# Patient Record
Sex: Female | Born: 1940 | Race: White | Hispanic: No | Marital: Married
Health system: Northeastern US, Academic
[De-identification: ages and names within clinical notes are randomized; demographics above are authoritative.]

## PROBLEM LIST (undated history)

## (undated) DIAGNOSIS — M549 Dorsalgia, unspecified: Secondary | ICD-10-CM

## (undated) DIAGNOSIS — M48061 Spinal stenosis, lumbar region without neurogenic claudication: Secondary | ICD-10-CM

## (undated) DIAGNOSIS — E210 Primary hyperparathyroidism: Secondary | ICD-10-CM

## (undated) DIAGNOSIS — K623 Rectal prolapse: Secondary | ICD-10-CM

## (undated) DIAGNOSIS — N200 Calculus of kidney: Secondary | ICD-10-CM

## (undated) DIAGNOSIS — N390 Urinary tract infection, site not specified: Secondary | ICD-10-CM

## (undated) DIAGNOSIS — K219 Gastro-esophageal reflux disease without esophagitis: Secondary | ICD-10-CM

## (undated) DIAGNOSIS — F329 Major depressive disorder, single episode, unspecified: Secondary | ICD-10-CM

## (undated) DIAGNOSIS — R079 Chest pain, unspecified: Secondary | ICD-10-CM

## (undated) DIAGNOSIS — R262 Difficulty in walking, not elsewhere classified: Secondary | ICD-10-CM

## (undated) DIAGNOSIS — F32A Depression, unspecified: Secondary | ICD-10-CM

## (undated) DIAGNOSIS — T4145XA Adverse effect of unspecified anesthetic, initial encounter: Secondary | ICD-10-CM

## (undated) DIAGNOSIS — T402X5A Adverse effect of other opioids, initial encounter: Secondary | ICD-10-CM

## (undated) DIAGNOSIS — R634 Abnormal weight loss: Secondary | ICD-10-CM

## (undated) DIAGNOSIS — K5903 Drug induced constipation: Secondary | ICD-10-CM

## (undated) DIAGNOSIS — G4700 Insomnia, unspecified: Secondary | ICD-10-CM

## (undated) DIAGNOSIS — R52 Pain, unspecified: Secondary | ICD-10-CM

## (undated) DIAGNOSIS — H269 Unspecified cataract: Secondary | ICD-10-CM

## (undated) DIAGNOSIS — M6281 Muscle weakness (generalized): Secondary | ICD-10-CM

## (undated) DIAGNOSIS — R064 Hyperventilation: Secondary | ICD-10-CM

## (undated) DIAGNOSIS — R0600 Dyspnea, unspecified: Secondary | ICD-10-CM

## (undated) DIAGNOSIS — F419 Anxiety disorder, unspecified: Secondary | ICD-10-CM

## (undated) DIAGNOSIS — M170 Bilateral primary osteoarthritis of knee: Secondary | ICD-10-CM

## (undated) DIAGNOSIS — R1310 Dysphagia, unspecified: Secondary | ICD-10-CM

## (undated) DIAGNOSIS — I499 Cardiac arrhythmia, unspecified: Secondary | ICD-10-CM

## (undated) DIAGNOSIS — Z87442 Personal history of urinary calculi: Secondary | ICD-10-CM

## (undated) DIAGNOSIS — I719 Aortic aneurysm of unspecified site, without rupture: Secondary | ICD-10-CM

## (undated) DIAGNOSIS — T8859XA Other complications of anesthesia, initial encounter: Secondary | ICD-10-CM

## (undated) HISTORY — DX: Unspecified cataract: H26.9

## (undated) HISTORY — DX: Insomnia, unspecified: G47.00

## (undated) HISTORY — PX: PARATHYROIDECTOMY: SHX19

## (undated) HISTORY — PX: TONSILLECTOMY: SUR1361

## (undated) HISTORY — DX: Calculus of kidney: N20.0

## (undated) HISTORY — PX: ABDOMINAL HYSTERECTOMY: SHX81

## (undated) HISTORY — DX: Muscle weakness (generalized): M62.81

## (undated) HISTORY — PX: JOINT REPLACEMENT: SHX530

## (undated) HISTORY — DX: Dysphagia, unspecified: R13.10

## (undated) HISTORY — DX: Depression, unspecified: F32.A

## (undated) HISTORY — PX: APPENDECTOMY: SHX54

## (undated) HISTORY — PX: TOTAL KNEE ARTHROPLASTY: SHX125

## (undated) HISTORY — DX: Dyspnea, unspecified: R06.00

## (undated) HISTORY — DX: Urinary tract infection, site not specified: N39.0

## (undated) HISTORY — DX: Anxiety disorder, unspecified: F41.9

## (undated) HISTORY — DX: Pain, unspecified: R52

## (undated) HISTORY — PX: SPINE SURGERY: SHX786

## (undated) HISTORY — DX: Dorsalgia, unspecified: M54.9

## (undated) HISTORY — PX: BREAST LUMPECTOMY: SHX2

## (undated) HISTORY — DX: Chest pain, unspecified: R07.9

## (undated) HISTORY — DX: Primary hyperparathyroidism: E21.0

## (undated) HISTORY — DX: Major depressive disorder, single episode, unspecified: F32.9

## (undated) HISTORY — DX: Spinal stenosis, lumbar region without neurogenic claudication: M48.061

## (undated) HISTORY — DX: Difficulty in walking, not elsewhere classified: R26.2

## (undated) HISTORY — DX: Abnormal weight loss: R63.4

## (undated) HISTORY — DX: Hyperventilation: R06.4

## (undated) HISTORY — DX: Bilateral primary osteoarthritis of knee: M17.0

## (undated) HISTORY — DX: Aortic aneurysm of unspecified site, without rupture: I71.9

## (undated) HISTORY — DX: Rectal prolapse: K62.3

## (undated) HISTORY — PX: ROTATOR CUFF REPAIR: SHX139

---

## 2002-11-06 LAB — HM DEXA SCAN: HM Dexa Scan: NORMAL

## 2008-03-16 ENCOUNTER — Encounter: Admission: RE | Admit: 2008-03-16 | Discharge: 2008-03-16 | Payer: Self-pay | Admitting: Orthopaedic Surgery

## 2008-03-23 ENCOUNTER — Encounter: Admission: RE | Admit: 2008-03-23 | Discharge: 2008-03-23 | Payer: Self-pay | Admitting: Orthopaedic Surgery

## 2008-04-28 ENCOUNTER — Encounter: Admission: RE | Admit: 2008-04-28 | Discharge: 2008-04-28 | Payer: Self-pay | Admitting: Orthopaedic Surgery

## 2008-05-14 ENCOUNTER — Encounter: Admission: RE | Admit: 2008-05-14 | Discharge: 2008-05-14 | Payer: Self-pay | Admitting: Orthopaedic Surgery

## 2008-11-25 ENCOUNTER — Emergency Department (HOSPITAL_COMMUNITY): Admission: EM | Admit: 2008-11-25 | Discharge: 2008-11-25 | Payer: Self-pay | Admitting: Emergency Medicine

## 2009-02-16 ENCOUNTER — Encounter (HOSPITAL_COMMUNITY): Admission: RE | Admit: 2009-02-16 | Discharge: 2009-04-01 | Payer: Self-pay | Admitting: Sports Medicine

## 2009-04-12 ENCOUNTER — Encounter (HOSPITAL_COMMUNITY): Admission: RE | Admit: 2009-04-12 | Discharge: 2009-05-12 | Payer: Self-pay | Admitting: Sports Medicine

## 2009-09-10 ENCOUNTER — Emergency Department (HOSPITAL_COMMUNITY): Admission: EM | Admit: 2009-09-10 | Discharge: 2009-09-10 | Payer: Self-pay | Admitting: Emergency Medicine

## 2009-10-09 ENCOUNTER — Inpatient Hospital Stay: Payer: Self-pay | Admitting: Specialist

## 2010-01-04 ENCOUNTER — Ambulatory Visit: Payer: Self-pay | Admitting: Specialist

## 2011-01-20 ENCOUNTER — Emergency Department (HOSPITAL_COMMUNITY)
Admission: EM | Admit: 2011-01-20 | Discharge: 2011-01-21 | Disposition: A | Discharge status: Home / Self Care | Payer: Medicare Other | Attending: Emergency Medicine | Admitting: Emergency Medicine

## 2011-01-20 DIAGNOSIS — J40 Bronchitis, not specified as acute or chronic: Secondary | ICD-10-CM | POA: Insufficient documentation

## 2011-01-20 DIAGNOSIS — R05 Cough: Secondary | ICD-10-CM | POA: Insufficient documentation

## 2011-01-20 DIAGNOSIS — Z79899 Other long term (current) drug therapy: Secondary | ICD-10-CM | POA: Insufficient documentation

## 2011-01-20 DIAGNOSIS — R059 Cough, unspecified: Secondary | ICD-10-CM | POA: Insufficient documentation

## 2011-01-20 DIAGNOSIS — F329 Major depressive disorder, single episode, unspecified: Secondary | ICD-10-CM | POA: Insufficient documentation

## 2011-01-20 DIAGNOSIS — J329 Chronic sinusitis, unspecified: Secondary | ICD-10-CM | POA: Insufficient documentation

## 2011-01-20 DIAGNOSIS — F3289 Other specified depressive episodes: Secondary | ICD-10-CM | POA: Insufficient documentation

## 2011-01-20 DIAGNOSIS — J3489 Other specified disorders of nose and nasal sinuses: Secondary | ICD-10-CM | POA: Insufficient documentation

## 2011-02-08 LAB — OVA AND PARASITE EXAMINATION: Ova and parasites: NONE SEEN

## 2011-02-08 LAB — CBC
HCT: 39.1 % (ref 36.0–46.0)
Hemoglobin: 13.4 g/dL (ref 12.0–15.0)
MCHC: 34.2 g/dL (ref 30.0–36.0)
MCV: 97.1 fL (ref 78.0–100.0)
RBC: 4.02 MIL/uL (ref 3.87–5.11)
WBC: 6.6 10*3/uL (ref 4.0–10.5)

## 2011-02-08 LAB — DIFFERENTIAL
Basophils Absolute: 0 10*3/uL (ref 0.0–0.1)
Eosinophils Absolute: 0.2 10*3/uL (ref 0.0–0.7)
Eosinophils Relative: 3 % (ref 0–5)
Lymphocytes Relative: 34 % (ref 12–46)
Lymphs Abs: 2.3 10*3/uL (ref 0.7–4.0)
Neutrophils Relative %: 54 % (ref 43–77)

## 2011-02-08 LAB — URINALYSIS, ROUTINE W REFLEX MICROSCOPIC
Bilirubin Urine: NEGATIVE
Glucose, UA: NEGATIVE mg/dL
Hgb urine dipstick: NEGATIVE
Ketones, ur: NEGATIVE mg/dL
Protein, ur: NEGATIVE mg/dL
Urobilinogen, UA: 0.2 mg/dL (ref 0.0–1.0)

## 2011-02-08 LAB — COMPREHENSIVE METABOLIC PANEL
AST: 17 U/L (ref 0–37)
CO2: 27 mEq/L (ref 19–32)
Calcium: 8.8 mg/dL (ref 8.4–10.5)
Chloride: 108 mEq/L (ref 96–112)
Creatinine, Ser: 0.73 mg/dL (ref 0.4–1.2)
GFR calc non Af Amer: >60 mL/min (ref >60)
Glucose, Bld: 85 mg/dL (ref 70–99)
Total Bilirubin: 0.4 mg/dL (ref 0.3–1.2)

## 2011-02-08 LAB — CLOSTRIDIUM DIFFICILE EIA: C difficile Toxins A+B, EIA: NEGATIVE

## 2011-02-08 LAB — STOOL CULTURE

## 2011-02-08 LAB — URINE CULTURE

## 2011-11-07 HISTORY — PX: COLONOSCOPY: SHX174

## 2011-12-25 ENCOUNTER — Ambulatory Visit: Payer: Self-pay | Admitting: Unknown Physician Specialty

## 2011-12-25 LAB — HM COLONOSCOPY: HM Colonoscopy: NORMAL

## 2011-12-26 LAB — PATHOLOGY REPORT

## 2012-08-25 ENCOUNTER — Emergency Department (HOSPITAL_COMMUNITY): Payer: Medicare Other

## 2012-08-25 ENCOUNTER — Emergency Department (HOSPITAL_COMMUNITY)
Admission: EM | Admit: 2012-08-25 | Discharge: 2012-08-25 | Disposition: A | Discharge status: Home / Self Care | Payer: Medicare Other | Attending: Emergency Medicine | Admitting: Emergency Medicine

## 2012-08-25 ENCOUNTER — Encounter (HOSPITAL_COMMUNITY): Payer: Self-pay | Admitting: Emergency Medicine

## 2012-08-25 DIAGNOSIS — S61209A Unspecified open wound of unspecified finger without damage to nail, initial encounter: Secondary | ICD-10-CM | POA: Insufficient documentation

## 2012-08-25 DIAGNOSIS — S61259A Open bite of unspecified finger without damage to nail, initial encounter: Secondary | ICD-10-CM

## 2012-08-25 DIAGNOSIS — IMO0001 Reserved for inherently not codable concepts without codable children: Secondary | ICD-10-CM | POA: Insufficient documentation

## 2012-08-25 DIAGNOSIS — Y92009 Unspecified place in unspecified non-institutional (private) residence as the place of occurrence of the external cause: Secondary | ICD-10-CM | POA: Insufficient documentation

## 2012-08-25 DIAGNOSIS — Z23 Encounter for immunization: Secondary | ICD-10-CM | POA: Insufficient documentation

## 2012-08-25 MED ORDER — AMOXICILLIN-POT CLAVULANATE 875-125 MG PO TABS: 1.0000 | ORAL_TABLET | Freq: Two times a day (BID) | ORAL | Status: DC

## 2012-08-25 MED ORDER — AMOXICILLIN-POT CLAVULANATE 875-125 MG PO TABS
1.0000 | ORAL_TABLET | Freq: Once | ORAL | Status: AC
  Administered 2012-08-25: 1 via ORAL
  Filled 2012-08-25: qty 1

## 2012-08-25 MED ORDER — TETANUS-DIPHTH-ACELL PERTUSSIS 5-2.5-18.5 LF-MCG/0.5 IM SUSP
0.5000 mL | Freq: Once | INTRAMUSCULAR | Status: AC
  Administered 2012-08-25: 0.5 mL via INTRAMUSCULAR
  Filled 2012-08-25: qty 0.5

## 2012-08-25 NOTE — ED Notes (Signed)
Patient was attempting to catch some cats to take them to the pound.  Patient was bitten on right thumb.

## 2012-08-25 NOTE — ED Provider Notes (Signed)
History     CSN: [id number]  Arrival date & time 08/25/12  2012   First MD Initiated Contact with Patient 08/25/12 2020      Chief Complaint  Patient presents with  . Animal Bite    (Consider location/radiation/quality/duration/timing/severity/associated sxs/prior treatment) HPI Comments: Bit while capturing a feral cat that lives in her barn.  date of last dT unknown.  R hand dominant   Patient is a 71 y.o. female presenting with animal bite. The history is provided by the patient. No language interpreter was used.  Animal Bite  The incident occurred just prior to arrival. The incident occurred at home. She came to the ER via personal transport. There is an injury to the right thumb. The pain is moderate. It is unknown if a foreign body is present. Pertinent negatives include no numbness, no focal weakness, no tingling and no weakness. There have been no prior injuries to these areas. Her tetanus status is out of date. There were no sick contacts. She has received no recent medical care.    History reviewed. No pertinent past medical history.  History reviewed. No pertinent past surgical history.  No family history on file.  History  Substance Use Topics  . Smoking status: Current Every Day Smoker  . Smokeless tobacco: Not on file  . Alcohol Use: No    OB History    Grav Para Term Preterm Abortions TAB SAB Ect Mult Living                  Review of Systems  Constitutional: Negative for fever and chills.  Skin:       Puncture wound to thumb   Neurological: Negative for tingling, focal weakness, weakness and numbness.  All other systems reviewed and are negative.    Allergies  Codeine; Fentanyl; and Sodium pentobarbital  Home Medications  No current outpatient prescriptions on file.  BP 137/71  Pulse 99  Temp 98.1 F (36.7 C) (Oral)  Resp 18  Ht 5\' 1"  (1.549 m)  Wt 165 lb (74.844 kg)  BMI 31.18 kg/m2  SpO2 100%  Physical Exam  Nursing note and  vitals reviewed. Constitutional: She is oriented to person, place, and time. She appears well-developed and well-nourished. No distress.  HENT:  Head: Normocephalic and atraumatic.  Eyes: EOM are normal.  Neck: Normal range of motion.  Cardiovascular: Normal rate and regular rhythm.   Pulmonary/Chest: Effort normal.  Abdominal: Soft. She exhibits no distension. There is no tenderness.  Musculoskeletal: She exhibits tenderness.       Hands: Neurological: She is alert and oriented to person, place, and time.  Skin: Skin is warm and dry.  Psychiatric: She has a normal mood and affect. Judgment normal.    ED Course  Procedures (including critical care time)  Labs Reviewed - No data to display No results found.   1. Animal bite of finger       MDM  rx-augmentin 875 mg , 14 Wash/abxoint BID F/u with your MD        Evalina Field, PA 08/25/12 2141

## 2012-08-26 NOTE — ED Provider Notes (Signed)
Medical screening examination/treatment/procedure(s) were performed by non-physician practitioner and as supervising physician I was immediately available for consultation/collaboration.   Carleene Cooper III, MD 08/26/12 [phone]

## 2013-06-23 ENCOUNTER — Encounter: Payer: Self-pay | Admitting: Internal Medicine

## 2013-06-23 ENCOUNTER — Ambulatory Visit (INDEPENDENT_AMBULATORY_CARE_PROVIDER_SITE_OTHER): Payer: Medicare Other | Admitting: Internal Medicine

## 2013-06-23 VITALS — BP 152/88 | HR 76 | Temp 98.4°F | Resp 14 | Ht 61.5 in | Wt 164.0 lb

## 2013-06-23 DIAGNOSIS — G4700 Insomnia, unspecified: Secondary | ICD-10-CM | POA: Insufficient documentation

## 2013-06-23 DIAGNOSIS — R109 Unspecified abdominal pain: Secondary | ICD-10-CM

## 2013-06-23 DIAGNOSIS — F3289 Other specified depressive episodes: Secondary | ICD-10-CM

## 2013-06-23 DIAGNOSIS — E538 Deficiency of other specified B group vitamins: Secondary | ICD-10-CM

## 2013-06-23 DIAGNOSIS — F32A Depression, unspecified: Secondary | ICD-10-CM | POA: Insufficient documentation

## 2013-06-23 DIAGNOSIS — F329 Major depressive disorder, single episode, unspecified: Secondary | ICD-10-CM

## 2013-06-23 DIAGNOSIS — E669 Obesity, unspecified: Secondary | ICD-10-CM | POA: Insufficient documentation

## 2013-06-23 DIAGNOSIS — M48061 Spinal stenosis, lumbar region without neurogenic claudication: Secondary | ICD-10-CM
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
## 2013-06-23 NOTE — Patient Instructions (Addendum)
Please bring your living will and HCPOA paperwork next visit.

## 2013-06-24 ENCOUNTER — Encounter: Payer: Self-pay | Admitting: *Deleted

## 2013-06-24 LAB — COMPREHENSIVE METABOLIC PANEL
ALT: 14 IU/L (ref 0–32)
AST: 21 IU/L (ref 0–40)
Albumin/Globulin Ratio: 1.8 (ref 1.1–2.5)
Albumin: 4.2 g/dL (ref 3.5–4.8)
Alkaline Phosphatase: 64 IU/L (ref 39–117)
BUN/Creatinine Ratio: 16 (ref 11–26)
BUN: 13 mg/dL (ref 8–27)
CO2: 28 mmol/L (ref 18–29)
Calcium: 9.6 mg/dL (ref 8.6–10.2)
Chloride: 102 mmol/L (ref 97–108)
Creatinine, Ser: 0.79 mg/dL (ref 0.57–1.00)
GFR calc Af Amer: 85 mL/min/{1.73_m2} (ref >59)
GFR calc non Af Amer: 74 mL/min/{1.73_m2} (ref >59)
Globulin, Total: 2.4 g/dL (ref 1.5–4.5)
Glucose: 89 mg/dL (ref 65–99)
Potassium: 4.9 mmol/L (ref 3.5–5.2)
Sodium: 143 mmol/L (ref 134–144)
Total Bilirubin: 0.2 mg/dL (ref 0.0–1.2)
Total Protein: 6.6 g/dL (ref 6.0–8.5)

## 2013-06-24 LAB — CBC WITH DIFFERENTIAL/PLATELET
Basophils Absolute: 0 10*3/uL (ref 0.0–0.2)
Basos: 1 % (ref 0–3)
Eos: 3 % (ref 0–5)
Eosinophils Absolute: 0.2 10*3/uL (ref 0.0–0.4)
HCT: 38.7 % (ref 34.0–46.6)
Hemoglobin: 13 g/dL (ref 11.1–15.9)
Immature Grans (Abs): 0 10*3/uL (ref 0.0–0.1)
Immature Granulocytes: 0 % (ref 0–2)
Lymphocytes Absolute: 2.1 10*3/uL (ref 0.7–3.1)
Lymphs: 36 % (ref 14–46)
MCH: 31.7 pg (ref 26.6–33.0)
MCHC: 33.6 g/dL (ref 31.5–35.7)
MCV: 94 fL (ref 79–97)
Monocytes Absolute: 0.4 10*3/uL (ref 0.1–0.9)
Monocytes: 7 % (ref 4–12)
Neutrophils Absolute: 3 10*3/uL (ref 1.4–7.0)
Neutrophils Relative %: 53 % (ref 40–74)
RBC: 4.1 x10E6/uL (ref 3.77–5.28)
RDW: 14.1 % (ref 12.3–15.4)
WBC: 5.7 10*3/uL (ref 3.4–10.8)

## 2013-06-24 LAB — B12 AND FOLATE PANEL
Folate: >19.9 ng/mL (ref >3.0)
Vitamin B-12: 624 pg/mL (ref 211–946)

## 2013-07-04 ENCOUNTER — Encounter: Payer: Self-pay | Admitting: Internal Medicine

## 2013-07-04 ENCOUNTER — Ambulatory Visit (INDEPENDENT_AMBULATORY_CARE_PROVIDER_SITE_OTHER): Payer: Medicare Other | Admitting: Internal Medicine

## 2013-07-04 VITALS — BP 144/80 | HR 80 | Temp 99.1°F | Ht 61.0 in | Wt 162.0 lb

## 2013-07-04 DIAGNOSIS — F329 Major depressive disorder, single episode, unspecified: Secondary | ICD-10-CM

## 2013-07-04 DIAGNOSIS — R0789 Other chest pain: Secondary | ICD-10-CM

## 2013-07-04 DIAGNOSIS — F3289 Other specified depressive episodes: Secondary | ICD-10-CM

## 2013-07-04 DIAGNOSIS — I10 Essential (primary) hypertension: Secondary | ICD-10-CM

## 2013-07-04 DIAGNOSIS — M48061 Spinal stenosis, lumbar region without neurogenic claudication: Secondary | ICD-10-CM

## 2013-07-04 DIAGNOSIS — F32A Depression, unspecified: Secondary | ICD-10-CM

## 2013-07-04 DIAGNOSIS — E669 Obesity, unspecified: Secondary | ICD-10-CM

## 2013-07-04 MED ORDER — HYDROCODONE-ACETAMINOPHEN 5-325 MG PO TABS: 2.0000 | ORAL_TABLET | Freq: Every day | ORAL | Status: DC | PRN

## 2013-07-04 NOTE — Progress Notes (Signed)
Patient ID: Brittany Mays, female   DOB: 10-Jul-1941, 72 y.o.   MRN: [id number] Location:  [facility]  Code status:  DNR  Allergies  Allergen Reactions  . Codeine   . Fentanyl     duragesic patch  . Nsaids   . Sodium Pentobarbital [Pentobarbital]     Chief Complaint  Patient presents with  . Hypertension    elevated b/p x several weeks  . Chest Heaviness    off/lon x 1 month or longer     HPI: Patient is a 72 y.o. female seen in the office today for chest heaviness off and on for over a month and high blood pressure for several weeks.  She has not had a prior history of high blood pressure.  Her father had CHF and COPD.    BP has been as high as the 190s systolic at times.  Has always had chest pressure off and on for a long time.  Has become more obvious.  Was attributing to stress and smoking, but decided to get it checked out.  Is under a lot of stress.  Is back to living with her mom.  Her husband just got told his prostate cancer was back.  Chest discomfort is pressure--not associated with eating, goes away spontaneously, nothing in particular makes it worse.  Not necessarily correlated with anxiety either.  A bit short of breath like she can't get a deep breath.    Has also been having left flank pain.  Was previously to see urology, Dr. Henri Medal CT next week--Wed.  Saw Michiel Cowboy, PA there.    Is fasting this am so she could get her cholesterol checked.   Brought somnapure.    EKG was done.   Does want to quit smoking, but the thought of doing it now while her husband is getting XRT for his prostate is too hard.    Would like to refill her hydrocodone for her foot and her low back.  Does not use consistently, just as needed.  She agrees to sign a narcotic contract with [place] today.  This will be scanned.  Review of Systems:  Review of Systems  Constitutional: Negative for diaphoresis.  Eyes: Negative for blurred vision.   Respiratory: Positive for shortness of breath. Negative for cough, sputum production and wheezing.        Mild dyspnea on exertion  Cardiovascular: Positive for chest pain. Negative for palpitations, orthopnea, claudication, leg swelling and PND.  Gastrointestinal: Negative for heartburn, abdominal pain and constipation.  Genitourinary: Positive for flank pain.  Musculoskeletal: Positive for back pain. Negative for falls.  Neurological: Negative for dizziness, speech change, focal weakness and headaches.  Psychiatric/Behavioral: Positive for depression. Negative for memory loss. The patient is nervous/anxious.      Past Medical History  Diagnosis Date  . Depression   . Anxiety   . Insomnia   . Pain   . Cataract   . Osteoarthritis of both knees     s/p knee replacements  . Lumbar spinal stenosis     s/p laminectomy  . Hyperparathyroidism, primary     s/p parathyroidectomy  . Rectal prolapse     s/p repair    Past Surgical History  Procedure Laterality Date  . Abdominal hysterectomy    . Knee surgery Bilateral     Knee Replacements  . Spine surgery    . Parathyroidectomy    . Colonoscopy  2013    Social  History:   reports that she has been smoking Cigarettes.  She has a 25 pack-year smoking history. She does not have any smokeless tobacco history on file. She reports that she does not drink alcohol or use illicit drugs.  Family History  Problem Relation Age of Onset  . COPD Father   . Heart failure Father   . Diabetes Son    Medications: Patient's Medications  New Prescriptions   No medications on file  Previous Medications   CALCIUM-VITAMIN D (OSCAL WITH D) 500-200 MG-UNIT PER TABLET    Take 1 tablet by mouth.   FISH OIL-OMEGA-3 FATTY ACIDS 1000 MG CAPSULE    Take 2 g by mouth daily.   HYDROCODONE-ACETAMINOPHEN (NORCO/VICODIN) 5-325 MG PER TABLET    Take two tablets once daily as needed for pain   MULTIPLE VITAMINS-MINERALS (WOMENS MULTI) CAPS    Take one  tablet once daily   RED YEAST RICE EXTRACT (RED YEAST RICE PO)    Take by mouth daily.   VENLAFAXINE HCL 225 MG TB24    Take one tablet once daily   VITAMIN B-12 (CYANOCOBALAMIN) 100 MCG TABLET    Take 50 mcg by mouth daily.  Modified Medications   No medications on file  Discontinued Medications   No medications on file   Physical Exam: Filed Vitals:   07/04/13 0817  BP: 144/80  Pulse: 80  Temp: 99.1 F (37.3 C)  TempSrc: Oral  Height: 5\' 1"  (1.549 m)  Weight: 162 lb (73.483 kg)  SpO2: 93%  Physical Exam  Constitutional: She is oriented to person, place, and time. She appears well-developed and well-nourished.  Overweight white female  HENT:  Head: Normocephalic and atraumatic.  Eyes: EOM are normal. Pupils are equal, round, and reactive to light.  Neck: Neck supple.  Cardiovascular: Normal rate, regular rhythm, normal heart sounds and intact distal pulses.   No murmur heard. No reproducible chest pressure  Pulmonary/Chest: Effort normal and breath sounds normal. No respiratory distress.  Pox 93% only  Abdominal: Soft. Bowel sounds are normal. She exhibits no distension. There is no tenderness.  Musculoskeletal: Normal range of motion.  Neurological: She is alert and oriented to person, place, and time.  Skin: Skin is warm and dry.    Labs reviewed: Basic Metabolic Panel:  Recent Labs  16/10/96 1137  NA 143  K 4.9  CL 102  CO2 28  GLUCOSE 89  BUN 13  CREATININE 0.79  CALCIUM 9.6   Liver Function Tests:  Recent Labs  06/23/13 1137  AST 21  ALT 14  ALKPHOS 64  BILITOT 0.2  PROT 6.6  CBC:  Recent Labs  06/23/13 1137  WBC 5.7  NEUTROABS 3.0  HGB 13.0  HCT 38.7  MCV 94   Assessment/Plan 1. Chest heaviness - EKG 12-Lead--normal sinus rhythm, no acute ischemia or infarct identified - CT Angio Chest PE W/Cm &/Or Wo Cm; Future--will try to add on to imaging being done Wed at [facility] -check FLP (already ordered at last visit for  future draw) -I want to r/o PE or nodule/mass as cause and assess for emphysematous changes of her lungs on the CT -this may actually be stress-related, is anxious -if CT imaging negative, would schedule stress test if her pressure persists--seems atypical, however -smoking cessation encouraged, but she is not ready 2. Essential hypertension, benign -new onset--? Due to stress, pulmonary etiology -no evidence of acute infarct -bp here was satisfactory -will plan to change her antidepressant when she  is not dealing with her husband's acute illness 3. Depression -seems stressed at this time, cont effexor for now 4. Lumbar spinal stenosis -signed narcotic contract today -hydrocodone refilled--does not use on a daily basis (2 tabs typically if she does need it for her back pain and her foot pain) Labs/tests ordered:  CT chest PE protocol next Wed Next appt:   As scheduled

## 2013-07-05 LAB — LIPID PANEL
Chol/HDL Ratio: 3.9 ratio units (ref 0.0–4.4)
Cholesterol, Total: 221 mg/dL — ABNORMAL HIGH (ref 100–199)
HDL: 56 mg/dL (ref >39)
LDL Calculated: 115 mg/dL — ABNORMAL HIGH (ref 0–99)
Triglycerides: 251 mg/dL — ABNORMAL HIGH (ref 0–149)
VLDL Cholesterol Cal: 50 mg/dL — ABNORMAL HIGH (ref 5–40)

## 2013-07-08 ENCOUNTER — Telehealth: Payer: Self-pay

## 2013-07-08 NOTE — Telephone Encounter (Signed)
Updated dosage of Red Yeast Rice (refer to labs drawn on 07/04/2013)

## 2013-07-10 ENCOUNTER — Telehealth: Payer: Self-pay | Admitting: *Deleted

## 2013-07-10 NOTE — Telephone Encounter (Signed)
07/09/2013---CT Chest with Abdomen/Pelvis:  Some scattered areas of presumed atelectasis in the lungs. Some linear fibrosis is not excluded. No pulmonary embolism evident. No thoracic or abdominal aortic aneurysm. No aortic dissection evident. Is no adenopathy. Hepatic steatosis is present. There is no other significant abnormality of the abdomen and pelvis. Incidental note is made of density in the small bowel anterior to the left kidney on images 39 through 43. This could represent an ingested object including capsule or tablet. Correlate clinically.  Per Dr. Reed---No acute findings to explain abdomen pain/flank pain or chest pressure.  07/10/2013  5:00pm--Patient Notified.

## 2013-08-07 ENCOUNTER — Ambulatory Visit (INDEPENDENT_AMBULATORY_CARE_PROVIDER_SITE_OTHER): Payer: Medicare Other | Admitting: Internal Medicine

## 2013-08-07 ENCOUNTER — Encounter: Payer: Self-pay | Admitting: Internal Medicine

## 2013-08-07 VITALS — BP 118/68 | HR 92 | Temp 97.8°F | Resp 12 | Ht 61.5 in | Wt 165.0 lb

## 2013-08-07 DIAGNOSIS — E781 Pure hyperglyceridemia: Secondary | ICD-10-CM

## 2013-08-07 DIAGNOSIS — M48061 Spinal stenosis, lumbar region without neurogenic claudication: Secondary | ICD-10-CM

## 2013-08-07 DIAGNOSIS — R109 Unspecified abdominal pain: Secondary | ICD-10-CM

## 2013-08-07 DIAGNOSIS — F3289 Other specified depressive episodes: Secondary | ICD-10-CM

## 2013-08-07 DIAGNOSIS — F329 Major depressive disorder, single episode, unspecified: Secondary | ICD-10-CM

## 2013-08-07 DIAGNOSIS — I10 Essential (primary) hypertension: Secondary | ICD-10-CM

## 2013-08-07 MED ORDER — HYDROCODONE-ACETAMINOPHEN 5-325 MG PO TABS: 2.0000 | ORAL_TABLET | Freq: Every day | ORAL | Status: DC | PRN

## 2013-08-07 NOTE — Progress Notes (Signed)
Patient ID: Brittany Mays, female   DOB: 25-Nov-1940, 72 y.o.   MRN: [id number] Location:  [facility]  Code Status: DNR  Allergies  Allergen Reactions  . Codeine   . Fentanyl     duragesic patch  . Nsaids   . Sodium Pentobarbital [Pentobarbital]     Chief Complaint  Patient presents with  . Annual Exam    Complete physical exam, EKG completed 06/2013, No pap    HPI: Patient is a 72 y.o. white female seen in the office today for annual exam.  Pt. States that chest heaviness that she experienced last visit has resolved and has not experienced that pain in several weeks.  States that mood is about the same, husband is dealing with prostate cancer and continues getting his radiation treatment.   States that her back has been bothering her a lot. Bothers her more in the morning when she is trying to get up. States that she has had two back surgeries and does not want anymore. Uses the hydrocodone for pain relief.   Having left flank pain that occurs when she has to urinate, in the last two weeks she has had a pain in her groin that she thinks is related because it also occurs when she has to urinate. Has had imaging done that did not reveal anything significant. She has a cystoscopy planned for Oct. 23rd.  Pt. States she no longer gets mammograms because if they found something should would not want to do anything at her age.     Review of Systems:  Review of Systems  Constitutional: Negative for fever, chills, weight loss and malaise/fatigue.  HENT: Positive for hearing loss. Negative for congestion and sore throat.        Waiting for medicaid to pay for hearing aids.   Eyes: Negative for blurred vision, double vision, pain and redness.       States that sometimes when she turns to the left she will have double vision but it resolves quickly.  Respiratory: Positive for shortness of breath. Negative for cough.        With activity   Cardiovascular: Negative for chest pain and leg swelling.  Gastrointestinal: Negative for heartburn, nausea, vomiting, diarrhea, constipation and blood in stool.  Genitourinary: Positive for flank pain. Negative for dysuria, urgency, frequency and hematuria.       See HPI  Musculoskeletal: Positive for back pain and joint pain. Negative for falls.  Neurological: Positive for dizziness. Negative for headaches.  Psychiatric/Behavioral: Positive for depression. Negative for memory loss. The patient is nervous/anxious.        Stable     Past Medical History  Diagnosis Date  . Depression   . Anxiety   . Insomnia   . Pain   . Cataract   . Osteoarthritis of both knees     s/p knee replacements  . Lumbar spinal stenosis     s/p laminectomy  . Hyperparathyroidism, primary     s/p parathyroidectomy  . Rectal prolapse     s/p repair    Past Surgical History  Procedure Laterality Date  . Abdominal hysterectomy    . Knee surgery Bilateral     Knee Replacements  . Spine surgery    . Parathyroidectomy    . Colonoscopy  2013    Social History:   reports that she has been smoking Cigarettes.  She has a 25 pack-year smoking history. She does not have any  smokeless tobacco history on file. She reports that she does not drink alcohol or use illicit drugs.  Family History  Problem Relation Age of Onset  . COPD Father   . Heart failure Father   . Diabetes Son     Medications: Patient's Medications  New Prescriptions   No medications on file  Previous Medications   AMBULATORY NON FORMULARY MEDICATION    Medication Name: Somnapure (sleep aid) 1 by mouth at bedtime   CALCIUM-VITAMIN D (OSCAL WITH D) 500-200 MG-UNIT PER TABLET    Take 1 tablet by mouth.   FISH OIL-OMEGA-3 FATTY ACIDS 1000 MG CAPSULE    Take 2 g by mouth daily.   HYDROCODONE-ACETAMINOPHEN (NORCO/VICODIN) 5-325 MG PER TABLET    Take 2 tablets by mouth daily as needed for pain. Take two tablets once daily as needed for  pain   MULTIPLE VITAMINS-MINERALS (WOMENS MULTI) CAPS    Take one tablet once daily   RED YEAST RICE 600 MG TABS    Take 600 mg by mouth daily.   VENLAFAXINE HCL 225 MG TB24    Take one tablet once daily   VITAMIN B-12 (CYANOCOBALAMIN) 100 MCG TABLET    Take 50 mcg by mouth daily.  Modified Medications   No medications on file  Discontinued Medications   No medications on file   Physical Exam: Filed Vitals:   08/07/13 1342  BP: 118/68  Pulse: 92  Temp: 97.8 F (36.6 C)  TempSrc: Oral  Resp: 12  Height: 5' 1.5" (1.562 m)  Weight: 165 lb (74.844 kg)  SpO2: 96%  Physical Exam  Constitutional: She is oriented to person, place, and time. She appears well-developed and well-nourished.  HENT:  Head: Normocephalic.  Right Ear: External ear normal.  Left Ear: External ear normal.  Nose: Nose normal.  Mouth/Throat: Oropharynx is clear and moist.  Cerumen in bilateral ears  Eyes: Conjunctivae and EOM are normal. Pupils are equal, round, and reactive to light.  Neck: Normal range of motion. No JVD present. No tracheal deviation present. No thyromegaly present.  Cardiovascular: Normal rate, regular rhythm, normal heart sounds and intact distal pulses.   Pulmonary/Chest: Effort normal and breath sounds normal. Right breast exhibits no inverted nipple, no mass, no nipple discharge, no skin change and no tenderness. Left breast exhibits no inverted nipple, no mass, no nipple discharge, no skin change and no tenderness.  Abdominal: Soft. Bowel sounds are normal. She exhibits no distension and no mass. There is no tenderness.  Musculoskeletal: Normal range of motion.  Lymphadenopathy:    She has no cervical adenopathy.  Neurological: She is alert and oriented to person, place, and time. She has normal reflexes.  Skin: Skin is warm and dry.  Psychiatric: She has a normal mood and affect.     Labs reviewed: Basic Metabolic Panel:  Recent Labs  16/10/96 1137  NA 143  K 4.9  CL 102   CO2 28  GLUCOSE 89  BUN 13  CREATININE 0.79  CALCIUM 9.6   Liver Function Tests:  Recent Labs  06/23/13 1137  AST 21  ALT 14  ALKPHOS 64  BILITOT 0.2  PROT 6.6   CBC:  Recent Labs  06/23/13 1137  WBC 5.7  NEUTROABS 3.0  HGB 13.0  HCT 38.7  MCV 94   Lipid Panel:  Recent Labs  07/04/13 0921  HDL 56  LDLCALC 115*  TRIG 251*  CHOLHDL 3.9   Past Procedures: Refuses further mammograms, pap smears, colonoscopies--says she would  not want anything done about it anyway  Assessment/Plan 1. Depression -related to her husband's prostate cancer -doing ok today, has some anxiety -cont effexor b/c bp is ok  2. Lumbar spinal stenosis - HYDROcodone-acetaminophen (NORCO/VICODIN) 5-325 MG per tablet; Take 2 tablets by mouth daily as needed for pain. Take two tablets once daily as needed for pain  Dispense: 60 tablet; Refill: 0 - pain comes and goes, but pt does not want any more surgery   3. Flank pain -etiology not yet clear, but she has a h/o kidney stones -none were found on her latest ct abd/pelvis through urology, but she is now to get a cystoscopy  4. Essential hypertension, benign - bp controlled today, will cont to monitor  5. Hypertriglyceridemia -check labs again before next appointment: - CBC with Differential; Future - Comprehensive metabolic panel; Future - Lipid panel; Future--is watching her diet and walking more  Labs/tests ordered:  Cbc, cmp, flp Next appt:  3 mos

## 2013-09-15 ENCOUNTER — Other Ambulatory Visit: Payer: Self-pay | Admitting: Internal Medicine

## 2013-09-15 DIAGNOSIS — M48061 Spinal stenosis, lumbar region without neurogenic claudication: Secondary | ICD-10-CM

## 2013-09-15 MED ORDER — HYDROCODONE-ACETAMINOPHEN 5-325 MG PO TABS: 2.0000 | ORAL_TABLET | Freq: Every day | ORAL | Status: DC | PRN

## 2013-09-26 ENCOUNTER — Telehealth: Payer: Self-pay

## 2013-09-26 MED ORDER — VENLAFAXINE HCL ER 225 MG PO TB24: ORAL_TABLET | ORAL | Status: DC

## 2013-09-26 NOTE — Telephone Encounter (Signed)
Patient was in office with husband and requested rx for Vesicare (NOT ON MEDICATION LIST), patient did not have medication bottles with her. Dr.Reed please advise if ok to fill although not on med list, patient ok for this message to wait for Dr.Reed on Monday. Patient also requesting refill on Venlafaxine ER (RX PRINTED AND PLACED ON LEDGE)  **Patient would like rx's printed off and mailed to her for a 90 day supply**

## 2013-09-28 NOTE — Telephone Encounter (Signed)
Ok with both prescriptions.  I would like to try her on a different antidepressant but we'll wait until her next appointment.  I will sign the scripts on ledge in the am.

## 2013-09-29 ENCOUNTER — Other Ambulatory Visit: Payer: Self-pay | Admitting: *Deleted

## 2013-09-29 MED ORDER — SOLIFENACIN SUCCINATE 5 MG PO TABS: 5.0000 mg | ORAL_TABLET | Freq: Every day | ORAL | Status: DC

## 2013-09-29 NOTE — Telephone Encounter (Signed)
rx printed and sign. Then mailed to pt per their instructions.

## 2013-10-27 ENCOUNTER — Other Ambulatory Visit: Payer: Self-pay | Admitting: *Deleted

## 2013-10-27 DIAGNOSIS — M48061 Spinal stenosis, lumbar region without neurogenic claudication: Secondary | ICD-10-CM

## 2013-10-27 MED ORDER — HYDROCODONE-ACETAMINOPHEN 5-325 MG PO TABS: ORAL_TABLET | ORAL | Status: DC

## 2013-11-07 ENCOUNTER — Other Ambulatory Visit: Payer: Medicare Other

## 2013-11-11 ENCOUNTER — Other Ambulatory Visit: Payer: Medicare Other

## 2013-11-13 ENCOUNTER — Ambulatory Visit: Payer: Medicare Other | Admitting: Internal Medicine

## 2013-11-13 ENCOUNTER — Other Ambulatory Visit: Payer: Self-pay | Admitting: Internal Medicine

## 2013-11-13 DIAGNOSIS — F32A Depression, unspecified: Secondary | ICD-10-CM

## 2013-11-13 DIAGNOSIS — F329 Major depressive disorder, single episode, unspecified: Secondary | ICD-10-CM

## 2013-11-13 MED ORDER — ESCITALOPRAM OXALATE 20 MG PO TABS: 20.0000 mg | ORAL_TABLET | Freq: Every day | ORAL | Status: DC

## 2013-11-13 MED ORDER — ESCITALOPRAM OXALATE 10 MG PO TABS: ORAL_TABLET | ORAL | Status: DC

## 2013-11-20 ENCOUNTER — Telehealth: Payer: Self-pay | Admitting: *Deleted

## 2013-11-20 NOTE — Telephone Encounter (Signed)
Patient was in with her husband and discussed her at that time. Said that you changed her from Effexor and she tapered it down over 2 days time and then started on Lexapro. For the last 3 night she has had bad dreams and pain all over. Not able to sleep well and crying all the time. What should she do? Wants to know if she should just continue through it and bear it or should it be changed to something else? Please Advise.  Pharmacy: [facility]

## 2013-11-20 NOTE — Telephone Encounter (Signed)
I tapered her quickly b/c she did not have but a couple of pills left.  This is going to take some adjusting.  If she has suicidal or homicidal ideation, I recommend she go to the ED.  Is her husband doing ok with his transition?

## 2013-11-20 NOTE — Telephone Encounter (Signed)
Discussed with patient, patient verbalized understanding of Dr.Reed's response. Patient states her husband mentioned that he is just very depressed, patient then states " I guess we just gotta help each other and bully our way through this."   Patient aware I will inform Dr.Reed of her response and f/u if any additional advice or recommendations to be given

## 2013-11-20 NOTE — Telephone Encounter (Signed)
error 

## 2013-12-08 ENCOUNTER — Other Ambulatory Visit: Payer: Medicare Other

## 2013-12-09 ENCOUNTER — Other Ambulatory Visit: Payer: Medicare Other

## 2013-12-09 ENCOUNTER — Other Ambulatory Visit: Payer: Self-pay | Admitting: *Deleted

## 2013-12-09 DIAGNOSIS — M48061 Spinal stenosis, lumbar region without neurogenic claudication: Secondary | ICD-10-CM

## 2013-12-09 DIAGNOSIS — E781 Pure hyperglyceridemia: Secondary | ICD-10-CM

## 2013-12-09 MED ORDER — HYDROCODONE-ACETAMINOPHEN 5-325 MG PO TABS: ORAL_TABLET | ORAL | Status: DC

## 2013-12-10 LAB — CBC WITH DIFFERENTIAL/PLATELET
Basophils Absolute: 0.1 10*3/uL (ref 0.0–0.2)
Basos: 2 %
Eos: 4 %
Eosinophils Absolute: 0.2 10*3/uL (ref 0.0–0.4)
HCT: 38.1 % (ref 34.0–46.6)
Hemoglobin: 12.9 g/dL (ref 11.1–15.9)
Immature Grans (Abs): 0 10*3/uL (ref 0.0–0.1)
Immature Granulocytes: 0 %
Lymphocytes Absolute: 1.7 10*3/uL (ref 0.7–3.1)
Lymphs: 37 %
MCH: 32 pg (ref 26.6–33.0)
MCHC: 33.9 g/dL (ref 31.5–35.7)
MCV: 95 fL (ref 79–97)
Monocytes Absolute: 0.4 10*3/uL (ref 0.1–0.9)
Monocytes: 9 %
Neutrophils Absolute: 2.2 10*3/uL (ref 1.4–7.0)
Neutrophils Relative %: 48 %
RBC: 4.03 x10E6/uL (ref 3.77–5.28)
RDW: 13.8 % (ref 12.3–15.4)
WBC: 4.7 10*3/uL (ref 3.4–10.8)

## 2013-12-10 LAB — COMPREHENSIVE METABOLIC PANEL
ALT: 12 IU/L (ref 0–32)
AST: 23 IU/L (ref 0–40)
Albumin/Globulin Ratio: 2.2 (ref 1.1–2.5)
Albumin: 4.3 g/dL (ref 3.5–4.8)
Alkaline Phosphatase: 57 IU/L (ref 39–117)
BUN/Creatinine Ratio: 17 (ref 11–26)
BUN: 12 mg/dL (ref 8–27)
CO2: 27 mmol/L (ref 18–29)
Calcium: 9.3 mg/dL (ref 8.7–10.3)
Chloride: 104 mmol/L (ref 97–108)
Creatinine, Ser: 0.69 mg/dL (ref 0.57–1.00)
GFR calc Af Amer: 99 mL/min/{1.73_m2} (ref >59)
GFR calc non Af Amer: 86 mL/min/{1.73_m2} (ref >59)
Globulin, Total: 2 g/dL (ref 1.5–4.5)
Glucose: 98 mg/dL (ref 65–99)
Potassium: 4.7 mmol/L (ref 3.5–5.2)
Sodium: 145 mmol/L — ABNORMAL HIGH (ref 134–144)
Total Bilirubin: 0.3 mg/dL (ref 0.0–1.2)
Total Protein: 6.3 g/dL (ref 6.0–8.5)

## 2013-12-10 LAB — LIPID PANEL
Chol/HDL Ratio: 3.5 ratio units (ref 0.0–4.4)
Cholesterol, Total: 214 mg/dL — ABNORMAL HIGH (ref 100–199)
HDL: 62 mg/dL (ref >39)
LDL Calculated: 115 mg/dL — ABNORMAL HIGH (ref 0–99)
Triglycerides: 185 mg/dL — ABNORMAL HIGH (ref 0–149)
VLDL Cholesterol Cal: 37 mg/dL (ref 5–40)

## 2013-12-15 ENCOUNTER — Encounter: Payer: Self-pay | Admitting: Internal Medicine

## 2013-12-15 ENCOUNTER — Ambulatory Visit (INDEPENDENT_AMBULATORY_CARE_PROVIDER_SITE_OTHER): Payer: Medicare Other | Admitting: Internal Medicine

## 2013-12-15 VITALS — BP 118/72 | HR 77 | Resp 10 | Wt 168.0 lb

## 2013-12-15 DIAGNOSIS — M7072 Other bursitis of hip, left hip: Secondary | ICD-10-CM

## 2013-12-15 DIAGNOSIS — M48061 Spinal stenosis, lumbar region without neurogenic claudication: Secondary | ICD-10-CM

## 2013-12-15 DIAGNOSIS — F32A Depression, unspecified: Secondary | ICD-10-CM

## 2013-12-15 DIAGNOSIS — M76899 Other specified enthesopathies of unspecified lower limb, excluding foot: Secondary | ICD-10-CM

## 2013-12-15 DIAGNOSIS — E781 Pure hyperglyceridemia: Secondary | ICD-10-CM

## 2013-12-15 DIAGNOSIS — E669 Obesity, unspecified: Secondary | ICD-10-CM

## 2013-12-15 DIAGNOSIS — I10 Essential (primary) hypertension: Secondary | ICD-10-CM

## 2013-12-15 DIAGNOSIS — F3289 Other specified depressive episodes: Secondary | ICD-10-CM

## 2013-12-15 DIAGNOSIS — F329 Major depressive disorder, single episode, unspecified: Secondary | ICD-10-CM

## 2013-12-15 DIAGNOSIS — Z23 Encounter for immunization: Secondary | ICD-10-CM

## 2013-12-15 MED ORDER — ZOSTER VACCINE LIVE 19400 UNT/0.65ML ~~LOC~~ SOLR: 0.6500 mL | Freq: Once | SUBCUTANEOUS | Status: DC

## 2013-12-15 NOTE — Progress Notes (Signed)
Patient ID: Brittany Mays, female   DOB: 07/16/1941, 73 y.o.   MRN: [id number]   Location:  [facility]  Allergies  Allergen Reactions  . Codeine   . Fentanyl     duragesic patch  . Nsaids   . Sodium Pentobarbital [Pentobarbital]     Chief Complaint  Patient presents with  . Medical Managment of Chronic Issues    3 month follow-up, discuss labs (copy printed)   . Back Pain    Lower back pain is getting worse, pain also radiates into left hip     HPI: Patient is a 73 y.o. white female seen in the office today for medical mgt of chronic diseases and c/o low back pain that radiates to her left hip.  Back is giving her an awful time and hydrocodone isn't even doing much of anything for it.  Has had two back surgeries previously.  Left hip is hurting also.  Thinks she might be walking funny from the left foot being sore from the accident.  Pain is so bad when she wakes up in the morning that she questions if she will be able to walk.    Is doing red yeast rice for her cholesterol and there has been an improvement.  Hasn't been able to exercise much due to foot and back pain.  Is going to try to do water aerobics.  Weight up 3# right now.    Mood:  lexapro replaced effexor due to insurance change.  Is very sad due to close friend having terminal cancer.  Is also worried about husband forgetting things--she says he is always forgetting things and she is concerned he is developing dementia.    Saw urology--tried vesicare, tried myrbetriq--neither really did much.  Might try electrical stimulation in April.  Gets woken up at night to urinate.    Still smoking.  Might be getting closer.  If things would slow down in life, she might be able to quit.    Review of Systems:  Review of Systems  Constitutional: Positive for malaise/fatigue. Negative for fever and chills.  Eyes: Negative for blurred vision.  Respiratory: Positive for cough. Negative for  shortness of breath.   Cardiovascular: Negative for chest pain and leg swelling.  Gastrointestinal: Positive for constipation. Negative for abdominal pain, blood in stool and melena.  Genitourinary: Positive for urgency and frequency. Negative for dysuria.       Nocturia  Musculoskeletal: Negative for falls.  Neurological: Negative for focal weakness, loss of consciousness, weakness and headaches.  Endo/Heme/Allergies: Does not bruise/bleed easily.  Psychiatric/Behavioral: Positive for depression. Negative for memory loss. The patient does not have insomnia.        High stress     Past Medical History  Diagnosis Date  . Depression   . Anxiety   . Insomnia   . Pain   . Cataract   . Osteoarthritis of both knees     s/p knee replacements  . Lumbar spinal stenosis     s/p laminectomy  . Hyperparathyroidism, primary     s/p parathyroidectomy  . Rectal prolapse     s/p repair    Past Surgical History  Procedure Laterality Date  . Abdominal hysterectomy    . Knee surgery Bilateral     Knee Replacements  . Spine surgery    . Parathyroidectomy    . Colonoscopy  2013    Social History:   reports that she has been smoking  Cigarettes.  She has a 50 pack-year smoking history. She does not have any smokeless tobacco history on file. She reports that she does not drink alcohol or use illicit drugs.  Family History  Problem Relation Age of Onset  . COPD Father   . Heart failure Father   . Diabetes Son     Medications: Patient's Medications  New Prescriptions   No medications on file  Previous Medications   AMBULATORY NON FORMULARY MEDICATION    Medication Name: Somnapure (sleep aid) 2 by mouth at bedtime   CALCIUM-VITAMIN D (OSCAL WITH D) 500-200 MG-UNIT PER TABLET    Take 1 tablet by mouth.   FISH OIL-OMEGA-3 FATTY ACIDS 1000 MG CAPSULE    Take 2 g by mouth daily.   HYDROCODONE-ACETAMINOPHEN (NORCO/VICODIN) 5-325 MG PER TABLET    Take two tablets once daily as needed for  pain   MULTIPLE VITAMINS-MINERALS (WOMENS MULTI) CAPS    Take one tablet once daily   RED YEAST RICE 600 MG TABS    Take 600 mg by mouth daily.   VITAMIN B-12 (CYANOCOBALAMIN) 100 MCG TABLET    Take 50 mcg by mouth daily.  Modified Medications   No medications on file  Discontinued Medications   ESCITALOPRAM (LEXAPRO) 10 MG TABLET    One tablet daily for 2 weeks, then 2 daily thereafter   ESCITALOPRAM (LEXAPRO) 20 MG TABLET    Take 1 tablet (20 mg total) by mouth daily.   SOLIFENACIN (VESICARE) 5 MG TABLET    Take 1 tablet (5 mg total) by mouth daily. Take 1 tablet by mouth once daily.   SOLIFENACIN (VESICARE) 5 MG TABLET    Take 1 tablet (5 mg total) by mouth daily.     Physical Exam: Filed Vitals:   12/15/13 1101  BP: 118/72  Pulse: 77  Resp: 10  Weight: 168 lb (76.204 kg)  SpO2: 96%  Physical Exam  Constitutional: She is oriented to person, place, and time. She appears well-developed and well-nourished. No distress.  Cardiovascular: Normal rate, regular rhythm, normal heart sounds and intact distal pulses.   Pulmonary/Chest: Breath sounds normal. No respiratory distress.  Abdominal: Soft. Bowel sounds are normal. She exhibits no distension and no mass. There is no tenderness.  Musculoskeletal: Normal range of motion. She exhibits no edema.  Tenderness over greater trochanter of left hip  Neurological: She is alert and oriented to person, place, and time. She has normal reflexes. No cranial nerve deficit.  Skin: Skin is warm and dry.  Psychiatric: She has a normal mood and affect.    Labs reviewed: Basic Metabolic Panel:  Recent Labs  06/23/13 1137 12/09/13 0843  NA 143 145*  K 4.9 4.7  CL 102 104  CO2 28 27  GLUCOSE 89 98  BUN 13 12  CREATININE 0.79 0.69  CALCIUM 9.6 9.3   Liver Function Tests:  Recent Labs  06/23/13 1137 12/09/13 0843  AST 21 23  ALT 14 12  ALKPHOS 64 57  BILITOT 0.2 0.3  PROT 6.6 6.3  CBC:  Recent Labs  06/23/13 1137  12/09/13 0843  WBC 5.7 4.7  NEUTROABS 3.0 2.2  HGB 13.0 12.9  HCT 38.7 38.1  MCV 94 95   Lipid Panel:  Recent Labs  07/04/13 0921 12/09/13 0843  HDL 56 62  LDLCALC 115* 115*  TRIG 251* 185*  CHOLHDL 3.9 3.5   Assessment/Plan  1. Hip bursitis, left - DG Hip Complete Left; Future -cont use of hydrocodone for the  time being--depending on xray results, may benefit from seeing orthopedics for injection 2. Lumbar spinal stenosis -recently worse -encouraged water aerobics to help -cont hydrocodone--if not improved with address hip problem, may need to increase pain medication or switch 3. Depression -has a lot of stress as in hpi, but seems to be coping fairly well on the lexapro -still smoking--given brochure for cessation class when she is ready 4. Essential hypertension, benign -bp at goal 5. Hypertriglyceridemia -improved with red yeast rice and fish oil--cont these, cannot tolerate statin therapy - Lipid panel; Future - Basic metabolic panel; Future 6. Obesity, unspecified -encouraged water aerobics to increase activity for weight loss, has made some dietary changes 7. Need for zoster vaccine - script sent for zoster vaccine live, PF, (ZOSTAVAX) 10315 UNT/0.65ML injection; Inject 19,400 Units into the skin once.  Dispense: 1 each; Refill: 0  Labs/tests ordered:   Orders Placed This Encounter  Procedures  . DG Hip Complete Left    Standing Status: Future     Number of Occurrences:      Standing Expiration Date: 02/13/2015    Order Specific Question:  Reason for Exam (SYMPTOM  OR DIAGNOSIS REQUIRED)    Answer:  left hip pain over bursa, hurts to rest on that side    Order Specific Question:  Preferred imaging location?    Answer:  [facility]  . Lipid panel    Standing Status: Future     Number of Occurrences:      Standing Expiration Date: 06/14/2014    Order Specific Question:  Has the patient fasted?    Answer:  Yes  . Basic metabolic panel    Standing  Status: Future     Number of Occurrences:      Standing Expiration Date: 06/14/2014    Order Specific Question:  Has the patient fasted?    Answer:  Yes    Next appt:  3 mos, but will see sooner if needed

## 2013-12-29 ENCOUNTER — Ambulatory Visit
Admission: RE | Admit: 2013-12-29 | Discharge: 2013-12-29 | Disposition: A | Discharge status: Home / Self Care | Payer: Medicare Other | Source: Ambulatory Visit | Attending: Internal Medicine | Admitting: Internal Medicine

## 2013-12-29 ENCOUNTER — Other Ambulatory Visit: Payer: Self-pay | Admitting: Internal Medicine

## 2013-12-29 DIAGNOSIS — F32A Depression, unspecified: Secondary | ICD-10-CM

## 2013-12-29 DIAGNOSIS — M7072 Other bursitis of hip, left hip: Secondary | ICD-10-CM

## 2013-12-29 DIAGNOSIS — F329 Major depressive disorder, single episode, unspecified: Secondary | ICD-10-CM

## 2013-12-29 MED ORDER — VENLAFAXINE HCL ER 150 MG PO CP24: 150.0000 mg | ORAL_CAPSULE | Freq: Every day | ORAL | Status: DC

## 2014-01-01 ENCOUNTER — Other Ambulatory Visit: Payer: Self-pay | Admitting: *Deleted

## 2014-01-01 DIAGNOSIS — F329 Major depressive disorder, single episode, unspecified: Secondary | ICD-10-CM

## 2014-01-01 DIAGNOSIS — F32A Depression, unspecified: Secondary | ICD-10-CM

## 2014-01-01 MED ORDER — VENLAFAXINE HCL ER 150 MG PO CP24: 150.0000 mg | ORAL_CAPSULE | Freq: Every day | ORAL | Status: DC

## 2014-01-01 NOTE — Telephone Encounter (Signed)
Faxed Rx Request to [facility] for mail order. Received fax from [facility].

## 2014-01-07 ENCOUNTER — Other Ambulatory Visit: Payer: Self-pay | Admitting: *Deleted

## 2014-01-07 DIAGNOSIS — M25559 Pain in unspecified hip: Secondary | ICD-10-CM

## 2014-01-12 ENCOUNTER — Other Ambulatory Visit: Payer: Medicare Other

## 2014-01-12 ENCOUNTER — Other Ambulatory Visit: Payer: Self-pay | Admitting: *Deleted

## 2014-01-12 DIAGNOSIS — M48061 Spinal stenosis, lumbar region without neurogenic claudication: Secondary | ICD-10-CM

## 2014-01-12 MED ORDER — HYDROCODONE-ACETAMINOPHEN 5-325 MG PO TABS: ORAL_TABLET | ORAL | Status: DC

## 2014-02-10 ENCOUNTER — Ambulatory Visit (HOSPITAL_COMMUNITY)
Admission: RE | Admit: 2014-02-10 | Discharge: 2014-02-10 | Disposition: A | Discharge status: Home / Self Care | Payer: Medicare Other | Source: Ambulatory Visit | Attending: Orthopaedic Surgery | Admitting: Orthopaedic Surgery

## 2014-02-10 DIAGNOSIS — M6281 Muscle weakness (generalized): Secondary | ICD-10-CM | POA: Insufficient documentation

## 2014-02-10 DIAGNOSIS — IMO0001 Reserved for inherently not codable concepts without codable children: Secondary | ICD-10-CM | POA: Insufficient documentation

## 2014-02-10 DIAGNOSIS — R269 Unspecified abnormalities of gait and mobility: Secondary | ICD-10-CM | POA: Insufficient documentation

## 2014-02-10 DIAGNOSIS — R262 Difficulty in walking, not elsewhere classified: Secondary | ICD-10-CM | POA: Insufficient documentation

## 2014-02-10 DIAGNOSIS — M25559 Pain in unspecified hip: Secondary | ICD-10-CM | POA: Insufficient documentation

## 2014-02-10 HISTORY — DX: Muscle weakness (generalized): M62.81

## 2014-02-10 HISTORY — DX: Difficulty in walking, not elsewhere classified: R26.2

## 2014-02-10 NOTE — Evaluation (Signed)
Physical Therapy Evaluation  Patient Details  Name: Brittany Mays MRN: [id number] Date of Birth: 08/06/1941  Today's Date: 02/10/2014 Time: 0930-1015 PT Time Calculation (min): 45 min 1 Evaluation, manual therapy [phone], There Ex [id number]               Visit#: 1 of 16  Re-eval: 03/12/14 Assessment Diagnosis: Difficulty walking secodnary to Lt hip weakness, pain, and decrease foot and hip mobility.  Prior Therapy: no  Authorization: Medicare    Authorization Time Period:    Authorization Visit#: 1 of 16   Past Medical History:  Past Medical History  Diagnosis Date  . Depression   . Anxiety   . Insomnia   . Pain   . Cataract   . Osteoarthritis of both knees     s/p knee replacements  . Lumbar spinal stenosis     s/p laminectomy  . Hyperparathyroidism, primary     s/p parathyroidectomy  . Rectal prolapse     s/p repair   Past Surgical History:  Past Surgical History  Procedure Laterality Date  . Abdominal hysterectomy    . Knee surgery Bilateral     Knee Replacements  . Spine surgery    . Parathyroidectomy    . Colonoscopy  2013    Subjective Symptoms/Limitations Symptoms: L hip pain, and developing Rt hip pain. no N/T , muscle cramps in calfs,  Pertinent History: 02/03/14 Injection into right hip for pain. Lt hipp pain for 2 months. X-rays revealed bone spurs and arthritis. History of bilateral knee replacements, Rt 20 years ago, Lt 10 years ago. 2 back surgeries in 1990's: fusion at L4-L5. "all of this was my own fault as a result of downhill skiing anmd horseback riding. 4 years ago patient injured Lt ankle  resulting in ankle fracture that has since healed.  Smoking 1/2 a pack a day.  Limitations: Walking How long can you walk comfortably?: < 5 minutes.  Repetition: Increases Symptoms Pain Assessment Currently in Pain?: Yes Pain Score: 2  Pain Location: Hip Pain Orientation: Left Pain Onset: More than a month ago Effect of Pain on Daily Activities:  twistign and walking. Gardening  Precautions/Restrictions     Balance Screening    Prior Functioning  Prior Function Level of Independence: Independent with basic ADLs  Cognition/Observation Cognition Overall Cognitive Status: Within Functional Limits for tasks assessed  Sensation/Coordination/Flexibility/Functional Tests Sensation Light Touch: Appears Intact Stereognosis: Appears Intact Hot/Cold: Appears Intact Proprioception: Appears Intact Coordination Gross Motor Movements are Fluid and Coordinated: Yes Flexibility Thomas: Positive Obers: Positive Functional Tests Functional Tests: Squat: patient is unable to weight shift to Lt LE during squat indicating Lt heip weakness Functional Tests: Walking: patient ambulates with limited hip extension, excessive knee valgus(L>R) andexcessive medial foot weightbearing with no to little lateral foot weightbearing in foot flat phase.   Assessment RLE AROM (degrees) Right Hip Extension: 0 Right Hip Flexion: 100 Right Hip ABduction: 30 Right Hip ADduction: 15 Right Ankle Dorsiflexion: 12 RLE Strength Right Hip Flexion: 4/5 Right Hip Extension: 2/5 Right Hip External Rotation : 50 Right Hip Internal Rotation : 35 Right Hip ABduction: 2+/5 Right Hip ADduction: 4/5 LLE AROM (degrees) Left Hip Extension: 0 Left Hip Flexion: 100 Left Hip External Rotation : 39 Left Hip Internal Rotation : 25 Left Hip ABduction: 30 Left Hip ADduction: 15 Left Ankle Dorsiflexion: 10 LLE Strength Left Hip Flexion: 4/5 Left Hip Extension: 2/5 Left Hip ABduction: 3-/5 Left Hip ADduction: 3+/5 Left Ankle Dorsiflexion: 4/5  Exercise/Treatments Stretches [employer]  Stretch:  (10x 3 seconds) Hip Flexor Stretch: 1 rep;60 seconds Gastroc Stretch:  (10x 3sec) Sidelying Hip ABduction: AROM;Strengthening;1 set;10 reps (straight leg raise)  Manual Therapy Manual Therapy: Myofascial release Myofascial Release: Glut med/min, piriformis, and TFL  84min  Physical Therapy Assessment and Plan PT Assessment and Plan Clinical Impression Statement: Patient displays Lt hip pain secondary to abnormal gait due to limited hip and ankle mobility and limited hip strength. Specifically Patient displays significant myofascial mobility limitation in glut med/min and TFL that improved following manual release with patient noting improved pain, though these muscles continued to show significant weakness.  Patient will benefit from skilled PT to address the above listed limitations and  progress functional strength so she can return to walking and working in her garden without pain. Pt will benefit from skilled therapeutic intervention in order to improve on the following deficits: Abnormal gait;Decreased activity tolerance;Decreased balance;Decreased range of motion;Decreased mobility;Decreased strength;Impaired flexibility;Increased muscle spasms PT Frequency: Min 2X/week PT Duration: 8 weeks PT Treatment/Interventions: Gait training;Functional mobility training;Therapeutic activities;Therapeutic exercise;Balance training;Manual techniques;Patient/family education PT Plan: Assess posture and Low back. Review HEP (calf, quad, hip flexor, Great toe, and piriformis stretches) initialize glut strengthening via squat reach matrix. to a raised surface, and side laying straight leg raise    Goals Home Exercise Program Pt/caregiver will Perform Home Exercise Program: For increased ROM;For increased strengthening PT Goal: Perform Home Exercise Program - Progress: Goal set today PT Short Term Goals Time to Complete Short Term Goals: 4 weeks PT Short Term Goal 1: Patient will display improved dorsiflexion ROM to 20 degrees bilaterally so patient more easying squat/kneel to the ground to work in her garden PT Short Term Goal 2: Patient will display increased hip extension AROM to 15 degrees and increased great toe dorsiflexion to 50 degrees to improve stride length  durign gait.  PT Short Term Goal 3: Patient will display increased hip IR to 40 degrees to improve patient ability to absorb the impact at foot strike during gait.  PT Long Term Goals Time to Complete Long Term Goals: 8 weeks PT Long Term Goal 1: Patient will demosntrate 4+/5 glut medius strength indicating improved abilit to stand on one leg for increased standing balance.  PT Long Term Goal 2: Patient will be able to stand from half kneeling without pain so she can return to working in her garden.  Long Term Goal 3: Patient will have 0/10 pain in Lt hip with no report of pain following working in her garden or walk >15minutes.  Problem List Patient Active Problem List   Diagnosis Date Noted  . Difficulty in walking(719.7) 02/10/2014  . Decreased muscle strength 02/10/2014  . Obesity (BMI 30-39.9) 06/23/2013  . Depression   . Lumbar spinal stenosis   . Insomnia     PT - End of Session Activity Tolerance: Patient tolerated treatment well General Behavior During Therapy: WFL for tasks assessed/performed PT Plan of Care PT Home Exercise Plan: calf, quad, hip flexor, Great toe, and piriformis stretches 10x 3 seconds twice daily Consulted and Agree with Plan of Care: Patient  GP Functional Assessment Tool Used: FOTO Functional Limitation: Mobility: Walking and moving around Mobility: Walking and Moving Around Current Status (Z6109): At least 60 percent but less than 80 percent impaired, limited or restricted Mobility: Walking and Moving Around Goal Status [phone]): At least 40 percent but less than 60 percent impaired, limited or restricted  Leia Alf PT DPT 02/10/2014, 11:51 AM  Physician Documentation Your signature is  required to indicate approval of the treatment plan as stated above.  Please sign and either send electronically or make a copy of this report for your files and return this physician signed original.   Please mark one 1.__approve of plan  2. ___approve of plan  with the following conditions.   ______________________________                                                          _____________________ Physician Signature                                                                                                             Date

## 2014-02-12 ENCOUNTER — Telehealth (HOSPITAL_COMMUNITY): Payer: Self-pay

## 2014-02-12 ENCOUNTER — Inpatient Hospital Stay (HOSPITAL_COMMUNITY): Admission: RE | Admit: 2014-02-12 | Payer: Medicare Other | Source: Ambulatory Visit | Admitting: *Deleted

## 2014-02-16 ENCOUNTER — Other Ambulatory Visit: Payer: Self-pay | Admitting: *Deleted

## 2014-02-16 DIAGNOSIS — M48061 Spinal stenosis, lumbar region without neurogenic claudication: Secondary | ICD-10-CM

## 2014-02-16 MED ORDER — HYDROCODONE-ACETAMINOPHEN 5-325 MG PO TABS: ORAL_TABLET | ORAL | Status: DC

## 2014-02-17 ENCOUNTER — Ambulatory Visit (HOSPITAL_COMMUNITY): Payer: Medicare Other | Admitting: Physical Therapy

## 2014-02-19 ENCOUNTER — Other Ambulatory Visit: Payer: Self-pay | Admitting: Orthopaedic Surgery

## 2014-02-19 ENCOUNTER — Ambulatory Visit (HOSPITAL_COMMUNITY): Payer: Medicare Other | Admitting: Physical Therapy

## 2014-02-19 DIAGNOSIS — M25551 Pain in right hip: Secondary | ICD-10-CM

## 2014-02-20 ENCOUNTER — Ambulatory Visit
Admission: RE | Admit: 2014-02-20 | Discharge: 2014-02-20 | Disposition: A | Discharge status: Home / Self Care | Payer: Medicare Other | Source: Ambulatory Visit | Attending: Orthopaedic Surgery | Admitting: Orthopaedic Surgery

## 2014-02-20 VITALS — BP 155/77 | HR 73

## 2014-02-20 DIAGNOSIS — M25551 Pain in right hip: Secondary | ICD-10-CM

## 2014-02-20 MED ORDER — IOHEXOL 180 MG/ML  SOLN
1.0000 mL | Freq: Once | INTRAMUSCULAR | Status: AC | PRN
  Administered 2014-02-20: 1 mL via INTRA_ARTICULAR

## 2014-02-20 MED ORDER — METHYLPREDNISOLONE ACETATE 40 MG/ML INJ SUSP (RADIOLOG
120.0000 mg | Freq: Once | INTRAMUSCULAR | Status: AC
  Administered 2014-02-20: 120 mg via INTRA_ARTICULAR

## 2014-02-20 MED ORDER — DIAZEPAM 5 MG PO TABS
5.0000 mg | ORAL_TABLET | Freq: Once | ORAL | Status: AC
  Administered 2014-02-20: 5 mg via ORAL

## 2014-02-24 ENCOUNTER — Ambulatory Visit (HOSPITAL_COMMUNITY): Payer: Medicare Other | Admitting: Physical Therapy

## 2014-02-26 ENCOUNTER — Ambulatory Visit (HOSPITAL_COMMUNITY): Payer: Medicare Other

## 2014-02-27 ENCOUNTER — Other Ambulatory Visit: Payer: Self-pay | Admitting: Orthopaedic Surgery

## 2014-02-27 ENCOUNTER — Ambulatory Visit
Admission: RE | Admit: 2014-02-27 | Discharge: 2014-02-27 | Disposition: A | Discharge status: Home / Self Care | Payer: Medicare Other | Source: Ambulatory Visit | Attending: Orthopaedic Surgery | Admitting: Orthopaedic Surgery

## 2014-02-27 DIAGNOSIS — M545 Low back pain, unspecified: Secondary | ICD-10-CM

## 2014-03-03 ENCOUNTER — Ambulatory Visit (HOSPITAL_COMMUNITY): Payer: Medicare Other

## 2014-03-05 ENCOUNTER — Ambulatory Visit (HOSPITAL_COMMUNITY): Payer: Medicare Other

## 2014-03-10 ENCOUNTER — Ambulatory Visit (HOSPITAL_COMMUNITY): Payer: Medicare Other | Admitting: Physical Therapy

## 2014-03-12 ENCOUNTER — Ambulatory Visit (HOSPITAL_COMMUNITY): Payer: Medicare Other | Admitting: Physical Therapy

## 2014-03-16 ENCOUNTER — Other Ambulatory Visit: Payer: Medicare Other

## 2014-03-17 ENCOUNTER — Ambulatory Visit (HOSPITAL_COMMUNITY): Payer: Medicare Other

## 2014-03-19 ENCOUNTER — Ambulatory Visit: Payer: Medicare Other | Admitting: Internal Medicine

## 2014-03-19 ENCOUNTER — Ambulatory Visit (HOSPITAL_COMMUNITY): Payer: Medicare Other | Admitting: Physical Therapy

## 2014-03-24 ENCOUNTER — Ambulatory Visit (HOSPITAL_COMMUNITY): Payer: Medicare Other | Admitting: Physical Therapy

## 2014-03-26 ENCOUNTER — Ambulatory Visit (HOSPITAL_COMMUNITY): Payer: Medicare Other | Admitting: Physical Therapy

## 2014-03-30 ENCOUNTER — Emergency Department (HOSPITAL_COMMUNITY)
Admission: EM | Admit: 2014-03-30 | Discharge: 2014-03-30 | Disposition: A | Discharge status: Home / Self Care | Payer: Medicare Other | Attending: Emergency Medicine | Admitting: Emergency Medicine

## 2014-03-30 ENCOUNTER — Encounter (HOSPITAL_COMMUNITY): Payer: Self-pay | Admitting: Emergency Medicine

## 2014-03-30 DIAGNOSIS — F17200 Nicotine dependence, unspecified, uncomplicated: Secondary | ICD-10-CM | POA: Insufficient documentation

## 2014-03-30 DIAGNOSIS — Z8639 Personal history of other endocrine, nutritional and metabolic disease: Secondary | ICD-10-CM | POA: Insufficient documentation

## 2014-03-30 DIAGNOSIS — IMO0001 Reserved for inherently not codable concepts without codable children: Secondary | ICD-10-CM | POA: Insufficient documentation

## 2014-03-30 DIAGNOSIS — F3289 Other specified depressive episodes: Secondary | ICD-10-CM | POA: Insufficient documentation

## 2014-03-30 DIAGNOSIS — Y929 Unspecified place or not applicable: Secondary | ICD-10-CM | POA: Insufficient documentation

## 2014-03-30 DIAGNOSIS — Y9389 Activity, other specified: Secondary | ICD-10-CM | POA: Insufficient documentation

## 2014-03-30 DIAGNOSIS — F329 Major depressive disorder, single episode, unspecified: Secondary | ICD-10-CM | POA: Insufficient documentation

## 2014-03-30 DIAGNOSIS — Z8739 Personal history of other diseases of the musculoskeletal system and connective tissue: Secondary | ICD-10-CM | POA: Insufficient documentation

## 2014-03-30 DIAGNOSIS — Z9089 Acquired absence of other organs: Secondary | ICD-10-CM | POA: Insufficient documentation

## 2014-03-30 DIAGNOSIS — Z79899 Other long term (current) drug therapy: Secondary | ICD-10-CM | POA: Insufficient documentation

## 2014-03-30 DIAGNOSIS — S90569A Insect bite (nonvenomous), unspecified ankle, initial encounter: Secondary | ICD-10-CM | POA: Insufficient documentation

## 2014-03-30 DIAGNOSIS — F411 Generalized anxiety disorder: Secondary | ICD-10-CM | POA: Insufficient documentation

## 2014-03-30 DIAGNOSIS — Z8719 Personal history of other diseases of the digestive system: Secondary | ICD-10-CM | POA: Insufficient documentation

## 2014-03-30 DIAGNOSIS — Z96659 Presence of unspecified artificial knee joint: Secondary | ICD-10-CM | POA: Insufficient documentation

## 2014-03-30 DIAGNOSIS — M545 Low back pain, unspecified: Secondary | ICD-10-CM | POA: Insufficient documentation

## 2014-03-30 DIAGNOSIS — W57XXXA Bitten or stung by nonvenomous insect and other nonvenomous arthropods, initial encounter: Secondary | ICD-10-CM

## 2014-03-30 DIAGNOSIS — Z8669 Personal history of other diseases of the nervous system and sense organs: Secondary | ICD-10-CM | POA: Insufficient documentation

## 2014-03-30 DIAGNOSIS — Z862 Personal history of diseases of the blood and blood-forming organs and certain disorders involving the immune mechanism: Secondary | ICD-10-CM | POA: Insufficient documentation

## 2014-03-30 MED ORDER — DOXYCYCLINE HYCLATE 100 MG PO CAPS: 100.0000 mg | ORAL_CAPSULE | Freq: Two times a day (BID) | ORAL | Status: DC

## 2014-03-30 MED ORDER — DOXYCYCLINE HYCLATE 100 MG PO TABS
100.0000 mg | ORAL_TABLET | Freq: Once | ORAL | Status: AC
  Administered 2014-03-30: 100 mg via ORAL
  Filled 2014-03-30: qty 1

## 2014-03-30 NOTE — ED Provider Notes (Signed)
CSN: [id number]     Arrival date & time 03/30/14  1040 History  This chart was scribed for Ezequiel Essex, MD by Roe Coombs, ED Scribe. The patient was seen in room APFT20/APFT20. Patient's care was started at 11:17 AM.  Chief Complaint  Patient presents with  . Tick Removal     The history is provided by the patient. No language interpreter was used.    HPI Comments: Brittany Mays is a 73 y.o. female who presents to the Emergency Department complaining of a possible bite to her left lateral thigh by a deer tick occuring yesterday. She has a small abrasion to her left thigh where she scratched the area. She says she was definitely bitten by a tick on her right wrist, but she was able clean this at home. Patient has a history of back pain and lumbar spinal stenosis. She is currently on a course of prednisone in preparation for a back surgery next month. She denies abdominal pain, chest pain, shortness of breath, headaches, dizziness, numbness or weakness of the extremities or any other symptoms at this time. Her other medical history includes primary hyperparathyroidism s/p parathyroidectomy and osteoarthritis of the knees.    Past Medical History  Diagnosis Date  . Depression   . Anxiety   . Insomnia   . Pain   . Cataract   . Osteoarthritis of both knees     s/p knee replacements  . Lumbar spinal stenosis     s/p laminectomy  . Hyperparathyroidism, primary     s/p parathyroidectomy  . Rectal prolapse     s/p repair   Past Surgical History  Procedure Laterality Date  . Abdominal hysterectomy    . Knee surgery Bilateral     Knee Replacements  . Spine surgery    . Parathyroidectomy    . Colonoscopy  2013   Family History  Problem Relation Age of Onset  . COPD Father   . Heart failure Father   . Diabetes Son    History  Substance Use Topics  . Smoking status: Current Every Day Smoker -- 1.00 packs/day for 50 years    Types: Cigarettes  . Smokeless tobacco: Not on file   . Alcohol Use: No   OB History   Grav Para Term Preterm Abortions TAB SAB Ect Mult Living                 Review of Systems A complete 10 system review of systems was obtained and all systems are negative except as noted in the HPI and PMH.    Allergies  Nsaids; Sodium pentobarbital; Codeine; and Fentanyl  Home Medications   Prior to Admission medications   Medication Sig Start Date End Date Taking? Authorizing Provider  AMBULATORY NON FORMULARY MEDICATION Medication Name: Somnapure (sleep aid) 2 by mouth at bedtime    Historical Provider, MD  calcium-vitamin D (OSCAL WITH D) 500-200 MG-UNIT per tablet Take 1 tablet by mouth.    Historical Provider, MD  fish oil-omega-3 fatty acids 1000 MG capsule Take 2 g by mouth daily.    Historical Provider, MD  HYDROcodone-acetaminophen (NORCO/VICODIN) 5-325 MG per tablet Take two tablets once daily as needed for pain 02/16/14   Gayland Curry, DO  Multiple Vitamins-Minerals (WOMENS MULTI) CAPS Take one tablet once daily    Historical Provider, MD  Red Yeast Rice 600 MG TABS Take 600 mg by mouth daily.    Historical Provider, MD  venlafaxine XR (EFFEXOR XR) 150 MG  24 hr capsule Take 1 capsule (150 mg total) by mouth daily with breakfast. 01/01/14   Tiffany L Reed, DO  vitamin B-12 (CYANOCOBALAMIN) 100 MCG tablet Take 50 mcg by mouth daily.    Historical Provider, MD  zoster vaccine live, PF, (ZOSTAVAX) 16109 UNT/0.65ML injection Inject 19,400 Units into the skin once. 12/15/13   Tiffany L Reed, DO   Triage Vitals: BP 125/64  Pulse 92  Temp(Src) 98.1 F (36.7 C) (Oral)  Resp 2  Ht 5\' 1"  (1.549 m)  Wt 164 lb (74.39 kg)  BMI 31.00 kg/m2  SpO2 98% Physical Exam  Nursing note and vitals reviewed. Constitutional: She is oriented to person, place, and time. She appears well-developed and well-nourished.  HENT:  Head: Normocephalic and atraumatic.  Eyes: Conjunctivae and EOM are normal.  Neck: Neck supple.  Cardiovascular: Normal rate, regular  rhythm and normal heart sounds.  Exam reveals no gallop and no friction rub.   No murmur heard. Pulmonary/Chest: Effort normal and breath sounds normal. No respiratory distress. She has no wheezes. She has no rales.  Abdominal: Soft. There is no tenderness. There is no rebound and no guarding.  Musculoskeletal: Normal range of motion. She exhibits no edema and no tenderness.  Neurological: She is alert and oriented to person, place, and time. No cranial nerve deficit. She exhibits normal muscle tone. Coordination normal.  Skin: Skin is warm and dry. Abrasion noted.  1 cm scattered area of abrasion to the left lateral thigh. No surrounding erythema. No tick parts evident. 0.5 cm scabbed area without any overlying erythema.  Psychiatric: She has a normal mood and affect. Her behavior is normal.    ED Course  Procedures (including critical care time) DIAGNOSTIC STUDIES: Oxygen Saturation is 98% on room air, normal by my interpretation.    COORDINATION OF CARE: 11:21 AM- Patient presents after a tick bite to right wrist and possible bite to left thigh. Will treat with doxycycline. Patient informed of current plan for treatment and evaluation and agrees with plan at this time.    MDM   Final diagnoses:  None   Abrasion to L thigh that patient states is tick bite.  No surrounding erythema.  No fever.  Will treat with doxycycline for RMSF and lyme prophylaxis.  No evidence of cellulitis or abscess.  Follow up with PCP. Return precautions discussed.  I personally performed the services described in this documentation, which was scribed in my presence. The recorded information has been reviewed and is accurate.   Ezequiel Essex, MD 03/30/14 [phone]

## 2014-03-30 NOTE — ED Notes (Signed)
Complain of tick bite to right thigh

## 2014-03-30 NOTE — ED Notes (Signed)
Pt states she found tick on L Outer thigh which she removed and "dug all around and made bleed" and now she states she has red circular area around perimeter of bite. Assessment reveals definite scratch-like abrasion to area of tick bite on L. Thigh with slight redness noted to rt of abrasion.

## 2014-03-30 NOTE — Discharge Instructions (Signed)
Tick Bite Information Ticks are insects that attach themselves to the skin and draw blood for food. There are various types of ticks. Common types include wood ticks and deer ticks. Most ticks live in shrubs and grassy areas. Ticks can climb onto your body when you make contact with leaves or grass where the tick is waiting. The most common places on the body for ticks to attach themselves are the scalp, neck, armpits, waist, and groin. Most tick bites are harmless, but sometimes ticks carry germs that cause diseases. These germs can be spread to a person during the tick's feeding process. The chance of a disease spreading through a tick bite depends on:   The type of tick.  Time of year.   How long the tick is attached.   Geographic location.  HOW CAN YOU PREVENT TICK BITES? Take these steps to help prevent tick bites when you are outdoors:  Wear protective clothing. Long sleeves and long pants are best.   Wear white clothes so you can see ticks more easily.  Tuck your pant legs into your socks.   If walking on a trail, stay in the middle of the trail to avoid brushing against bushes.  Avoid walking through areas with long grass.  Put insect repellent on all exposed skin and along boot tops, pant legs, and sleeve cuffs.   Check clothing, hair, and skin repeatedly and before going inside.   Brush off any ticks that are not attached.  Take a shower or bath as soon as possible after being outdoors.  WHAT IS THE PROPER WAY TO REMOVE A TICK? Ticks should be removed as soon as possible to help prevent diseases caused by tick bites. 1. If latex gloves are available, put them on before trying to remove a tick.  2. Using fine-point tweezers, grasp the tick as close to the skin as possible. You may also use curved forceps or a tick removal tool. Grasp the tick as close to its head as possible. Avoid grasping the tick on its body. 3. Pull gently with steady upward pressure until  the tick lets go. Do not twist the tick or jerk it suddenly. This may break off the tick's head or mouth parts. 4. Do not squeeze or crush the tick's body. This could force disease-carrying fluids from the tick into your body.  5. After the tick is removed, wash the bite area and your hands with soap and water or other disinfectant such as alcohol. 6. Apply a small amount of antiseptic cream or ointment to the bite site.  7. Wash and disinfect any instruments that were used.  Do not try to remove a tick by applying a hot match, petroleum jelly, or fingernail polish to the tick. These methods do not work and may increase the chances of disease being spread from the tick bite.  WHEN SHOULD YOU SEEK MEDICAL CARE? Contact your health care provider if you are unable to remove a tick from your skin or if a part of the tick breaks off and is stuck in the skin.  After a tick bite, you need to be aware of signs and symptoms that could be related to diseases spread by ticks. Contact your health care provider if you develop any of the following in the days or weeks after the tick bite:  Unexplained fever.  Rash. A circular rash that appears days or weeks after the tick bite may indicate the possibility of Lyme disease. The rash may resemble   a target with a bull's-eye and may occur at a different part of your body than the tick bite.  Redness and swelling in the area of the tick bite.   Tender, swollen lymph glands.   Diarrhea.   Weight loss.   Cough.   Fatigue.   Muscle, joint, or bone pain.   Abdominal pain.   Headache.   Lethargy or a change in your level of consciousness.  Difficulty walking or moving your legs.   Numbness in the legs.   Paralysis.  Shortness of breath.   Confusion.   Repeated vomiting.  Document Released: 10/20/2000 Document Revised: 08/13/2013 Document Reviewed: 04/02/2013 [employer] Patient Information 2014 [employer]  

## 2014-03-31 ENCOUNTER — Ambulatory Visit (HOSPITAL_COMMUNITY): Payer: Medicare Other | Admitting: Physical Therapy

## 2014-04-02 ENCOUNTER — Encounter: Payer: Self-pay | Admitting: Internal Medicine

## 2014-04-02 ENCOUNTER — Ambulatory Visit (HOSPITAL_COMMUNITY): Payer: Medicare Other | Admitting: Physical Therapy

## 2014-04-02 ENCOUNTER — Ambulatory Visit (INDEPENDENT_AMBULATORY_CARE_PROVIDER_SITE_OTHER): Payer: Medicare Other | Admitting: Internal Medicine

## 2014-04-02 VITALS — BP 126/80 | HR 101 | Temp 98.4°F | Resp 10 | Ht 61.0 in | Wt 161.0 lb

## 2014-04-02 DIAGNOSIS — F3289 Other specified depressive episodes: Secondary | ICD-10-CM

## 2014-04-02 DIAGNOSIS — F17200 Nicotine dependence, unspecified, uncomplicated: Secondary | ICD-10-CM

## 2014-04-02 DIAGNOSIS — A6920 Lyme disease, unspecified: Secondary | ICD-10-CM

## 2014-04-02 DIAGNOSIS — I10 Essential (primary) hypertension: Secondary | ICD-10-CM

## 2014-04-02 DIAGNOSIS — M48061 Spinal stenosis, lumbar region without neurogenic claudication: Secondary | ICD-10-CM

## 2014-04-02 DIAGNOSIS — F32A Depression, unspecified: Secondary | ICD-10-CM

## 2014-04-02 DIAGNOSIS — Z720 Tobacco use: Secondary | ICD-10-CM

## 2014-04-02 DIAGNOSIS — F329 Major depressive disorder, single episode, unspecified: Secondary | ICD-10-CM

## 2014-04-02 DIAGNOSIS — R151 Fecal smearing: Secondary | ICD-10-CM

## 2014-04-02 NOTE — Progress Notes (Signed)
Patient ID: Brittany Mays, female   DOB: 1941/06/14, 73 y.o.   MRN: [id number]   Location:  [facility]  Code Status: full code  Allergies  Allergen Reactions  . Nsaids Diarrhea and Other (See Comments)    "horrible cramps".  . Sodium Pentobarbital [Pentobarbital] Other (See Comments)    Used as a child in an operation.  MDs told her parents never to let her take this again.  . Codeine Hives and Other (See Comments)    Cramping   . Fentanyl Itching and Other (See Comments)    sweating duragesic patch only; can tolerate IV    Chief Complaint  Patient presents with  . Follow-up    ER follow-up for tick bite   . FYI    Pending back surgery 04/20/14 with Dr.Cohen  . FYI    Paitent will see Dr.Medoff soon for bowel concerns     HPI: Patient is a 73 y.o. white female seen in the office today for f/u after ED visit for tick bite.  She was put on doxycycline to prevent lyme disease.  Is finishing that right now.  Site almost gone.    Dr. Ninfa Linden gave her shot in left hip for trochanteric bursitis.  Then came into left hip and into groin.  She went to see Dr. Nicole Kindred done.  She has back surgery pending by Dr. Patrice Paradise 04/20/14.  She also has upcoming appt with Dr. Earlean Shawl due to bowel concerns.  Had been having soft pasty stool that was escaping unbenounced to her.  Took loperamide a few times which didn't help.  Is unsure why, but it's gone now.    Review of Systems:  Review of Systems  Constitutional: Negative for fever and chills.  HENT: Negative for congestion.   Eyes: Negative for blurred vision.  Respiratory: Negative for shortness of breath.   Cardiovascular: Negative for chest pain and leg swelling.  Gastrointestinal: Negative for constipation.  Genitourinary: Negative for dysuria.  Musculoskeletal: Positive for back pain and joint pain. Negative for falls.  Skin: Negative for rash.  Neurological: Negative for dizziness and loss  of consciousness.  Endo/Heme/Allergies: Does not bruise/bleed easily.  Psychiatric/Behavioral: Negative for depression and memory loss. The patient is nervous/anxious.        Extra jittery with prednisone     Past Medical History  Diagnosis Date  . Depression   . Anxiety   . Insomnia   . Pain   . Cataract   . Osteoarthritis of both knees     s/p knee replacements  . Lumbar spinal stenosis     s/p laminectomy  . Hyperparathyroidism, primary     s/p parathyroidectomy  . Rectal prolapse     s/p repair    Past Surgical History  Procedure Laterality Date  . Abdominal hysterectomy    . Knee surgery Bilateral     Knee Replacements  . Spine surgery    . Parathyroidectomy    . Colonoscopy  2013    Social History:   reports that she has been smoking Cigarettes.  She has a 50 pack-year smoking history. She does not have any smokeless tobacco history on file. She reports that she does not drink alcohol or use illicit drugs.  Family History  Problem Relation Age of Onset  . COPD Father   . Heart failure Father   . Diabetes Son     Medications: Patient's Medications  New Prescriptions   No medications on  file  Previous Medications   AMBULATORY NON FORMULARY MEDICATION    Medication Name: Somnapure (sleep aid) 2 by mouth at bedtime   CALCIUM-VITAMIN D (OSCAL WITH D) 500-200 MG-UNIT PER TABLET    Take 1 tablet by mouth.   DOXYCYCLINE (VIBRAMYCIN) 100 MG CAPSULE    Take 1 capsule (100 mg total) by mouth 2 (two) times daily.   FISH OIL-OMEGA-3 FATTY ACIDS 1000 MG CAPSULE    Take 1 g by mouth daily.    GABAPENTIN (NEURONTIN) 300 MG CAPSULE    Take 1-2 capsules by mouth 3 (three) times daily.   MULTIPLE VITAMINS-MINERALS (WOMENS MULTI) CAPS    Take one tablet once daily   OXYCODONE-ACETAMINOPHEN (PERCOCET) 10-325 MG PER TABLET    Take 1-2 tablets by mouth every 4 (four) hours as needed for pain.    PREDNISONE (DELTASONE) 10 MG TABLET       RED YEAST RICE 600 MG TABS    Take 600  mg by mouth 2 (two) times daily.    VENLAFAXINE XR (EFFEXOR XR) 150 MG 24 HR CAPSULE    Take 1 capsule (150 mg total) by mouth daily with breakfast.   VITAMIN B-12 (CYANOCOBALAMIN) 100 MCG TABLET    Take 100 mcg by mouth daily.   Modified Medications   No medications on file  Discontinued Medications   No medications on file     Physical Exam: Filed Vitals:   04/02/14 1442  BP: 126/80  Pulse: 101  Temp: 98.4 F (36.9 C)  TempSrc: Oral  Resp: 10  Height: 5\' 1"  (1.549 m)  Weight: 161 lb (73.029 kg)  SpO2: 96%  Physical Exam  Constitutional: She is oriented to person, place, and time. She appears well-developed and well-nourished.  Jittery and diaphoretic  Cardiovascular: Normal rate, regular rhythm, normal heart sounds and intact distal pulses.   Pulmonary/Chest: Effort normal and breath sounds normal. No respiratory distress.  Abdominal: Soft. Bowel sounds are normal. She exhibits no distension and no mass. There is no tenderness.  Musculoskeletal: She exhibits tenderness. She exhibits no edema.  Of right SI region;  Walks with limp of right leg  Neurological: She is alert and oriented to person, place, and time.  Skin: Skin is warm and dry.  Psychiatric:  anxious    Labs reviewed: Basic Metabolic Panel:  Recent Labs  06/23/13 1137 12/09/13 0843  NA 143 145*  K 4.9 4.7  CL 102 104  CO2 28 27  GLUCOSE 89 98  BUN 13 12  CREATININE 0.79 0.69  CALCIUM 9.6 9.3   Liver Function Tests:  Recent Labs  06/23/13 1137 12/09/13 0843  AST 21 23  ALT 14 12  ALKPHOS 64 57  BILITOT 0.2 0.3  PROT 6.6 6.3  CBC:  Recent Labs  06/23/13 1137 12/09/13 0843  WBC 5.7 4.7  NEUTROABS 3.0 2.2  HGB 13.0 12.9  HCT 38.7 38.1  MCV 94 95   Lipid Panel:  Recent Labs  07/04/13 0921 12/09/13 0843  HDL 56 62  LDLCALC 115* 115*  TRIG 251* 185*  CHOLHDL 3.9 3.5   Assessment/Plan 1. Lumbar spinal stenosis -has upcoming surgery coming -on prednisone and percocet for  pain until surgery  2. Essential hypertension, benign -bp at goal today despite anxious jittery appearance on steroids  3. Tobacco abuse -continues to smoke--says she'll try to quit when she has her surgery and can't smoke anyway -unfortunately husband also smokes and I am not very optimistic that he will quit with his  ptsd and longstanding history  4. Depression -stable with effexor  5. Erythema migrans (Lyme disease) -was on left thigh--only has small red excoriated area now -continues on doxycycline to complete course  6. Fecal soiling -has now resolved, but has GI appt upcoming  Labs/tests ordered:  None today  Next appt:  Cancel current planned appt and schedule another after surgery

## 2014-04-03 NOTE — Progress Notes (Signed)
  Patient Details  Name: MONQUIE FULGHAM MRN: [id number] Date of Birth: 1940/12/09  Today's Date: 04/03/2014  Discharging patient due to non compliance and her not returning for therapy after initial evaluation  Suzette Battiest Raheel Kunkle 04/03/2014, 3:43 PM

## 2014-04-03 NOTE — Addendum Note (Signed)
Encounter addended by: Leia Alf, PT on: 04/03/2014  3:50 PM<BR>     Documentation filed: Notes Section, Letters, Episodes

## 2014-04-14 ENCOUNTER — Encounter: Payer: Self-pay | Admitting: Internal Medicine

## 2014-04-28 ENCOUNTER — Encounter (HOSPITAL_COMMUNITY): Payer: Self-pay | Admitting: Emergency Medicine

## 2014-04-28 ENCOUNTER — Emergency Department (HOSPITAL_COMMUNITY): Payer: Medicare Other

## 2014-04-28 ENCOUNTER — Inpatient Hospital Stay (HOSPITAL_COMMUNITY)
Admission: EM | Admit: 2014-04-28 | Discharge: 2014-05-01 | DRG: 689 | Disposition: A | Discharge status: Skilled Nursing Facility | Payer: Medicare Other | Attending: Family Medicine | Admitting: Family Medicine

## 2014-04-28 DIAGNOSIS — Z79899 Other long term (current) drug therapy: Secondary | ICD-10-CM

## 2014-04-28 DIAGNOSIS — T4275XA Adverse effect of unspecified antiepileptic and sedative-hypnotic drugs, initial encounter: Secondary | ICD-10-CM | POA: Diagnosis present

## 2014-04-28 DIAGNOSIS — R262 Difficulty in walking, not elsewhere classified: Secondary | ICD-10-CM

## 2014-04-28 DIAGNOSIS — T481X5A Adverse effect of skeletal muscle relaxants [neuromuscular blocking agents], initial encounter: Secondary | ICD-10-CM | POA: Diagnosis present

## 2014-04-28 DIAGNOSIS — Z6829 Body mass index (BMI) 29.0-29.9, adult: Secondary | ICD-10-CM

## 2014-04-28 DIAGNOSIS — E210 Primary hyperparathyroidism: Secondary | ICD-10-CM | POA: Diagnosis present

## 2014-04-28 DIAGNOSIS — T40605A Adverse effect of unspecified narcotics, initial encounter: Secondary | ICD-10-CM | POA: Diagnosis present

## 2014-04-28 DIAGNOSIS — E669 Obesity, unspecified: Secondary | ICD-10-CM | POA: Diagnosis present

## 2014-04-28 DIAGNOSIS — M549 Dorsalgia, unspecified: Secondary | ICD-10-CM

## 2014-04-28 DIAGNOSIS — F3289 Other specified depressive episodes: Secondary | ICD-10-CM | POA: Diagnosis present

## 2014-04-28 DIAGNOSIS — R4182 Altered mental status, unspecified: Secondary | ICD-10-CM

## 2014-04-28 DIAGNOSIS — M1710 Unilateral primary osteoarthritis, unspecified knee: Secondary | ICD-10-CM | POA: Diagnosis present

## 2014-04-28 DIAGNOSIS — F411 Generalized anxiety disorder: Secondary | ICD-10-CM | POA: Diagnosis present

## 2014-04-28 DIAGNOSIS — R339 Retention of urine, unspecified: Secondary | ICD-10-CM

## 2014-04-28 DIAGNOSIS — G8929 Other chronic pain: Secondary | ICD-10-CM | POA: Diagnosis present

## 2014-04-28 DIAGNOSIS — N390 Urinary tract infection, site not specified: Secondary | ICD-10-CM

## 2014-04-28 DIAGNOSIS — F329 Major depressive disorder, single episode, unspecified: Secondary | ICD-10-CM | POA: Diagnosis present

## 2014-04-28 DIAGNOSIS — Z981 Arthrodesis status: Secondary | ICD-10-CM

## 2014-04-28 DIAGNOSIS — D649 Anemia, unspecified: Secondary | ICD-10-CM | POA: Diagnosis present

## 2014-04-28 DIAGNOSIS — G9340 Encephalopathy, unspecified: Secondary | ICD-10-CM | POA: Diagnosis present

## 2014-04-28 DIAGNOSIS — F419 Anxiety disorder, unspecified: Secondary | ICD-10-CM | POA: Diagnosis present

## 2014-04-28 DIAGNOSIS — Z96659 Presence of unspecified artificial knee joint: Secondary | ICD-10-CM

## 2014-04-28 DIAGNOSIS — F17200 Nicotine dependence, unspecified, uncomplicated: Secondary | ICD-10-CM | POA: Diagnosis present

## 2014-04-28 DIAGNOSIS — Z8249 Family history of ischemic heart disease and other diseases of the circulatory system: Secondary | ICD-10-CM

## 2014-04-28 DIAGNOSIS — N3000 Acute cystitis without hematuria: Secondary | ICD-10-CM

## 2014-04-28 DIAGNOSIS — T424X5A Adverse effect of benzodiazepines, initial encounter: Secondary | ICD-10-CM | POA: Diagnosis present

## 2014-04-28 DIAGNOSIS — B961 Klebsiella pneumoniae [K. pneumoniae] as the cause of diseases classified elsewhere: Secondary | ICD-10-CM | POA: Diagnosis present

## 2014-04-28 DIAGNOSIS — Z833 Family history of diabetes mellitus: Secondary | ICD-10-CM

## 2014-04-28 HISTORY — DX: Urinary tract infection, site not specified: N39.0

## 2014-04-28 LAB — CBC WITH DIFFERENTIAL/PLATELET
BASOS PCT: 0 % (ref 0–1)
Basophils Absolute: 0 10*3/uL (ref 0.0–0.1)
Eosinophils Absolute: 0.2 10*3/uL (ref 0.0–0.7)
Eosinophils Relative: 3 % (ref 0–5)
HEMATOCRIT: 29.2 % — AB (ref 36.0–46.0)
Hemoglobin: 10 g/dL — ABNORMAL LOW (ref 12.0–15.0)
LYMPHS PCT: 29 % (ref 12–46)
Lymphs Abs: 1.8 10*3/uL (ref 0.7–4.0)
MCH: 33.2 pg (ref 26.0–34.0)
MCHC: 34.2 g/dL (ref 30.0–36.0)
MCV: 97 fL (ref 78.0–100.0)
MONO ABS: 0.6 10*3/uL (ref 0.1–1.0)
MONOS PCT: 10 % (ref 3–12)
NEUTROS PCT: 58 % (ref 43–77)
Neutro Abs: 3.5 10*3/uL (ref 1.7–7.7)
Platelets: 338 10*3/uL (ref 150–400)
RBC: 3.01 MIL/uL — AB (ref 3.87–5.11)
RDW: 16 % — ABNORMAL HIGH (ref 11.5–15.5)
WBC: 6.1 10*3/uL (ref 4.0–10.5)

## 2014-04-28 LAB — COMPREHENSIVE METABOLIC PANEL
ALBUMIN: 2.6 g/dL — AB (ref 3.5–5.2)
ALK PHOS: 59 U/L (ref 39–117)
ALT: 21 U/L (ref 0–35)
AST: 30 U/L (ref 0–37)
BILIRUBIN TOTAL: 0.2 mg/dL — AB (ref 0.3–1.2)
BUN: 16 mg/dL (ref 6–23)
CHLORIDE: 102 meq/L (ref 96–112)
CO2: 30 meq/L (ref 19–32)
CREATININE: 0.72 mg/dL (ref 0.50–1.10)
Calcium: 8.5 mg/dL (ref 8.4–10.5)
GFR calc non Af Amer: 82 mL/min — ABNORMAL LOW (ref >90)
GLUCOSE: 93 mg/dL (ref 70–99)
POTASSIUM: 3.8 meq/L (ref 3.7–5.3)
Sodium: 142 mEq/L (ref 137–147)
Total Protein: 5.8 g/dL — ABNORMAL LOW (ref 6.0–8.3)

## 2014-04-28 LAB — URINALYSIS, ROUTINE W REFLEX MICROSCOPIC
Bilirubin Urine: NEGATIVE
GLUCOSE, UA: NEGATIVE mg/dL
Hgb urine dipstick: NEGATIVE
KETONES UR: NEGATIVE mg/dL
Leukocytes, UA: NEGATIVE
Nitrite: POSITIVE — AB
Protein, ur: NEGATIVE mg/dL
SPECIFIC GRAVITY, URINE: 1.01 (ref 1.005–1.030)
Urobilinogen, UA: 0.2 mg/dL (ref 0.0–1.0)
pH: 6.5 (ref 5.0–8.0)

## 2014-04-28 LAB — URINE MICROSCOPIC-ADD ON

## 2014-04-28 LAB — TROPONIN I: Troponin I: <0.3 ng/mL (ref <0.30)

## 2014-04-28 MED ORDER — DEXTROSE 5 % IV SOLN
1.0000 g | INTRAVENOUS | Status: DC
  Filled 2014-04-28: qty 10

## 2014-04-28 MED ORDER — SODIUM CHLORIDE 0.9 % IJ SOLN
3.0000 mL | Freq: Two times a day (BID) | INTRAMUSCULAR | Status: DC
  Administered 2014-04-30: 3 mL via INTRAVENOUS

## 2014-04-28 MED ORDER — DOCUSATE SODIUM 100 MG PO CAPS
100.0000 mg | ORAL_CAPSULE | Freq: Two times a day (BID) | ORAL | Status: DC
  Administered 2014-04-29 – 2014-05-01 (×5): 100 mg via ORAL
  Filled 2014-04-28 (×6): qty 1

## 2014-04-28 MED ORDER — OXYCODONE HCL 5 MG PO TABS
5.0000 mg | ORAL_TABLET | ORAL | Status: DC | PRN
  Administered 2014-04-29 – 2014-05-01 (×14): 5 mg via ORAL
  Filled 2014-04-28 (×14): qty 1

## 2014-04-28 MED ORDER — SODIUM CHLORIDE 0.9 % IV SOLN: INTRAVENOUS | Status: DC

## 2014-04-28 MED ORDER — ONDANSETRON HCL 4 MG PO TABS: 4.0000 mg | ORAL_TABLET | Freq: Four times a day (QID) | ORAL | Status: DC | PRN

## 2014-04-28 MED ORDER — ACETAMINOPHEN 325 MG PO TABS: 650.0000 mg | ORAL_TABLET | Freq: Four times a day (QID) | ORAL | Status: DC | PRN

## 2014-04-28 MED ORDER — GADOBENATE DIMEGLUMINE 529 MG/ML IV SOLN
15.0000 mL | Freq: Once | INTRAVENOUS | Status: AC | PRN
  Administered 2014-04-28: 15 mL via INTRAVENOUS

## 2014-04-28 MED ORDER — ACETAMINOPHEN 650 MG RE SUPP: 650.0000 mg | Freq: Four times a day (QID) | RECTAL | Status: DC | PRN

## 2014-04-28 MED ORDER — ONDANSETRON HCL 4 MG/2ML IJ SOLN: 4.0000 mg | Freq: Four times a day (QID) | INTRAMUSCULAR | Status: DC | PRN

## 2014-04-28 MED ORDER — DEXTROSE 5 % IV SOLN
1.0000 g | Freq: Once | INTRAVENOUS | Status: AC
  Administered 2014-04-28: 1 g via INTRAVENOUS

## 2014-04-28 MED ORDER — ALBUTEROL SULFATE (2.5 MG/3ML) 0.083% IN NEBU: 2.5000 mg | INHALATION_SOLUTION | RESPIRATORY_TRACT | Status: DC | PRN

## 2014-04-28 MED ORDER — SODIUM CHLORIDE 0.9 % IV SOLN
INTRAVENOUS | Status: AC
Start: 2014-04-29 — End: 2014-04-29

## 2014-04-28 MED ORDER — SODIUM CHLORIDE 0.9 % IV BOLUS (SEPSIS)
1000.0000 mL | Freq: Once | INTRAVENOUS | Status: AC
  Administered 2014-04-28: 1000 mL via INTRAVENOUS

## 2014-04-28 MED ORDER — LORAZEPAM 2 MG/ML IJ SOLN
0.5000 mg | Freq: Once | INTRAMUSCULAR | Status: AC
  Administered 2014-04-28: 0.5 mg via INTRAVENOUS

## 2014-04-28 MED ORDER — METHOCARBAMOL 500 MG PO TABS
500.0000 mg | ORAL_TABLET | Freq: Three times a day (TID) | ORAL | Status: DC | PRN
  Administered 2014-04-29 – 2014-05-01 (×6): 500 mg via ORAL
  Filled 2014-04-28 (×6): qty 1

## 2014-04-28 NOTE — H&P (Signed)
[facility] History and Physical  Brittany Mays GXQ:[id number] DOB: [birth year] DOA: 04/28/2014   PCP: Hollace Kinnier, DO  Specialists: Dr. Rennis Harding is her spine specialist  Chief Complaint: Confusion  HPI: Brittany Mays is a 73 y.o. 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 Patrice Paradise in [facility]. She was released from the hospital on June 17. She has been doing okay at home. She has been ambulating with a walker. Over the last few days the patient's family has noted worsening confusion. This has progressively gotten worse and today they decided to bring her into the hospital. There's been no fever. No chills. Patient is very confused and agitated at times. Crying at times and unable to provide much history. Her son is at the bedside, but not much information was available from him either. According to the patient's husband who spoke to the ED physician patient was started on multiple new medications since discharge, including tramadol, flexible, Xanax, baclofen, and gabapentin. History is extremely limited.  Home Medications: Prior to Admission medications   Medication Sig Start Date End Date Taking? Authorizing Provider  ALPRAZolam Duanne Moron) 0.5 MG tablet Take 1 tablet by mouth 3 (three) times daily as needed for anxiety.  04/23/14  Yes Historical Provider, MD  baclofen (LIORESAL) 10 MG tablet Take 20 mg by mouth every 8 (eight) hours.  04/24/14  Yes Historical Provider, MD  calcium-vitamin D (OSCAL WITH D) 500-200 MG-UNIT per tablet Take 1 tablet by mouth daily with breakfast.    Yes Historical Provider, MD  gabapentin (NEURONTIN) 300 MG capsule Take 1-2 capsules by mouth 3 (three) times daily. 03/18/14  Yes Historical Provider, MD  Multiple Vitamins-Minerals (WOMENS MULTI) CAPS Take one tablet once daily   Yes Historical Provider, MD  oxyCODONE-acetaminophen (PERCOCET) 10-325 MG per  tablet Take 1 tablet by mouth every 4 (four) hours as needed for pain.  03/20/14  Yes Historical Provider, MD  traMADol (ULTRAM) 50 MG tablet Take 50-100 mg by mouth 3 (three) times daily as needed. For pain 04/23/14  Yes Historical Provider, MD  AMBULATORY NON FORMULARY MEDICATION Medication Name: Somnapure (sleep aid) 2 by mouth at bedtime    Historical Provider, MD  fish oil-omega-3 fatty acids 1000 MG capsule Take 1 g by mouth daily.     Historical Provider, MD  Red Yeast Rice 600 MG TABS Take 600 mg by mouth 2 (two) times daily.     Historical Provider, MD  vitamin B-12 (CYANOCOBALAMIN) 100 MCG tablet Take 100 mcg by mouth daily.     Historical Provider, MD    Allergies:  Allergies  Allergen Reactions  . Nsaids Diarrhea and Other (See Comments)    "horrible cramps".  . Sodium Pentobarbital [Pentobarbital] Other (See Comments)    Used as a child in an operation.  MDs told her parents never to let her take this again.  . Codeine Hives and Other (See Comments)    Cramping   . Fentanyl Itching and Other (See Comments)    sweating duragesic patch only; can tolerate IV    Past Medical History: Past Medical History  Diagnosis Date  . Depression   . Anxiety   . Insomnia   . Pain   . Cataract   . Osteoarthritis of both knees     s/p knee replacements  . Lumbar spinal stenosis     s/p laminectomy  . Hyperparathyroidism,  primary     s/p parathyroidectomy  . Rectal prolapse     s/p repair    Past Surgical History  Procedure Laterality Date  . Abdominal hysterectomy    . Knee surgery Bilateral     Knee Replacements  . Spine surgery    . Parathyroidectomy    . Colonoscopy  2013    Social History: Patient lives locally. Her grandchildren live with her. No history of smoking or alcohol use. Uses a walker to ambulate.  Family History:  Family History  Problem Relation Age of Onset  . COPD Father   . Heart failure Father   . Diabetes Son      Review of Systems - unable  to do due to acute encephalopathy  Physical Examination  Filed Vitals:   04/28/14 1700 04/28/14 1722  BP: 142/83   Pulse: 80   Temp:  98.4 F (36.9 C)  TempSrc:  Rectal  SpO2: 95%     BP 142/83  Pulse 80  Temp(Src) 98.4 F (36.9 C) (Rectal)  SpO2 95%  General appearance: appears stated age, distracted, uncooperative and agitated at times. Head: Normocephalic, without obvious abnormality, atraumatic Eyes: conjunctivae/corneas clear. PERRL, EOM's intact.  Throat: dry mm Neck: no adenopathy, no carotid bruit, no JVD, supple, symmetrical, trachea midline and thyroid not enlarged, symmetric, no tenderness/mass/nodules Resp: clear to auscultation bilaterally Cardio: regular rate and rhythm, S1, S2 normal, no murmur, click, rub or gallop GI: soft, non-tender; bowel sounds normal; no masses,  no organomegaly Extremities: extremities normal, atraumatic, no cyanosis or edema Pulses: 2+ and symmetric Skin: Skin color, texture, turgor normal. No rashes or lesions Patient not fully cooperative with examination of the back. However, the back. Was examined by the ED physician. Neurologic: She is awake, agitated. No facial droop. Tongue is midline. No obvious cranial nerve deficits. Motor strength is 5 out of 5 bilateral upper extremities. She has good strength 4-5 out of 5 in the lower extremities bilaterally. Gait was not assessed.  Laboratory Data: Results for orders placed during the hospital encounter of 04/28/14 (from the past 48 hour(s))  URINALYSIS, ROUTINE W REFLEX MICROSCOPIC     Status: Abnormal   Collection Time    04/28/14  5:20 PM      Result Value Ref Range   Color, Urine YELLOW  YELLOW   APPearance CLEAR  CLEAR   Specific Gravity, Urine 1.010  1.005 - 1.030   pH 6.5  5.0 - 8.0   Glucose, UA NEGATIVE  NEGATIVE mg/dL   Hgb urine dipstick NEGATIVE  NEGATIVE   Bilirubin Urine NEGATIVE  NEGATIVE   Ketones, ur NEGATIVE  NEGATIVE mg/dL   Protein, ur NEGATIVE  NEGATIVE mg/dL     Urobilinogen, UA 0.2  0.0 - 1.0 mg/dL   Nitrite POSITIVE (*) NEGATIVE   Leukocytes, UA NEGATIVE  NEGATIVE  URINE MICROSCOPIC-ADD ON     Status: Abnormal   Collection Time    04/28/14  5:20 PM      Result Value Ref Range   Squamous Epithelial / LPF FEW (*) RARE   WBC, UA 11-20  <3 WBC/hpf   Bacteria, UA MANY (*) RARE  CBC WITH DIFFERENTIAL     Status: Abnormal   Collection Time    04/28/14  6:02 PM      Result Value Ref Range   WBC 6.1  4.0 - 10.5 K/uL   RBC 3.01 (*) 3.87 - 5.11 MIL/uL   Hemoglobin 10.0 (*) 12.0 - 15.0 g/dL  HCT 29.2 (*) 36.0 - 46.0 %   MCV 97.0  78.0 - 100.0 fL   MCH 33.2  26.0 - 34.0 pg   MCHC 34.2  30.0 - 36.0 g/dL   RDW 16.0 (*) 11.5 - 15.5 %   Platelets 338  150 - 400 K/uL   Neutrophils Relative % 58  43 - 77 %   Neutro Abs 3.5  1.7 - 7.7 K/uL   Lymphocytes Relative 29  12 - 46 %   Lymphs Abs 1.8  0.7 - 4.0 K/uL   Monocytes Relative 10  3 - 12 %   Monocytes Absolute 0.6  0.1 - 1.0 K/uL   Eosinophils Relative 3  0 - 5 %   Eosinophils Absolute 0.2  0.0 - 0.7 K/uL   Basophils Relative 0  0 - 1 %   Basophils Absolute 0.0  0.0 - 0.1 K/uL  COMPREHENSIVE METABOLIC PANEL     Status: Abnormal   Collection Time    04/28/14  6:02 PM      Result Value Ref Range   Sodium 142  137 - 147 mEq/L   Potassium 3.8  3.7 - 5.3 mEq/L   Chloride 102  96 - 112 mEq/L   CO2 30  19 - 32 mEq/L   Glucose, Bld 93  70 - 99 mg/dL   BUN 16  6 - 23 mg/dL   Creatinine, Ser 0.72  0.50 - 1.10 mg/dL   Calcium 8.5  8.4 - 10.5 mg/dL   Total Protein 5.8 (*) 6.0 - 8.3 g/dL   Albumin 2.6 (*) 3.5 - 5.2 g/dL   AST 30  0 - 37 U/L   ALT 21  0 - 35 U/L   Alkaline Phosphatase 59  39 - 117 U/L   Total Bilirubin 0.2 (*) 0.3 - 1.2 mg/dL   GFR calc non Af Amer 82 (*) >90 mL/min   GFR calc Af Amer >90  >90 mL/min   Comment: (NOTE)     The eGFR has been calculated using the CKD EPI equation.     This calculation has not been validated in all clinical situations.     eGFR's persistently <90  mL/min signify possible Chronic Kidney     Disease.  TROPONIN I     Status: None   Collection Time    04/28/14  6:02 PM      Result Value Ref Range   Troponin I <0.30  <0.30 ng/mL   Comment:            Due to the release kinetics of cTnI,     a negative result within the first hours     of the onset of symptoms does not rule out     myocardial infarction with certainty.     If myocardial infarction is still suspected,     repeat the test at appropriate intervals.    Radiology Reports: Dg Chest 1 View  04/28/2014   CLINICAL DATA:  Pre MRI screening, altered mental status, patient puncture she has a pacemaker  EXAM: CHEST - 1 VIEW  COMPARISON:  Thoracic spine radiographs- 04/23/2014  FINDINGS: Grossly unchanged cardiac silhouette and mediastinal contours given persistently reduced lung volumes. Minimal bibasilar heterogeneous opacities, left greater than right. Mild pulmonary venous congestion without frank edema no pleural effusion or pneumothorax. Grossly unchanged bones including sequela of long segment lower thoracic / upper lumbar paraspinal fusion. Regional soft tissues appear normal.  IMPRESSION: 1. No evidence of cardiac pacemaker. 2. Bibasilar  atelectasis without definite acute cardiopulmonary disease on this hypoventilated AP supine examination. 3. Post long segment lower thoracic/upper lumbar paraspinal fusion, incompletely evaluated   Electronically Signed   By: Sandi Mariscal M.D.   On: 04/28/2014 18:40   Mr Thoracic Spine W Wo Contrast  04/28/2014   CLINICAL DATA:  Thoracic and lumbar spine surgery on 06/15. Worsening pain. Altered mental status.  EXAM: MRI THORACIC AND LUMBAR SPINE WITHOUT AND WITH CONTRAST  TECHNIQUE: Multiplanar and multiecho pulse sequences of the thoracic and lumbar spine were obtained without and with intravenous contrast.  CONTRAST:  12m MULTIHANCE GADOBENATE DIMEGLUMINE 529 MG/ML IV SOLN  COMPARISON:  Lumbar spine MRI 02/27/2014  FINDINGS: Images are mildly two  moderately degraded by motion artifact.  Thoracic vertebral alignment is normal. Thoracic vertebral body heights are preserved. There is mild to moderate disc space narrowing with associated degenerative marrow changes from T4-T9. The thoracic spinal cord is normal in caliber and signal although is partially obscured by susceptibility artifact in the lower thoracic spine. A small amount of patchy signal abnormality is noted in the dependent right lung, incompletely evaluated but may reflect subsegmental atelectasis.  Patient's prior posterior lumbar fusion has been extended proximally since the prior MRI, with bilateral pedicle screws now present from T10-L3. Superficial gas and fluid collection along the surgical incision measures approximately 12 cm in craniocaudal length and has greatest axial measurements of 2.4 x 2.6 cm at the T11 level. There is mild surrounding enhancement. Fluid collection at the L1-2 laminectomy site measures 6.7 x 2.4 cm (transverse by AP). Small dorsal epidural fluid collection/hematoma at the L1-2 level measures 15 x 6 mm (series 11, image 26). This fluid collection and a circumferential L1-2 disc bulge result in moderate spinal stenosis. A small amount of dorsal epidural fluid/hematoma at L2-3 measures 10 x 5 mm without stenosis.  Evaluation of the spinal canal in the lower thoracic spine is limited by susceptibility artifact from fixation hardware. The right T12 pedicle screw appears to traverse the right lateral recess. The conus medullaris terminates at L1.  Grade 1 anterolisthesis of L3 on L4 and L4 on L5 is unchanged. Sequelae of prior laminectomies are again identified from L2-3 to L4-5. Mild lateral recess narrowing at L4-5 is unchanged. L5-S1 interbody fusion is noted. There is mild bilateral neural foraminal narrowing at L5-S1, right greater than left.  IMPRESSION: 1. Interval thoracolumbar posterior fusion. 15 mm dorsal epidural fluid collection/hematoma at L1-2 which,  together with a disc bulge, results in moderate spinal stenosis. 2. Fluid collections at the L1-2 laminectomy site and superficially along the surgical incision, which most likely represent postoperative hematoma/seromas, although superimposed infection cannot be excluded. 3. The right T12 pedicle screw appears to traverse the right lateral recess.   Electronically Signed   By: ALogan Bores  On: 04/28/2014 20:19   Mr Lumbar Spine W Wo Contrast  04/28/2014   CLINICAL DATA:  Thoracic and lumbar spine surgery on 06/15. Worsening pain. Altered mental status.  EXAM: MRI THORACIC AND LUMBAR SPINE WITHOUT AND WITH CONTRAST  TECHNIQUE: Multiplanar and multiecho pulse sequences of the thoracic and lumbar spine were obtained without and with intravenous contrast.  CONTRAST:  173mMULTIHANCE GADOBENATE DIMEGLUMINE 529 MG/ML IV SOLN  COMPARISON:  Lumbar spine MRI 02/27/2014  FINDINGS: Images are mildly two moderately degraded by motion artifact.  Thoracic vertebral alignment is normal. Thoracic vertebral body heights are preserved. There is mild to moderate disc space narrowing with associated degenerative marrow changes from T4-T9.  The thoracic spinal cord is normal in caliber and signal although is partially obscured by susceptibility artifact in the lower thoracic spine. A small amount of patchy signal abnormality is noted in the dependent right lung, incompletely evaluated but may reflect subsegmental atelectasis.  Patient's prior posterior lumbar fusion has been extended proximally since the prior MRI, with bilateral pedicle screws now present from T10-L3. Superficial gas and fluid collection along the surgical incision measures approximately 12 cm in craniocaudal length and has greatest axial measurements of 2.4 x 2.6 cm at the T11 level. There is mild surrounding enhancement. Fluid collection at the L1-2 laminectomy site measures 6.7 x 2.4 cm (transverse by AP). Small dorsal epidural fluid collection/hematoma at the  L1-2 level measures 15 x 6 mm (series 11, image 26). This fluid collection and a circumferential L1-2 disc bulge result in moderate spinal stenosis. A small amount of dorsal epidural fluid/hematoma at L2-3 measures 10 x 5 mm without stenosis.  Evaluation of the spinal canal in the lower thoracic spine is limited by susceptibility artifact from fixation hardware. The right T12 pedicle screw appears to traverse the right lateral recess. The conus medullaris terminates at L1.  Grade 1 anterolisthesis of L3 on L4 and L4 on L5 is unchanged. Sequelae of prior laminectomies are again identified from L2-3 to L4-5. Mild lateral recess narrowing at L4-5 is unchanged. L5-S1 interbody fusion is noted. There is mild bilateral neural foraminal narrowing at L5-S1, right greater than left.  IMPRESSION: 1. Interval thoracolumbar posterior fusion. 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 2. Fluid collections at the L1-2 laminectomy site and superficially along the surgical incision, which most likely represent postoperative hematoma/seromas, although superimposed infection cannot be excluded. 3. The right T12 pedicle screw appears to traverse the right lateral recess.   Electronically Signed   By: Logan Bores   On: 04/28/2014 20:19    Electrocardiogram: EKG shows sinus rhythm at 85 beats per minute. Normal axis. Normal Intervals. No ST changes. No concerning T-wave changes.  Problem List  Principal Problem:   Acute encephalopathy Active Problems:   S/P lumbar fusion   UTI (lower urinary tract infection)   Assessment: This is a 73 year old, Caucasian female, who underwent a lumbar fusion surgery on June 15 [place] by Dr. Patrice Paradise, who presents with worsening confusion for the last 4 days. She does not have any focal neurological deficits. I think her mental status changes are more likely secondary to medications and probably also due  to UTI.  Plan: #1 acute encephalopathy: Most likely secondary to medication changes and UTI. We will hold all of her new medications for now. We will treat her UTI. She does not have any focal neurological deficits. There has been no falls or injuries recently. There is no need for neuroimaging at this time. Check a TSH in the morning. Fall precautions. Might require a sitter if agitation persists. Avoid benzodiazepines as much as possible. Use Haldol if absolutely necessary.  #2 urinary tract infection: Urine cultures pending. Continue ceftriaxone daily.  #3 recent spinal fusion surgery: Neurologically, she appears to be intact in the lower extremities. Patient had MRI of the thoracic and lumbar spine done this evening, which revealed fluid collections at the operative site. The images have been reviewed by her surgeon, Dr. Patrice Paradise. Apparently this is to be expected considering her recent surgery. Surgeon was not concerned. Her WBC is normal. She is afebrile. There was some  redness along the incision site, but no drainage. I didn't see any immediate reason for transfer this patient. Dr. Patrice Paradise requests that we talk to him again in the morning to update him. [phone]. In the meantime, we'll get physical and occupational therapy to evaluate her.  #4 normocytic anemia: Continue to monitor hemoglobin. No evidence of active bleeding.  DVT Prophylaxis: SCDs Code Status: Full code Family Communication: Discussed with the son in detail  Disposition Plan: Admit to telemetry.   Further management decisions will depend on results of further testing and patient's response to treatment.   [facility]  [facility] Pager [phone]  If 7PM-7AM, please contact night-coverage [email or web address] Password [facility]  04/28/2014, 10:47 PM  Disclaimer: This note was dictated with voice recognition software. Similar sounding words can inadvertently be transcribed and may not be corrected upon review.

## 2014-04-28 NOTE — ED Notes (Signed)
Patient transported to MRI 

## 2014-04-28 NOTE — ED Notes (Signed)
Surgery 15th of June.

## 2014-04-28 NOTE — ED Notes (Signed)
464 ML urine in bladder per scan, Dr. Tomi Bamberger aware, new orders given.

## 2014-04-28 NOTE — ED Provider Notes (Signed)
CSN: [id number]     Arrival date & time 04/28/14  1644 History   This chart was scribed for Janice Norrie, MD by Girtha Hake, ED Scribe. The patient was seen in [place]. The patient's care was started at 5:00 PM.     Chief Complaint  Patient presents with  . Altered Mental Status   Patient is a Level 5 Caveat due to her altered mental status.   Patient is a 73 y.o. female presenting with altered mental status. The history is provided by the spouse. No language interpreter was used.  Altered Mental Status Presenting symptoms: confusion   Severity:  Moderate Most recent episode:  Today Duration:  4 days Timing:  Intermittent Progression:  Worsening Chronicity:  New Context comment:  Back surgery one week ago  HPI Comments: Brittany Mays is a 73 y.o. female with a history of depression and anxiety who presents to the Emergency Department complaining of altered mental status beginning four days ago. Her husband reports that she has been confused for the past four days. Patient is aware that she is in the hospital but is unsure of which one. Husband states that he has never seen her this confused before. He also reports that she has complained of generalized pain for the past four days but that she cannot specify where the pain is.  Patient's husband reports that she had back surgery on 04/20/14 at [facility] and returned home on 04/24/14. Surgery was performed by Dr. Patrice Paradise. Patient's husband reports that she was ambulatory yesterday but that he was unable to get her out of bed today. Patient's husband reports that her condition has progressively worsened since returning from the hospital one week ago ago. He states her activity level has been slowly getting less. She did eat yesterday. He states she's been tremulous and having trouble talking for the past few days. Today they were supposed to have an appointment to have an MRI of her back however patient was unable to ambulate or get  out of bed today. They called her orthopedist office and they were told to change some of her medications. Her husband reports that since returning from the hospital, she has taken Tramadol for the pain. Patient began taking Flexeril 2-3 days ago. She has also recently begun to take Xanax, Baclofen, and Gabapentin. Husband denies cough. Patient complains of her whole body hurting.  PCP is Dr. Mariea Clonts.  Orthopedist Dr Patrice Paradise  Past Medical History  Diagnosis Date  . Depression   . Anxiety   . Insomnia   . Pain   . Cataract   . Osteoarthritis of both knees     s/p knee replacements  . Lumbar spinal stenosis     s/p laminectomy  . Hyperparathyroidism, primary     s/p parathyroidectomy  . Rectal prolapse     s/p repair   Past Surgical History  Procedure Laterality Date  . Abdominal hysterectomy    . Knee surgery Bilateral     Knee Replacements  . Spine surgery    . Parathyroidectomy    . Colonoscopy  2013   Family History  Problem Relation Age of Onset  . COPD Father   . Heart failure Father   . Diabetes Son    History  Substance Use Topics  . Smoking status: Current Every Day Smoker -- 1.00 packs/day for 50 years    Types: Cigarettes  . Smokeless tobacco: Not on file  . 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   Psychiatric/Behavioral: Positive for confusion. The patient is nervous/anxious.   All other systems reviewed and are negative.     Allergies  Nsaids; Sodium pentobarbital; Codeine; and Fentanyl  Home Medications   Prior to Admission medications   Medication Sig Start Date End Date Taking? Authorizing Provider  ALPRAZolam Duanne Moron) 0.5 MG tablet Take 1 tablet by mouth 3 (three) times daily as needed for anxiety.  04/23/14  Yes Historical Provider, MD  baclofen (LIORESAL) 10 MG tablet Take 20 mg  by mouth every 8 (eight) hours.  04/24/14  Yes Historical Provider, MD  calcium-vitamin D (OSCAL WITH D) 500-200 MG-UNIT per tablet Take 1 tablet by mouth daily with breakfast.    Yes Historical Provider, MD  gabapentin (NEURONTIN) 300 MG capsule Take 1-2 capsules by mouth 3 (three) times daily. 03/18/14  Yes Historical Provider, MD  Multiple Vitamins-Minerals (WOMENS MULTI) CAPS Take one tablet once daily   Yes Historical Provider, MD  oxyCODONE-acetaminophen (PERCOCET) 10-325 MG per tablet Take 1 tablet by mouth every 4 (four) hours as needed for pain.  03/20/14  Yes Historical Provider, MD  traMADol (ULTRAM) 50 MG tablet Take 50-100 mg by mouth 3 (three) times daily as needed. For pain 04/23/14  Yes Historical Provider, MD  AMBULATORY NON FORMULARY MEDICATION Medication Name: Somnapure (sleep aid) 2 by mouth at bedtime    Historical Provider, MD  fish oil-omega-3 fatty acids 1000 MG capsule Take 1 g by mouth daily.     Historical Provider, MD  Red Yeast Rice 600 MG TABS Take 600 mg by mouth 2 (two) times daily.     Historical Provider, MD  vitamin B-12 (CYANOCOBALAMIN) 100 MCG tablet Take 100 mcg by mouth daily.     Historical Provider, MD   Triage Vitals: BP 142/83  Pulse 80  Temp(Src) 98.4 F (36.9 C) (Rectal)  SpO2 95%  Vital signs normal   Physical Exam  Nursing note and vitals reviewed. Constitutional: She appears well-developed and well-nourished.  Non-toxic appearance. She does not appear ill. She appears distressed.  Patient was crying intermittently. She is tremulous.   HENT:  Head: Normocephalic and atraumatic.  Right Ear: External ear normal.  Left Ear: External ear normal.  Nose: Nose normal. No mucosal edema or rhinorrhea.  Mouth/Throat: Oropharynx is clear and moist and mucous membranes are normal. No dental abscesses or uvula swelling.  Tongue is dry.   Eyes: Conjunctivae and EOM are normal. Pupils are equal, round, and reactive to light.  Neck: Normal range of motion and  full passive range of motion without pain. Neck supple.  Cardiovascular: Normal rate, regular rhythm and normal heart sounds.  Exam reveals no gallop and no friction rub.   No murmur heard. Pulmonary/Chest: Effort normal and breath sounds normal. No respiratory distress. She has no wheezes. She has no rhonchi. She has no rales. She exhibits no tenderness and no crepitus.  Abdominal: Soft. Normal appearance and bowel sounds are normal. She exhibits no distension. There is no tenderness. There is no rebound and no guarding.  Musculoskeletal: Normal range of motion. She exhibits no edema and no tenderness.  Moves all extremities well. Patient has a long, surgical incision from her mid-thoracic spine to her lumbar spine  with mild redness at the edges. No drainage.  Neurological: She has normal strength. No cranial nerve deficit.  Skin: Skin is warm, dry and intact. No rash noted. No erythema. No pallor.  Psychiatric: Her mood appears anxious. Her speech is rapid and/or pressured. She is agitated. Cognition and memory are impaired.  Generally confused. Patient reiterates repetitive statements that are not applicable to the situation or questions being asked.     ED Course  Procedures (including critical care time) Medications  0.9 %  sodium chloride infusion ( Intravenous Not Given 04/28/14 2150)  sodium chloride 0.9 % bolus 1,000 mL (0 mLs Intravenous Stopped 04/28/14 2150)  LORazepam (ATIVAN) injection 0.5 mg (0.5 mg Intravenous Given 04/28/14 1751)  cefTRIAXone (ROCEPHIN) 1 g in dextrose 5 % 50 mL IVPB (0 g Intravenous Stopped 04/28/14 2041)  gadobenate dimeglumine (MULTIHANCE) injection 15 mL (15 mLs Intravenous Contrast Given 04/28/14 1941)    DIAGNOSTIC STUDIES: Oxygen Saturation is 95% on room air, adequate by my interpretation.    COORDINATION OF CARE: 5:16 PM-Discussed treatment plan which includes IV fluids, Ativan, MRI of thoracic and lumbar spine, EKG, rectal temperature check, and labs  with pt's husband at bedside and pt's husband agreed to plan.   Patient was given 1000 cc fluid bolus for apparent dehydration on her exam. She also was given Ativan for her anxiousness and tremulousness. After review of her laboratory results she was given Rocephin 1 g IV for her urinary tract infection.  On patient's arrival she told the nurses she had to urinate. An in and out cath was done and she had 700 cc of urine in her bladder. Nurses also report she had a very wet soaked diaper on on arrival to the ED.  Nurses later on state patient has had continuous urinary incontinence. Bladder scan was done which showed 464 cc of urine. A Foley catheter was inserted for urinary retention. Dr. Patrice Paradise and was aware of this.  2045 patient discussed with Dr. Patrice Paradise, her orthopedic surgeon. He is going to look at her MRI and call me back.  2102 Dr. Patrice Paradise has reviewed her MRI. He feels that the fluid collection is normal postoperative changes for surgery that was done only one week ago. He feels that patient could be admitted here for medical evaluation, he does not feel she's having any acute problems directly related to her surgery. He states the hospital skin contact him tomorrow to see how she is doing.  2152 Dr. Maryland Pink is going to dilate patient for admission.  Results for orders placed during the hospital encounter of 04/28/14  URINALYSIS, ROUTINE W REFLEX MICROSCOPIC      Result Value Ref Range   Color, Urine YELLOW  YELLOW   APPearance CLEAR  CLEAR   Specific Gravity, Urine 1.010  1.005 - 1.030   pH 6.5  5.0 - 8.0   Glucose, UA NEGATIVE  NEGATIVE mg/dL   Hgb urine dipstick NEGATIVE  NEGATIVE   Bilirubin Urine NEGATIVE  NEGATIVE   Ketones, ur NEGATIVE  NEGATIVE mg/dL   Protein, ur NEGATIVE  NEGATIVE mg/dL   Urobilinogen, UA 0.2  0.0 - 1.0 mg/dL   Nitrite POSITIVE (*) NEGATIVE   Leukocytes, UA NEGATIVE  NEGATIVE  CBC WITH DIFFERENTIAL      Result Value Ref Range   WBC 6.1  4.0 - 10.5 K/uL    RBC 3.01 (*) 3.87 - 5.11 MIL/uL   Hemoglobin 10.0 (*) 12.0 - 15.0 g/dL   HCT 29.2 (*) 36.0 -  46.0 %   MCV 97.0  78.0 - 100.0 fL   MCH 33.2  26.0 - 34.0 pg   MCHC 34.2  30.0 - 36.0 g/dL   RDW 16.0 (*) 11.5 - 15.5 %   Platelets 338  150 - 400 K/uL   Neutrophils Relative % 58  43 - 77 %   Neutro Abs 3.5  1.7 - 7.7 K/uL   Lymphocytes Relative 29  12 - 46 %   Lymphs Abs 1.8  0.7 - 4.0 K/uL   Monocytes Relative 10  3 - 12 %   Monocytes Absolute 0.6  0.1 - 1.0 K/uL   Eosinophils Relative 3  0 - 5 %   Eosinophils Absolute 0.2  0.0 - 0.7 K/uL   Basophils Relative 0  0 - 1 %   Basophils Absolute 0.0  0.0 - 0.1 K/uL  COMPREHENSIVE METABOLIC PANEL      Result Value Ref Range   Sodium 142  137 - 147 mEq/L   Potassium 3.8  3.7 - 5.3 mEq/L   Chloride 102  96 - 112 mEq/L   CO2 30  19 - 32 mEq/L   Glucose, Bld 93  70 - 99 mg/dL   BUN 16  6 - 23 mg/dL   Creatinine, Ser 0.72  0.50 - 1.10 mg/dL   Calcium 8.5  8.4 - 10.5 mg/dL   Total Protein 5.8 (*) 6.0 - 8.3 g/dL   Albumin 2.6 (*) 3.5 - 5.2 g/dL   AST 30  0 - 37 U/L   ALT 21  0 - 35 U/L   Alkaline Phosphatase 59  39 - 117 U/L   Total Bilirubin 0.2 (*) 0.3 - 1.2 mg/dL   GFR calc non Af Amer 82 (*) >90 mL/min   GFR calc Af Amer >90  >90 mL/min  TROPONIN I      Result Value Ref Range   Troponin I <0.30  <0.30 ng/mL  URINE MICROSCOPIC-ADD ON      Result Value Ref Range   Squamous Epithelial / LPF FEW (*) RARE   WBC, UA 11-20  <3 WBC/hpf   Bacteria, UA MANY (*) RARE   Laboratory interpretation all normal except except UTI, mild anemia    Imaging Review Dg Chest 1 View  04/28/2014   CLINICAL DATA:  Pre MRI screening, altered mental status, patient puncture she has a pacemaker  EXAM: CHEST - 1 VIEW  COMPARISON:  Thoracic spine radiographs- 04/23/2014  FINDINGS: Grossly unchanged cardiac silhouette and mediastinal contours given persistently reduced lung volumes. Minimal bibasilar heterogeneous opacities, left greater than right. Mild  pulmonary venous congestion without frank edema no pleural effusion or pneumothorax. Grossly unchanged bones including sequela of long segment lower thoracic / upper lumbar paraspinal fusion. Regional soft tissues appear normal.  IMPRESSION: 1. No evidence of cardiac pacemaker. 2. Bibasilar atelectasis without definite acute cardiopulmonary disease on this hypoventilated AP supine examination. 3. Post long segment lower thoracic/upper lumbar paraspinal fusion, incompletely evaluated   Electronically Signed   By: Sandi Mariscal M.D.   On: 04/28/2014 18:40   Mr Thoracic Spine W Wo Contrast  Mr Lumbar Spine W Wo Contrast  04/28/2014   CLINICAL DATA:  Thoracic and lumbar spine surgery on 06/15. Worsening pain. Altered mental status.  EXAM: MRI THORACIC AND LUMBAR SPINE WITHOUT AND WITH CONTRAST  TECHNIQUE: Multiplanar and multiecho pulse sequences of the thoracic and lumbar spine were obtained without and with intravenous contrast.  CONTRAST:  96mL MULTIHANCE GADOBENATE DIMEGLUMINE 529 MG/ML  IV SOLN  COMPARISON:  Lumbar spine MRI 02/27/2014  FINDINGS: Images are mildly two moderately degraded by motion artifact.  Thoracic vertebral alignment is normal. Thoracic vertebral body heights are preserved. There is mild to moderate disc space narrowing with associated degenerative marrow changes from T4-T9. The thoracic spinal cord is normal in caliber and signal although is partially obscured by susceptibility artifact in the lower thoracic spine. A small amount of patchy signal abnormality is noted in the dependent right lung, incompletely evaluated but may reflect subsegmental atelectasis.  Patient's prior posterior lumbar fusion has been extended proximally since the prior MRI, with bilateral pedicle screws now present from T10-L3. Superficial gas and fluid collection along the surgical incision measures approximately 12 cm in craniocaudal length and has greatest axial measurements of 2.4 x 2.6 cm at the T11 level. There  is mild surrounding enhancement. Fluid collection at the L1-2 laminectomy site measures 6.7 x 2.4 cm (transverse by AP). Small dorsal epidural fluid collection/hematoma at the L1-2 level measures 15 x 6 mm (series 11, image 26). This fluid collection and a circumferential L1-2 disc bulge result in moderate spinal stenosis. A small amount of dorsal epidural fluid/hematoma at L2-3 measures 10 x 5 mm without stenosis.  Evaluation of the spinal canal in the lower thoracic spine is limited by susceptibility artifact from fixation hardware. The right T12 pedicle screw appears to traverse the right lateral recess. The conus medullaris terminates at L1.  Grade 1 anterolisthesis of L3 on L4 and L4 on L5 is unchanged. Sequelae of prior laminectomies are again identified from L2-3 to L4-5. Mild lateral recess narrowing at L4-5 is unchanged. L5-S1 interbody fusion is noted. There is mild bilateral neural foraminal narrowing at L5-S1, right greater than left.  IMPRESSION: 1. Interval thoracolumbar posterior fusion. 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 2. Fluid collections at the L1-2 laminectomy site and superficially along the surgical incision, which most likely represent postoperative hematoma/seromas, although superimposed infection cannot be excluded. 3. The right T12 pedicle screw appears to traverse the right lateral recess.   Electronically Signed   By: Logan Bores   On: 04/28/2014 20:19     EKG Interpretation   Date/Time:  Tuesday April 28 2014 17:54:54 EDT Ventricular Rate:  85 PR Interval:  146 QRS Duration: 82 QT Interval:  393 QTC Calculation: 467 R Axis:   4 Text Interpretation:  Sinus rhythm Low voltage, precordial leads Abnormal  R-wave progression, early transition Baseline wander in lead(s) V4 No old  tracing to compare Confirmed by KNAPP  MD-I, IVA ([id number]) on 04/28/2014  6:00:21 PM      MDM   Final  diagnoses:  Acute cystitis without hematuria  Altered mental status, unspecified altered mental status type  Urinary retention   Plan admission   Rolland Porter, MD, FACEP   I personally performed the services described in this documentation, which was scribed in my presence. The recorded information has been reviewed and considered.  Rolland Porter, MD, Abram Sander    Janice Norrie, MD 04/28/14 2252

## 2014-04-28 NOTE — ED Notes (Signed)
Lumbar fusions  6 levels on the 15th  Of April.  Family states her mental status is getting worse since surgery.

## 2014-04-28 NOTE — ED Notes (Signed)
Patient is in MRI at this time

## 2014-04-29 DIAGNOSIS — M549 Dorsalgia, unspecified: Secondary | ICD-10-CM

## 2014-04-29 DIAGNOSIS — N3000 Acute cystitis without hematuria: Secondary | ICD-10-CM

## 2014-04-29 DIAGNOSIS — R339 Retention of urine, unspecified: Secondary | ICD-10-CM

## 2014-04-29 HISTORY — DX: Dorsalgia, unspecified: M54.9

## 2014-04-29 LAB — COMPREHENSIVE METABOLIC PANEL
ALK PHOS: 64 U/L (ref 39–117)
ALT: 21 U/L (ref 0–35)
AST: 34 U/L (ref 0–37)
Albumin: 2.7 g/dL — ABNORMAL LOW (ref 3.5–5.2)
BILIRUBIN TOTAL: 0.3 mg/dL (ref 0.3–1.2)
BUN: 11 mg/dL (ref 6–23)
CO2: 29 mEq/L (ref 19–32)
Calcium: 8.4 mg/dL (ref 8.4–10.5)
Chloride: 101 mEq/L (ref 96–112)
Creatinine, Ser: 0.65 mg/dL (ref 0.50–1.10)
GFR calc non Af Amer: 85 mL/min — ABNORMAL LOW (ref >90)
Glucose, Bld: 112 mg/dL — ABNORMAL HIGH (ref 70–99)
POTASSIUM: 3.7 meq/L (ref 3.7–5.3)
SODIUM: 142 meq/L (ref 137–147)
Total Protein: 6.2 g/dL (ref 6.0–8.3)

## 2014-04-29 LAB — RAPID URINE DRUG SCREEN, HOSP PERFORMED
AMPHETAMINES: NOT DETECTED
BARBITURATES: NOT DETECTED
BENZODIAZEPINES: NOT DETECTED
Cocaine: NOT DETECTED
Opiates: NOT DETECTED
Tetrahydrocannabinol: NOT DETECTED

## 2014-04-29 LAB — CBC
HCT: 30.9 % — ABNORMAL LOW (ref 36.0–46.0)
HEMOGLOBIN: 10.2 g/dL — AB (ref 12.0–15.0)
MCH: 32 pg (ref 26.0–34.0)
MCHC: 33 g/dL (ref 30.0–36.0)
MCV: 96.9 fL (ref 78.0–100.0)
Platelets: 350 10*3/uL (ref 150–400)
RBC: 3.19 MIL/uL — AB (ref 3.87–5.11)
RDW: 16 % — ABNORMAL HIGH (ref 11.5–15.5)
WBC: 6.8 10*3/uL (ref 4.0–10.5)

## 2014-04-29 LAB — TSH: TSH: 0.604 u[IU]/mL (ref 0.350–4.500)

## 2014-04-29 MED ORDER — HYDROMORPHONE HCL PF 1 MG/ML IJ SOLN
0.5000 mg | INTRAMUSCULAR | Status: DC | PRN
  Administered 2014-04-29 (×2): 0.5 mg via INTRAVENOUS
  Filled 2014-04-29 (×2): qty 1

## 2014-04-29 MED ORDER — CIPROFLOXACIN HCL 250 MG PO TABS
500.0000 mg | ORAL_TABLET | Freq: Two times a day (BID) | ORAL | Status: DC
  Administered 2014-04-29 – 2014-05-01 (×4): 500 mg via ORAL
  Filled 2014-04-29 (×4): qty 2

## 2014-04-29 NOTE — Progress Notes (Signed)
Patient has been tearful and crying all night.  Have given patient prn medications as ordered, and have spent extended periods of time in patient's room.  Patient is alert and understands situation and can answer questions appropriately when calm, but 90% of time but is tearful and crying.  Patient does have slight aphasia when speaking, but tries to correct herself.  Called patient's son, no response at this time.  Will continue to support patient.

## 2014-04-29 NOTE — Care Management Utilization Note (Signed)
UR completed 

## 2014-04-29 NOTE — Evaluation (Signed)
Physical Therapy Evaluation Patient Details Name: ONELL MCMATH MRN: [id number] DOB: November 04, 1941 Today's Date: 04/29/2014   History of Present Illness  MARYLENE MASEK is a 73 y.o. female with a past medical history of depression, who had spinal stenosis in the lumbar area and underwent spinal fusion surgery (from T10-L3 per pt report) on June 15 by Dr. Patrice Paradise in [facility]. She was released from the hospital on June 17. She has been doing okay at home. She has been ambulating with a walker. Over the last few days the patient's family has noted worsening confusion. This has progressively gotten worse and today they decided to bring her into the hospital. There's been no fever. No chills. Patient is very confused and agitated at times. Crying at times and unable to provide much history. Her son is at the bedside, but not much information was available from him either. According to the patient's husband who spoke to the ED physician patient was started on multiple new medications since discharge, including tramadol, flexible, Xanax, baclofen, and gabapentin. History is extremely limited.  Clinical Impression  Pt is a 73 year old female who presents to physical therapy for assessment of functional mobility skills after undergoing back surgery.  Pt underwent T10-L3 spinal fusion on 04/20/14, and was referred home with home health services; however due to increasing confusion and pain pt has returned to the hospital.  Pt extremely anxious with all movement, and nervous that all movement will increase pain.  Pt required reassurance and encouragement to participate in PT evaluation.  Pt was able to correctly verbalize 3/3 back precautions, and verbalize log rolling technique to get out of bed.  Pt required continuous VC for technique, and mod assist to complete bed mobility.  Pt was able to transfers into standing with min/mod assist, though due to anxiety/hyperventilation ambulation forward was not attempted.  After  VC for deep breathing, pt was able to amb ~2 feet sideways to the [facility] prior to sitting.  Recommend continued PT while in the hospital to address strengthening, activity tolerance, and pain management to improve functional mobility skills with transition to SNF for continued rehab.  No DME recommend, SNF will provide DME if needed.     Follow Up Recommendations SNF    Equipment Recommendations  None recommended by PT       Precautions / Restrictions Precautions Precautions: Back;Fall Precaution Booklet Issued: No Precaution Comments: Pt able to verbalize back precautions 3/3 Restrictions Weight Bearing Restrictions: No      Mobility  Bed Mobility Overal bed mobility: Needs Assistance Bed Mobility: Supine to Sit;Sit to Sidelying     Supine to sit: Mod assist;Max assist (LOG ROLLING, mod assist for LE, max assist for trunk)   Sit to sidelying: Mod assist;Min guard (Mod assist for LE, min guard for trunk)    Transfers Overall transfer level: Needs assistance Equipment used: Rolling walker (2 wheeled) Transfers: Sit to/from Stand Sit to Stand: Mod assist            Ambulation/Gait             General Gait Details: Unable to ambulate forward as pt too anxious to attempt (required VC for deep breathing to avoid hyperventilation). Pt was able to side step ~2 feet with min assist to [facility].          Balance Overall balance assessment: Needs assistance Sitting-balance support: Feet supported;Bilateral upper extremity supported (Min/mod assist to maintain) Sitting balance-Leahy Scale: [place]     Standing  balance support: Bilateral upper extremity supported;During functional activity (Min assist to maintain standing) Standing balance-Leahy Scale: Fair                               Pertinent Vitals/Pain Pt reports moderate to severe complaints of pain in the back (surgical area).  Pt repositioned in bed into sidelying after eval to increase comfort.       Home Living Family/patient expects to be discharged to:: Skilled nursing facility Living Arrangements: Spouse/significant other                    Prior Function Level of Independence: Independent with assistive device(s)         Comments: Used a rolling walker since surgery for amb, though has not attempted amb secondary to pain in the past couple days.         Extremity/Trunk Assessment               Lower Extremity Assessment: Generalized weakness;LLE deficits/detail;RLE deficits/detail         Communication   Communication: No difficulties  Cognition Arousal/Alertness: Awake/alert Behavior During Therapy: Anxious (Extremely anxious) Overall Cognitive Status: Within Functional Limits for tasks assessed                               Assessment/Plan    PT Assessment Patient needs continued PT services  PT Diagnosis Difficulty walking;Generalized weakness   PT Problem List Decreased strength;Decreased activity tolerance;Decreased balance;Decreased mobility;Pain  PT Treatment Interventions Gait training;Functional mobility training;Therapeutic activities;Therapeutic exercise;Neuromuscular re-education;Modalities;Manual techniques   PT Goals (Current goals can be found in the Care Plan section) Acute Rehab PT Goals Patient Stated Goal: Decrease pain, fear with movement PT Goal Formulation: With patient/family Time For Goal Achievement: 05/13/14 Potential to Achieve Goals: Good    Frequency Min 3X/week   Barriers to discharge Decreased caregiver support         End of Session Equipment Utilized During Treatment: Gait belt Activity Tolerance: Patient limited by fatigue;Patient limited by pain Patient left: in bed;with nursing/sitter in room;with family/visitor present;with call bell/phone within reach Nurse Communication: Mobility status         Time: 0277-4128 PT Time Calculation (min): 34 min   Charges:   PT  Evaluation $Initial PT Evaluation Tier I: 1 Procedure          WOODWORTH,STEPHANIE 04/29/2014, 4:11 PM

## 2014-04-29 NOTE — Progress Notes (Signed)
Patient seen, independently examined and chart reviewed. I agree with exam, assessment and plan discussed with Dyanne Carrel, NP.  Summary: 73 year old woman underwent spinal fusion surgery June 15 at [facility], released from the hospital June 17. Shortly thereafter she developed confusion without fever; crying spells. Apparently was recently started on new medications including tramadol, Flexeril, Xanax, baclofen and gabapentin.  PMH Depression, anxiety  Subjective: Tearful and crying throughout the night per RN report.  During interview however patient calm. She complains of some back pain. Poor appetite. No numbness, paresthesias. No weakness of arm or leg. No bowel dysfunction.  Objective: Afebrile, vital signs stable. No hypoxia.  Gen. Appears calm and comfortable.  Psych. Alert, oriented to self, location, year. Not month. She becomes tearful at times, cries.   Cardiovascular. Tachycardic, regular rhythm. No murmur, rub or gallop. No lower extremity edema. Telemetry sinus tachycardia, no arrhythmias.  Respiratory. Clear to auscultation bilaterally. No wheezes, rales or rhonchi. Normal respiratory effort.  Skin. Back incision appears to be healing well without any evidence of erythema or edema.  Musculoskeletal. Moves all extremities to command. Excellent bilateral lower extremity strength, easily lifts both legs off the bed. Grossly normal sensation both legs. No tremors noted.  Neurologic. Nonfocal as above   Chemistry: Complete metabolic panel unremarkable  Heme: Hemoglobin stable 10.2  ID: Urinalysis grossly positive  1. Acute encephalopathy. Appears resolved at this point. Likely multifactorial, polypharmacy as well as UTI. 2. Difficulty ambulating by report. Excellent strength in exam. Physical therapy evaluation. No physical objective findings this or just neurologic impairment. Per discussion with her daughter-in-law who has been helping her at home the  patient was ambulating normally until yesterday. 3. Urinary retention, Foley catheter inserted in the emergency department, apparently orthopedic surgeon was aware of this. Suspect related to polypharmacy and recent surgery. 4. Status post back surgery 04/20/2014 and [facility], discharge 6/19 2015. MRI showed fluid collection which was felt to be normal postoperative changes from surgery for her orthopedic surgeon. Dr. Patrice Paradise [phone] 5. Depression 6. Anxiety   Long discussion with patient, husband as well as daughter-in-law. Patient and husband had great difficulty providing specific details in regard to her hospitalization, discharge plan and clinical course at home. History is vague and contradictory. It is not clear what medication she is on. In discussion with her daughter-in-law, the patient's long history of anxiety and attention seeking as well as polypharmacy. She utilizes multiple pharmacies and multiple doctors.  At this point see no objective evidence of postsurgical complication, neurologic dysfunction. I suspect urinary retention his functional or related to polypharmacy.  Plan remove Foley catheter, voiding trial, change to oral antibiotics, physical therapy. Anticipate discharge 6/25.  Murray Hodgkins, MD [facility] [phone]

## 2014-04-29 NOTE — Clinical Social Work Psychosocial (Signed)
Clinical Social Work Department BRIEF PSYCHOSOCIAL ASSESSMENT 04/29/2014  Patient:  Brittany Mays,Brittany Mays     Account Number:  [id number]     Admit date:  04/28/2014  Clinical Social Worker:  CUNNINGHAM,ANNE, CLINICAL SOCIAL WORKER  Date/Time:  04/29/2014 05:00 PM  Referred by:  CSW  Date Referred:  04/29/2014 Referred for  SNF Placement   Other Referral:   Interview type:  Patient Other interview type:   Also spoke w daughter in law in room    PSYCHOSOCIAL DATA Living Status:  HUSBAND Admitted from facility:   Level of care:   Primary support name:  Brittany Mays Primary support relationship to patient:  SPOUSE Degree of support available:   Extended family supportive and involved w patient's care    CURRENT CONCERNS Current Concerns  Post-Acute Placement   Other Concerns:    SOCIAL WORK ASSESSMENT / PLAN CSW met w patient in room, assessment limited by patient's pain, confusion and some difficulty in finding words/expressing self due to physical condition.  Patient confirmed that she wants to pursue SNF rehab in order to recover more fully from recent spinal surgery.  Daughter in law is agreeable to SNF search at [facility], [facility] and [facility].  Patient frequently somewhat tearful and expresses frustration w her inability to express herself fully.  Lives in [place] w husband, children and 7 grandchildren live nearby.  Family cannot take care of patient at home due to other commitments, but want to remain involved w her care.  Hope that w SNF rehab she will be able to return home.    Agreeable to CSW initiating SNF bed search.   Assessment/plan status:  Psychosocial Support/Ongoing Assessment of Needs Other assessment/ plan:   Information/referral to community resources:   SNf list    PATIENT'S/FAMILY'S RESPONSE TO PLAN OF CARE: Patient had some difficulty w self expression, frustrated w continued difficulty w rehab from recent back surgery,  agreeable to rehab as recommended by PT.        Anne Cunningham, LCSW Clinical Social Worker [phone])  

## 2014-04-29 NOTE — Clinical Social Work Placement (Signed)
    Clinical Social Work Department CLINICAL SOCIAL WORK PLACEMENT NOTE 05/01/2014  Patient:  Brittany Mays, Brittany Mays  Account Number:  [id number] Admit date:  04/28/2014  Clinical Social Worker:  Edwyna Shell, CLINICAL SOCIAL WORKER  Date/time:  04/29/2014 05:15 PM  Clinical Social Work is seeking post-discharge placement for this patient at the following level of care:   SKILLED NURSING   (*CSW will update this form in Epic as items are completed)   04/29/2014  Patient/family provided with [facility] Department of Clinical Social Work's list of facilities offering this level of care within the geographic area requested by the patient (or if unable, by the patient's family).  04/29/2014  Patient/family informed of their freedom to choose among providers that offer the needed level of care, that participate in Medicare, Medicaid or managed care program needed by the patient, have an available bed and are willing to accept the patient.  04/29/2014  Patient/family informed of [facility]' ownership interest in [facility], as well as of the fact that they are under no obligation to receive care at this facility.  PASARR submitted to EDS on 04/30/2014 PASARR number received on 05/01/2014  FL2 transmitted to all facilities in geographic area requested by pt/family on  04/29/2014 FL2 transmitted to all facilities within larger geographic area on   Patient informed that his/her managed care company has contracts with or will negotiate with  certain facilities, including the following:     Patient/family informed of bed offers received:  04/30/2014 Patient chooses bed at [facility] Physician recommends and patient chooses bed at    Patient to be transferred to [facility] on  05/01/2014 Patient to be transferred to facility by [facility] Staff via tunnel Patient and family notified of transfer on 05/01/2014 Name of family member notified:  Karlton Lemon  The following  physician request were entered in Epic:   Additional Comments:  Edwyna Shell, LCSW Clinical Social Worker [phone])

## 2014-04-29 NOTE — Progress Notes (Signed)
[facility] PROGRESS NOTE  Brittany Mays FYB:[id number] DOB: 1941-09-15 DOA: 04/28/2014 PCP: Hollace Kinnier, DO  Assessment: This is a 73 year old, Caucasian female, who underwent a lumbar fusion surgery on June 15 [place] by Dr. Patrice Paradise, who presents with worsening confusion for the last 4 days. She does not have any focal neurological deficits. It is presumed her mental status changes are more likely secondary to medications and probably also due to UTI.  Assessment/Plan: 1 acute encephalopathy: Most likely secondary to medication changes and UTI. Husband reports much improvement today and very close to baseline. Continue to hold home medications for now. Continue to treat UTI. Check a TSH 0.604. Minimize narcotics and avoid benzodiazepines as much as possible. Use Haldol if absolutely necessary.   #2 urinary tract infection: afebrile and non-toxic appearing.  Urine cultures pending. Continue ceftriaxone day #2.   #3 intractable back pain in setting of recent spinal fusion surgery: Neurologically, she  Is intact in the lower extremities. Patient had MRI of the thoracic and lumbar spine which revealed fluid collections at the operative site. The images were reviewed by her surgeon, Dr. Patrice Paradise. He opined that this is to be expected considering her recent surgery. Her WBC is normal. She remains afebrile. Incision site mild erythema, but no drainage. LE extremity with bilateral 5/5, sensation intact.  She has refused  physical and occupational therapy due to pain. Have discontinued dilaudid secondary to #1. Will manage pain with robaxin and oxy IR.  #4 normocytic anemia: stable Continue to monitor hemoglobin. No evidence of active bleeding.    Code Status: full Family Communication: husband at discharge Disposition Plan: home hopefully tomorrow   Consultants:  none  Procedures:  none  Antibiotics:  Rocephin 04/28/14>>  HPI/Subjective: Crying in bed complaining of back pain.    Objective: Filed Vitals:   04/29/14 1340  BP: 157/84  Pulse: 98  Temp: 98.9 F (37.2 C)  Resp: 20    Intake/Output Summary (Last 24 hours) at 04/29/14 1458 Last data filed at 04/29/14 1200  Gross per 24 hour  Intake    120 ml  Output   2470 ml  Net  -2350 ml   Filed Weights   04/29/14 0016  Weight: 72.938 kg (160 lb 12.8 oz)    Exam:   General:  Well nourished appears slightly uncomfortable  Cardiovascular: RRR no MGR No LE edema  Respiratory: normal effort BS slightly distant but clear. No wheeze  Abdomen: obese soft +BS non-tender to palpation  Musculoskeletal: LE strength 5/5. Sensation bilateral LE intact. Other joints without erythema/swelling   Data Reviewed: Basic Metabolic Panel:  Recent Labs Lab 04/28/14 1802 04/29/14 0445  NA 142 142  K 3.8 3.7  CL 102 101  CO2 30 29  GLUCOSE 93 112*  BUN 16 11  CREATININE 0.72 0.65  CALCIUM 8.5 8.4   Liver Function Tests:  Recent Labs Lab 04/28/14 1802 04/29/14 0445  AST 30 34  ALT 21 21  ALKPHOS 59 64  BILITOT 0.2* 0.3  PROT 5.8* 6.2  ALBUMIN 2.6* 2.7*   No results found for this basename: LIPASE, AMYLASE,  in the last 168 hours No results found for this basename: AMMONIA,  in the last 168 hours CBC:  Recent Labs Lab 04/28/14 1802 04/29/14 0445  WBC 6.1 6.8  NEUTROABS 3.5  --   HGB 10.0* 10.2*  HCT 29.2* 30.9*  MCV 97.0 96.9  PLT 338 350   Cardiac Enzymes:  Recent Labs Lab 04/28/14 1802  TROPONINI <  0.30   BNP (last 3 results) No results found for this basename: PROBNP,  in the last 8760 hours CBG: No results found for this basename: GLUCAP,  in the last 168 hours  No results found for this or any previous visit (from the past 240 hour(s)).   Studies: Dg Chest 1 View  04/28/2014   CLINICAL DATA:  Pre MRI screening, altered mental status, patient puncture she has a pacemaker  EXAM: CHEST - 1 VIEW  COMPARISON:  Thoracic spine radiographs- 04/23/2014  FINDINGS: Grossly  unchanged cardiac silhouette and mediastinal contours given persistently reduced lung volumes. Minimal bibasilar heterogeneous opacities, left greater than right. Mild pulmonary venous congestion without frank edema no pleural effusion or pneumothorax. Grossly unchanged bones including sequela of long segment lower thoracic / upper lumbar paraspinal fusion. Regional soft tissues appear normal.  IMPRESSION: 1. No evidence of cardiac pacemaker. 2. Bibasilar atelectasis without definite acute cardiopulmonary disease on this hypoventilated AP supine examination. 3. Post long segment lower thoracic/upper lumbar paraspinal fusion, incompletely evaluated   Electronically Signed   By: Sandi Mariscal M.D.   On: 04/28/2014 18:40   Mr Thoracic Spine W Wo Contrast  04/28/2014   CLINICAL DATA:  Thoracic and lumbar spine surgery on 06/15. Worsening pain. Altered mental status.  EXAM: MRI THORACIC AND LUMBAR SPINE WITHOUT AND WITH CONTRAST  TECHNIQUE: Multiplanar and multiecho pulse sequences of the thoracic and lumbar spine were obtained without and with intravenous contrast.  CONTRAST:  62mL MULTIHANCE GADOBENATE DIMEGLUMINE 529 MG/ML IV SOLN  COMPARISON:  Lumbar spine MRI 02/27/2014  FINDINGS: Images are mildly two moderately degraded by motion artifact.  Thoracic vertebral alignment is normal. Thoracic vertebral body heights are preserved. There is mild to moderate disc space narrowing with associated degenerative marrow changes from T4-T9. The thoracic spinal cord is normal in caliber and signal although is partially obscured by susceptibility artifact in the lower thoracic spine. A small amount of patchy signal abnormality is noted in the dependent right lung, incompletely evaluated but may reflect subsegmental atelectasis.  Patient's prior posterior lumbar fusion has been extended proximally since the prior MRI, with bilateral pedicle screws now present from T10-L3. Superficial gas and fluid collection along the surgical  incision measures approximately 12 cm in craniocaudal length and has greatest axial measurements of 2.4 x 2.6 cm at the T11 level. There is mild surrounding enhancement. Fluid collection at the L1-2 laminectomy site measures 6.7 x 2.4 cm (transverse by AP). Small dorsal epidural fluid collection/hematoma at the L1-2 level measures 15 x 6 mm (series 11, image 26). This fluid collection and a circumferential L1-2 disc bulge result in moderate spinal stenosis. A small amount of dorsal epidural fluid/hematoma at L2-3 measures 10 x 5 mm without stenosis.  Evaluation of the spinal canal in the lower thoracic spine is limited by susceptibility artifact from fixation hardware. The right T12 pedicle screw appears to traverse the right lateral recess. The conus medullaris terminates at L1.  Grade 1 anterolisthesis of L3 on L4 and L4 on L5 is unchanged. Sequelae of prior laminectomies are again identified from L2-3 to L4-5. Mild lateral recess narrowing at L4-5 is unchanged. L5-S1 interbody fusion is noted. There is mild bilateral neural foraminal narrowing at L5-S1, right greater than left.  IMPRESSION: 1. Interval thoracolumbar posterior fusion. 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 2. Fluid collections at the L1-2 laminectomy site and superficially along the surgical incision, which most  likely represent postoperative hematoma/seromas, although superimposed infection cannot be excluded. 3. The right T12 pedicle screw appears to traverse the right lateral recess.   Electronically Signed   By: Logan Bores   On: 04/28/2014 20:19   Mr Lumbar Spine W Wo Contrast  04/28/2014   CLINICAL DATA:  Thoracic and lumbar spine surgery on 06/15. Worsening pain. Altered mental status.  EXAM: MRI THORACIC AND LUMBAR SPINE WITHOUT AND WITH CONTRAST  TECHNIQUE: Multiplanar and multiecho pulse sequences of the thoracic and lumbar spine were obtained  without and with intravenous contrast.  CONTRAST:  42mL MULTIHANCE GADOBENATE DIMEGLUMINE 529 MG/ML IV SOLN  COMPARISON:  Lumbar spine MRI 02/27/2014  FINDINGS: Images are mildly two moderately degraded by motion artifact.  Thoracic vertebral alignment is normal. Thoracic vertebral body heights are preserved. There is mild to moderate disc space narrowing with associated degenerative marrow changes from T4-T9. The thoracic spinal cord is normal in caliber and signal although is partially obscured by susceptibility artifact in the lower thoracic spine. A small amount of patchy signal abnormality is noted in the dependent right lung, incompletely evaluated but may reflect subsegmental atelectasis.  Patient's prior posterior lumbar fusion has been extended proximally since the prior MRI, with bilateral pedicle screws now present from T10-L3. Superficial gas and fluid collection along the surgical incision measures approximately 12 cm in craniocaudal length and has greatest axial measurements of 2.4 x 2.6 cm at the T11 level. There is mild surrounding enhancement. Fluid collection at the L1-2 laminectomy site measures 6.7 x 2.4 cm (transverse by AP). Small dorsal epidural fluid collection/hematoma at the L1-2 level measures 15 x 6 mm (series 11, image 26). This fluid collection and a circumferential L1-2 disc bulge result in moderate spinal stenosis. A small amount of dorsal epidural fluid/hematoma at L2-3 measures 10 x 5 mm without stenosis.  Evaluation of the spinal canal in the lower thoracic spine is limited by susceptibility artifact from fixation hardware. The right T12 pedicle screw appears to traverse the right lateral recess. The conus medullaris terminates at L1.  Grade 1 anterolisthesis of L3 on L4 and L4 on L5 is unchanged. Sequelae of prior laminectomies are again identified from L2-3 to L4-5. Mild lateral recess narrowing at L4-5 is unchanged. L5-S1 interbody fusion is noted. There is mild bilateral neural  foraminal narrowing at L5-S1, right greater than left.  IMPRESSION: 1. Interval thoracolumbar posterior fusion. 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 2. Fluid collections at the L1-2 laminectomy site and superficially along the surgical incision, which most likely represent postoperative hematoma/seromas, although superimposed infection cannot be excluded. 3. The right T12 pedicle screw appears to traverse the right lateral recess.   Electronically Signed   By: Logan Bores   On: 04/28/2014 20:19    Scheduled Meds: . cefTRIAXone (ROCEPHIN)  IV  1 g Intravenous Q24H  . docusate sodium  100 mg Oral BID  . sodium chloride  3 mL Intravenous Q12H   Continuous Infusions:   Principal Problem:   Acute encephalopathy Active Problems:   Depression   Obesity (BMI 30-39.9)   S/P lumbar fusion   UTI (lower urinary tract infection)   Intractable back pain    Time spent: 35 minutes    [facility] Pager [phone]. If 7PM-7AM, please contact night-coverage at [email or web address], password [facility] 04/29/2014, 2:58 PM  LOS: 1 day

## 2014-04-29 NOTE — Progress Notes (Signed)
OT Cancellation Note  Patient Details Name: Brittany Mays MRN: [id number] DOB: Jun 03, 1941   Cancelled Treatment:    Reason Eval/Treat Not Completed: Pain limiting ability to participate.  Pt unable to participate at this time, due to increased pain, confusion, and fear.  Attempted to soothe pt, but pt had increased fear of 'therapist.'  Will re-attempt OT eval at later time/date.  Bea Graff, [place], OTR/L [phone]  04/29/2014, 8:55 AM

## 2014-04-30 ENCOUNTER — Ambulatory Visit: Payer: Medicare Other | Admitting: Internal Medicine

## 2014-04-30 DIAGNOSIS — R262 Difficulty in walking, not elsewhere classified: Secondary | ICD-10-CM

## 2014-04-30 LAB — URINE CULTURE: Colony Count: >=100000

## 2014-04-30 MED ORDER — HYDROMORPHONE HCL PF 1 MG/ML IJ SOLN
0.5000 mg | Freq: Once | INTRAMUSCULAR | Status: AC
  Administered 2014-04-30: 0.5 mg via INTRAVENOUS
  Filled 2014-04-30: qty 1

## 2014-04-30 MED ORDER — CIPROFLOXACIN HCL 500 MG PO TABS: 500.0000 mg | ORAL_TABLET | Freq: Two times a day (BID) | ORAL | Status: DC

## 2014-04-30 MED ORDER — TRAZODONE HCL 50 MG PO TABS
50.0000 mg | ORAL_TABLET | Freq: Every evening | ORAL | Status: DC | PRN
  Administered 2014-04-30: 50 mg via ORAL
  Filled 2014-04-30: qty 1

## 2014-04-30 MED ORDER — OXYCODONE-ACETAMINOPHEN 5-325 MG PO TABS: 1.0000 | ORAL_TABLET | ORAL | Status: DC | PRN

## 2014-04-30 MED ORDER — ALPRAZOLAM 0.5 MG PO TABS: 0.5000 mg | ORAL_TABLET | Freq: Two times a day (BID) | ORAL | Status: DC | PRN

## 2014-04-30 MED ORDER — METHOCARBAMOL 500 MG PO TABS: 500.0000 mg | ORAL_TABLET | Freq: Three times a day (TID) | ORAL | Status: DC | PRN

## 2014-04-30 NOTE — Evaluation (Signed)
Occupational Therapy Evaluation Patient Details Name: Brittany Mays MRN: [id number] DOB: 01/07/1941 Today's Date: 04/30/2014    History of Present Illness Brittany Mays is a 73 y.o. 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 Patrice Paradise in [facility]. She was released from the hospital on June 17. She has been doing okay at home. She has been ambulating with a walker. Over the last few days the patient's family has noted worsening confusion. This has progressively gotten worse and today they decided to bring her into the hospital. There's been no fever. No chills. Patient is very confused and agitated at times. Crying at times and unable to provide much history. Her son is at the bedside, but not much information was available from him either. According to the patient's husband who spoke to the ED physician patient was started on multiple new medications since discharge, including tramadol, flexible, Xanax, baclofen, and gabapentin. History is extremely limited.   Clinical Impression   Pt is presenting to acute OT with the above situation.  She remains with increased anxiety and pain, but decreased from yesterday.  Pain limits her current abilities to engage in ADL tasks - she is grossly at a mod-max assist level for lower body tasks.  Pt will benefit from continued OT services.  Recommend SNF OT at this time.    Follow Up Recommendations  SNF    Equipment Recommendations  Other (comment) (Defer to SNF)    Recommendations for Other Services       Precautions / Restrictions Precautions Precautions: Back;Fall Precaution Comments: Pt able to verbalize back precautions 3/3 - no bending, lifting, twisting Restrictions Weight Bearing Restrictions: No      Mobility Bed Mobility Overal bed mobility: Needs Assistance Bed Mobility: Supine to Sit;Sit to Sidelying     Supine to sit: Mod assist   Sit to sidelying: Min assist    Transfers                      Balance Overall balance assessment: Needs assistance Sitting-balance support: Single extremity supported;Feet supported Sitting balance-Leahy Scale: Fair                                      ADL Overall ADL's : Needs assistance/impaired Eating/Feeding: Set up;Bed level   Grooming: Set up;Bed level                 Lower Body Dressing Details (indicate cue type and reason): Did not assess due to pain/anxiety                     Vision                     Perception     Praxis      Pertinent Vitals/Pain Pt in 6/10 pain     Hand Dominance Right   Extremity/Trunk Assessment Upper Extremity Assessment Upper Extremity Assessment: Generalized weakness           Communication Communication Communication: No difficulties   Cognition Arousal/Alertness: Awake/alert Behavior During Therapy: Anxious;WFL for tasks assessed/performed Overall Cognitive Status: Within Functional Limits for tasks assessed                     General Comments  Exercises       Shoulder Instructions      Home Living Family/patient expects to be discharged to:: Skilled nursing facility Living Arrangements: Spouse/significant other                                      Prior Functioning/Environment Level of Independence: Independent with assistive device(s)        Comments: Used a rolling walker since surgery for amb, though has not attempted amb secondary to pain in the past couple days. pt verbalizes being independent with all ADL needs prior to surgery, and verbalized 'thats when i started going loopy' when asked about independence between surgery and current hospitilization.    OT Diagnosis: Generalized weakness;Acute pain   OT Problem List: Decreased strength;Decreased range of motion;Decreased activity tolerance;Impaired balance (sitting  and/or standing);Pain   OT Treatment/Interventions: Self-care/ADL training;Therapeutic exercise;Energy conservation;DME and/or AE instruction;Therapeutic activities;Balance training;Patient/family education    OT Goals(Current goals can be found in the care plan section) Acute Rehab OT Goals Patient Stated Goal: to decrease the pain OT Goal Formulation: With patient Time For Goal Achievement: 05/14/14 Potential to Achieve Goals: Good ADL Goals Pt Will Perform Upper Body Dressing: with supervision Pt Will Perform Lower Body Dressing: with min assist Pt Will Transfer to Toilet: with min assist  OT Frequency: Min 2X/week   Barriers to D/C:            Co-evaluation              End of Session    Activity Tolerance: Patient tolerated treatment well;Patient limited by pain;Other (comment) (Limited by anxiety) Patient left: with family/visitor present;in bed;with call bell/phone within reach;with bed alarm set   Time: 3335-4562 OT Time Calculation (min): 22 min Charges:  OT General Charges $OT Visit: 1 Procedure OT Evaluation $Initial OT Evaluation Tier I: 1 Procedure G-Codes:     Bea Graff, MS, OTR/L [phone]  04/30/2014, 12:24 PM

## 2014-04-30 NOTE — Discharge Summary (Addendum)
Physician Discharge Summary  TIMMI DEVORA ZOX:[id number] DOB: 1941-02-14 DOA: 04/28/2014  PCP: Hollace Kinnier, DO Back surgeon: Dr. Patrice Paradise [place] date: 04/28/2014 Discharge date: 04/30/2014  Recommendations for Outpatient Follow-up:  1. Polypharmacy, see discussion below 2. Resolution of Klebsiella UTI 3. Recent back surgery 6/15 4. Depression and anxiety   Follow-up Information   Follow up with REED, TIFFANY, DO In 2 weeks.   Specialty:  Geriatric Medicine   Contact information:   [address] [place] [address] [phone]       Follow up with Starling Manns, MD On 05/19/2014. (10:40 AM)    Specialty:  Orthopedic Surgery   Contact information:   [address] [place] [address] [phone]      Discharge Diagnoses:  1. Acute encephalopathy 2. UTI 3. Difficulty ambulating 4. Urinary retention 5. Status post back surgery 04/20/2014 6. Chronic pain 7. Polypharmacy 8. Depression, anxiety  Discharge Condition: Improved Disposition: SNF  Diet recommendation: regular   Filed Weights   04/29/14 0016  Weight: 72.938 kg (160 lb 12.8 oz)    History of present illness:  73 year old woman underwent spinal fusion surgery June 15 at [facility], released from the hospital June 17. Shortly thereafter she developed confusion without fever; crying spells. Apparently was recently started on new medications including tramadol, Flexeril, Xanax, baclofen and gabapentin.  Hospital Course:  Patient was omitted for further evaluation of encephalopathy. This rapidly resolved with withholding of multiple medications and treatment of UTI. MRI of her back was discussed with her surgeon who felt these were normal changes. She has no neurologic dysfunction and has clinically improved. Plan is for transfer to skilled nursing facility for short-term rehabilitation.  Outpatient med list:  Flexeril 10mg  BID  Tramadol 50mg  TID PRN  Effexor XR cap 150mg   daily  Baclofen 10mg  Q8  Xanax 0.5mg  TID PRN  Oxycodone/APAP 10-325mg  1 tab q4-6 hr PRN  Neurontin 300mg  1-2 tab TID    1. Acute encephalopathy, resolved. Suspected to be multifactorial, polypharmacy, UTI. 2. Klebsiella UTI. Continue oral ciprofloxacin.  3. Difficulty ambulating, appears resolved. Doing much better with physical therapy. Likely functional in nature complicated by UTI and polypharmacy.  4. Urinary retention, resolved, likely related to polypharmacy and recent surgery. 5. Status post back surgery 04/20/2014 and [facility], discharge 6/19 2015. MRI showed fluid collection which was felt to be normal postoperative changes from surgery for her orthopedic surgeon.  6. Chronic pain seems very well controlled at this point. 7. Depression, anxiety  She has no focal neurologic symptoms or signs to suggest postsurgical complication. Her presentation is most consistent with polypharmacy, possible prescription drug abuse and UTI. I discussed the case with her surgeon who has reviewed the MRI findings and feels these are normal postsurgical changes and advises to keep her followup appointment July 14 at 10:40 AM. No lifting more than 5 pounds. Wear back brace when up for prolonged periods of time. Encouraged to ambulate.  In discussion with her daughter-in-law, the patient has a long history of anxiety and attention seeking as well as polypharmacy. She utilizes multiple pharmacies and multiple doctors.  Consultants:  Dr. Patrice Paradise by telephone Procedures:  none Antibiotics:  Ceftriaxone 6/23  Cipro 6/24 >>  Discharge Instructions  Discharge Instructions   Diet general    Complete by:  As directed      Discharge instructions    Complete by:  As directed   Call physician or seek immediate medical assistance for  confusion, weakness, numbness or worsening of condition. Activity level per orthopedic surgeon Dr. Towanda Malkin office. No lifting more than 5 pounds, wear back brace when up  for a long time. Walk as tolerated with assistance, walker. No twisting.            Medication List    STOP taking these medications       AMBULATORY NON FORMULARY MEDICATION     baclofen 10 MG tablet  Commonly known as:  LIORESAL     oxyCODONE-acetaminophen 10-325 MG per tablet  Commonly known as:  PERCOCET  Replaced by:  oxyCODONE-acetaminophen 5-325 MG per tablet     traMADol 50 MG tablet  Commonly known as:  ULTRAM      TAKE these medications       ALPRAZolam 0.5 MG tablet  Commonly known as:  XANAX  Take 1 tablet (0.5 mg total) by mouth 2 (two) times daily as needed for anxiety.     calcium-vitamin D 500-200 MG-UNIT per tablet  Commonly known as:  OSCAL WITH D  Take 1 tablet by mouth daily with breakfast.     ciprofloxacin 500 MG tablet  Commonly known as:  CIPRO  Take 1 tablet (500 mg total) by mouth 2 (two) times daily. Last dose 6/28.     fish oil-omega-3 fatty acids 1000 MG capsule  Take 1 g by mouth daily.     gabapentin 300 MG capsule  Commonly known as:  NEURONTIN  Take 1-2 capsules by mouth 3 (three) times daily.     methocarbamol 500 MG tablet  Commonly known as:  ROBAXIN  Take 1 tablet (500 mg total) by mouth every 8 (eight) hours as needed for muscle spasms.     oxyCODONE-acetaminophen 5-325 MG per tablet  Commonly known as:  ROXICET  Take 1 tablet by mouth every 4 (four) hours as needed for moderate pain.     Red Yeast Rice 600 MG Tabs  Take 600 mg by mouth 2 (two) times daily.     vitamin B-12 100 MCG tablet  Commonly known as:  CYANOCOBALAMIN  Take 100 mcg by mouth daily.     WOMENS MULTI Caps  Take one tablet once daily       Allergies  Allergen Reactions  . Nsaids Diarrhea and Other (See Comments)    "horrible cramps".  . Sodium Pentobarbital [Pentobarbital] Other (See Comments)    Used as a child in an operation.  MDs told her parents never to let her take this again.  . Codeine Hives and Other (See Comments)    Cramping     . Fentanyl Itching and Other (See Comments)    sweating duragesic patch only; can tolerate IV    The results of significant diagnostics from this hospitalization (including imaging, microbiology, ancillary and laboratory) are listed below for reference.    Significant Diagnostic Studies: Dg Chest 1 View  04/28/2014   CLINICAL DATA:  Pre MRI screening, altered mental status, patient puncture she has a pacemaker  EXAM: CHEST - 1 VIEW  COMPARISON:  Thoracic spine radiographs- 04/23/2014  FINDINGS: Grossly unchanged cardiac silhouette and mediastinal contours given persistently reduced lung volumes. Minimal bibasilar heterogeneous opacities, left greater than right. Mild pulmonary venous congestion without frank edema no pleural effusion or pneumothorax. Grossly unchanged bones including sequela of long segment lower thoracic / upper lumbar paraspinal fusion. Regional soft tissues appear normal.  IMPRESSION: 1. No evidence of cardiac pacemaker. 2. Bibasilar atelectasis without definite acute cardiopulmonary disease on this  hypoventilated AP supine examination. 3. Post long segment lower thoracic/upper lumbar paraspinal fusion, incompletely evaluated   Electronically Signed   By: Sandi Mariscal M.D.   On: 04/28/2014 18:40   Mr Thoracic Spine W Wo Contrast  04/28/2014   CLINICAL DATA:  Thoracic and lumbar spine surgery on 06/15. Worsening pain. Altered mental status.  EXAM: MRI THORACIC AND LUMBAR SPINE WITHOUT AND WITH CONTRAST  TECHNIQUE: Multiplanar and multiecho pulse sequences of the thoracic and lumbar spine were obtained without and with intravenous contrast.  CONTRAST:  68mL MULTIHANCE GADOBENATE DIMEGLUMINE 529 MG/ML IV SOLN  COMPARISON:  Lumbar spine MRI 02/27/2014  FINDINGS: Images are mildly two moderately degraded by motion artifact.  Thoracic vertebral alignment is normal. Thoracic vertebral body heights are preserved. There is mild to moderate disc space narrowing with associated degenerative  marrow changes from T4-T9. The thoracic spinal cord is normal in caliber and signal although is partially obscured by susceptibility artifact in the lower thoracic spine. A small amount of patchy signal abnormality is noted in the dependent right lung, incompletely evaluated but may reflect subsegmental atelectasis.  Patient's prior posterior lumbar fusion has been extended proximally since the prior MRI, with bilateral pedicle screws now present from T10-L3. Superficial gas and fluid collection along the surgical incision measures approximately 12 cm in craniocaudal length and has greatest axial measurements of 2.4 x 2.6 cm at the T11 level. There is mild surrounding enhancement. Fluid collection at the L1-2 laminectomy site measures 6.7 x 2.4 cm (transverse by AP). Small dorsal epidural fluid collection/hematoma at the L1-2 level measures 15 x 6 mm (series 11, image 26). This fluid collection and a circumferential L1-2 disc bulge result in moderate spinal stenosis. A small amount of dorsal epidural fluid/hematoma at L2-3 measures 10 x 5 mm without stenosis.  Evaluation of the spinal canal in the lower thoracic spine is limited by susceptibility artifact from fixation hardware. The right T12 pedicle screw appears to traverse the right lateral recess. The conus medullaris terminates at L1.  Grade 1 anterolisthesis of L3 on L4 and L4 on L5 is unchanged. Sequelae of prior laminectomies are again identified from L2-3 to L4-5. Mild lateral recess narrowing at L4-5 is unchanged. L5-S1 interbody fusion is noted. There is mild bilateral neural foraminal narrowing at L5-S1, right greater than left.  IMPRESSION: 1. Interval thoracolumbar posterior fusion. 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 2. Fluid collections at the L1-2 laminectomy site and superficially along the surgical incision, which most likely represent postoperative  hematoma/seromas, although superimposed infection cannot be excluded. 3. The right T12 pedicle screw appears to traverse the right lateral recess.   Electronically Signed   By: Logan Bores   On: 04/28/2014 20:19   Microbiology: Recent Results (from the past 240 hour(s))  URINE CULTURE     Status: None   Collection Time    04/28/14  5:20 PM      Result Value Ref Range Status   Specimen Description URINE, CATHETERIZED   Final   Special Requests NONE   Final   Culture  Setup Time     Final   Value: 04/29/2014 00:22     Performed at [facility] Count     Final   Value: >=100,000 COLONIES/ML     Performed at [facility]   Culture     Final   Value: [place]  Performed at [facility]   Report Status PENDING   Incomplete     Labs: Basic Metabolic Panel:  Recent Labs Lab 04/28/14 1802 04/29/14 0445  NA 142 142  K 3.8 3.7  CL 102 101  CO2 30 29  GLUCOSE 93 112*  BUN 16 11  CREATININE 0.72 0.65  CALCIUM 8.5 8.4   Liver Function Tests:  Recent Labs Lab 04/28/14 1802 04/29/14 0445  AST 30 34  ALT 21 21  ALKPHOS 59 64  BILITOT 0.2* 0.3  PROT 5.8* 6.2  ALBUMIN 2.6* 2.7*   CBC:  Recent Labs Lab 04/28/14 1802 04/29/14 0445  WBC 6.1 6.8  NEUTROABS 3.5  --   HGB 10.0* 10.2*  HCT 29.2* 30.9*  MCV 97.0 96.9  PLT 338 350   Cardiac Enzymes:  Recent Labs Lab 04/28/14 1802  TROPONINI <0.30    Principal Problem:   Acute encephalopathy Active Problems:   Depression   Obesity (BMI 30-39.9)   S/P lumbar fusion   UTI (lower urinary tract infection)   Intractable back pain   Time coordinating discharge: 35 minutes.  Signed:  Murray Hodgkins, MD [facility] 04/30/2014, 2:02 PM

## 2014-04-30 NOTE — Clinical Social Work Note (Signed)
PASARR # rec'd and SNF bed accepted at [facility] for tomorrow- MD aware and will have CSW covering tomorrow to assist with transfer-  Patient, husband and daughter in law are agreeable to these plans.  Eduard Clos, MSW, [place]

## 2014-04-30 NOTE — Clinical Social Work Note (Signed)
SNF bed offered at [facility] which is their preference. MD advised- patient very anxious/tearful upon my visit- stating, "I gotta get out of here- can't stay another night here".  CSW offered support and attempted to help her relax/calm down. Per husband, she has a lot of anxiety and takes medicine for this.  CSW will follow for dc to [facility] SNF.  Eduard Clos, MSW, [place]

## 2014-04-30 NOTE — Care Management Utilization Note (Signed)
UR completed 

## 2014-04-30 NOTE — Progress Notes (Signed)
PROGRESS NOTE  Brittany PITZ [id number]:[id number] DOB: 03-Oct-1941 DOA: 04/28/2014 PCP: Hollace Kinnier, DO  Summary: 73 year old woman underwent spinal fusion surgery June 15 at [facility], released from the hospital June 17. Shortly thereafter she developed confusion without fever; crying spells. Apparently was recently started on new medications including tramadol, Flexeril, Xanax, baclofen and gabapentin.  Assessment/Plan: 1. Acute encephalopathy, resolved. Suspected to be multifactorial, polypharmacy, UTI. 2. UTI. Continue oral ciprofloxacin. Followup culture. 3. Difficulty ambulating, appears resolved. Doing much better with physical therapy. Likely functional in nature complicated by UTI and polypharmacy.  4. Urinary retention, resolved, likely related to polypharmacy and recent surgery. 5. Status post back surgery 04/20/2014 and [facility], discharge 6/19 2015. MRI showed fluid collection which was felt to be normal postoperative changes from surgery for her orthopedic surgeon. Dr. Patrice Paradise [phone] 6. Chronic pain seems very well controlled at this point. 7. Depression, anxiety    Overall she feels much better today. She has no focal neurologic symptoms or signs to suggest postsurgical complication. Her presentation is most consistent with polypharmacy, possible prescription drug abuse and UTI. I discussed the case with her surgeon who has reviewed the MRI findings and feels these are normal postsurgical changes and advises to keep her followup appointment July 14 at 10:40 AM. No lifting more than 5 pounds. Wear back brace when up for prolonged periods of time. Encouraged to ambulate.  Continue Cipro, f/u culture  Stable for transfer to skilled nursing facility.  Outpatient med list: Flexeril 10mg  BUD Tramadol 50mg  TID PRN Effexor XR cap 150mg  daily Baclofen 10mg  Q8 Xanax 0.5mg  TID PRN Oxycodone/APAP 10-325mg  1 tab q4-6 hr PRN Neurontin 300mg  1-2 tab TID   Code  Status: full code DVT prophylaxis: SCDs Family Communication: discussed with husband at bedside Disposition Plan: SNF  Murray Hodgkins, MD  [facility]  Pager [phone] If 7PM-7AM, please contact night-coverage at [email or web address], password [email or web address] 04/30/2014, 1:44 PM  LOS: 2 days   Consultants:  Dr. Patrice Paradise by telephone  Procedures:  none  Antibiotics:  Ceftriaxone 6/23  Cipro 6/24 >>  HPI/Subjective: Feels much better today. No paresthesias. Excellent leg strength. No bowel or bladder dysfunction. No difficulty urinating. Mood much better.  Objective: Filed Vitals:   04/29/14 0558 04/29/14 1340 04/29/14 2311 04/30/14 0559  BP: 121/76 157/84 136/61 142/66  Pulse: 91 98 88 77  Temp: 98.1 F (36.7 C) 98.9 F (37.2 C) 98.5 F (36.9 C) 98.3 F (36.8 C)  TempSrc: Oral Oral Oral Oral  Resp: 20 20 20 20   Height:      Weight:      SpO2: 95% 95% 95% 95%    Intake/Output Summary (Last 24 hours) at 04/30/14 1344 Last data filed at 04/30/14 0941  Gross per 24 hour  Intake    360 ml  Output   2400 ml  Net  -2040 ml     Filed Weights   04/29/14 0016  Weight: 72.938 kg (160 lb 12.8 oz)    Exam:   Afebrile, vital signs stable. No hypoxia. Gen. Appears calm, comfortable. Psych. Calm today. Participate in exam and history. Cardiovascular. Regular rate and rhythm. No murmur, rub or gallop. No lower extremity edema. Respiratory. Clear to auscultation bilaterally. No wheezes, rales or rhonchi. Normal respiratory effort. Musculoskeletal. Excellent bilateral lower extremity strength, 5/5. Grossly normal sensation. Neurologic. Grossly nonfocal  Data Reviewed:   Urine culture greater than 100,000 gram-negative rods  Scheduled Meds: . ciprofloxacin  500 mg Oral BID  . docusate sodium  100 mg Oral BID  . sodium chloride  3 mL Intravenous Q12H   Continuous Infusions:   Principal Problem:   Acute encephalopathy Active Problems:   Depression   Obesity (BMI  30-39.9)   S/P lumbar fusion   UTI (lower urinary tract infection)   Intractable back pain

## 2014-05-01 ENCOUNTER — Inpatient Hospital Stay
Admission: RE | Admit: 2014-05-01 | Discharge: 2014-05-21 | Disposition: A | Discharge status: Home / Self Care | Payer: Medicare Other | Source: Ambulatory Visit | Attending: Internal Medicine | Admitting: Internal Medicine

## 2014-05-01 ENCOUNTER — Other Ambulatory Visit: Payer: Self-pay | Admitting: *Deleted

## 2014-05-01 DIAGNOSIS — M549 Dorsalgia, unspecified: Secondary | ICD-10-CM

## 2014-05-01 MED ORDER — OXYCODONE-ACETAMINOPHEN 5-325 MG PO TABS: 1.0000 | ORAL_TABLET | ORAL | Status: DC | PRN

## 2014-05-01 MED ORDER — ALPRAZOLAM 0.5 MG PO TABS: ORAL_TABLET | ORAL | Status: DC

## 2014-05-01 NOTE — Clinical Social Work Note (Signed)
Patient ready for discharge, will transfer to [facility] SNF w [facility] staff via tunnel.  Patient and husband informed and agreeable, facility agreeable to take patient today.  FL2 reviewed w RN and updated as needed.  Discharge summary and today's progress note faxed to facility via [facility].  Discharge packet prepared and placed w shadow chart for transport.  CSW signing off as no further SW needs identified.  Edwyna Shell, LCSW Clinical Social Worker [phone])

## 2014-05-01 NOTE — Plan of Care (Signed)
Problem: Discharge Progression Outcomes Goal: Discharge plan in place and appropriate Outcome: Completed/Met Date Met:  05/01/14 Pt transferred to [facility]  Report called to Ronda Lane Staff nurse  Goal: Activity appropriate for discharge plan Outcome: Completed/Met Date Met:  05/01/14 Progressive physical therapy     

## 2014-05-01 NOTE — Telephone Encounter (Signed)
[facility] 

## 2014-05-01 NOTE — Progress Notes (Signed)
  PROGRESS NOTE  Brittany Mays [id number]:[id number] DOB: 1941-09-28 DOA: 04/28/2014 PCP: Hollace Kinnier, DO  Summary: 73 year old woman underwent spinal fusion surgery June 15 at [facility], released from the hospital June 17. Shortly thereafter she developed confusion without fever; crying spells. Apparently was recently started on new medications including tramadol, Flexeril, Xanax, baclofen and gabapentin.  Assessment/Plan: 1. Acute encephalopathy, resolved. Suspected to be multifactorial, polypharmacy, UTI. 2. Klebsiella UTI. Continue oral ciprofloxacin.  3. Difficulty ambulating, appears resolved.Likely functional in nature complicated by UTI and polypharmacy.  4. Urinary retention, resolved, likely related to polypharmacy and recent surgery. 5. Status post back surgery 04/20/2014 and [facility], discharge 6/19 2015. MRI showed fluid collection which was felt to be normal postoperative changes from surgery per her orthopedic surgeon. D 6. Chronic pain seems very well controlled at this point. 7. Depression, anxiety    Remains stable for transfer to skilled nursing facility.   Murray Hodgkins, MD  [facility]  Pager [phone] If 7PM-7AM, please contact night-coverage at [email or web address], password [facility] 05/01/2014, 9:32 AM  LOS: 3 days   Consultants:  Dr. Patrice Paradise by telephone  Procedures:  none  Antibiotics:  Ceftriaxone 6/23  Cipro 6/24 >>  HPI/Subjective: No issues overnight. Eating okay. No numbness or tingling. No bowel or bladder dysfunction. Leg strength without change.  Objective: Filed Vitals:   04/30/14 0559 04/30/14 1625 04/30/14 2045 05/01/14 0550  BP: 142/66 138/72 122/62 131/58  Pulse: 77 78 95 93  Temp: 98.3 F (36.8 C) 98.1 F (36.7 C) 99.1 F (37.3 C) 98.4 F (36.9 C)  TempSrc: Oral Oral Oral Oral  Resp: 20 20 20 20   Height:      Weight:      SpO2: 95% 97% 92% 100%    Intake/Output Summary (Last 24 hours) at 05/01/14 0932 Last  data filed at 05/01/14 0921  Gross per 24 hour  Intake    720 ml  Output   1675 ml  Net   -955 ml     Filed Weights   04/29/14 0016  Weight: 72.938 kg (160 lb 12.8 oz)    Exam:  Afebrile, vital signs stable. No hypoxia. Gen. Appears calm and comfortable. Psych. Alert. Mentation grossly normal. Cardiovascular. Regular rate and rhythm. No murmur, rub or gallop. No lower extremity edema. Respiratory. Clear to auscultation bilaterally. No wheezes, rales or rhonchi. Normal respiratory effort. Musculoskeletal. Grossly normal tone and strength bilateral lower extremities, lifts each leg easily off the bed. Sensation grossly normal lower extremities. Neurologic. Grossly nonfocal.  Data Reviewed:   Urine culture Klebsiella pneumonia sensitive to Cipro.  Scheduled Meds: . ciprofloxacin  500 mg Oral BID  . docusate sodium  100 mg Oral BID  . sodium chloride  3 mL Intravenous Q12H   Continuous Infusions:   Principal Problem:   Acute encephalopathy Active Problems:   Depression   Obesity (BMI 30-39.9)   S/P lumbar fusion   UTI (lower urinary tract infection)   Intractable back pain

## 2014-05-04 ENCOUNTER — Non-Acute Institutional Stay (SKILLED_NURSING_FACILITY): Payer: Medicare Other | Admitting: Internal Medicine

## 2014-05-04 DIAGNOSIS — R404 Transient alteration of awareness: Secondary | ICD-10-CM

## 2014-05-04 DIAGNOSIS — M48061 Spinal stenosis, lumbar region without neurogenic claudication: Secondary | ICD-10-CM

## 2014-05-04 DIAGNOSIS — R339 Retention of urine, unspecified: Secondary | ICD-10-CM

## 2014-05-05 NOTE — Progress Notes (Addendum)
Patient ID: Brittany Mays, female   DOB: [birth year], 73 y.o.   MRN: [id number]                  HISTORY & PHYSICAL  DATE:  05/04/2014    FACILITY: [facility]    LEVEL OF CARE:   SNF   CHIEF COMPLAINT:  Admission to SNF, post stay at [facility].  Exact dates are not clear.    HISTORY OF PRESENT ILLNESS:  This is a 73 year-old woman who underwent an extensive spinal fusion surgery on June 15th at [facility].  She was released from hospital to home.   She lives here in [place].  She developed an acute postoperative delirium that included confusion, fever, crying spells.  She was on multiple new medications including Tramadol, Flexeril, Xanax, Baclofen, and gabapentin.    The patient was admitted to hospital for evaluation of acute delirium.  This rapidly resolved with treatment of a UTI and multiple medications.  An MRI of her back was reviewed with her surgeon, who felt these were normal post-op changes.    She was felt to have known neurologic dysfunction and was clinically improved.  She was sent here for short-term rehabilitation.    PAST MEDICAL HISTORY/PROBLEM LIST:  Includes:     Depression with anxiety.    Klebsiella UTI, treated with ciprofloxacin.    Extensive spinal fusion.  The details of this are not completely clear.  She had had previous lumbar spine surgery.    History of bilateral total knee replacements.    Urinary retention, which resolved.  Was felt secondary to polypharmacy and recent surgery.    CURRENT MEDICATIONS:  Discharge medications include:        Xanax 0.5 b.i.d. p.r.n.    Os-Cal with D, 500/200, 1 tablet daily.    Cipro 500 b.i.d., with the last dose on 05/03/2014.    Omega-3 fatty acids 1 g daily.    Gabapentin/Neurontin 300 mg, 1-2 capsules three times daily.    Robaxin 500 mg every 8 hours.    Roxicet 5/325 every 4 hours p.r.n. for moderate pain.    Vitamin B12, 100 mcg daily.     Multivitamin.    SOCIAL HISTORY:   HOUSING:  The patient tells me that she lives in [place] with her husband.  Has four stairs to get into the home.   FUNCTIONAL STATUS:  Was independent with ADLs and IADLs.    REVIEW OF SYSTEMS:   MUSCULOSKELETAL:  The patient has some back pain, but thinks this is improved.   NEUROLOGICAL:   She does not complain of altered sensory level or major weakness in her legs.  States she has mild weakness, although she has already been up walking.   GU:  She does not complain of dysuria or voiding difficulties.   GI:  No bowel issues.    PHYSICAL EXAMINATION:   GENERAL APPEARANCE:  This is an active, well orientated woman.  Absolutely no cognitive deficits are obvious.   CHEST/RESPIRATORY:  Clear air entry bilaterally.   CARDIOVASCULAR:  CARDIAC:   Heart sounds are normal.  There are no murmurs.  She appears to be euvolemic.   GASTROINTESTINAL:  LIVER/SPLEEN/KIDNEYS:  No liver, no spleen.  No tenderness.   GENITOURINARY:  BLADDER:   There is no evidence of bladder distention.   CIRCULATION:   EDEMA/VARICOSITIES:  Extremities:  No evidence of a DVT.   ARTERIAL:  Peripheral pulses are palpable.  NEUROLOGICAL:   She has no knee jerks bilaterally.  However, her strength is normal in hip flexion/abduction, ankle dorsiflexion, and extension of her first toes bilaterally.  Babinski response is equivocal.   PSYCHIATRIC:   MENTAL STATUS:   I see no obvious abnormalities here.    LABORATORY DATA:  Lab work currently shows:    White count 8, hemoglobin 11.2.  Differential count is normal.    Her basic metabolic panel is normal.    ASSESSMENT/PLAN:  Acute delirium.   Apparently resolved with treatment of a UTI and reduction of some of her medications.    A postoperative MRI was done, which showed a dorsal epidural fluid collection/hematoma, L1-L2, with moderate spinal stenosis.  Fluid collections at the L1-L2 laminectomy site and superficial long  incisions, which most likely represent postoperative hematomas/seromas.    Urinary retention.  There is no evidence of this clinically.    Polypharmacy?   I would agree that the Neurontin at this point seems redundant.  Again, she remains on a list of medications that can predispose to delirium including Xanax, Robaxin, as well as her narcotics, although right now everything appears to be very stable.    CPT CODE: 53976

## 2014-05-14 ENCOUNTER — Encounter: Payer: Self-pay | Admitting: Internal Medicine

## 2014-05-14 ENCOUNTER — Non-Acute Institutional Stay (SKILLED_NURSING_FACILITY): Payer: Medicare Other | Admitting: Internal Medicine

## 2014-05-14 DIAGNOSIS — Z981 Arthrodesis status: Secondary | ICD-10-CM

## 2014-05-14 DIAGNOSIS — B372 Candidiasis of skin and nail: Secondary | ICD-10-CM

## 2014-05-14 NOTE — Progress Notes (Signed)
Patient ID: Brittany Mays, female   DOB: Oct 27, 1941, 73 y.o.   MRN: [id number]   This is an acute visit.  Level of care skilled.  Facility [facility].  Chief complaint acute visit secondary to perineal itching.  History of present illness.  Patient is a pleasant 73 year old female is complaining of some itching in her perineal area recently she denies any dysuria or burning with urination or discharge.  Her vital signs are stable she is afebrile.  She is status post spinal fusion surgery but it developed postop delirium-this was thought possibly due to polypharmacy this has improved her stay here has been relatively unremarkable pain appears to be controlled on Percocet.  However she is complaining of some itching in her perineal area again is denying any associated dysuria or discharge.  Family medical social history as been reviewed per admission note on 05/05/2014.  Medications have been reviewed per MAR.  Review of systems.  General no complaints of fever or chills.  Respiratory does not complain of cough or heart breath.  Cardiac no chest pain complaints.  Muscle skeletal has extensive back issues as noted above but does not complaining of pain currently.  Skin again is complaining of itching in the perineal area.  Physical exam.  She is afebrile pulse of 84 respirations 18 blood pressures variable appears largely in the low 433I systolically 95J and 88C diastolically.  In general this is a pleasant elderly female in no distress ambulating with a walker.  Her skin is warm and dry.  I do note perineal area bilaterally there appears to be an erythematous rash--- there is no drainage or bleeding this is more in the folds.  Chest is clear to auscultation no labored breathing.   heart is regular rate and rhythm without murmur gallop or rub.  Muscle skeletal does have a back support on able to move all extremities x4 she is ambulating with  walker  Labs.  05/04/2014.  WBC 8.0 hemoglobin 11.2 platelets 329.  Sodium 140 potassium 3.7 BUN 18 creatinine 0.79.  Assessment and plan.  #1-rash suspect tendinitis will treat with Nizoral cream twice a day until resolved if no resolution notify provider.  #2-history of spinal fusion-at this point appears stable she is receiving Percocet apparently with relief--also continues on Neurontin which is being titrated down she is ambulating with a walker currently wearing a back support as well she is not complaining today of pain or discomfort.  Suspect she will be going home fairly shortly  [id number].

## 2014-05-16 ENCOUNTER — Non-Acute Institutional Stay (SKILLED_NURSING_FACILITY): Payer: Medicare Other | Admitting: Internal Medicine

## 2014-05-16 DIAGNOSIS — B372 Candidiasis of skin and nail: Secondary | ICD-10-CM

## 2014-05-16 NOTE — Progress Notes (Signed)
Patient ID: Brittany Mays, female   DOB: October 14, 1941, 73 y.o.   MRN: [id number]   This is an acute visit.  Level of care skilled.  Facility [facility].  Chief complaint-acute visit followup perineal rash.  History of present illness.  Patient is a pleasant elderly resident here for rehabilitation after undergoing a spinal fusion.  This appears to be doing relatively well she is wearing a back brace.   apparently she will be discharged here within a when I saw her earlier this week she had developed a beefy red perineal rash-she was started t on Nizoral cream but she says the rash is persisting. She says it still itches as well.  She does not complaining of any fever chills she is not listed as a diabetic.  Physical exam.  Perineal area there is a beefy perineal rash there appears to be some satellite lesions here this appears to be candidiasis-this does not appear remarkably changed since earlier this week there is no drainage or bleeding from the vaginal area-this is in the perineal area and thigh area bilaterally  Assessment and plan.  Dermatitis unspecified-this appears to be candidiasis as noted earlier-Will switch topical cream to Mycolog twice a day for 14 days-Will also start an oral agent Diflucan 100 mg daily x3 days and monitor-  [id number] .  Marland Kitchen

## 2014-05-20 ENCOUNTER — Non-Acute Institutional Stay (SKILLED_NURSING_FACILITY): Payer: Medicare Other | Admitting: Internal Medicine

## 2014-05-20 ENCOUNTER — Encounter: Payer: Self-pay | Admitting: Internal Medicine

## 2014-05-20 DIAGNOSIS — B372 Candidiasis of skin and nail: Secondary | ICD-10-CM

## 2014-05-20 DIAGNOSIS — Z981 Arthrodesis status: Secondary | ICD-10-CM

## 2014-05-20 DIAGNOSIS — G9340 Encephalopathy, unspecified: Secondary | ICD-10-CM

## 2014-05-20 NOTE — Progress Notes (Signed)
Patient ID: Brittany Mays, female   DOB: 1941-05-17, 73 y.o.   MRN: [id number]                                                                                                                         FACILITY:        [facility]     LEVEL OF CARE:   SNF  This is a discharge note       CHIEF COMPLAINT:  Discharge note       HISTORY OF PRESENT ILLNESS:  This is a 73 year-old woman who underwent an extensive spinal fusion surgery on June 15th at [facility].  She was released from hospital to home.    She lives here in [place].  She developed an acute postoperative delirium that included confusion, fever, crying spells.  She was on multiple new medications including Tramadol, Flexeril, Xanax, Baclofen, and gabapentin.     The patient was admitted to hospital for evaluation of acute delirium.  This rapidly resolved with treatment of a UTI and multiple medications.  An MRI of her back was reviewed with her surgeon, who felt these were normal post-op changes.     She was felt to have known neurologic dysfunction and was clinically improved.  She was sent here for short-term rehabilitation--and she appears to have done well here-she actually saw her spine specialist yesterday and thought to be doing well and suitable for discharge rash with orders to continue brace for activity-continue Percocet for pain as well as Robaxin when necessary.  She will need continued outpatient therapy as well.  She has no complaints today she recently was seen for a perineal rash which appears to be responding to topical treatment she did receive a short course of Diflucan as well.     PAST MEDICAL HISTORY/PROBLEM LIST:  Includes:     Depression with anxiety.    Klebsiella UTI, treated with ciprofloxacin.    Extensive spinal fusion.  The details of this are not completely clear.  She had had previous lumbar spine surgery.    History of bilateral total knee  replacements.    Urinary retention, which resolved.  Was felt secondary to polypharmacy and recent surgery.     CURRENT MEDICATIONS:  Discharge medications include:       Xanax 0.5 b.i.d. P.r.n--hardly ever uses this.    Os-Cal with D, 500/200, 1 tablet daily.    C.    Omega-3 fatty acids 1 g daily.   .    Robaxin 500 mg every 8 hoursprn.    Roxicet 5/325 every 4 hours p.r.n.  pain.    Vitamin B12, 100 mcg daily.    Multivitamin.     SOCIAL HISTORY:   HOUSING:  The patient tells me that she lives in [place] with her husband.  Has four stairs to get into the home.    FUNCTIONAL STATUS:  Was independent with ADLs and IADLs.  REVIEW OF SYSTEMS Gen. no complaints of fever or chills.  Eyes denies any visual changes.  Ears nose mouth and throat no complaints of sore throat or nasal discharge  Respiratory no complaints of shortness of breath or cough.  Cardiac no chest pain.  GI does not complaining of any abdominal discomfort nausea vomiting diarrhea constipation.  Muscle skeletal as back pain but this is relieved with the Percocet-says this is improving slowly.  Neurologic does not complain of any numbness tingling headache or dizziness.  Psych does have some history of depression but appears to be in good spirits.  Skin does have a perineal rash he says is resolving unremarkably itching is much improved  :     PHYSICAL EXAMINATION: Temperature 98.4 pulse 85 respirations 20 blood pressure 108/66 of these appear to be relatively baseline weight has been stable at 152    GENERAL APPEARANCE:  This is an active, well orientated woman.  Absolutely no cognitive deficits are obvious  Skin is warm and dry a perineal rash was not assessed the patient deferred as she states it is definitely improved  Eyes-pupils appear reactive to light sclerae and conjunctivae are clear.  Oropharynx is clear mucous membranes moist.     CHEST/RESPIRATORY:  Clear air entry bilaterally.      CARDIOVASCULAR:   CARDIAC:   Heart sounds are normal.  There are no murmurs.  She appears to be euvolemic--no significant lower extremity edema.    GASTROINTESTINAL:    Abdomen is soft nontender with active bowel sounds  CIRCULATION:    EDEMA/VARICOSITIES:  Extremities:  No evidence of a DVT.    ARTERIAL:  Peripheral pulses are palpable.    NEUROLOGICAL: Could not appreciate any lateralizing findings she does ambulate with a cane is doing well with this.  Her speech is clear cranial nerves are intact grossly no lateralizing findings.  Psych she is alert and oriented x3 pleasant and appropriate .     Marland Kitchen     LABORATORY DATA:  05/04/2014.  WBC 8.0 hemoglobin 11.2 platelets 329.  Sodium 140 potassium 3.7 BUN 18 creatinine 0.79.  In hospital liver function tests were within normal limits except albumin of 2.6 and total bilirubin of 0.2.     ASSESSMENT/PLAN: Acute delirium.   Apparently resolved with treatment of a UTI and reduction of some of her medications-this has not been an issue during her stay here.  History of lumbar fusion-again this appears to be stable she is followed by spine specialist and did see them yesterday as noted above pain appears to be controlled with Percocet  And Robaxin  --recommendation to continue with back brace-Percocet for pain as well as Robaxin.   A postoperative MRI was done, which showed a dorsal epidural fluid collection/hematoma, L1-L2, with moderate spinal stenosis.  Fluid collections at the L1-L2 laminectomy site and superficial long  incisions, which most likely represent postoperative hematomas/seromas.     Urinary retention.  There is no evidence of this clinically.     Polypharmacy? Medication adjustments were made in this appears to have resolved any confusion she is bright and alert conversant which has been her baseline when I had seen her during her stay   here.  Perineal rash-per patient this is resolving she deferred exam today  but says symptoms have improved significantly-she is receiving Mycolog cream-has finished a course of Diflucan  Patient will be going back home she lives with her husband-will need continued outpatient therapy  will use a cane for ambulation.  [id number] note greater than 30 minutes spent on this discharge summary     .

## 2014-05-21 ENCOUNTER — Telehealth (HOSPITAL_COMMUNITY): Payer: Self-pay

## 2014-05-26 ENCOUNTER — Telehealth: Payer: Self-pay | Admitting: *Deleted

## 2014-05-26 NOTE — Telephone Encounter (Signed)
Patient called and stated that she has had nausea since her surgery. Stated that nothing comes up. Patient has an appointment for 7/28 for Hospital Follow up. I offered patient a sooner appointment for just the nausea, but patient refused and stated that she would just wait for her appointment next week.

## 2014-06-02 ENCOUNTER — Encounter: Payer: Self-pay | Admitting: Internal Medicine

## 2014-06-02 ENCOUNTER — Ambulatory Visit (INDEPENDENT_AMBULATORY_CARE_PROVIDER_SITE_OTHER): Payer: Medicare Other | Admitting: Internal Medicine

## 2014-06-02 VITALS — BP 120/78 | HR 96 | Temp 98.4°F | Wt 150.0 lb

## 2014-06-02 DIAGNOSIS — F3289 Other specified depressive episodes: Secondary | ICD-10-CM

## 2014-06-02 DIAGNOSIS — D62 Acute posthemorrhagic anemia: Secondary | ICD-10-CM | POA: Insufficient documentation

## 2014-06-02 DIAGNOSIS — R5383 Other fatigue: Secondary | ICD-10-CM

## 2014-06-02 DIAGNOSIS — F411 Generalized anxiety disorder: Secondary | ICD-10-CM

## 2014-06-02 DIAGNOSIS — R21 Rash and other nonspecific skin eruption: Secondary | ICD-10-CM | POA: Insufficient documentation

## 2014-06-02 DIAGNOSIS — R531 Weakness: Secondary | ICD-10-CM | POA: Insufficient documentation

## 2014-06-02 DIAGNOSIS — M48061 Spinal stenosis, lumbar region without neurogenic claudication: Secondary | ICD-10-CM

## 2014-06-02 DIAGNOSIS — M5380 Other specified dorsopathies, site unspecified: Secondary | ICD-10-CM

## 2014-06-02 DIAGNOSIS — M62830 Muscle spasm of back: Secondary | ICD-10-CM | POA: Insufficient documentation

## 2014-06-02 DIAGNOSIS — F329 Major depressive disorder, single episode, unspecified: Secondary | ICD-10-CM

## 2014-06-02 DIAGNOSIS — R5381 Other malaise: Secondary | ICD-10-CM

## 2014-06-02 DIAGNOSIS — F32A Depression, unspecified: Secondary | ICD-10-CM

## 2014-06-02 MED ORDER — NYSTATIN 100000 UNIT/GM EX CREA: 1.0000 "application " | TOPICAL_CREAM | Freq: Two times a day (BID) | CUTANEOUS | Status: DC

## 2014-06-02 MED ORDER — VENLAFAXINE HCL ER 150 MG PO CP24: 150.0000 mg | ORAL_CAPSULE | Freq: Every day | ORAL | Status: DC

## 2014-06-02 NOTE — Progress Notes (Signed)
Patient ID: Brittany Mays, female   DOB: 1941/03/23, 73 y.o.   MRN: [id number]     PCP: Hollace Kinnier, DO  Allergies  Allergen Reactions  . Nsaids Diarrhea and Other (See Comments)    "horrible cramps".  . Sodium Pentobarbital [Pentobarbital] Other (See Comments)    Used as a child in an operation.  MDs told her parents never to let her take this again.  . Codeine Hives and Other (See Comments)    Cramping   . Fentanyl Itching and Other (See Comments)    sweating duragesic patch only; can tolerate IV    Chief Complaint: hospital and SNF follow up visit  HPI:  73 y/o female patient is seen today for hospital anfd SNF follow up visit. She was in the hospital from 04/28/14-04/30/14 with acute encephalopathy in setting of klebsiella uti and polypharmacy. Given her generalized weakness, she was sent to SNF for STR. She is s/p spinal fusion 04/20/14 and followed with her back surgeon while in SNF. She has back brace and prn pain med and muscle relaxant medications with her She is seeing Dr Patrice Paradise on 06/16/14 for her spinal fusion follow up She is using a cane to get around and has a back brace in place. Her back pain and muscle spasm under control with current regimen She complaints of itching in her groin area and has noted minimal itching in groin area Energy level has improved No falls reported Feels anxious at time as she feels limited in terms of activities that she can do with her back surgery at present Has lost 10 lbs since her last visit here and pt is happy about this She will start with outpatient therapy today  Review of Systems:  Constitutional: Negative for fever, chills HENT: Negative for congestion, hearing loss and sore throat.   Eyes: Negative for eye pain, blurred vision, double vision and discharge.  Respiratory: Negative for cough, sputum production, shortness of breath and wheezing.   Cardiovascular: Negative for chest pain, palpitations, orthopnea and leg swelling.    Gastrointestinal: Negative for heartburn, nausea, vomiting, abdominal pain. Appetite is getting better but not back to her normal. Has regular bowel movement. Denies melena or rectal bleed. No hx of hemorrhoids  Genitourinary: Negative for dysuria, urgency, frequency, hematuria and flank pain.  Musculoskeletal: Negative for back pain, falls, joint pain and myalgias.  Skin: Negative for itching and rash elsewhere Neurological: Negative for dizziness, tingling, focal weakness and headaches.  Psychiatric/Behavioral: Negative for depression. On effexor   Past Medical History  Diagnosis Date  . Depression   . Anxiety   . Insomnia   . Pain   . Cataract   . Osteoarthritis of both knees     s/p knee replacements  . Lumbar spinal stenosis     s/p laminectomy  . Hyperparathyroidism, primary     s/p parathyroidectomy  . Rectal prolapse     s/p repair   Past Surgical History  Procedure Laterality Date  . Abdominal hysterectomy    . Knee surgery Bilateral     Knee Replacements  . Spine surgery    . Parathyroidectomy    . Colonoscopy  2013   Social History:   reports that she quit smoking about 2 months ago. Her smoking use included Cigarettes. She has a 50 pack-year smoking history. She does not have any smokeless tobacco history on file. She reports that she does not drink alcohol or use illicit drugs.  Family History  Problem Relation Age  of Onset  . COPD Father   . Heart failure Father   . Diabetes Son     Medications: Patient's Medications  New Prescriptions   No medications on file  Previous Medications   ALPRAZOLAM (XANAX) 0.5 MG TABLET    Take one tablet by mouth twice daily as needed for anxiety   CALCIUM-VITAMIN D (OSCAL WITH D) 500-200 MG-UNIT PER TABLET    Take 2 tablets by mouth daily with breakfast.    FISH OIL-OMEGA-3 FATTY ACIDS 1000 MG CAPSULE    Take 1 g by mouth daily.    METHOCARBAMOL (ROBAXIN) 500 MG TABLET    Take 1 tablet (500 mg total) by mouth every 8  (eight) hours as needed for muscle spasms.   MULTIPLE VITAMINS-MINERALS (WOMENS MULTI) CAPS    Take one tablet once daily   OXYCODONE-ACETAMINOPHEN (ROXICET) 5-325 MG PER TABLET    Take 1 tablet by mouth every 4 (four) hours as needed for moderate pain.   RED YEAST RICE 600 MG TABS    Take 600 mg by mouth 2 (two) times daily.    VITAMIN B-12 (CYANOCOBALAMIN) 100 MCG TABLET    Take 100 mcg by mouth daily.   Modified Medications   No medications on file  Discontinued Medications   GABAPENTIN (NEURONTIN) 300 MG CAPSULE    Take 1-2 capsules by mouth 3 (three) times daily.     Physical Exam: Filed Vitals:   06/02/14 0938  BP: 120/78  Pulse: 96  Temp: 98.4 F (36.9 C)  TempSrc: Oral  Weight: 150 lb (68.04 kg)  SpO2: 97%   Wt Readings from Last 3 Encounters:  06/02/14 150 lb (68.04 kg)  04/29/14 160 lb 12.8 oz (72.938 kg)  04/02/14 161 lb (73.029 kg)   General- elderly female in no acute distress Head- atraumatic, normocephalic Eyes- no pallor, no icterus, no discharge Neck- no lymphadenopathy, no thyromegaly Throat- moist mucus membrane Cardiovascular- normal s1,s2, no murmurs Respiratory- bilateral clear to auscultation, no wheeze, no rhonchi, no crackles, no use of accessory muscles Abdomen- bowel sounds present, soft, non tender, no CVA tenderness Musculoskeletal- able to move all 4 extremities, no spinal and paraspinal tenderness, using a cane, no leg edema, brace in place Neurological- no focal deficit Skin- warm and dry, incision site healing well, some glue remaining, no erythema or signs of infection, has redness in both her groin area, no excorciation, no vaginal discharge Psychiatry- alert and oriented to person, place and time, normal mood and affect  Labs reviewed: Basic Metabolic Panel:  Recent Labs  12/09/13 0843 04/28/14 1802 04/29/14 0445  NA 145* 142 142  K 4.7 3.8 3.7  CL 104 102 101  CO2 27 30 29   GLUCOSE 98 93 112*  BUN 12 16 11   CREATININE 0.69  0.72 0.65  CALCIUM 9.3 8.5 8.4   Liver Function Tests:  Recent Labs  12/09/13 0843 04/28/14 1802 04/29/14 0445  AST 23 30 34  ALT 12 21 21   ALKPHOS 57 59 64  BILITOT 0.3 0.2* 0.3  PROT 6.3 5.8* 6.2  ALBUMIN  --  2.6* 2.7*   No results found for this basename: LIPASE, AMYLASE,  in the last 8760 hours No results found for this basename: AMMONIA,  in the last 8760 hours CBC:  Recent Labs  06/23/13 1137 12/09/13 0843 04/28/14 1802 04/29/14 0445  WBC 5.7 4.7 6.1 6.8  NEUTROABS 3.0 2.2 3.5  --   HGB 13.0 12.9 10.0* 10.2*  HCT 38.7 38.1 29.2* 30.9*  MCV 94 95 97.0 96.9  PLT  --   --  338 350   Cardiac Enzymes:  Recent Labs  04/28/14 1802  TROPONINI <0.30   Radiological Exams: Dg Chest 1 View  04/28/2014   CLINICAL DATA:  Pre MRI screening, altered mental status, patient puncture she has a pacemaker  EXAM: CHEST - 1 VIEW  COMPARISON:  Thoracic spine radiographs- 04/23/2014  FINDINGS: Grossly unchanged cardiac silhouette and mediastinal contours given persistently reduced lung volumes. Minimal bibasilar heterogeneous opacities, left greater than right. Mild pulmonary venous congestion without frank edema no pleural effusion or pneumothorax. Grossly unchanged bones including sequela of long segment lower thoracic / upper lumbar paraspinal fusion. Regional soft tissues appear normal.  IMPRESSION: 1. No evidence of cardiac pacemaker. 2. Bibasilar atelectasis without definite acute cardiopulmonary disease on this hypoventilated AP supine examination. 3. Post long segment lower thoracic/upper lumbar paraspinal fusion, incompletely evaluated   Electronically Signed   By: Sandi Mariscal M.D.   On: 04/28/2014 18:40   Mr Thoracic Spine W Wo Contrast  04/28/2014   CLINICAL DATA:  Thoracic and lumbar spine surgery on 06/15. Worsening pain. Altered mental status.  EXAM: MRI THORACIC AND LUMBAR SPINE WITHOUT AND WITH CONTRAST  TECHNIQUE: Multiplanar and multiecho pulse sequences of the thoracic  and lumbar spine were obtained without and with intravenous contrast.  CONTRAST:  66mL MULTIHANCE GADOBENATE DIMEGLUMINE 529 MG/ML IV SOLN COMPARISON:  Lumbar spine MRI 02/27/2014  FINDINGS: Images are mildly two moderately degraded by motion artifact.  Thoracic vertebral alignment is normal. Thoracic vertebral body heights are preserved. There is mild to moderate disc space narrowing with associated degenerative marrow changes from T4-T9. The thoracic spinal cord is normal in caliber and signal although is partially obscured by susceptibility artifact in the lower thoracic spine. A small amount of patchy signal abnormality is noted in the dependent right lung, incompletely evaluated but may reflect subsegmental atelectasis.  Patient's prior posterior lumbar fusion has been extended proximally since the prior MRI, with bilateral pedicle screws now present from T10-L3. Superficial gas and fluid collection along the surgical incision measures approximately 12 cm in craniocaudal length and has greatest axial measurements of 2.4 x 2.6 cm at the T11 level. There is mild surrounding enhancement. Fluid collection at the L1-2 laminectomy site measures 6.7 x 2.4 cm (transverse by AP). Small dorsal epidural fluid collection/hematoma at the L1-2 level measures 15 x 6 mm (series 11, image 26). This fluid collection and a circumferential L1-2 disc bulge result in moderate spinal stenosis. A small amount of dorsal epidural fluid/hematoma at L2-3 measures 10 x 5 mm without stenosis.  Evaluation of the spinal canal in the lower thoracic spine is limited by susceptibility artifact from fixation hardware. The right T12 pedicle screw appears to traverse the right lateral recess. The conus medullaris terminates at L1.  Grade 1 anterolisthesis of L3 on L4 and L4 on L5 is unchanged. Sequelae of prior laminectomies are again identified from L2-3 to L4-5. Mild lateral recess narrowing at L4-5 is unchanged. L5-S1 interbody fusion is noted.  There is mild bilateral neural foraminal narrowing at L5-S1, right greater than left.  IMPRESSION: 1. Interval thoracolumbar posterior fusion. 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 2. Fluid collections at the L1-2 laminectomy site and superficially along the surgical incision, which most likely represent postoperative hematoma/seromas, although superimposed infection cannot be excluded. 3. The right T12 pedicle screw appears to traverse the right  lateral recess.   Electronically Signed   By: Logan Bores   On: 04/28/2014 20:19    Assessment/Plan  1. Lumbar spinal stenosis S/p lumbar fusion. Continue roxicet 5-325 q4h prn for pain and robaxin 500 q8h prn muscle spasm, has follow up with [facility]. Continue back brace and use of cane. Fall precautions. Starting outpatient therapy today. Not to lift greater than 5 lbs for now, back precautions  2. Depression Stable, continue effexor for now  3. Muscle spasm of back Continue prn robaxin using it bid only for now, monitor clinically  4. Groin rash Will have her on nystatin cream for now and reassess if no improvement, check bmp  5. Generalized weakness Continue to work with therapy team to help strengthen her gait. Fall precautions  6. Acute blood loss anemia Low hb/hct on discharge from hospital, likely post bleed from surgery. Recheck h&h - CBC with Differential - CMP  7. Anxiety state, unspecified Continue prn xanax, has not required it recently   Labs/tests ordered- cbc, cmp    Blanchie Serve, MD  [facility] [phone] (Monday-Friday 8 am - 5 pm) [phone] (afterhours)

## 2014-06-03 LAB — COMPREHENSIVE METABOLIC PANEL
ALT: 11 IU/L (ref 0–32)
AST: 18 IU/L (ref 0–40)
Albumin/Globulin Ratio: 2 (ref 1.1–2.5)
Albumin: 4.3 g/dL (ref 3.5–4.8)
Alkaline Phosphatase: 79 IU/L (ref 39–117)
BUN / CREAT RATIO: 21 (ref 11–26)
BUN: 15 mg/dL (ref 8–27)
CALCIUM: 9.6 mg/dL (ref 8.7–10.3)
CHLORIDE: 99 mmol/L (ref 97–108)
CO2: 25 mmol/L (ref 18–29)
CREATININE: 0.71 mg/dL (ref 0.57–1.00)
GFR calc Af Amer: 96 mL/min/{1.73_m2} (ref >59)
GFR calc non Af Amer: 84 mL/min/{1.73_m2} (ref >59)
Globulin, Total: 2.1 g/dL (ref 1.5–4.5)
Glucose: 92 mg/dL (ref 65–99)
Potassium: 4.1 mmol/L (ref 3.5–5.2)
SODIUM: 139 mmol/L (ref 134–144)
Total Bilirubin: 0.2 mg/dL (ref 0.0–1.2)
Total Protein: 6.4 g/dL (ref 6.0–8.5)

## 2014-06-03 LAB — CBC WITH DIFFERENTIAL/PLATELET
BASOS ABS: 0.1 10*3/uL (ref 0.0–0.2)
Basos: 1 %
EOS: 3 %
Eosinophils Absolute: 0.2 10*3/uL (ref 0.0–0.4)
HEMATOCRIT: 35.3 % (ref 34.0–46.6)
Hemoglobin: 11.8 g/dL (ref 11.1–15.9)
IMMATURE GRANS (ABS): 0 10*3/uL (ref 0.0–0.1)
IMMATURE GRANULOCYTES: 0 %
LYMPHS ABS: 1.8 10*3/uL (ref 0.7–3.1)
Lymphs: 30 %
MCH: 31.5 pg (ref 26.6–33.0)
MCHC: 33.4 g/dL (ref 31.5–35.7)
MCV: 94 fL (ref 79–97)
Monocytes Absolute: 0.5 10*3/uL (ref 0.1–0.9)
Monocytes: 8 %
NEUTROS PCT: 58 %
Neutrophils Absolute: 3.5 10*3/uL (ref 1.4–7.0)
RBC: 3.75 x10E6/uL — ABNORMAL LOW (ref 3.77–5.28)
RDW: 13.4 % (ref 12.3–15.4)
WBC: 6 10*3/uL (ref 3.4–10.8)

## 2014-06-04 ENCOUNTER — Encounter: Payer: Self-pay | Admitting: *Deleted

## 2014-06-30 ENCOUNTER — Other Ambulatory Visit: Payer: Self-pay | Admitting: Internal Medicine

## 2014-06-30 DIAGNOSIS — E785 Hyperlipidemia, unspecified: Secondary | ICD-10-CM

## 2014-06-30 DIAGNOSIS — E781 Pure hyperglyceridemia: Secondary | ICD-10-CM

## 2014-06-30 NOTE — Progress Notes (Signed)
Spoke with patient, patient will call back to scheduled fasting lab appointment after checking her schedule. Placed order for Lipid panel

## 2014-07-16 ENCOUNTER — Ambulatory Visit (INDEPENDENT_AMBULATORY_CARE_PROVIDER_SITE_OTHER): Payer: Medicare Other | Admitting: *Deleted

## 2014-07-16 DIAGNOSIS — Z23 Encounter for immunization: Secondary | ICD-10-CM

## 2014-09-01 ENCOUNTER — Telehealth: Payer: Self-pay | Admitting: *Deleted

## 2014-09-01 NOTE — Telephone Encounter (Signed)
Patient called late afternoon yesterday and left message on voicemail that she has not slept in 2 weeks. Patient has an appointment on 11/2 to be seen by Dr. Mariea Clonts. I spoke with Dr. Mariea Clonts and she stated that she will discuss this at patient's appointment. Tried calling patient this morning at 9:00 and husband stated that patient was still in bed sleeping. Will call back later.

## 2014-09-02 NOTE — Telephone Encounter (Signed)
Patient Notified

## 2014-09-03 ENCOUNTER — Ambulatory Visit: Payer: Medicare Other | Admitting: Internal Medicine

## 2014-09-03 ENCOUNTER — Other Ambulatory Visit: Payer: Medicare Other

## 2014-09-03 DIAGNOSIS — E781 Pure hyperglyceridemia: Secondary | ICD-10-CM

## 2014-09-03 DIAGNOSIS — E785 Hyperlipidemia, unspecified: Secondary | ICD-10-CM

## 2014-09-04 ENCOUNTER — Telehealth: Payer: Self-pay | Admitting: *Deleted

## 2014-09-04 LAB — LIPID PANEL
Chol/HDL Ratio: 3.6 ratio units (ref 0.0–4.4)
Cholesterol, Total: 226 mg/dL — ABNORMAL HIGH (ref 100–199)
HDL: 62 mg/dL (ref >39)
LDL Calculated: 130 mg/dL — ABNORMAL HIGH (ref 0–99)
Triglycerides: 170 mg/dL — ABNORMAL HIGH (ref 0–149)
VLDL Cholesterol Cal: 34 mg/dL (ref 5–40)

## 2014-09-04 NOTE — Telephone Encounter (Signed)
Message copied by Eilene Ghazi on Fri Sep 04, 2014  3:53 PM ------      Message from: [place], [place] L      Created: Fri Sep 04, 2014  1:10 PM       Bad cholesterol has come up.  Triglycerides are a little better. ------

## 2014-09-07 ENCOUNTER — Encounter: Payer: Self-pay | Admitting: Internal Medicine

## 2014-09-07 ENCOUNTER — Ambulatory Visit (INDEPENDENT_AMBULATORY_CARE_PROVIDER_SITE_OTHER): Payer: Medicare Other | Admitting: Internal Medicine

## 2014-09-07 VITALS — BP 138/82 | HR 91 | Temp 98.3°F | Resp 20 | Wt 155.8 lb

## 2014-09-07 DIAGNOSIS — G8929 Other chronic pain: Secondary | ICD-10-CM

## 2014-09-07 DIAGNOSIS — Z0000 Encounter for general adult medical examination without abnormal findings: Secondary | ICD-10-CM

## 2014-09-07 DIAGNOSIS — M4806 Spinal stenosis, lumbar region: Secondary | ICD-10-CM

## 2014-09-07 DIAGNOSIS — F329 Major depressive disorder, single episode, unspecified: Secondary | ICD-10-CM

## 2014-09-07 DIAGNOSIS — G4701 Insomnia due to medical condition: Secondary | ICD-10-CM

## 2014-09-07 DIAGNOSIS — F32A Depression, unspecified: Secondary | ICD-10-CM

## 2014-09-07 DIAGNOSIS — M48061 Spinal stenosis, lumbar region without neurogenic claudication: Secondary | ICD-10-CM

## 2014-09-07 DIAGNOSIS — E785 Hyperlipidemia, unspecified: Secondary | ICD-10-CM

## 2014-09-07 MED ORDER — EZETIMIBE 10 MG PO TABS: 10.0000 mg | ORAL_TABLET | Freq: Every day | ORAL | Status: DC

## 2014-09-07 MED ORDER — DULOXETINE HCL 60 MG PO CPEP: 60.0000 mg | ORAL_CAPSULE | Freq: Every day | ORAL | Status: DC

## 2014-09-07 MED ORDER — VENLAFAXINE HCL ER 75 MG PO CP24: 75.0000 mg | ORAL_CAPSULE | Freq: Every day | ORAL | Status: AC

## 2014-09-07 MED ORDER — DULOXETINE HCL 30 MG PO CPEP: 30.0000 mg | ORAL_CAPSULE | Freq: Every day | ORAL | Status: DC

## 2014-09-07 MED ORDER — VENLAFAXINE HCL ER 37.5 MG PO CP24: 37.5000 mg | ORAL_CAPSULE | Freq: Every day | ORAL | Status: AC

## 2014-09-07 NOTE — Patient Instructions (Addendum)
Will try cymbalta for your depression, pain and insomnia.  Take effexor XR 75mg  daily for one week Then 37.5mg  daily for one week AND at the same time, start cymbalta 30mg  daily also for one week Stop effexor and increase cymbalta to 60mg  daily.   Let me know if this approach is not working.

## 2014-09-07 NOTE — Progress Notes (Signed)
Patient ID: Brittany Mays, female   DOB: 10-02-1941, 73 y.o.   MRN: [id number]   Location:  [facility]  Code Status: full code;  Does have living will and hcpoa but plans to update them--new forms provided  Allergies  Allergen Reactions  . Nsaids Diarrhea and Other (See Comments)    "horrible cramps".  . Sodium Pentobarbital [Pentobarbital] Other (See Comments)    Used as a child in an operation.  MDs told her parents never to let her take this again.  . Codeine Hives and Other (See Comments)    Cramping   . Fentanyl Itching and Other (See Comments)    sweating duragesic patch only; can tolerate IV    Chief Complaint  Patient presents with  . Annual Exam    HPI: Patient is a 73 y.o. white female seen in the office today for annual exam.    Takes forever to fall asleep.  When does, wakes back up.  Up 4-5 times.  Sleeping is terrible.  Total sleep of 3-4 hrs.  Is always tired and cranky, she says.  Sometimes does have to urinate.  Was supposed to have procedure for her bladder and didn't get to have it b/c of her back surgery.  She is also depressed b/c of her back pain.  Can't do what she wants to do.  She expected it to get better sooner.  It's hard for her to say if she had any help from it.  No "pain in hips" anymore.    Using red yeast rice.  Unable to exercise due to her back pain.  Does not want mammograms b/c absolutely does not want more surgery.    Due to go to ophtho.  Some decrease in distance vision.  Review of Systems:  Review of Systems  Constitutional: Negative for weight loss.  HENT: Negative for hearing loss.   Eyes: Positive for blurred vision.  Respiratory: Negative for shortness of breath.   Cardiovascular: Negative for chest pain and leg swelling.  Gastrointestinal: Negative for abdominal pain, diarrhea, constipation, blood in stool and melena.  Genitourinary: Negative for dysuria, urgency and frequency.   Some nocturia  Musculoskeletal: Positive for back pain. Negative for falls.  Skin: Negative for itching and rash.  Neurological: Negative for dizziness, tingling, sensory change, loss of consciousness and headaches.  Endo/Heme/Allergies: Does not bruise/bleed easily.  Psychiatric/Behavioral: Positive for depression. Negative for memory loss. The patient is nervous/anxious and has insomnia.      Past Medical History  Diagnosis Date  . Depression   . Anxiety   . Insomnia   . Pain   . Cataract   . Osteoarthritis of both knees     s/p knee replacements  . Lumbar spinal stenosis     s/p laminectomy  . Hyperparathyroidism, primary     s/p parathyroidectomy  . Rectal prolapse     s/p repair    Past Surgical History  Procedure Laterality Date  . Abdominal hysterectomy    . Knee surgery Bilateral     Knee Replacements  . Spine surgery    . Parathyroidectomy    . Colonoscopy  2013    Social History:   reports that she quit smoking about 5 months ago. Her smoking use included Cigarettes. She has a 50 pack-year smoking history. She does not have any smokeless tobacco history on file. She reports that she does not drink alcohol or use illicit drugs.  Family History  Problem Relation Age of Onset  . COPD Father   . Heart failure Father   . Diabetes Son     Medications: Patient's Medications  New Prescriptions   No medications on file  Previous Medications   CALCIUM-VITAMIN D (OSCAL WITH D) 500-200 MG-UNIT PER TABLET    Take 2 tablets by mouth daily with breakfast.    CYANOCOBALAMIN 100 MCG TABLET    Take 100 mcg by mouth daily.   FISH OIL-OMEGA-3 FATTY ACIDS 1000 MG CAPSULE    Take 1 g by mouth daily.    MULTIPLE VITAMINS-MINERALS (WOMENS MULTI) CAPS    Take one tablet once daily   NYSTATIN CREAM (MYCOSTATIN)    Apply 1 application topically 2 (two) times daily.   OXYCODONE-ACETAMINOPHEN (PERCOCET) 10-325 MG PER TABLET    Take 1 tablet by mouth 2 (two) times daily as needed.     RED YEAST RICE 600 MG TABS    Take 600 mg by mouth 2 (two) times daily.    VENLAFAXINE XR (EFFEXOR XR) 150 MG 24 HR CAPSULE    Take 1 capsule (150 mg total) by mouth daily with breakfast.  Modified Medications   No medications on file  Discontinued Medications   ALPRAZOLAM (XANAX) 0.5 MG TABLET    Take one tablet by mouth twice daily as needed for anxiety   METHOCARBAMOL (ROBAXIN) 500 MG TABLET    Take 1 tablet (500 mg total) by mouth every 8 (eight) hours as needed for muscle spasms.   OXYCODONE-ACETAMINOPHEN (ROXICET) 5-325 MG PER TABLET    Take 1 tablet by mouth every 4 (four) hours as needed for moderate pain.   VITAMIN B-12 (CYANOCOBALAMIN) 100 MCG TABLET    Take 100 mcg by mouth daily.      Physical Exam: Filed Vitals:   09/07/14 1341  BP: 138/82  Pulse: 91  Temp: 98.3 F (36.8 C)  TempSrc: Oral  Resp: 20  Weight: 155 lb 12.8 oz (70.67 kg)  SpO2: 99%  Physical Exam  Constitutional: She is oriented to person, place, and time. She appears well-developed and well-nourished.  HENT:  Head: Normocephalic and atraumatic.  Right Ear: External ear normal.  Left Ear: External ear normal.  Nose: Nose normal.  Mouth/Throat: No oropharyngeal exudate.  Eyes: Conjunctivae and EOM are normal. Pupils are equal, round, and reactive to light.  Neck: Normal range of motion. Neck supple. No JVD present. No tracheal deviation present. No thyromegaly present.  Cardiovascular: Normal rate, regular rhythm, normal heart sounds and intact distal pulses.   Pulmonary/Chest: Effort normal and breath sounds normal. No respiratory distress.  Abdominal: Soft. Bowel sounds are normal. She exhibits no distension and no mass. There is no tenderness.  Musculoskeletal: Normal range of motion. She exhibits no edema or tenderness.  Wearing supportive brace  Neurological: She is alert and oriented to person, place, and time. She has normal reflexes.  Skin: Skin is warm and dry.  Psychiatric: She has a  normal mood and affect.     Labs reviewed: Basic Metabolic Panel:  Recent Labs  04/28/14 1802 04/29/14 0212 04/29/14 0445 06/02/14 1027  NA 142  --  142 139  K 3.8  --  3.7 4.1  CL 102  --  101 99  CO2 30  --  29 25  GLUCOSE 93  --  112* 92  BUN 16  --  11 15  CREATININE 0.72  --  0.65 0.71  CALCIUM 8.5  --  8.4 9.6  TSH  --  0.604  --   --    Liver Function Tests:  Recent Labs  04/28/14 1802 04/29/14 0445 06/02/14 1027  AST 30 34 18  ALT 21 21 11   ALKPHOS 59 64 79  BILITOT 0.2* 0.3 0.2  PROT 5.8* 6.2 6.4  ALBUMIN 2.6* 2.7*  --    No results for input(s): LIPASE, AMYLASE in the last 8760 hours. No results for input(s): AMMONIA in the last 8760 hours. CBC:  Recent Labs  12/09/13 0843 04/28/14 1802 04/29/14 0445 06/02/14 1027  WBC 4.7 6.1 6.8 6.0  NEUTROABS 2.2 3.5  --  3.5  HGB 12.9 10.0* 10.2* 11.8  HCT 38.1 29.2* 30.9* 35.3  MCV 95 97.0 96.9 94  PLT  --  338 350  --    Lipid Panel:  Recent Labs  12/09/13 0843 09/03/14 0933  HDL 62 62  LDLCALC 115* 130*  TRIG 185* 170*  CHOLHDL 3.5 3.6   Assessment/Plan 1. Depression - venlafaxine XR (EFFEXOR XR) 75 MG 24 hr capsule; Take 1 capsule (75 mg total) by mouth daily with breakfast.  Dispense: 7 capsule; Refill: 0 FOR FIRST WEEK - then venlafaxine XR (EFFEXOR XR) 37.5 MG 24 hr capsule; Take 1 capsule (37.5 mg total) by mouth daily with breakfast.  Dispense: 7 capsule; Refill: 0 ALONG WITH - DULoxetine (CYMBALTA) 30 MG capsule; Take 1 capsule (30 mg total) by mouth daily.  Dispense: 7 capsule; Refill: 0 THEN STOP EFFEXOR AND INCREASE CYMBALTA TO - DULoxetine (CYMBALTA) 60 MG capsule; Take 1 capsule (60 mg total) by mouth daily.  Dispense: 30 capsule; Refill: 3 -to let me know if this is not working out  2. Lumbar spinal stenosis -cont current percocet -cont f/u with surgery - CBC With differential/Platelet; Future - Comprehensive metabolic panel; Future  3. Insomnia secondary to chronic  pain -will hopefully improve with change in antidepressant  4. Hyperlipidemia - will try zetia b/c LDL has only gone up and cannot do much exercise at present with her back pain - ezetimibe (ZETIA) 10 MG tablet; Take 1 tablet (10 mg total) by mouth daily.  Dispense: 90 tablet; Refill: 3 - Comprehensive metabolic panel; Future - Hemoglobin A1c; Future - Lipid panel; Future  5. Routine general medical examination at a health care facility -has had flu shot, is up to date on cscope last year normal, refuses mammogram, but breast exam benign today, had pneumonia 23 valent;  Will need prevnar next time;  Insurance will not pay for her to get zostavax and too expensive (called them) -is depressed, cognition good, no falls Labs/tests ordered:   Orders Placed This Encounter  Procedures  . CBC With differential/Platelet    Standing Status: Future     Number of Occurrences:      Standing Expiration Date: 03/08/2015  . Comprehensive metabolic panel    Standing Status: Future     Number of Occurrences:      Standing Expiration Date: 03/08/2015    Order Specific Question:  Has the patient fasted?    Answer:  Yes  . Hemoglobin A1c    Standing Status: Future     Number of Occurrences:      Standing Expiration Date: 03/08/2015  . Lipid panel    Standing Status: Future     Number of Occurrences:      Standing Expiration Date: 03/08/2015    Order Specific Question:  Has the patient fasted?    Answer:  Yes    Next appt:  3 mos with  labs before  Evie Crumpler L. Melchor Kirchgessner, D.O. [facility] [address], [place] [address] Cell Phone (Mon-Fri 8am-5pm):  [phone] On Call:  [phone] & follow prompts after 5pm & weekends Office Phone:  [phone] Office Fax:  [phone]

## 2014-10-09 ENCOUNTER — Ambulatory Visit (INDEPENDENT_AMBULATORY_CARE_PROVIDER_SITE_OTHER): Payer: Medicare Other | Admitting: *Deleted

## 2014-10-09 DIAGNOSIS — Z23 Encounter for immunization: Secondary | ICD-10-CM

## 2014-10-15 ENCOUNTER — Other Ambulatory Visit: Payer: Self-pay | Admitting: Internal Medicine

## 2014-10-15 DIAGNOSIS — F329 Major depressive disorder, single episode, unspecified: Secondary | ICD-10-CM

## 2014-10-15 DIAGNOSIS — F32A Depression, unspecified: Secondary | ICD-10-CM

## 2014-10-15 MED ORDER — DULOXETINE HCL 60 MG PO CPEP: 60.0000 mg | ORAL_CAPSULE | Freq: Every day | ORAL | Status: DC

## 2014-10-20 ENCOUNTER — Telehealth: Payer: Self-pay | Admitting: *Deleted

## 2014-10-20 MED ORDER — DULOXETINE HCL 30 MG PO CPEP: ORAL_CAPSULE | ORAL | Status: DC

## 2014-10-20 NOTE — Telephone Encounter (Signed)
Interesting.  She was tolerating the 30mg  well and said she was great.  I just increased it to 60mg  when I saw her with her husband 12/10.  Maybe she should go back down to the 30mg .  Sometimes a higher dose can be too much.  If she does not agree with that, I will have to try an alternative newer agent.

## 2014-10-20 NOTE — Telephone Encounter (Signed)
Patient called and stated that Cymbalta is not working. Patient is crying and staying in the bed all day. Patient couldn't even carry on a conversation on the phone with me without crying. Please Advise.

## 2014-10-20 NOTE — Telephone Encounter (Signed)
Patient Notified and faxed Rx into pharmacy 

## 2014-10-26 ENCOUNTER — Other Ambulatory Visit: Payer: Self-pay | Admitting: *Deleted

## 2014-10-26 MED ORDER — DULOXETINE HCL 30 MG PO CPEP: ORAL_CAPSULE | ORAL | Status: DC

## 2014-10-26 NOTE — Telephone Encounter (Signed)
Patient requested Rx to be faxed to [facility].

## 2014-10-27 ENCOUNTER — Other Ambulatory Visit: Payer: Self-pay | Admitting: *Deleted

## 2014-10-27 DIAGNOSIS — E785 Hyperlipidemia, unspecified: Secondary | ICD-10-CM

## 2014-10-27 MED ORDER — EZETIMIBE 10 MG PO TABS: 10.0000 mg | ORAL_TABLET | Freq: Every day | ORAL | Status: DC

## 2014-10-27 NOTE — Telephone Encounter (Signed)
[facility] 

## 2014-11-10 ENCOUNTER — Other Ambulatory Visit: Payer: Self-pay | Admitting: *Deleted

## 2014-11-10 DIAGNOSIS — E785 Hyperlipidemia, unspecified: Secondary | ICD-10-CM

## 2014-11-10 MED ORDER — DULOXETINE HCL 30 MG PO CPEP: ORAL_CAPSULE | ORAL | Status: DC

## 2014-11-10 MED ORDER — EZETIMIBE 10 MG PO TABS: ORAL_TABLET | ORAL | Status: DC

## 2014-11-10 NOTE — Telephone Encounter (Signed)
[facility] 

## 2014-11-12 ENCOUNTER — Other Ambulatory Visit: Payer: Self-pay | Admitting: *Deleted

## 2014-11-12 DIAGNOSIS — E785 Hyperlipidemia, unspecified: Secondary | ICD-10-CM

## 2014-11-12 MED ORDER — EZETIMIBE 10 MG PO TABS: ORAL_TABLET | ORAL | Status: DC

## 2014-11-12 MED ORDER — DULOXETINE HCL 30 MG PO CPEP: ORAL_CAPSULE | ORAL | Status: DC

## 2014-11-12 NOTE — Telephone Encounter (Signed)
[facility] 

## 2014-11-24 ENCOUNTER — Other Ambulatory Visit: Payer: Self-pay | Admitting: *Deleted

## 2014-11-24 DIAGNOSIS — E785 Hyperlipidemia, unspecified: Secondary | ICD-10-CM

## 2014-11-24 MED ORDER — DULOXETINE HCL 30 MG PO CPEP: ORAL_CAPSULE | ORAL | Status: DC

## 2014-11-24 MED ORDER — EZETIMIBE 10 MG PO TABS: ORAL_TABLET | ORAL | Status: DC

## 2014-11-24 NOTE — Telephone Encounter (Signed)
[facility] fax refill order

## 2014-12-08 ENCOUNTER — Other Ambulatory Visit: Payer: Medicare Other

## 2014-12-11 ENCOUNTER — Ambulatory Visit: Payer: Medicare Other | Admitting: Internal Medicine

## 2014-12-15 ENCOUNTER — Other Ambulatory Visit: Payer: Commercial Managed Care - HMO

## 2014-12-15 DIAGNOSIS — M4806 Spinal stenosis, lumbar region: Secondary | ICD-10-CM | POA: Diagnosis not present

## 2014-12-15 DIAGNOSIS — M48061 Spinal stenosis, lumbar region without neurogenic claudication: Secondary | ICD-10-CM

## 2014-12-15 DIAGNOSIS — E785 Hyperlipidemia, unspecified: Secondary | ICD-10-CM

## 2014-12-16 LAB — COMPREHENSIVE METABOLIC PANEL
ALT: 13 IU/L (ref 0–32)
AST: 20 IU/L (ref 0–40)
Albumin/Globulin Ratio: 1.8 (ref 1.1–2.5)
Albumin: 4.4 g/dL (ref 3.5–4.8)
Alkaline Phosphatase: 67 IU/L (ref 39–117)
BUN/Creatinine Ratio: 20 (ref 11–26)
BUN: 16 mg/dL (ref 8–27)
Bilirubin Total: 0.3 mg/dL (ref 0.0–1.2)
CO2: 27 mmol/L (ref 18–29)
Calcium: 9.6 mg/dL (ref 8.7–10.3)
Chloride: 103 mmol/L (ref 97–108)
Creatinine, Ser: 0.81 mg/dL (ref 0.57–1.00)
GFR calc Af Amer: 82 mL/min/{1.73_m2} (ref >59)
GFR calc non Af Amer: 71 mL/min/{1.73_m2} (ref >59)
Globulin, Total: 2.4 g/dL (ref 1.5–4.5)
Glucose: 99 mg/dL (ref 65–99)
Potassium: 5 mmol/L (ref 3.5–5.2)
Sodium: 144 mmol/L (ref 134–144)
Total Protein: 6.8 g/dL (ref 6.0–8.5)

## 2014-12-16 LAB — CBC WITH DIFFERENTIAL
Basophils Absolute: 0.1 10*3/uL (ref 0.0–0.2)
Basos: 1 %
Eos: 4 %
Eosinophils Absolute: 0.2 10*3/uL (ref 0.0–0.4)
HCT: 40.3 % (ref 34.0–46.6)
Hemoglobin: 13.6 g/dL (ref 11.1–15.9)
Immature Grans (Abs): 0 10*3/uL (ref 0.0–0.1)
Immature Granulocytes: 0 %
Lymphocytes Absolute: 1.8 10*3/uL (ref 0.7–3.1)
Lymphs: 35 %
MCH: 32.4 pg (ref 26.6–33.0)
MCHC: 33.7 g/dL (ref 31.5–35.7)
MCV: 96 fL (ref 79–97)
Monocytes Absolute: 0.6 10*3/uL (ref 0.1–0.9)
Monocytes: 11 %
Neutrophils Absolute: 2.5 10*3/uL (ref 1.4–7.0)
Neutrophils Relative %: 49 %
RBC: 4.2 x10E6/uL (ref 3.77–5.28)
RDW: 13.3 % (ref 12.3–15.4)
WBC: 5.2 10*3/uL (ref 3.4–10.8)

## 2014-12-16 LAB — HEMOGLOBIN A1C
Est. average glucose Bld gHb Est-mCnc: 117 mg/dL
Hgb A1c MFr Bld: 5.7 % — ABNORMAL HIGH (ref 4.8–5.6)

## 2014-12-16 LAB — LIPID PANEL
Chol/HDL Ratio: 3.3 ratio units (ref 0.0–4.4)
Cholesterol, Total: 196 mg/dL (ref 100–199)
HDL: 59 mg/dL (ref >39)
LDL Calculated: 96 mg/dL (ref 0–99)
Triglycerides: 205 mg/dL — ABNORMAL HIGH (ref 0–149)
VLDL Cholesterol Cal: 41 mg/dL — ABNORMAL HIGH (ref 5–40)

## 2014-12-17 ENCOUNTER — Other Ambulatory Visit: Payer: Self-pay

## 2014-12-21 ENCOUNTER — Ambulatory Visit: Payer: Self-pay | Admitting: Internal Medicine

## 2014-12-28 ENCOUNTER — Encounter: Payer: Self-pay | Admitting: Internal Medicine

## 2014-12-28 ENCOUNTER — Ambulatory Visit (INDEPENDENT_AMBULATORY_CARE_PROVIDER_SITE_OTHER): Payer: Commercial Managed Care - HMO | Admitting: Internal Medicine

## 2014-12-28 VITALS — BP 122/78 | HR 78 | Temp 98.7°F | Resp 12 | Wt 156.4 lb

## 2014-12-28 DIAGNOSIS — F329 Major depressive disorder, single episode, unspecified: Secondary | ICD-10-CM

## 2014-12-28 DIAGNOSIS — M4806 Spinal stenosis, lumbar region: Secondary | ICD-10-CM | POA: Diagnosis not present

## 2014-12-28 DIAGNOSIS — R739 Hyperglycemia, unspecified: Secondary | ICD-10-CM | POA: Diagnosis not present

## 2014-12-28 DIAGNOSIS — Z23 Encounter for immunization: Secondary | ICD-10-CM | POA: Diagnosis not present

## 2014-12-28 DIAGNOSIS — Z720 Tobacco use: Secondary | ICD-10-CM

## 2014-12-28 DIAGNOSIS — F32A Depression, unspecified: Secondary | ICD-10-CM

## 2014-12-28 DIAGNOSIS — G4700 Insomnia, unspecified: Secondary | ICD-10-CM | POA: Diagnosis not present

## 2014-12-28 DIAGNOSIS — E782 Mixed hyperlipidemia: Secondary | ICD-10-CM | POA: Diagnosis not present

## 2014-12-28 DIAGNOSIS — M48061 Spinal stenosis, lumbar region without neurogenic claudication: Secondary | ICD-10-CM

## 2014-12-28 MED ORDER — MELATONIN 3 MG PO CAPS
3.0000 mg | ORAL_CAPSULE | Freq: Every evening | ORAL | Status: DC | PRN
Start: 2014-12-28 — End: 2017-01-23

## 2014-12-28 MED ORDER — ZOSTER VACCINE LIVE 19400 UNT/0.65ML ~~LOC~~ SOLR: 0.6500 mL | Freq: Once | SUBCUTANEOUS | Status: DC

## 2014-12-28 NOTE — Progress Notes (Signed)
Patient ID: Brittany Mays, female   DOB: [birth year], 74 y.o.   MRN: [id number]   Location:  [facility]  Code Status: full code  Allergies  Allergen Reactions  . Nsaids Diarrhea and Other (See Comments)    "horrible cramps".  . Sodium Pentobarbital [Pentobarbital] Other (See Comments)    Used as a child in an operation.  MDs told her parents never to let her take this again.  . Codeine Hives and Other (See Comments)    Cramping   . Fentanyl Itching and Other (See Comments)    sweating duragesic patch only; can tolerate IV    Chief Complaint  Patient presents with  . Medical Management of Chronic Issues    3 month follow-up, discuss labs (Copy printed)   . Sleeping Problem    Trouble falling and staying asleep     HPI: Patient is a 74 y.o. white female seen in the office today for med mgt of chronic diseases.    She has difficulty falling asleep and staying asleep.  If finally falls asleep very late--then half the day is shot.  Has tried everything--sometimes will work for a little then stops.  Says her physical activity level is better than it had been.  Has been trying to walk and do some exercise.  Is not walking 30 mins a day.  Has tried [place], all kinds of otc (somnapure worked transiently, luna didn't work, has NOT tried melatonin), has tried calming teas like chamomile.  Can't stand milk so has not tried warm milk.  Reads before bed.    Other issue is the triglycerides and elevated sugar.  Says pasta is probably the problem.    Uses robaxin before bed hoping that will help but it doesn't really.  Percocet twice daily, but doesn't make her tired.  Gabapentin doesn't either.   Review of Systems:  Review of Systems  Constitutional: Negative for fever and chills.  HENT: Negative for congestion.   Eyes: Negative for blurred vision.  Respiratory: Negative for shortness of breath.   Cardiovascular: Negative for chest pain and leg  swelling.  Gastrointestinal: Negative for abdominal pain.  Genitourinary: Negative for dysuria, urgency and frequency.  Musculoskeletal: Positive for back pain. Negative for falls.  Skin: Negative for rash.  Neurological: Negative for dizziness and loss of consciousness.  Psychiatric/Behavioral: Negative for memory loss. The patient has insomnia.      Past Medical History  Diagnosis Date  . Depression   . Anxiety   . Insomnia   . Pain   . Cataract   . Osteoarthritis of both knees     s/p knee replacements  . Lumbar spinal stenosis     s/p laminectomy  . Hyperparathyroidism, primary     s/p parathyroidectomy  . Rectal prolapse     s/p repair    Past Surgical History  Procedure Laterality Date  . Abdominal hysterectomy    . Knee surgery Bilateral     Knee Replacements  . Spine surgery    . Parathyroidectomy    . Colonoscopy  2013    Social History:   reports that she quit smoking about 9 months ago. Her smoking use included Cigarettes. She has a 50 pack-year smoking history. She does not have any smokeless tobacco history on file. She reports that she does not drink alcohol or use illicit drugs.  Family History  Problem Relation Age of Onset  . COPD Father   . Heart  failure Father   . Diabetes Son     Medications: Patient's Medications  New Prescriptions   No medications on file  Previous Medications   CALCIUM-VITAMIN D (OSCAL WITH D) 500-200 MG-UNIT PER TABLET    Take 2 tablets by mouth daily with breakfast.    CYANOCOBALAMIN 100 MCG TABLET    Take 100 mcg by mouth daily.   DULOXETINE (CYMBALTA) 30 MG CAPSULE    Take one tablet by mouth once daily for depression   EZETIMIBE (ZETIA) 10 MG TABLET    Take one tablet by mouth once daily for cholesterol   FISH OIL-OMEGA-3 FATTY ACIDS 1000 MG CAPSULE    Take 1 g by mouth daily.    GABAPENTIN (NEURONTIN) 300 MG CAPSULE    Take 300 mg by mouth. 1 by mouth twice daily   METHOCARBAMOL (ROBAXIN) 750 MG TABLET    Take 750  mg by mouth. 1 by mouth twice daily   MULTIPLE VITAMINS-MINERALS (WOMENS MULTI) CAPS    Take one tablet once daily   OXYCODONE-ACETAMINOPHEN (PERCOCET) 10-325 MG PER TABLET    Take 1 tablet by mouth 2 (two) times daily as needed.   Modified Medications   No medications on file  Discontinued Medications   NYSTATIN CREAM (MYCOSTATIN)    Apply 1 application topically 2 (two) times daily.   RED YEAST RICE 600 MG TABS    Take 600 mg by mouth 2 (two) times daily.      Physical Exam: Filed Vitals:   12/28/14 1434  BP: 122/78  Pulse: 78  Temp: 98.7 F (37.1 C)  TempSrc: Oral  Resp: 12  Weight: 156 lb 6.4 oz (70.943 kg)  SpO2: 97%  Physical Exam  Constitutional: She is oriented to person, place, and time. She appears well-developed and well-nourished.  Cardiovascular: Normal rate, regular rhythm, normal heart sounds and intact distal pulses.   Pulmonary/Chest: Effort normal and breath sounds normal.  Abdominal: Soft. Bowel sounds are normal.  Musculoskeletal: Normal range of motion.  Neurological: She is alert and oriented to person, place, and time.  Skin: Skin is warm and dry.  Psychiatric: She has a normal mood and affect.    Labs reviewed: Basic Metabolic Panel:  Recent Labs  04/29/14 0212 04/29/14 0445 06/02/14 1027 12/15/14 1310  NA  --  142 139 144  K  --  3.7 4.1 5.0  CL  --  101 99 103  CO2  --  29 25 27   GLUCOSE  --  112* 92 99  BUN  --  11 15 16   CREATININE  --  0.65 0.71 0.81  CALCIUM  --  8.4 9.6 9.6  TSH 0.604  --   --   --    Liver Function Tests:  Recent Labs  04/28/14 1802 04/29/14 0445 06/02/14 1027 12/15/14 1310  AST 30 34 18 20  ALT 21 21 11 13   ALKPHOS 59 64 79 67  BILITOT 0.2* 0.3 0.2 0.3  PROT 5.8* 6.2 6.4 6.8  ALBUMIN 2.6* 2.7*  --   --    No results for input(s): LIPASE, AMYLASE in the last 8760 hours. No results for input(s): AMMONIA in the last 8760 hours. CBC:  Recent Labs  04/28/14 1802 04/29/14 0445 06/02/14 1027  12/15/14 1310  WBC 6.1 6.8 6.0 5.2  NEUTROABS 3.5  --  3.5 2.5  HGB 10.0* 10.2* 11.8 13.6  HCT 29.2* 30.9* 35.3 40.3  MCV 97.0 96.9 94 96  PLT 338 350  --   --  Lipid Panel:  Recent Labs  09/03/14 0933 12/15/14 1310  HDL 62 59  LDLCALC 130* 96  TRIG 170* 205*  CHOLHDL 3.6 3.3   Lab Results  Component Value Date   HGBA1C 5.7* 12/15/2014    Assessment/Plan 1. Insomnia - will try melatonin, has tried many other things as in hpi - Melatonin 3 MG CAPS; Take 1 capsule (3 mg total) by mouth at bedtime as needed (insomnia).  Dispense: 30 capsule; Refill: 0  2. Hyperglycemia -cont to increase exercise--try to get up to 30 mins of walking daily and cut back on sweets and starchy foods - Hemoglobin A1c; Future - Basic metabolic panel; Future  3. Depression -cont cymbalta which has helped back pain  4. Mixed hyperlipidemia -cont zetia (samples for 2wks provided) - dietary changes and exercise as above -Lipid panel; Future  5. Lumbar spinal stenosis -cont gabapentin bid which has helped, bid percocet prn and occasional robaxin for muscle spasms  6. Need for shingles vaccine -Rx provided--should be able to get through drug plan at pharmacy now - zoster vaccine live, PF, (ZOSTAVAX) 02774 UNT/0.65ML injection; Inject 19,400 Units into the skin once.  Dispense: 1 each; Refill: 0  7. Tobacco abuse -says she'll quit again one day, but husband also smokes and is not ready to quit  Labs/tests ordered:   Orders Placed This Encounter  Procedures  . Hemoglobin A1c    Standing Status: Future     Number of Occurrences:      Standing Expiration Date: 06/28/2015  . Lipid panel    Standing Status: Future     Number of Occurrences:      Standing Expiration Date: 06/28/2015    Order Specific Question:  Has the patient fasted?    Answer:  Yes  . Basic metabolic panel    Standing Status: Future     Number of Occurrences:      Standing Expiration Date: 06/28/2015    Order Specific  Question:  Has the patient fasted?    Answer:  Yes    Next appt:  3 mos  Zakir Henner L. Alesi Zachery, D.O. [facility] [address], [place] [address] Cell Phone (Mon-Fri 8am-5pm):  [phone] On Call:  [phone] & follow prompts after 5pm & weekends Office Phone:  [phone] Office Fax:  [phone]

## 2015-01-14 ENCOUNTER — Ambulatory Visit (INDEPENDENT_AMBULATORY_CARE_PROVIDER_SITE_OTHER): Payer: Commercial Managed Care - HMO | Admitting: Nurse Practitioner

## 2015-01-14 ENCOUNTER — Encounter: Payer: Self-pay | Admitting: Nurse Practitioner

## 2015-01-14 VITALS — BP 110/76 | HR 94 | Temp 97.9°F | Resp 20 | Ht 62.0 in | Wt 154.6 lb

## 2015-01-14 DIAGNOSIS — M25511 Pain in right shoulder: Secondary | ICD-10-CM | POA: Diagnosis not present

## 2015-01-14 NOTE — Progress Notes (Signed)
Patient ID: Brittany Mays, female   DOB: [birth year], 74 y.o.   MRN: [id number]    PCP: Hollace Kinnier, DO  Allergies  Allergen Reactions  . Nsaids Diarrhea and Other (See Comments)    "horrible cramps".  . Sodium Pentobarbital [Pentobarbital] Other (See Comments)    Used as a child in an operation.  MDs told her parents never to let her take this again.  . Codeine Hives and Other (See Comments)    Cramping   . Fentanyl Itching and Other (See Comments)    sweating duragesic patch only; can tolerate IV    Chief Complaint  Patient presents with  . Referral    Patient c/o pain in right shoulder     HPI: Patient is a 74 y.o. female seen in the office today due to a painful shoulder. Would like to see orthopedic. Already has an appt with Dr Ninfa Linden at [facility] and needs a formal referral.  Hx of rotator cuff surgery over 10 years ago. Becoming more painful, decreased ROM (can not even unhook her bra) over the past several weeks.   No injury. No redness or swelling.  No recent physical therapy.  No lose of strength just decreased ROM with pain.   Review of Systems:  Review of Systems  Musculoskeletal: Positive for arthralgias (shoulder pain). Negative for myalgias, joint swelling and neck pain.  All other systems reviewed and are negative.   Past Medical History  Diagnosis Date  . Depression   . Anxiety   . Insomnia   . Pain   . Cataract   . Osteoarthritis of both knees     s/p knee replacements  . Lumbar spinal stenosis     s/p laminectomy  . Hyperparathyroidism, primary     s/p parathyroidectomy  . Rectal prolapse     s/p repair   Past Surgical History  Procedure Laterality Date  . Abdominal hysterectomy    . Knee surgery Bilateral     Knee Replacements  . Spine surgery    . Parathyroidectomy    . Colonoscopy  2013   Social History:   reports that she has been smoking Cigarettes.  She has a 50 pack-year smoking history. She does not have any  smokeless tobacco history on file. She reports that she does not drink alcohol or use illicit drugs.  Family History  Problem Relation Age of Onset  . COPD Father   . Heart failure Father   . Diabetes Son     Medications: Patient's Medications  New Prescriptions   No medications on file  Previous Medications   CALCIUM-VITAMIN D (OSCAL WITH D) 500-200 MG-UNIT PER TABLET    Take 2 tablets by mouth daily with breakfast.    CYANOCOBALAMIN 100 MCG TABLET    Take 100 mcg by mouth daily.   DULOXETINE (CYMBALTA) 30 MG CAPSULE    Take one tablet by mouth once daily for depression   EZETIMIBE (ZETIA) 10 MG TABLET    Take one tablet by mouth once daily for cholesterol   FISH OIL-OMEGA-3 FATTY ACIDS 1000 MG CAPSULE    Take 1 g by mouth daily.    GABAPENTIN (NEURONTIN) 300 MG CAPSULE    Take 300 mg by mouth. 1 by mouth twice daily   MELATONIN 3 MG CAPS    Take 1 capsule (3 mg total) by mouth at bedtime as needed (insomnia).   METHOCARBAMOL (ROBAXIN) 750 MG TABLET    Take 750 mg by mouth. 1 by  mouth twice daily   MULTIPLE VITAMINS-MINERALS (WOMENS MULTI) CAPS    Take one tablet once daily   OXYCODONE-ACETAMINOPHEN (PERCOCET) 10-325 MG PER TABLET    Take 1 tablet by mouth 2 (two) times daily as needed.    ZOSTER VACCINE LIVE, PF, (ZOSTAVAX) 36468 UNT/0.65ML INJECTION    Inject 19,400 Units into the skin once.  Modified Medications   No medications on file  Discontinued Medications   No medications on file     Physical Exam:  Filed Vitals:   01/14/15 1426  BP: 110/76  Pulse: 94  Temp: 97.9 F (36.6 C)  TempSrc: Oral  Resp: 20  Height: 5\' 2"  (1.575 m)  Weight: 154 lb 9.6 oz (70.126 kg)  SpO2: 95%    Physical Exam  Constitutional: She is oriented to person, place, and time. She appears well-developed and well-nourished.  Cardiovascular: Normal rate, regular rhythm and normal heart sounds.   Pulmonary/Chest: Effort normal and breath sounds normal.  Musculoskeletal: She exhibits  tenderness (to right shoulder). She exhibits no edema.       Right shoulder: She exhibits decreased range of motion and tenderness. She exhibits no bony tenderness, no swelling, no effusion and no crepitus.  Neurological: She is alert and oriented to person, place, and time.  Skin: Skin is warm and dry.    Labs reviewed: Basic Metabolic Panel:  Recent Labs  04/29/14 0212 04/29/14 0445 06/02/14 1027 12/15/14 1310  NA  --  142 139 144  K  --  3.7 4.1 5.0  CL  --  101 99 103  CO2  --  29 25 27   GLUCOSE  --  112* 92 99  BUN  --  11 15 16   CREATININE  --  0.65 0.71 0.81  CALCIUM  --  8.4 9.6 9.6  TSH 0.604  --   --   --    Liver Function Tests:  Recent Labs  04/28/14 1802 04/29/14 0445 06/02/14 1027 12/15/14 1310  AST 30 34 18 20  ALT 21 21 11 13   ALKPHOS 59 64 79 67  BILITOT 0.2* 0.3 0.2 0.3  PROT 5.8* 6.2 6.4 6.8  ALBUMIN 2.6* 2.7*  --   --    No results for input(s): LIPASE, AMYLASE in the last 8760 hours. No results for input(s): AMMONIA in the last 8760 hours. CBC:  Recent Labs  04/28/14 1802 04/29/14 0445 06/02/14 1027 12/15/14 1310  WBC 6.1 6.8 6.0 5.2  NEUTROABS 3.5  --  3.5 2.5  HGB 10.0* 10.2* 11.8 13.6  HCT 29.2* 30.9* 35.3 40.3  MCV 97.0 96.9 94 96  PLT 338 350  --   --    Lipid Panel:  Recent Labs  09/03/14 0933 12/15/14 1310  CHOL 226* 196  HDL 62 59  LDLCALC 130* 96  TRIG 170* 205*  CHOLHDL 3.6 3.3   TSH:  Recent Labs  04/29/14 0212  TSH 0.604   A1C: Lab Results  Component Value Date   HGBA1C 5.7* 12/15/2014     Assessment/Plan 1. Pain in joint, shoulder region, right - Ambulatory referral to Orthopedics for further evaluation and treatment

## 2015-01-19 DIAGNOSIS — M7541 Impingement syndrome of right shoulder: Secondary | ICD-10-CM | POA: Diagnosis not present

## 2015-01-28 DIAGNOSIS — M47896 Other spondylosis, lumbar region: Secondary | ICD-10-CM | POA: Diagnosis not present

## 2015-01-28 DIAGNOSIS — M4716 Other spondylosis with myelopathy, lumbar region: Secondary | ICD-10-CM | POA: Diagnosis not present

## 2015-01-28 DIAGNOSIS — M4806 Spinal stenosis, lumbar region: Secondary | ICD-10-CM | POA: Diagnosis not present

## 2015-02-10 ENCOUNTER — Other Ambulatory Visit: Payer: Self-pay | Admitting: Orthopaedic Surgery

## 2015-02-10 DIAGNOSIS — M25511 Pain in right shoulder: Secondary | ICD-10-CM

## 2015-02-22 ENCOUNTER — Ambulatory Visit: Payer: Self-pay | Admitting: Internal Medicine

## 2015-02-25 DIAGNOSIS — M4716 Other spondylosis with myelopathy, lumbar region: Secondary | ICD-10-CM | POA: Diagnosis not present

## 2015-02-25 DIAGNOSIS — M4806 Spinal stenosis, lumbar region: Secondary | ICD-10-CM | POA: Diagnosis not present

## 2015-03-04 ENCOUNTER — Other Ambulatory Visit: Payer: Self-pay

## 2015-03-11 ENCOUNTER — Other Ambulatory Visit (HOSPITAL_COMMUNITY): Payer: Self-pay | Admitting: Orthopaedic Surgery

## 2015-03-11 DIAGNOSIS — M25552 Pain in left hip: Secondary | ICD-10-CM

## 2015-03-19 ENCOUNTER — Other Ambulatory Visit: Payer: Self-pay | Admitting: Orthopaedic Surgery

## 2015-03-19 DIAGNOSIS — M25552 Pain in left hip: Secondary | ICD-10-CM

## 2015-03-22 ENCOUNTER — Ambulatory Visit (HOSPITAL_COMMUNITY): Admission: RE | Admit: 2015-03-22 | Payer: Commercial Managed Care - HMO | Source: Ambulatory Visit

## 2015-03-24 DIAGNOSIS — M47896 Other spondylosis, lumbar region: Secondary | ICD-10-CM | POA: Diagnosis not present

## 2015-03-24 DIAGNOSIS — M1612 Unilateral primary osteoarthritis, left hip: Secondary | ICD-10-CM | POA: Diagnosis not present

## 2015-04-03 ENCOUNTER — Other Ambulatory Visit: Payer: Commercial Managed Care - HMO

## 2015-04-11 ENCOUNTER — Ambulatory Visit
Admission: RE | Admit: 2015-04-11 | Discharge: 2015-04-11 | Disposition: A | Discharge status: Home / Self Care | Payer: Commercial Managed Care - HMO | Source: Ambulatory Visit | Attending: Orthopaedic Surgery | Admitting: Orthopaedic Surgery

## 2015-04-11 DIAGNOSIS — M1612 Unilateral primary osteoarthritis, left hip: Secondary | ICD-10-CM | POA: Diagnosis not present

## 2015-04-11 DIAGNOSIS — M25552 Pain in left hip: Secondary | ICD-10-CM

## 2015-04-14 DIAGNOSIS — M7541 Impingement syndrome of right shoulder: Secondary | ICD-10-CM | POA: Diagnosis not present

## 2015-04-14 DIAGNOSIS — M1612 Unilateral primary osteoarthritis, left hip: Secondary | ICD-10-CM | POA: Diagnosis not present

## 2015-04-28 DIAGNOSIS — M47896 Other spondylosis, lumbar region: Secondary | ICD-10-CM | POA: Diagnosis not present

## 2015-04-29 ENCOUNTER — Other Ambulatory Visit: Payer: Self-pay | Admitting: Orthopaedic Surgery

## 2015-04-29 DIAGNOSIS — M5115 Intervertebral disc disorders with radiculopathy, thoracolumbar region: Secondary | ICD-10-CM

## 2015-05-07 ENCOUNTER — Ambulatory Visit
Admission: RE | Admit: 2015-05-07 | Discharge: 2015-05-07 | Disposition: A | Discharge status: Home / Self Care | Payer: Commercial Managed Care - HMO | Source: Ambulatory Visit | Attending: Orthopaedic Surgery | Admitting: Orthopaedic Surgery

## 2015-05-07 DIAGNOSIS — M4316 Spondylolisthesis, lumbar region: Secondary | ICD-10-CM | POA: Diagnosis not present

## 2015-05-07 DIAGNOSIS — M79605 Pain in left leg: Secondary | ICD-10-CM | POA: Diagnosis not present

## 2015-05-07 DIAGNOSIS — M5115 Intervertebral disc disorders with radiculopathy, thoracolumbar region: Secondary | ICD-10-CM | POA: Diagnosis not present

## 2015-05-07 DIAGNOSIS — M47816 Spondylosis without myelopathy or radiculopathy, lumbar region: Secondary | ICD-10-CM | POA: Diagnosis not present

## 2015-05-13 DIAGNOSIS — M5115 Intervertebral disc disorders with radiculopathy, thoracolumbar region: Secondary | ICD-10-CM | POA: Diagnosis not present

## 2015-05-13 DIAGNOSIS — Z79891 Long term (current) use of opiate analgesic: Secondary | ICD-10-CM | POA: Diagnosis not present

## 2015-05-13 DIAGNOSIS — G894 Chronic pain syndrome: Secondary | ICD-10-CM | POA: Diagnosis not present

## 2015-05-13 DIAGNOSIS — M791 Myalgia: Secondary | ICD-10-CM | POA: Diagnosis not present

## 2015-05-13 DIAGNOSIS — Z79899 Other long term (current) drug therapy: Secondary | ICD-10-CM | POA: Diagnosis not present

## 2015-06-24 DIAGNOSIS — M5115 Intervertebral disc disorders with radiculopathy, thoracolumbar region: Secondary | ICD-10-CM | POA: Diagnosis not present

## 2015-07-05 ENCOUNTER — Telehealth: Payer: Self-pay

## 2015-07-05 NOTE — Telephone Encounter (Signed)
-----   Message from Gayland Curry, DO sent at 07/05/2015  9:12 AM EDT ----- Please call pt and ask her to come in for fasting labs.  They were ordered as future but not done so I extended them.

## 2015-07-05 NOTE — Telephone Encounter (Signed)
Patient called back, to come in Wed 8/31 for fasting lab

## 2015-07-05 NOTE — Telephone Encounter (Signed)
Left message with patient's husband to have patient return call when she wakes up.

## 2015-07-07 ENCOUNTER — Other Ambulatory Visit: Payer: Self-pay

## 2015-07-08 HISTORY — PX: BACK SURGERY: SHX140

## 2015-07-14 ENCOUNTER — Other Ambulatory Visit: Payer: Self-pay

## 2015-07-26 ENCOUNTER — Telehealth: Payer: Self-pay | Admitting: *Deleted

## 2015-07-26 NOTE — Telephone Encounter (Signed)
Patient called and stated that she just cannot afford the Zetia and wants it changed to something else. Also she wants you to know that on 08/03/15 she is going into surgery on her back, one of the fusions did not heal right so she has to get it corrected. Please Advise.

## 2015-07-26 NOTE — Telephone Encounter (Signed)
Alternatives are statin or fibrate medications.  I thought she didn't tolerate statins in the past.   I can't find anything about it recently.  If she did not have problems, we'll put her on lipitor 20mg  daily with her evening meal

## 2015-07-27 ENCOUNTER — Other Ambulatory Visit: Payer: Commercial Managed Care - HMO

## 2015-07-27 DIAGNOSIS — E782 Mixed hyperlipidemia: Secondary | ICD-10-CM

## 2015-07-27 DIAGNOSIS — R739 Hyperglycemia, unspecified: Secondary | ICD-10-CM

## 2015-07-27 NOTE — Telephone Encounter (Signed)
Patient stated that she will try it but she has Zetia right now to finish up and her surgery next week so she doesn't want to make any changes right now. Stated that she will call us when she wants to start taking it and we can call it in at that time. Agreed.

## 2015-07-28 LAB — BASIC METABOLIC PANEL
BUN/Creatinine Ratio: 13 (ref 11–26)
BUN: 10 mg/dL (ref 8–27)
CO2: 27 mmol/L (ref 18–29)
Calcium: 9.5 mg/dL (ref 8.7–10.3)
Chloride: 103 mmol/L (ref 97–108)
Creatinine, Ser: 0.76 mg/dL (ref 0.57–1.00)
GFR calc Af Amer: 88 mL/min/{1.73_m2} (ref >59)
GFR calc non Af Amer: 76 mL/min/{1.73_m2} (ref >59)
Glucose: 95 mg/dL (ref 65–99)
Potassium: 4.7 mmol/L (ref 3.5–5.2)
Sodium: 145 mmol/L — ABNORMAL HIGH (ref 134–144)

## 2015-07-28 LAB — HEMOGLOBIN A1C
Est. average glucose Bld gHb Est-mCnc: 123 mg/dL
Hgb A1c MFr Bld: 5.9 % — ABNORMAL HIGH (ref 4.8–5.6)

## 2015-07-28 LAB — LIPID PANEL
Chol/HDL Ratio: 4.7 ratio units — ABNORMAL HIGH (ref 0.0–4.4)
Cholesterol, Total: 199 mg/dL (ref 100–199)
HDL: 42 mg/dL (ref >39)
LDL Calculated: 117 mg/dL — ABNORMAL HIGH (ref 0–99)
Triglycerides: 198 mg/dL — ABNORMAL HIGH (ref 0–149)
VLDL Cholesterol Cal: 40 mg/dL (ref 5–40)

## 2015-07-29 DIAGNOSIS — M960 Pseudarthrosis after fusion or arthrodesis: Secondary | ICD-10-CM | POA: Insufficient documentation

## 2015-07-29 DIAGNOSIS — Z01818 Encounter for other preprocedural examination: Secondary | ICD-10-CM | POA: Diagnosis not present

## 2015-08-02 DIAGNOSIS — M545 Low back pain: Secondary | ICD-10-CM | POA: Diagnosis not present

## 2015-08-02 DIAGNOSIS — M5115 Intervertebral disc disorders with radiculopathy, thoracolumbar region: Secondary | ICD-10-CM | POA: Diagnosis not present

## 2015-08-02 DIAGNOSIS — M4716 Other spondylosis with myelopathy, lumbar region: Secondary | ICD-10-CM | POA: Diagnosis not present

## 2015-08-03 DIAGNOSIS — M1990 Unspecified osteoarthritis, unspecified site: Secondary | ICD-10-CM | POA: Diagnosis not present

## 2015-08-03 DIAGNOSIS — Z96653 Presence of artificial knee joint, bilateral: Secondary | ICD-10-CM | POA: Diagnosis not present

## 2015-08-03 DIAGNOSIS — F329 Major depressive disorder, single episode, unspecified: Secondary | ICD-10-CM | POA: Diagnosis not present

## 2015-08-03 DIAGNOSIS — N390 Urinary tract infection, site not specified: Secondary | ICD-10-CM | POA: Diagnosis not present

## 2015-08-03 DIAGNOSIS — Z0000 Encounter for general adult medical examination without abnormal findings: Secondary | ICD-10-CM | POA: Diagnosis not present

## 2015-08-03 DIAGNOSIS — M961 Postlaminectomy syndrome, not elsewhere classified: Secondary | ICD-10-CM | POA: Diagnosis not present

## 2015-08-03 DIAGNOSIS — M5135 Other intervertebral disc degeneration, thoracolumbar region: Secondary | ICD-10-CM | POA: Diagnosis not present

## 2015-08-03 DIAGNOSIS — R54 Age-related physical debility: Secondary | ICD-10-CM | POA: Diagnosis not present

## 2015-08-03 DIAGNOSIS — F17210 Nicotine dependence, cigarettes, uncomplicated: Secondary | ICD-10-CM | POA: Diagnosis not present

## 2015-08-03 DIAGNOSIS — M960 Pseudarthrosis after fusion or arthrodesis: Secondary | ICD-10-CM | POA: Diagnosis not present

## 2015-08-03 DIAGNOSIS — F419 Anxiety disorder, unspecified: Secondary | ICD-10-CM | POA: Diagnosis not present

## 2015-08-03 DIAGNOSIS — T84296A Other mechanical complication of internal fixation device of vertebrae, initial encounter: Secondary | ICD-10-CM | POA: Diagnosis not present

## 2015-08-03 DIAGNOSIS — M4716 Other spondylosis with myelopathy, lumbar region: Secondary | ICD-10-CM | POA: Diagnosis not present

## 2015-08-03 DIAGNOSIS — Z981 Arthrodesis status: Secondary | ICD-10-CM | POA: Diagnosis not present

## 2015-08-03 DIAGNOSIS — Z472 Encounter for removal of internal fixation device: Secondary | ICD-10-CM | POA: Diagnosis not present

## 2015-08-07 DIAGNOSIS — T84216D Breakdown (mechanical) of internal fixation device of vertebrae, subsequent encounter: Secondary | ICD-10-CM | POA: Diagnosis not present

## 2015-08-07 DIAGNOSIS — M961 Postlaminectomy syndrome, not elsewhere classified: Secondary | ICD-10-CM | POA: Diagnosis not present

## 2015-08-07 DIAGNOSIS — N390 Urinary tract infection, site not specified: Secondary | ICD-10-CM | POA: Diagnosis not present

## 2015-08-11 DIAGNOSIS — M961 Postlaminectomy syndrome, not elsewhere classified: Secondary | ICD-10-CM | POA: Diagnosis not present

## 2015-08-11 DIAGNOSIS — N390 Urinary tract infection, site not specified: Secondary | ICD-10-CM | POA: Diagnosis not present

## 2015-08-11 DIAGNOSIS — T84216D Breakdown (mechanical) of internal fixation device of vertebrae, subsequent encounter: Secondary | ICD-10-CM | POA: Diagnosis not present

## 2015-08-12 ENCOUNTER — Other Ambulatory Visit (HOSPITAL_COMMUNITY)
Admission: AD | Admit: 2015-08-12 | Discharge: 2015-08-12 | Disposition: A | Discharge status: Home / Self Care | Payer: Commercial Managed Care - HMO | Source: Other Acute Inpatient Hospital | Attending: Orthopaedic Surgery | Admitting: Orthopaedic Surgery

## 2015-08-12 ENCOUNTER — Telehealth: Payer: Self-pay | Admitting: *Deleted

## 2015-08-12 DIAGNOSIS — M961 Postlaminectomy syndrome, not elsewhere classified: Secondary | ICD-10-CM | POA: Diagnosis not present

## 2015-08-12 DIAGNOSIS — T84216D Breakdown (mechanical) of internal fixation device of vertebrae, subsequent encounter: Secondary | ICD-10-CM | POA: Diagnosis not present

## 2015-08-12 DIAGNOSIS — N390 Urinary tract infection, site not specified: Secondary | ICD-10-CM | POA: Insufficient documentation

## 2015-08-12 LAB — URINALYSIS, ROUTINE W REFLEX MICROSCOPIC
BILIRUBIN URINE: NEGATIVE
Glucose, UA: NEGATIVE mg/dL
Hgb urine dipstick: NEGATIVE
KETONES UR: NEGATIVE mg/dL
Leukocytes, UA: NEGATIVE
Nitrite: NEGATIVE
Protein, ur: NEGATIVE mg/dL
SPECIFIC GRAVITY, URINE: 1.015 (ref 1.005–1.030)
Urobilinogen, UA: 0.2 mg/dL (ref 0.0–1.0)
pH: 7 (ref 5.0–8.0)

## 2015-08-12 MED ORDER — ATORVASTATIN CALCIUM 20 MG PO TABS: 20.0000 mg | ORAL_TABLET | Freq: Every day | ORAL | Status: DC

## 2015-08-12 NOTE — Telephone Encounter (Signed)
Patient called regarding her medication change from 07/26/2015, she was calling to let the office know that she had finish taking Zetia and was ready to switch to the new medication that Dr. Mariea Clonts wanted to start. She also stated that she had a new pharmacy change to make before the medication was ordered. I informed her of the instructions for the Lipitor and she stated that she understood the instructions and will pick up the medication today.

## 2015-08-15 LAB — URINE CULTURE

## 2015-08-16 DIAGNOSIS — M961 Postlaminectomy syndrome, not elsewhere classified: Secondary | ICD-10-CM | POA: Diagnosis not present

## 2015-08-16 DIAGNOSIS — T84216D Breakdown (mechanical) of internal fixation device of vertebrae, subsequent encounter: Secondary | ICD-10-CM | POA: Diagnosis not present

## 2015-08-16 DIAGNOSIS — N390 Urinary tract infection, site not specified: Secondary | ICD-10-CM | POA: Diagnosis not present

## 2015-08-17 ENCOUNTER — Telehealth: Payer: Self-pay | Admitting: *Deleted

## 2015-08-17 DIAGNOSIS — M961 Postlaminectomy syndrome, not elsewhere classified: Secondary | ICD-10-CM | POA: Diagnosis not present

## 2015-08-17 DIAGNOSIS — T84216D Breakdown (mechanical) of internal fixation device of vertebrae, subsequent encounter: Secondary | ICD-10-CM | POA: Diagnosis not present

## 2015-08-17 DIAGNOSIS — N390 Urinary tract infection, site not specified: Secondary | ICD-10-CM | POA: Diagnosis not present

## 2015-08-17 NOTE — Telephone Encounter (Signed)
Patient called and stated that her and her grand daughter has the stomach bug and would like to know what to do. Patient has nausea, but no vomiting, no fever and alittle diarrhea.  Instructed her to eat a bland diet (gave her a list of food to eat), no greasy food and stay hydrated and get rest. Told her if she was no better in 48 hours we needed to see her. She agreed.

## 2015-08-18 ENCOUNTER — Ambulatory Visit (INDEPENDENT_AMBULATORY_CARE_PROVIDER_SITE_OTHER): Payer: Commercial Managed Care - HMO | Admitting: Internal Medicine

## 2015-08-18 ENCOUNTER — Ambulatory Visit: Payer: Self-pay | Admitting: Internal Medicine

## 2015-08-18 VITALS — BP 120/80 | HR 101 | Temp 97.9°F | Resp 22 | Wt 130.8 lb

## 2015-08-18 DIAGNOSIS — F329 Major depressive disorder, single episode, unspecified: Secondary | ICD-10-CM

## 2015-08-18 DIAGNOSIS — R1310 Dysphagia, unspecified: Secondary | ICD-10-CM

## 2015-08-18 DIAGNOSIS — R064 Hyperventilation: Secondary | ICD-10-CM

## 2015-08-18 DIAGNOSIS — R079 Chest pain, unspecified: Secondary | ICD-10-CM | POA: Diagnosis not present

## 2015-08-18 DIAGNOSIS — D62 Acute posthemorrhagic anemia: Secondary | ICD-10-CM | POA: Diagnosis not present

## 2015-08-18 DIAGNOSIS — N390 Urinary tract infection, site not specified: Secondary | ICD-10-CM

## 2015-08-18 DIAGNOSIS — F32A Depression, unspecified: Secondary | ICD-10-CM

## 2015-08-18 DIAGNOSIS — R0600 Dyspnea, unspecified: Secondary | ICD-10-CM | POA: Diagnosis not present

## 2015-08-18 HISTORY — DX: Hyperventilation: R06.4

## 2015-08-18 HISTORY — DX: Dysphagia, unspecified: R13.10

## 2015-08-18 HISTORY — DX: Chest pain, unspecified: R07.9

## 2015-08-18 MED ORDER — OMEPRAZOLE 20 MG PO CPDR: 20.0000 mg | DELAYED_RELEASE_CAPSULE | Freq: Every day | ORAL | Status: DC

## 2015-08-19 ENCOUNTER — Telehealth: Payer: Self-pay | Admitting: *Deleted

## 2015-08-19 DIAGNOSIS — F419 Anxiety disorder, unspecified: Secondary | ICD-10-CM | POA: Insufficient documentation

## 2015-08-19 DIAGNOSIS — R0600 Dyspnea, unspecified: Secondary | ICD-10-CM | POA: Insufficient documentation

## 2015-08-19 DIAGNOSIS — T84498A Other mechanical complication of other internal orthopedic devices, implants and grafts, initial encounter: Secondary | ICD-10-CM | POA: Insufficient documentation

## 2015-08-19 DIAGNOSIS — R1310 Dysphagia, unspecified: Secondary | ICD-10-CM | POA: Insufficient documentation

## 2015-08-19 HISTORY — DX: Dyspnea, unspecified: R06.00

## 2015-08-19 HISTORY — DX: Dysphagia, unspecified: R13.10

## 2015-08-19 LAB — COMPREHENSIVE METABOLIC PANEL
ALT: 9 IU/L (ref 0–32)
AST: 21 IU/L (ref 0–40)
Albumin/Globulin Ratio: 1.4 (ref 1.1–2.5)
Albumin: 4 g/dL (ref 3.5–4.8)
Alkaline Phosphatase: 77 IU/L (ref 39–117)
BUN/Creatinine Ratio: 15 (ref 11–26)
BUN: 9 mg/dL (ref 8–27)
Bilirubin Total: 0.3 mg/dL (ref 0.0–1.2)
CALCIUM: 9.6 mg/dL (ref 8.7–10.3)
CO2: 24 mmol/L (ref 18–29)
Chloride: 99 mmol/L (ref 97–108)
Creatinine, Ser: 0.62 mg/dL (ref 0.57–1.00)
GFR, EST AFRICAN AMERICAN: 101 mL/min/{1.73_m2} (ref >59)
GFR, EST NON AFRICAN AMERICAN: 88 mL/min/{1.73_m2} (ref >59)
Globulin, Total: 2.9 g/dL (ref 1.5–4.5)
Glucose: 105 mg/dL — ABNORMAL HIGH (ref 65–99)
Potassium: 4.3 mmol/L (ref 3.5–5.2)
Sodium: 143 mmol/L (ref 134–144)
TOTAL PROTEIN: 6.9 g/dL (ref 6.0–8.5)

## 2015-08-19 LAB — CBC WITH DIFFERENTIAL
BASOS: 0 %
Basophils Absolute: 0 10*3/uL (ref 0.0–0.2)
EOS (ABSOLUTE): 0 10*3/uL (ref 0.0–0.4)
EOS: 1 %
HEMATOCRIT: 40 % (ref 34.0–46.6)
Hemoglobin: 13.3 g/dL (ref 11.1–15.9)
IMMATURE GRANS (ABS): 0 10*3/uL (ref 0.0–0.1)
Immature Granulocytes: 0 %
LYMPHS: 20 %
Lymphocytes Absolute: 1.4 10*3/uL (ref 0.7–3.1)
MCH: 30.7 pg (ref 26.6–33.0)
MCHC: 33.3 g/dL (ref 31.5–35.7)
MCV: 92 fL (ref 79–97)
Monocytes Absolute: 0.4 10*3/uL (ref 0.1–0.9)
Monocytes: 6 %
NEUTROS ABS: 5 10*3/uL (ref 1.4–7.0)
NEUTROS PCT: 73 %
RBC: 4.33 x10E6/uL (ref 3.77–5.28)
RDW: 13.8 % (ref 12.3–15.4)
WBC: 7 10*3/uL (ref 3.4–10.8)

## 2015-08-19 LAB — URINALYSIS
Bilirubin, UA: NEGATIVE
Glucose, UA: NEGATIVE
Leukocytes, UA: NEGATIVE
NITRITE UA: NEGATIVE
PH UA: 8 — AB (ref 5.0–7.5)
Protein, UA: NEGATIVE
RBC, UA: NEGATIVE
Specific Gravity, UA: 1.018 (ref 1.005–1.030)
Urobilinogen, Ur: 0.2 mg/dL (ref 0.2–1.0)

## 2015-08-19 LAB — TROPONIN I: Troponin I: <0.01 ng/mL (ref 0.00–0.04)

## 2015-08-19 LAB — TSH: TSH: 0.368 u[IU]/mL — ABNORMAL LOW (ref 0.450–4.500)

## 2015-08-19 NOTE — Telephone Encounter (Signed)
Patient notified and agreed. Explained to her that she needed to try to eat and to eat bland diet and gave her examples. She agreed. I tried to explain to her that not eating is probably causing her weakness and encouraged her to eat and drink. I also reviewed her labwork with her.

## 2015-08-19 NOTE — Telephone Encounter (Signed)
See previous note regarding lab reports. If she wants to go to the hospital, I certainly would not stand in the way of that, which she was able to swallow yesterday and should be able to consume nutritional supplements such as Boost or Ensure. She was to have a barium swallow scheduled and this should be able to tell us more about her difficulties with swallowing.

## 2015-08-19 NOTE — Progress Notes (Signed)
Patient ID: Brittany Mays, female   DOB: 05-19-1941, 74 y.o.   MRN: [id number]    Facility  [facility]    Place of Service:   OFFICE    Allergies  Allergen Reactions  . Nsaids Diarrhea and Other (See Comments)    "horrible cramps".  . Sodium Pentobarbital [Pentobarbital] Other (See Comments)    Used as a child in an operation.  MDs told her parents never to let her take this again.  . Codeine Hives and Other (See Comments)    Cramping   . Fentanyl Itching and Other (See Comments)    sweating duragesic patch only; can tolerate IV    Chief Complaint  Patient presents with  . Acute Visit    chest pain, back surgery x 2 weeks ago,    HPI:  Says she has been unable to swallow and eating has been out of the questioin. Some difficulty with breathing. Has had 3 days of substernal chest discomfort. Denies fever. No vomiting.  Medications: Patient's Medications  New Prescriptions   OMEPRAZOLE (PRILOSEC) 20 MG CAPSULE    Take 1 capsule (20 mg total) by mouth daily.  Previous Medications   ATORVASTATIN (LIPITOR) 20 MG TABLET    Take 1 tablet (20 mg total) by mouth daily.   CALCIUM CARBONATE (OS-CAL - DOSED IN MG OF ELEMENTAL CALCIUM) 1250 (500 CA) MG TABLET    Take by mouth.   CALCIUM-VITAMIN D (OSCAL WITH D) 500-200 MG-UNIT PER TABLET    Take 2 tablets by mouth daily with breakfast.    CYANOCOBALAMIN 100 MCG TABLET    Take 100 mcg by mouth daily.   DULOXETINE (CYMBALTA) 30 MG CAPSULE    Take one tablet by mouth once daily for depression   FISH OIL-OMEGA-3 FATTY ACIDS 1000 MG CAPSULE    Take 1 g by mouth daily.    GABAPENTIN (NEURONTIN) 300 MG CAPSULE    Take 300 mg by mouth. 1 by mouth twice daily   MELATONIN 3 MG CAPS    Take 1 capsule (3 mg total) by mouth at bedtime as needed (insomnia).   METHOCARBAMOL (ROBAXIN) 750 MG TABLET    Take 750 mg by mouth. 1 by mouth twice daily   MULTIPLE VITAMINS-MINERALS (WOMENS MULTI) CAPS    Take one tablet once daily   OXYCODONE (ROXICODONE) 15 MG  IMMEDIATE RELEASE TABLET    TK 1 T PO Q 4 H PRN P   OXYCODONE HCL 20 MG TABS       OXYCODONE-ACETAMINOPHEN (PERCOCET) 10-325 MG PER TABLET    Take 1 tablet by mouth 2 (two) times daily as needed.    VITAMIN B-12 (CYANOCOBALAMIN) 100 MCG TABLET       ZOSTER VACCINE LIVE, PF, (ZOSTAVAX) 13086 UNT/0.65ML INJECTION    Inject 19,400 Units into the skin once.  Modified Medications   No medications on file  Discontinued Medications   No medications on file    Review of Systems  Constitutional: Negative for fever, chills, diaphoresis, activity change, appetite change, fatigue and unexpected weight change.  HENT: Negative for congestion, ear discharge, ear pain, hearing loss, postnasal drip, rhinorrhea, sore throat, tinnitus, trouble swallowing and voice change.   Eyes: Negative for pain, redness, itching and visual disturbance.  Respiratory: Negative for cough, choking, shortness of breath and wheezing.   Cardiovascular: Positive for chest pain (Atypical pains unlikely be angina). Negative for palpitations and leg swelling.  Gastrointestinal: Negative for nausea, abdominal pain, diarrhea, constipation and abdominal distention.  Endocrine:  Negative for cold intolerance, heat intolerance, polydipsia, polyphagia and polyuria.  Genitourinary: Negative for dysuria, urgency, frequency, hematuria, flank pain, vaginal discharge, difficulty urinating and pelvic pain.  Musculoskeletal: Negative for myalgias, back pain, arthralgias, gait problem, neck pain and neck stiffness.  Skin: Positive for pallor. Negative for color change and rash.  Allergic/Immunologic: Negative.   Neurological: Negative for dizziness, tremors, seizures, syncope, weakness, numbness and headaches.  Hematological: Negative for adenopathy. Does not bruise/bleed easily.  Psychiatric/Behavioral: Positive for dysphoric mood and agitation. Negative for suicidal ideas, hallucinations, behavioral problems, confusion and sleep disturbance. The  patient is nervous/anxious. The patient is not hyperactive.        Hyperventilating. Panic type reaction.    There were no vitals filed for this visit. There is no weight on file to calculate BMI.  Physical Exam  Constitutional: She is oriented to person, place, and time. She appears well-developed and well-nourished. No distress.  HENT:  Right Ear: External ear normal.  Left Ear: External ear normal.  Nose: Nose normal.  Mouth/Throat: Oropharynx is clear and moist. No oropharyngeal exudate.  Eyes: Conjunctivae and EOM are normal. Pupils are equal, round, and reactive to light. No scleral icterus.  Neck: No JVD present. No tracheal deviation present. No thyromegaly present.  Cardiovascular: Normal rate, regular rhythm, normal heart sounds and intact distal pulses.  Exam reveals no gallop and no friction rub.   No murmur heard. Sinus tachycardia  Pulmonary/Chest: Effort normal. No respiratory distress. She has no wheezes. She has rales. She exhibits no tenderness.  Hyperventilating  Abdominal: She exhibits no distension and no mass. There is no tenderness.  Despite her statements and she cannot swallow, she was able swallow 3 ounces of water.  Musculoskeletal: Normal range of motion. She exhibits no edema or tenderness.  Lymphadenopathy:    She has no cervical adenopathy.  Neurological: She is alert and oriented to person, place, and time. No cranial nerve deficit. Coordination normal.  Skin: No rash noted. She is not diaphoretic. No erythema. There is pallor.  Wound of the back does not appear infected  Psychiatric: Judgment and thought content normal.  Excitable, agitated, panicked. She was able to calm down eventually.    Labs reviewed: Lab Summary Latest Ref Rng 08/18/2015 07/27/2015 12/15/2014  Hemoglobin 11.1 - 15.9 g/dL 13.3 (None) 13.6  Hematocrit 34.0 - 46.6 % 40.0 (None) 40.3  White count 3.4 - 10.8 x10E3/uL 7.0 (None) 5.2  Platelet count - (None) (None) (None)  Sodium  134 - 144 mmol/L 143 145(H) 144  Potassium 3.5 - 5.2 mmol/L 4.3 4.7 5.0  Calcium 8.7 - 10.3 mg/dL 9.6 9.5 9.6  Phosphorus - (None) (None) (None)  Creatinine 0.57 - 1.00 mg/dL 0.62 0.76 0.81  AST 0 - 40 IU/L 21 (None) 20  Alk Phos 39 - 117 IU/L 77 (None) 67  Bilirubin 0.0 - 1.2 mg/dL 0.3 (None) 0.3  Glucose 65 - 99 mg/dL 105(H) 95 99  Cholesterol - (None) (None) (None)  HDL cholesterol >39 mg/dL (None) 42 59  Triglycerides 0 - 149 mg/dL (None) 198(H) 205(H)  LDL Direct - (None) (None) (None)  LDL Calc 0 - 99 mg/dL (None) 117(H) 96  Total protein - (None) (None) (None)  Albumin 3.5 - 4.8 g/dL 4.0 (None) 4.4   Lab Results  Component Value Date   TSH 0.368* 08/18/2015   Lab Results  Component Value Date   BUN 9 08/18/2015   Lab Results  Component Value Date   HGBA1C 5.9* 07/27/2015  Assessment/Plan  1. Chest pain, unspecified chest pain type - Comprehensive metabolic panel - TSH - Troponin I - omeprazole (PRILOSEC) 20 MG capsule; Take 1 capsule (20 mg total) by mouth daily.  Dispense: 14 capsule; Refill: 3  2. Acute blood loss anemia - CBC With Differential  3. UTI (lower urinary tract infection) - Urinalysis - Urine culture  4. Depression Continue current medication  5. Hyperventilation I believe secondary to her panicked feelings because of difficulty with breathing and swallowing  6. Odynophagia - DG Esophagus; Future  7. Dyspnea Likely related to hyperventilation syndrome  8. Dysphagia Possible esophageal spasm. Barium swallow was recommended.

## 2015-08-19 NOTE — Telephone Encounter (Signed)
Patient called and stated that she just doesn't understand why she just can't go to the hospital. States that she is more shaky and weak. She hasn't been able to eat in a week and just feels bad. Would like to know what to do and would like to know the results of her bloodwork and U/A. Please Advise.

## 2015-08-20 ENCOUNTER — Encounter: Payer: Self-pay | Admitting: Internal Medicine

## 2015-08-20 DIAGNOSIS — T84216D Breakdown (mechanical) of internal fixation device of vertebrae, subsequent encounter: Secondary | ICD-10-CM | POA: Diagnosis not present

## 2015-08-20 DIAGNOSIS — M961 Postlaminectomy syndrome, not elsewhere classified: Secondary | ICD-10-CM | POA: Diagnosis not present

## 2015-08-20 DIAGNOSIS — N390 Urinary tract infection, site not specified: Secondary | ICD-10-CM | POA: Diagnosis not present

## 2015-08-20 LAB — URINE CULTURE: ORGANISM ID, BACTERIA: NO GROWTH

## 2015-08-20 NOTE — Telephone Encounter (Signed)
Patient is calling back today stating that she tries to eat alittle and it is not that she can't swallow it, but when she eats she gets sick on her stomach and all she does is sleep and sleep. Stated that " I can't go on like this"  Did have a decent bowel movement this morning. Just feeling pressure in stomach. Also wants to know results of Urine culture.

## 2015-08-20 NOTE — Telephone Encounter (Signed)
I don't have anything different to offer at this time. Was her barium swallow scheduled? She needs to have this done.

## 2015-08-20 NOTE — Telephone Encounter (Signed)
Patient notified and will follow up in 1 week.

## 2015-08-23 ENCOUNTER — Telehealth: Payer: Self-pay | Admitting: *Deleted

## 2015-08-23 DIAGNOSIS — M961 Postlaminectomy syndrome, not elsewhere classified: Secondary | ICD-10-CM | POA: Diagnosis not present

## 2015-08-23 DIAGNOSIS — T84216D Breakdown (mechanical) of internal fixation device of vertebrae, subsequent encounter: Secondary | ICD-10-CM | POA: Diagnosis not present

## 2015-08-23 DIAGNOSIS — N390 Urinary tract infection, site not specified: Secondary | ICD-10-CM | POA: Diagnosis not present

## 2015-08-23 NOTE — Telephone Encounter (Signed)
Agree with UA c+s.  She also had surgery, didn't she?  Does her wound look ok?

## 2015-08-23 NOTE — Telephone Encounter (Signed)
Patient called and stated that [facility] came out today and stated that patient is running a low grade fever of 99.2-99.8 and would like to do a U/A and Culture. Some lower abdominal discomfort. Patient would like to have done. Needs order faxed to Fax#: [phone] Please Advise.

## 2015-08-24 ENCOUNTER — Other Ambulatory Visit (HOSPITAL_COMMUNITY)
Admission: RE | Admit: 2015-08-24 | Discharge: 2015-08-24 | Disposition: A | Discharge status: Home / Self Care | Payer: Commercial Managed Care - HMO | Source: Other Acute Inpatient Hospital | Attending: Orthopaedic Surgery | Admitting: Orthopaedic Surgery

## 2015-08-24 DIAGNOSIS — N390 Urinary tract infection, site not specified: Secondary | ICD-10-CM | POA: Diagnosis not present

## 2015-08-24 DIAGNOSIS — T84216D Breakdown (mechanical) of internal fixation device of vertebrae, subsequent encounter: Secondary | ICD-10-CM | POA: Diagnosis not present

## 2015-08-24 DIAGNOSIS — M961 Postlaminectomy syndrome, not elsewhere classified: Secondary | ICD-10-CM | POA: Diagnosis not present

## 2015-08-24 LAB — URINALYSIS, ROUTINE W REFLEX MICROSCOPIC
BILIRUBIN URINE: NEGATIVE
Glucose, UA: NEGATIVE mg/dL
HGB URINE DIPSTICK: NEGATIVE
Ketones, ur: NEGATIVE mg/dL
Leukocytes, UA: NEGATIVE
Nitrite: NEGATIVE
PROTEIN: NEGATIVE mg/dL
SPECIFIC GRAVITY, URINE: 1.01 (ref 1.005–1.030)
Urobilinogen, UA: 0.2 mg/dL (ref 0.0–1.0)
pH: 7 (ref 5.0–8.0)

## 2015-08-24 NOTE — Telephone Encounter (Signed)
Faxed to [facility] Fax: [phone] and patient notified. She Stated wound looks good.

## 2015-08-25 ENCOUNTER — Ambulatory Visit: Payer: Commercial Managed Care - HMO | Admitting: Internal Medicine

## 2015-08-25 ENCOUNTER — Telehealth: Payer: Self-pay | Admitting: *Deleted

## 2015-08-25 NOTE — Telephone Encounter (Signed)
Patient called regarding the barium swallow test that Dr. Nyoka Cowden had recommended, she stated that she didn't think that she could do this at this time and wanted to know more about it. She stated that she will call [facility] to ask question about the process that she will have to go though since she just had back surgery. She all so stated that she had ran out of the Prilosec and needed a refill, informed her that she had 3 refills on the script [employer]) she will check with the pharmacy.

## 2015-08-26 DIAGNOSIS — M961 Postlaminectomy syndrome, not elsewhere classified: Secondary | ICD-10-CM | POA: Diagnosis not present

## 2015-08-26 DIAGNOSIS — N390 Urinary tract infection, site not specified: Secondary | ICD-10-CM | POA: Diagnosis not present

## 2015-08-26 DIAGNOSIS — T84216D Breakdown (mechanical) of internal fixation device of vertebrae, subsequent encounter: Secondary | ICD-10-CM | POA: Diagnosis not present

## 2015-08-26 LAB — URINE CULTURE

## 2015-08-27 ENCOUNTER — Telehealth: Payer: Self-pay | Admitting: *Deleted

## 2015-08-27 NOTE — Telephone Encounter (Signed)
Patient called and stated that she is constipated and it has been 3 days since she has had a bowel movement. Stated that she knows she doesn't have a blockage. Patient stated that she is taking Prilosec and Probiotic. Patient stated that she was very uncomfortable. I instructed her that if she was in pain and that uncomfortable she needed to go to Urgent Care. She refused. Please Advise.

## 2015-08-30 NOTE — Telephone Encounter (Signed)
Called patient to let her know we making sure with Dr. Mariea Clonts of medication since patient wanted Dr. Mariea Clonts to address concern and patient got very agitated and stated that she never said that. Patient started yelling on the phone and then hung up on me.

## 2015-08-30 NOTE — Telephone Encounter (Signed)
She could try a bottle of magnesium citrate (10 oz). Ask dr. Mariea Clonts if this is OK with her.

## 2015-08-30 NOTE — Telephone Encounter (Signed)
recommend Mag citrate. This can wait on Dr Mariea Clonts since pt prefers an answer from her

## 2015-08-30 NOTE — Telephone Encounter (Signed)
Patient is calling again this morning regarding her constipation and would like to know what she can do. She is uncomfortable. I suggested her to go to Urgent care to be evaluated today but she refused and upset that she hasn't gotten an answer and would like one, and wants her Primary Dr to address instead of Dr. Nyoka Cowden now. Please Advise.

## 2015-09-01 ENCOUNTER — Telehealth: Payer: Self-pay | Admitting: Internal Medicine

## 2015-09-01 ENCOUNTER — Ambulatory Visit: Payer: Commercial Managed Care - HMO | Admitting: Internal Medicine

## 2015-09-01 DIAGNOSIS — M961 Postlaminectomy syndrome, not elsewhere classified: Secondary | ICD-10-CM | POA: Diagnosis not present

## 2015-09-01 DIAGNOSIS — T84216D Breakdown (mechanical) of internal fixation device of vertebrae, subsequent encounter: Secondary | ICD-10-CM | POA: Diagnosis not present

## 2015-09-01 DIAGNOSIS — N390 Urinary tract infection, site not specified: Secondary | ICD-10-CM | POA: Diagnosis not present

## 2015-09-01 NOTE — Telephone Encounter (Signed)
Does she still want a phone call about the swallowing?  Sounds like the constipation is resolved.  I also was under the impression that both she and her husband were switching primary care to someone nearby to them in [place].

## 2015-09-01 NOTE — Telephone Encounter (Signed)
Brittany Mays called the office today, very upset.  She has been having problems with her stomach and would like to speak with her PCP, Dr. Mariea Clonts. Told Mrs. Hepworth I would discuss her concerns with Dr. Mariea Clonts.  I will also make sure Dr. Mariea Clonts  Calls her soon.  Patient says she was recommended by  Dr. Nyoka Cowden to have a Barium Swallow.Marland KitchenMarland KitchenSays she can not. She just had back surgery and can not do this test.  Pt wants to talk with Dr. Mariea Clonts about the test as well... fyi to Dr. Mariea Clonts..Pt says her stomach is better. She did not think she would need the Mag...cdavis

## 2015-09-02 NOTE — Telephone Encounter (Signed)
Spoke with Brittany Mays.  She was upset that she was not given specific instruction on how to take her prilosec or mylanta and that staff were unaware that she could not do a barium swallow when she has just had back surgery and cannot lie on her stomach.  I asked her to retell her story.    Initially, she was seen by Dr. Nyoka Cowden due to midsternal chest pain.  Cardiac was ruled out. She tells me he thought maybe she was having a panic attack, but she was treated more for GI with prilosec and mylanta and the barium swallow recommendation due to poor intake.  She says she has not actually had difficulty swallowing. She could not take deep breaths at the time.  She also was constipated and had been even before hospital discharge (had needed a suppository).  She has still not been eating well and was not before surgery either.  She did eat in the hospital and has been trying to eat since returning home--now taking protein shakes and soft foods like applesauce and yogurt and then a full meal at supper.  (for the past three days).  She is no longer having the chest pressure.  She is now having daily soft and small bms and is taking probiotics and miralax off and on.  She notes she had a blowout last week.  She continues with quite a bit of flatus.  She is taking oxycodone IR 20mg  one in the day and one at night, no other narcotics, no muscle relaxers, no gabapentin and is working on walking more.  We spoke for 16 mins.  I counseled her to continue her probiotics and miralax.  Drink plenty of fluids and continue to walk as she can tolerate.  Explained that her bowels were affected by abx for UTI, pain meds, immobility, poor po intake, and possibly an obstruction or impaction last week. Also explained that her soft diet will alter her normal bms as will the shakes.  She expressed understanding and will see me on 11/3 for an appointment.

## 2015-09-02 NOTE — Telephone Encounter (Signed)
Done.  See addendum to [facility] phone note.

## 2015-09-02 NOTE — Telephone Encounter (Signed)
Patient called requesting that a reminder be given to Dr.Reed that she is to call her personally. Note printed off and placed on ledge as an additional reminder

## 2015-09-03 ENCOUNTER — Telehealth: Payer: Self-pay

## 2015-09-03 DIAGNOSIS — M5415 Radiculopathy, thoracolumbar region: Secondary | ICD-10-CM | POA: Diagnosis not present

## 2015-09-03 DIAGNOSIS — M4326 Fusion of spine, lumbar region: Secondary | ICD-10-CM | POA: Diagnosis not present

## 2015-09-03 NOTE — Telephone Encounter (Signed)
Patient called asking to talk with Dr. Mariea Clonts this morning, she was expecting her call, she spoke with her last night. Dr. Mariea Clonts unable to come to the phone, asked patient her problems, not having BM, when asked last BM, "well I have BM only teaspoon ", having so much discomfort in stomach. Patient started crying. I told her I would ask what Dr Mariea Clonts recommends

## 2015-09-03 NOTE — Telephone Encounter (Signed)
Spoke with Dr. Mariea Clonts -called patient back, she said to take one dose of magnesium citrate. Patient said she would.

## 2015-09-06 ENCOUNTER — Telehealth: Payer: Self-pay | Admitting: *Deleted

## 2015-09-06 DIAGNOSIS — K5900 Constipation, unspecified: Secondary | ICD-10-CM

## 2015-09-06 NOTE — Telephone Encounter (Signed)
Patient notified and agreed. Order placed. 

## 2015-09-06 NOTE — Telephone Encounter (Signed)
Patient called and stated that her Surgeon prescribed Movantik on Friday for constipation. She did not try the magnesium citrate like you suggested.  She took it but since taking she has been passing  nothing but large amounts of liquid. She wants to know if she should keep taking. Please Advise.

## 2015-09-06 NOTE — Telephone Encounter (Signed)
Let's have her go for a KUB (abdominal xay).

## 2015-09-07 ENCOUNTER — Encounter: Payer: Self-pay | Admitting: Family Medicine

## 2015-09-07 ENCOUNTER — Ambulatory Visit (INDEPENDENT_AMBULATORY_CARE_PROVIDER_SITE_OTHER): Payer: Commercial Managed Care - HMO | Admitting: Family Medicine

## 2015-09-07 ENCOUNTER — Ambulatory Visit
Admission: RE | Admit: 2015-09-07 | Discharge: 2015-09-07 | Disposition: A | Discharge status: Home / Self Care | Payer: Commercial Managed Care - HMO | Source: Ambulatory Visit | Attending: Family Medicine | Admitting: Family Medicine

## 2015-09-07 ENCOUNTER — Telehealth: Payer: Self-pay | Admitting: Family Medicine

## 2015-09-07 VITALS — BP 126/88 | HR 87 | Temp 98.0°F | Ht 62.0 in | Wt 128.2 lb

## 2015-09-07 DIAGNOSIS — K5900 Constipation, unspecified: Secondary | ICD-10-CM

## 2015-09-07 DIAGNOSIS — R634 Abnormal weight loss: Secondary | ICD-10-CM | POA: Diagnosis not present

## 2015-09-07 DIAGNOSIS — Z1231 Encounter for screening mammogram for malignant neoplasm of breast: Secondary | ICD-10-CM | POA: Diagnosis not present

## 2015-09-07 DIAGNOSIS — R109 Unspecified abdominal pain: Secondary | ICD-10-CM | POA: Diagnosis not present

## 2015-09-07 LAB — T3, FREE: T3 FREE: 3.1 pg/mL (ref 2.3–4.2)

## 2015-09-07 LAB — CBC WITH DIFFERENTIAL/PLATELET
BASOS PCT: 0.8 % (ref 0.0–3.0)
Basophils Absolute: 0.1 10*3/uL (ref 0.0–0.1)
Eosinophils Absolute: 0.2 10*3/uL (ref 0.0–0.7)
Eosinophils Relative: 3.3 % (ref 0.0–5.0)
HCT: 44 % (ref 36.0–46.0)
HEMOGLOBIN: 14.4 g/dL (ref 12.0–15.0)
Lymphocytes Relative: 28.6 % (ref 12.0–46.0)
Lymphs Abs: 2 10*3/uL (ref 0.7–4.0)
MCHC: 32.8 g/dL (ref 30.0–36.0)
MCV: 91.9 fl (ref 78.0–100.0)
Monocytes Absolute: 0.6 10*3/uL (ref 0.1–1.0)
Monocytes Relative: 7.9 % (ref 3.0–12.0)
NEUTROS PCT: 59.4 % (ref 43.0–77.0)
Neutro Abs: 4.2 10*3/uL (ref 1.4–7.7)
PLATELETS: 168 10*3/uL (ref 150.0–400.0)
RBC: 4.79 Mil/uL (ref 3.87–5.11)
RDW: 14.5 % (ref 11.5–15.5)
WBC: 7 10*3/uL (ref 4.0–10.5)

## 2015-09-07 LAB — T4, FREE: FREE T4: 0.77 ng/dL (ref 0.60–1.60)

## 2015-09-07 LAB — C-REACTIVE PROTEIN: CRP: 0.1 mg/dL — ABNORMAL LOW (ref 0.5–20.0)

## 2015-09-07 LAB — SEDIMENTATION RATE: Sed Rate: 21 mm/hr (ref 0–22)

## 2015-09-07 NOTE — Telephone Encounter (Signed)
Attempted to call patient regarding abdominal x-ray results. There is no answer and I left a message asking her to call back to the office.  When the patient calls back please inform her that the abdominal x-ray did not show increased stool burden. There is no sign of obstruction either. They recommended that we get a CT abdomen and pelvis which we are already doing to evaluate this further. I would favor awaiting the results of the CT abdomen and pelvis prior to treating her for constipation. We will await her call back.

## 2015-09-07 NOTE — Telephone Encounter (Signed)
Pt called returning your call. Thank You!

## 2015-09-07 NOTE — Patient Instructions (Addendum)
Nice to meet you. We will obtain imaging to evaluate for a cause of your weight loss.  We will also obtain lab work.  Please go get the x-ray of your abdomen to evaluate your constipation.  If you develop fever, abdominal pain, nausea, vomiting, diarrhea, blood in her stool, cough, chest pain, shortness of breath, any new or different symptoms please seek medical attention immediately.

## 2015-09-07 NOTE — Progress Notes (Signed)
Patient ID: Brittany Mays, female   DOB: 11/18/1940, 74 y.o.   MRN: [id number]  Brittany Rumps, MD Phone: [phone]  Brittany Mays is a 74 y.o. female who presents today for new patient visit.  Constipation: Patient reports for the last month she has had decreased stool output. She notes she had a lumbar spine fusion about a month ago and had constipation following that. She notes she was given a Dulcolax suppository in the hospital and this helped her have a normal bowel movement. Since then she's only had small amounts of bowel movements. She notes having a bowel movement about once a week that is small in size. She is passing gas on a daily basis. She denies any vomiting. She does note some nausea. She does note some abdominal discomfort and fullness with this, though she states this is not abdominal pain. She has had her appendix removed. She's had a total hysterectomy and bilateral salpingo-oophorectomy in the past. She has had mild indigestion with this though this resolved with Prilosec. She takes oxycodone 10 mg twice a day at this time. She notes she has taken more of this at higher doses in the past. She states she has taken movantik for her constipation and this is given to her by her surgeon's office and this caused a large bowel movement. She is also been taking MiraLAX for this though uses this infrequently. No fevers with this.  Weight loss: Patient notes she has lost about 40 pounds without trying in the last 3 months. She notes she lost weight prior to her surgery, though the weight loss has increased since her surgery. She notes she has been eating normally. She denies any night sweats. She denies any history of thyroid issues. She has had a parathyroidectomy in the past. She does not have her uterus or ovaries. She's not had a mammogram in some time. No family history of any cancer. No blood in her stools. Her last colonoscopy was 2 years ago per her report and per her report they  found a couple of polyps. She did smoke 1 pack per day for 50 years. He quit 3 weeks ago. She has noted some anxiety with regards to this excessive weight loss. This comes and goes. No depression at this time. She does report a prior lumpectomy that was found to be benign.  Active Ambulatory Problems    Diagnosis Date Noted  . Depression   . Obesity (BMI 30-39.9) 06/23/2013  . Lumbar spinal stenosis   . Insomnia   . Difficulty in walking(719.7) 02/10/2014  . Decreased muscle strength 02/10/2014  . Acute encephalopathy 04/28/2014  . S/P lumbar fusion 04/28/2014  . UTI (lower urinary tract infection) 04/28/2014  . Intractable back pain 04/29/2014  . Candidal dermatitis 05/14/2014  . Muscle spasm of back 06/02/2014  . Groin rash 06/02/2014  . Generalized weakness 06/02/2014  . Acute blood loss anemia 06/02/2014  . Chest pain 08/18/2015  . Hyperventilation 08/18/2015  . Odynophagia 08/18/2015  . Anxiety 08/19/2015  . Arthritis 08/19/2015  . Mechanical complication of internal fixation device such as nail, plate or rod ([place]) 45/85/9292  . 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 Dyspnea 08/19/2015  . Dysphagia 08/19/2015  . Unintentional weight loss 09/08/2015  . Constipation 09/08/2015   Resolved Ambulatory Problems    Diagnosis Date Noted  . No Resolved Ambulatory Problems   Past Medical History  Diagnosis Date  . Pain   . Cataract   . Osteoarthritis of  both knees   . Hyperparathyroidism, primary ([place])   . Rectal prolapse   . Kidney stone     Family History  Problem Relation Age of Onset  . COPD Father   . Heart failure Father   . Diabetes Son   . Mental illness Mother     Social History   Social History  . Marital Status: Married    Spouse Name: N/A  . Number of Children: N/A  . Years of Education: N/A   Occupational History  . Not on file.   Social History Main Topics  . Smoking status: Former Smoker -- 1.00 packs/day for 50 years     Types: Cigarettes    Quit date: 08/17/2015  . Smokeless tobacco: Not on file  . Alcohol Use: No  . Drug Use: No  . Sexual Activity: Not Currently   Other Topics Concern  . Not on file   Social History Narrative    ROS   General:  Positive for unexplained weight loss, Negative for fever Skin: Negative for new or changing mole, sore that won't heal HEENT: Negative for trouble hearing, trouble seeing, ringing in ears, mouth sores, hoarseness, change in voice, dysphagia. CV:  Negative for chest pain, dyspnea, edema, palpitations Resp: Negative for cough, dyspnea, hemoptysis GI: Positive for abdominal discomfort, nausea, constipation, Negative for vomiting, diarrhea, abdominal pain (states she does not have abdominal pain), melena, hematochezia. GU: Negative for dysuria, incontinence, urinary hesitance, hematuria, vaginal or penile discharge, polyuria, sexual difficulty, lumps in testicle or breasts MSK: Negative for muscle cramps or aches, joint pain or swelling Neuro: Negative for headaches, weakness, numbness, dizziness, passing out/fainting Psych: Positive for anxiety, Negative for depression, memory problems  Objective  Physical Exam Filed Vitals:   09/07/15 0853  BP: 126/88  Pulse: 87  Temp: 98 F (36.7 C)    BP Readings from Last 3 Encounters:  09/07/15 126/88  08/18/15 120/80  01/14/15 110/76   Wt Readings from Last 3 Encounters:  09/07/15 128 lb 3.2 oz (58.151 kg)  08/18/15 130 lb 12.8 oz (59.33 kg)  01/14/15 154 lb 9.6 oz (70.126 kg)    Physical Exam  Constitutional: She is well-developed, well-nourished, and in no distress.  She does not appear malnourished or underweight  HENT:  Head: Normocephalic and atraumatic.  Right Ear: External ear normal.  Left Ear: External ear normal.  Mouth/Throat: Oropharynx is clear and moist. No oropharyngeal exudate.  Eyes: Conjunctivae are normal. Pupils are equal, round, and reactive to light.  Neck: Neck supple. No  thyromegaly present.  Cardiovascular: Normal rate, regular rhythm and normal heart sounds.  Exam reveals no gallop and no friction rub.   No murmur heard. Pulmonary/Chest: Effort normal and breath sounds normal. No respiratory distress. She has no wheezes. She has no rales.  Abdominal: Soft. Bowel sounds are normal. She exhibits no distension and no mass. There is no tenderness. There is no rebound and no guarding.  Genitourinary: Vagina normal.  Normal external labia, normal vaginal mucosa, no cervix appreciated in a patient status post total hysterectomy, no tenderness on bimanual exam, no masses felt on bimanual exam  Musculoskeletal: She exhibits no edema.  Lymphadenopathy:    She has no cervical adenopathy.  Neurological: She is alert. Gait normal.  Skin: Skin is warm and dry. She is not diaphoretic.  Psychiatric:  She appeared anxious early on in the encounter, though much less anxious towards the end of the encounter     Assessment/Plan:   Unintentional weight  loss Patient with loss of about 40 pounds in the last 2-3 months. Has not had a mammogram in many decades. Notes a long history of smoking. Does have some abdominal discomfort and reported constipation. A colonoscopy reportedly 2 years ago that had several polyps and on review of Epic appears that this is just entered and as "normal". Given smoking history and abdominal discomfort with some constipation we will obtain CT chest, abdomen, and pelvis with contrast. She had recent renal function checked. We will additionally check an HIV, hepatitis C, sedimentation rate, CRP, CBC with differential, T3 and T4 (patient's TSH recently was mildly low). She had recent  CMP with normal renal function and liver function. Will order mammogram as well. She is given return precautions.   Constipation Patient with about a month of recent constipation. Discomfort sensation is likely related to the constipation. She denies that she has any  abdominal pain. She had a benign pelvic exam today. She has benign abdominal exam today. We will check a abdominal plain film to evaluate for level of constipation and for obstruction. She will use MiraLAX daily to see if this will benefit her. CT of her abdomen and pelvis will also be done which could shed some light on to a cause for this as well. He was given return precautions.    Orders Placed This Encounter  Procedures  . CT Chest W Contrast    Standing Status: Future     Number of Occurrences:      Standing Expiration Date: 11/06/2016    Order Specific Question:  If indicated for the ordered procedure, I authorize the administration of contrast media per Radiology protocol    Answer:  Yes    Order Specific Question:  Reason for Exam (SYMPTOM  OR DIAGNOSIS REQUIRED)    Answer:  unintentional weight loss of 40 lbs with abdominal discomfort and constipation    Order Specific Question:  Preferred imaging location?    Answer:  [facility]  . CT Abdomen Pelvis W Contrast    Standing Status: Future     Number of Occurrences:      Standing Expiration Date: 12/07/2016    Order Specific Question:  If indicated for the ordered procedure, I authorize the administration of contrast media per Radiology protocol    Answer:  Yes    Order Specific Question:  Reason for Exam (SYMPTOM  OR DIAGNOSIS REQUIRED)    Answer:  unintentional weight loss of 40 lbs with abdominal discomfort and constipation    Order Specific Question:  Preferred imaging location?    Answer:  [place] [facility]    Standing Status: Future     Number of Occurrences: 1     Standing Expiration Date: 11/06/2016    Order Specific Question:  Reason for Exam (SYMPTOM  OR DIAGNOSIS REQUIRED)    Answer:  unintentional weight loss of 40 lbs with abdominal discomfort and constipation    Order Specific Question:  Preferred imaging location?    Answer:  [facility]    Standing  Status: Future     Number of Occurrences:      Standing Expiration Date: 12/08/2016    Order Specific Question:  Reason for Exam (SYMPTOM  OR DIAGNOSIS REQUIRED)    Answer:  Screening mammogram    Order Specific Question:  Preferred imaging location?    Answer:  [facility]  . HIV antibody (with reflex)  . Hepatitis C  Antibody  . Sed Rate (ESR)  . C-reactive protein  . CBC w/Diff  . T3, free  . T4, free    Brittany Mays

## 2015-09-07 NOTE — Progress Notes (Signed)
Pre visit review using our clinic review tool, if applicable. No additional management support is needed unless otherwise documented below in the visit note. 

## 2015-09-08 ENCOUNTER — Encounter: Payer: Self-pay | Admitting: Family Medicine

## 2015-09-08 ENCOUNTER — Ambulatory Visit: Payer: Commercial Managed Care - HMO | Admitting: Internal Medicine

## 2015-09-08 DIAGNOSIS — R634 Abnormal weight loss: Secondary | ICD-10-CM

## 2015-09-08 HISTORY — DX: Abnormal weight loss: R63.4

## 2015-09-08 NOTE — Assessment & Plan Note (Signed)
Patient with about a month of recent constipation. Discomfort sensation is likely related to the constipation. She denies that she has any abdominal pain. She had a benign pelvic exam today. She has benign abdominal exam today. We will check a abdominal plain film to evaluate for level of constipation and for obstruction. She will use MiraLAX daily to see if this will benefit her. CT of her abdomen and pelvis will also be done which could shed some light on to a cause for this as well. He was given return precautions.

## 2015-09-08 NOTE — Assessment & Plan Note (Addendum)
Patient with loss of about 40 pounds in the last 2-3 months. Has not had a mammogram in many decades. Notes a long history of smoking. Does have some abdominal discomfort and reported constipation. A colonoscopy reportedly 2 years ago that had several polyps and on review of Epic appears that this is just entered and as "normal". Given smoking history and abdominal discomfort with some constipation we will obtain CT chest, abdomen, and pelvis with contrast. She had recent renal function checked. We will additionally check an HIV, hepatitis C, sedimentation rate, CRP, CBC with differential, T3 and T4 (patient's TSH recently was mildly low). She had recent  CMP with normal renal function and liver function. Will order mammogram as well. She is given return precautions.

## 2015-09-08 NOTE — Telephone Encounter (Signed)
Spoke with patient and gave results from X ray. She stated that she has had a couple small soft bowel movements since yesterday. I told her that once we get the results from the CT scan we will give her a call back.

## 2015-09-08 NOTE — Telephone Encounter (Signed)
Noted  

## 2015-09-09 ENCOUNTER — Ambulatory Visit: Payer: Commercial Managed Care - HMO | Admitting: Internal Medicine

## 2015-09-09 ENCOUNTER — Telehealth: Payer: Self-pay | Admitting: Family Medicine

## 2015-09-09 LAB — HIV ANTIBODY (ROUTINE TESTING W REFLEX): HIV: NONREACTIVE

## 2015-09-09 NOTE — Telephone Encounter (Signed)
Notified patient that all lab work was normal. Awaiting HIV and Hep C results.

## 2015-09-09 NOTE — Telephone Encounter (Signed)
Please let the patient know that the labs that have resulted at this time are normal. We are still awaiting her HIV and hep c testing. Thanks.

## 2015-09-09 NOTE — Telephone Encounter (Signed)
Pt called wanting to know if her lab results are in? Thank You!

## 2015-09-10 ENCOUNTER — Ambulatory Visit
Admission: RE | Admit: 2015-09-10 | Discharge: 2015-09-10 | Disposition: A | Discharge status: Home / Self Care | Payer: Commercial Managed Care - HMO | Source: Ambulatory Visit | Attending: Family Medicine | Admitting: Family Medicine

## 2015-09-10 ENCOUNTER — Telehealth: Payer: Self-pay | Admitting: Family Medicine

## 2015-09-10 DIAGNOSIS — R634 Abnormal weight loss: Secondary | ICD-10-CM | POA: Diagnosis present

## 2015-09-10 DIAGNOSIS — R911 Solitary pulmonary nodule: Secondary | ICD-10-CM | POA: Diagnosis not present

## 2015-09-10 DIAGNOSIS — Z981 Arthrodesis status: Secondary | ICD-10-CM | POA: Insufficient documentation

## 2015-09-10 DIAGNOSIS — Z9049 Acquired absence of other specified parts of digestive tract: Secondary | ICD-10-CM | POA: Insufficient documentation

## 2015-09-10 DIAGNOSIS — K5730 Diverticulosis of large intestine without perforation or abscess without bleeding: Secondary | ICD-10-CM | POA: Insufficient documentation

## 2015-09-10 DIAGNOSIS — Z90710 Acquired absence of both cervix and uterus: Secondary | ICD-10-CM | POA: Insufficient documentation

## 2015-09-10 DIAGNOSIS — I700 Atherosclerosis of aorta: Secondary | ICD-10-CM | POA: Diagnosis not present

## 2015-09-10 LAB — HEPATITIS C ANTIBODY: HCV AB: NEGATIVE

## 2015-09-10 MED ORDER — IOHEXOL 300 MG/ML  SOLN
100.0000 mL | Freq: Once | INTRAMUSCULAR | Status: AC | PRN
  Administered 2015-09-10: 100 mL via INTRAVENOUS

## 2015-09-10 NOTE — Telephone Encounter (Signed)
Called patient to discuss lab work and CT scan results. Advised patient of CT scan results and we discussed pulmonary nodule. She is unsure if she feels comfortable waiting a year prior to her next CT scan. I did discuss that if she was uncomfortable with this we could refer her to pulmonology for further recommendation. She states she will think about this. We did discuss the rest of the findings from the CT scans as well and patient will follow-up in the office for further discussion and management. Advised of hepatitis C and HIV results as well. Patient will weigh herself at home to monitor for further weight loss. She will start taking in boost 2 times a day. We will also obtain a mammogram. She will let us know if anything changes prior to her follow-up appointment.

## 2015-09-10 NOTE — Assessment & Plan Note (Signed)
Right upper lobe, 09/10/15

## 2015-09-13 ENCOUNTER — Ambulatory Visit: Payer: Commercial Managed Care - HMO | Admitting: Family Medicine

## 2015-09-15 ENCOUNTER — Telehealth: Payer: Self-pay | Admitting: Family Medicine

## 2015-09-15 NOTE — Telephone Encounter (Signed)
Patient called me on 09-14-2015, stated she has discharged [place] from her care. She will be getting her medical care else where.  Brittany Mays says she did not have any problems with Dr. Mariea Clonts.  cdavis

## 2015-09-23 ENCOUNTER — Other Ambulatory Visit: Payer: Self-pay | Admitting: Family Medicine

## 2015-09-23 ENCOUNTER — Encounter: Payer: Commercial Managed Care - HMO | Admitting: Internal Medicine

## 2015-09-23 DIAGNOSIS — Z1231 Encounter for screening mammogram for malignant neoplasm of breast: Secondary | ICD-10-CM

## 2015-10-05 ENCOUNTER — Telehealth: Payer: Self-pay | Admitting: Family Medicine

## 2015-10-11 ENCOUNTER — Ambulatory Visit (INDEPENDENT_AMBULATORY_CARE_PROVIDER_SITE_OTHER): Payer: Commercial Managed Care - HMO | Admitting: Family Medicine

## 2015-10-11 ENCOUNTER — Encounter: Payer: Self-pay | Admitting: Family Medicine

## 2015-10-11 VITALS — BP 116/74 | HR 99 | Temp 98.5°F | Ht 62.0 in | Wt 131.8 lb

## 2015-10-11 DIAGNOSIS — R911 Solitary pulmonary nodule: Secondary | ICD-10-CM

## 2015-10-11 DIAGNOSIS — F329 Major depressive disorder, single episode, unspecified: Secondary | ICD-10-CM | POA: Diagnosis not present

## 2015-10-11 DIAGNOSIS — R634 Abnormal weight loss: Secondary | ICD-10-CM | POA: Diagnosis not present

## 2015-10-11 DIAGNOSIS — F32A Depression, unspecified: Secondary | ICD-10-CM

## 2015-10-11 NOTE — Progress Notes (Signed)
Patient ID: Brittany Mays, female   DOB: 03/12/1941, 74 y.o.   MRN: [id number]  [name], MD Phone: [phone]  Brittany Mays is a 74 y.o. female who presents today for follow-up.  Depression: Patient notes her depression is significant only worsened. She has been overwhelmed raising her 21 year old granddaughter who gives her lots of trouble. She's also had back surgery recently for second time. Also notes that finding of lung nodule on her chest CT has been worrisome as well. She notes she is having difficult coping with this. She is having issues with sleep, feeling depressed, having little interest in doing things, having decreased energy, feeling bad about herself, having difficult concentrating, and feeling as though she is moving and speaking slowly. She does note thoughts that she "wants out of it", stating she would like to not feel this way anymore. She does note greater than 4 months ago she said she is going to take a bottle of her pain medication, though her husband took this way from her. She states she said this, though had no intent to do this. She has not had any intent or plan to harm herself since that time. She has no HI. She has no intent or plan to harm herself at this time. GAD 7 score 19, extremely difficult PHQ 9 score 23, extremely difficult  She notes she has gained several pounds. She is eating more food. Her mammogram is scheduled for Thursday. She's worried about the lung nodule.  PMH: Former smoker   ROS see history of present illness  Objective  Physical Exam Filed Vitals:   10/11/15 1029  BP: 116/74  Pulse: 99  Temp: 98.5 F (36.9 C)   Physical Exam  Constitutional: She is well-developed, well-nourished, and in no distress.  HENT:  Head: Normocephalic and atraumatic.  Cardiovascular: Normal rate, regular rhythm and normal heart sounds.  Exam reveals no gallop and no friction rub.   No murmur heard. Pulmonary/Chest: Effort normal and breath  sounds normal. No respiratory distress. She has no wheezes. She has no rales.  Skin: Skin is warm and dry. She is not diaphoretic.  Psychiatric:  Mood depressed and anxious, affect depressed and anxious, she is intermittently tearful     Assessment/Plan: Please see individual problem list.  Depression Her depression has worsened surrounding stressful life events. Discussed this at length with the patient. We will refer her to psychiatry given her multiple trials of medication previously. She was also given the name of the psychologist to call. She is contracted for safety today. She promises to call us if she develops any thoughts of harming herself or others. She will call 911 if she develops any intent or plan to harm herself. She states she will not harm herself at this time. She is given return precautions.  Solitary pulmonary nodule Discussed at length options for following this. We had previously discussed repeating the CT scan as recommended by radiology, though patient has significant worry regarding this. Given the finding of a lung nodule and the patient's concern we will refer to pulmonology for further recommendations.  Unintentional weight loss Given the level of depression today suspect this is the cause of her weight loss. Patient's last piece of workup is a mammogram which she has scheduled this week. She will follow up with pulmonology for the lung nodule. She has gained weight at this time. We will continue to monitor this and have her treated for her depression.   patient was additionally advised  not to take the [place] supplementation that she brought him to the office. I discussed lack of evidence for supplements and lack of regulation.  Orders Placed This Encounter  Procedures  . Ambulatory referral to Pulmonology    Referral Priority:  Routine    Referral Type:  Consultation    Referral Reason:  Specialty Services Required    Requested Specialty:  Pulmonary Disease     Number of Visits Requested:  1  . Ambulatory referral to Psychiatry    Referral Priority:  Urgent    Referral Type:  Psychiatric    Referral Reason:  Specialty Services Required    Requested Specialty:  Psychiatry    Number of Visits Requested:  1    [name]

## 2015-10-11 NOTE — Assessment & Plan Note (Signed)
Her depression has worsened surrounding stressful life events. Discussed this at length with the patient. We will refer her to psychiatry given her multiple trials of medication previously. She was also given the name of the psychologist to call. She is contracted for safety today. She promises to call us if she develops any thoughts of harming herself or others. She will call 911 if she develops any intent or plan to harm herself. She states she will not harm herself at this time. She is given return precautions.

## 2015-10-11 NOTE — Assessment & Plan Note (Signed)
Discussed at length options for following this. We had previously discussed repeating the CT scan as recommended by radiology, though patient has significant worry regarding this. Given the finding of a lung nodule and the patient's concern we will refer to pulmonology for further recommendations.

## 2015-10-11 NOTE — Patient Instructions (Signed)
Nice to see you. We will refer you to psychiatry. Please call the psychologist at the number provided. We will refer you to pulmonology as well. You've contracted for safety. If you developed any new thoughts of harming yourself or others you will seek medical attention immediately. If you develop any thoughts, intent, or plan of harming herself, your depression worsens, he began to feel more anxious, or you develop any new or change in symptoms she'll need a seek medical attention immediately.

## 2015-10-11 NOTE — Assessment & Plan Note (Signed)
Given the level of depression today suspect this is the cause of her weight loss. Patient's last piece of workup is a mammogram which she has scheduled this week. She will follow up with pulmonology for the lung nodule. She has gained weight at this time. We will continue to monitor this and have her treated for her depression.

## 2015-10-11 NOTE — Progress Notes (Signed)
Pre visit review using our clinic review tool, if applicable. No additional management support is needed unless otherwise documented below in the visit note. 

## 2015-10-12 ENCOUNTER — Telehealth: Payer: Self-pay

## 2015-10-12 NOTE — Telephone Encounter (Signed)
That would be fine 

## 2015-10-12 NOTE — Telephone Encounter (Signed)
Pt called to let you know the psychiatry provider you referred her to is not in network. They recommended a Eugene Garnet would this be ok with you?

## 2015-10-12 NOTE — Telephone Encounter (Signed)
Spoke with patient and she has been trying to get in touch with Brittany Mays for an appointment. She will continue to try and if no luck will let us know.

## 2015-10-14 DIAGNOSIS — Z1231 Encounter for screening mammogram for malignant neoplasm of breast: Secondary | ICD-10-CM | POA: Diagnosis not present

## 2015-10-14 LAB — HM MAMMOGRAPHY: HM Mammogram: NEGATIVE

## 2015-10-15 ENCOUNTER — Telehealth: Payer: Self-pay | Admitting: *Deleted

## 2015-10-15 DIAGNOSIS — F329 Major depressive disorder, single episode, unspecified: Secondary | ICD-10-CM

## 2015-10-15 DIAGNOSIS — F32A Depression, unspecified: Secondary | ICD-10-CM

## 2015-10-15 NOTE — Telephone Encounter (Signed)
She attempted to call the prior Dr. Eugene Garnet and he is not in her [place].  Please advise on another provider for this referral.  Patient was upset that the past two are not in her network.  thanks

## 2015-10-15 NOTE — Telephone Encounter (Signed)
I will place a referral to psychology.

## 2015-10-15 NOTE — Telephone Encounter (Signed)
Patient requested a call back in reference to being referred to a psychologist. Please advise

## 2015-10-20 ENCOUNTER — Telehealth: Payer: Self-pay

## 2015-10-20 ENCOUNTER — Encounter: Payer: Self-pay | Admitting: Surgical

## 2015-10-20 NOTE — Telephone Encounter (Signed)
Patient called the triage line requesting her mammogram results, please advise.  Thanks

## 2015-10-20 NOTE — Telephone Encounter (Signed)
Notified patient of Mammogram results that was negative.

## 2015-10-20 NOTE — Telephone Encounter (Signed)
Please let her know that her mammogram was normal

## 2015-10-27 ENCOUNTER — Ambulatory Visit: Payer: Commercial Managed Care - HMO | Admitting: Family Medicine

## 2015-10-27 DIAGNOSIS — M791 Myalgia: Secondary | ICD-10-CM | POA: Diagnosis not present

## 2015-10-27 DIAGNOSIS — M4326 Fusion of spine, lumbar region: Secondary | ICD-10-CM | POA: Diagnosis not present

## 2015-10-28 ENCOUNTER — Ambulatory Visit (INDEPENDENT_AMBULATORY_CARE_PROVIDER_SITE_OTHER): Payer: Commercial Managed Care - HMO | Admitting: Internal Medicine

## 2015-10-28 ENCOUNTER — Encounter: Payer: Self-pay | Admitting: Internal Medicine

## 2015-10-28 VITALS — BP 132/76 | HR 96 | Ht 61.0 in | Wt 128.8 lb

## 2015-10-28 DIAGNOSIS — R911 Solitary pulmonary nodule: Secondary | ICD-10-CM

## 2015-10-28 NOTE — Patient Instructions (Signed)
Pulmonary Nodule A pulmonary nodule is a small, round growth of tissue in the lung. Pulmonary nodules can range in size from less than 1/5 inch (4 mm) to a little bigger than an inch (25 mm). Most pulmonary nodules are detected when imaging tests of the lung are being performed for a different problem. Pulmonary nodules are usually not cancerous (benign). However, some pulmonary nodules are cancerous (malignant). Follow-up treatment or testing is based on the size of the pulmonary nodule and your risk of getting lung cancer.  CAUSES Benign pulmonary nodules can be caused by various things. Some of the causes include:   Bacterial, fungal, or viral infections. This is usually an old infection that is no longer active, but it can sometimes be a current, active infection.  A benign mass of tissue.  Inflammation from conditions such as rheumatoid arthritis.   Abnormal blood vessels in the lungs. Malignant pulmonary nodules can result from lung cancer or from cancers that spread to the lung from other places in the body. SIGNS AND SYMPTOMS Pulmonary nodules usually do not cause symptoms. DIAGNOSIS Most often, pulmonary nodules are found incidentally when an X-ray or CT scan is performed to look for some other problem in the lung area. To help determine whether a pulmonary nodule is benign or malignant, your health care provider will take a medical history and order a variety of tests. Tests done may include:   Blood tests.  A skin test called a tuberculin test. This test is used to determine if you have been exposed to the germ that causes tuberculosis.   Chest X-rays. If possible, a new X-ray may be compared with X-rays you have had in the past.   CT scan. This test shows smaller pulmonary nodules more clearly than an X-ray.   Positron emission tomography (PET) scan. In this test, a safe amount of a radioactive substance is injected into the bloodstream. Then, the scan takes a picture of  the pulmonary nodule. The radioactive substance is eliminated from your body in your urine.   Biopsy. A tiny piece of the pulmonary nodule is removed so it can be checked under a microscope. TREATMENT  Pulmonary nodules that are benign normally do not require any treatment because they usually do not cause symptoms or breathing problems. Your health care provider may want to monitor the pulmonary nodule through follow-up CT scans. The frequency of these CT scans will vary based on the size of the nodule and the risk factors for lung cancer. For example, CT scans will need to be done more frequently if the pulmonary nodule is larger and if you have a history of smoking and a family history of cancer. Further testing or biopsies may be done if any follow-up CT scan shows that the size of the pulmonary nodule has increased. HOME CARE INSTRUCTIONS  Only take over-the-counter or prescription medicines as directed by your health care provider.  Keep all follow-up appointments with your health care provider. SEEK MEDICAL CARE IF:  You have trouble breathing when you are active.   You feel sick or unusually tired.   You do not feel like eating.   You lose weight without trying to.   You develop chills or night sweats.  SEEK IMMEDIATE MEDICAL CARE IF:  You cannot catch your breath, or you begin wheezing.   You cannot stop coughing.   You cough up blood.   You become dizzy or feel like you are going to pass out.   You   have sudden chest pain.   You have a fever or persistent symptoms for more than 2-3 days.   You have a fever and your symptoms suddenly get worse. MAKE SURE YOU:  Understand these instructions.  Will watch your condition.  Will get help right away if you are not doing well or get worse.   This information is not intended to replace advice given to you by your health care provider. Make sure you discuss any questions you have with your health care  provider.   Document Released: 08/20/2009 Document Revised: 06/25/2013 Document Reviewed: 04/14/2013 Elsevier Interactive Patient Education 2016 Elsevier Inc.  

## 2015-10-28 NOTE — Progress Notes (Signed)
[facility] Consultation      Date: 10/28/2015,   MRN# [id number] Brittany Mays [birth year] Code Status:  Hosp day:@LENGTHOFSTAYDAYS @ Referring MD: @ATDPROV @     PCP:      AdmissionWeight: 128 lb 12.8 oz (58.423 kg)                 CurrentWeight: 128 lb 12.8 oz (58.423 kg) Brittany Mays is a 74 y.o. old female seen in consultation for lung nodule at the request of Dr. Caryl Bis.     CHIEF COMPLAINT:  Here to talk about lung nodule    HISTORY OF PRESENT ILLNESS   74 yo white female here to discuss 4 MM lung nodule found on CT chest on 09/10/15 images reviewed 10/28/2015   Patient is a smoker on/off for last 50 years smokes approx 1 ppd. Patient has had recent back surgeries and had a 35 pound weight loss Patient has had extensive work up including CT chest  Patient has no SOB, no CP, no DOE, no wheezing Patient has no symptoms at this time. Patient very anxious about 4 MM nodule  Patient has no signs of infection at this time, patient has some morning nausea and constipation, but no resp symptoms Patient has no lower ext edema      PAST MEDICAL HISTORY   Past Medical History  Diagnosis Date  . Depression   . Anxiety   . Insomnia   . Pain   . Cataract   . Osteoarthritis of both knees     s/p knee replacements  . Lumbar spinal stenosis     s/p laminectomy  . Hyperparathyroidism, primary ([place])     s/p parathyroidectomy  . Rectal prolapse     s/p repair  . Kidney stone      SURGICAL HISTORY   Past Surgical History  Procedure Laterality Date  . Abdominal hysterectomy    . Knee surgery Bilateral     Knee Replacements  . Spine surgery    . Parathyroidectomy    . Colonoscopy  2013  . Appendectomy    . Breast lumpectomy      Reports this is benign  . Tonsillectomy       FAMILY HISTORY   Family History  Problem Relation Age of Onset  . COPD Father   . Heart failure Father   . Diabetes Son   . Mental illness Mother       SOCIAL HISTORY   Social History  Substance Use Topics  . Smoking status: Current Every Day Smoker -- 0.50 packs/day for 50 years    Types: Cigarettes  . Smokeless tobacco: None  . Alcohol Use: No     MEDICATIONS    Home Medication:  Current Outpatient Rx  Name  Route  Sig  Dispense  Refill  . cyanocobalamin 100 MCG tablet   Oral   Take 100 mcg by mouth daily.         . DULoxetine (CYMBALTA) 30 MG capsule      Take one tablet by mouth once daily for depression   90 capsule   3   . fish oil-omega-3 fatty acids 1000 MG capsule   Oral   Take 1 g by mouth daily.          . Melatonin 3 MG CAPS   Oral   Take 1 capsule (3 mg total) by mouth at bedtime as needed (insomnia).   30 capsule   0   . Multiple Vitamins-Minerals (  WOMENS MULTI) CAPS      Take one tablet once daily         . oxyCODONE (ROXICODONE) 15 MG immediate release tablet      TK 1 T PO Q 4 H PRN P      0   . Oxycodone HCl 20 MG TABS      10 mg.          . vitamin B-12 (CYANOCOBALAMIN) 100 MCG tablet               . zoster vaccine live, PF, (ZOSTAVAX) 16109 UNT/0.65ML injection   Subcutaneous   Inject 19,400 Units into the skin once.   1 each   0     Current Medication:  Current outpatient prescriptions:  .  cyanocobalamin 100 MCG tablet, Take 100 mcg by mouth daily., Disp: , Rfl:  .  DULoxetine (CYMBALTA) 30 MG capsule, Take one tablet by mouth once daily for depression, Disp: 90 capsule, Rfl: 3 .  fish oil-omega-3 fatty acids 1000 MG capsule, Take 1 g by mouth daily. , Disp: , Rfl:  .  Melatonin 3 MG CAPS, Take 1 capsule (3 mg total) by mouth at bedtime as needed (insomnia)., Disp: 30 capsule, Rfl: 0 .  Multiple Vitamins-Minerals (WOMENS MULTI) CAPS, Take one tablet once daily, Disp: , Rfl:  .  oxyCODONE (ROXICODONE) 15 MG immediate release tablet, TK 1 T PO Q 4 H PRN P, Disp: , Rfl: 0 .  Oxycodone HCl 20 MG TABS, 10 mg. , Disp: , Rfl:  .  vitamin B-12 (CYANOCOBALAMIN)  100 MCG tablet, , Disp: , Rfl:  .  zoster vaccine live, PF, (ZOSTAVAX) 60454 UNT/0.65ML injection, Inject 19,400 Units into the skin once., Disp: 1 each, Rfl: 0    ALLERGIES   Nsaids; Sodium pentobarbital; Codeine; and Fentanyl     REVIEW OF SYSTEMS   Review of Systems  Constitutional: Positive for weight loss. Negative for fever, chills and malaise/fatigue.  HENT: Negative for congestion.   Respiratory: Negative for cough, hemoptysis, sputum production, shortness of breath and wheezing.   Cardiovascular: Negative for chest pain and orthopnea.  Gastrointestinal: Positive for nausea and constipation. Negative for heartburn, vomiting and abdominal pain.  Genitourinary: Negative.   Musculoskeletal: Positive for back pain.  Neurological: Negative.  Negative for headaches.  Psychiatric/Behavioral: Positive for depression. The patient is nervous/anxious.      VS: BP 132/76 mmHg  Pulse 96  Ht 5\' 1"  (1.549 m)  Wt 128 lb 12.8 oz (58.423 kg)  BMI 24.35 kg/m2  SpO2 95%     PHYSICAL EXAM  Physical Exam  Constitutional: She is oriented to person, place, and time. She appears well-developed and well-nourished. No distress.  HENT:  Head: Normocephalic and atraumatic.  Eyes: Conjunctivae are normal. Pupils are equal, round, and reactive to light.  Neck: Normal range of motion. Neck supple.  Cardiovascular: Normal rate and regular rhythm.   No murmur heard. Pulmonary/Chest: Effort normal and breath sounds normal. No respiratory distress. She has no wheezes. She has no rales. She exhibits no tenderness.  Abdominal: Soft.  Musculoskeletal: Normal range of motion. She exhibits no edema.  Neurological: She is alert and oriented to person, place, and time. No cranial nerve deficit.  Skin: Skin is warm and dry. She is not diaphoretic.       ASSESSMENT/PLAN   74 yo white female with 4 MM RUL lung nodule with active smoking history without any signs or symptoms of COPD, at this time,  patient is at risk for lung cancer as she actively smokes. Will need to re-assess nodule in 6 months  1.I would recommend Follow up CT chest in 6 months  2.smoking cessation strongly advised 3.recommend Gi follow up    I have personally obtained a history, examined the patient, evaluated laboratory and independently reviewed imaging results, formulated the assessment and plan and placed orders. The Patient requires high complexity decision making for assessment and support, frequent evaluation and titration of therapies, application of advanced monitoring technologies and extensive interpretation of multiple databases. Patient/Family are satisfied with Plan of action and management. All questions answered  Corrin Parker, M.D.  [facility]  Medical Director [place] Director [facility] Cardio-Pulmonary Department

## 2015-11-02 ENCOUNTER — Encounter: Payer: Self-pay | Admitting: Family Medicine

## 2015-11-02 ENCOUNTER — Ambulatory Visit (INDEPENDENT_AMBULATORY_CARE_PROVIDER_SITE_OTHER): Payer: Commercial Managed Care - HMO | Admitting: Family Medicine

## 2015-11-02 VITALS — BP 106/62 | HR 91 | Temp 97.8°F | Ht 61.0 in | Wt 130.0 lb

## 2015-11-02 DIAGNOSIS — Z5181 Encounter for therapeutic drug level monitoring: Secondary | ICD-10-CM

## 2015-11-02 DIAGNOSIS — K5900 Constipation, unspecified: Secondary | ICD-10-CM

## 2015-11-02 DIAGNOSIS — R634 Abnormal weight loss: Secondary | ICD-10-CM | POA: Diagnosis not present

## 2015-11-02 DIAGNOSIS — F32A Depression, unspecified: Secondary | ICD-10-CM

## 2015-11-02 DIAGNOSIS — F329 Major depressive disorder, single episode, unspecified: Secondary | ICD-10-CM | POA: Diagnosis not present

## 2015-11-02 LAB — BASIC METABOLIC PANEL
BUN: 15 mg/dL (ref 6–23)
CHLORIDE: 102 meq/L (ref 96–112)
CO2: 31 mEq/L (ref 19–32)
Calcium: 9.4 mg/dL (ref 8.4–10.5)
Creatinine, Ser: 0.77 mg/dL (ref 0.40–1.20)
GFR: 77.29 mL/min (ref >60.00)
Glucose, Bld: 100 mg/dL — ABNORMAL HIGH (ref 70–99)
POTASSIUM: 4.1 meq/L (ref 3.5–5.1)
SODIUM: 140 meq/L (ref 135–145)

## 2015-11-02 MED ORDER — DULOXETINE HCL 60 MG PO CPEP: 60.0000 mg | ORAL_CAPSULE | Freq: Every day | ORAL | Status: DC

## 2015-11-02 NOTE — Assessment & Plan Note (Signed)
Much improved. Reports daily bowel movements. Some intermittent nausea. No abdominal pain. We'll continue to monitor this. We will refer to GI for the nausea and weight loss.

## 2015-11-02 NOTE — Progress Notes (Signed)
Pre visit review using our clinic review tool, if applicable. No additional management support is needed unless otherwise documented below in the visit note. 

## 2015-11-02 NOTE — Assessment & Plan Note (Signed)
Patient reports this is unchanged from previously. PHQ 9 score reveals possibly it is a little improved. We will increase the patient's Cymbalta to 60 mg daily. We will check a BMP to evaluate her sodium with this. She will keep her appointment with the psychologist in January. She is given return precautions.

## 2015-11-02 NOTE — Assessment & Plan Note (Signed)
Weight has been stable. Extensive workup has not revealed an organic cause. Suspect this is likely related to her depression. With her nausea and weight loss we will refer her for evaluation by GI, though abdominal and pelvic CT was negative for cause and recent colonoscopy reportedly normal. We'll treat her depression as outlined in the depression problem. Given return precautions.

## 2015-11-02 NOTE — Progress Notes (Signed)
Patient ID: EMMIE FEKETE, female   DOB: May 01, 1941, 74 y.o.   MRN: [id number]  [name], MD Phone: [phone]  Brittany Mays is a 74 y.o. female who presents today for follow-up.  Depression: Patient notes this is unchanged. She reports little interest and pleasure in doing things. She reports feeling depressed. She reports having issues sleeping. She feels tired all the time. She does feel bad about herself as well. She denies SI and HI. She's been on Cymbalta 30 mg daily for about a year and is unsure if this is helping. She has an appointment with a psychologist in January.  Weight loss: Patient has had extensive workup for weight loss. CT chest abdomen and pelvis revealed a 4 mm lung nodule for which she is already seen pulmonology and the plan is to repeat the scan in 6 months. She had a colonoscopy about 3 years ago that was reportedly normal. She denies blood per rectum. She's had negative lab workup for cause. On review of pulmonology note there is some mention of possible GI referral. She has been taking boost once daily in addition eating 3 meals a day. She's not limiting her food intake.  Constipation: Patient notes this is much improved. No constipation at this time. Patient notes no abdominal pain, though does note nausea relatively frequently with gas. No vomiting or diarrhea. Notes she has a bowel movement every day. No blood in her stool.  PMH: Current smoker.   ROS see history of present illness  Objective  Physical Exam Filed Vitals:   11/02/15 1038  BP: 106/62  Pulse: 91  Temp: 97.8 F (36.6 C)   Physical Exam  Constitutional: She is well-developed, well-nourished, and in no distress.  HENT:  Head: Normocephalic and atraumatic.  Right Ear: External ear normal.  Left Ear: External ear normal.  Mouth/Throat: Oropharynx is clear and moist.  Neck: Neck supple.  Cardiovascular: Normal rate, regular rhythm and normal heart sounds.   Pulmonary/Chest: Effort  normal and breath sounds normal.  Abdominal: Soft. Bowel sounds are normal. She exhibits no distension. There is no tenderness. There is no rebound and no guarding.  Musculoskeletal: She exhibits no edema.  Lymphadenopathy:    She has no cervical adenopathy.  Neurological: She is alert. Gait normal.  Skin: Skin is warm and dry. She is not diaphoretic.  Psychiatric:  Mood depressed, affect depressed     Assessment/Plan: Please see individual problem list.  Depression Patient reports this is unchanged from previously. PHQ 9 score reveals possibly it is a little improved. We will increase the patient's Cymbalta to 60 mg daily. We will check a BMP to evaluate her sodium with this. She will keep her appointment with the psychologist in January. She is given return precautions.  Constipation Much improved. Reports daily bowel movements. Some intermittent nausea. No abdominal pain. We'll continue to monitor this. We will refer to GI for the nausea and weight loss.  Unintentional weight loss Weight has been stable. Extensive workup has not revealed an organic cause. Suspect this is likely related to her depression. With her nausea and weight loss we will refer her for evaluation by GI, though abdominal and pelvic CT was negative for cause and recent colonoscopy reportedly normal. We'll treat her depression as outlined in the depression problem. Given return precautions.    Orders Placed This Encounter  Procedures  . Basic Metabolic Panel (BMET)  . Ambulatory referral to Gastroenterology    Referral Priority:  Routine  Referral Type:  Consultation    Referral Reason:  Specialty Services Required    Number of Visits Requested:  1    Meds ordered this encounter  Medications  . DULoxetine (CYMBALTA) 60 MG capsule    Sig: Take 1 capsule (60 mg total) by mouth daily.    Dispense:  90 capsule    Refill:  3    Dragon voice recognition software was used during the dictation process of  this note. If any phrases or words seem inappropriate it is likely secondary to the translation process being inefficient.  [name]

## 2015-11-02 NOTE — Patient Instructions (Signed)
Nice to see you. We will increase your Cymbalta to 60 mg daily. We need to check some blood work today as well. Please continue the boost and eating 3 meals a day. We will have you see a GI physician given her weight loss. If you develop thoughts of harming herself or others, abdominal pain, constipation, nausea, diarrhea, fevers, or any new or change in symptoms please seek medical attention.

## 2015-11-03 ENCOUNTER — Encounter: Payer: Self-pay | Admitting: Family Medicine

## 2015-11-09 ENCOUNTER — Ambulatory Visit: Payer: Commercial Managed Care - HMO

## 2015-11-17 ENCOUNTER — Ambulatory Visit: Payer: Commercial Managed Care - HMO | Admitting: Psychology

## 2015-12-01 ENCOUNTER — Ambulatory Visit: Payer: Commercial Managed Care - HMO | Admitting: Psychology

## 2015-12-06 ENCOUNTER — Ambulatory Visit (INDEPENDENT_AMBULATORY_CARE_PROVIDER_SITE_OTHER): Payer: Commercial Managed Care - HMO | Admitting: Family Medicine

## 2015-12-06 ENCOUNTER — Encounter: Payer: Self-pay | Admitting: Family Medicine

## 2015-12-06 VITALS — BP 118/66 | HR 98 | Temp 97.8°F | Ht 61.0 in | Wt 130.6 lb

## 2015-12-06 DIAGNOSIS — R634 Abnormal weight loss: Secondary | ICD-10-CM

## 2015-12-06 DIAGNOSIS — F329 Major depressive disorder, single episode, unspecified: Secondary | ICD-10-CM | POA: Diagnosis not present

## 2015-12-06 DIAGNOSIS — M25552 Pain in left hip: Secondary | ICD-10-CM

## 2015-12-06 DIAGNOSIS — G8929 Other chronic pain: Secondary | ICD-10-CM | POA: Insufficient documentation

## 2015-12-06 DIAGNOSIS — F32A Depression, unspecified: Secondary | ICD-10-CM

## 2015-12-06 NOTE — Progress Notes (Signed)
Patient ID: Brittany Mays, female   DOB: Mar 16, 1941, 75 y.o.   MRN: [id number]  [name], MD Phone: [phone]  Brittany Mays is a 75 y.o. female who presents today for follow-up  Depression: Patient notes this is much improved on the increased dose of Cymbalta. She notes her appetite is good. She denies SI and HI. She declined seeing the psychologist as she has felt improved.  Weight loss: Patient's weight has been stable recently. She is eating 3 meals a day. Intermittently drinks extra boost for a meal supplement. Thus far workup has been negative for cause other than for depression. She has follow-up with gastroenterology tomorrow for consideration of colonoscopy.  Left hip pain: Patient notes for several months she has had a dull aching pain during the day in her left hip. At night it becomes a burning, stabbing discomfort. Occurs posterior lateral hip around to her anterior hip. No numbness or weakness. No back pain with this. No bowel or bladder incontinence, saddle anesthesia, or fevers. No injury to the area. Note she had an x-ray of her left hip several months ago at [facility] and they said it had arthritis. She would like a referral to physical medicine and rehabilitation at her neurosurgeon's office for possible injection into her hip.  PMH: smoker.   ROS see history of present illness  Objective  Physical Exam Filed Vitals:   12/06/15 1043  BP: 118/66  Pulse: 98  Temp: 97.8 F (36.6 C)   Wt Readings from Last 3 Encounters:  12/06/15 130 lb 9.6 oz (59.24 kg)  11/02/15 130 lb (58.968 kg)  10/28/15 128 lb 12.8 oz (58.423 kg)    Physical Exam  Constitutional: She is well-developed, well-nourished, and in no distress.  HENT:  Head: Normocephalic and atraumatic.  Right Ear: External ear normal.  Cardiovascular: Normal rate, regular rhythm and normal heart sounds.  Exam reveals no gallop and no friction rub.   No murmur heard. Pulmonary/Chest: Effort  normal and breath sounds normal. No respiratory distress. She has no wheezes. She has no rales.  Abdominal: Soft. Bowel sounds are normal. She exhibits no distension. There is no tenderness. There is no rebound and no guarding.  Musculoskeletal:  No midline spine tenderness, no midline spine step-off, no muscular back tenderness, no tenderness of her left buttock, no tenderness of her left lateral hip, no discomfort on internal or external rotation of her left or right hip  Neurological:  5 out of 5 strength in bilateral quads, hamstrings, plantar flexion, and dorsiflexion, sensation to light touch intact in bilateral lower extremities  Skin: Skin is warm and dry. She is not diaphoretic.  Psychiatric: Affect normal.  Mood mildly depressed     Assessment/Plan: Please see individual problem list.  Depression Much improved. We'll continue Cymbalta at current dosing. If worsens she will follow-up with psychology.  Unintentional weight loss Suspect this is related to her depression. Weight is stable at this time. Workup thus far has been negative for cause. She'll follow-up with GI tomorrow to get their input for possible colonoscopy.  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 Prior MRI of her left hip revealed severe arthritis. Benign exam today. We will refer to physical medicine and rehabilitation at her neurosurgeon's office (Dr Payton Mccallum) for evaluation per patient request. Continue current pain medication. Given return precautions.    Orders Placed This Encounter  Procedures  . Ambulatory referral to Orthopedic Surgery    Referral Priority:  Routine    Referral  Type:  Surgical    Referral Reason:  Specialty Services Required    Requested Specialty:  Orthopedic Surgery    Number of Visits Requested:  1     [name]

## 2015-12-06 NOTE — Assessment & Plan Note (Signed)
Likely related to osteoarthritis. Prior MRI of her left hip revealed severe arthritis. Benign exam today. We will refer to physical medicine and rehabilitation at her neurosurgeon's office (Dr Payton Mccallum) for evaluation per patient request. Continue current pain medication. Given return precautions.

## 2015-12-06 NOTE — Assessment & Plan Note (Signed)
Suspect this is related to her depression. Weight is stable at this time. Workup thus far has been negative for cause. She'll follow-up with GI tomorrow to get their input for possible colonoscopy.

## 2015-12-06 NOTE — Patient Instructions (Signed)
Nice to see you. Please continue your Cymbalta. If you develop worsening depression please let us know. Please continue to eat 3 meals a day and add supplemental boost. We will refer you for your hip pain. If you develop thoughts of harming herself or others, weight loss, fevers, increasing hip pain, or any new or changing symptoms please seek medical attention.

## 2015-12-06 NOTE — Assessment & Plan Note (Signed)
Much improved. We'll continue Cymbalta at current dosing. If worsens she will follow-up with psychology.

## 2015-12-06 NOTE — Progress Notes (Signed)
Pre visit review using our clinic review tool, if applicable. No additional management support is needed unless otherwise documented below in the visit note. 

## 2015-12-07 ENCOUNTER — Ambulatory Visit (INDEPENDENT_AMBULATORY_CARE_PROVIDER_SITE_OTHER): Payer: Commercial Managed Care - HMO | Admitting: Physician Assistant

## 2015-12-07 ENCOUNTER — Encounter: Payer: Self-pay | Admitting: Physician Assistant

## 2015-12-07 VITALS — BP 90/66 | HR 96 | Ht 60.24 in | Wt 130.2 lb

## 2015-12-07 DIAGNOSIS — Z86010 Personal history of colonic polyps: Secondary | ICD-10-CM

## 2015-12-07 DIAGNOSIS — R634 Abnormal weight loss: Secondary | ICD-10-CM

## 2015-12-07 MED ORDER — POLYETHYLENE GLYCOL 3350 17 GM/SCOOP PO POWD: 1.0000 | Freq: Every day | ORAL | Status: DC

## 2015-12-07 NOTE — Progress Notes (Signed)
Agree with Ms. Esterwood's assessment and plan. Sharonlee Nine E. Hailey Stormer, MD, FACG   

## 2015-12-07 NOTE — Patient Instructions (Addendum)
You have been scheduled for a colonoscopy. Please follow written instructions given to you at your visit today.  Please pick up your prep supplies at the pharmacy within the next 1-3 days. [facility], [address].  We sent a prescription for the generic Miralax.   If you use inhalers (even only as needed), please bring them with you on the day of your procedure. Your physician has requested that you go to www.startemmi.com and enter the access code given to you at your visit today. This web site gives a general overview about your procedure. However, you should still follow specific instructions given to you by our office regarding your preparation for the procedure.

## 2015-12-07 NOTE — Progress Notes (Addendum)
Patient ID: Brittany Mays, female   DOB: 1941-01-17, 75 y.o.   MRN: [id number]   Subjective:    Patient ID: Brittany Mays, female    DOB: January 01, 1941, 75 y.o.   MRN: [id number]  HPI  Brittany Mays   Is a pleasant 75 year old white female new to GI today referred by Dr. Caryl Mays for evaluation of weight loss. Patient relates she underwent spinal fusion surgery in September 2016 and lost all of her weight within 4-5 weeks after her surgery. She says she had a "terrible  time" with surgery and was unable to eat for a long time postoperatively. She says since that time her appetite has gradually returned but she still not eating as much as she used to. Her weight has leveled off at 128 -130 pounds which is about 30 pounds less than preoperatively. At this time she says she is not continuing to lose weight -she has just been unable to gain the weight back.  BMI is currently 25.4 which is a good range for her.  She denies any difficulty with heartburn ,indigestion ,dysphagia ,abdominal pain ,nausea She has had some mild constipation but is still taking hydrocodone for pain. She's using as needed laxatives which have been helpful. She has not noted any melena or hematochezia.  Patient had labs done in November 2016 which were unremarkable, CT of the chest abdomen and pelvis were also done in mid November 2016 and other than a 4 mm right upper lobe longer nodule which is being followed CTs were negative.  Patient does have history of colon polyps and says she had a colonoscopy done with [facility] about 5 years ago and thinks she is due for follow-up.  Patient had also had some depression postoperatively which is being treated.  Review of Systems Pertinent positive and negative review of systems were noted in the above HPI section.  All other review of systems was otherwise negative.  Outpatient Encounter Prescriptions as of 12/07/2015  Medication Sig  . DULoxetine (CYMBALTA) 60 MG capsule Take 1 capsule (60 mg  total) by mouth daily.  . fish oil-omega-3 fatty acids 1000 MG capsule Take 1 g by mouth daily.   . Melatonin 3 MG CAPS Take 1 capsule (3 mg total) by mouth at bedtime as needed (insomnia). (Patient taking differently: Take 6 mg by mouth at bedtime as needed (insomnia). )  . Multiple Vitamins-Minerals (WOMENS MULTI) CAPS Take one tablet once daily  . Oxycodone HCl 10 MG TABS TK 1 T PO Q 6 H PRN  . vitamin B-12 (CYANOCOBALAMIN) 100 MCG tablet   . zoster vaccine live, PF, (ZOSTAVAX) 16109 UNT/0.65ML injection Inject 19,400 Units into the skin once.  . polyethylene glycol powder (GLYCOLAX/MIRALAX) powder Take 255 g by mouth daily.  . [DISCONTINUED] cyanocobalamin 100 MCG tablet Take 100 mcg by mouth daily.  . [DISCONTINUED] oxyCODONE (ROXICODONE) 15 MG immediate release tablet TK 1 T PO Q 4 H PRN P  . [DISCONTINUED] Oxycodone HCl 20 MG TABS 10 mg.    No facility-administered encounter medications on file as of 12/07/2015.   Allergies  Allergen Reactions  . Nsaids Diarrhea and Other (See Comments)    "horrible cramps".  . Sodium Pentobarbital [Pentobarbital] Other (See Comments)    Used as a child in an operation.  MDs told her parents never to let her take this again.  . Codeine Hives and Other (See Comments)    Cramping   . Fentanyl Itching and Other (See Comments)  sweating duragesic patch only; can tolerate IV   Patient Active Problem List   Diagnosis Date Noted  . Left hip pain 12/06/2015  . Solitary pulmonary nodule 09/10/2015  . Unintentional weight loss 09/08/2015  . Constipation 09/08/2015  . Anxiety 08/19/2015  . Arthritis 08/19/2015  . Mechanical complication of internal fixation device such as nail, plate or rod ([place]) [id number]  . Dyspnea 08/19/2015  . Dysphagia 08/19/2015  . Chest pain 08/18/2015  . Hyperventilation 08/18/2015  . Odynophagia 08/18/2015  . 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 Generalized weakness 06/02/2014  . Acute blood loss  anemia 06/02/2014  . Intractable back pain 04/29/2014  . S/P lumbar fusion 04/28/2014  . UTI (lower urinary tract infection) 04/28/2014  . Difficulty in walking(719.7) 02/10/2014  . Decreased muscle strength 02/10/2014  . Obesity (BMI 30-39.9) 06/23/2013  . Depression   . Lumbar spinal stenosis    Social History   Social History  . Marital Status: Married    Spouse Name: N/A  . Number of Children: N/A  . Years of Education: N/A   Occupational History  . Not on file.   Social History Main Topics  . Smoking status: Current Every Day Smoker -- 0.50 packs/day for 50 years    Types: Cigarettes  . Smokeless tobacco: Never Used  . Alcohol Use: No  . Drug Use: No  . Sexual Activity: Not Currently   Other Topics Concern  . Not on file   Social History Narrative    Ms. Slawinski's family history includes COPD in her father; Diabetes in her son; Heart failure in her father; Mental illness in her mother.      Objective:    Filed Vitals:   12/07/15 1046  BP: 90/66  Pulse: 96    Physical Exam   Well-developed elderly white female in no acute distress, pleasant blood pressure 90/66 pulse 96 height 5 foot weight 1:30, BMI 25.2. HEENT; nontraumatic normal cephalic EOMI PERRLA sclera anicteric, Cardiovascular ;regular rate and rhythm with S1-S2 no murmur or gallop, Pulmonary; clear bilaterally, Abdomen ;soft BSsounds are present she is nontender there is no palpable mass or hepatosplenomegaly on Rectal ;exam not done, Extremities; no clubbing cyanosis or edema skin warm and dry, Neuropsych ;mood and affect appropriate     Assessment & Plan:   #1 75 yo female with 30 pound weight loss directly post op after spinal fusion in 07/2015- unable to regain weight thus far Labs and CT reassuring Her BMI is within normal range at this time  I am not overly concerned that this point that she has any underlying process causing weight loss I think she lost weight due to postoperative anorexia  after a difficult surgery. Do not think she needs to regain the weight but rather just maintain current weight. #2  History of adenomatous colon polyps- due for follow-up colonoscopy, we contacted [facility] and they will send records but have her listed as being overdue for follow-up colonoscopy  patient will be scheduled for colonoscopy with Dr. Carlean Purl. Procedure discussed in detail with patient and she is agreeable to proceed  #3 depression- stable   Addendum; patient's records from [facility] were received after her visit. There is copy of colonoscopy done February 2013 per Dr. Vira Agar with finding of 2 sessile polyps , 1 in the cecum measuring 6 mm and one in the rectosigmoid measuring 4 mm. Also noted internal hemorrhoids and sigmoid diverticulosis. Path on the polyps cecal polyp was a tubular adenoma  rectosigmoid polyp hyperplastic. She was recommended for 3 year interval follow-up.  Thinh Cuccaro S Raji Glinski PA-C 12/07/2015   Cc: [name], MD

## 2015-12-09 DIAGNOSIS — M5416 Radiculopathy, lumbar region: Secondary | ICD-10-CM | POA: Diagnosis not present

## 2015-12-09 DIAGNOSIS — M961 Postlaminectomy syndrome, not elsewhere classified: Secondary | ICD-10-CM | POA: Diagnosis not present

## 2015-12-09 DIAGNOSIS — M1612 Unilateral primary osteoarthritis, left hip: Secondary | ICD-10-CM | POA: Diagnosis not present

## 2015-12-16 ENCOUNTER — Telehealth: Payer: Self-pay | Admitting: *Deleted

## 2015-12-16 NOTE — Telephone Encounter (Signed)
This patient signed a release of information form when she saw Nicoletta Ba PA on 12-07-2015.  Received  Records from [place] today 12-16-2015.  Gave to [employer] PA to review. Once reviewed these will be scanned and sent to Medical Records.

## 2015-12-22 ENCOUNTER — Other Ambulatory Visit: Payer: Self-pay | Admitting: Internal Medicine

## 2015-12-22 ENCOUNTER — Other Ambulatory Visit: Payer: Self-pay | Admitting: Family Medicine

## 2015-12-27 ENCOUNTER — Other Ambulatory Visit: Payer: Self-pay

## 2015-12-27 MED ORDER — DULOXETINE HCL 60 MG PO CPEP
60.0000 mg | ORAL_CAPSULE | Freq: Every day | ORAL | Status: DC
Start: 2015-12-27 — End: 2017-02-07

## 2015-12-28 ENCOUNTER — Telehealth: Payer: Self-pay | Admitting: Surgical

## 2015-12-28 DIAGNOSIS — M5115 Intervertebral disc disorders with radiculopathy, thoracolumbar region: Secondary | ICD-10-CM | POA: Diagnosis not present

## 2015-12-28 NOTE — Telephone Encounter (Signed)
Noted  

## 2015-12-28 NOTE — Telephone Encounter (Signed)
Spoke with patient and she clarified that she was taking Duloxetine 60 mg instead of 30 mg. Notified [facility] pharmacy of the change.

## 2016-01-20 DIAGNOSIS — M4326 Fusion of spine, lumbar region: Secondary | ICD-10-CM | POA: Diagnosis not present

## 2016-02-01 ENCOUNTER — Telehealth: Payer: Self-pay | Admitting: Internal Medicine

## 2016-02-01 MED ORDER — POLYETHYLENE GLYCOL 3350 17 GM/SCOOP PO POWD
ORAL | Status: DC
Start: 2016-02-01 — End: 2016-04-20

## 2016-02-01 NOTE — Telephone Encounter (Signed)
Spoke with the patient and she requested we send in miralax for her upcoming prep, confirmed pharmacy.

## 2016-02-03 ENCOUNTER — Encounter: Payer: Commercial Managed Care - HMO | Admitting: Internal Medicine

## 2016-02-07 ENCOUNTER — Encounter: Payer: Commercial Managed Care - HMO | Admitting: Internal Medicine

## 2016-02-07 ENCOUNTER — Telehealth: Payer: Self-pay | Admitting: Internal Medicine

## 2016-02-07 NOTE — Telephone Encounter (Signed)
No charge. 

## 2016-02-21 DIAGNOSIS — M4326 Fusion of spine, lumbar region: Secondary | ICD-10-CM | POA: Diagnosis not present

## 2016-02-21 DIAGNOSIS — M47894 Other spondylosis, thoracic region: Secondary | ICD-10-CM | POA: Diagnosis not present

## 2016-03-06 ENCOUNTER — Encounter: Payer: Self-pay | Admitting: Family Medicine

## 2016-03-06 ENCOUNTER — Ambulatory Visit (INDEPENDENT_AMBULATORY_CARE_PROVIDER_SITE_OTHER): Payer: Commercial Managed Care - HMO | Admitting: Family Medicine

## 2016-03-06 VITALS — BP 100/64 | HR 85 | Temp 98.6°F | Ht 61.0 in | Wt 132.0 lb

## 2016-03-06 DIAGNOSIS — M4806 Spinal stenosis, lumbar region: Secondary | ICD-10-CM

## 2016-03-06 DIAGNOSIS — M48061 Spinal stenosis, lumbar region without neurogenic claudication: Secondary | ICD-10-CM

## 2016-03-06 DIAGNOSIS — R634 Abnormal weight loss: Secondary | ICD-10-CM

## 2016-03-06 DIAGNOSIS — F329 Major depressive disorder, single episode, unspecified: Secondary | ICD-10-CM

## 2016-03-06 DIAGNOSIS — F32A Depression, unspecified: Secondary | ICD-10-CM

## 2016-03-06 DIAGNOSIS — E785 Hyperlipidemia, unspecified: Secondary | ICD-10-CM | POA: Diagnosis not present

## 2016-03-06 NOTE — Assessment & Plan Note (Signed)
Well-controlled. No SI or HI. Continue Cymbalta.

## 2016-03-06 NOTE — Assessment & Plan Note (Signed)
Weight is actually slightly improved. She will continue to monitor her weight. Discussed diet and exercise.

## 2016-03-06 NOTE — Progress Notes (Signed)
Patient ID: DEBAR PLATE, female   DOB: 1941-03-23, 75 y.o.   MRN: [id number]  [name], MD Phone: [phone]  RULA KENISTON is a 75 y.o. female who presents today for follow-up.  Weight loss: Patient notes this is been stable since her last visit. She is actually up 2 pounds. She saw GI and they felt as though it was related to her post surgical difficulties following back surgery. She's been eating well. Trying to eat healthier with eating more vegetables. No abdominal pain. No blood in her stool. She feels well overall.  Depression: She notes this is improved. She notes there is less stress from her granddaughter. She is taking Cymbalta. No SI or HI. PHQ 9 score of 2.  Patient is followed by orthopedic surgery for her back pain. They're considering doing fluoroscopy guided injections. She sees him tomorrow. Pain is in her lower back. Hurts more with doing physical activities. No radiation. No numbness or weakness. No fevers. No loss of bowel or bladder function, saddle anesthesia, or history of cancer.  HYPERLIPIDEMIA Symptoms Chest pain on exertion:  No   Leg claudication:   No Medications: Compliance- Fish oil Right upper quadrant pain- no  Muscle aches- no  PMH: Smoker   ROS see history of present illness  Objective  Physical Exam Filed Vitals:   03/06/16 1054  BP: 100/64  Pulse: 85  Temp: 98.6 F (37 C)    BP Readings from Last 3 Encounters:  03/06/16 100/64  12/07/15 90/66  12/06/15 118/66   Wt Readings from Last 3 Encounters:  03/06/16 132 lb (59.875 kg)  12/07/15 130 lb 4 oz (59.081 kg)  12/06/15 130 lb 9.6 oz (59.24 kg)    Physical Exam  Constitutional: She is well-developed, well-nourished, and in no distress.  HENT:  Head: Normocephalic and atraumatic.  Cardiovascular: Normal rate, regular rhythm and normal heart sounds.   Pulmonary/Chest: Effort normal and breath sounds normal.  Abdominal: Soft. Bowel sounds are normal. She exhibits no  distension. There is no tenderness. There is no rebound and no guarding.  Musculoskeletal:  No midline spine tenderness, no midline spine step-off, no muscular back tenderness  Neurological: She is alert. Gait normal.  5 out of 5 strength bilateral quads, hamstrings, plantar flexion, and dorsiflexion, sensation to light touch intact bilaterally lower extremities, absent patellar reflexes  Skin: Skin is warm and dry. She is not diaphoretic.  Psychiatric: Mood and affect normal.     Assessment/Plan: Please see individual problem list.  Depression Well-controlled. No SI or HI. Continue Cymbalta.  Lumbar spinal stenosis Followed by orthopedic surgery. Sees them tomorrow. Is stable. Neurologically intact. No red flags. Given return precautions.  Unintentional weight loss Weight is actually slightly improved. She will continue to monitor her weight. Discussed diet and exercise.  Hyperlipidemia Recheck lipid panel and CMP today.    Orders Placed This Encounter  Procedures  . Comp Met (CMET)    Standing Status: Future     Number of Occurrences:      Standing Expiration Date: 03/06/2017  . Lipid Profile    Standing Status: Future     Number of Occurrences:      Standing Expiration Date: 03/06/2017    [name], MD [place]

## 2016-03-06 NOTE — Assessment & Plan Note (Signed)
Recheck lipid panel and CMP today.  

## 2016-03-06 NOTE — Assessment & Plan Note (Signed)
Followed by orthopedic surgery. Sees them tomorrow. Is stable. Neurologically intact. No red flags. Given return precautions.

## 2016-03-06 NOTE — Progress Notes (Signed)
Pre visit review using our clinic review tool, if applicable. No additional management support is needed unless otherwise documented below in the visit note. 

## 2016-03-06 NOTE — Patient Instructions (Signed)
Nice to see you. Please continue to monitor your weight and diet. We will continue your Cymbalta as well. Please keep follow-up with the orthopedic surgeon. Please schedule lab appointment for fasting labs. If you develop numbness, weakness, loss of bowel or bladder function, numbness between her legs, fevers, or any new or changing symptoms please seek medical attention.

## 2016-03-07 DIAGNOSIS — M4326 Fusion of spine, lumbar region: Secondary | ICD-10-CM | POA: Diagnosis not present

## 2016-03-07 DIAGNOSIS — Z79891 Long term (current) use of opiate analgesic: Secondary | ICD-10-CM | POA: Diagnosis not present

## 2016-03-07 DIAGNOSIS — M5115 Intervertebral disc disorders with radiculopathy, thoracolumbar region: Secondary | ICD-10-CM | POA: Diagnosis not present

## 2016-03-07 DIAGNOSIS — G894 Chronic pain syndrome: Secondary | ICD-10-CM | POA: Diagnosis not present

## 2016-03-07 DIAGNOSIS — Z79899 Other long term (current) drug therapy: Secondary | ICD-10-CM | POA: Diagnosis not present

## 2016-03-10 ENCOUNTER — Other Ambulatory Visit: Payer: Commercial Managed Care - HMO

## 2016-03-16 ENCOUNTER — Encounter: Payer: Self-pay | Admitting: Internal Medicine

## 2016-03-16 ENCOUNTER — Telehealth: Payer: Self-pay | Admitting: Internal Medicine

## 2016-03-16 ENCOUNTER — Ambulatory Visit (INDEPENDENT_AMBULATORY_CARE_PROVIDER_SITE_OTHER): Payer: Commercial Managed Care - HMO | Admitting: Internal Medicine

## 2016-03-16 VITALS — BP 122/60 | HR 90 | Ht 60.0 in | Wt 128.0 lb

## 2016-03-16 DIAGNOSIS — R0600 Dyspnea, unspecified: Secondary | ICD-10-CM

## 2016-03-16 DIAGNOSIS — J449 Chronic obstructive pulmonary disease, unspecified: Secondary | ICD-10-CM

## 2016-03-16 MED ORDER — AZITHROMYCIN 250 MG PO TABS: ORAL_TABLET | ORAL | Status: DC

## 2016-03-16 MED ORDER — ALBUTEROL SULFATE HFA 108 (90 BASE) MCG/ACT IN AERS: 2.0000 | INHALATION_SPRAY | Freq: Four times a day (QID) | RESPIRATORY_TRACT | Status: DC | PRN

## 2016-03-16 MED ORDER — PREDNISONE 50 MG PO TABS: 20.0000 mg | ORAL_TABLET | Freq: Every day | ORAL | Status: DC

## 2016-03-16 MED ORDER — ALBUTEROL SULFATE HFA 108 (90 BASE) MCG/ACT IN AERS: 2.0000 | INHALATION_SPRAY | RESPIRATORY_TRACT | Status: DC | PRN

## 2016-03-16 NOTE — Telephone Encounter (Signed)
Spoke with patient - given Prednisone 25mg  - needs clarification of dosing instructions. Pt was advised to take Pred x 10 days but pt is questioning the dose and time frame and if she is to taper down.  AVS says 50mg  x 10 days, Rx is for 50mg  tablets (take 1/2 tablet)  After 10 days is patient to stop Prednisone or is she going to taper down? 1.will start Prednisone 50 mg daily for 10 days 2.start z pack 3.albuterol as needed 4.smoking cessation strongly advised 5.will need PFT at next visit 6.CT chest in 1 month  Follow up in 1 month after tests completed  Please advise Dr Mortimer Fries, pt aware that it will be 03/17/16 before she gets a call back Will send to [facility] and Dr Mortimer Fries.

## 2016-03-16 NOTE — Telephone Encounter (Signed)
Patient called back to get an answer about her dosage questions- She would like a call back at [phone]

## 2016-03-16 NOTE — Progress Notes (Signed)
Patient ID: Brittany Mays, female   DOB: 08-10-1941, 75 y.o.   MRN: [id number]

## 2016-03-16 NOTE — Telephone Encounter (Signed)
Pt has a question regarding the doseage for prednisone. Please call.

## 2016-03-16 NOTE — Progress Notes (Signed)
[facility] Consultation      Date: 03/16/2016,   MRN# [id number] Brittany Mays [birth year] Code Status:  Hosp day:@LENGTHOFSTAYDAYS @ Referring MD: @ATDPROV @     PCP:      AdmissionWeight: 128 lb (58.06 kg)                 CurrentWeight: 128 lb (58.06 kg) Brittany Mays is a 75 y.o. old female seen in consultation for lung nodule at the request of Dr. Caryl Bis.    CHIEF COMPLAINT:  Wheezing, cough, chest congestion    HISTORY OF PRESENT ILLNESS   Patient here for acute visit for acute SOB, wheezing and productive cough Patient symptoms started several days ago Findings suggest underlying COPD-will need PFT     Current Medication:  Current outpatient prescriptions:  .  DULoxetine (CYMBALTA) 60 MG capsule, Take 1 capsule (60 mg total) by mouth daily., Disp: 90 capsule, Rfl: 3 .  fish oil-omega-3 fatty acids 1000 MG capsule, Take 1 g by mouth daily. , Disp: , Rfl:  .  Melatonin 3 MG CAPS, Take 1 capsule (3 mg total) by mouth at bedtime as needed (insomnia). (Patient taking differently: Take 6 mg by mouth at bedtime as needed (insomnia). ), Disp: 30 capsule, Rfl: 0 .  Multiple Vitamins-Minerals (WOMENS MULTI) CAPS, Take one tablet once daily, Disp: , Rfl:  .  Oxycodone HCl 10 MG TABS, TK 1 T PO Q 6 H PRN, Disp: , Rfl:  .  polyethylene glycol powder (GLYCOLAX/MIRALAX) powder, Use as directed to prep for colonoscopy, Disp: 238 g, Rfl: 0 .  vitamin B-12 (CYANOCOBALAMIN) 100 MCG tablet, , Disp: , Rfl:  .  zoster vaccine live, PF, (ZOSTAVAX) 13086 UNT/0.65ML injection, Inject 19,400 Units into the skin once., Disp: 1 each, Rfl: 0 .  EMBEDA 20-0.8 MG CPCR, TK 1 C PO Q 12 H, Disp: , Rfl: 0    ALLERGIES   Nsaids; Sodium pentobarbital; Codeine; and Fentanyl     REVIEW OF SYSTEMS   Review of Systems  Constitutional: Negative for fever, chills, weight loss and malaise/fatigue.  HENT: Positive for congestion.   Respiratory: Positive for cough, sputum  production, shortness of breath and wheezing. Negative for hemoptysis.   Cardiovascular: Negative for chest pain, palpitations, orthopnea and leg swelling.  Gastrointestinal: Negative for heartburn, nausea and constipation.  Genitourinary: Negative.   Neurological: Negative.   Psychiatric/Behavioral: Negative for depression. The patient is nervous/anxious.   All other systems reviewed and are negative.    VS: BP 122/60 mmHg  Pulse 90  Ht 5' (1.524 m)  Wt 128 lb (58.06 kg)  BMI 25.00 kg/m2  SpO2 100%     PHYSICAL EXAM  Physical Exam  Constitutional: She is oriented to person, place, and time. She appears well-developed and well-nourished.  HENT:  Head: Normocephalic and atraumatic.  Mouth/Throat: No oropharyngeal exudate.  Cardiovascular: Normal rate and regular rhythm.   No murmur heard. Pulmonary/Chest: Effort normal and breath sounds normal. No stridor. No respiratory distress. She has no wheezes.  Musculoskeletal: Normal range of motion. She exhibits no edema.  Neurological: She is alert and oriented to person, place, and time.  Skin: Skin is warm and dry.       ASSESSMENT/PLAN   75 yo white female with acute COPD exacerbation from acute bronchitis. Also with incidental RT lung nodule  1.will start Prednisone 50 mg daily for 10 days 2.start z pack 3.albuterol as needed 4.smoking cessation strongly advised 5.will need PFT at next visit 6.CT  chest in 1 month  Follow up in 1 month after tests completed   I have personally obtained a history, examined the patient, evaluated laboratory and independently reviewed imaging results, formulated the assessment and plan and placed orders.  The Patient requires high complexity decision making for assessment and support, frequent evaluation and titration of therapies, application of advanced monitoring technologies and extensive interpretation of multiple databases.   Patient/Family are satisfied with Plan of action and  management. All questions answered  Corrin Parker, M.D.  [facility]  Medical Director [place] Director [facility] Cardio-Pulmonary Department

## 2016-03-16 NOTE — Telephone Encounter (Signed)
As Misty is off this pm, will route to [facility] Triage for assistance.

## 2016-03-16 NOTE — Patient Instructions (Signed)
Chronic Obstructive Pulmonary Disease Chronic obstructive pulmonary disease (COPD) is a common lung condition in which airflow from the lungs is limited. COPD is a general term that can be used to describe many different lung problems that limit airflow, including both chronic bronchitis and emphysema. If you have COPD, your lung function will probably never return to normal, but there are measures you can take to improve lung function and make yourself feel better. CAUSES   Smoking (common).  Exposure to secondhand smoke.  Genetic problems.  Chronic inflammatory lung diseases or recurrent infections. SYMPTOMS  Shortness of breath, especially with physical activity.  Deep, persistent (chronic) cough with a large amount of thick mucus.  Wheezing.  Rapid breaths (tachypnea).  Gray or bluish discoloration (cyanosis) of the skin, especially in your fingers, toes, or lips.  Fatigue.  Weight loss.  Frequent infections or episodes when breathing symptoms become much worse (exacerbations).  Chest tightness. DIAGNOSIS Your health care provider will take a medical history and perform a physical examination to diagnose COPD. Additional tests for COPD may include:  Lung (pulmonary) function tests.  Chest X-ray.  CT scan.  Blood tests. TREATMENT  Treatment for COPD may include:  Inhaler and nebulizer medicines. These help manage the symptoms of COPD and make your breathing more comfortable.  Supplemental oxygen. Supplemental oxygen is only helpful if you have a low oxygen level in your blood.  Exercise and physical activity. These are beneficial for nearly all people with COPD.  Lung surgery or transplant.  Nutrition therapy to gain weight, if you are underweight.  Pulmonary rehabilitation. This may involve working with a team of health care providers and specialists, such as respiratory, occupational, and physical therapists. HOME CARE INSTRUCTIONS  Take all medicines  (inhaled or pills) as directed by your health care provider.  Avoid over-the-counter medicines or cough syrups that dry up your airway (such as antihistamines) and slow down the elimination of secretions unless instructed otherwise by your health care provider.  If you are a smoker, the most important thing that you can do is stop smoking. Continuing to smoke will cause further lung damage and breathing trouble. Ask your health care provider for help with quitting smoking. He or she can direct you to community resources or hospitals that provide support.  Avoid exposure to irritants such as smoke, chemicals, and fumes that aggravate your breathing.  Use oxygen therapy and pulmonary rehabilitation if directed by your health care provider. If you require home oxygen therapy, ask your health care provider whether you should purchase a pulse oximeter to measure your oxygen level at home.  Avoid contact with individuals who have a contagious illness.  Avoid extreme temperature and humidity changes.  Eat healthy foods. Eating smaller, more frequent meals and resting before meals may help you maintain your strength.  Stay active, but balance activity with periods of rest. Exercise and physical activity will help you maintain your ability to do things you want to do.  Preventing infection and hospitalization is very important when you have COPD. Make sure to receive all the vaccines your health care provider recommends, especially the pneumococcal and influenza vaccines. Ask your health care provider whether you need a pneumonia vaccine.  Learn and use relaxation techniques to manage stress.  Learn and use controlled breathing techniques as directed by your health care provider. Controlled breathing techniques include:  Pursed lip breathing. Start by breathing in (inhaling) through your nose for 1 second. Then, purse your lips as if you were   going to whistle and breathe out (exhale) through the  pursed lips for 2 seconds.  Diaphragmatic breathing. Start by putting one hand on your abdomen just above your waist. Inhale slowly through your nose. The hand on your abdomen should move out. Then purse your lips and exhale slowly. You should be able to feel the hand on your abdomen moving in as you exhale.  Learn and use controlled coughing to clear mucus from your lungs. Controlled coughing is a series of short, progressive coughs. The steps of controlled coughing are: 1. Lean your head slightly forward. 2. Breathe in deeply using diaphragmatic breathing. 3. Try to hold your breath for 3 seconds. 4. Keep your mouth slightly open while coughing twice. 5. Spit any mucus out into a tissue. 6. Rest and repeat the steps once or twice as needed. SEEK MEDICAL CARE IF:  You are coughing up more mucus than usual.  There is a change in the color or thickness of your mucus.  Your breathing is more labored than usual.  Your breathing is faster than usual. SEEK IMMEDIATE MEDICAL CARE IF:  You have shortness of breath while you are resting.  You have shortness of breath that prevents you from:  Being able to talk.  Performing your usual physical activities.  You have chest pain lasting longer than 5 minutes.  Your skin color is more cyanotic than usual.  You measure low oxygen saturations for longer than 5 minutes with a pulse oximeter. MAKE SURE YOU:  Understand these instructions.  Will watch your condition.  Will get help right away if you are not doing well or get worse.   This information is not intended to replace advice given to you by your health care provider. Make sure you discuss any questions you have with your health care provider.   Document Released: 08/02/2005 Document Revised: 11/13/2014 Document Reviewed: 06/19/2013 Elsevier Interactive Patient Education 2016 Elsevier Inc.  

## 2016-03-17 ENCOUNTER — Telehealth: Payer: Self-pay

## 2016-03-17 NOTE — Telephone Encounter (Signed)
Error

## 2016-03-17 NOTE — Telephone Encounter (Signed)
Called and spoke with pt husband he states pt was sleeping due to being sick and will have pt return call once she wakes up in regards to prednisone dosage

## 2016-03-17 NOTE — Telephone Encounter (Signed)
Spoke with KK he confirmed that pt should take 1 tab (50mg ) for 10 days then stop. Pt informed and voiced understanding. Nothing further needed.

## 2016-03-21 ENCOUNTER — Telehealth: Payer: Self-pay | Admitting: Internal Medicine

## 2016-03-21 NOTE — Telephone Encounter (Signed)
Please advise on the message below in regards to the Prednisone. She is taking 25mg  daily.

## 2016-03-21 NOTE — Telephone Encounter (Signed)
Change to Prednisone 10 mg daily

## 2016-03-21 NOTE — Telephone Encounter (Signed)
Pt states her Prednisone has her "wired and cant think or sleep". Please call and advise what she should do.

## 2016-03-21 NOTE — Telephone Encounter (Signed)
Pt states she had been taking 50 mg daily instead of 25 mg daily. Informed pt after speaking with DK for the pt to take 25mg  daily to complete the 10 day course. Pt agreed. Nothing further needed.

## 2016-03-30 ENCOUNTER — Encounter: Payer: Commercial Managed Care - HMO | Admitting: Internal Medicine

## 2016-04-12 DIAGNOSIS — G894 Chronic pain syndrome: Secondary | ICD-10-CM | POA: Diagnosis not present

## 2016-04-12 DIAGNOSIS — M4326 Fusion of spine, lumbar region: Secondary | ICD-10-CM | POA: Diagnosis not present

## 2016-04-14 ENCOUNTER — Ambulatory Visit
Admission: RE | Admit: 2016-04-14 | Discharge: 2016-04-14 | Disposition: A | Discharge status: Home / Self Care | Payer: Commercial Managed Care - HMO | Source: Ambulatory Visit | Attending: Internal Medicine | Admitting: Internal Medicine

## 2016-04-14 ENCOUNTER — Ambulatory Visit (INDEPENDENT_AMBULATORY_CARE_PROVIDER_SITE_OTHER): Payer: Commercial Managed Care - HMO | Admitting: *Deleted

## 2016-04-14 ENCOUNTER — Ambulatory Visit: Payer: Commercial Managed Care - HMO

## 2016-04-14 DIAGNOSIS — R918 Other nonspecific abnormal finding of lung field: Secondary | ICD-10-CM | POA: Insufficient documentation

## 2016-04-14 DIAGNOSIS — F17200 Nicotine dependence, unspecified, uncomplicated: Secondary | ICD-10-CM | POA: Diagnosis not present

## 2016-04-14 DIAGNOSIS — R0600 Dyspnea, unspecified: Secondary | ICD-10-CM

## 2016-04-14 DIAGNOSIS — J449 Chronic obstructive pulmonary disease, unspecified: Secondary | ICD-10-CM

## 2016-04-14 DIAGNOSIS — I712 Thoracic aortic aneurysm, without rupture: Secondary | ICD-10-CM | POA: Diagnosis not present

## 2016-04-14 LAB — PULMONARY FUNCTION TEST
DL/VA % pred: 94 %
DL/VA: 3.99 ml/min/mmHg/L
DLCO unc % pred: 78 %
DLCO unc: 14.85 ml/min/mmHg
FEF 25-75 PRE: 1.2 L/s
FEF 25-75 Post: 0.81 L/sec
FEF2575-%CHANGE-POST: -32 %
FEF2575-%PRED-POST: 61 %
FEF2575-%PRED-PRE: 90 %
FEV1-%Change-Post: -8 %
FEV1-%PRED-POST: 88 %
FEV1-%Pred-Pre: 96 %
FEV1-POST: 1.47 L
FEV1-PRE: 1.6 L
FEV1FVC-%CHANGE-POST: -1 %
FEV1FVC-%Pred-Pre: 100 %
FEV6-%CHANGE-POST: -6 %
FEV6-%Pred-Post: 93 %
FEV6-%Pred-Pre: 100 %
FEV6-PRE: 2.13 L
FEV6-Post: 1.99 L
FEV6FVC-%Change-Post: 0 %
FEV6FVC-%PRED-PRE: 105 %
FEV6FVC-%Pred-Post: 106 %
FVC-%CHANGE-POST: -7 %
FVC-%PRED-PRE: 95 %
FVC-%Pred-Post: 88 %
FVC-POST: 1.99 L
FVC-PRE: 2.14 L
POST FEV1/FVC RATIO: 74 %
PRE FEV1/FVC RATIO: 75 %
Post FEV6/FVC ratio: 100 %
Pre FEV6/FVC Ratio: 100 %

## 2016-04-14 NOTE — Progress Notes (Signed)
PFT performed today with nitrogen washout. 

## 2016-04-18 ENCOUNTER — Ambulatory Visit: Payer: Commercial Managed Care - HMO | Admitting: Internal Medicine

## 2016-04-20 ENCOUNTER — Encounter: Payer: Self-pay | Admitting: Internal Medicine

## 2016-04-20 ENCOUNTER — Ambulatory Visit (INDEPENDENT_AMBULATORY_CARE_PROVIDER_SITE_OTHER): Payer: Commercial Managed Care - HMO | Admitting: Internal Medicine

## 2016-04-20 VITALS — BP 118/70 | HR 91 | Ht 60.0 in | Wt 128.0 lb

## 2016-04-20 DIAGNOSIS — I712 Thoracic aortic aneurysm, without rupture, unspecified: Secondary | ICD-10-CM

## 2016-04-20 DIAGNOSIS — R911 Solitary pulmonary nodule: Secondary | ICD-10-CM | POA: Diagnosis not present

## 2016-04-20 DIAGNOSIS — J45909 Unspecified asthma, uncomplicated: Secondary | ICD-10-CM

## 2016-04-20 NOTE — Patient Instructions (Addendum)
Follow up visit CT chest with contrast in 6 months Albuterol as needed SMoking cessation strongly advised Referal for Dr. Genevive Bi    Bronchospasm, Adult A bronchospasm is a spasm or tightening of the airways going into the lungs. During a bronchospasm breathing becomes more difficult because the airways get smaller. When this happens there can be coughing, a whistling sound when breathing (wheezing), and difficulty breathing. Bronchospasm is often associated with asthma, but not all patients who experience a bronchospasm have asthma. CAUSES  A bronchospasm is caused by inflammation or irritation of the airways. The inflammation or irritation may be triggered by:   Allergies (such as to animals, pollen, food, or mold). Allergens that cause bronchospasm may cause wheezing immediately after exposure or many hours later.   Infection. Viral infections are believed to be the most common cause of bronchospasm.   Exercise.   Irritants (such as pollution, cigarette smoke, strong odors, aerosol sprays, and paint fumes).   Weather changes. Winds increase molds and pollens in the air. Rain refreshes the air by washing irritants out. Cold air may cause inflammation.   Stress and emotional upset.  SIGNS AND SYMPTOMS   Wheezing.   Excessive nighttime coughing.   Frequent or severe coughing with a simple cold.   Chest tightness.   Shortness of breath.  DIAGNOSIS  Bronchospasm is usually diagnosed through a history and physical exam. Tests, such as chest X-rays, are sometimes done to look for other conditions. TREATMENT   Inhaled medicines can be given to open up your airways and help you breathe. The medicines can be given using either an inhaler or a nebulizer machine.  Corticosteroid medicines may be given for severe bronchospasm, usually when it is associated with asthma. HOME CARE INSTRUCTIONS   Always have a plan prepared for seeking medical care. Know when to call your health  care provider and local emergency services (911 in the [place] Know where you can access local emergency care.  Only take medicines as directed by your health care provider.  If you were prescribed an inhaler or nebulizer machine, ask your health care provider to explain how to use it correctly. Always use a spacer with your inhaler if you were given one.  It is necessary to remain calm during an attack. Try to relax and breathe more slowly.  Control your home environment in the following ways:   Change your heating and air conditioning filter at least once a month.   Limit your use of fireplaces and wood stoves.  Do not smoke and do not allow smoking in your home.   Avoid exposure to perfumes and fragrances.   Get rid of pests (such as roaches and mice) and their droppings.   Throw away plants if you see mold on them.   Keep your house clean and dust free.   Replace carpet with wood, tile, or vinyl flooring. Carpet can trap dander and dust.   Use allergy-proof pillows, mattress covers, and box spring covers.   Wash bed sheets and blankets every week in hot water and dry them in a dryer.   Use blankets that are made of polyester or cotton.   Wash hands frequently. SEEK MEDICAL CARE IF:   You have muscle aches.   You have chest pain.   The sputum changes from clear or white to yellow, green, gray, or bloody.   The sputum you cough up gets thicker.   There are problems that may be related to the medicine you are  given, such as a rash, itching, swelling, or trouble breathing.  SEEK IMMEDIATE MEDICAL CARE IF:   You have worsening wheezing and coughing even after taking your prescribed medicines.   You have increased difficulty breathing.   You develop severe chest pain. MAKE SURE YOU:   Understand these instructions.  Will watch your condition.  Will get help right away if you are not doing well or get worse.   This information is not intended  to replace advice given to you by your health care provider. Make sure you discuss any questions you have with your health care provider.   Document Released: 10/26/2003 Document Revised: 11/13/2014 Document Reviewed: 04/14/2013 Elsevier Interactive Patient Education [employer].

## 2016-04-20 NOTE — Progress Notes (Signed)
[facility] Consultation      Date: 04/20/2016,   MRN# [id number] RADHA LAGER 1941/08/30 Code Status:  Hosp day:@LENGTHOFSTAYDAYS @ Referring MD: @ATDPROV @     PCP:      AdmissionWeight: 128 lb (58.06 kg)                 CurrentWeight: 128 lb (58.06 kg) Brittany Mays is a 75 y.o. old female seen in consultation for lung nodule at the request of Dr. Caryl Bis.    CHIEF COMPLAINT:  Wheezing, cough, chest congestion -resolved    HISTORY OF PRESENT ILLNESS  Follow up for PFT and repeat CT chest-results reviewed with patient Patient has no resp symptoms at this time, recieved prednisone and abx at last OV Still smokes Smoking cessation advised  Ct chest results and PFT's reviewed with patient No signs of infection at this time     Current Medication:  Current outpatient prescriptions:  .  albuterol (PROVENTIL HFA;VENTOLIN HFA) 108 (90 Base) MCG/ACT inhaler, Inhale 2 puffs into the lungs every 6 (six) hours as needed for wheezing or shortness of breath., Disp: 1 Inhaler, Rfl: 2 .  albuterol (PROVENTIL HFA;VENTOLIN HFA) 108 (90 Base) MCG/ACT inhaler, Inhale 2 puffs into the lungs every 4 (four) hours as needed for wheezing or shortness of breath., Disp: 1 Inhaler, Rfl: 6 .  DULoxetine (CYMBALTA) 60 MG capsule, Take 1 capsule (60 mg total) by mouth daily., Disp: 90 capsule, Rfl: 3 .  fish oil-omega-3 fatty acids 1000 MG capsule, Take 1 g by mouth daily. , Disp: , Rfl:  .  Melatonin 3 MG CAPS, Take 1 capsule (3 mg total) by mouth at bedtime as needed (insomnia). (Patient taking differently: Take 6 mg by mouth at bedtime as needed (insomnia). ), Disp: 30 capsule, Rfl: 0 .  Multiple Vitamins-Minerals (WOMENS MULTI) CAPS, Take one tablet once daily, Disp: , Rfl:  .  Oxycodone HCl 10 MG TABS, TK 1 T PO Q 6 H PRN, Disp: , Rfl:  .  vitamin B-12 (CYANOCOBALAMIN) 100 MCG tablet, , Disp: , Rfl:  .  zoster vaccine live, PF, (ZOSTAVAX) 09811 UNT/0.65ML injection,  Inject 19,400 Units into the skin once., Disp: 1 each, Rfl: 0    ALLERGIES   Nsaids; Sodium pentobarbital; Codeine; and Fentanyl     REVIEW OF SYSTEMS   Review of Systems  Constitutional: Negative for fever, chills, weight loss and malaise/fatigue.  HENT: Positive for congestion.   Respiratory: Positive for cough, sputum production, shortness of breath and wheezing. Negative for hemoptysis.   Cardiovascular: Negative for chest pain, palpitations, orthopnea and leg swelling.  Gastrointestinal: Negative for heartburn, nausea and constipation.  Genitourinary: Negative.   Neurological: Negative.   Psychiatric/Behavioral: Negative for depression. The patient is nervous/anxious.   All other systems reviewed and are negative.    VS: BP 118/70 mmHg  Pulse 91  Ht 5' (1.524 m)  Wt 128 lb (58.06 kg)  BMI 25.00 kg/m2  SpO2 90%     PHYSICAL EXAM  Physical Exam  Constitutional: She is oriented to person, place, and time. She appears well-developed and well-nourished.  HENT:  Head: Normocephalic and atraumatic.  Mouth/Throat: No oropharyngeal exudate.  Cardiovascular: Normal rate and regular rhythm.   No murmur heard. Pulmonary/Chest: Effort normal and breath sounds normal. No stridor. No respiratory distress. She has no wheezes.  Musculoskeletal: Normal range of motion. She exhibits no edema.  Neurological: She is alert and oriented to person, place, and time.  Skin: Skin is warm  and dry.    Ct chest 04/2016 images reviewed 04/20/2016  IMPRESSION: Stable right upper lobe pulmonary nodules, largest measuring 4 mm. Non-contrast chest CT can be considered in 12 months given patient's known risk factor of smoking. This recommendation follows the consensus statement: Guidelines for Management of Incidental Pulmonary Nodules Detected on CT Images:From the Fleischner Society 2017; published online before print (10.1148/radiol.SG:5268862).  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 Recommend annual imaging followup by CTA. This recommendation follows 2010 ACCF/AHA/AATS/ACR/ASA/SCA/SCAI/SIR/STS/SVM Guidelines for the Diagnosis and Management of Patients with Thoracic Aortic Disease. Circulation. 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 Patient has intermittent reactive airways disease also with 4 Cm ascending thoracic aortic aneurysm   1.albuterol as needed 2.smoking cessation strongly advised 3.referal for Dr. Genevive Bi for Thoracic aneurysm   Follow up in 6 month with repeat CT chest with contrast   I have personally obtained a history, examined the patient, evaluated laboratory and independently reviewed imaging results, formulated the assessment and plan and placed orders.  The Patient requires high complexity decision making for assessment and support, frequent evaluation and titration of therapies, application of advanced monitoring technologies and extensive interpretation of multiple databases.   Patient/Family are satisfied with Plan of action and management. All questions answered  Corrin Parker, M.D.  [facility]  Medical Director [place] Director [facility] Cardio-Pulmonary Department

## 2016-04-21 NOTE — Telephone Encounter (Signed)
error 

## 2016-04-25 ENCOUNTER — Telehealth: Payer: Self-pay

## 2016-04-25 ENCOUNTER — Encounter: Payer: Self-pay | Admitting: Cardiothoracic Surgery

## 2016-04-25 ENCOUNTER — Ambulatory Visit (INDEPENDENT_AMBULATORY_CARE_PROVIDER_SITE_OTHER): Payer: Commercial Managed Care - HMO | Admitting: Cardiothoracic Surgery

## 2016-04-25 VITALS — BP 136/79 | HR 76 | Temp 97.8°F | Resp 22 | Ht 60.0 in | Wt 129.4 lb

## 2016-04-25 DIAGNOSIS — R911 Solitary pulmonary nodule: Secondary | ICD-10-CM | POA: Diagnosis not present

## 2016-04-25 NOTE — Progress Notes (Signed)
Patient ID: Brittany Mays, female   DOB: 1941/08/30, 75 y.o.   MRN: [id number]  Chief Complaint  Patient presents with  . Lung Lesion    Right Upper Lobe    Referred By Dr. Stoney Bang Reason for Referral right upper lobe lung nodule and ascending aortic aneurysm  HPI Location, Quality, Duration, Severity, Timing, Context, Modifying Factors, Associated Signs and Symptoms.  Brittany Mays is a 75 y.o. female.  She is a lifelong smoker. She states that she has seen Dr. Stoney Bang where a CT scan was done showing a right upper lobe nodule. This have been stable over short course of time but there was present also a 4 cm asked aortic aneurysm. She states she has never been told she's had any problems with her aorta in the past. She is a retired Equities trader. She smokes 1-2 packs a day she also has a history of chronic back pain secondary to multiple back surgeries and is currently taking oxycodone on and gabapentin for it. She had a 30 pound weight loss after her most recent back surgery but has had none over the last year. She did have some pulmonary function studies done back in June 2017 which were essentially normal. She would like to stop smoking but states she's quite nervous about the possible underlying diagnosis of an aortic aneurysm.   Past Medical History  Diagnosis Date  . Depression   . Anxiety   . Insomnia   . Pain   . Cataract   . Osteoarthritis of both knees     s/p knee replacements  . Lumbar spinal stenosis     s/p laminectomy  . Hyperparathyroidism, primary ([place])     s/p parathyroidectomy  . Rectal prolapse     s/p repair  . Kidney stone     Past Surgical History  Procedure Laterality Date  . Abdominal hysterectomy    . Knee surgery Bilateral 1990's nd 2002    Knee Replacements  . Spine surgery    . Parathyroidectomy  1990's  . Colonoscopy  2013  . Appendectomy    . Breast lumpectomy      Reports this is benign  . Tonsillectomy    . Back surgery  07-2015    major  surgery    Family History  Problem Relation Age of Onset  . COPD Father   . Heart failure Father   . Diabetes Son   . Mental illness Mother     Social History Social History  Substance Use Topics  . Smoking status: Current Every Day Smoker -- 0.50 packs/day for 50 years    Types: Cigarettes  . Smokeless tobacco: Never Used  . Alcohol Use: No    Allergies  Allergen Reactions  . Nsaids Diarrhea and Other (See Comments)    "horrible cramps".  . Pentothal [Thiopental] Other (See Comments)    Had as a child, and was told to never take again  . Fentanyl Itching and Other (See Comments)    Sweating, rash, dizziness duragesic patch only; can tolerate IV    Current Outpatient Prescriptions  Medication Sig Dispense Refill  . albuterol (PROVENTIL HFA;VENTOLIN HFA) 108 (90 Base) MCG/ACT inhaler Inhale 2 puffs into the lungs every 6 (six) hours as needed for wheezing or shortness of breath. 1 Inhaler 2  . DULoxetine (CYMBALTA) 60 MG capsule Take 1 capsule (60 mg total) by mouth daily. 90 capsule 3  . fish oil-omega-3 fatty acids 1000 MG capsule Take 1 g by mouth  daily.     . Melatonin 3 MG CAPS Take 1 capsule (3 mg total) by mouth at bedtime as needed (insomnia). (Patient taking differently: Take 5 mg by mouth at bedtime as needed (insomnia). ) 30 capsule 0  . Multiple Vitamins-Minerals (WOMENS MULTI) CAPS Take one tablet once daily    . Oxycodone HCl 10 MG TABS TK 1 T PO Q 6 H PRN    . vitamin B-12 (CYANOCOBALAMIN) 100 MCG tablet Take 100 mcg by mouth daily.      No current facility-administered medications for this visit.      Review of Systems A complete review of systems was asked and was negative except for the following positive findingsWeight loss, irregular heartbeat, joint pain.  Blood pressure 136/79, pulse 76, temperature 97.8 F (36.6 C), temperature source Oral, resp. rate 22, height 5' (1.524 m), weight 129 lb 6.4 oz (58.695 kg), SpO2 95 %.  Physical  Exam CONSTITUTIONAL:  Pleasant, well-developed, well-nourished, and in no acute distress. EYES: Pupils equal and reactive to light, Sclera non-icteric EARS, NOSE, MOUTH AND THROAT:  The oropharynx was clear.  Dentition is good repair.  Oral mucosa pink and moist. LYMPH NODES:  Lymph nodes in the neck and axillae were normal RESPIRATORY:  Lungs demonstrated scattered wheezes throughout. The lung sounds were distant bilaterally..  Normal respiratory effort without pathologic use of accessory muscles of respiration CARDIOVASCULAR: Heart was regular without murmurs.  There were no carotid bruits. GI: The abdomen was soft, nontender, and nondistended. There were no palpable masses. There was no hepatosplenomegaly. There were normal bowel sounds in all quadrants. GU:  Rectal deferred.   MUSCULOSKELETAL:  Normal muscle strength and tone.  No clubbing or cyanosis.   SKIN:  There were no pathologic skin lesions.  There were no nodules on palpation. NEUROLOGIC:  Sensation is normal.  Cranial nerves are grossly intact. PSYCH:  Oriented to person, place and time.  Mood and affect are normal.  Data Reviewed CT scan from 2014 and contemporary CT scans  I have personally reviewed the patient's imaging, laboratory findings and medical records.    Assessment    There is a right upper lobe nodule that appears to my eyes to be unchanged since 2014. In addition the aorta measures 39 mm now versus 37 mm back in 2014.    Plan    I had a long discussion with her regarding the necessity for tobacco cessation.  We reviewed the results of the CT scans. I did recommend that she have a repeat CT scan in one year for follow-up of her aorta. We'll also ask our radiologist to review and compare this scan from 2014. If the lung nodule is present and unchanged over this timeframe we may consider it benign. She will come back to see [place] again in one year.      Nestor Lewandowsky, MD 04/25/2016, 11:54 AM

## 2016-04-25 NOTE — Patient Instructions (Signed)
We will have you follow-up in office in 1 year with a CT Scan of your Chest before to re-evaluate your aneursym.  We will ask the radiologist to take a second look at your lung nodule that you have now in comparison to your 2014 scan. I will let you know if we have made a decision to do anything differently than what we had talked about.

## 2016-04-25 NOTE — Telephone Encounter (Signed)
Patient seen in office today. After speaking with Dr. Genevive Bi, he would like for radiologist to look at Scans from 07/09/13 and 04/14/16 and compare pulmonary nodules to ensure that there is not much change between the 2.  Call made to [facility] at this time. She forwarded me to [place] at [facility]. I explained to her what we were needing. She will have radiologist review and then call me back with the read.

## 2016-04-27 NOTE — Telephone Encounter (Signed)
Radiologist read is now in chart. Will review with Dr. Genevive Bi.

## 2016-05-02 NOTE — Telephone Encounter (Signed)
Reviewed with Dr. Genevive Bi at this time. No further orders or follow-up needed for Lung Nodules. Will continue with 1 year follow-up CT of Aortic Aneurysm.

## 2016-05-04 ENCOUNTER — Telehealth: Payer: Self-pay | Admitting: Cardiothoracic Surgery

## 2016-05-04 NOTE — Telephone Encounter (Signed)
Returned phone call to patient at this time. No answer. Left voicemail requesting return call.

## 2016-05-04 NOTE — Telephone Encounter (Signed)
Patient would like to know if her results are back from her CT scan? Please call

## 2016-05-05 NOTE — Telephone Encounter (Signed)
Patient returned your phone call regarding CT results. Please call at your convenience. Thanks.

## 2016-05-05 NOTE — Telephone Encounter (Signed)
Returned phone call to patient. No answer. Left voicemail for return phone call. 

## 2016-05-22 ENCOUNTER — Other Ambulatory Visit (INDEPENDENT_AMBULATORY_CARE_PROVIDER_SITE_OTHER): Payer: Commercial Managed Care - HMO

## 2016-05-22 DIAGNOSIS — M1612 Unilateral primary osteoarthritis, left hip: Secondary | ICD-10-CM | POA: Diagnosis not present

## 2016-05-22 DIAGNOSIS — E785 Hyperlipidemia, unspecified: Secondary | ICD-10-CM | POA: Diagnosis not present

## 2016-05-22 DIAGNOSIS — M4806 Spinal stenosis, lumbar region: Secondary | ICD-10-CM | POA: Diagnosis not present

## 2016-05-22 DIAGNOSIS — M7062 Trochanteric bursitis, left hip: Secondary | ICD-10-CM | POA: Diagnosis not present

## 2016-05-22 DIAGNOSIS — M791 Myalgia: Secondary | ICD-10-CM | POA: Diagnosis not present

## 2016-05-22 LAB — COMPREHENSIVE METABOLIC PANEL
ALBUMIN: 3.9 g/dL (ref 3.5–5.2)
ALT: 11 U/L (ref 0–35)
AST: 19 U/L (ref 0–37)
Alkaline Phosphatase: 67 U/L (ref 39–117)
BUN: 16 mg/dL (ref 6–23)
CHLORIDE: 104 meq/L (ref 96–112)
CO2: 32 mEq/L (ref 19–32)
CREATININE: 0.84 mg/dL (ref 0.40–1.20)
Calcium: 9.4 mg/dL (ref 8.4–10.5)
GFR: 69.8 mL/min (ref >60.00)
GLUCOSE: 96 mg/dL (ref 70–99)
Potassium: 5.1 mEq/L (ref 3.5–5.1)
SODIUM: 142 meq/L (ref 135–145)
Total Bilirubin: 0.4 mg/dL (ref 0.2–1.2)
Total Protein: 6.4 g/dL (ref 6.0–8.3)

## 2016-05-22 LAB — LIPID PANEL
CHOL/HDL RATIO: 5
CHOLESTEROL: 216 mg/dL — AB (ref 0–200)
HDL: 44.3 mg/dL (ref >39.00)
NONHDL: 171.63
Triglycerides: 284 mg/dL — ABNORMAL HIGH (ref 0.0–149.0)
VLDL: 56.8 mg/dL — AB (ref 0.0–40.0)

## 2016-05-22 LAB — LDL CHOLESTEROL, DIRECT: LDL DIRECT: 126 mg/dL

## 2016-06-06 ENCOUNTER — Ambulatory Visit (INDEPENDENT_AMBULATORY_CARE_PROVIDER_SITE_OTHER): Payer: Commercial Managed Care - HMO | Admitting: Family Medicine

## 2016-06-06 ENCOUNTER — Encounter: Payer: Self-pay | Admitting: Family Medicine

## 2016-06-06 DIAGNOSIS — F418 Other specified anxiety disorders: Secondary | ICD-10-CM

## 2016-06-06 DIAGNOSIS — R911 Solitary pulmonary nodule: Secondary | ICD-10-CM | POA: Diagnosis not present

## 2016-06-06 DIAGNOSIS — F329 Major depressive disorder, single episode, unspecified: Secondary | ICD-10-CM

## 2016-06-06 DIAGNOSIS — F419 Anxiety disorder, unspecified: Principal | ICD-10-CM

## 2016-06-06 DIAGNOSIS — I712 Thoracic aortic aneurysm, without rupture, unspecified: Secondary | ICD-10-CM

## 2016-06-06 DIAGNOSIS — I719 Aortic aneurysm of unspecified site, without rupture: Secondary | ICD-10-CM

## 2016-06-06 DIAGNOSIS — E785 Hyperlipidemia, unspecified: Secondary | ICD-10-CM

## 2016-06-06 HISTORY — DX: Aortic aneurysm of unspecified site, without rupture: I71.9

## 2016-06-06 MED ORDER — RED YEAST RICE 600 MG PO CAPS: 1200.0000 mg | ORAL_CAPSULE | Freq: Every day | ORAL | Status: DC

## 2016-06-06 NOTE — Assessment & Plan Note (Signed)
Stable on Cymbalta. No SI. Discussed continuing to monitor. Did offer additional medication help or therapy given recent stressors though she declined.

## 2016-06-06 NOTE — Assessment & Plan Note (Signed)
Has seen surgery for this. They plan to recheck in one year. Advised that if she develops chest pain she should be evaluated.

## 2016-06-06 NOTE — Assessment & Plan Note (Signed)
Patient with elevated triglycerides recently. Doing a omega-3 and recently started red yeast rice. Working on diet. Intolerant to statins in the past. Continue red yeast rice and omega-3 and diet. Recheck triglycerides in 3-4 months.

## 2016-06-06 NOTE — Assessment & Plan Note (Signed)
Saw surgeon and they stated repeat CT scan in one year. I advised her to quit smoking though she declined.

## 2016-06-06 NOTE — Progress Notes (Signed)
Pre visit review using our clinic review tool, if applicable. No additional management support is needed unless otherwise documented below in the visit note. 

## 2016-06-06 NOTE — Progress Notes (Signed)
  [name], MD Phone: [phone]  Brittany Mays is a 75 y.o. female who presents today for follow-up.  Anxiety/depression: Patient notes this is stable. Has had a show down with her granddaughter and subsequently sent her granddaughter back to [place] to live with her mother. This has been a source of stress and stress relief. Cymbalta helps take the edge off. No SI.  Hypertriglyceridemia: Triglycerides elevated on recent check. Taking Fish oil. Started taking red yeast rice. Has worked on her diet by decreasing sweet intake. Not exercising much.  Lung nodule: Saw surgery. They stated follow-up in one year. She has no interest in quitting smoking at this time. No shortness of breath.  Aortic aneurysm: Also saw surgery for this. Had grown minimally since last scan. Recommended follow-up in one year. No chest pain.  PMH: Smoker   ROS see history of present illness  Objective  Physical Exam Vitals:   06/06/16 1001  BP: 114/68  Pulse: 90  Temp: 97.7 F (36.5 C)    BP Readings from Last 3 Encounters:  06/06/16 114/68  04/25/16 136/79  04/20/16 118/70   Wt Readings from Last 3 Encounters:  06/06/16 128 lb 3.2 oz (58.2 kg)  04/25/16 129 lb 6.4 oz (58.7 kg)  04/20/16 128 lb (58.1 kg)    Physical Exam  Constitutional: No distress.  HENT:  Head: Normocephalic and atraumatic.  Cardiovascular: Normal rate, regular rhythm and normal heart sounds.   Pulmonary/Chest: Effort normal and breath sounds normal.  Neurological: She is alert. Gait normal.  Skin: Skin is warm and dry. She is not diaphoretic.  Psychiatric:  Mood anxious, affect anxious     Assessment/Plan: Please see individual problem list.  Anxiety and depression Stable on Cymbalta. No SI. Discussed continuing to monitor. Did offer additional medication help or therapy given recent stressors though she declined.  Hyperlipidemia Patient with elevated triglycerides recently. Doing a omega-3 and  recently started red yeast rice. Working on diet. Intolerant to statins in the past. Continue red yeast rice and omega-3 and diet. Recheck triglycerides in 3-4 months.  Solitary pulmonary nodule Saw surgeon and they stated repeat CT scan in one year. I advised her to quit smoking though she declined.  Aortic aneurysm ([place]) Has seen surgery for this. They plan to recheck in one year. Advised that if she develops chest pain she should be evaluated.   No orders of the defined types were placed in this encounter.   Meds ordered this encounter  Medications  . gabapentin (NEURONTIN) 600 MG tablet  . Red Yeast Rice 600 MG CAPS    Sig: Take 2 capsules (1,200 mg total) by mouth daily.    [name], MD [facility]

## 2016-06-06 NOTE — Patient Instructions (Signed)
Nice to see you. Please continue monitoring your anxiety and if this worsens please let us know. Please continue the omega-3's and red yeast rice for your cholesterol. If you develop chest pain or worsening anxiety or thoughts of harming your self please seek medical attention.

## 2016-06-20 ENCOUNTER — Telehealth: Payer: Self-pay | Admitting: *Deleted

## 2016-06-20 NOTE — Telephone Encounter (Signed)
lvm

## 2016-06-20 NOTE — Telephone Encounter (Signed)
Last labs were in 05/22/2016, please advise and order as needed. thanks

## 2016-06-20 NOTE — Telephone Encounter (Signed)
No labs needed  thanks

## 2016-06-20 NOTE — Telephone Encounter (Signed)
No need for labs prior to her visit. We'll determine at her visit if she needs any lab work.

## 2016-06-20 NOTE — Telephone Encounter (Signed)
Patient questioned if she will need labs prior to follow up visit  Pt contact [phone]

## 2016-06-21 NOTE — Telephone Encounter (Signed)
Pt called back. I gave pt the information regarding no labs needed per Nurse. Pt understood.

## 2016-06-21 NOTE — Telephone Encounter (Signed)
Noted thanks °

## 2016-07-11 ENCOUNTER — Encounter: Payer: Self-pay | Admitting: Pain Medicine

## 2016-07-11 DIAGNOSIS — Z0189 Encounter for other specified special examinations: Secondary | ICD-10-CM | POA: Insufficient documentation

## 2016-07-11 DIAGNOSIS — M961 Postlaminectomy syndrome, not elsewhere classified: Secondary | ICD-10-CM | POA: Insufficient documentation

## 2016-07-11 DIAGNOSIS — G8929 Other chronic pain: Secondary | ICD-10-CM | POA: Insufficient documentation

## 2016-07-11 DIAGNOSIS — F1190 Opioid use, unspecified, uncomplicated: Secondary | ICD-10-CM | POA: Insufficient documentation

## 2016-07-11 DIAGNOSIS — M4316 Spondylolisthesis, lumbar region: Secondary | ICD-10-CM | POA: Insufficient documentation

## 2016-07-11 DIAGNOSIS — M47816 Spondylosis without myelopathy or radiculopathy, lumbar region: Secondary | ICD-10-CM | POA: Insufficient documentation

## 2016-07-11 DIAGNOSIS — M461 Sacroiliitis, not elsewhere classified: Secondary | ICD-10-CM | POA: Insufficient documentation

## 2016-07-11 DIAGNOSIS — R937 Abnormal findings on diagnostic imaging of other parts of musculoskeletal system: Secondary | ICD-10-CM | POA: Insufficient documentation

## 2016-07-11 DIAGNOSIS — Z96653 Presence of artificial knee joint, bilateral: Secondary | ICD-10-CM | POA: Insufficient documentation

## 2016-07-11 DIAGNOSIS — M48061 Spinal stenosis, lumbar region without neurogenic claudication: Secondary | ICD-10-CM | POA: Insufficient documentation

## 2016-07-11 DIAGNOSIS — Z5181 Encounter for therapeutic drug level monitoring: Secondary | ICD-10-CM | POA: Insufficient documentation

## 2016-07-11 DIAGNOSIS — Z79891 Long term (current) use of opiate analgesic: Secondary | ICD-10-CM | POA: Insufficient documentation

## 2016-07-11 DIAGNOSIS — M159 Polyosteoarthritis, unspecified: Secondary | ICD-10-CM | POA: Insufficient documentation

## 2016-07-11 DIAGNOSIS — M47818 Spondylosis without myelopathy or radiculopathy, sacral and sacrococcygeal region: Secondary | ICD-10-CM

## 2016-07-11 DIAGNOSIS — M25561 Pain in right knee: Secondary | ICD-10-CM

## 2016-07-11 DIAGNOSIS — M25562 Pain in left knee: Secondary | ICD-10-CM

## 2016-07-11 DIAGNOSIS — M545 Low back pain, unspecified: Secondary | ICD-10-CM | POA: Insufficient documentation

## 2016-07-12 ENCOUNTER — Ambulatory Visit: Payer: Commercial Managed Care - HMO | Attending: Pain Medicine | Admitting: Pain Medicine

## 2016-07-12 ENCOUNTER — Encounter: Payer: Self-pay | Admitting: Pain Medicine

## 2016-07-12 ENCOUNTER — Other Ambulatory Visit
Admission: RE | Admit: 2016-07-12 | Discharge: 2016-07-12 | Disposition: A | Discharge status: Home / Self Care | Payer: Commercial Managed Care - HMO | Source: Ambulatory Visit | Attending: Pain Medicine | Admitting: Pain Medicine

## 2016-07-12 VITALS — BP 152/75 | HR 88 | Temp 98.7°F | Resp 18 | Ht 60.0 in | Wt 126.0 lb

## 2016-07-12 DIAGNOSIS — R531 Weakness: Secondary | ICD-10-CM | POA: Insufficient documentation

## 2016-07-12 DIAGNOSIS — M25562 Pain in left knee: Secondary | ICD-10-CM | POA: Diagnosis not present

## 2016-07-12 DIAGNOSIS — M25561 Pain in right knee: Secondary | ICD-10-CM | POA: Diagnosis not present

## 2016-07-12 DIAGNOSIS — K623 Rectal prolapse: Secondary | ICD-10-CM | POA: Insufficient documentation

## 2016-07-12 DIAGNOSIS — M25551 Pain in right hip: Secondary | ICD-10-CM | POA: Insufficient documentation

## 2016-07-12 DIAGNOSIS — R911 Solitary pulmonary nodule: Secondary | ICD-10-CM | POA: Diagnosis not present

## 2016-07-12 DIAGNOSIS — M960 Pseudarthrosis after fusion or arthrodesis: Secondary | ICD-10-CM

## 2016-07-12 DIAGNOSIS — M4316 Spondylolisthesis, lumbar region: Secondary | ICD-10-CM | POA: Diagnosis not present

## 2016-07-12 DIAGNOSIS — K5903 Drug induced constipation: Secondary | ICD-10-CM | POA: Diagnosis not present

## 2016-07-12 DIAGNOSIS — Y792 Prosthetic and other implants, materials and accessory orthopedic devices associated with adverse incidents: Secondary | ICD-10-CM | POA: Diagnosis not present

## 2016-07-12 DIAGNOSIS — M1991 Primary osteoarthritis, unspecified site: Secondary | ICD-10-CM | POA: Diagnosis not present

## 2016-07-12 DIAGNOSIS — F17210 Nicotine dependence, cigarettes, uncomplicated: Secondary | ICD-10-CM | POA: Insufficient documentation

## 2016-07-12 DIAGNOSIS — M539 Dorsopathy, unspecified: Secondary | ICD-10-CM

## 2016-07-12 DIAGNOSIS — Z981 Arthrodesis status: Secondary | ICD-10-CM | POA: Insufficient documentation

## 2016-07-12 DIAGNOSIS — I719 Aortic aneurysm of unspecified site, without rupture: Secondary | ICD-10-CM | POA: Insufficient documentation

## 2016-07-12 DIAGNOSIS — M47818 Spondylosis without myelopathy or radiculopathy, sacral and sacrococcygeal region: Secondary | ICD-10-CM

## 2016-07-12 DIAGNOSIS — G4700 Insomnia, unspecified: Secondary | ICD-10-CM | POA: Insufficient documentation

## 2016-07-12 DIAGNOSIS — Z6839 Body mass index (BMI) 39.0-39.9, adult: Secondary | ICD-10-CM | POA: Insufficient documentation

## 2016-07-12 DIAGNOSIS — E669 Obesity, unspecified: Secondary | ICD-10-CM | POA: Diagnosis not present

## 2016-07-12 DIAGNOSIS — R937 Abnormal findings on diagnostic imaging of other parts of musculoskeletal system: Secondary | ICD-10-CM

## 2016-07-12 DIAGNOSIS — M461 Sacroiliitis, not elsewhere classified: Secondary | ICD-10-CM

## 2016-07-12 DIAGNOSIS — M47816 Spondylosis without myelopathy or radiculopathy, lumbar region: Secondary | ICD-10-CM

## 2016-07-12 DIAGNOSIS — Z87440 Personal history of urinary (tract) infections: Secondary | ICD-10-CM | POA: Insufficient documentation

## 2016-07-12 DIAGNOSIS — M533 Sacrococcygeal disorders, not elsewhere classified: Secondary | ICD-10-CM | POA: Insufficient documentation

## 2016-07-12 DIAGNOSIS — Z5181 Encounter for therapeutic drug level monitoring: Secondary | ICD-10-CM

## 2016-07-12 DIAGNOSIS — M961 Postlaminectomy syndrome, not elsewhere classified: Secondary | ICD-10-CM | POA: Diagnosis not present

## 2016-07-12 DIAGNOSIS — G8929 Other chronic pain: Secondary | ICD-10-CM | POA: Diagnosis not present

## 2016-07-12 DIAGNOSIS — M160 Bilateral primary osteoarthritis of hip: Secondary | ICD-10-CM

## 2016-07-12 DIAGNOSIS — R1032 Left lower quadrant pain: Secondary | ICD-10-CM | POA: Diagnosis not present

## 2016-07-12 DIAGNOSIS — M159 Polyosteoarthritis, unspecified: Secondary | ICD-10-CM

## 2016-07-12 DIAGNOSIS — M545 Low back pain: Secondary | ICD-10-CM | POA: Diagnosis not present

## 2016-07-12 DIAGNOSIS — H269 Unspecified cataract: Secondary | ICD-10-CM | POA: Insufficient documentation

## 2016-07-12 DIAGNOSIS — Z7901 Long term (current) use of anticoagulants: Secondary | ICD-10-CM | POA: Insufficient documentation

## 2016-07-12 DIAGNOSIS — R1030 Lower abdominal pain, unspecified: Secondary | ICD-10-CM

## 2016-07-12 DIAGNOSIS — M150 Primary generalized (osteo)arthritis: Secondary | ICD-10-CM

## 2016-07-12 DIAGNOSIS — Z90710 Acquired absence of both cervix and uterus: Secondary | ICD-10-CM | POA: Insufficient documentation

## 2016-07-12 DIAGNOSIS — Z9889 Other specified postprocedural states: Secondary | ICD-10-CM

## 2016-07-12 DIAGNOSIS — M47896 Other spondylosis, lumbar region: Secondary | ICD-10-CM | POA: Insufficient documentation

## 2016-07-12 DIAGNOSIS — M25552 Pain in left hip: Secondary | ICD-10-CM

## 2016-07-12 DIAGNOSIS — M1288 Other specific arthropathies, not elsewhere classified, other specified site: Secondary | ICD-10-CM | POA: Diagnosis not present

## 2016-07-12 DIAGNOSIS — M4726 Other spondylosis with radiculopathy, lumbar region: Secondary | ICD-10-CM | POA: Insufficient documentation

## 2016-07-12 DIAGNOSIS — M4806 Spinal stenosis, lumbar region: Secondary | ICD-10-CM | POA: Insufficient documentation

## 2016-07-12 DIAGNOSIS — F418 Other specified anxiety disorders: Secondary | ICD-10-CM | POA: Diagnosis not present

## 2016-07-12 DIAGNOSIS — Z0189 Encounter for other specified special examinations: Secondary | ICD-10-CM

## 2016-07-12 DIAGNOSIS — Z79891 Long term (current) use of opiate analgesic: Secondary | ICD-10-CM | POA: Diagnosis not present

## 2016-07-12 DIAGNOSIS — Z96653 Presence of artificial knee joint, bilateral: Secondary | ICD-10-CM | POA: Diagnosis not present

## 2016-07-12 DIAGNOSIS — M5116 Intervertebral disc disorders with radiculopathy, lumbar region: Secondary | ICD-10-CM | POA: Insufficient documentation

## 2016-07-12 DIAGNOSIS — Z79899 Other long term (current) drug therapy: Secondary | ICD-10-CM | POA: Diagnosis not present

## 2016-07-12 DIAGNOSIS — T84498S Other mechanical complication of other internal orthopedic devices, implants and grafts, sequela: Secondary | ICD-10-CM

## 2016-07-12 DIAGNOSIS — M48061 Spinal stenosis, lumbar region without neurogenic claudication: Secondary | ICD-10-CM

## 2016-07-12 DIAGNOSIS — T402X5A Adverse effect of other opioids, initial encounter: Secondary | ICD-10-CM

## 2016-07-12 DIAGNOSIS — E785 Hyperlipidemia, unspecified: Secondary | ICD-10-CM | POA: Insufficient documentation

## 2016-07-12 DIAGNOSIS — M47817 Spondylosis without myelopathy or radiculopathy, lumbosacral region: Secondary | ICD-10-CM

## 2016-07-12 DIAGNOSIS — Z87442 Personal history of urinary calculi: Secondary | ICD-10-CM | POA: Insufficient documentation

## 2016-07-12 DIAGNOSIS — F1190 Opioid use, unspecified, uncomplicated: Secondary | ICD-10-CM

## 2016-07-12 LAB — COMPREHENSIVE METABOLIC PANEL
ALK PHOS: 69 U/L (ref 38–126)
ALT: 18 U/L (ref 14–54)
ANION GAP: 7 (ref 5–15)
AST: 25 U/L (ref 15–41)
Albumin: 4.3 g/dL (ref 3.5–5.0)
BILIRUBIN TOTAL: 0.4 mg/dL (ref 0.3–1.2)
BUN: 14 mg/dL (ref 6–20)
CALCIUM: 9.1 mg/dL (ref 8.9–10.3)
CO2: 31 mmol/L (ref 22–32)
Chloride: 102 mmol/L (ref 101–111)
Creatinine, Ser: 0.7 mg/dL (ref 0.44–1.00)
Glucose, Bld: 85 mg/dL (ref 65–99)
POTASSIUM: 4.1 mmol/L (ref 3.5–5.1)
Sodium: 140 mmol/L (ref 135–145)
TOTAL PROTEIN: 7.4 g/dL (ref 6.5–8.1)

## 2016-07-12 LAB — VITAMIN B12: Vitamin B-12: 1782 pg/mL — ABNORMAL HIGH (ref 180–914)

## 2016-07-12 LAB — MAGNESIUM: Magnesium: 2 mg/dL (ref 1.7–2.4)

## 2016-07-12 LAB — SEDIMENTATION RATE: SED RATE: 6 mm/h (ref 0–30)

## 2016-07-12 LAB — C-REACTIVE PROTEIN: CRP: 0.6 mg/dL (ref <1.0)

## 2016-07-12 NOTE — Progress Notes (Signed)
Safety precautions to be maintained throughout the outpatient stay will include: orient to surroundings, keep bed in low position, maintain call bell within reach at all times, provide assistance with transfer out of bed and ambulation.  

## 2016-07-12 NOTE — Progress Notes (Signed)
Patient's Name: Brittany Mays  MRN: [id number]  Referring Provider: Starling Manns, MD  DOB: Feb 12, 1941  PCP: [name], MD  DOS: 07/12/2016  Note by: Kathlen Brunswick. Dossie Arbour, MD  Service setting: Ambulatory outpatient  Specialty: Interventional Pain Management  Location: [facility] (AMB) Pain Management Facility    Patient type: New patient   Primary Reason(s) for Visit: Initial Patient Evaluation CC: Back Pain (lower) and Hip Pain (left)  HPI  Ms. Othman is a 75 y.o. year old, female patient, who comes today for an initial evaluation. She has Anxiety and depression; Obesity (BMI 30-39.9); Lumbar spinal stenosis; History of lumbar fusion (T10 through L3 fusion); Generalized weakness; Mechanical complication of internal fixation device such as nail, plate or rod ([place]); Pseudarthrosis after fusion or arthrodesis; Solitary pulmonary nodule; Chronic hip pain (Location of Secondary source of pain) (Left); Hyperlipidemia; Aortic aneurysm ([place]); Osteoarthritis, multiple sites; Chronic pain; Chronic low back pain (Location of Primary Source of Pain) (Bilateral) (R>L); Failed back surgical syndrome (x4); Chronic knee pain (Bilateral) (L>R); Long term current use of opiate analgesic; Long term prescription opiate use; Opiate use (60 MME/Day); Encounter for pain management planning; Encounter for therapeutic drug level monitoring; Abnormal CT scan, lumbar spine (05/07/2015); 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 Her primarily concern today is the Back Pain (lower) and Hip Pain (left)  Pain Assessment: Self-Reported Pain Score: 5  Clinically the  patient looks like a 3/10 Reported level is inconsistent with clinical obrservations Information on the proper use of the pain score provided to the patient today. Pain Type: Chronic pain Pain Location: Back Pain Orientation: Lower Pain Descriptors / Indicators: Sharp Pain Frequency: Constant  Onset and Duration: Sudden, Date of onset: Before 1982 and Present longer than 3 months Cause of pain: Surgery Severity: Getting worse, NAS-11 at its worse: 9/10, NAS-11 at its best: 6/10, NAS-11 now: 7/10 and NAS-11 on the average: 7/10 Timing: Not influenced by the time of the day, During activity or exercise, After activity or exercise and After a period of immobility Aggravating Factors: Bending, Climbing, Kneeling, Lifiting, Prolonged standing, Squatting, Stooping , Surgery made it worse, Twisting, Walking uphill and Walking downhill Alleviating Factors: Lying down, Medications, Resting and Warm showers or baths Associated Problems: Constipation, Depression, Fatigue, Sadness and Weakness Quality of Pain: Aching, Agonizing, Constant, Deep, Distressing, Exhausting, Getting longer, Horrible, Sharp, Sickening, Stabbing, Superficial and Tiring Previous Examinations or Tests: CT scan, MRI scan, X-rays and Orthoperdic evaluation Previous Treatments: Epidural steroid injections, Narcotic medications and Trigger point injections  The patient comes into the clinics today for the first time for a chronic pain management evaluation. The patient's primary pain is that of the mid and lower back where the pain is bilateral but worse on the left side. The patient indicates having had 4 back surgeries starting in 1982 with the last one having been in 2016. According to the patient her first surgery was done in [place] and the last 3 have been done by Dr. Starling Manns (Orthopedic Spine Specialist) (Address: [facility], [address], [place], [place] [address] / Phone: [phone]). According  to medical records Dr. Patrice Paradise did a series of 3 lumbar epidural steroid injections around 2009 with 2 of them being  at the L5-S1 level and one at the L2-3 level. According to the patient her first back surgery was done due to low back pain. She denies having had any lower extremity pain at the time. She indicates that this surgery about her with 80% relief of her low back pain. Apparently her pain started coming back and this is when she had her surgeries done by Dr. Patrice Paradise. According to the patient her second surgery she had at due to low back pain and again she denies having had any lower extremity pain at this time. She indicates that this surgery lowered her pain by only 50%. Following this she had her third back surgery, again for her back pain. This time the surgery did not help her pain at all. After this third surgery, she then had her fourth and last surgery which was done around 2016. The patient indicates that not only this surgery did not help the pain but it actually created more. This surgery was complicated with a UTI that landed her in the ICU. She denies having had any osteomyelitis, discitis, or meningitis.  The patient's second worst area pain is that of her left hip. She denies any surgery in that area but she does admit having had an injection which apparently was done by her orthopedic surgeon as well. The third area of pain is that of the left groin which she believes is secondary to the hip problems. Next is her bilateral knee pain with the left being worst on the right.  Today I took the time to provide the patient with information regarding my pain practice. The patient was informed that my practice is divided into two sections: an interventional pain management section, as well as a completely separate and distinct medication management section. The interventional portion of my practice takes place on Tuesdays and Thursdays, while the medication management is conducted on Mondays and  Wednesdays. Because of the amount of documentation required on both them, they are kept separated. This means that there is the possibility that the patient may be scheduled for a procedure on Tuesday, while also having a medication management appointment on Wednesday. I have also informed the patient that because of current staffing and facility limitations, I no longer take patients for medication management only. To illustrate the reasons for this, I gave the patient the example of a surgeon and how inappropriate it would be to refer a patient to his/her practice so that they write for the post-procedure antibiotics on a surgery done by someone else.   The patient was informed that joining my practice means that they are open to any and all interventional therapies. I clarified for the patient that this does not mean that they will be forced to have any procedures done. What it means is that patients looking for a practitioner to simply write for their pain medications and not take advantage of other interventional techniques will be better served by a different practitioner, other than myself. I made it clear that I prefer to spend my time providing those services that I specialize in.  The patient was also made aware of my Comprehensive Pain Management Safety Guidelines where by joining my practice, they limit all of their nerve blocks and joint injections to those done by our practice, for as long as we are retained to manage their controlled substances.   Historic Controlled Substance Pharmacotherapy Review  Previously Prescribed Opioids: Hydrocodone/APAP 7.5/325 6 tab(s)/day (45 mg/day of hydrocodone) (2011); oxycodone IR 15  mg 6 tab(s)/day (90 mg/day of oxycodone); oxycodone IR 20 mg 6 tab(s)/day (120 mg/day of oxycodone) (08/10/2015). EMBEDA ER 20-0.8 MG CAPSULE 2 tab(s)/day; oxycodone/APAP 10/325 5 tab(s)/day; tramadol 50 mg; alprazolam 0.5 mg; lorazepam 1 mg; Ambien 10 mg. Currently Prescribed  Analgesic: Oxycodone IR 10 mg every 6 hours (40 mg/day of oxycodone) Medications: The patient did not bring the medication(s) to the appointment, as requested in our "New Patient Package"  Historical MME/day: 180 mg/day MME/day: 60 mg/day Pharmacodynamics: Analgesic Effect: More than 50% Activity Facilitation: Medication(s) allow patient to sit, stand, walk, and do the basic ADLs Perceived Effectiveness: Described as relatively effective, allowing for increase in activities of daily living (ADL) Side-effects or Adverse reactions: None reported Historical Background Evaluation: [place] PDMP: Five (5) year initial data search conducted. Extensive, regular use of opioid analgesics since 10/23/2011. [place] [employer] Public [employer] Offender Public Information: Non-contributory UDS Results: No UDS results available at this time UDS Interpretation: N/A Medication Assessment Form: Not applicable. Initial evaluation. The patient has not received any medications from our practice Treatment compliance: Not applicable. Initial evaluation Risk Assessment: Aberrant Behavior: None observed or detected today Opioid Fatal Overdose Risk Factors: None identified today Non-fatal overdose hazard ratio (HR): 3.73 for 50-99 MME/day Fatal overdose hazard ratio (HR): 1.92 for 50-99 MME/day Substance Use Disorder (SUD) Risk Level: Pending results of Medical Psychology Evaluation for SUD Opioid Risk Tool (ORT) Score: Total Score: 1 Low Risk for SUD (Score <3) Depression Scale Score: PHQ-2: PHQ-2 Total Score: 0 No depression (0) PHQ-9: PHQ-9 Total Score: 0 No depression (0-4)  Pharmacologic Plan: Pending ordered tests and/or consults  Historical Illicit Drug Screen Labs(s): Lab Results  Component Value Date   COCAINSCRNUR NONE DETECTED 04/29/2014   THCU NONE DETECTED 04/29/2014    Meds  The patient has a current medication list which includes the following prescription(s): vitamin c,  diphenhydramine-acetaminophen, duloxetine, fish oil-omega-3 fatty acids, gabapentin, glucosamine-chondroitin, melatonin, womens multi, oxycodone hcl, oxycodone hcl, red yeast rice, senna laxative, and vitamin b-12.  Current Outpatient Prescriptions on File Prior to Visit  Medication Sig  . DULoxetine (CYMBALTA) 60 MG capsule Take 1 capsule (60 mg total) by mouth daily.  . fish oil-omega-3 fatty acids 1000 MG capsule Take 1 g by mouth daily.   Marland Kitchen gabapentin (NEURONTIN) 600 MG tablet 2 (two) times daily. 300 mg in a.m., 600 mg at night  . Melatonin 3 MG CAPS Take 1 capsule (3 mg total) by mouth at bedtime as needed (insomnia). (Patient taking differently: Take 5 mg by mouth at bedtime as needed (insomnia). )  . Multiple Vitamins-Minerals (WOMENS MULTI) CAPS Take one tablet once daily  . Red Yeast Rice 600 MG CAPS Take 2 capsules (1,200 mg total) by mouth daily.  . vitamin B-12 (CYANOCOBALAMIN) 100 MCG tablet Take 1,000 mcg by mouth daily.    No current facility-administered medications on file prior to visit.     Imaging Review  Thoracic Imaging: Thoracic MR w/wo contrast:  Results for orders placed during the hospital encounter of 04/28/14  MR Thoracic Spine W Wo Contrast   Narrative CLINICAL DATA:  Thoracic and lumbar spine surgery on 06/15. Worsening pain. Altered mental status.  EXAM: MRI THORACIC AND LUMBAR SPINE WITHOUT AND WITH CONTRAST  TECHNIQUE: Multiplanar and multiecho pulse sequences of the thoracic and lumbar spine were obtained without and with intravenous contrast.  CONTRAST:  40mL MULTIHANCE GADOBENATE DIMEGLUMINE 529 MG/ML IV SOLN  COMPARISON:  Lumbar spine MRI 02/27/2014  FINDINGS: Images are mildly two moderately  degraded by motion artifact.  Thoracic vertebral alignment is normal. Thoracic vertebral body heights are preserved. There is mild to moderate disc space narrowing with associated degenerative marrow changes from T4-T9. The thoracic spinal cord is  normal in caliber and signal although is partially obscured by susceptibility artifact in the lower thoracic spine. A small amount of patchy signal abnormality is noted in the dependent right lung, incompletely evaluated but may reflect subsegmental atelectasis.  Patient's prior posterior lumbar fusion has been extended proximally since the prior MRI, with bilateral pedicle screws now present from T10-L3. Superficial gas and fluid collection along the surgical incision measures approximately 12 cm in craniocaudal length and has greatest axial measurements of 2.4 x 2.6 cm at the T11 level. There is mild surrounding enhancement. Fluid collection at the L1-2 laminectomy site measures 6.7 x 2.4 cm (transverse by AP). Small dorsal epidural fluid collection/hematoma at the L1-2 level measures 15 x 6 mm (series 11, image 26). This fluid collection and a circumferential L1-2 disc bulge result in moderate spinal stenosis. A small amount of dorsal epidural fluid/hematoma at L2-3 measures 10 x 5 mm without stenosis.  Evaluation of the spinal canal in the lower thoracic spine is limited by susceptibility artifact from fixation hardware. The right T12 pedicle screw appears to traverse the right lateral recess. The conus medullaris terminates at L1.  Grade 1 anterolisthesis of L3 on L4 and L4 on L5 is unchanged. Sequelae of prior laminectomies are again identified from L2-3 to L4-5. Mild lateral recess narrowing at L4-5 is unchanged. L5-S1 interbody fusion is noted. There is mild bilateral neural foraminal narrowing at L5-S1, right greater than left.  IMPRESSION: 1. Interval thoracolumbar posterior fusion. 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 2. Fluid collections at the L1-2 laminectomy site and superficially along the surgical incision, which most likely represent postoperative hematoma/seromas, although  superimposed infection cannot be excluded. 3. The right T12 pedicle screw appears to traverse the right lateral recess.   Electronically Signed   By: Logan Bores   On: 04/28/2014 20:19    Thoracic CT wo contrast:  Results for orders placed during the hospital encounter of 05/07/15  CT Thoracic Spine Wo Contrast   Narrative CLINICAL DATA:  Intervertebral disc disorder with radiculopathy. Prior thoracolumbar fusion. LEFT leg pain.  EXAM: CT THORACIC AND LUMBAR SPINE WITHOUT CONTRAST  TECHNIQUE: Multidetector CT imaging of the thoracic and lumbar spine was performed without contrast. Multiplanar CT image reconstructions were also generated.  COMPARISON:  MRI of the thoracic and lumbar spine 04/28/2014.  FINDINGS: CT THORACIC SPINE FINDINGS  T10 through L3 fusion. Loosening of the BILATERAL T10 screws. Slight medial placement of the RIGHT T11 and T12 screws. Solid posterior fusion at T12-L1, but no evidence of T10-11 or T11-12 mature arthrodesis. Multilevel advanced disc space narrowing throughout the thoracic spine without worrisome osseous lesion or compression fracture. This appearance is similar to prior thoracic MR from 2015. Moderate C6-7 spondylosis.  CT LUMBAR SPINE FINDINGS  T10 through L3 fusion. Inferior aspect of the fusion construct is intact. The L1, L2, and L3 screws are unremarkable. Adequate posterior decompression at L1-2 and L2-3. Slight vacuum phenomenon in the L1-2 interspace non worrisome. No visible impingement at these levels.  At L3-4 there is 6 mm anterolisthesis. There is moderate disc space narrowing with calcification of the annulus posteriorly but no significant protrusion. Moderate posterior element hypertrophy. Solid appearing posterior fusion at this level.  No impingement.  At L4-5 there is a central protrusion. Vacuum phenomenon is seen within the interspace. 3 mm anterolisthesis. Advanced facet arthropathy. Previous laminectomy with  partial regrowth of posterior elements. LEFT greater than RIGHT subarticular zone and foraminal narrowing could affect the nerve roots at this level.  At L5-S1 there is 2 mm of facet mediated slip. Severe disc space narrowing. Functional fusion posteriorly and across the interspace. No spinal stenosis or subarticular zone narrowing. BILATERAL foraminal narrowing relates to disc osteophyte complex and posterior element hypertrophy, affecting the LEFT and RIGHT L5 nerve roots.  Moderate atheromatous change of the aorta without aneurysmal dilatation. No paravertebral or renal mass.  Compared with MR from 04/28/2014, the appearance at L3-4 and L5-S1 is similar. Central protrusion at L4-5 May be increased. Previously documented fluid collection is improved/resolved.  IMPRESSION: CT THORACIC SPINE IMPRESSION  No solid T10-11 or T11-12 arthrodesis. Slight medial placement of the RIGHT T11 and RIGHT T12 screws entering the lateral spinal canal.  BILATERAL T10 screw loosening.  CT LUMBAR SPINE IMPRESSION  Solid T12 through L3 fusion.  L3-4 fusion in a position of 6 mm anterolisthesis.  No impingement.  Central protrusion at L4-5 with 3 mm anterolisthesis. Partial regrowth of posterior elements. LEFT greater than RIGHT neural impingement is likely.  Facet mediated slip at L5-S1.  BILATERAL foraminal narrowing.   Electronically Signed   By: Staci Righter M.D.   On: 05/07/2015 13:05    Lumbosacral Imaging: Lumbar MR wo contrast:  Results for orders placed during the hospital encounter of 02/27/14  MR Lumbar Spine Wo Contrast   Narrative CLINICAL DATA:  LBP Severe low back pain radiating into the right hip and gluteal region for 3 weeks. History of prior spinal fusion in 2010.  EXAM: MRI LUMBAR SPINE WITHOUT CONTRAST  TECHNIQUE: Multiplanar, multisequence MR imaging was performed. No intravenous contrast was administered.  COMPARISON:  MR L SPINE W/O dated  03/16/2008  FINDINGS: Numbering used on prior exam is preserved. Vertebral body height is within normal limits. L5-S1 solid fusion. Spinal cord terminates posterior to the L1 vertebra. grade I anterolisthesis of L3 on L4 measures 6 mm. 3 mm of anterolisthesis of L4 on L5. Progressive degenerative disease is present. L2-L3 rod and screw fixation. Paraspinal soft tissues appear within normal limits. Respiratory motion artifact is present on the axial images. There is some clumping of nerve roots at the L3-L4 level compatible with focal arachnoiditis. Lower thoracic levels show disc degeneration but no stenosis.  L1-L2: Severe central stenosis. This is compatible with adjacent segment disease. The disc is severely degenerated and there is a broad-based posterior protrusion. Additionally, posterior ligamentum flavum redundancy is present. Narrowing of both lateral recesses. Mild bilateral foraminal stenosis.  L2-L3: Laminectomy.  Central canal and foramina appear decompressed.  L3-L4: Laminectomy. Central canal decompressed. Foramina patent. There appears to be solid posterior lateral fusion.  L4-L5: Laminectomy. Mild residual/ recurrent central stenosis. Left lateral recess encroachment potentially affecting the descending left L5 nerve. Facet joints appear to remain open. Mild uncoverage of the disc contributes to the central stenosis. Mild left foraminal stenosis secondary to bulging disc and anterolisthesis.  L5-S1: Solid fusion.  No stenosis.  IMPRESSION: 1. Interval L2-L3 partial discectomy and rod and pedicle screw fixation. No recurrent stenosis. 2. 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 3. L3-L4 laminectomy without recurrent stenosis. Probable solid posterior lateral fusion. L5-S1 solid fusion. 4. L4-L5 unchanged degenerative disease with  mild left lateral recess and  left foraminal stenosis.   Electronically Signed   By: Dereck Ligas M.D.   On: 02/27/2014 18:33    Lumbar MR w/wo contrast:  Results for orders placed during the hospital encounter of 04/28/14  MR Lumbar Spine W Wo Contrast   Narrative CLINICAL DATA:  Thoracic and lumbar spine surgery on 06/15. Worsening pain. Altered mental status.  EXAM: MRI THORACIC AND LUMBAR SPINE WITHOUT AND WITH CONTRAST  TECHNIQUE: Multiplanar and multiecho pulse sequences of the thoracic and lumbar spine were obtained without and with intravenous contrast.  CONTRAST:  17mL MULTIHANCE GADOBENATE DIMEGLUMINE 529 MG/ML IV SOLN  COMPARISON:  Lumbar spine MRI 02/27/2014  FINDINGS: Images are mildly two moderately degraded by motion artifact.  Thoracic vertebral alignment is normal. Thoracic vertebral body heights are preserved. There is mild to moderate disc space narrowing with associated degenerative marrow changes from T4-T9. The thoracic spinal cord is normal in caliber and signal although is partially obscured by susceptibility artifact in the lower thoracic spine. A small amount of patchy signal abnormality is noted in the dependent right lung, incompletely evaluated but may reflect subsegmental atelectasis.  Patient's prior posterior lumbar fusion has been extended proximally since the prior MRI, with bilateral pedicle screws now present from T10-L3. Superficial gas and fluid collection along the surgical incision measures approximately 12 cm in craniocaudal length and has greatest axial measurements of 2.4 x 2.6 cm at the T11 level. There is mild surrounding enhancement. Fluid collection at the L1-2 laminectomy site measures 6.7 x 2.4 cm (transverse by AP). Small dorsal epidural fluid collection/hematoma at the L1-2 level measures 15 x 6 mm (series 11, image 26). This fluid collection and a circumferential L1-2 disc bulge result in moderate spinal stenosis. A small amount of dorsal  epidural fluid/hematoma at L2-3 measures 10 x 5 mm without stenosis.  Evaluation of the spinal canal in the lower thoracic spine is limited by susceptibility artifact from fixation hardware. The right T12 pedicle screw appears to traverse the right lateral recess. The conus medullaris terminates at L1.  Grade 1 anterolisthesis of L3 on L4 and L4 on L5 is unchanged. Sequelae of prior laminectomies are again identified from L2-3 to L4-5. Mild lateral recess narrowing at L4-5 is unchanged. L5-S1 interbody fusion is noted. There is mild bilateral neural foraminal narrowing at L5-S1, right greater than left.  IMPRESSION: 1. Interval thoracolumbar posterior fusion. 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 2. Fluid collections at the L1-2 laminectomy site and superficially along the surgical incision, which most likely represent postoperative hematoma/seromas, although superimposed infection cannot be excluded. 3. The right T12 pedicle screw appears to traverse the right lateral recess.   Electronically Signed   By: Logan Bores   On: 04/28/2014 20:19    Lumbar CT wo contrast:  Results for orders placed during the hospital encounter of 05/07/15  CT Lumbar Spine Wo Contrast   Narrative CLINICAL DATA:  Intervertebral disc disorder with radiculopathy. Prior thoracolumbar fusion. LEFT leg pain.  EXAM: CT THORACIC AND LUMBAR SPINE WITHOUT CONTRAST  TECHNIQUE: Multidetector CT imaging of the thoracic and lumbar spine was performed without contrast. Multiplanar CT image reconstructions were also generated.  COMPARISON:  MRI of the thoracic and lumbar spine 04/28/2014.  FINDINGS: CT THORACIC SPINE FINDINGS  T10 through L3 fusion. Loosening of the BILATERAL T10 screws. Slight medial placement of the RIGHT T11 and T12 screws. Solid posterior fusion  at T12-L1, but no evidence of T10-11 or T11-12  mature arthrodesis. Multilevel advanced disc space narrowing throughout the thoracic spine without worrisome osseous lesion or compression fracture. This appearance is similar to prior thoracic MR from 2015. Moderate C6-7 spondylosis.  CT LUMBAR SPINE FINDINGS  T10 through L3 fusion. Inferior aspect of the fusion construct is intact. The L1, L2, and L3 screws are unremarkable. Adequate posterior decompression at L1-2 and L2-3. Slight vacuum phenomenon in the L1-2 interspace non worrisome. No visible impingement at these levels.  At L3-4 there is 6 mm anterolisthesis. There is moderate disc space narrowing with calcification of the annulus posteriorly but no significant protrusion. Moderate posterior element hypertrophy. Solid appearing posterior fusion at this level. No impingement.  At L4-5 there is a central protrusion. Vacuum phenomenon is seen within the interspace. 3 mm anterolisthesis. Advanced facet arthropathy. Previous laminectomy with partial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

## 2016-07-12 NOTE — Patient Instructions (Signed)
INSTRUCTED TO GET LABWORK AND XRAYS DONE AT THE MEDICAL MALL AS SOON AS POSSIBLE.

## 2016-07-13 ENCOUNTER — Encounter: Payer: Self-pay | Admitting: Pain Medicine

## 2016-07-13 DIAGNOSIS — R1032 Left lower quadrant pain: Secondary | ICD-10-CM

## 2016-07-13 DIAGNOSIS — M961 Postlaminectomy syndrome, not elsewhere classified: Secondary | ICD-10-CM | POA: Insufficient documentation

## 2016-07-13 DIAGNOSIS — G8929 Other chronic pain: Secondary | ICD-10-CM | POA: Insufficient documentation

## 2016-07-16 LAB — 25-HYDROXYVITAMIN D LCMS D2+D3: 25-HYDROXY, VITAMIN D-3: 35 ng/mL

## 2016-07-16 LAB — 25-HYDROXY VITAMIN D LCMS D2+D3: 25-Hydroxy, Vitamin D: 35 ng/mL

## 2016-07-16 NOTE — Progress Notes (Signed)
Normal Vitamin B-12 levels are between 211 and 946 pg/mL, for our Lab. Medical conditions that can increase levels of vitamin B12 include liver disease, kidney failure and myeloproliferative disorders, which includes myelocytic leukemia and polycythemia vera.

## 2016-07-18 DIAGNOSIS — F4542 Pain disorder with related psychological factors: Secondary | ICD-10-CM | POA: Diagnosis not present

## 2016-07-19 ENCOUNTER — Encounter: Payer: Self-pay | Admitting: Pain Medicine

## 2016-07-19 ENCOUNTER — Ambulatory Visit: Payer: Commercial Managed Care - HMO | Attending: Pain Medicine | Admitting: Pain Medicine

## 2016-07-19 VITALS — BP 126/75 | HR 95 | Temp 98.3°F | Resp 16 | Ht 60.0 in | Wt 126.0 lb

## 2016-07-19 DIAGNOSIS — R1032 Left lower quadrant pain: Secondary | ICD-10-CM | POA: Diagnosis not present

## 2016-07-19 DIAGNOSIS — K5900 Constipation, unspecified: Secondary | ICD-10-CM | POA: Diagnosis not present

## 2016-07-19 DIAGNOSIS — E669 Obesity, unspecified: Secondary | ICD-10-CM | POA: Insufficient documentation

## 2016-07-19 DIAGNOSIS — G8929 Other chronic pain: Secondary | ICD-10-CM | POA: Diagnosis not present

## 2016-07-19 DIAGNOSIS — E785 Hyperlipidemia, unspecified: Secondary | ICD-10-CM | POA: Insufficient documentation

## 2016-07-19 DIAGNOSIS — M545 Low back pain: Secondary | ICD-10-CM

## 2016-07-19 DIAGNOSIS — M961 Postlaminectomy syndrome, not elsewhere classified: Secondary | ICD-10-CM | POA: Diagnosis not present

## 2016-07-19 DIAGNOSIS — M5116 Intervertebral disc disorders with radiculopathy, lumbar region: Secondary | ICD-10-CM | POA: Diagnosis not present

## 2016-07-19 DIAGNOSIS — M47816 Spondylosis without myelopathy or radiculopathy, lumbar region: Secondary | ICD-10-CM | POA: Insufficient documentation

## 2016-07-19 DIAGNOSIS — F329 Major depressive disorder, single episode, unspecified: Secondary | ICD-10-CM | POA: Insufficient documentation

## 2016-07-19 DIAGNOSIS — F1190 Opioid use, unspecified, uncomplicated: Secondary | ICD-10-CM

## 2016-07-19 DIAGNOSIS — M160 Bilateral primary osteoarthritis of hip: Secondary | ICD-10-CM | POA: Diagnosis not present

## 2016-07-19 DIAGNOSIS — Z79899 Other long term (current) drug therapy: Secondary | ICD-10-CM | POA: Insufficient documentation

## 2016-07-19 DIAGNOSIS — F419 Anxiety disorder, unspecified: Secondary | ICD-10-CM | POA: Diagnosis not present

## 2016-07-19 DIAGNOSIS — Z79891 Long term (current) use of opiate analgesic: Secondary | ICD-10-CM | POA: Diagnosis not present

## 2016-07-19 DIAGNOSIS — M549 Dorsalgia, unspecified: Secondary | ICD-10-CM | POA: Insufficient documentation

## 2016-07-19 DIAGNOSIS — T402X5A Adverse effect of other opioids, initial encounter: Secondary | ICD-10-CM | POA: Diagnosis not present

## 2016-07-19 DIAGNOSIS — K5903 Drug induced constipation: Secondary | ICD-10-CM | POA: Diagnosis not present

## 2016-07-19 DIAGNOSIS — M4806 Spinal stenosis, lumbar region: Secondary | ICD-10-CM | POA: Insufficient documentation

## 2016-07-19 DIAGNOSIS — M25552 Pain in left hip: Secondary | ICD-10-CM | POA: Insufficient documentation

## 2016-07-19 MED ORDER — LUBIPROSTONE 8 MCG PO CAPS: 8.0000 ug | ORAL_CAPSULE | Freq: Two times a day (BID) | ORAL | 99 refills | Status: DC

## 2016-07-19 MED ORDER — NALOXONE HCL 2 MG/2ML IJ SOSY: PREFILLED_SYRINGE | INTRAMUSCULAR | 1 refills | Status: DC

## 2016-07-19 MED ORDER — BENEFIBER PO POWD: ORAL | 99 refills | Status: DC

## 2016-07-19 MED ORDER — OXYCODONE HCL 10 MG PO TABS
10.0000 mg | ORAL_TABLET | Freq: Four times a day (QID) | ORAL | 0 refills | Status: DC | PRN
Start: 2016-07-19 — End: 2016-08-16

## 2016-07-19 NOTE — Patient Instructions (Signed)
GENERAL RISKS AND COMPLICATIONS  What are the risk, side effects and possible complications? Generally speaking, most procedures are safe.  However, with any procedure there are risks, side effects, and the possibility of complications.  The risks and complications are dependent upon the sites that are lesioned, or the type of nerve block to be performed.  The closer the procedure is to the spine, the more serious the risks are.  Great care is taken when placing the radio frequency needles, block needles or lesioning probes, but sometimes complications can occur. 1. Infection: Any time there is an injection through the skin, there is a risk of infection.  This is why sterile conditions are used for these blocks.  There are four possible types of infection. 1. Localized skin infection. 2. Central Nervous System Infection-This can be in the form of Meningitis, which can be deadly. 3. Epidural Infections-This can be in the form of an epidural abscess, which can cause pressure inside of the spine, causing compression of the spinal cord with subsequent paralysis. This would require an emergency surgery to decompress, and there are no guarantees that the patient would recover from the paralysis. 4. Discitis-This is an infection of the intervertebral discs.  It occurs in about 1% of discography procedures.  It is difficult to treat and it may lead to surgery.        2. Pain: the needles have to go through skin and soft tissues, will cause soreness.       3. Damage to internal structures:  The nerves to be lesioned may be near blood vessels or    other nerves which can be potentially damaged.       4. Bleeding: Bleeding is more common if the patient is taking blood thinners such as  aspirin, Coumadin, Ticiid, Plavix, etc., or if he/she have some genetic predisposition  such as hemophilia. Bleeding into the spinal canal can cause compression of the spinal  cord with subsequent paralysis.  This would require an  emergency surgery to  decompress and there are no guarantees that the patient would recover from the  paralysis.       5. Pneumothorax:  Puncturing of a lung is a possibility, every time a needle is introduced in  the area of the chest or upper back.  Pneumothorax refers to free air around the  collapsed lung(s), inside of the thoracic cavity (chest cavity).  Another two possible  complications related to a similar event would include: Hemothorax and Chylothorax.   These are variations of the Pneumothorax, where instead of air around the collapsed  lung(s), you may have blood or chyle, respectively.       6. Spinal headaches: They may occur with any procedures in the area of the spine.       7. Persistent CSF (Cerebro-Spinal Fluid) leakage: This is a rare problem, but may occur  with prolonged intrathecal or epidural catheters either due to the formation of a fistulous  track or a dural tear.       8. Nerve damage: By working so close to the spinal cord, there is always a possibility of  nerve damage, which could be as serious as a permanent spinal cord injury with  paralysis.       9. Death:  Although rare, severe deadly allergic reactions known as "Anaphylactic  reaction" can occur to any of the medications used.      10. Worsening of the symptoms:  We can always make thing worse.    What are the chances of something like this happening? Chances of any of this occuring are extremely low.  By statistics, you have more of a chance of getting killed in a motor vehicle accident: while driving to the hospital than any of the above occurring .  Nevertheless, you should be aware that they are possibilities.  In general, it is similar to taking a shower.  Everybody knows that you can slip, hit your head and get killed.  Does that mean that you should not shower again?  Nevertheless always keep in mind that statistics do not mean anything if you happen to be on the wrong side of them.  Even if a procedure has a 1  (one) in a 1,000,000 (million) chance of going wrong, it you happen to be that one..Also, keep in mind that by statistics, you have more of a chance of having something go wrong when taking medications.  Who should not have this procedure? If you are on a blood thinning medication (e.g. Coumadin, Plavix, see list of "Blood Thinners"), or if you have an active infection going on, you should not have the procedure.  If you are taking any blood thinners, please inform your physician.  How should I prepare for this procedure?  Do not eat or drink anything at least six hours prior to the procedure.  Bring a driver with you .  It cannot be a taxi.  Come accompanied by an adult that can drive you back, and that is strong enough to help you if your legs get weak or numb from the local anesthetic.  Take all of your medicines the morning of the procedure with just enough water to swallow them.  If you have diabetes, make sure that you are scheduled to have your procedure done first thing in the morning, whenever possible.  If you have diabetes, take only half of your insulin dose and notify our nurse that you have done so as soon as you arrive at the clinic.  If you are diabetic, but only take blood sugar pills (oral hypoglycemic), then do not take them on the morning of your procedure.  You may take them after you have had the procedure.  Do not take aspirin or any aspirin-containing medications, at least eleven (11) days prior to the procedure.  They may prolong bleeding.  Wear loose fitting clothing that may be easy to take off and that you would not mind if it got stained with Betadine or blood.  Do not wear any jewelry or perfume  Remove any nail coloring.  It will interfere with some of our monitoring equipment.  NOTE: Remember that this is not meant to be interpreted as a complete list of all possible complications.  Unforeseen problems may occur.  BLOOD THINNERS The following drugs  contain aspirin or other products, which can cause increased bleeding during surgery and should not be taken for 2 weeks prior to and 1 week after surgery.  If you should need take something for relief of minor pain, you may take acetaminophen which is found in Tylenol,m Datril, Anacin-3 and Panadol. It is not blood thinner. The products listed below are.  Do not take any of the products listed below in addition to any listed on your instruction sheet.  A.P.C or A.P.C with Codeine Codeine Phosphate Capsules #3 Ibuprofen Ridaura  ABC compound Congesprin Imuran rimadil  Advil Cope Indocin Robaxisal  Alka-Seltzer Effervescent Pain Reliever and Antacid Coricidin or Coricidin-D  Indomethacin Rufen    Alka-Seltzer plus Cold Medicine Cosprin Ketoprofen S-A-C Tablets  Anacin Analgesic Tablets or Capsules Coumadin Korlgesic Salflex  Anacin Extra Strength Analgesic tablets or capsules CP-2 Tablets Lanoril Salicylate  Anaprox Cuprimine Capsules Levenox Salocol  Anexsia-D Dalteparin Magan Salsalate  Anodynos Darvon compound Magnesium Salicylate Sine-off  Ansaid Dasin Capsules Magsal Sodium Salicylate  Anturane Depen Capsules Marnal Soma  APF Arthritis pain formula Dewitt's Pills Measurin Stanback  Argesic Dia-Gesic Meclofenamic Sulfinpyrazone  Arthritis Bayer Timed Release Aspirin Diclofenac Meclomen Sulindac  Arthritis pain formula Anacin Dicumarol Medipren Supac  Analgesic (Safety coated) Arthralgen Diffunasal Mefanamic Suprofen  Arthritis Strength Bufferin Dihydrocodeine Mepro Compound Suprol  Arthropan liquid Dopirydamole Methcarbomol with Aspirin Synalgos  ASA tablets/Enseals Disalcid Micrainin Tagament  Ascriptin Doan's Midol Talwin  Ascriptin A/D Dolene Mobidin Tanderil  Ascriptin Extra Strength Dolobid Moblgesic Ticlid  Ascriptin with Codeine Doloprin or Doloprin with Codeine Momentum Tolectin  Asperbuf Duoprin Mono-gesic Trendar  Aspergum Duradyne Motrin or Motrin IB Triminicin  Aspirin  plain, buffered or enteric coated Durasal Myochrisine Trigesic  Aspirin Suppositories Easprin Nalfon Trillsate  Aspirin with Codeine Ecotrin Regular or Extra Strength Naprosyn Uracel  Atromid-S Efficin Naproxen Ursinus  Auranofin Capsules Elmiron Neocylate Vanquish  Axotal Emagrin Norgesic Verin  Azathioprine Empirin or Empirin with Codeine Normiflo Vitamin E  Azolid Emprazil Nuprin Voltaren  Bayer Aspirin plain, buffered or children's or timed BC Tablets or powders Encaprin Orgaran Warfarin Sodium  Buff-a-Comp Enoxaparin Orudis Zorpin  Buff-a-Comp with Codeine Equegesic Os-Cal-Gesic   Buffaprin Excedrin plain, buffered or Extra Strength Oxalid   Bufferin Arthritis Strength Feldene Oxphenbutazone   Bufferin plain or Extra Strength Feldene Capsules Oxycodone with Aspirin   Bufferin with Codeine Fenoprofen Fenoprofen Pabalate or Pabalate-SF   Buffets II Flogesic Panagesic   Buffinol plain or Extra Strength Florinal or Florinal with Codeine Panwarfarin   Buf-Tabs Flurbiprofen Penicillamine   Butalbital Compound Four-way cold tablets Penicillin   Butazolidin Fragmin Pepto-Bismol   Carbenicillin Geminisyn Percodan   Carna Arthritis Reliever Geopen Persantine   Carprofen Gold's salt Persistin   Chloramphenicol Goody's Phenylbutazone   Chloromycetin Haltrain Piroxlcam   Clmetidine heparin Plaquenil   Cllnoril Hyco-pap Ponstel   Clofibrate Hydroxy chloroquine Propoxyphen         Before stopping any of these medications, be sure to consult the physician who ordered them.  Some, such as Coumadin (Warfarin) are ordered to prevent or treat serious conditions such as "deep thrombosis", "pumonary embolisms", and other heart problems.  The amount of time that you may need off of the medication may also vary with the medication and the reason for which you were taking it.  If you are taking any of these medications, please make sure you notify your pain physician before you undergo any  procedures.         Facet Blocks Patient Information  Description: The facets are joints in the spine between the vertebrae.  Like any joints in the body, facets can become irritated and painful.  Arthritis can also effect the facets.  By injecting steroids and local anesthetic in and around these joints, we can temporarily block the nerve supply to them.  Steroids act directly on irritated nerves and tissues to reduce selling and inflammation which often leads to decreased pain.  Facet blocks may be done anywhere along the spine from the neck to the low back depending upon the location of your pain.   After numbing the skin with local anesthetic (like Novocaine), a small needle is passed onto the facet joints under x-ray guidance.    You may experience a sensation of pressure while this is being done.  The entire block usually lasts about 15-25 minutes.   Conditions which may be treated by facet blocks:   Low back/buttock pain  Neck/shoulder pain  Certain types of headaches  Preparation for the injection:  1. Do not eat any solid food or dairy products within 8 hours of your appointment. 2. You may drink clear liquid up to 3 hours before appointment.  Clear liquids include water, black coffee, juice or soda.  No milk or cream please. 3. You may take your regular medication, including pain medications, with a sip of water before your appointment.  Diabetics should hold regular insulin (if taken separately) and take 1/2 normal NPH dose the morning of the procedure.  Carry some sugar containing items with you to your appointment. 4. A driver must accompany you and be prepared to drive you home after your procedure. 5. Bring all your current medications with you. 6. An IV may be inserted and sedation may be given at the discretion of the physician. 7. A blood pressure cuff, EKG and other monitors will often be applied during the procedure.  Some patients may need to have extra oxygen  administered for a short period. 8. You will be asked to provide medical information, including your allergies and medications, prior to the procedure.  We must know immediately if you are taking blood thinners (like Coumadin/Warfarin) or if you are allergic to IV iodine contrast (dye).  We must know if you could possible be pregnant.  Possible side-effects:   Bleeding from needle site  Infection (rare, may require surgery)  Nerve injury (rare)  Numbness & tingling (temporary)  Difficulty urinating (rare, temporary)  Spinal headache (a headache worse with upright posture)  Light-headedness (temporary)  Pain at injection site (serveral days)  Decreased blood pressure (rare, temporary)  Weakness in arm/leg (temporary)  Pressure sensation in back/neck (temporary)   Call if you experience:   Fever/chills associated with headache or increased back/neck pain  Headache worsened by an upright position  New onset, weakness or numbness of an extremity below the injection site  Hives or difficulty breathing (go to the emergency room)  Inflammation or drainage at the injection site(s)  Severe back/neck pain greater than usual  New symptoms which are concerning to you  Please note:  Although the local anesthetic injected can often make your back or neck feel good for several hours after the injection, the pain will likely return. It takes 3-7 days for steroids to work.  You may not notice any pain relief for at least one week.  If effective, we will often do a series of 2-3 injections spaced 3-6 weeks apart to maximally decrease your pain.  After the initial series, you may be a candidate for a more permanent nerve block of the facets.  If you have any questions, please call [phone] [facility] 

## 2016-07-19 NOTE — Progress Notes (Signed)
Patient's Name: Brittany Mays  MRN: [id number]  Referring Provider: [name], MD  DOB: 1941/05/18  PCP: Brittany Rumps, MD  DOS: 07/19/2016  Note by: Brittany Mays. Brittany Arbour, MD  Service setting: Ambulatory outpatient  Specialty: Interventional Pain Management  Location: [facility] (AMB) Pain Management Facility    Patient type: Established   Primary Reason(s) for Visit: Encounter for evaluation before starting new chronic pain management plan of care (Level of risk: moderate) CC: Back Pain and Hip Pain (left)  HPI  Ms. Brittany Mays is a 75 y.o. year old, female patient, who returns today as an established patient. She has Anxiety and depression; Obesity (BMI 30-39.9); Lumbar spinal stenosis; History of lumbar fusion (T10 through L3 fusion); Generalized weakness; Mechanical complication of internal fixation device such as nail, plate or rod ([place]); Pseudarthrosis after fusion or arthrodesis; Solitary pulmonary nodule; Chronic hip pain (Location of Secondary source of pain) (Left); Hyperlipidemia; Aortic aneurysm ([place]); Osteoarthritis, multiple sites; Chronic pain; Chronic low back pain (Location of Primary Source of Pain) (Bilateral) (R>L); Failed back surgical syndrome (x4); Chronic knee pain (Bilateral) (L>R); Long term current use of opiate analgesic; Long term prescription opiate use; Opiate use (60 MME/Day); Encounter for pain management planning; Encounter for therapeutic drug level monitoring; Abnormal CT scan, lumbar spine (05/07/2015); 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 Her primarily concern today  is the Back Pain and Hip Pain (left)  Pain Assessment: Self-Reported Pain Score: 4  Clinically the patient looks like a 2/10 Reported level is inconsistent with clinical observations. Information on the proper use of the pain score provided to the patient today. Pain Type: Chronic pain Pain Location: Back Pain Orientation: Upper, Mid, Lower Pain Descriptors / Indicators: Sharp, Aching (annoying) Pain Frequency: Constant  The patient comes into the clinics today for post-procedure evaluation on the interventional treatment done on 07/12/2016. In addition, she comes in today for pharmacological management of her chronic pain.  The patient  reports that she does not use drugs.  Date of Last Visit: 07/12/16 Service Provided on Last Visit: Evaluation  Controlled Substance Pharmacotherapy Assessment & REMS (Risk Evaluation and Mitigation Strategy)  Analgesic: Oxycodone IR 10 mg every 6 hours (40 mg/day of oxycodone) MME/day: 60 mg/day Pill Count: Today were taken over the patient's medication regimen and therefore she has no pills or prior medications from [place]. Pharmacokinetics: Onset of action (Liberation/Absorption): Within expected pharmacological parameters Time to Peak effect (Distribution): Timing and results are as within normal expected parameters Duration of action (Metabolism/Excretion): Within normal limits for medication Pharmacodynamics: Analgesic Effect: More than 50% Activity Facilitation: Medication(s) allow patient to sit, stand, walk, and do the basic ADLs Perceived Effectiveness: Described as relatively effective, allowing for increase in activities of daily living (ADL) Side-effects or Adverse reactions: None reported Monitoring: [place] PMP: Online review of the past 44-month period conducted. Compliant with practice rules and regulations Last UDS on record: Summary  Date Value Ref Range Status  07/12/2016 FINAL  Final    Comment:     ==================================================================== [facility] COMP DRUG ANALYSIS,UR ==================================================================== Test  Result       Flag       Units Drug Present and Declared for Prescription Verification   Oxycodone                      7500         EXPECTED   ng/mg creat   Oxymorphone                    7340         EXPECTED   ng/mg creat   Noroxycodone                   >11765       EXPECTED   ng/mg creat   Noroxymorphone                 918          EXPECTED   ng/mg creat    Sources of oxycodone are scheduled prescription medications.    Oxymorphone, noroxycodone, and noroxymorphone are expected    metabolites of oxycodone. Oxymorphone is also available as a    scheduled prescription medication.   Gabapentin                     PRESENT      EXPECTED   Duloxetine                     PRESENT      EXPECTED   Acetaminophen                  PRESENT      EXPECTED   Diphenhydramine                PRESENT      EXPECTED ==================================================================== Test                      Result    Flag   Units      Ref Range   Creatinine              85               mg/dL      >=20 ==================================================================== Declared Medications:  The flagging and interpretation on this report are based on the  following declared medications.  Unexpected results may arise from  inaccuracies in the declared medications.  **Note: The testing scope of this panel includes these medications:  Diphenhydramine (Tylenol PM)  Duloxetine (Cymbalta)  Gabapentin (Neurontin)  Oxycodone  **Note: The testing scope of this panel does not include small to  moderate amounts of these reported medications:  Acetaminophen (Tylenol PM)  **Note: The testing scope of this panel does not include following  reported medications:  Chondroitin (Glucosamine-Chondroitin)   Cyanocobalamin  Glucosamine (Glucosamine-Chondroitin)  Melatonin  Multivitamin (MVI)  Sennosides (Senna-Lax)  Supplement (Red Yeast Rice)  Vitamin C ==================================================================== For clinical consultation, please call [phone]. ====================================================================    UDS interpretation: Compliant          Medication Assessment Form: Reviewed. Patient indicates being compliant with therapy Treatment compliance: Not applicable yet Risk Assessment: Aberrant Behavior: None observed today Substance Use Disorder (SUD) Risk Level: Low Risk of opioid abuse or dependence: 0.7-3.0% with doses ? 36 MME/day and 6.1-26% with doses ? 120 MME/day. Opioid Risk Tool (ORT) Score:  1 Low Risk for SUD (Score <3) Depression Scale Score: PHQ-2: PHQ-2 Total Score: 0 No  depression (0) PHQ-9: PHQ-9 Total Score: 0 No depression (0-4)  Pharmacologic Plan: Today we may be taking over the patient's pharmacological regimen. See below  Previous Illicit Drug Screen Labs(s): Lab Results  Component Value Date   COCAINSCRNUR NONE DETECTED 04/29/2014   THCU NONE DETECTED 04/29/2014    Laboratory Chemistry  Inflammation Markers Lab Results  Component Value Date   ESRSEDRATE 6 07/12/2016   CRP 0.6 07/12/2016    Renal Function Lab Results  Component Value Date   BUN 14 07/12/2016   CREATININE 0.70 07/12/2016   GFRAA >60 07/12/2016   GFRNONAA >60 07/12/2016    Hepatic Function Lab Results  Component Value Date   AST 25 07/12/2016   ALT 18 07/12/2016   ALBUMIN 4.3 07/12/2016    Electrolytes Lab Results  Component Value Date   NA 140 07/12/2016   K 4.1 07/12/2016   CL 102 07/12/2016   CALCIUM 9.1 07/12/2016   MG 2.0 07/12/2016    Pain Modulating Vitamins Lab Results  Component Value Date   25OHVITD1 35 07/12/2016   25OHVITD2 <1.0 07/12/2016   25OHVITD3 35 07/12/2016   VITAMINB12 1,782 (H) 07/12/2016     Coagulation Parameters Lab Results  Component Value Date   PLT 168.0 09/07/2015    Cardiovascular Lab Results  Component Value Date   HGB 14.4 09/07/2015   HCT 44.0 09/07/2015   Note: Lab results reviewed.  Recent Diagnostic Imaging  Thoracic Imaging: Thoracic MR w/wo contrast:  Results for orders placed during the hospital encounter of 04/28/14  MR Thoracic Spine W Wo Contrast   Narrative CLINICAL DATA:  Thoracic and lumbar spine surgery on 06/15. Worsening pain. Altered mental status.  EXAM: MRI THORACIC AND LUMBAR SPINE WITHOUT AND WITH CONTRAST  TECHNIQUE: Multiplanar and multiecho pulse sequences of the thoracic and lumbar spine were obtained without and with intravenous contrast.  CONTRAST:  87mL MULTIHANCE GADOBENATE DIMEGLUMINE 529 MG/ML IV SOLN  COMPARISON:  Lumbar spine MRI 02/27/2014  FINDINGS: Images are mildly two moderately degraded by motion artifact.  Thoracic vertebral alignment is normal. Thoracic vertebral body heights are preserved. There is mild to moderate disc space narrowing with associated degenerative marrow changes from T4-T9. The thoracic spinal cord is normal in caliber and signal although is partially obscured by susceptibility artifact in the lower thoracic spine. A small amount of patchy signal abnormality is noted in the dependent right lung, incompletely evaluated but may reflect subsegmental atelectasis.  Patient's prior posterior lumbar fusion has been extended proximally since the prior MRI, with bilateral pedicle screws now present from T10-L3. Superficial gas and fluid collection along the surgical incision measures approximately 12 cm in craniocaudal length and has greatest axial measurements of 2.4 x 2.6 cm at the T11 level. There is mild surrounding enhancement. Fluid collection at the L1-2 laminectomy site measures 6.7 x 2.4 cm (transverse by AP). Small dorsal epidural fluid collection/hematoma at the L1-2 level  measures 15 x 6 mm (series 11, image 26). This fluid collection and a circumferential L1-2 disc bulge result in moderate spinal stenosis. A small amount of dorsal epidural fluid/hematoma at L2-3 measures 10 x 5 mm without stenosis.  Evaluation of the spinal canal in the lower thoracic spine is limited by susceptibility artifact from fixation hardware. The right T12 pedicle screw appears to traverse the right lateral recess. The conus medullaris terminates at L1.  Grade 1 anterolisthesis of L3 on L4 and L4 on L5 is unchanged. Sequelae of prior laminectomies are again identified from L2-3 to  L4-5. Mild lateral recess narrowing at L4-5 is unchanged. L5-S1 interbody fusion is noted. There is mild bilateral neural foraminal narrowing at L5-S1, right greater than left.  IMPRESSION: 1. Interval thoracolumbar posterior fusion. 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 2. Fluid collections at the L1-2 laminectomy site and superficially along the surgical incision, which most likely represent postoperative hematoma/seromas, although superimposed infection cannot be excluded. 3. The right T12 pedicle screw appears to traverse the right lateral recess.   Electronically Signed   By: Brittany Mays   On: 04/28/2014 20:19    Thoracic CT wo contrast:  Results for orders placed during the hospital encounter of 05/07/15  CT Thoracic Spine Wo Contrast   Narrative CLINICAL DATA:  Intervertebral disc disorder with radiculopathy. Prior thoracolumbar fusion. LEFT leg pain.  EXAM: CT THORACIC AND LUMBAR SPINE WITHOUT CONTRAST  TECHNIQUE: Multidetector CT imaging of the thoracic and lumbar spine was performed without contrast. Multiplanar CT image reconstructions were also generated.  COMPARISON:  MRI of the thoracic and lumbar spine 04/28/2014.  FINDINGS: CT THORACIC SPINE FINDINGS  T10 through L3 fusion.  Loosening of the BILATERAL T10 screws. Slight medial placement of the RIGHT T11 and T12 screws. Solid posterior fusion at T12-L1, but no evidence of T10-11 or T11-12 mature arthrodesis. Multilevel advanced disc space narrowing throughout the thoracic spine without worrisome osseous lesion or compression fracture. This appearance is similar to prior thoracic MR from 2015. Moderate C6-7 spondylosis.  CT LUMBAR SPINE FINDINGS  T10 through L3 fusion. Inferior aspect of the fusion construct is intact. The L1, L2, and L3 screws are unremarkable. Adequate posterior decompression at L1-2 and L2-3. Slight vacuum phenomenon in the L1-2 interspace non worrisome. No visible impingement at these levels.  At L3-4 there is 6 mm anterolisthesis. There is moderate disc space narrowing with calcification of the annulus posteriorly but no significant protrusion. Moderate posterior element hypertrophy. Solid appearing posterior fusion at this level. No impingement.  At L4-5 there is a central protrusion. Vacuum phenomenon is seen within the interspace. 3 mm anterolisthesis. Advanced facet arthropathy. Previous laminectomy with partial regrowth of posterior elements. LEFT greater than RIGHT subarticular zone and foraminal narrowing could affect the nerve roots at this level.  At L5-S1 there is 2 mm of facet mediated slip. Severe disc space narrowing. Functional fusion posteriorly and across the interspace. No spinal stenosis or subarticular zone narrowing. BILATERAL foraminal narrowing relates to disc osteophyte complex and posterior element hypertrophy, affecting the LEFT and RIGHT L5 nerve roots.  Moderate atheromatous change of the aorta without aneurysmal dilatation. No paravertebral or renal mass.  Compared with MR from 04/28/2014, the appearance at L3-4 and L5-S1 is similar. Central protrusion at L4-5 May be increased. Previously documented fluid collection is  improved/resolved.  IMPRESSION: CT THORACIC SPINE IMPRESSION  No solid T10-11 or T11-12 arthrodesis. Slight medial placement of the RIGHT T11 and RIGHT T12 screws entering the lateral spinal canal.  BILATERAL T10 screw loosening.  CT LUMBAR SPINE IMPRESSION  Solid T12 through L3 fusion.  L3-4 fusion in a position of 6 mm anterolisthesis.  No impingement.  Central protrusion at L4-5 with 3 mm anterolisthesis. Partial regrowth of posterior elements. LEFT greater than RIGHT neural impingement is likely.  Facet mediated slip at L5-S1.  BILATERAL foraminal narrowing.   Electronically Signed   By: Brittany Mays M.D.   On: 05/07/2015 13:05    Lumbosacral Imaging: Lumbar MR wo contrast:  Results for orders placed during the hospital encounter of 02/27/14  MR Lumbar Spine Wo Contrast   Narrative CLINICAL DATA:  LBP Severe low back pain radiating into the right hip and gluteal region for 3 weeks. History of prior spinal fusion in 2010.  EXAM: MRI LUMBAR SPINE WITHOUT CONTRAST  TECHNIQUE: Multiplanar, multisequence MR imaging was performed. No intravenous contrast was administered.  COMPARISON:  MR L SPINE W/O dated 03/16/2008  FINDINGS: Numbering used on prior exam is preserved. Vertebral body height is within normal limits. L5-S1 solid fusion. Spinal cord terminates posterior to the L1 vertebra. grade I anterolisthesis of L3 on L4 measures 6 mm. 3 mm of anterolisthesis of L4 on L5. Progressive degenerative disease is present. L2-L3 rod and screw fixation. Paraspinal soft tissues appear within normal limits. Respiratory motion artifact is present on the axial images. There is some clumping of nerve roots at the L3-L4 level compatible with focal arachnoiditis. Lower thoracic levels show disc degeneration but no stenosis.  L1-L2: Severe central stenosis. This is compatible with adjacent segment disease. The disc is severely degenerated and there is a broad-based  posterior protrusion. Additionally, posterior ligamentum flavum redundancy is present. Narrowing of both lateral recesses. Mild bilateral foraminal stenosis.  L2-L3: Laminectomy.  Central canal and foramina appear decompressed.  L3-L4: Laminectomy. Central canal decompressed. Foramina patent. There appears to be solid posterior lateral fusion.  L4-L5: Laminectomy. Mild residual/ recurrent central stenosis. Left lateral recess encroachment potentially affecting the descending left L5 nerve. Facet joints appear to remain open. Mild uncoverage of the disc contributes to the central stenosis. Mild left foraminal stenosis secondary to bulging disc and anterolisthesis.  L5-S1: Solid fusion.  No stenosis.  IMPRESSION: 1. Interval L2-L3 partial discectomy and rod and pedicle screw fixation. No recurrent stenosis. 2. 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 3. L3-L4 laminectomy without recurrent stenosis. Probable solid posterior lateral fusion. L5-S1 solid fusion. 4. L4-L5 unchanged degenerative disease with mild left lateral recess and left foraminal stenosis.   Electronically Signed   By: Brittany Mays M.D.   On: 02/27/2014 18:33    Lumbar MR w/wo contrast:  Results for orders placed during the hospital encounter of 04/28/14  MR Lumbar Spine W Wo Contrast   Narrative CLINICAL DATA:  Thoracic and lumbar spine surgery on 06/15. Worsening pain. Altered mental status.  EXAM: MRI THORACIC AND LUMBAR SPINE WITHOUT AND WITH CONTRAST  TECHNIQUE: Multiplanar and multiecho pulse sequences of the thoracic and lumbar spine were obtained without and with intravenous contrast.  CONTRAST:  21mL MULTIHANCE GADOBENATE DIMEGLUMINE 529 MG/ML IV SOLN  COMPARISON:  Lumbar spine MRI 02/27/2014  FINDINGS: Images are mildly two moderately degraded by motion artifact.  Thoracic vertebral  alignment is normal. Thoracic vertebral body heights are preserved. There is mild to moderate disc space narrowing with associated degenerative marrow changes from T4-T9. The thoracic spinal cord is normal in caliber and signal although is partially obscured by susceptibility artifact in the lower thoracic spine. A small amount of patchy signal abnormality is noted in the dependent right lung, incompletely evaluated but may reflect subsegmental atelectasis.  Patient's prior posterior lumbar fusion has been extended proximally since the prior MRI, with bilateral pedicle screws now present from T10-L3. Superficial gas and fluid collection along the surgical incision measures approximately 12 cm in craniocaudal length and has greatest axial measurements of 2.4 x 2.6 cm at the T11 level. There is mild surrounding enhancement. Fluid  collection at the L1-2 laminectomy site measures 6.7 x 2.4 cm (transverse by AP). Small dorsal epidural fluid collection/hematoma at the L1-2 level measures 15 x 6 mm (series 11, image 26). This fluid collection and a circumferential L1-2 disc bulge result in moderate spinal stenosis. A small amount of dorsal epidural fluid/hematoma at L2-3 measures 10 x 5 mm without stenosis.  Evaluation of the spinal canal in the lower thoracic spine is limited by susceptibility artifact from fixation hardware. The right T12 pedicle screw appears to traverse the right lateral recess. The conus medullaris terminates at L1.  Grade 1 anterolisthesis of L3 on L4 and L4 on L5 is unchanged. Sequelae of prior laminectomies are again identified from L2-3 to L4-5. Mild lateral recess narrowing at L4-5 is unchanged. L5-S1 interbody fusion is noted. There is mild bilateral neural foraminal narrowing at L5-S1, right greater than left.  IMPRESSION: 1. Interval thoracolumbar posterior fusion. 15 mm dorsal epidural fluid collection/hematoma at L1-2 which, together with a disc  bulge, results in moderate spinal stenosis. 2. Fluid collections at the L1-2 laminectomy site and superficially along the surgical incision, which most likely represent postoperative hematoma/seromas, although superimposed infection cannot be excluded. 3. The right T12 pedicle screw appears to traverse the right lateral recess.   Electronically Signed   By: Brittany Mays   On: 04/28/2014 20:19    Lumbar CT wo contrast:  Results for orders placed during the hospital encounter of 05/07/15  CT Lumbar Spine Wo Contrast   Narrative CLINICAL DATA:  Intervertebral disc disorder with radiculopathy. Prior thoracolumbar fusion. LEFT leg pain.  EXAM: CT THORACIC AND LUMBAR SPINE WITHOUT CONTRAST  TECHNIQUE: Multidetector CT imaging of the thoracic and lumbar spine was performed without contrast. Multiplanar CT image reconstructions were also generated.  COMPARISON:  MRI of the thoracic and lumbar spine 04/28/2014.  FINDINGS: CT THORACIC SPINE FINDINGS  T10 through L3 fusion. Loosening of the BILATERAL T10 screws. Slight medial placement of the RIGHT T11 and T12 screws. Solid posterior fusion at T12-L1, but no evidence of T10-11 or T11-12 mature arthrodesis. Multilevel advanced disc space narrowing throughout the thoracic spine without worrisome osseous lesion or compression fracture. This appearance is similar to prior thoracic MR from 2015. Moderate C6-7 spondylosis.  CT LUMBAR SPINE FINDINGS  T10 through L3 fusion. Inferior aspect of the fusion construct is intact. The L1, L2, and L3 screws are unremarkable. Adequate posterior decompression at L1-2 and L2-3. Slight vacuum phenomenon in the L1-2 interspace non worrisome. No visible impingement at these levels.  At L3-4 there is 6 mm anterolisthesis. There is moderate disc space narrowing with calcification of the annulus posteriorly but no significant protrusion. Moderate posterior element hypertrophy. Solid appearing  posterior fusion at this level. No impingement.  At L4-5 there is a central protrusion. Vacuum phenomenon is seen within the interspace. 3 mm anterolisthesis. Advanced facet arthropathy. Previous laminectomy with partial regrowth of posterior elements. LEFT greater than RIGHT subarticular zone and foraminal narrowing could affect the nerve roots at this level.  At L5-S1 there is 2 mm of facet mediated slip. Severe disc space narrowing. Functional fusion posteriorly and across the interspace. No spinal stenosis or subarticular zone narrowing. BILATERAL foraminal narrowing relates to disc osteophyte complex and posterior element hypertrophy, affecting the LEFT and RIGHT L5 nerve roots.  Moderate atheromatous change of the aorta without aneurysmal dilatation. No paravertebral or renal mass.  Compared with MR from 04/28/2014, the appearance at L3-4 and L5-S1 is similar. Central protrusion at L4-5 May be  increased. Previously documented fluid collection is improved/resolved.  IMPRESSION: CT THORACIC SPINE IMPRESSION  No solid T10-11 or T11-12 arthrodesis. Slight medial placement of the RIGHT T11 and RIGHT T12 screws entering the lateral spinal canal.  BILATERAL T10 screw loosening.  CT LUMBAR SPINE IMPRESSION  Solid T12 through L3 fusion.  L3-4 fusion in a position of 6 mm anterolisthesis.  No impingement.  Central protrusion at L4-5 with 3 mm anterolisthesis. Partial regrowth of posterior elements. LEFT greater than RIGHT neural impingement is likely.  Facet mediated slip at L5-S1.  BILATERAL foraminal narrowing.   Electronically Signed   By: Brittany Mays M.D.   On: 05/07/2015 13:05    Lumbar DG Epidurogram DG:  Results for orders placed during the hospital encounter of 05/14/08  DG Epidurography   Narrative Clinical Data:  Better relief from the initial injection than the follow-up, which was at  a higher level.  She is having some recurrence of symptoms and elects  to repeat.   Procedure: The procedure, risks, benefits, and alternatives were explained to the patient. Questions regarding the procedure were encouraged and answered. The patient understands and consents to the procedure.   LUMBAR EPIDURAL INJECTION: An interlaminar approach was performed on the right at L5-S1.  The overlying skin was cleansed and anesthetized.  A 20 gauge Crawford epidural needle was advanced using loss-of-resistance technique.   DIAGNOSTIC EPIDURAL INJECTION: Injection of Omnipaque 180 shows a good epidural pattern with spread above and below the level of needle placement. No vascular opacification is seen.   THERAPEUTIC EPIDURAL INJECTION: 120mg  of Depo-Medrol mixed with 58ml lidocaine 1% were instilled.  The procedure was well-tolerated, and the patient was discharged thirty minutes following the injection in good condition.   Fluoroscopy Time: 22 seconds   Complications: none   IMPRESSION: Technically successful epidural injection on the right L5-S1.  Provider: Candis Mays   Hip Imaging: Hip-L MR wo contrast:  Results for orders placed during the hospital encounter of 04/11/15  MR Hip Left Wo Contrast   Narrative CLINICAL DATA:  Two-month history of left groin pain.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 "deep thrombosis", "pumonary embolisms", and other heart problems.  The amount of time that you may need off of the medication may also vary with the medication and the reason for which you were taking it.  If you are taking any of these medications, please make sure you notify your pain physician before you undergo any procedures.         Facet Blocks Patient Information  Description: The facets are joints in the spine between the vertebrae.  Like any joints in the body, facets can become irritated and painful.  Arthritis can also effect the facets.  By injecting steroids and local anesthetic in and around these joints, we can temporarily block the nerve supply to them.  Steroids act directly on irritated nerves and tissues to reduce selling and inflammation which often leads to decreased pain.  Facet blocks may be done anywhere along the spine from the neck to the low back depending upon the location of your pain.   After numbing the skin with local anesthetic (like Novocaine), a small needle is passed onto the facet joints under x-ray guidance.  You may experience a sensation of pressure while this is being done.  The entire block usually lasts about 15-25 minutes.   Conditions which may be treated by facet blocks:   Low back/buttock pain  Neck/shoulder pain  Certain types of headaches  Preparation for the injection:  1. Do not eat any solid food or dairy products within 8 hours of your appointment. 2. You may drink clear liquid up to 3  hours before appointment.  Clear liquids include water, black coffee, juice  or soda.  No milk or cream please. 3. You may take your regular medication, including pain medications, with a sip of water before your appointment.  Diabetics should hold regular insulin (if taken separately) and take 1/2 normal NPH dose the morning of the procedure.  Carry some sugar containing items with you to your appointment. 4. A driver must accompany you and be prepared to drive you home after your procedure. 5. Bring all your current medications with you. 6. An IV may be inserted and sedation may be given at the discretion of the physician. 7. A blood pressure cuff, EKG and other monitors will often be applied during the procedure.  Some patients may need to have extra oxygen administered for a short period. 8. You will be asked to provide medical information, including your allergies and medications, prior to the procedure.  We must know immediately if you are taking blood thinners (like Coumadin/Warfarin) or if you are allergic to IV iodine contrast (dye).  We must know if you could possible be pregnant.  Possible side-effects:   Bleeding from needle site  Infection (rare, may require surgery)  Nerve injury (rare)  Numbness & tingling (temporary)  Difficulty urinating (rare, temporary)  Spinal headache (a headache worse with upright posture)  Light-headedness (temporary)  Pain at injection site (serveral days)  Decreased blood pressure (rare, temporary)  Weakness in arm/leg (temporary)  Pressure sensation in back/neck (temporary)   Call if you experience:   Fever/chills associated with headache or increased back/neck pain  Headache worsened by an upright position  New onset, weakness or numbness of an extremity below the injection site  Hives or difficulty breathing (go to the emergency room)  Inflammation or drainage at the injection site(s)  Severe back/neck pain greater than usual  New symptoms which are concerning to you  Please note:  Although the  local anesthetic injected can often make your back or neck feel good for several hours after the injection, the pain will likely return. It takes 3-7 days for steroids to work.  You may not notice any pain relief for at least one week.  If effective, we will often do a series of 2-3 injections spaced 3-6 weeks apart to maximally decrease your pain.  After the initial series, you may be a candidate for a more permanent nerve block of the facets.  If you have any questions, please call #[phone]) [facility]

## 2016-07-19 NOTE — Progress Notes (Signed)
Safety precautions to be maintained throughout the outpatient stay will include: orient to surroundings, keep bed in low position, maintain call bell within reach at all times, provide assistance with transfer out of bed and ambulation.  

## 2016-07-20 ENCOUNTER — Other Ambulatory Visit: Payer: Self-pay

## 2016-07-20 LAB — COMPLIANCE DRUG ANALYSIS, UR

## 2016-08-01 ENCOUNTER — Telehealth: Payer: Self-pay

## 2016-08-01 NOTE — Telephone Encounter (Signed)
Wants to know status on getting approval for procedure.

## 2016-08-15 ENCOUNTER — Ambulatory Visit (HOSPITAL_BASED_OUTPATIENT_CLINIC_OR_DEPARTMENT_OTHER): Payer: Commercial Managed Care - HMO | Admitting: Pain Medicine

## 2016-08-15 ENCOUNTER — Encounter: Payer: Self-pay | Admitting: Pain Medicine

## 2016-08-15 ENCOUNTER — Ambulatory Visit
Admission: RE | Admit: 2016-08-15 | Discharge: 2016-08-15 | Disposition: A | Discharge status: Home / Self Care | Payer: Commercial Managed Care - HMO | Source: Ambulatory Visit | Attending: Pain Medicine | Admitting: Pain Medicine

## 2016-08-15 VITALS — BP 124/65 | HR 68 | Temp 98.0°F | Resp 16 | Ht 60.0 in | Wt 128.0 lb

## 2016-08-15 DIAGNOSIS — M25552 Pain in left hip: Secondary | ICD-10-CM | POA: Insufficient documentation

## 2016-08-15 DIAGNOSIS — F32A Depression, unspecified: Secondary | ICD-10-CM | POA: Insufficient documentation

## 2016-08-15 DIAGNOSIS — F329 Major depressive disorder, single episode, unspecified: Secondary | ICD-10-CM | POA: Insufficient documentation

## 2016-08-15 DIAGNOSIS — M545 Low back pain, unspecified: Secondary | ICD-10-CM

## 2016-08-15 DIAGNOSIS — M1288 Other specific arthropathies, not elsewhere classified, other specified site: Secondary | ICD-10-CM

## 2016-08-15 DIAGNOSIS — G8929 Other chronic pain: Secondary | ICD-10-CM | POA: Diagnosis not present

## 2016-08-15 DIAGNOSIS — M47816 Spondylosis without myelopathy or radiculopathy, lumbar region: Secondary | ICD-10-CM | POA: Insufficient documentation

## 2016-08-15 MED ORDER — ROPIVACAINE HCL 2 MG/ML IJ SOLN: 9.0000 mL | Freq: Once | INTRAMUSCULAR | Status: DC

## 2016-08-15 MED ORDER — MIDAZOLAM HCL 5 MG/5ML IJ SOLN: 1.0000 mg | INTRAMUSCULAR | Status: DC | PRN

## 2016-08-15 MED ORDER — ROPIVACAINE HCL 2 MG/ML IJ SOLN
INTRAMUSCULAR | Status: AC
  Administered 2016-08-15: 11:00:00
  Filled 2016-08-15: qty 20

## 2016-08-15 MED ORDER — TRIAMCINOLONE ACETONIDE 40 MG/ML IJ SUSP: 40.0000 mg | Freq: Once | INTRAMUSCULAR | Status: DC

## 2016-08-15 MED ORDER — LIDOCAINE HCL (PF) 1 % IJ SOLN: 10.0000 mL | Freq: Once | INTRAMUSCULAR | Status: DC

## 2016-08-15 MED ORDER — MIDAZOLAM HCL 5 MG/5ML IJ SOLN
INTRAMUSCULAR | Status: AC
Start: 2016-08-15 — End: 2016-08-15
  Administered 2016-08-15: 2 mg via INTRAVENOUS
  Filled 2016-08-15: qty 5

## 2016-08-15 MED ORDER — FENTANYL CITRATE (PF) 100 MCG/2ML IJ SOLN
INTRAMUSCULAR | Status: AC
  Administered 2016-08-15: 50 ug via INTRAVENOUS
  Filled 2016-08-15: qty 2

## 2016-08-15 MED ORDER — LACTATED RINGERS IV SOLN: 1000.0000 mL | Freq: Once | INTRAVENOUS | Status: DC

## 2016-08-15 MED ORDER — TRIAMCINOLONE ACETONIDE 40 MG/ML IJ SUSP
INTRAMUSCULAR | Status: AC
Start: 2016-08-15 — End: 2016-08-15
  Administered 2016-08-15: 11:00:00
  Filled 2016-08-15: qty 1

## 2016-08-15 MED ORDER — TRIAMCINOLONE ACETONIDE 40 MG/ML IJ SUSP
INTRAMUSCULAR | Status: AC
  Administered 2016-08-15: 11:00:00
  Filled 2016-08-15: qty 1

## 2016-08-15 MED ORDER — FENTANYL CITRATE (PF) 100 MCG/2ML IJ SOLN: 25.0000 ug | INTRAMUSCULAR | Status: DC | PRN

## 2016-08-15 NOTE — Progress Notes (Signed)
Patient's Name: Brittany Mays  MRN: [id number]  Referring Provider: Milinda Pointer, MD  DOB: [birth year]  PCP: [name], MD  DOS: 08/15/2016  Note by: Kathlen Brunswick. Dossie Arbour, MD  Service setting: Ambulatory outpatient  Location: [facility] (AMB) Pain Management Facility  Visit type: Procedure  Specialty: Interventional Pain Management  Patient type: Established   Primary Reason for Visit: Interventional Pain Management Treatment. CC: Back Pain (lower) and Hip Pain (left)  Procedure:  Anesthesia, Analgesia, Anxiolysis:  Type: Diagnostic Medial Branch Facet Block Region: Lumbar Level: L1, L2, & L3 Medial Branch Level(s) Laterality: Bilateral  Type: Moderate (Conscious) Sedation & Local Anesthesia Local Anesthetic: Lidocaine 1% Route: Intravenous (IV) IV Access: Secured Sedation: Meaningful verbal contact was maintained at all times during the procedure  Indication(s): Analgesia & Anxiolysis  Indications: 1. 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 Chronic low back pain (Location of Primary Source of Pain) (Bilateral) (R>L)   3. Lumbar spondylosis    Pain Score: Pre-procedure: 2 /10 Post-procedure: 2 /10  Pre-Procedure Assessment:  Brittany Mays is a 75 y.o. (year old), female patient, seen today for interventional treatment. She  has a past surgical history that includes Abdominal hysterectomy; Knee surgery (Bilateral, 1990's nd 2002); Spine surgery; Parathyroidectomy [id number]); colonoscopy (2013); Appendectomy; Breast lumpectomy; Tonsillectomy; and Back surgery (07-2015).. 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 Diagnoses of Chronic low back pain (Location of Primary Source of Pain) (Bilateral) (R>L) and Lumbar spondylosis were also pertinent to this visit.  Pain Type: Chronic pain Pain Location: Hip Pain Orientation: Left Pain Descriptors / Indicators: Sharp (mid back) Pain Frequency:  Constant  Date of Last Visit: 07/19/16 Service Provided on Last Visit: Med Refill  Coagulation Parameters Lab Results  Component Value Date   PLT 168.0 09/07/2015   Verification of the correct person, correct site (including marking of site), and correct procedure were performed and confirmed by the patient.  Consent: Before the procedure and under the influence of no sedative(s), amnesic(s), or anxiolytics, the patient was informed of the treatment options, risks and possible complications. To fulfill our ethical and legal obligations, as recommended by the American Medical Association's Code of Ethics, I have informed the patient of my clinical impression; the nature and purpose of the treatment or procedure; the risks, benefits, and possible complications of the intervention; the alternatives, including doing nothing; the risk(s) and benefit(s) of the alternative treatment(s) or procedure(s); and the risk(s) and benefit(s) of doing nothing. The patient was provided information about the general risks and possible complications associated with the procedure. These may include, but are not limited to: failure to achieve desired goals, infection, bleeding, organ or nerve damage, allergic reactions, paralysis, and death. In addition, the patient was informed of those risks and complications associated to Spine-related procedures, such as failure to decrease pain; infection (i.e.: Meningitis, epidural or intraspinal abscess); bleeding (i.e.: epidural hematoma, subarachnoid hemorrhage, or any other type of intraspinal or peri-dural bleeding); organ or nerve damage (i.e.: Any type of peripheral nerve, nerve root, or spinal cord injury) with subsequent damage to sensory, motor, and/or autonomic systems, resulting in permanent pain, numbness, and/or weakness of one or several areas of the body; allergic reactions; (i.e.: anaphylactic reaction); and/or death. Furthermore, the patient was informed of those  risks and complications associated with the medications. These include, but are not limited to: allergic reactions (i.e.: anaphylactic or anaphylactoid reaction(s)); adrenal axis suppression; blood sugar  elevation that in diabetics may result in ketoacidosis or comma; water retention that in patients with history of congestive heart failure may result in shortness of breath, pulmonary edema, and decompensation with resultant heart failure; weight gain; swelling or edema; medication-induced neural toxicity; particulate matter embolism and blood vessel occlusion with resultant organ, and/or nervous system infarction; and/or aseptic necrosis of one or more joints. Finally, the patient was informed that Medicine is not an exact science; therefore, there is also the possibility of unforeseen or unpredictable risks and/or possible complications that may result in a catastrophic outcome. The patient indicated having understood very clearly. We have given the patient no guarantees and we have made no promises. Enough time was given to the patient to ask questions, all of which were answered to the patient's satisfaction. Brittany Mays has indicated that she wanted to continue with the procedure.  Consent Attestation: I, the ordering provider, attest that I have discussed with the patient the benefits, risks, side-effects, alternatives, likelihood of achieving goals, and potential problems during recovery for the procedure that I have provided informed consent.  Pre-Procedure Preparation:  Safety Precautions: Allergies reviewed. The patient was asked about blood thinners, or active infections, both of which were denied. The patient was asked to confirm the procedure and laterality, before marking the site, and again before commencing the procedure. Appropriate site, procedure, and patient were confirmed by following the Joint Commission's Universal Protocol (UP.01.01.01), in the form of a "Time Out". The patient was  asked to participate by confirming the accuracy of the "Time Out" information. Patient was assessed for positional comfort and pressure points before starting the procedure. Allergies: She is allergic to nsaids; pentothal [thiopental]; and fentanyl.. Allergy Precautions: None required Infection Control Precautions: Sterile technique used. Standard Universal Precautions were taken as recommended by the Department of [facility] for Disease Control and Prevention (CDC). Standard pre-surgical skin prep was conducted. Respiratory hygiene and cough etiquette was practiced. Hand hygiene observed. Safe injection practices and needle disposal techniques followed. SDV (single dose vial) medications used. Medications properly checked for expiration dates and contaminants. Personal protective equipment (PPE) used as per protocol. Monitoring:  As per clinic protocol. Vitals:   08/15/16 1147 08/15/16 1205 08/15/16 1214 08/15/16 1226  BP: 99/82 122/66 121/70 124/65  Pulse: 73 68    Resp: 17 16 14 16   Temp:      TempSrc:      SpO2: 100% 98% 98% 98%  Weight:      Height:      Calculated BMI: Body mass index is 25 kg/m. Time-out: "Time-out" completed before starting procedure, as per protocol.  Description of Procedure Process:   Time-out: "Time-out" completed before starting procedure, as per protocol. Position: Prone Target Area: For Lumbar Facet blocks, the target is the groove formed by the junction of the transverse process and superior articular process. For the L5 dorsal ramus, the target is the notch between superior articular process and sacral ala. For the S1 dorsal ramus, the target is the superior and lateral edge of the posterior S1 Sacral foramen. Approach: Paramedial approach. Area Prepped: Entire Posterior Lumbosacral Region Prepping solution: ChloraPrep (2% chlorhexidine gluconate and 70% isopropyl alcohol) Safety Precautions: Aspiration looking for blood return was conducted  prior to all injections. At no point did we inject any substances, as a needle was being advanced. No attempts were made at seeking any paresthesias. Safe injection practices and needle disposal techniques used. Medications properly checked for expiration dates. SDV (single dose vial)  medications used. Description of the Procedure: Protocol guidelines were followed. The patient was placed in position over the fluoroscopy table. The target area was identified and the area prepped in the usual manner. Skin desensitized using vapocoolant spray. Skin & deeper tissues infiltrated with local anesthetic. Appropriate amount of time allowed to pass for local anesthetics to take effect. The procedure needle was introduced through the skin, ipsilateral to the reported pain, and advanced to the target area. Employing the "Medial Branch Technique", the needles were advanced to the angle made by the superior and medial portion of the transverse process, and the lateral and inferior portion of the superior articulating process of the targeted vertebral bodies. This area is known as "Burton's Eye" or the "Eye of the [place] Dog". A procedure needle was introduced through the skin, and this time advanced to the angle made by the superior and medial border of the sacral ala, and the lateral border of the S1 vertebral body. This last needle was later repositioned at the superior and lateral border of the posterior S1 foramen. Negative aspiration confirmed. Solution injected in intermittent fashion, asking for systemic symptoms every 0.5cc of injectate. The needles were then removed and the area cleansed, making sure to leave some of the prepping solution back to take advantage of its long term bactericidal properties. EBL: None Materials & Medications Used:  Needle(s) Used: 22g - 3.5" Spinal Needle(s)   Illustration of the posterior view of the lumbar spine and the posterior neural structures. Laminae of L2 through S1 are  labeled. DPRL5, dorsal primary ramus of L5; DPRS1, dorsal primary ramus of S1; DPR3, dorsal primary ramus of L3; FJ, facet (zygapophyseal) joint L3-L4; I, inferior articular process of L4; LB1, lateral branch of dorsal primary ramus of L1; IAB, inferior articular branches from L3 medial branch (supplies L4-L5 facet joint); IBP, intermediate branch plexus; MB3, medial branch of dorsal primary ramus of L3; NR3, third lumbar nerve root; S, superior articular process of L5; SAB, superior articular branches from L4 (supplies L4-5 facet joint also); TP3, transverse process of L3.  Imaging Guidance (Spinal):  Type of Imaging Technique: Fluoroscopy Guidance (Spinal) Indication(s): Assistance in needle guidance and placement for procedures requiring needle placement in or near specific anatomical locations not easily accessible without such assistance. Exposure Time: Please see nurses notes. Contrast: None used. Fluoroscopic Guidance: I was personally present during the use of fluoroscopy. "Tunnel Vision Technique" used to obtain the best possible view of the target area. Parallax error corrected before commencing the procedure. "Direction-depth-direction" technique used to introduce the needle under continuous pulsed fluoroscopy. Once target was reached, antero-posterior, oblique, and lateral fluoroscopic projection used confirm needle placement in all planes. Images permanently stored in EMR. Interpretation: No contrast injected. I personally interpreted the imaging intraoperatively. Adequate needle placement confirmed in multiple planes. Permanent images saved into the patient's record.  Antibiotic Prophylaxis:  Indication(s): No indications identified. Type:  Antibiotics Given (last 72 hours)    None      Post-operative Assessment:  Complications: No immediate post-treatment complications observed by team, or reported by patient. Disposition: The patient tolerated the entire procedure well. A repeat  set of vitals were taken after the procedure and the patient was kept under observation following institutional policy, for this type of procedure. Post-procedural neurological assessment was performed, showing return to baseline, prior to discharge. The patient was provided with post-procedure discharge instructions, including a section on how to identify potential problems. Should any problems arise concerning this procedure, the patient  was given instructions to immediately contact us, at any time, without hesitation. In any case, we plan to contact the patient by telephone for a follow-up status report regarding this interventional procedure. Comments:  No additional relevant information.  Plan of Care  Discharge to: Discharge home  Medications ordered for procedure: Meds ordered this encounter  Medications  . ropivacaine (PF) 2 mg/ml (0.2%) (NAROPIN) 2 MG/ML epidural    Sharlett Iles, Delores: cabinet override  . fentaNYL (SUBLIMAZE) 100 MCG/2ML injection    Sharlett Iles, Delores: cabinet override  . midazolam (VERSED) 5 MG/5ML injection    Sharlett Iles, Delores: cabinet override  . triamcinolone acetonide (KENALOG-40) 40 MG/ML injection    Sharlett Iles, Delores: cabinet override  . triamcinolone acetonide (KENALOG-40) 40 MG/ML injection    Sharlett Iles, Delores: cabinet override  . fentaNYL (SUBLIMAZE) injection 25-50 mcg    Make sure Narcan is available in the pyxis when using this medication. In the event of respiratory depression (RR< 8/min): Titrate NARCAN (naloxone) in increments of 0.1 to 0.2 mg IV at 2-3 minute intervals, until desired degree of reversal.  . lactated ringers infusion 1,000 mL  . midazolam (VERSED) 5 MG/5ML injection 1-2 mg    Make sure Flumazenil is available in the pyxis when using this medication. If oversedation occurs, administer 0.2 mg IV over 15 sec. If after 45 sec no response, administer 0.2 mg again over 1 min; may repeat at 1 min intervals; not to exceed 4 doses (1 mg)   . triamcinolone acetonide (KENALOG-40) injection 40 mg  . lidocaine (PF) (XYLOCAINE) 1 % injection 10 mL  . ropivacaine (PF) 2 mg/ml (0.2%) (NAROPIN) epidural 9 mL   Medications administered: (For more details, see medical record) We administered ropivacaine (PF) 2 mg/ml (0.2%), fentaNYL, midazolam, triamcinolone acetonide, and triamcinolone acetonide.  Imaging Ordered: No results found for this or any previous visit. New Prescriptions   No medications on file   Physician-requested Follow-up:  Return in about 2 weeks (around 08/29/2016) for Post-Procedure evaluation. In addition, return to clinic tomorrow for medication refill..  Future Appointments Date Time Provider [place]  08/16/2016 2:40 PM Milinda Pointer, MD [facility] None  09/06/2016 10:45 AM [name], MD [facility] None  09/13/2016 1:00 PM Milinda Pointer, MD [facility] None   Primary Care Physician: [name], MD Location: [facility] Out[facility] Note by: Kathlen Brunswick. Dossie Arbour, M.D, DABA, DABAPM, DABPM, DABIPP, FIPP  Disclaimer:  Medicine is not an exact science. The only guarantee in medicine is that nothing is guaranteed. It is important to note that the decision to proceed with this intervention was based on the information collected from the patient. The Data and conclusions were drawn from the patient's questionnaire, the interview, and the physical examination. Because the information was provided in large part by the patient, it cannot be guaranteed that it has not been purposely or unconsciously manipulated. Every effort has been made to obtain as much relevant data as possible for this evaluation. It is important to note that the conclusions that lead to this procedure are derived in large part from the available data. Always take into account that the treatment will also be dependent on availability of resources and existing treatment guidelines, considered by other Pain  Management Practitioners as being common knowledge and practice, at the time of the intervention. For Medico-Legal purposes, it is also important to point out that variation in procedural techniques and pharmacological choices are the acceptable norm. The indications, contraindications, technique, and results of the above procedure should  only be interpreted and judged by a Board-Certified Interventional Pain Specialist with extensive familiarity and expertise in the same exact procedure and technique. Attempts at providing opinions without similar or greater experience and expertise than that of the treating physician will be considered as inappropriate and unethical, and shall result in a formal complaint to the state medical board and applicable specialty societies.  Instructions provided at this appointment: Patient Instructions  Post-procedure Information What to expect: Most procedures involve the use of a local anesthetic (numbing medicine), and a steroid (anti-inflammatory medicine).  The local anesthetics may cause temporary numbness and weakness of the legs or arms, depending on the location of the block. This numbness/weakness may last 4-6 hours, depending on the local anesthetic used. In rare instances, it can last up to 24 hours. While numb, you must be very careful not to injure the extremity.  After any procedure, you could expect the pain to get better within 15-20 minutes. This relief is temporary and may last 4-6 hours. Once the local anesthetics wears off, you could experience discomfort, possibly more than usual, for up to 10 (ten) days. In the case of radiofrequencies, it may last up to 6 weeks. Surgeries may take up to 8 weeks for the healing process. The discomfort is due to the irritation caused by needles going through skin and muscle. To minimize the discomfort, we recommend using ice the first day, and heat from then on. The ice should be applied for 15 minutes on, and 15 minutes  off. Keep repeating this cycle until bedtime. Avoid applying the ice directly to the skin, to prevent frostbite. Heat should be used daily, until the pain improves (4-10 days). Be careful not to burn yourself.  Occasionally you may experience muscle spasms or cramps. These occur as a consequence of the irritation caused by the needle sticks to the muscle and the blood that will inevitably be lost into the surrounding muscle tissue. Blood tends to be very irritating to tissues, which tend to react by going into spasm. These spasms may start the same day of your procedure, but they may also take days to develop. This late onset type of spasm or cramp is usually caused by electrolyte imbalances triggered by the steroids, at the level of the kidney. Cramps and spasms tend to respond well to muscle relaxants, multivitamins (some are triggered by the procedure, but may have their origins in vitamin deficiencies), and "Gatorade", or any sports drinks that can replenish any electrolyte imbalances. (If you are a diabetic, ask your pharmacist to get you a sugar-free brand.) Warm showers or baths may also be helpful. Stretching exercises are highly recommended.  General Instructions:  Be alert for signs of possible infection: redness, swelling, heat, red streaks, elevated temperature, and/or fever. These typically appear 4 to 6 days after the procedure. Immediately notify your doctor if you experience unusual bleeding, difficulty breathing, or loss of bowel or bladder control. If you experience increased pain, do not increase your pain medicine intake, unless instructed by your pain physician.  Post-Procedure Care:  Be careful in moving about. Muscle spasms in the area of the injection may occur. Applying ice or heat to the area is often helpful. The incidence of spinal headaches after epidural injections ranges between 1.4% and 6%. If you develop a headache that does not seem to respond to conservative therapy, please  let your physician know. This can be treated with an epidural blood patch.   Post-procedure numbness or redness is to  be expected, however it should average 4 to 6 hours. If numbness and weakness of your extremities begins to develop 4 to 6 hours after your procedure, and is felt to be progressing and worsening, immediately contact your physician.  Diet:  If you experience nausea, do not eat until this sensation goes away. If you had a "Stellate Ganglion Block" for upper extremity "Reflex Sympathetic Dystrophy", do not eat or drink until your hoarseness goes away. In any case, always start with liquids first and if you tolerate them well, then slowly progress to more solid foods.  Activity:  For the first 4 to 6 hours after the procedure, use caution in moving about as you may experience numbness and/or weakness. Use caution in cooking, using household electrical appliances, and climbing steps. If you need to reach your Doctor call our office: [phone] (During business hours) or [phone] (After business hours).  Business Hours: Monday-Thursday 8:00 am - 4:00 PM    Fridays: Closed     In case of an emergency: In case of emergency, call 911 or go to the nearest emergency room and have the physician there call us.  Interpretation of Procedure Every nerve block has two components: a diagnostic component, and a treatment component. Unrealistic expectations are the most common causes of "perceived failure".  In a perfect world, a single nerve block should be able to completely and permanently eliminate the pain. Sadly, the world is not perfect.  Most pain management nerve blocks are performed using local anesthetics and steroids. Steroids are responsible for any long-term benefit that you may experience. Their purpose is to decrease any chronic swelling that may exist in the area. Steroids begin to work immediately after being injected. However, most patients will not experience any  benefits until 5 to 10 days after the injection, when the swelling has come down to the point where they can tell a difference. Steroids will only help if there is swelling to be treated. As such, they can assist with the diagnosis. If effective, they suggest an inflammatory component to the pain, and if ineffective, they rule out inflammation as the main cause or component of the problem. If the problem is one of mechanical compression, you will get no benefit from those steroids.   In the case of local anesthetics, they have a crucial role in the diagnosis of your condition. Most will begin to work within15 to 20 minutes after injection. The duration will depend on the type used (short- vs. Long-acting). It is of outmost importance that patients keep tract of their pain, after the procedure. To assist with this matter, a "Post-procedure Pain Diary" is provided. Make sure to complete it and to bring it back to your follow-up appointment.  As long as the patient keeps accurate, detailed records of their symptoms after every procedure, and returns to have those interpreted, every procedure will provide [place] with invaluable information. Even a block that does not provide the patient with any relief, will always provide [place] with information about the mechanism and the origin of the pain. The only time a nerve block can be considered a waste of time is when patients do not keep track of the results, or do not keep their post-procedure appointment.  Reporting the results back to your physician The Pain Score  Pain is a subjective complaint. It cannot be seen, touched, or measured. We depend entirely on the patient's report of the pain in order to assess your condition and treatment. To  evaluate the pain, we use a pain scale, where "0" means "No Pain", and a "10" is "the worst possible pain that you can even imagine" (i.e. something like been eaten alive by a shark or being torn apart by a lion).   Use the Pain  Scale provided. You will frequently be asked to rate your pain. Please be accurate, remember that medical decisions will be based on your responses. Please do not rate your pain above a 10. Doing so is actually interpreted as "symptom magnification" (exaggeration). To put this into perspective, when you tell us that your pain is at a 10 (ten), what you are saying is that there is nothing we can do to make this pain any worse. (Carefully think about that.)  Post Procedure Instructions:  The drugs you were given will stay in your system until tomorrow, so for the next 24 hours you should not drive, make any legal decisions or drink any alcoholic beverages.  You may eat anything you prefer, but it is better to start with liquids then soups and crackers, and gradually work up to solid foods.  Please notify your doctor immediately if you have any unusual bleeding, trouble breathing or pain that is not related to your normal pain.  Depending on the type of procedure that was done, some parts of your body may feel week and/or numb.  This usually clears up by tonight or the next day.  Walk with the use of an assistive device or accompanied by an adult for the 24 hours.  You may use ice on the affected area for the first 24 hours.  Put ice in a Ziploc bag and cover with a towel and place against area 15 minutes on 15 minutes off.  You may switch to heat after 24 hours.

## 2016-08-15 NOTE — Progress Notes (Addendum)
Patient here for procedure d/t mid back pain and left hip pain.   Patient states that her medication refill appt is tomorrow and would like to know if we can take care of this while she is here today.   Oxycodone hcl 10 mg qty 21/120 last fill 0914/17.    Safety precautions to be maintained throughout the outpatient stay will include: orient to surroundings, keep bed in low position, maintain call bell within reach at all times, provide assistance with transfer out of bed and ambulation.

## 2016-08-15 NOTE — Patient Instructions (Addendum)
Post-procedure Information What to expect: Most procedures involve the use of a local anesthetic (numbing medicine), and a steroid (anti-inflammatory medicine).  The local anesthetics may cause temporary numbness and weakness of the legs or arms, depending on the location of the block. This numbness/weakness may last 4-6 hours, depending on the local anesthetic used. In rare instances, it can last up to 24 hours. While numb, you must be very careful not to injure the extremity.  After any procedure, you could expect the pain to get better within 15-20 minutes. This relief is temporary and may last 4-6 hours. Once the local anesthetics wears off, you could experience discomfort, possibly more than usual, for up to 10 (ten) days. In the case of radiofrequencies, it may last up to 6 weeks. Surgeries may take up to 8 weeks for the healing process. The discomfort is due to the irritation caused by needles going through skin and muscle. To minimize the discomfort, we recommend using ice the first day, and heat from then on. The ice should be applied for 15 minutes on, and 15 minutes off. Keep repeating this cycle until bedtime. Avoid applying the ice directly to the skin, to prevent frostbite. Heat should be used daily, until the pain improves (4-10 days). Be careful not to burn yourself.  Occasionally you may experience muscle spasms or cramps. These occur as a consequence of the irritation caused by the needle sticks to the muscle and the blood that will inevitably be lost into the surrounding muscle tissue. Blood tends to be very irritating to tissues, which tend to react by going into spasm. These spasms may start the same day of your procedure, but they may also take days to develop. This late onset type of spasm or cramp is usually caused by electrolyte imbalances triggered by the steroids, at the level of the kidney. Cramps and spasms tend to respond well to muscle relaxants, multivitamins (some are  triggered by the procedure, but may have their origins in vitamin deficiencies), and "Gatorade", or any sports drinks that can replenish any electrolyte imbalances. (If you are a diabetic, ask your pharmacist to get you a sugar-free brand.) Warm showers or baths may also be helpful. Stretching exercises are highly recommended.  General Instructions:  Be alert for signs of possible infection: redness, swelling, heat, red streaks, elevated temperature, and/or fever. These typically appear 4 to 6 days after the procedure. Immediately notify your doctor if you experience unusual bleeding, difficulty breathing, or loss of bowel or bladder control. If you experience increased pain, do not increase your pain medicine intake, unless instructed by your pain physician.  Post-Procedure Care:  Be careful in moving about. Muscle spasms in the area of the injection may occur. Applying ice or heat to the area is often helpful. The incidence of spinal headaches after epidural injections ranges between 1.4% and 6%. If you develop a headache that does not seem to respond to conservative therapy, please let your physician know. This can be treated with an epidural blood patch.   Post-procedure numbness or redness is to be expected, however it should average 4 to 6 hours. If numbness and weakness of your extremities begins to develop 4 to 6 hours after your procedure, and is felt to be progressing and worsening, immediately contact your physician.  Diet:  If you experience nausea, do not eat until this sensation goes away. If you had a "Stellate Ganglion Block" for upper extremity "Reflex Sympathetic Dystrophy", do not eat or drink until  your hoarseness goes away. In any case, always start with liquids first and if you tolerate them well, then slowly progress to more solid foods.  Activity:  For the first 4 to 6 hours after the procedure, use caution in moving about as you may experience numbness and/or weakness. Use  caution in cooking, using household electrical appliances, and climbing steps. If you need to reach your Doctor call our office: [phone] (During business hours) or [phone] (After business hours).  Business Hours: Monday-Thursday 8:00 am - 4:00 PM    Fridays: Closed     In case of an emergency: In case of emergency, call 911 or go to the nearest emergency room and have the physician there call us.  Interpretation of Procedure Every nerve block has two components: a diagnostic component, and a treatment component. Unrealistic expectations are the most common causes of "perceived failure".  In a perfect world, a single nerve block should be able to completely and permanently eliminate the pain. Sadly, the world is not perfect.  Most pain management nerve blocks are performed using local anesthetics and steroids. Steroids are responsible for any long-term benefit that you may experience. Their purpose is to decrease any chronic swelling that may exist in the area. Steroids begin to work immediately after being injected. However, most patients will not experience any benefits until 5 to 10 days after the injection, when the swelling has come down to the point where they can tell a difference. Steroids will only help if there is swelling to be treated. As such, they can assist with the diagnosis. If effective, they suggest an inflammatory component to the pain, and if ineffective, they rule out inflammation as the main cause or component of the problem. If the problem is one of mechanical compression, you will get no benefit from those steroids.   In the case of local anesthetics, they have a crucial role in the diagnosis of your condition. Most will begin to work within15 to 20 minutes after injection. The duration will depend on the type used (short- vs. Long-acting). It is of outmost importance that patients keep tract of their pain, after the procedure. To assist with this matter, a  "Post-procedure Pain Diary" is provided. Make sure to complete it and to bring it back to your follow-up appointment.  As long as the patient keeps accurate, detailed records of their symptoms after every procedure, and returns to have those interpreted, every procedure will provide [place] with invaluable information. Even a block that does not provide the patient with any relief, will always provide [place] with information about the mechanism and the origin of the pain. The only time a nerve block can be considered a waste of time is when patients do not keep track of the results, or do not keep their post-procedure appointment.  Reporting the results back to your physician The Pain Score  Pain is a subjective complaint. It cannot be seen, touched, or measured. We depend entirely on the patient's report of the pain in order to assess your condition and treatment. To evaluate the pain, we use a pain scale, where "0" means "No Pain", and a "10" is "the worst possible pain that you can even imagine" (i.e. something like been eaten alive by a shark or being torn apart by a lion).   Use the Pain Scale provided. You will frequently be asked to rate your pain. Please be accurate, remember that medical decisions will be based on your responses. Please  do not rate your pain above a 10. Doing so is actually interpreted as "symptom magnification" (exaggeration). To put this into perspective, when you tell us that your pain is at a 10 (ten), what you are saying is that there is nothing we can do to make this pain any worse. (Carefully think about that.)  Post Procedure Instructions:  The drugs you were given will stay in your system until tomorrow, so for the next 24 hours you should not drive, make any legal decisions or drink any alcoholic beverages.  You may eat anything you prefer, but it is better to start with liquids then soups and crackers, and gradually work up to solid foods.  Please notify your doctor  immediately if you have any unusual bleeding, trouble breathing or pain that is not related to your normal pain.  Depending on the type of procedure that was done, some parts of your body may feel week and/or numb.  This usually clears up by tonight or the next day.  Walk with the use of an assistive device or accompanied by an adult for the 24 hours.  You may use ice on the affected area for the first 24 hours.  Put ice in a Ziploc bag and cover with a towel and place against area 15 minutes on 15 minutes off.  You may switch to heat after 24 hours.

## 2016-08-16 ENCOUNTER — Ambulatory Visit: Payer: Commercial Managed Care - HMO | Attending: Pain Medicine | Admitting: Pain Medicine

## 2016-08-16 ENCOUNTER — Encounter: Payer: Self-pay | Admitting: Pain Medicine

## 2016-08-16 ENCOUNTER — Telehealth: Payer: Self-pay | Admitting: *Deleted

## 2016-08-16 DIAGNOSIS — M48061 Spinal stenosis, lumbar region without neurogenic claudication: Secondary | ICD-10-CM | POA: Insufficient documentation

## 2016-08-16 DIAGNOSIS — Z79891 Long term (current) use of opiate analgesic: Secondary | ICD-10-CM | POA: Diagnosis not present

## 2016-08-16 DIAGNOSIS — M961 Postlaminectomy syndrome, not elsewhere classified: Secondary | ICD-10-CM | POA: Diagnosis not present

## 2016-08-16 DIAGNOSIS — F419 Anxiety disorder, unspecified: Secondary | ICD-10-CM | POA: Insufficient documentation

## 2016-08-16 DIAGNOSIS — E785 Hyperlipidemia, unspecified: Secondary | ICD-10-CM | POA: Insufficient documentation

## 2016-08-16 DIAGNOSIS — E663 Overweight: Secondary | ICD-10-CM | POA: Insufficient documentation

## 2016-08-16 DIAGNOSIS — G894 Chronic pain syndrome: Secondary | ICD-10-CM | POA: Diagnosis not present

## 2016-08-16 DIAGNOSIS — Z6823 Body mass index (BMI) 23.0-23.9, adult: Secondary | ICD-10-CM | POA: Insufficient documentation

## 2016-08-16 DIAGNOSIS — F329 Major depressive disorder, single episode, unspecified: Secondary | ICD-10-CM | POA: Diagnosis not present

## 2016-08-16 DIAGNOSIS — Z5181 Encounter for therapeutic drug level monitoring: Secondary | ICD-10-CM | POA: Insufficient documentation

## 2016-08-16 DIAGNOSIS — M545 Low back pain: Secondary | ICD-10-CM | POA: Insufficient documentation

## 2016-08-16 DIAGNOSIS — Z96653 Presence of artificial knee joint, bilateral: Secondary | ICD-10-CM | POA: Diagnosis not present

## 2016-08-16 DIAGNOSIS — Z79899 Other long term (current) drug therapy: Secondary | ICD-10-CM | POA: Diagnosis not present

## 2016-08-16 DIAGNOSIS — M1610 Unilateral primary osteoarthritis, unspecified hip: Secondary | ICD-10-CM | POA: Diagnosis not present

## 2016-08-16 DIAGNOSIS — Z981 Arthrodesis status: Secondary | ICD-10-CM | POA: Diagnosis not present

## 2016-08-16 MED ORDER — OXYCODONE HCL 10 MG PO TABS: 10.0000 mg | ORAL_TABLET | Freq: Four times a day (QID) | ORAL | 0 refills | Status: DC | PRN

## 2016-08-16 NOTE — Telephone Encounter (Signed)
Spoke with patient re; procedure on yesterday.  Verbalizes no complications.  

## 2016-08-16 NOTE — Progress Notes (Signed)
Safety precautions to be maintained throughout the outpatient stay will include: orient to surroundings, keep bed in low position, maintain call bell within reach at all times, provide assistance with transfer out of bed and ambulation.   Pills remaining:oxycodone 10mg   17/120 filled 07/20/16

## 2016-08-16 NOTE — Patient Instructions (Signed)

## 2016-08-16 NOTE — Progress Notes (Signed)
Patient's Name: Brittany Mays  MRN: [id number]  Referring Provider: [name], MD  DOB: 02/13/1941  PCP: [name], MD  DOS: 08/16/2016  Note by: Kathlen Brunswick. Dossie Arbour, MD  Service setting: Ambulatory outpatient  Specialty: Interventional Pain Management  Location: [facility] (AMB) Pain Management Facility    Patient type: Established   Primary Reason(s) for Visit: Encounter for prescription drug management & post-procedure evaluation of chronic illness with mild to moderate exacerbation(Level of risk: moderate) CC: Back Pain (lower)  HPI  Brittany Mays is a 75 y.o. year old, female patient, who comes today for an initial evaluation. She has Anxiety and depression; Obesity (BMI 30-39.9); Lumbar spinal stenosis; History of lumbar fusion (T10 through L3 fusion); Generalized weakness; Mechanical complication of internal fixation device such as nail, plate or rod ([place]); Pseudarthrosis after fusion or arthrodesis; Solitary pulmonary nodule; Chronic hip pain (Location of Secondary source of pain) (Left); Hyperlipidemia; Aortic aneurysm ([place]); Osteoarthritis, multiple sites; Chronic pain; Chronic low back pain (Location of Primary Source of Pain) (Bilateral) (R>L); Failed back surgical syndrome (x4); Chronic knee pain (Bilateral) (L>R); Long term current use of opiate analgesic; Long term prescription opiate use; Opiate use (60 MME/Day); Encounter for pain management planning; Encounter for therapeutic drug level monitoring; Abnormal CT scan, lumbar spine (05/07/2015); 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 Her primarily concern today is the Back Pain (lower)  Pain Assessment: Self-Reported Pain Score: 0-No pain/10             Reported level is compatible with observation.       Pain Type: Chronic pain Pain Location: Back Pain Orientation: Left Pain Descriptors / Indicators: Nagging, Sharp Pain Frequency: Constant  The patient comes into the clinics today for post-procedure evaluation on the interventional treatment done on 08/15/2016. In addition, she comes in today for pharmacological management of her chronic pain.  The patient  reports that she does not use drugs.  Date of Last Visit: 08/15/16 Service Provided on Last Visit: Procedure  Controlled Substance Pharmacotherapy Assessment & REMS (Risk Evaluation and Mitigation Strategy)  Analgesic:Oxycodone IR 10 mg every 6 hours (40 mg/dayof oxycodone) MME/day:60mg /day Pill Count: Pills remaining:oxycodone 10mg   17/120 filled 07/20/16. Pharmacokinetics: Onset of action (Liberation/Absorption): Within expected pharmacological parameters Time to Peak effect (Distribution): Timing and results are as within normal expected parameters Duration of action (Metabolism/Excretion): Within normal limits for medication Pharmacodynamics: Analgesic Effect: More than 50% Activity Facilitation: Medication(s) allow patient to sit, stand, walk, and do the basic ADLs Perceived Effectiveness: Described as relatively effective, allowing for increase in activities of daily living (ADL) Side-effects or Adverse reactions: None reported Monitoring:  PMP: Online review of the past 69-month period conducted. Compliant with practice rules and regulations List of all UDS test(s) done:  Lab Results  Component Value Date   SUMMARY FINAL 07/12/2016   Last UDS on record: Summary  Date Value Ref Range Status  07/12/2016 FINAL  Final    Comment:     ==================================================================== [facility]RE COMP DRUG ANALYSIS,UR ==================================================================== Test                             Result  Flag       Units Drug Present and Declared for Prescription Verification   Oxycodone                      7500         EXPECTED   ng/mg creat   Oxymorphone                    7340         EXPECTED   ng/mg creat   Noroxycodone                   >11765       EXPECTED   ng/mg creat   Noroxymorphone                 918          EXPECTED   ng/mg creat    Sources of oxycodone are scheduled prescription medications.    Oxymorphone, noroxycodone, and noroxymorphone are expected    metabolites of oxycodone. Oxymorphone is also available as a    scheduled prescription medication.   Gabapentin                     PRESENT      EXPECTED   Duloxetine                     PRESENT      EXPECTED   Acetaminophen                  PRESENT      EXPECTED   Diphenhydramine                PRESENT      EXPECTED ==================================================================== Test                      Result    Flag   Units      Ref Range   Creatinine              85               mg/dL      >=20 ==================================================================== Declared Medications:  The flagging and interpretation on this report are based on the  following declared medications.  Unexpected results may arise from  inaccuracies in the declared medications.  **Note: The testing scope of this panel includes these medications:  Diphenhydramine (Tylenol PM)  Duloxetine (Cymbalta)  Gabapentin (Neurontin)  Oxycodone  **Note: The testing scope of this panel does not include small to  moderate amounts of these reported medications:  Acetaminophen (Tylenol PM)  **Note: The testing scope of this panel does not include following  reported medications:  Chondroitin (Glucosamine-Chondroitin)   Cyanocobalamin  Glucosamine (Glucosamine-Chondroitin)  Melatonin  Multivitamin (MVI)  Sennosides (Senna-Lax)  Supplement (Red Yeast Rice)  Vitamin C ==================================================================== For clinical consultation, please call [phone]. ====================================================================    UDS interpretation: Compliant          Medication Assessment Form: Reviewed. Patient indicates being compliant with therapy Treatment compliance: Compliant Risk Assessment: Aberrant Behavior: None observed today Substance Use Disorder (SUD) Risk Level: Low Risk of opioid abuse or dependence: 0.7-3.0% with doses ? 36 MME/day and 6.1-26% with doses ? 120 MME/day. Opioid Risk Tool (ORT) Score: 1 Low Risk for SUD (Score <3) Depression Scale Score: PHQ-2: 0 No depression (0) PHQ-9: 0 No depression (0-4) Risk Mitigation Strategies:  Patient Counseling:  Completed Patient-Prescriber Agreement (PPA): Present and active  Notification to other healthcare providers: Done   Pharmacologic Plan: No change in therapy, at this time  Post-Procedure Assessment  Procedure done on 08/15/2016: Diagnostic bilateral L1, L2, and L3 medial branch block under fluoroscopic guidance and IV sedation. Complications experienced at the time of the procedure: None Side-effects or Adverse reactions: None reported Sedation: Sedation provided. When no sedatives are used, the analgesic levels obtained are directly associated with the effectiveness of the local anesthetics. On the other hand, when sedation is provided, the level of analgesia obtained during the initial 1 hour, immediately following the intervention, is believed to be the result of a combination of factors. These factors may include, but are not limited to: 1. The effectiveness of the local anesthetics used. 2. The effects of the analgesic(s) and/or anxiolytic(s) used. 3. The degree of discomfort experienced by  the patient at the time of the procedure. 4. The patients ability and reliability in recalling and recording the events. 5. The presence and influence of possible secondary gains. Results: Relief during the 1st hour after the procedure: 90 % (Ultra-Short Term Relief) Interpretative note: Analgesia during this period is likely to be Local Anesthetic and/or IV Sedative (Analgesic/Anxiolitic) related Local Anesthesia: Long-acting (4-6 hours) anesthetics used. The analgesic levels attained during this period are directly associated to the localized infiltration of local anesthetics and therefore cary significant diagnostic value as to the etiological location or origin of the pain. Results: Relief during the next 4 to 6 hour after the procedure: 100 % (Short Term Relief) Interpretative note: Complete relief confirms area to be the source of pain  Laboratory Chemistry  Inflammation Markers Lab Results  Component Value Date   ESRSEDRATE 6 07/12/2016   CRP 0.6 07/12/2016   Renal Function Lab Results  Component Value Date   BUN 14 07/12/2016   CREATININE 0.70 07/12/2016   GFRAA >60 07/12/2016   GFRNONAA >60 07/12/2016   Hepatic Function Lab Results  Component Value Date   AST 25 07/12/2016   ALT 18 07/12/2016   ALBUMIN 4.3 07/12/2016   Electrolytes Lab Results  Component Value Date   NA 140 07/12/2016   K 4.1 07/12/2016   CL 102 07/12/2016   CALCIUM 9.1 07/12/2016   MG 2.0 07/12/2016   Pain Modulating Vitamins Lab Results  Component Value Date   25OHVITD1 35 07/12/2016   25OHVITD2 <1.0 07/12/2016   25OHVITD3 35 07/12/2016   VITAMINB12 1,782 (H) 07/12/2016   Coagulation Parameters Lab Results  Component Value Date   PLT 168.0 09/07/2015   Cardiovascular Lab Results  Component Value Date   HGB 14.4 09/07/2015   HCT 44.0 09/07/2015   Note: Lab results reviewed.  Recent Diagnostic Imaging  Dg C-arm 1-60 Min-no Report  Result Date: 08/15/2016 CLINICAL DATA:  Assistance in needle guidance and placement for procedures requiring needle placement in or near specific anatomical locations not easily accessible without such assistance. C-ARM 1-60 MINUTES Fluoroscopy was utilized by the requesting physician.  No radiographic interpretation.   Meds  The patient has a current medication list which includes the following prescription(s): vitamin c, diphenhydramine-acetaminophen, duloxetine, fish oil-omega-3 fatty acids, fluzone high-dose, gabapentin, glucosamine-chondroitin, lubiprostone, melatonin, womens multi, naloxone, oxycodone hcl, oxycodone hcl, red yeast rice, senna laxative, vitamin b-12, benefiber, and oxycodone hcl.  Current Outpatient Prescriptions on File Prior to Visit  Medication Sig  . Ascorbic Acid (VITAMIN C) 500 MG CAPS Take by mouth daily.  . diphenhydramine-acetaminophen (TYLENOL PM) 25-500 MG TABS tablet  Take 2 tablets by mouth at bedtime as needed.  . DULoxetine (CYMBALTA) 60 MG capsule Take 1 capsule (60 mg total) by mouth daily.  . fish oil-omega-3 fatty acids 1000 MG capsule Take 1 g by mouth daily.   Marland Kitchen FLUZONE HIGH-DOSE 0.5 ML SUSY   . gabapentin (NEURONTIN) 600 MG tablet 2 (two) times daily. 300 mg in a.m., 600 mg at night  . glucosamine-chondroitin 500-400 MG tablet Take 2 tablets by mouth daily.  Marland Kitchen lubiprostone (AMITIZA) 8 MCG capsule Take 1 capsule (8 mcg total) by mouth 2 (two) times daily with a meal. Swallow the medication whole. Do not break or chew the medication.  . Melatonin 3 MG CAPS Take 1 capsule (3 mg total) by mouth at bedtime as needed (insomnia). (Patient taking differently: Take 5 mg by mouth at bedtime as needed (insomnia). )  . Multiple Vitamins-Minerals (WOMENS MULTI) CAPS Take one tablet once daily  . naloxone [facility]) 2 MG/2ML injection Inject content of syringe into thigh muscle. Call 911.  . Red Yeast Rice 600 MG CAPS Take 2 capsules (1,200 mg total) by mouth daily.  . SENNA LAXATIVE 25 MG TABS as needed.   .  vitamin B-12 (CYANOCOBALAMIN) 100 MCG tablet Take 1,000 mcg by mouth daily.   . Wheat Dextrin (BENEFIBER) POWD Stir 2 tsp. TID into 4-8 oz of any non-carbonated beverage or soft food (hot or cold)   No current facility-administered medications on file prior to visit.    ROS  Constitutional: Denies any fever or chills Gastrointestinal: No reported hemesis, hematochezia, vomiting, or acute GI distress Musculoskeletal: Denies any acute onset joint swelling, redness, loss of ROM, or weakness Neurological: No reported episodes of acute onset apraxia, aphasia, dysarthria, agnosia, amnesia, paralysis, loss of coordination, or loss of consciousness  Allergies  Ms. Tissot is allergic to nsaids; pentothal [thiopental]; and fentanyl.  [place]  Medical:  Ms. Remsen  has a past medical history of Anxiety; Aortic aneurysm ([place]) (06/06/2016); Cataract; Chest pain (08/18/2015); Decreased muscle strength (02/10/2014); Depression; Difficulty in walking(719.7) (02/10/2014); Dysphagia (08/19/2015); Dyspnea (08/19/2015); Hyperparathyroidism, primary ([place]); Hyperventilation (08/18/2015); Insomnia; Intractable back pain (04/29/2014); Kidney stone; Lumbar spinal stenosis; Odynophagia (08/18/2015); Osteoarthritis of both knees; Pain; Rectal prolapse; Unintentional weight loss (09/08/2015); and UTI (lower urinary tract infection) (04/28/2014). Family: family history includes COPD in her father; Diabetes in her son; Heart failure in her father; Mental illness in her mother. Surgical:  has a past surgical history that includes Abdominal hysterectomy; Knee surgery (Bilateral, 1990's nd 2002); Spine surgery; Parathyroidectomy [id number]); colonoscopy (2013); Appendectomy; Breast lumpectomy; Tonsillectomy; and Back surgery (07-2015). Tobacco:  reports that she has been smoking Cigarettes.  She has a 25.00 pack-year smoking history. She has never used smokeless tobacco. Alcohol:  reports that she does not drink alcohol. Drug:  reports that  she does not use drugs.  Constitutional Exam  General appearance: Well nourished, well developed, and well hydrated. In no acute distress Vitals:   08/16/16 1439 08/16/16 1441  BP: (!) 119/46 120/65  Pulse: 81   Resp: 16   Temp: 98 F (36.7 C)   SpO2: 100%   Weight: 126 lb (57.2 kg)   Height: 5\' 1"  (1.549 m)   BMI Assessment: Estimated body mass index is 23.81 kg/m as calculated from the following:   Height as of this encounter: 5\' 1"  (1.549 m).   Weight as of this encounter: 126 lb (57.2 kg).   BMI interpretation: (25-29.9 kg/m2) = Overweight: This range is associated with a 20% higher incidence  of chronic pain. BMI Readings from Last 4 Encounters:  08/16/16 23.81 kg/m  08/15/16 25.00 kg/m  07/19/16 24.61 kg/m  07/12/16 24.61 kg/m   Wt Readings from Last 4 Encounters:  08/16/16 126 lb (57.2 kg)  08/15/16 128 lb (58.1 kg)  07/19/16 126 lb (57.2 kg)  07/12/16 126 lb (57.2 kg)  Psych/Mental status: Alert and oriented x 3 (person, place, & time) Eyes: PERLA Respiratory: No evidence of acute respiratory distress  Cervical Spine Exam  Inspection: No masses, redness, or swelling Alignment: Symmetrical Functional ROM: Unrestricted ROM Stability: No instability detected Muscle strength & Tone: Functionally intact Sensory: Unimpaired Palpation: Non-contributory  Upper Extremity (UE) Exam    Side: Right upper extremity  Side: Left upper extremity  Inspection: No masses, redness, swelling, or asymmetry  Inspection: No masses, redness, swelling, or asymmetry  Functional ROM: Unrestricted ROM         Functional ROM: Unrestricted ROM          Muscle strength & Tone: Functionally intact  Muscle strength & Tone: Functionally intact  Sensory: Unimpaired  Sensory: Unimpaired  Palpation: Non-contributory  Palpation: Non-contributory   Thoracic Spine Exam  Inspection: No masses, redness, or swelling Alignment: Symmetrical Functional ROM: Unrestricted ROM Stability: No  instability detected Sensory: Unimpaired Muscle strength & Tone: Functionally intact Palpation: Non-contributory  Lumbar Spine Exam  Inspection: No masses, redness, or swelling Alignment: Symmetrical Functional ROM: Unrestricted ROM Stability: No instability detected Muscle strength & Tone: Functionally intact Sensory: Unimpaired Palpation: Non-contributory Provocative Tests: Lumbar Hyperextension and rotation test: evaluation deferred today       Patrick's Maneuver: evaluation deferred today              Gait & Posture Assessment  Ambulation: Unassisted Gait: Relatively normal for age and body habitus Posture: WNL   Lower Extremity Exam    Side: Right lower extremity  Side: Left lower extremity  Inspection: No masses, redness, swelling, or asymmetry  Inspection: No masses, redness, swelling, or asymmetry  Functional ROM: Unrestricted ROM          Functional ROM: Unrestricted ROM          Muscle strength & Tone: Functionally intact  Muscle strength & Tone: Functionally intact  Sensory: Unimpaired  Sensory: Unimpaired  Palpation: Non-contributory  Palpation: Non-contributory   Assessment  Primary Diagnosis & Pertinent Problem List: The encounter diagnosis was Chronic pain syndrome.  Visit Diagnosis: 1. Chronic pain syndrome    Plan of Care  Pharmacotherapy (Medications Ordered): Meds ordered this encounter  Medications  . Oxycodone HCl 10 MG TABS    Sig: Take 1 tablet (10 mg total) by mouth every 6 (six) hours as needed.    Dispense:  120 tablet    Refill:  0    Do not place this medication, or any other prescription from our practice, on "Automatic Refill". Patient may have prescription filled one day early if pharmacy is closed on scheduled refill date. Do not fill until: 08/18/16 To last until: 09/17/16  . Oxycodone HCl 10 MG TABS    Sig: Take 1 tablet (10 mg total) by mouth every 6 (six) hours as needed.    Dispense:  120 tablet    Refill:  0    Do not place  this medication, or any other prescription from our practice, on "Automatic Refill". Patient may have prescription filled one day early if pharmacy is closed on scheduled refill date. Do not fill until: 09/17/16 To last until: 10/17/16  . Oxycodone HCl  10 MG TABS    Sig: Take 1 tablet (10 mg total) by mouth every 6 (six) hours as needed.    Dispense:  120 tablet    Refill:  0    Do not place this medication, or any other prescription from our practice, on "Automatic Refill". Patient may have prescription filled one day early if pharmacy is closed on scheduled refill date. Do not fill until: 10/17/16 To last until: 11/16/16   New Prescriptions   No medications on file   Medications administered during this visit: Ms. Speese had no medications administered during this visit. Lab-work, Procedure(s), & Referral(s) Ordered: No orders of the defined types were placed in this encounter.  Imaging & Referral(s) Ordered: None  Interventional Therapies: Scheduled: Diagnostic bilateral lumbar facet block #2 under fluoro and sedation.   Considering: Diagnostic bilateral thoracolumbar facet block. Possible bilateral thoracolumbar facet radiofrequency ablation.  Diagnostic left intra-articular hip joint injection. Diagnostic left femoral and obturator nerve articular branch blocks.  Possible left femoral and obturator nerve articular branch radiofrequency ablation.  Diagnostic bilateral genicular nerve blocks.  Possible bilateral genicular nerve radiofrequency ablation.   Diagnostic intrathecal pump trial.    PRN Procedures:None at this time.    Requested PM Follow-up: Return in about 3 months (around 11/14/2016) for Med-Mgmt, Keep prior appointment for postprocedure evaluation..  Future Appointments Date Time Provider [place]  09/06/2016 10:45 AM [name], MD [facility] None  09/13/2016 1:00 PM Milinda Pointer, MD [facility] None  11/07/2016 10:30 AM Milinda Pointer, MD [facility] None    Primary Care Physician: [name], MD Location: [facility] Outpatient Pain Management Facility Note by: Ayoub Arey A. Dossie Arbour, M.D, DABA, DABAPM, DABPM, DABIPP, FIPP  Pain Score Disclaimer: We use the NRS-11 scale. This is a self-reported, subjective measurement of pain severity with only modest accuracy. It is used primarily to identify changes within a particular patient. It must be understood that outpatient pain scales are significantly less accurate that those used for research, where they can be applied under ideal controlled circumstances with minimal exposure to variables. In reality, the score is likely to be a combination of pain intensity and pain affect, where pain affect describes the degree of emotional arousal or changes in action readiness caused by the sensory experience of pain. Factors such as social and work situation, setting, emotional state, anxiety levels, expectation, and prior pain experience may influence pain perception and show large inter-individual differences that may also be affected by time variables.  Patient instructions provided during this appointment: Patient Instructions  Pain Management Discharge Instructions  General Discharge Instructions :  If you need to reach your doctor call: Monday-Friday 8:00 am - 4:00 pm at [phone] or toll free [phone].  After clinic hours [phone] to have operator reach doctor.  Bring all of your medication bottles to all your appointments in the pain clinic.  To cancel or reschedule your appointment with Pain Management please remember to call 24 hours in advance to avoid a fee.  Refer to the educational materials which you have been given on: General Risks, I had my Procedure. Discharge Instructions, Post Sedation.  Post Procedure Instructions:  The drugs you were given will stay in your system until tomorrow, so for the next 24 hours you should not drive, make any legal  decisions or drink any alcoholic beverages.  You may eat anything you prefer, but it is better to start with liquids then soups and crackers, and gradually work up to solid foods.  Please  notify your doctor immediately if you have any unusual bleeding, trouble breathing or pain that is not related to your normal pain.  Depending on the type of procedure that was done, some parts of your body may feel week and/or numb.  This usually clears up by tonight or the next day.  Walk with the use of an assistive device or accompanied by an adult for the 24 hours.  You may use ice on the affected area for the first 24 hours.  Put ice in a Ziploc bag and cover with a towel and place against area 15 minutes on 15 minutes off.  You may switch to heat after 24 hours.

## 2016-08-23 ENCOUNTER — Encounter: Payer: Commercial Managed Care - HMO | Admitting: Pain Medicine

## 2016-09-06 ENCOUNTER — Encounter: Payer: Self-pay | Admitting: Family Medicine

## 2016-09-06 ENCOUNTER — Ambulatory Visit (INDEPENDENT_AMBULATORY_CARE_PROVIDER_SITE_OTHER): Payer: Commercial Managed Care - HMO | Admitting: Family Medicine

## 2016-09-06 ENCOUNTER — Ambulatory Visit: Payer: Commercial Managed Care - HMO | Admitting: Family Medicine

## 2016-09-06 DIAGNOSIS — F418 Other specified anxiety disorders: Secondary | ICD-10-CM | POA: Diagnosis not present

## 2016-09-06 DIAGNOSIS — G8929 Other chronic pain: Secondary | ICD-10-CM

## 2016-09-06 DIAGNOSIS — M545 Low back pain: Secondary | ICD-10-CM

## 2016-09-06 DIAGNOSIS — F329 Major depressive disorder, single episode, unspecified: Secondary | ICD-10-CM

## 2016-09-06 DIAGNOSIS — E785 Hyperlipidemia, unspecified: Secondary | ICD-10-CM

## 2016-09-06 DIAGNOSIS — F419 Anxiety disorder, unspecified: Principal | ICD-10-CM

## 2016-09-06 NOTE — Progress Notes (Signed)
  [name], MD Phone: [phone]  Brittany Mays is a 75 y.o. female who presents today for follow-up.  Anxiety/depression: She notes this is quite a bit improved. No depression at this time. Some anxiety. Notes there is nothing in particular that makes her anxious. She is currently on Cymbalta.  Hypertriglyceridemia: Taking red yeast rice and fish oil. Has worked on diet significantly. Cut out her sweets. More vegetables. Is also exercising by walking 3-4 days a week. No chest pain or shortness of breath.  Chronic back pain: Patient has seen the pain clinic. They did injections in her back which helped significantly. Notes very little pain. Taking oxycodone. No numbness, weakness, loss of bowel or bladder function, saddle anesthesia, or fevers. She has follow-up with the pain clinic in one week.  PMH: Smoker. Not interested in quitting.   ROS see history of present illness  Objective  Physical Exam Vitals:   09/06/16 1036  BP: 104/68  Pulse: 81  Temp: 98.1 F (36.7 C)    BP Readings from Last 3 Encounters:  09/06/16 104/68  08/16/16 120/65  08/15/16 124/65   Wt Readings from Last 3 Encounters:  09/06/16 129 lb (58.5 kg)  08/16/16 126 lb (57.2 kg)  08/15/16 128 lb (58.1 kg)    Physical Exam  Constitutional: No distress.  Cardiovascular: Normal rate, regular rhythm and normal heart sounds.   Pulmonary/Chest: Effort normal and breath sounds normal.  Musculoskeletal: She exhibits no edema.  No midline spine tenderness, no midline spine step-off, no muscular back tenderness, there is a midline scar from the thoracic region to the lumbar region  Neurological: She is alert. Gait normal.  5 out of 5 strength bilateral quads, hamstrings, plantar flexion, and dorsiflexion, sensation to light touch intact in bilateral lower extremities  Skin: Skin is warm and dry. She is not diaphoretic.  Psychiatric:  Mood mildly anxious, affect mildly anxious     Assessment/Plan:  Please see individual problem list.  Anxiety and depression Improved. Continue Cymbalta. Continue to monitor.  Hyperlipidemia Continue fish oil and red yeast rice. We will recheck her triglycerides at her next office visit.  Chronic low back pain (Location of Primary Source of Pain) (Bilateral) (R>L) Patient has had a great improvement in her discomfort with recent injections. Very little pain at this time. She'll continue to follow with pain management to consider further injections versus other management.   [name], MD [place]

## 2016-09-06 NOTE — Assessment & Plan Note (Signed)
Continue fish oil and red yeast rice. We will recheck her triglycerides at her next office visit.

## 2016-09-06 NOTE — Assessment & Plan Note (Signed)
Patient has had a great improvement in her discomfort with recent injections. Very little pain at this time. She'll continue to follow with pain management to consider further injections versus other management.

## 2016-09-06 NOTE — Progress Notes (Signed)
Pre visit review using our clinic review tool, if applicable. No additional management support is needed unless otherwise documented below in the visit note. 

## 2016-09-06 NOTE — Assessment & Plan Note (Signed)
Improved. Continue Cymbalta. Continue to monitor.

## 2016-09-06 NOTE — Patient Instructions (Signed)
Nice to see you. Please continue to work on diet and exercise. Please continue your Cymbalta.  Please continue to follow with the pain clinic. If you develop worsening pain, numbness, weakness, loss of bowel or bladder function, numbness between your legs, or any new or changing symptoms please seek medical attention immediately.

## 2016-09-13 ENCOUNTER — Encounter: Payer: Self-pay | Admitting: Pain Medicine

## 2016-09-13 ENCOUNTER — Ambulatory Visit: Payer: Commercial Managed Care - HMO | Attending: Pain Medicine | Admitting: Pain Medicine

## 2016-09-13 VITALS — BP 101/70 | HR 79 | Temp 98.3°F | Resp 16 | Ht 61.0 in | Wt 128.0 lb

## 2016-09-13 DIAGNOSIS — Z981 Arthrodesis status: Secondary | ICD-10-CM | POA: Insufficient documentation

## 2016-09-13 DIAGNOSIS — M170 Bilateral primary osteoarthritis of knee: Secondary | ICD-10-CM | POA: Diagnosis not present

## 2016-09-13 DIAGNOSIS — Z79899 Other long term (current) drug therapy: Secondary | ICD-10-CM | POA: Diagnosis not present

## 2016-09-13 DIAGNOSIS — M1288 Other specific arthropathies, not elsewhere classified, other specified site: Secondary | ICD-10-CM | POA: Insufficient documentation

## 2016-09-13 DIAGNOSIS — M47816 Spondylosis without myelopathy or radiculopathy, lumbar region: Secondary | ICD-10-CM

## 2016-09-13 DIAGNOSIS — M961 Postlaminectomy syndrome, not elsewhere classified: Secondary | ICD-10-CM | POA: Insufficient documentation

## 2016-09-13 DIAGNOSIS — F329 Major depressive disorder, single episode, unspecified: Secondary | ICD-10-CM | POA: Diagnosis not present

## 2016-09-13 DIAGNOSIS — Z888 Allergy status to other drugs, medicaments and biological substances status: Secondary | ICD-10-CM | POA: Diagnosis not present

## 2016-09-13 DIAGNOSIS — M25552 Pain in left hip: Secondary | ICD-10-CM | POA: Diagnosis not present

## 2016-09-13 DIAGNOSIS — Z79891 Long term (current) use of opiate analgesic: Secondary | ICD-10-CM | POA: Insufficient documentation

## 2016-09-13 DIAGNOSIS — M160 Bilateral primary osteoarthritis of hip: Secondary | ICD-10-CM | POA: Insufficient documentation

## 2016-09-13 DIAGNOSIS — G8929 Other chronic pain: Secondary | ICD-10-CM | POA: Diagnosis not present

## 2016-09-13 DIAGNOSIS — E785 Hyperlipidemia, unspecified: Secondary | ICD-10-CM | POA: Diagnosis not present

## 2016-09-13 DIAGNOSIS — M549 Dorsalgia, unspecified: Secondary | ICD-10-CM

## 2016-09-13 DIAGNOSIS — Z825 Family history of asthma and other chronic lower respiratory diseases: Secondary | ICD-10-CM | POA: Diagnosis not present

## 2016-09-13 DIAGNOSIS — M546 Pain in thoracic spine: Secondary | ICD-10-CM | POA: Insufficient documentation

## 2016-09-13 DIAGNOSIS — Z8249 Family history of ischemic heart disease and other diseases of the circulatory system: Secondary | ICD-10-CM | POA: Insufficient documentation

## 2016-09-13 DIAGNOSIS — M48061 Spinal stenosis, lumbar region without neurogenic claudication: Secondary | ICD-10-CM | POA: Insufficient documentation

## 2016-09-13 DIAGNOSIS — F17210 Nicotine dependence, cigarettes, uncomplicated: Secondary | ICD-10-CM | POA: Diagnosis not present

## 2016-09-13 DIAGNOSIS — Z833 Family history of diabetes mellitus: Secondary | ICD-10-CM | POA: Insufficient documentation

## 2016-09-13 DIAGNOSIS — Z885 Allergy status to narcotic agent status: Secondary | ICD-10-CM | POA: Diagnosis not present

## 2016-09-13 NOTE — Progress Notes (Signed)
Patient's Name: Brittany Mays  MRN: [id number]  Referring Provider: [name], MD  DOB: 1941/01/30  PCP: [name], MD  DOS: 09/13/2016  Note by: Kathlen Brunswick. Dossie Arbour, MD  Service setting: Ambulatory outpatient  Specialty: Interventional Pain Management  Location: [facility] (AMB) Pain Management Facility    Patient type: Established   Primary Reason(s) for Visit: Encounter for post-procedure evaluation of chronic illness with mild to moderate exacerbation CC: Back Pain (middle of the back) and Hip Pain (left)  HPI  Brittany Mays is a 75 y.o. year old, female patient, who comes today for a post-procedure evaluation. She has Anxiety and depression; Obesity (BMI 30-39.9); Lumbar spinal stenosis; History of lumbar fusion (T10 through L3 fusion); Generalized weakness; Mechanical complication of internal fixation device such as nail, plate or rod ([place]); Pseudarthrosis after fusion or arthrodesis; Solitary pulmonary nodule; Chronic hip pain (Location of Secondary source of pain) (Left); Hyperlipidemia; Aortic aneurysm ([place]); Osteoarthritis, multiple sites; Chronic pain; Chronic low back pain (Location of Primary Source of Pain) (Bilateral) (R>L); Failed back surgical syndrome (x4); Chronic knee pain (Bilateral) (L>R); Long term current use of opiate analgesic; Long term prescription opiate use; Opiate use (60 MME/Day); Encounter for pain management planning; Encounter for therapeutic drug level monitoring; Abnormal CT scan, lumbar spine (05/07/2015); Osteoarthritis of hip  (Bilateral) (L>R); Osteoarthritis of sacroiliac joint (Bilateral); Lumbar facet arthropathy; Lumbar spondylolisthesis; Lumbar foraminal stenosis (Bilateral); History of total bilateral knee replacement; Opioid-induced constipation ([facility]); Chronic groin pain (Location of Tertiary source of pain) (Left); Postlaminectomy syndrome of lumbar region; Lumbar facet syndrome (Bilateral) (R>L); Depression; Pseudarthrosis following spinal fusion;  Lumbar spondylosis; and Chronic upper back pain (midline) on her problem list. Her primarily concern today is the Back Pain (middle of the back) and Hip Pain (left)  Pain Assessment: Self-Reported Pain Score: 0-No pain (no pain where procedure was done; the middle of back is 3-4/10)/10             Reported level is compatible with observation.       Pain Type: Chronic pain Pain Location: Back (no pain from procedure; 3-4/10 middle of the back  and left hip pain) Pain Orientation: Mid Pain Descriptors / Indicators: Aching, Sharp, Dull Pain Frequency: Intermittent  Brittany Mays comes in today for post-procedure evaluation after the treatment done on 08/16/2016.  Further details on both, my assessment(s), as well as the proposed treatment plan, please see below.  Post-Procedure Assessment  08/16/2016 Procedure: Diagnostic bilateral L1, L2, and L3 medial branch block under fluoroscopic guidance and IV sedation. Influential Factors: BMI: 24.19 kg/m Intra-procedural challenges: None observed Assessment challenges: None detected         Post-procedural side-effects, adverse reactions, or complications: None reported Reported issues: None  Sedation: Sedation provided. When no sedatives are used, the analgesic levels obtained are directly associated to the effectiveness of the local anesthetics. However, when sedation is provided, the level of analgesia obtained during the initial 1 hour following the intervention, is believed to be the result of a combination of factors. These factors may include, but are not limited to: 1. The effectiveness of the local anesthetics used. 2. The effects of the analgesic(s) and/or anxiolytic(s) used. 3. The degree of discomfort experienced by the patient at the time of the procedure. 4. The patients ability and reliability in recalling and recording the events. 5. The presence and influence of possible secondary gains and/or psychosocial factors. Reported  result: Relief experienced during the 1st hour after the procedure: 90 % (Ultra-Short Term  Relief) Interpretative annotation: Analgesia during this period is likely to be Local Anesthetic and/or IV Sedative (Analgesic/Anxiolitic) related.          Effects of local anesthetic: The analgesic effects attained during this period are directly associated to the localized infiltration of local anesthetics and therefore cary significant diagnostic value as to the etiological location, or anatomical origin, of the pain. Expected duration of relief is directly dependent on the pharmacodynamics of the local anesthetic used. Long-acting (4-6 hours) anesthetics used.  Reported result: Relief during the next 4 to 6 hour after the procedure: 100 % (Short-Term Relief) Interpretative annotation: Complete relief would suggest area to be the source of the pain.          Long-term benefit: Defined as the period of time past the expected duration of local anesthetics. With the possible exception of prolonged sympathetic blockade from the local anesthetics, benefits during this period are typically attributed to, or associated with, other factors such as analgesic sensory neuropraxia, antiinflammatory effects, or beneficial biochemical changes provided by agents other than the local anesthetics Reported result: Extended relief following procedure: 100 % (Long-Term Relief) Interpretative annotation: Good relief. This could suggest inflammation to be a significant component in the etiology to the pain.          Current benefits: Defined as persistent relief that continues at this point in time.   Reported results: Treated area: 100 %       Interpretative annotation: Ongoing benefits would suggest effective therapeutic approach  Interpretation: Results would suggest a successful diagnostic intervention.          Laboratory Chemistry  Inflammation Markers Lab Results  Component Value Date   ESRSEDRATE 6 07/12/2016   CRP  0.6 07/12/2016   Renal Function Lab Results  Component Value Date   BUN 14 07/12/2016   CREATININE 0.70 07/12/2016   GFRAA >60 07/12/2016   GFRNONAA >60 07/12/2016   Hepatic Function Lab Results  Component Value Date   AST 25 07/12/2016   ALT 18 07/12/2016   ALBUMIN 4.3 07/12/2016   Electrolytes Lab Results  Component Value Date   NA 140 07/12/2016   K 4.1 07/12/2016   CL 102 07/12/2016   CALCIUM 9.1 07/12/2016   MG 2.0 07/12/2016   Pain Modulating Vitamins Lab Results  Component Value Date   25OHVITD1 35 07/12/2016   25OHVITD2 <1.0 07/12/2016   25OHVITD3 35 07/12/2016   VITAMINB12 1,782 (H) 07/12/2016   Coagulation Parameters Lab Results  Component Value Date   PLT 168.0 09/07/2015   Cardiovascular Lab Results  Component Value Date   HGB 14.4 09/07/2015   HCT 44.0 09/07/2015   Note: Lab results reviewed.  Recent Diagnostic Imaging Review  Dg C-arm 1-60 Min-no Report  Result Date: 08/15/2016 CLINICAL DATA: Assistance in needle guidance and placement for procedures requiring needle placement in or near specific anatomical locations not easily accessible without such assistance. C-ARM 1-60 MINUTES Fluoroscopy was utilized by the requesting physician.  No radiographic interpretation.   Note: Imaging results reviewed.  Meds  The patient has a current medication list which includes the following prescription(s): vitamin c, diphenhydramine-acetaminophen, duloxetine, fish oil-omega-3 fatty acids, fluzone high-dose, gabapentin, glucosamine-chondroitin, lubiprostone, melatonin, womens multi, naloxone, oxycodone hcl, oxycodone hcl, oxycodone hcl, red yeast rice, senna laxative, vitamin b-12, and benefiber.  Current Outpatient Prescriptions on File Prior to Visit  Medication Sig  . Ascorbic Acid (VITAMIN C) 500 MG CAPS Take by mouth daily.  . diphenhydramine-acetaminophen (TYLENOL PM) 25-500 MG TABS  tablet Take 2 tablets by mouth at bedtime as needed.  . DULoxetine  (CYMBALTA) 60 MG capsule Take 1 capsule (60 mg total) by mouth daily.  . fish oil-omega-3 fatty acids 1000 MG capsule Take 1 g by mouth daily.   Marland Kitchen FLUZONE HIGH-DOSE 0.5 ML SUSY   . gabapentin (NEURONTIN) 600 MG tablet 2 (two) times daily. 300 mg in a.m., 600 mg at night  . glucosamine-chondroitin 500-400 MG tablet Take 2 tablets by mouth daily.  Marland Kitchen lubiprostone (AMITIZA) 8 MCG capsule Take 1 capsule (8 mcg total) by mouth 2 (two) times daily with a meal. Swallow the medication whole. Do not break or chew the medication.  . Melatonin 3 MG CAPS Take 1 capsule (3 mg total) by mouth at bedtime as needed (insomnia). (Patient taking differently: Take 5 mg by mouth at bedtime as needed (insomnia). )  . Multiple Vitamins-Minerals (WOMENS MULTI) CAPS Take one tablet once daily  . naloxone [facility]) 2 MG/2ML injection Inject content of syringe into thigh muscle. Call 911.  Marland Kitchen Oxycodone HCl 10 MG TABS Take 1 tablet (10 mg total) by mouth every 6 (six) hours as needed.  Derrill Memo ON 09/17/2016] Oxycodone HCl 10 MG TABS Take 1 tablet (10 mg total) by mouth every 6 (six) hours as needed.  Derrill Memo ON 10/17/2016] Oxycodone HCl 10 MG TABS Take 1 tablet (10 mg total) by mouth every 6 (six) hours as needed.  . Red Yeast Rice 600 MG CAPS Take 2 capsules (1,200 mg total) by mouth daily.  . SENNA LAXATIVE 25 MG TABS as needed.   . vitamin B-12 (CYANOCOBALAMIN) 100 MCG tablet Take 1,000 mcg by mouth daily.   . Wheat Dextrin (BENEFIBER) POWD Stir 2 tsp. TID into 4-8 oz of any non-carbonated beverage or soft food (hot or cold)   No current facility-administered medications on file prior to visit.    ROS  Constitutional: Denies any fever or chills Gastrointestinal: No reported hemesis, hematochezia, vomiting, or acute GI distress Musculoskeletal: Denies any acute onset joint swelling, redness, loss of ROM, or weakness Neurological: No reported episodes of acute onset apraxia, aphasia, dysarthria, agnosia, amnesia,  paralysis, loss of coordination, or loss of consciousness  Allergies  Ms. Eid is allergic to nsaids; pentothal [thiopental]; and fentanyl.  PFSH  Drug: Ms. Tonini  reports that she does not use drugs. Alcohol:  reports that she does not drink alcohol. Tobacco:  reports that she has been smoking Cigarettes.  She has a 25.00 pack-year smoking history. She has never used smokeless tobacco. Medical:  has a past medical history of Anxiety; Aortic aneurysm ([place]) (06/06/2016); Cataract; Chest pain (08/18/2015); Decreased muscle strength (02/10/2014); Depression; Difficulty in walking(719.7) (02/10/2014); Dysphagia (08/19/2015); Dyspnea (08/19/2015); Hyperparathyroidism, primary ([place]); Hyperventilation (08/18/2015); Insomnia; Intractable back pain (04/29/2014); Kidney stone; Lumbar spinal stenosis; Odynophagia (08/18/2015); Osteoarthritis of both knees; Pain; Rectal prolapse; Unintentional weight loss (09/08/2015); and UTI (lower urinary tract infection) (04/28/2014). Family: family history includes COPD in her father; Diabetes in her son; Heart failure in her father; Mental illness in her mother.  Past Surgical History:  Procedure Laterality Date  . ABDOMINAL HYSTERECTOMY    . APPENDECTOMY    . BACK SURGERY  07-2015   major surgery  . BREAST LUMPECTOMY     Reports this is benign  . colonoscopy  2013  . KNEE SURGERY Bilateral 1990's nd 2002   Knee Replacements  . PARATHYROIDECTOMY  1990's  . SPINE SURGERY    . TONSILLECTOMY     Constitutional Exam  General appearance: Well nourished, well developed, and well hydrated. In no apparent acute distress Vitals:   09/13/16 1322  BP: 101/70  Pulse: 79  Resp: 16  Temp: 98.3 F (36.8 C)  TempSrc: Oral  SpO2: 100%  Weight: 128 lb (58.1 kg)  Height: 5\' 1"  (1.549 m)   BMI Assessment: Estimated body mass index is 24.19 kg/m as calculated from the following:   Height as of this encounter: 5\' 1"  (1.549 m).   Weight as of this encounter: 128 lb (58.1  kg).  BMI interpretation table: BMI level Category Range association with higher incidence of chronic pain  <18 kg/m2 Underweight   18.5-24.9 kg/m2 Ideal body weight   25-29.9 kg/m2 Overweight Increased incidence by 20%  30-34.9 kg/m2 Obese (Class I) Increased incidence by 68%  35-39.9 kg/m2 Severe obesity (Class II) Increased incidence by 136%  >40 kg/m2 Extreme obesity (Class III) Increased incidence by 254%   BMI Readings from Last 4 Encounters:  09/13/16 24.19 kg/m  09/06/16 24.37 kg/m  08/16/16 23.81 kg/m  08/15/16 25.00 kg/m   Wt Readings from Last 4 Encounters:  09/13/16 128 lb (58.1 kg)  09/06/16 129 lb (58.5 kg)  08/16/16 126 lb (57.2 kg)  08/15/16 128 lb (58.1 kg)  Psych/Mental status: Alert, oriented x 3 (person, place, & time) Eyes: PERLA Respiratory: No evidence of acute respiratory distress  Cervical Spine Exam  Inspection: No masses, redness, or swelling Alignment: Symmetrical Functional ROM: Unrestricted ROM Stability: No instability detected Muscle strength & Tone: Functionally intact Sensory: Unimpaired Palpation: Non-contributory  Upper Extremity (UE) Exam    Side: Right upper extremity  Side: Left upper extremity  Inspection: No masses, redness, swelling, or asymmetry  Inspection: No masses, redness, swelling, or asymmetry  Functional ROM: Unrestricted ROM         Functional ROM: Unrestricted ROM          Muscle strength & Tone: Functionally intact  Muscle strength & Tone: Functionally intact  Sensory: Unimpaired  Sensory: Unimpaired  Palpation: Non-contributory  Palpation: Non-contributory   Thoracic Spine Exam  Inspection: No masses, redness, or swelling Alignment: Symmetrical Functional ROM: Unrestricted ROM Stability: No instability detected Sensory: Unimpaired Muscle strength & Tone: Functionally intact Palpation: Non-contributory  Lumbar Spine Exam  Inspection: No masses, redness, or swelling Alignment: Symmetrical Functional ROM:  Unrestricted ROM Stability: No instability detected Muscle strength & Tone: Functionally intact Sensory: Unimpaired Palpation: Non-contributory Provocative Tests: Lumbar Hyperextension and rotation test: evaluation deferred today       Patrick's Maneuver: evaluation deferred today              Gait & Posture Assessment  Ambulation: Unassisted Gait: Relatively normal for age and body habitus Posture: WNL   Lower Extremity Exam    Side: Right lower extremity  Side: Left lower extremity  Inspection: No masses, redness, swelling, or asymmetry  Inspection: No masses, redness, swelling, or asymmetry  Functional ROM: Unrestricted ROM          Functional ROM: Unrestricted ROM          Muscle strength & Tone: Functionally intact  Muscle strength & Tone: Functionally intact  Sensory: Unimpaired  Sensory: Unimpaired  Palpation: Non-contributory  Palpation: Non-contributory   Assessment  Primary Diagnosis & Pertinent Problem List: The primary encounter diagnosis was Osteoarthritis of both hips, unspecified osteoarthritis type. Diagnoses of Lumbar facet syndrome (Bilateral) (R>L), Lumbar facet arthropathy, Chronic hip pain (Location of Secondary source of pain) (Left), and Chronic upper back pain (midline) were also pertinent  to this visit.  Visit Diagnosis: 1. Osteoarthritis of both hips, unspecified osteoarthritis type   2. Lumbar facet syndrome (Bilateral) (R>L)   3. Lumbar facet arthropathy   4. Chronic hip pain (Location of Secondary source of pain) (Left)   5. Chronic upper back pain (midline)    Plan of Care  Pharmacotherapy (Medications Ordered): No orders of the defined types were placed in this encounter.  New Prescriptions   No medications on file   Medications administered today: Ms. Bhat had no medications administered during this visit. Lab-work, procedure(s), and/or referral(s): Orders Placed This Encounter  Procedures  . HIP INJECTION   Imaging and/or  referral(s): None  Interventional therapies: Planned, scheduled, and/or pending:   Diagnostic Left intra-articular hip injection.   Considering:   Diagnostic bilateral lumbar facet block #2 under fluoro and sedation. Diagnostic bilateral thoracolumbar facet block. Possible bilateral thoracolumbar facet radiofrequency ablation.  Diagnostic left intra-articular hip joint injection. Diagnostic left femoral and obturator nerve articular branch blocks.  Possible left femoral and obturator nerve articular branch radiofrequency ablation.  Diagnostic bilateral genicular nerve blocks.  Possible bilateral genicular nerve radiofrequency ablation.  Diagnostic intrathecal pump trial.    Palliative PRN treatment(s):   None at this time.    Provider-requested follow-up: Return for procedure, (ASAP).  Future Appointments Date Time Provider [place]  10/13/2016 10:45 AM Milinda Pointer, MD [facility] None  12/07/2016 10:00 AM [name], MD [facility] None   Primary Care Physician: [name], MD Location: [facility] Note by: Kathlen Brunswick. Dossie Arbour, M.D, DABA, DABAPM, DABPM, DABIPP, FIPP  Pain Score Disclaimer: We use the NRS-11 scale. This is a self-reported, subjective measurement of pain severity with only modest accuracy. It is used primarily to identify changes within a particular patient. It must be understood that outpatient pain scales are significantly less accurate that those used for research, where they can be applied under ideal controlled circumstances with minimal exposure to variables. In reality, the score is likely to be a combination of pain intensity and pain affect, where pain affect describes the degree of emotional arousal or changes in action readiness caused by the sensory experience of pain. Factors such as social and work situation, setting, emotional state, anxiety levels, expectation, and prior pain experience may influence  pain perception and show large inter-individual differences that may also be affected by time variables.  Patient instructions provided during this appointment: Patient Instructions   GENERAL RISKS AND COMPLICATIONS  What are the risk, side effects and possible complications? Generally speaking, most procedures are safe.  However, with any procedure there are risks, side effects, and the possibility of complications.  The risks and complications are dependent upon the sites that are lesioned, or the type of nerve block to be performed.  The closer the procedure is to the spine, the more serious the risks are.  Great care is taken when placing the radio frequency needles, block needles or lesioning probes, but sometimes complications can occur. 1. Infection: Any time there is an injection through the skin, there is a risk of infection.  This is why sterile conditions are used for these blocks.  There are four possible types of infection. 1. Localized skin infection. 2. Central Nervous System Infection-This can be in the form of Meningitis, which can be deadly. 3. Epidural Infections-This can be in the form of an epidural abscess, which can cause pressure inside of the spine, causing compression of the spinal cord with subsequent paralysis. This would require  an emergency surgery to decompress, and there are no guarantees that the patient would recover from the paralysis. 4. Discitis-This is an infection of the intervertebral discs.  It occurs in about 1% of discography procedures.  It is difficult to treat and it may lead to surgery.        2. Pain: the needles have to go through skin and soft tissues, will cause soreness.       3. Damage to internal structures:  The nerves to be lesioned may be near blood vessels or    other nerves which can be potentially damaged.       4. Bleeding: Bleeding is more common if the patient is taking blood thinners such as  aspirin, Coumadin, Ticiid, Plavix, etc., or  if he/she have some genetic predisposition  such as hemophilia. Bleeding into the spinal canal can cause compression of the spinal  cord with subsequent paralysis.  This would require an emergency surgery to  decompress and there are no guarantees that the patient would recover from the  paralysis.       5. Pneumothorax:  Puncturing of a lung is a possibility, every time a needle is introduced in  the area of the chest or upper back.  Pneumothorax refers to free air around the  collapsed lung(s), inside of the thoracic cavity (chest cavity).  Another two possible  complications related to a similar event would include: Hemothorax and Chylothorax.   These are variations of the Pneumothorax, where instead of air around the collapsed  lung(s), you may have blood or chyle, respectively.       6. Spinal headaches: They may occur with any procedures in the area of the spine.       7. Persistent CSF (Cerebro-Spinal Fluid) leakage: This is a rare problem, but may occur  with prolonged intrathecal or epidural catheters either due to the formation of a fistulous  track or a dural tear.       8. Nerve damage: By working so close to the spinal cord, there is always a possibility of  nerve damage, which could be as serious as a permanent spinal cord injury with  paralysis.       9. Death:  Although rare, severe deadly allergic reactions known as "Anaphylactic  reaction" can occur to any of the medications used.      10. Worsening of the symptoms:  We can always make thing worse.  What are the chances of something like this happening? Chances of any of this occuring are extremely low.  By statistics, you have more of a chance of getting killed in a motor vehicle accident: while driving to the hospital than any of the above occurring .  Nevertheless, you should be aware that they are possibilities.  In general, it is similar to taking a shower.  Everybody knows that you can slip, hit your head and get killed.  Does that  mean that you should not shower again?  Nevertheless always keep in mind that statistics do not mean anything if you happen to be on the wrong side of them.  Even if a procedure has a 1 (one) in a 1,000,000 (million) chance of going wrong, it you happen to be that one..Also, keep in mind that by statistics, you have more of a chance of having something go wrong when taking medications.  Who should not have this procedure? If you are on a blood thinning medication (e.g. Coumadin, Plavix, see list of "Blood  Thinners"), or if you have an active infection going on, you should not have the procedure.  If you are taking any blood thinners, please inform your physician.  How should I prepare for this procedure?  Do not eat or drink anything at least six hours prior to the procedure.  Bring a driver with you .  It cannot be a taxi.  Come accompanied by an adult that can drive you back, and that is strong enough to help you if your legs get weak or numb from the local anesthetic.  Take all of your medicines the morning of the procedure with just enough water to swallow them.  If you have diabetes, make sure that you are scheduled to have your procedure done first thing in the morning, whenever possible.  If you have diabetes, take only half of your insulin dose and notify our nurse that you have done so as soon as you arrive at the clinic.  If you are diabetic, but only take blood sugar pills (oral hypoglycemic), then do not take them on the morning of your procedure.  You may take them after you have had the procedure.  Do not take aspirin or any aspirin-containing medications, at least eleven (11) days prior to the procedure.  They may prolong bleeding.  Wear loose fitting clothing that may be easy to take off and that you would not mind if it got stained with Betadine or blood.  Do not wear any jewelry or perfume  Remove any nail coloring.  It will interfere with some of our monitoring  equipment.  NOTE: Remember that this is not meant to be interpreted as a complete list of all possible complications.  Unforeseen problems may occur.  BLOOD THINNERS The following drugs contain aspirin or other products, which can cause increased bleeding during surgery and should not be taken for 2 weeks prior to and 1 week after surgery.  If you should need take something for relief of minor pain, you may take acetaminophen which is found in Tylenol,m Datril, Anacin-3 and Panadol. It is not blood thinner. The products listed below are.  Do not take any of the products listed below in addition to any listed on your instruction sheet.  A.P.C or A.P.C with Codeine Codeine Phosphate Capsules #3 Ibuprofen Ridaura  ABC compound Congesprin Imuran rimadil  Advil Cope Indocin Robaxisal  Alka-Seltzer Effervescent Pain Reliever and Antacid Coricidin or Coricidin-D  Indomethacin Rufen  Alka-Seltzer plus Cold Medicine Cosprin Ketoprofen S-A-C Tablets  Anacin Analgesic Tablets or Capsules Coumadin Korlgesic Salflex  Anacin Extra Strength Analgesic tablets or capsules CP-2 Tablets Lanoril Salicylate  Anaprox Cuprimine Capsules Levenox Salocol  Anexsia-D Dalteparin Magan Salsalate  Anodynos Darvon compound Magnesium Salicylate Sine-off  Ansaid Dasin Capsules Magsal Sodium Salicylate  Anturane Depen Capsules Marnal Soma  APF Arthritis pain formula Dewitt's Pills Measurin Stanback  Argesic Dia-Gesic Meclofenamic Sulfinpyrazone  Arthritis Bayer Timed Release Aspirin Diclofenac Meclomen Sulindac  Arthritis pain formula Anacin Dicumarol Medipren Supac  Analgesic (Safety coated) Arthralgen Diffunasal Mefanamic Suprofen  Arthritis Strength Bufferin Dihydrocodeine Mepro Compound Suprol  Arthropan liquid Dopirydamole Methcarbomol with Aspirin Synalgos  ASA tablets/Enseals Disalcid Micrainin Tagament  Ascriptin Doan's Midol Talwin  Ascriptin A/D Dolene Mobidin Tanderil  Ascriptin Extra Strength Dolobid  Moblgesic Ticlid  Ascriptin with Codeine Doloprin or Doloprin with Codeine Momentum Tolectin  Asperbuf Duoprin Mono-gesic Trendar  Aspergum Duradyne Motrin or Motrin IB Triminicin  Aspirin plain, buffered or enteric coated Durasal Myochrisine Trigesic  Aspirin Suppositories Easprin Nalfon Trillsate  Aspirin with Codeine Ecotrin Regular or Extra Strength Naprosyn Uracel  Atromid-S Efficin Naproxen Ursinus  Auranofin Capsules Elmiron Neocylate Vanquish  Axotal Emagrin Norgesic Verin  Azathioprine Empirin or Empirin with Codeine Normiflo Vitamin E  Azolid Emprazil Nuprin Voltaren  Bayer Aspirin plain, buffered or children's or timed BC Tablets or powders Encaprin Orgaran Warfarin Sodium  Buff-a-Comp Enoxaparin Orudis Zorpin  Buff-a-Comp with Codeine Equegesic Os-Cal-Gesic   Buffaprin Excedrin plain, buffered or Extra Strength Oxalid   Bufferin Arthritis Strength Feldene Oxphenbutazone   Bufferin plain or Extra Strength Feldene Capsules Oxycodone with Aspirin   Bufferin with Codeine Fenoprofen Fenoprofen Pabalate or Pabalate-SF   Buffets II Flogesic Panagesic   Buffinol plain or Extra Strength Florinal or Florinal with Codeine Panwarfarin   Buf-Tabs Flurbiprofen Penicillamine   Butalbital Compound Four-way cold tablets Penicillin   Butazolidin Fragmin Pepto-Bismol   Carbenicillin Geminisyn Percodan   Carna Arthritis Reliever Geopen Persantine   Carprofen Gold's salt Persistin   Chloramphenicol Goody's Phenylbutazone   Chloromycetin Haltrain Piroxlcam   Clmetidine heparin Plaquenil   Cllnoril Hyco-pap Ponstel   Clofibrate Hydroxy chloroquine Propoxyphen         Before stopping any of these medications, be sure to consult the physician who ordered them.  Some, such as Coumadin (Warfarin) are ordered to prevent or treat serious conditions such as "deep thrombosis", "pumonary embolisms", and other heart problems.  The amount of time that you may need off of the medication may also vary with  the medication and the reason for which you were taking it.  If you are taking any of these medications, please make sure you notify your pain physician before you undergo any procedures.          Trigger Point Injection Trigger points are areas where you have muscle pain. A trigger point injection is a shot given in the trigger point to relieve that pain. A trigger point might feel like a knot in your muscle. It hurts to press on a trigger point. Sometimes the pain spreads out (radiates) to other parts of the body. For example, pressing on a trigger point in your shoulder might cause pain in your arm or neck. You might have one trigger point. Or, you might have more than one. People often have trigger points in their upper back and lower back. They also occur often in the neck and shoulders. Pain from a trigger point lasts for a long time. It can make it hard to keep moving. You might not be able to do the exercise or physical therapy that could help you deal with the pain. A trigger point injection may help. It does not work for everyone. But, it may relieve your pain for a few days or a few months. A trigger point injection does not cure long-lasting (chronic) pain. LET YOUR CAREGIVER KNOW ABOUT:  Any allergies (especially to latex, lidocaine, or steroids).  Blood-thinning medicines that you take. These drugs can lead to bleeding or bruising after an injection. They include:  Aspirin.  Ibuprofen.  Clopidogrel.  Warfarin.  Other medicines you take. This includes all vitamins, herbs, eyedrops, over-the-counter medicines, and creams.  Use of steroids.  Recent infections.  Past problems with numbing medicines.  Bleeding problems.  Surgeries you have had.  Other health problems. RISKS AND COMPLICATIONS A trigger point injection is a safe treatment. However, problems may develop, such as:  Minor side effects usually go away in 1 to 2 days. These may  include:  Soreness.  Bruising.  Stiffness.  More serious problems are rare. But, they may include:  Bleeding under the skin (hematoma).  Skin infection.  Breaking off of the needle under your skin.  Lung puncture.  The trigger point injection may not work for you. BEFORE THE PROCEDURE You may need to stop taking any medicine that thins your blood. This is to prevent bleeding and bruising. Usually these medicines are stopped several days before the injection. No other preparation is needed. PROCEDURE  A trigger point injection can be given in your caregiver's office or in a clinic. Each injection takes 2 minutes or less.  Your caregiver will feel for trigger points. The caregiver may use a marker to circle the area for the injection.  The skin over the trigger point will be washed with a germ-killing (antiseptic) solution.  The caregiver pinches the spot for the injection.  Then, a very thin needle is used for the shot. You may feel pain or a twitching feeling when the needle enters the trigger point.  A numbing solution may be injected into the trigger point. Sometimes a drug to keep down swelling, redness, and warmth (inflammation) is also injected.  Your caregiver moves the needle around the trigger zone until the tightness and twitching goes away.  After the injection, your caregiver may put gentle pressure over the injection site.  Then it is covered with a bandage. AFTER THE PROCEDURE  You can go right home after the injection.  The bandage can be taken off after a few hours.  You may feel sore and stiff for 1 to 2 days.  Go back to your regular activities slowly. Your caregiver may ask you to stretch your muscles. Do not do anything that takes extra energy for a few days.  Follow your caregiver's instructions to manage and treat other pain.   This information is not intended to replace advice given to you by your health care provider. Make sure you discuss  any questions you have with your health care provider.   Document Released: 10/12/2011 Document Revised: 02/17/2013 Document Reviewed: 10/12/2011 Elsevier Interactive Patient Education [employer].

## 2016-09-13 NOTE — Progress Notes (Signed)
Safety precautions to be maintained throughout the outpatient stay will include: orient to surroundings, keep bed in low position, maintain call bell within reach at all times, provide assistance with transfer out of bed and ambulation.  

## 2016-09-13 NOTE — Patient Instructions (Addendum)
GENERAL RISKS AND COMPLICATIONS  What are the risk, side effects and possible complications? Generally speaking, most procedures are safe.  However, with any procedure there are risks, side effects, and the possibility of complications.  The risks and complications are dependent upon the sites that are lesioned, or the type of nerve block to be performed.  The closer the procedure is to the spine, the more serious the risks are.  Great care is taken when placing the radio frequency needles, block needles or lesioning probes, but sometimes complications can occur. 1. Infection: Any time there is an injection through the skin, there is a risk of infection.  This is why sterile conditions are used for these blocks.  There are four possible types of infection. 1. Localized skin infection. 2. Central Nervous System Infection-This can be in the form of Meningitis, which can be deadly. 3. Epidural Infections-This can be in the form of an epidural abscess, which can cause pressure inside of the spine, causing compression of the spinal cord with subsequent paralysis. This would require an emergency surgery to decompress, and there are no guarantees that the patient would recover from the paralysis. 4. Discitis-This is an infection of the intervertebral discs.  It occurs in about 1% of discography procedures.  It is difficult to treat and it may lead to surgery.        2. Pain: the needles have to go through skin and soft tissues, will cause soreness.       3. Damage to internal structures:  The nerves to be lesioned may be near blood vessels or    other nerves which can be potentially damaged.       4. Bleeding: Bleeding is more common if the patient is taking blood thinners such as  aspirin, Coumadin, Ticiid, Plavix, etc., or if he/she have some genetic predisposition  such as hemophilia. Bleeding into the spinal canal can cause compression of the spinal  cord with subsequent paralysis.  This would require an  emergency surgery to  decompress and there are no guarantees that the patient would recover from the  paralysis.       5. Pneumothorax:  Puncturing of a lung is a possibility, every time a needle is introduced in  the area of the chest or upper back.  Pneumothorax refers to free air around the  collapsed lung(s), inside of the thoracic cavity (chest cavity).  Another two possible  complications related to a similar event would include: Hemothorax and Chylothorax.   These are variations of the Pneumothorax, where instead of air around the collapsed  lung(s), you may have blood or chyle, respectively.       6. Spinal headaches: They may occur with any procedures in the area of the spine.       7. Persistent CSF (Cerebro-Spinal Fluid) leakage: This is a rare problem, but may occur  with prolonged intrathecal or epidural catheters either due to the formation of a fistulous  track or a dural tear.       8. Nerve damage: By working so close to the spinal cord, there is always a possibility of  nerve damage, which could be as serious as a permanent spinal cord injury with  paralysis.       9. Death:  Although rare, severe deadly allergic reactions known as "Anaphylactic  reaction" can occur to any of the medications used.      10. Worsening of the symptoms:  We can always make thing worse.    What are the chances of something like this happening? Chances of any of this occuring are extremely low.  By statistics, you have more of a chance of getting killed in a motor vehicle accident: while driving to the hospital than any of the above occurring .  Nevertheless, you should be aware that they are possibilities.  In general, it is similar to taking a shower.  Everybody knows that you can slip, hit your head and get killed.  Does that mean that you should not shower again?  Nevertheless always keep in mind that statistics do not mean anything if you happen to be on the wrong side of them.  Even if a procedure has a 1  (one) in a 1,000,000 (million) chance of going wrong, it you happen to be that one..Also, keep in mind that by statistics, you have more of a chance of having something go wrong when taking medications.  Who should not have this procedure? If you are on a blood thinning medication (e.g. Coumadin, Plavix, see list of "Blood Thinners"), or if you have an active infection going on, you should not have the procedure.  If you are taking any blood thinners, please inform your physician.  How should I prepare for this procedure?  Do not eat or drink anything at least six hours prior to the procedure.  Bring a driver with you .  It cannot be a taxi.  Come accompanied by an adult that can drive you back, and that is strong enough to help you if your legs get weak or numb from the local anesthetic.  Take all of your medicines the morning of the procedure with just enough water to swallow them.  If you have diabetes, make sure that you are scheduled to have your procedure done first thing in the morning, whenever possible.  If you have diabetes, take only half of your insulin dose and notify our nurse that you have done so as soon as you arrive at the clinic.  If you are diabetic, but only take blood sugar pills (oral hypoglycemic), then do not take them on the morning of your procedure.  You may take them after you have had the procedure.  Do not take aspirin or any aspirin-containing medications, at least eleven (11) days prior to the procedure.  They may prolong bleeding.  Wear loose fitting clothing that may be easy to take off and that you would not mind if it got stained with Betadine or blood.  Do not wear any jewelry or perfume  Remove any nail coloring.  It will interfere with some of our monitoring equipment.  NOTE: Remember that this is not meant to be interpreted as a complete list of all possible complications.  Unforeseen problems may occur.  BLOOD THINNERS The following drugs  contain aspirin or other products, which can cause increased bleeding during surgery and should not be taken for 2 weeks prior to and 1 week after surgery.  If you should need take something for relief of minor pain, you may take acetaminophen which is found in Tylenol,m Datril, Anacin-3 and Panadol. It is not blood thinner. The products listed below are.  Do not take any of the products listed below in addition to any listed on your instruction sheet.  A.P.C or A.P.C with Codeine Codeine Phosphate Capsules #3 Ibuprofen Ridaura  ABC compound Congesprin Imuran rimadil  Advil Cope Indocin Robaxisal  Alka-Seltzer Effervescent Pain Reliever and Antacid Coricidin or Coricidin-D  Indomethacin Rufen    Alka-Seltzer plus Cold Medicine Cosprin Ketoprofen S-A-C Tablets  Anacin Analgesic Tablets or Capsules Coumadin Korlgesic Salflex  Anacin Extra Strength Analgesic tablets or capsules CP-2 Tablets Lanoril Salicylate  Anaprox Cuprimine Capsules Levenox Salocol  Anexsia-D Dalteparin Magan Salsalate  Anodynos Darvon compound Magnesium Salicylate Sine-off  Ansaid Dasin Capsules Magsal Sodium Salicylate  Anturane Depen Capsules Marnal Soma  APF Arthritis pain formula Dewitt's Pills Measurin Stanback  Argesic Dia-Gesic Meclofenamic Sulfinpyrazone  Arthritis Bayer Timed Release Aspirin Diclofenac Meclomen Sulindac  Arthritis pain formula Anacin Dicumarol Medipren Supac  Analgesic (Safety coated) Arthralgen Diffunasal Mefanamic Suprofen  Arthritis Strength Bufferin Dihydrocodeine Mepro Compound Suprol  Arthropan liquid Dopirydamole Methcarbomol with Aspirin Synalgos  ASA tablets/Enseals Disalcid Micrainin Tagament  Ascriptin Doan's Midol Talwin  Ascriptin A/D Dolene Mobidin Tanderil  Ascriptin Extra Strength Dolobid Moblgesic Ticlid  Ascriptin with Codeine Doloprin or Doloprin with Codeine Momentum Tolectin  Asperbuf Duoprin Mono-gesic Trendar  Aspergum Duradyne Motrin or Motrin IB Triminicin  Aspirin  plain, buffered or enteric coated Durasal Myochrisine Trigesic  Aspirin Suppositories Easprin Nalfon Trillsate  Aspirin with Codeine Ecotrin Regular or Extra Strength Naprosyn Uracel  Atromid-S Efficin Naproxen Ursinus  Auranofin Capsules Elmiron Neocylate Vanquish  Axotal Emagrin Norgesic Verin  Azathioprine Empirin or Empirin with Codeine Normiflo Vitamin E  Azolid Emprazil Nuprin Voltaren  Bayer Aspirin plain, buffered or children's or timed BC Tablets or powders Encaprin Orgaran Warfarin Sodium  Buff-a-Comp Enoxaparin Orudis Zorpin  Buff-a-Comp with Codeine Equegesic Os-Cal-Gesic   Buffaprin Excedrin plain, buffered or Extra Strength Oxalid   Bufferin Arthritis Strength Feldene Oxphenbutazone   Bufferin plain or Extra Strength Feldene Capsules Oxycodone with Aspirin   Bufferin with Codeine Fenoprofen Fenoprofen Pabalate or Pabalate-SF   Buffets II Flogesic Panagesic   Buffinol plain or Extra Strength Florinal or Florinal with Codeine Panwarfarin   Buf-Tabs Flurbiprofen Penicillamine   Butalbital Compound Four-way cold tablets Penicillin   Butazolidin Fragmin Pepto-Bismol   Carbenicillin Geminisyn Percodan   Carna Arthritis Reliever Geopen Persantine   Carprofen Gold's salt Persistin   Chloramphenicol Goody's Phenylbutazone   Chloromycetin Haltrain Piroxlcam   Clmetidine heparin Plaquenil   Cllnoril Hyco-pap Ponstel   Clofibrate Hydroxy chloroquine Propoxyphen         Before stopping any of these medications, be sure to consult the physician who ordered them.  Some, such as Coumadin (Warfarin) are ordered to prevent or treat serious conditions such as "deep thrombosis", "pumonary embolisms", and other heart problems.  The amount of time that you may need off of the medication may also vary with the medication and the reason for which you were taking it.  If you are taking any of these medications, please make sure you notify your pain physician before you undergo any  procedures.          Trigger Point Injection Trigger points are areas where you have muscle pain. A trigger point injection is a shot given in the trigger point to relieve that pain. A trigger point might feel like a knot in your muscle. It hurts to press on a trigger point. Sometimes the pain spreads out (radiates) to other parts of the body. For example, pressing on a trigger point in your shoulder might cause pain in your arm or neck. You might have one trigger point. Or, you might have more than one. People often have trigger points in their upper back and lower back. They also occur often in the neck and shoulders. Pain from a trigger point lasts for a long time. It can  make it hard to keep moving. You might not be able to do the exercise or physical therapy that could help you deal with the pain. A trigger point injection may help. It does not work for everyone. But, it may relieve your pain for a few days or a few months. A trigger point injection does not cure long-lasting (chronic) pain. LET YOUR CAREGIVER KNOW ABOUT:  Any allergies (especially to latex, lidocaine, or steroids).  Blood-thinning medicines that you take. These drugs can lead to bleeding or bruising after an injection. They include:  Aspirin.  Ibuprofen.  Clopidogrel.  Warfarin.  Other medicines you take. This includes all vitamins, herbs, eyedrops, over-the-counter medicines, and creams.  Use of steroids.  Recent infections.  Past problems with numbing medicines.  Bleeding problems.  Surgeries you have had.  Other health problems. RISKS AND COMPLICATIONS A trigger point injection is a safe treatment. However, problems may develop, such as:  Minor side effects usually go away in 1 to 2 days. These may include:  Soreness.  Bruising.  Stiffness.  More serious problems are rare. But, they may include:  Bleeding under the skin (hematoma).  Skin infection.  Breaking off of the needle under  your skin.  Lung puncture.  The trigger point injection may not work for you. BEFORE THE PROCEDURE You may need to stop taking any medicine that thins your blood. This is to prevent bleeding and bruising. Usually these medicines are stopped several days before the injection. No other preparation is needed. PROCEDURE  A trigger point injection can be given in your caregiver's office or in a clinic. Each injection takes 2 minutes or less.  Your caregiver will feel for trigger points. The caregiver may use a marker to circle the area for the injection.  The skin over the trigger point will be washed with a germ-killing (antiseptic) solution.  The caregiver pinches the spot for the injection.  Then, a very thin needle is used for the shot. You may feel pain or a twitching feeling when the needle enters the trigger point.  A numbing solution may be injected into the trigger point. Sometimes a drug to keep down swelling, redness, and warmth (inflammation) is also injected.  Your caregiver moves the needle around the trigger zone until the tightness and twitching goes away.  After the injection, your caregiver may put gentle pressure over the injection site.  Then it is covered with a bandage. AFTER THE PROCEDURE  You can go right home after the injection.  The bandage can be taken off after a few hours.  You may feel sore and stiff for 1 to 2 days.  Go back to your regular activities slowly. Your caregiver may ask you to stretch your muscles. Do not do anything that takes extra energy for a few days.  Follow your caregiver's instructions to manage and treat other pain.   This information is not intended to replace advice given to you by your health care provider. Make sure you discuss any questions you have with your health care provider.   Document Released: 10/12/2011 Document Revised: 02/17/2013 Document Reviewed: 10/12/2011 Elsevier Interactive Patient Education [employer].

## 2016-10-02 ENCOUNTER — Telehealth: Payer: Self-pay | Admitting: *Deleted

## 2016-10-02 ENCOUNTER — Telehealth: Payer: Self-pay

## 2016-10-02 NOTE — Telephone Encounter (Signed)
Patient called and stated that her low back pain has returned and wanted to know what she should do. Awaiting insurance approval for hip injection. Instructed patient that she should wait and see if hip injection gets approval and talk with Dr. Dossie Arbour ABOUT THE FACET INJECTION AT NEXT VISIT. aNGIE CALLED AND CONFIRMED THAT PRIOR AUTH FOR HIP INJECTION IS IN PROGRESS. PATIENT UNDERSTANDS POC.

## 2016-10-02 NOTE — Telephone Encounter (Signed)
The patient has called and left three voicemails asking if her insurance authorization has been received for her procedure. Please update her on the status.

## 2016-10-02 NOTE — Telephone Encounter (Signed)
Patient also called me this am - procedure is pending Brittany Mays - patient informed that I will contact her to schedule as soon as I receive the authorization information.

## 2016-10-06 ENCOUNTER — Telehealth: Payer: Self-pay | Admitting: *Deleted

## 2016-10-06 NOTE — Telephone Encounter (Signed)
Pt requested a call in reference to a medication, pt did not give reason  Pt contact [phone]

## 2016-10-09 ENCOUNTER — Ambulatory Visit (HOSPITAL_BASED_OUTPATIENT_CLINIC_OR_DEPARTMENT_OTHER): Payer: Commercial Managed Care - HMO | Admitting: Pain Medicine

## 2016-10-09 ENCOUNTER — Encounter: Payer: Self-pay | Admitting: Pain Medicine

## 2016-10-09 ENCOUNTER — Ambulatory Visit
Admission: RE | Admit: 2016-10-09 | Discharge: 2016-10-09 | Disposition: A | Discharge status: Home / Self Care | Payer: Commercial Managed Care - HMO | Source: Ambulatory Visit | Attending: Pain Medicine | Admitting: Pain Medicine

## 2016-10-09 VITALS — BP 101/62 | HR 78 | Temp 98.7°F | Resp 16 | Ht 60.0 in | Wt 126.0 lb

## 2016-10-09 DIAGNOSIS — M160 Bilateral primary osteoarthritis of hip: Secondary | ICD-10-CM | POA: Insufficient documentation

## 2016-10-09 DIAGNOSIS — Z885 Allergy status to narcotic agent status: Secondary | ICD-10-CM | POA: Insufficient documentation

## 2016-10-09 DIAGNOSIS — Z888 Allergy status to other drugs, medicaments and biological substances status: Secondary | ICD-10-CM | POA: Insufficient documentation

## 2016-10-09 DIAGNOSIS — G8929 Other chronic pain: Secondary | ICD-10-CM

## 2016-10-09 DIAGNOSIS — M25552 Pain in left hip: Secondary | ICD-10-CM

## 2016-10-09 MED ORDER — IOPAMIDOL (ISOVUE-M 200) INJECTION 41%
10.0000 mL | Freq: Once | INTRAMUSCULAR | Status: AC
  Administered 2016-10-09: 10 mL via INTRA_ARTICULAR
  Filled 2016-10-09: qty 10

## 2016-10-09 MED ORDER — ROPIVACAINE HCL 2 MG/ML IJ SOLN
4.0000 mL | Freq: Once | INTRAMUSCULAR | Status: AC
  Administered 2016-10-09: 4 mL
  Filled 2016-10-09: qty 10

## 2016-10-09 MED ORDER — LIDOCAINE HCL (PF) 1 % IJ SOLN
10.0000 mL | Freq: Once | INTRAMUSCULAR | Status: AC
  Administered 2016-10-09: 10 mL
  Filled 2016-10-09: qty 10

## 2016-10-09 MED ORDER — METHYLPREDNISOLONE ACETATE 80 MG/ML IJ SUSP
80.0000 mg | Freq: Once | INTRAMUSCULAR | Status: AC
  Administered 2016-10-09: 80 mg
  Filled 2016-10-09: qty 1

## 2016-10-09 NOTE — Progress Notes (Signed)
Safety precautions to be maintained throughout the outpatient stay will include: orient to surroundings, keep bed in low position, maintain call bell within reach at all times, provide assistance with transfer out of bed and ambulation.  

## 2016-10-09 NOTE — Telephone Encounter (Signed)
Spoke with patient and she wants to know if there is a higher dose of Cymbalta that she can take. She said that the depression comes and goes. She does not have thoughts of hurting herself or others. Please advise

## 2016-10-09 NOTE — Progress Notes (Signed)
Patient's Name: Brittany Mays  MRN: [id number]  Referring Provider: Milinda Pointer, MD  DOB: 06-May-1941  PCP: [name], MD  DOS: 10/09/2016  Note by: Kathlen Brunswick. Dossie Arbour, MD  Service setting: Ambulatory outpatient  Location: [facility] (AMB) Pain Management Facility  Visit type: Procedure  Specialty: Interventional Pain Management  Patient type: Established   Primary Reason for Visit: Interventional Pain Management Treatment. CC: Hip Pain (left)  Procedure:  Anesthesia, Analgesia, Anxiolysis:  Type: Therapeutic Intra-Articular Hip Injection Region:  Posterolateral hip joint area. Level: Lower pelvic and hip joint level. Laterality: Left-Sided  Type: Local Anesthesia with Moderate (Conscious) Sedation Local Anesthetic: Lidocaine 1% Route: Intravenous (IV) IV Access: Secured Sedation: Meaningful verbal contact was maintained at all times during the procedure  Indication(s): Analgesia and Anxiety  Indications: 1. Osteoarthritis of both hips, unspecified osteoarthritis type   2. Chronic hip pain (Location of Secondary source of pain) (Left)    Pain Score: Pre-procedure: 5 /10 Post-procedure: 0-No pain/10  Pre-Procedure Assessment:  Brittany Mays is a 75 y.o. (year old), female patient, seen today for interventional treatment. She  has a past surgical history that includes Abdominal hysterectomy; Knee surgery (Bilateral, 1990's nd 2002); Spine surgery; Parathyroidectomy [id number]); colonoscopy (2013); Appendectomy; Breast lumpectomy; Tonsillectomy; and Back surgery (07-2015).. Her primarily concern today is the Hip Pain (left) The primary encounter diagnosis was Osteoarthritis of both hips, unspecified osteoarthritis type. A diagnosis of Chronic hip pain (Location of Secondary source of pain) (Left) was also pertinent to this visit.  Pain Type: Chronic pain Pain Location: Hip Pain Orientation: Left Pain Descriptors / Indicators: Sharp, Aching, Constant Pain Frequency: Constant  Date  of Last Visit: 09/13/16 Service Provided on Last Visit: Med Refill, Evaluation  Coagulation Parameters Lab Results  Component Value Date   PLT 168.0 09/07/2015   Verification of the correct person, correct site (including marking of site), and correct procedure were performed and confirmed by the patient.  Consent: Before the procedure and under the influence of no sedative(s), amnesic(s), or anxiolytics, the patient was informed of the treatment options, risks and possible complications. To fulfill our ethical and legal obligations, as recommended by the American Medical Association's Code of Ethics, I have informed the patient of my clinical impression; the nature and purpose of the treatment or procedure; the risks, benefits, and possible complications of the intervention; the alternatives, including doing nothing; the risk(s) and benefit(s) of the alternative treatment(s) or procedure(s); and the risk(s) and benefit(s) of doing nothing. The patient was provided information about the general risks and possible complications associated with the procedure. These may include, but are not limited to: failure to achieve desired goals, infection, bleeding, organ or nerve damage, allergic reactions, paralysis, and death. In addition, the patient was informed of those risks and complications associated to the procedure, such as failure to decrease pain; infection; bleeding; organ or nerve damage with subsequent damage to sensory, motor, and/or autonomic systems, resulting in permanent pain, numbness, and/or weakness of one or several areas of the body; allergic reactions; (i.e.: anaphylactic reaction); and/or death. Furthermore, the patient was informed of those risks and complications associated with the medications. These include, but are not limited to: allergic reactions (i.e.: anaphylactic or anaphylactoid reaction(s)); adrenal axis suppression; blood sugar elevation that in diabetics may result in  ketoacidosis or comma; water retention that in patients with history of congestive heart failure may result in shortness of breath, pulmonary edema, and decompensation with resultant heart failure; weight gain; swelling or edema; medication-induced neural toxicity;  particulate matter embolism and blood vessel occlusion with resultant organ, and/or nervous system infarction; and/or aseptic necrosis of one or more joints. Finally, the patient was informed that Medicine is not an exact science; therefore, there is also the possibility of unforeseen or unpredictable risks and/or possible complications that may result in a catastrophic outcome. The patient indicated having understood very clearly. We have given the patient no guarantees and we have made no promises. Enough time was given to the patient to ask questions, all of which were answered to the patient's satisfaction. Ms. Grenier has indicated that she wanted to continue with the procedure.  Consent Attestation: I, the ordering provider, attest that I have discussed with the patient the benefits, risks, side-effects, alternatives, likelihood of achieving goals, and potential problems during recovery for the procedure that I have provided informed consent.  Pre-Procedure Preparation:  Safety Precautions: Allergies reviewed. The patient was asked about blood thinners, or active infections, both of which were denied. The patient was asked to confirm the procedure and laterality, before marking the site, and again before commencing the procedure. Appropriate site, procedure, and patient were confirmed by following the Joint Commission's Universal Protocol (UP.01.01.01), in the form of a "Time Out". The patient was asked to participate by confirming the accuracy of the "Time Out" information. Patient was assessed for positional comfort and pressure points before starting the procedure. Allergies: She is allergic to nsaids; pentothal [thiopental]; and  fentanyl. Allergy Precautions: None required Infection Control Precautions: Sterile technique used. Standard Universal Precautions were taken as recommended by the Department of [facility] for Disease Control and Prevention (CDC). Standard pre-surgical skin prep was conducted. Respiratory hygiene and cough etiquette was practiced. Hand hygiene observed. Safe injection practices and needle disposal techniques followed. SDV (single dose vial) medications used. Medications properly checked for expiration dates and contaminants. Personal protective equipment (PPE) used as per protocol. Monitoring:  As per clinic protocol. Vitals:   10/09/16 1045 10/09/16 1055 10/09/16 1105 10/09/16 1110  BP: (!) 91/50 (!) 102/48 (!) 99/58 101/62  Pulse: 73 77 80 78  Resp: 12 16 14 16   Temp:      TempSrc:      SpO2: 99% 95% 99% 99%  Weight:      Height:      Calculated BMI: Body mass index is 24.61 kg/m. Time-out: "Time-out" completed before starting procedure, as per protocol.  Description of Procedure Process:   Time-out: "Time-out" completed before starting procedure, as per protocol. Position: Right Lateral Decubitus. Target Area: Superior aspect of the hip joint cavity, going thru the superior portion of the capsular ligament. Approach: Posterolateral approach. Area Prepped: Entire Posterolateral hip area. Prepping solution: ChloraPrep (2% chlorhexidine gluconate and 70% isopropyl alcohol) Safety Precautions: Aspiration looking for blood return was conducted prior to all injections. At no point did we inject any substances, as a needle was being advanced. No attempts were made at seeking any paresthesias. Safe injection practices and needle disposal techniques used. Medications properly checked for expiration dates. SDV (single dose vial) medications used. Description of the Procedure: Protocol guidelines were followed. The patient was placed in position over the fluoroscopy table. The target  area was identified and the area prepped in the usual manner. Skin & deeper tissues infiltrated with local anesthetic. Appropriate amount of time allowed to pass for local anesthetics to take effect. The procedure needles were then advanced to the target area. Proper needle placement secured. Negative aspiration confirmed. Solution injected in intermittent fashion, asking for systemic  symptoms every 0.5cc of injectate. The needles were then removed and the area cleansed, making sure to leave some of the prepping solution back to take advantage of its long term bactericidal properties. EBL: Minimal Materials & Medications Used:  Needle(s) Used: 22g - 7" Spinal Needle(s) Solution Injected: 0.2% PF-Ropivacaine (2ml) + SDV-DepoMedrol 80 mg/ml (69ml) Medications Administered today: We administered methylPREDNISolone acetate, ropivacaine (PF) 2 mg/mL (0.2%), lidocaine (PF), and iopamidol.Please see chart orders for dosing details.  Imaging Guidance (Non-Spinal):  Type of Imaging Technique: Fluoroscopy Guidance (Non-Spinal) Indication(s): Assistance in needle guidance and placement for procedures requiring needle placement in or near specific anatomical locations not easily accessible without such assistance. Exposure Time: Please see nurses notes. Contrast: Before injecting any contrast, we confirmed that the patient did not have an allergy to iodine, shellfish, or radiological contrast. Once satisfactory needle placement was completed at the desired level, radiological contrast was injected. Contrast injected under live fluoroscopy. No contrast complications. See chart for type and volume of contrast used. Fluoroscopic Guidance: I was personally present during the use of fluoroscopy. "Tunnel Vision Technique" used to obtain the best possible view of the target area. Parallax error corrected before commencing the procedure. "Direction-depth-direction" technique used to introduce the needle under continuous  pulsed fluoroscopy. Once target was reached, antero-posterior, oblique, and lateral fluoroscopic projection used confirm needle placement in all planes. Images permanently stored in EMR. Interpretation: I personally interpreted the imaging intraoperatively. Adequate needle placement confirmed in multiple planes. Appropriate spread of contrast into desired area was observed. No evidence of afferent or efferent intravascular uptake. Permanent images saved into the patient's record.  Antibiotic Prophylaxis:  Indication(s): No indications identified. Type:  Antibiotics Given (last 72 hours)    None      Post-operative Assessment:  Complications: No immediate post-treatment complications observed by team, or reported by patient. Disposition: The patient tolerated the entire procedure well. A repeat set of vitals were taken after the procedure and the patient was kept under observation following institutional policy, for this type of procedure. Post-procedural neurological assessment was performed, showing return to baseline, prior to discharge. The patient was provided with post-procedure discharge instructions, including a section on how to identify potential problems. Should any problems arise concerning this procedure, the patient was given instructions to immediately contact us, at any time, without hesitation. In any case, we plan to contact the patient by telephone for a follow-up status report regarding this interventional procedure. Comments:  No additional relevant information.  Plan of Care  Discharge to: Discharge home  Medications ordered for procedure: Meds ordered this encounter  Medications  . methylPREDNISolone acetate (DEPO-MEDROL) injection 80 mg  . ropivacaine (PF) 2 mg/mL (0.2%) (NAROPIN) injection 4 mL  . lidocaine (PF) (XYLOCAINE) 1 % injection 10 mL  . iopamidol (ISOVUE-M) 41 % intrathecal injection 10 mL   Medications administered: (For more details, see medical  record) We administered methylPREDNISolone acetate, ropivacaine (PF) 2 mg/mL (0.2%), lidocaine (PF), and iopamidol. Lab-work, Procedure(s), & Referral(s) Ordered: Orders Placed This Encounter  Procedures  . DG C-Arm 1-60 Min-No Report   Imaging Ordered: Results for orders placed in visit on 08/15/16  DG C-Arm 1-60 Min-No Report   Narrative CLINICAL DATA: Assistance in needle guidance and placement for procedures  requiring needle placement in or near specific anatomical locations not  easily accessible without such assistance.   C-ARM 1-60 MINUTES  Fluoroscopy was utilized by the requesting physician.  No radiographic  interpretation.     New Prescriptions  No medications on file   Physician-requested Follow-up:  Return in about 2 weeks (around 10/23/2016) for Post-Procedure evaluation.  Future Appointments Date Time Provider [place]  10/12/2016 11:30 AM [facility]  10/13/2016 10:45 AM Milinda Pointer, MD [facility] None  10/16/2016 11:45 AM Flora Lipps, MD [facility] None  11/20/2016 1:45 PM Milinda Pointer, MD [facility] None  12/07/2016 10:00 AM [name], MD [facility] None   Primary Care Physician: [name], MD Location: [facility] Note by: Kathlen Brunswick. Dossie Arbour, M.D, DABA, DABAPM, DABPM, DABIPP, FIPP 10/09/1710:59 AM  Disclaimer:  Medicine is not an exact science. The only guarantee in medicine is that nothing is guaranteed. It is important to note that the decision to proceed with this intervention was based on the information collected from the patient. The Data and conclusions were drawn from the patient's questionnaire, the interview, and the physical examination. Because the information was provided in large part by the patient, it cannot be guaranteed that it has not been purposely or unconsciously manipulated. Every effort has been made to obtain as much relevant data as possible for this  evaluation. It is important to note that the conclusions that lead to this procedure are derived in large part from the available data. Always take into account that the treatment will also be dependent on availability of resources and existing treatment guidelines, considered by other Pain Management Practitioners as being common knowledge and practice, at the time of the intervention. For Medico-Legal purposes, it is also important to point out that variation in procedural techniques and pharmacological choices are the acceptable norm. The indications, contraindications, technique, and results of the above procedure should only be interpreted and judged by a Board-Certified Interventional Pain Specialist with extensive familiarity and expertise in the same exact procedure and technique. Attempts at providing opinions without similar or greater experience and expertise than that of the treating physician will be considered as inappropriate and unethical, and shall result in a formal complaint to the state medical board and applicable specialty societies.  Instructions provided at this appointment: There are no Patient Instructions on file for this visit.

## 2016-10-10 ENCOUNTER — Telehealth: Payer: Self-pay | Admitting: *Deleted

## 2016-10-10 NOTE — Telephone Encounter (Signed)
Spoke with patient's husband, no problems post procedure. 

## 2016-10-11 NOTE — Telephone Encounter (Signed)
Scheduled patient for 10/17/16

## 2016-10-11 NOTE — Telephone Encounter (Signed)
Unfortunately that is the maximum dose that was found to have any benefit for depression. If she is having issues with depression we should have her back in the office to discuss further given that she was doing better at her last visit. Thanks.

## 2016-10-12 ENCOUNTER — Ambulatory Visit
Admission: RE | Admit: 2016-10-12 | Discharge: 2016-10-12 | Disposition: A | Discharge status: Home / Self Care | Payer: Commercial Managed Care - HMO | Source: Ambulatory Visit | Attending: Internal Medicine | Admitting: Internal Medicine

## 2016-10-12 DIAGNOSIS — I712 Thoracic aortic aneurysm, without rupture, unspecified: Secondary | ICD-10-CM

## 2016-10-12 DIAGNOSIS — I700 Atherosclerosis of aorta: Secondary | ICD-10-CM | POA: Diagnosis not present

## 2016-10-12 DIAGNOSIS — M19011 Primary osteoarthritis, right shoulder: Secondary | ICD-10-CM | POA: Diagnosis not present

## 2016-10-12 DIAGNOSIS — M19012 Primary osteoarthritis, left shoulder: Secondary | ICD-10-CM | POA: Insufficient documentation

## 2016-10-12 DIAGNOSIS — R911 Solitary pulmonary nodule: Secondary | ICD-10-CM

## 2016-10-12 DIAGNOSIS — R918 Other nonspecific abnormal finding of lung field: Secondary | ICD-10-CM | POA: Insufficient documentation

## 2016-10-12 LAB — POCT I-STAT CREATININE: CREATININE: 0.8 mg/dL (ref 0.44–1.00)

## 2016-10-12 MED ORDER — IOPAMIDOL (ISOVUE-300) INJECTION 61%
75.0000 mL | Freq: Once | INTRAVENOUS | Status: AC | PRN
  Administered 2016-10-12: 75 mL via INTRAVENOUS

## 2016-10-13 ENCOUNTER — Ambulatory Visit: Payer: Commercial Managed Care - HMO | Admitting: Pain Medicine

## 2016-10-16 ENCOUNTER — Ambulatory Visit: Payer: Commercial Managed Care - HMO | Admitting: Internal Medicine

## 2016-10-17 ENCOUNTER — Encounter: Payer: Self-pay | Admitting: Internal Medicine

## 2016-10-17 ENCOUNTER — Ambulatory Visit (INDEPENDENT_AMBULATORY_CARE_PROVIDER_SITE_OTHER): Payer: Commercial Managed Care - HMO | Admitting: Internal Medicine

## 2016-10-17 ENCOUNTER — Ambulatory Visit: Payer: Commercial Managed Care - HMO | Admitting: Family Medicine

## 2016-10-17 VITALS — BP 122/68 | HR 82 | Ht 60.0 in | Wt 130.2 lb

## 2016-10-17 DIAGNOSIS — R911 Solitary pulmonary nodule: Secondary | ICD-10-CM

## 2016-10-17 NOTE — Progress Notes (Signed)
[place] [facility] Consultation      Date: 10/17/2016,   MRN# [id number] Brittany Mays 02-Sep-1941 Code Status:  Hosp day:@LENGTHOFSTAYDAYS @ Referring MD: @ATDPROV @     PCP:      AdmissionWeight: 130 lb 3.2 oz (59.1 kg)                 CurrentWeight: 130 lb 3.2 oz (59.1 kg) Brittany Mays is a 75 y.o. old female seen in consultation for lung nodule at the request of Dr. Caryl Bis.    CHIEF COMPLAINT:  Wheezing, cough, chest congestion -resolved    HISTORY OF PRESENT ILLNESS  Follow up  CT chest-results reviewed with patient STable RUL nodule over last 3 years and stable thoracic aneurysm Patient has no resp symptoms at this time, used ventolin inhlaer last week  Still smokes Smoking cessation advised  Ct chest results and PFT's reviewed with patient No signs of infection at this time She has had changes in her voice over last several months    Current Medication:  Current Outpatient Prescriptions:  .  Ascorbic Acid (VITAMIN C) 500 MG CAPS, Take by mouth daily., Disp: , Rfl:  .  diphenhydramine-acetaminophen (TYLENOL PM) 25-500 MG TABS tablet, Take 2 tablets by mouth at bedtime as needed., Disp: , Rfl:  .  DULoxetine (CYMBALTA) 60 MG capsule, Take 1 capsule (60 mg total) by mouth daily., Disp: 90 capsule, Rfl: 3 .  fish oil-omega-3 fatty acids 1000 MG capsule, Take 1 g by mouth daily. , Disp: , Rfl:  .  gabapentin (NEURONTIN) 600 MG tablet, 2 (two) times daily. 300 mg in a.m., 600 mg at night, Disp: , Rfl:  .  glucosamine-chondroitin 500-400 MG tablet, Take 2 tablets by mouth daily., Disp: , Rfl:  .  lubiprostone (AMITIZA) 8 MCG capsule, Take 1 capsule (8 mcg total) by mouth 2 (two) times daily with a meal. Swallow the medication whole. Do not break or chew the medication., Disp: 60 capsule, Rfl: PRN .  Melatonin 3 MG CAPS, Take 1 capsule (3 mg total) by mouth at bedtime as needed (insomnia). (Patient taking differently: Take 5 mg by mouth at bedtime as  needed (insomnia). ), Disp: 30 capsule, Rfl: 0 .  Multiple Vitamins-Minerals (WOMENS MULTI) CAPS, Take one tablet once daily, Disp: , Rfl:  .  naloxone (NARCAN) 2 MG/2ML injection, Inject content of syringe into thigh muscle. Call 911., Disp: 2 Syringe, Rfl: 1 .  Oxycodone HCl 10 MG TABS, Take 1 tablet (10 mg total) by mouth every 6 (six) hours as needed., Disp: 120 tablet, Rfl: 0 .  Polyethylene Glycol 3350 GRAN, , Disp: , Rfl:  .  Red Yeast Rice 600 MG CAPS, Take 2 capsules (1,200 mg total) by mouth daily., Disp: , Rfl:  .  SENNA LAXATIVE 25 MG TABS, as needed. , Disp: , Rfl:  .  vitamin B-12 (CYANOCOBALAMIN) 100 MCG tablet, Take 1,000 mcg by mouth every other day. , Disp: , Rfl:  .  Wheat Dextrin (BENEFIBER) POWD, Stir 2 tsp. TID into 4-8 oz of any non-carbonated beverage or soft food (hot or cold), Disp: 500 g, Rfl: PRN .  Oxycodone HCl 10 MG TABS, Take 1 tablet (10 mg total) by mouth every 6 (six) hours as needed., Disp: 120 tablet, Rfl: 0    ALLERGIES   Nsaids; Pentothal [thiopental]; and Fentanyl     REVIEW OF SYSTEMS   Review of Systems  Constitutional: Negative for chills, fever, malaise/fatigue and weight loss.  HENT: Negative for  congestion.   Respiratory: Negative for cough, hemoptysis, sputum production, shortness of breath and wheezing.   Cardiovascular: Negative for chest pain, palpitations, orthopnea and leg swelling.  Gastrointestinal: Negative for constipation, heartburn and nausea.  Genitourinary: Negative.   Neurological: Negative.   Psychiatric/Behavioral: Negative for depression. The patient is nervous/anxious.   All other systems reviewed and are negative.    VS: BP 122/68 (BP Location: Left Arm, Cuff Size: Normal)   Pulse 82   Ht 5' (1.524 m)   Wt 130 lb 3.2 oz (59.1 kg)   SpO2 97%   BMI 25.43 kg/m      PHYSICAL EXAM  Physical Exam  Constitutional: She is oriented to person, place, and time. She appears well-developed and well-nourished.  HENT:    Head: Normocephalic and atraumatic.  Mouth/Throat: No oropharyngeal exudate.  Neck: Neck supple.  Cardiovascular: Normal rate and regular rhythm.   No murmur heard. Pulmonary/Chest: Effort normal and breath sounds normal. No stridor. No respiratory distress. She has no wheezes.  Musculoskeletal: Normal range of motion. She exhibits no edema.  Neurological: She is alert and oriented to person, place, and time.  Skin: Skin is warm and dry.    Ct chest 04/2016 images reviewed 10/17/2016  IMPRESSION: Stable right upper lobe pulmonary nodules, largest measuring 4 mm. Non-contrast chest CT can be considered in 12 months given patient's known risk factor of smoking. This recommendation follows the consensus statement: Guidelines for Management of Incidental Pulmonary Nodules Detected on CT Images:From the Fleischner Society 2017; published online before print (10.1148/radiol.IJ:2314499).  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 Recommend annual imaging followup by CTA. This recommendation follows 2010 ACCF/AHA/AATS/ACR/ASA/SCA/SCAI/SIR/STS/SVM Guidelines for the Diagnosis and Management of Patients with Thoracic Aortic Disease. Circulation. 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 Patient has intermittent reactive airways disease also with 4 Cm ascending thoracic aortic aneurysm   1.albuterol as needed 2.smoking cessation strongly advised 3.Thoracic aneurysm stable over past 6 months 4.voice changes-ENT referral    Follow up in 6 months   I have personally obtained a  history, examined the patient, evaluated laboratory and independently reviewed imaging results, formulated the assessment and plan and placed orders.  The Patient requires high complexity decision making for assessment and support, frequent evaluation and titration of therapies, application of advanced monitoring technologies and extensive interpretation of multiple databases.   Patient satisfied with Plan of action and management. All questions answered  Corrin Parker, M.D.  [facility]  Medical Director [place] Director [facility]

## 2016-10-17 NOTE — Patient Instructions (Signed)
Steps to Quit Smoking Smoking tobacco can be bad for your health. It can also affect almost every organ in your body. Smoking puts you and people around you at risk for many serious long-lasting (chronic) diseases. Quitting smoking is hard, but it is one of the best things that you can do for your health. It is never too late to quit. What are the benefits of quitting smoking? When you quit smoking, you lower your risk for getting serious diseases and conditions. They can include:  Lung cancer or lung disease.  Heart disease.  Stroke.  Heart attack.  Not being able to have children (infertility).  Weak bones (osteoporosis) and broken bones (fractures). If you have coughing, wheezing, and shortness of breath, those symptoms may get better when you quit. You may also get sick less often. If you are pregnant, quitting smoking can help to lower your chances of having a baby of low birth weight. What can I do to help me quit smoking? Talk with your doctor about what can help you quit smoking. Some things you can do (strategies) include:  Quitting smoking totally, instead of slowly cutting back how much you smoke over a period of time.  Going to in-person counseling. You are more likely to quit if you go to many counseling sessions.  Using resources and support systems, such as:  Online chats with a counselor.  Phone quitlines.  Printed self-help materials.  Support groups or group counseling.  Text messaging programs.  Mobile phone apps or applications.  Taking medicines. Some of these medicines may have nicotine in them. If you are pregnant or breastfeeding, do not take any medicines to quit smoking unless your doctor says it is okay. Talk with your doctor about counseling or other things that can help you. Talk with your doctor about using more than one strategy at the same time, such as taking medicines while you are also going to in-person counseling. This can help make quitting  easier. What things can I do to make it easier to quit? Quitting smoking might feel very hard at first, but there is a lot that you can do to make it easier. Take these steps:  Talk to your family and friends. Ask them to support and encourage you.  Call phone quitlines, reach out to support groups, or work with a counselor.  Ask people who smoke to not smoke around you.  Avoid places that make you want (trigger) to smoke, such as:  Bars.  Parties.  Smoke-break areas at work.  Spend time with people who do not smoke.  Lower the stress in your life. Stress can make you want to smoke. Try these things to help your stress:  Getting regular exercise.  Deep-breathing exercises.  Yoga.  Meditating.  Doing a body scan. To do this, close your eyes, focus on one area of your body at a time from head to toe, and notice which parts of your body are tense. Try to relax the muscles in those areas.  Download or buy apps on your mobile phone or tablet that can help you stick to your quit plan. There are many free apps, such as QuitGuide from the CDC (Centers for Disease Control and Prevention). You can find more support from smokefree.gov and other websites. This information is not intended to replace advice given to you by your health care provider. Make sure you discuss any questions you have with your health care provider. Document Released: 08/19/2009 Document Revised: 06/20/2016 Document   Reviewed: 03/09/2015 Elsevier Interactive Patient Education  2017 [employer]  

## 2016-10-19 ENCOUNTER — Telehealth: Payer: Self-pay | Admitting: Internal Medicine

## 2016-10-19 DIAGNOSIS — R499 Unspecified voice and resonance disorder: Secondary | ICD-10-CM

## 2016-10-19 NOTE — Telephone Encounter (Signed)
Patient calling in regards to a ENT appt, please call patient.

## 2016-10-19 NOTE — Telephone Encounter (Signed)
referral has been placed to ENT. Pt had questions as to who we would refer to, I explained to pt that we typically refer to [facility]. Pt agreed with referral and voiced her understanding. Nothing further needed.

## 2016-10-25 ENCOUNTER — Ambulatory Visit: Payer: Commercial Managed Care - HMO | Admitting: Family Medicine

## 2016-10-25 ENCOUNTER — Telehealth: Payer: Self-pay | Admitting: Family Medicine

## 2016-10-25 NOTE — Telephone Encounter (Signed)
fyi

## 2016-10-25 NOTE — Telephone Encounter (Signed)
FYI - PT called and cancelled appt having car trouble.

## 2016-10-31 ENCOUNTER — Ambulatory Visit: Payer: Commercial Managed Care - HMO | Admitting: Family Medicine

## 2016-11-07 ENCOUNTER — Encounter: Payer: Commercial Managed Care - HMO | Admitting: Pain Medicine

## 2016-11-13 ENCOUNTER — Ambulatory Visit: Payer: Commercial Managed Care - HMO | Attending: Pain Medicine | Admitting: Pain Medicine

## 2016-11-13 ENCOUNTER — Encounter: Payer: Self-pay | Admitting: Pain Medicine

## 2016-11-13 VITALS — BP 134/73 | HR 100 | Temp 97.8°F | Resp 18 | Ht 60.0 in | Wt 126.0 lb

## 2016-11-13 DIAGNOSIS — F419 Anxiety disorder, unspecified: Secondary | ICD-10-CM | POA: Insufficient documentation

## 2016-11-13 DIAGNOSIS — Z888 Allergy status to other drugs, medicaments and biological substances status: Secondary | ICD-10-CM | POA: Diagnosis not present

## 2016-11-13 DIAGNOSIS — Z96653 Presence of artificial knee joint, bilateral: Secondary | ICD-10-CM | POA: Insufficient documentation

## 2016-11-13 DIAGNOSIS — E785 Hyperlipidemia, unspecified: Secondary | ICD-10-CM | POA: Diagnosis not present

## 2016-11-13 DIAGNOSIS — Z5181 Encounter for therapeutic drug level monitoring: Secondary | ICD-10-CM | POA: Diagnosis not present

## 2016-11-13 DIAGNOSIS — R1032 Left lower quadrant pain: Secondary | ICD-10-CM | POA: Insufficient documentation

## 2016-11-13 DIAGNOSIS — M4316 Spondylolisthesis, lumbar region: Secondary | ICD-10-CM | POA: Insufficient documentation

## 2016-11-13 DIAGNOSIS — Z981 Arthrodesis status: Secondary | ICD-10-CM | POA: Diagnosis not present

## 2016-11-13 DIAGNOSIS — M48061 Spinal stenosis, lumbar region without neurogenic claudication: Secondary | ICD-10-CM | POA: Diagnosis not present

## 2016-11-13 DIAGNOSIS — M961 Postlaminectomy syndrome, not elsewhere classified: Secondary | ICD-10-CM | POA: Diagnosis not present

## 2016-11-13 DIAGNOSIS — Z825 Family history of asthma and other chronic lower respiratory diseases: Secondary | ICD-10-CM | POA: Diagnosis not present

## 2016-11-13 DIAGNOSIS — M25552 Pain in left hip: Secondary | ICD-10-CM | POA: Diagnosis present

## 2016-11-13 DIAGNOSIS — Z87440 Personal history of urinary (tract) infections: Secondary | ICD-10-CM | POA: Insufficient documentation

## 2016-11-13 DIAGNOSIS — E210 Primary hyperparathyroidism: Secondary | ICD-10-CM | POA: Insufficient documentation

## 2016-11-13 DIAGNOSIS — Z885 Allergy status to narcotic agent status: Secondary | ICD-10-CM | POA: Diagnosis not present

## 2016-11-13 DIAGNOSIS — M47816 Spondylosis without myelopathy or radiculopathy, lumbar region: Secondary | ICD-10-CM | POA: Insufficient documentation

## 2016-11-13 DIAGNOSIS — M488X6 Other specified spondylopathies, lumbar region: Secondary | ICD-10-CM | POA: Diagnosis not present

## 2016-11-13 DIAGNOSIS — F32A Depression, unspecified: Secondary | ICD-10-CM

## 2016-11-13 DIAGNOSIS — F17210 Nicotine dependence, cigarettes, uncomplicated: Secondary | ICD-10-CM | POA: Diagnosis not present

## 2016-11-13 DIAGNOSIS — M1288 Other specific arthropathies, not elsewhere classified, other specified site: Secondary | ICD-10-CM

## 2016-11-13 DIAGNOSIS — Z8249 Family history of ischemic heart disease and other diseases of the circulatory system: Secondary | ICD-10-CM | POA: Insufficient documentation

## 2016-11-13 DIAGNOSIS — G894 Chronic pain syndrome: Secondary | ICD-10-CM | POA: Insufficient documentation

## 2016-11-13 DIAGNOSIS — M1611 Unilateral primary osteoarthritis, right hip: Secondary | ICD-10-CM | POA: Insufficient documentation

## 2016-11-13 DIAGNOSIS — M545 Low back pain, unspecified: Secondary | ICD-10-CM

## 2016-11-13 DIAGNOSIS — G4700 Insomnia, unspecified: Secondary | ICD-10-CM | POA: Diagnosis not present

## 2016-11-13 DIAGNOSIS — Z79891 Long term (current) use of opiate analgesic: Secondary | ICD-10-CM | POA: Insufficient documentation

## 2016-11-13 DIAGNOSIS — F1190 Opioid use, unspecified, uncomplicated: Secondary | ICD-10-CM

## 2016-11-13 DIAGNOSIS — Z833 Family history of diabetes mellitus: Secondary | ICD-10-CM | POA: Diagnosis not present

## 2016-11-13 DIAGNOSIS — Z818 Family history of other mental and behavioral disorders: Secondary | ICD-10-CM | POA: Insufficient documentation

## 2016-11-13 DIAGNOSIS — G8929 Other chronic pain: Secondary | ICD-10-CM

## 2016-11-13 DIAGNOSIS — F329 Major depressive disorder, single episode, unspecified: Secondary | ICD-10-CM

## 2016-11-13 DIAGNOSIS — I712 Thoracic aortic aneurysm, without rupture: Secondary | ICD-10-CM | POA: Insufficient documentation

## 2016-11-13 MED ORDER — OXYCODONE HCL 10 MG PO TABS: 10.0000 mg | ORAL_TABLET | Freq: Four times a day (QID) | ORAL | 0 refills | Status: DC | PRN

## 2016-11-13 NOTE — Patient Instructions (Signed)
GENERAL RISKS AND COMPLICATIONS  What are the risk, side effects and possible complications? Generally speaking, most procedures are safe.  However, with any procedure there are risks, side effects, and the possibility of complications.  The risks and complications are dependent upon the sites that are lesioned, or the type of nerve block to be performed.  The closer the procedure is to the spine, the more serious the risks are.  Great care is taken when placing the radio frequency needles, block needles or lesioning probes, but sometimes complications can occur. 1. Infection: Any time there is an injection through the skin, there is a risk of infection.  This is why sterile conditions are used for these blocks.  There are four possible types of infection. 1. Localized skin infection. 2. Central Nervous System Infection-This can be in the form of Meningitis, which can be deadly. 3. Epidural Infections-This can be in the form of an epidural abscess, which can cause pressure inside of the spine, causing compression of the spinal cord with subsequent paralysis. This would require an emergency surgery to decompress, and there are no guarantees that the patient would recover from the paralysis. 4. Discitis-This is an infection of the intervertebral discs.  It occurs in about 1% of discography procedures.  It is difficult to treat and it may lead to surgery.        2. Pain: the needles have to go through skin and soft tissues, will cause soreness.       3. Damage to internal structures:  The nerves to be lesioned may be near blood vessels or    other nerves which can be potentially damaged.       4. Bleeding: Bleeding is more common if the patient is taking blood thinners such as  aspirin, Coumadin, Ticiid, Plavix, etc., or if he/she have some genetic predisposition  such as hemophilia. Bleeding into the spinal canal can cause compression of the spinal  cord with subsequent paralysis.  This would require an  emergency surgery to  decompress and there are no guarantees that the patient would recover from the  paralysis.       5. Pneumothorax:  Puncturing of a lung is a possibility, every time a needle is introduced in  the area of the chest or upper back.  Pneumothorax refers to free air around the  collapsed lung(s), inside of the thoracic cavity (chest cavity).  Another two possible  complications related to a similar event would include: Hemothorax and Chylothorax.   These are variations of the Pneumothorax, where instead of air around the collapsed  lung(s), you may have blood or chyle, respectively.       6. Spinal headaches: They may occur with any procedures in the area of the spine.       7. Persistent CSF (Cerebro-Spinal Fluid) leakage: This is a rare problem, but may occur  with prolonged intrathecal or epidural catheters either due to the formation of a fistulous  track or a dural tear.       8. Nerve damage: By working so close to the spinal cord, there is always a possibility of  nerve damage, which could be as serious as a permanent spinal cord injury with  paralysis.       9. Death:  Although rare, severe deadly allergic reactions known as "Anaphylactic  reaction" can occur to any of the medications used.      10. Worsening of the symptoms:  We can always make thing worse.    What are the chances of something like this happening? Chances of any of this occuring are extremely low.  By statistics, you have more of a chance of getting killed in a motor vehicle accident: while driving to the hospital than any of the above occurring .  Nevertheless, you should be aware that they are possibilities.  In general, it is similar to taking a shower.  Everybody knows that you can slip, hit your head and get killed.  Does that mean that you should not shower again?  Nevertheless always keep in mind that statistics do not mean anything if you happen to be on the wrong side of them.  Even if a procedure has a 1  (one) in a 1,000,000 (million) chance of going wrong, it you happen to be that one..Also, keep in mind that by statistics, you have more of a chance of having something go wrong when taking medications.  Who should not have this procedure? If you are on a blood thinning medication (e.g. Coumadin, Plavix, see list of "Blood Thinners"), or if you have an active infection going on, you should not have the procedure.  If you are taking any blood thinners, please inform your physician.  How should I prepare for this procedure?  Do not eat or drink anything at least six hours prior to the procedure.  Bring a driver with you .  It cannot be a taxi.  Come accompanied by an adult that can drive you back, and that is strong enough to help you if your legs get weak or numb from the local anesthetic.  Take all of your medicines the morning of the procedure with just enough water to swallow them.  If you have diabetes, make sure that you are scheduled to have your procedure done first thing in the morning, whenever possible.  If you have diabetes, take only half of your insulin dose and notify our nurse that you have done so as soon as you arrive at the clinic.  If you are diabetic, but only take blood sugar pills (oral hypoglycemic), then do not take them on the morning of your procedure.  You may take them after you have had the procedure.  Do not take aspirin or any aspirin-containing medications, at least eleven (11) days prior to the procedure.  They may prolong bleeding.  Wear loose fitting clothing that may be easy to take off and that you would not mind if it got stained with Betadine or blood.  Do not wear any jewelry or perfume  Remove any nail coloring.  It will interfere with some of our monitoring equipment.  NOTE: Remember that this is not meant to be interpreted as a complete list of all possible complications.  Unforeseen problems may occur.  BLOOD THINNERS The following drugs  contain aspirin or other products, which can cause increased bleeding during surgery and should not be taken for 2 weeks prior to and 1 week after surgery.  If you should need take something for relief of minor pain, you may take acetaminophen which is found in Tylenol,m Datril, Anacin-3 and Panadol. It is not blood thinner. The products listed below are.  Do not take any of the products listed below in addition to any listed on your instruction sheet.  A.P.C or A.P.C with Codeine Codeine Phosphate Capsules #3 Ibuprofen Ridaura  ABC compound Congesprin Imuran rimadil  Advil Cope Indocin Robaxisal  Alka-Seltzer Effervescent Pain Reliever and Antacid Coricidin or Coricidin-D  Indomethacin Rufen    Alka-Seltzer plus Cold Medicine Cosprin Ketoprofen S-A-C Tablets  Anacin Analgesic Tablets or Capsules Coumadin Korlgesic Salflex  Anacin Extra Strength Analgesic tablets or capsules CP-2 Tablets Lanoril Salicylate  Anaprox Cuprimine Capsules Levenox Salocol  Anexsia-D Dalteparin Magan Salsalate  Anodynos Darvon compound Magnesium Salicylate Sine-off  Ansaid Dasin Capsules Magsal Sodium Salicylate  Anturane Depen Capsules Marnal Soma  APF Arthritis pain formula Dewitt's Pills Measurin Stanback  Argesic Dia-Gesic Meclofenamic Sulfinpyrazone  Arthritis Bayer Timed Release Aspirin Diclofenac Meclomen Sulindac  Arthritis pain formula Anacin Dicumarol Medipren Supac  Analgesic (Safety coated) Arthralgen Diffunasal Mefanamic Suprofen  Arthritis Strength Bufferin Dihydrocodeine Mepro Compound Suprol  Arthropan liquid Dopirydamole Methcarbomol with Aspirin Synalgos  ASA tablets/Enseals Disalcid Micrainin Tagament  Ascriptin Doan's Midol Talwin  Ascriptin A/D Dolene Mobidin Tanderil  Ascriptin Extra Strength Dolobid Moblgesic Ticlid  Ascriptin with Codeine Doloprin or Doloprin with Codeine Momentum Tolectin  Asperbuf Duoprin Mono-gesic Trendar  Aspergum Duradyne Motrin or Motrin IB Triminicin  Aspirin  plain, buffered or enteric coated Durasal Myochrisine Trigesic  Aspirin Suppositories Easprin Nalfon Trillsate  Aspirin with Codeine Ecotrin Regular or Extra Strength Naprosyn Uracel  Atromid-S Efficin Naproxen Ursinus  Auranofin Capsules Elmiron Neocylate Vanquish  Axotal Emagrin Norgesic Verin  Azathioprine Empirin or Empirin with Codeine Normiflo Vitamin E  Azolid Emprazil Nuprin Voltaren  Bayer Aspirin plain, buffered or children's or timed BC Tablets or powders Encaprin Orgaran Warfarin Sodium  Buff-a-Comp Enoxaparin Orudis Zorpin  Buff-a-Comp with Codeine Equegesic Os-Cal-Gesic   Buffaprin Excedrin plain, buffered or Extra Strength Oxalid   Bufferin Arthritis Strength Feldene Oxphenbutazone   Bufferin plain or Extra Strength Feldene Capsules Oxycodone with Aspirin   Bufferin with Codeine Fenoprofen Fenoprofen Pabalate or Pabalate-SF   Buffets II Flogesic Panagesic   Buffinol plain or Extra Strength Florinal or Florinal with Codeine Panwarfarin   Buf-Tabs Flurbiprofen Penicillamine   Butalbital Compound Four-way cold tablets Penicillin   Butazolidin Fragmin Pepto-Bismol   Carbenicillin Geminisyn Percodan   Carna Arthritis Reliever Geopen Persantine   Carprofen Gold's salt Persistin   Chloramphenicol Goody's Phenylbutazone   Chloromycetin Haltrain Piroxlcam   Clmetidine heparin Plaquenil   Cllnoril Hyco-pap Ponstel   Clofibrate Hydroxy chloroquine Propoxyphen         Before stopping any of these medications, be sure to consult the physician who ordered them.  Some, such as Coumadin (Warfarin) are ordered to prevent or treat serious conditions such as "deep thrombosis", "pumonary embolisms", and other heart problems.  The amount of time that you may need off of the medication may also vary with the medication and the reason for which you were taking it.  If you are taking any of these medications, please make sure you notify your pain physician before you undergo any  procedures.         Facet Joint Block The facet joints connect the bones of the spine (vertebrae). They make it possible for you to bend, twist, and make other movements with your spine. They also prevent you from overbending, overtwisting, and making other excessive movements.  A facet joint block is a procedure where a numbing medicine (anesthetic) is injected into a facet joint. Often, a type of anti-inflammatory medicine called a steroid is also injected. A facet joint block may be done for two reasons:   Diagnosis. A facet joint block may be done as a test to see whether neck or back pain is caused by a worn-down or infected facet joint. If the pain gets better after a facet joint block, it means the   pain is probably coming from the facet joint. If the pain does not get better, it means the pain is probably not coming from the facet joint.   Therapy. A facet joint block may be done to relieve neck or back pain caused by a facet joint. A facet joint block is only done as a therapy if the pain does not improve with medicine, exercise programs, physical therapy, and other forms of pain management. LET YOUR HEALTH CARE PROVIDER KNOW ABOUT:   Any allergies you have.   All medicines you are taking, including vitamins, herbs, eyedrops, and over-the-counter medicines and creams.   Previous problems you or members of your family have had with the use of anesthetics.   Any blood disorders you have had.   Other health problems you have. RISKS AND COMPLICATIONS Generally, having a facet joint block is safe. However, as with any procedure, complications can occur. Possible complications associated with having a facet joint block include:   Bleeding.   Injury to a nerve near the injection site.   Pain at the injection site.   Weakness or numbness in areas controlled by nerves near the injection site.   Infection.   Temporary fluid retention.   Allergic reaction to  anesthetics or medicines used during the procedure. BEFORE THE PROCEDURE   Follow your health care provider's instructions if you are taking dietary supplements or medicines. You may need to stop taking them or reduce your dosage.   Do not take any new dietary supplements or medicines without asking your health care provider first.   Follow your health care provider's instructions about eating and drinking before the procedure. You may need to stop eating and drinking several hours before the procedure.   Arrange to have an adult drive you home after the procedure. PROCEDURE  You may need to remove your clothing and dress in an open-back gown so that your health care provider can access your spine.   The procedure will be done while you are lying on an X-ray table. Most of the time you will be asked to lie on your stomach, but you may be asked to lie in a different position if an injection will be made in your neck.   Special machines will be used to monitor your oxygen levels, heart rate, and blood pressure.   If an injection will be made in your neck, an intravenous (IV) tube will be inserted into one of your veins. Fluids and medicine will flow directly into your body through the IV tube.   The area over the facet joint where the injection will be made will be cleaned with an antiseptic soap. The surrounding skin will be covered with sterile drapes.   An anesthetic will be applied to your skin to make the injection area numb. You may feel a temporary stinging or burning sensation.   A video X-ray machine will be used to locate the joint. A contrast dye may be injected into the facet joint area to help with locating the joint.   When the joint is located, an anesthetic medicine will be injected into the joint through the needle.   Your health care provider will ask you whether you feel pain relief. If you do feel relief, a steroid may be injected to provide pain relief for a  longer period of time. If you do not feel relief or feel only partial relief, additional injections of an anesthetic may be made in other facet joints.     The needle will be removed, the skin will be cleansed, and bandages will be applied.  AFTER THE PROCEDURE   You will be observed for 15-30 minutes before being allowed to go home. Do not drive. Have an adult drive you or take a taxi or public transportation instead.   If you feel pain relief, the pain will return in several hours or days when the anesthetic wears off.   You may feel pain relief 2-14 days after the procedure. The amount of time this relief lasts varies from person to person.   It is normal to feel some tenderness over the injected area(s) for 2 days following the procedure.   If you have diabetes, you may have a temporary increase in blood sugar. This information is not intended to replace advice given to you by your health care provider. Make sure you discuss any questions you have with your health care provider. Document Released: 03/14/2007 Document Revised: 11/13/2014 Document Reviewed: 07/19/2015 Elsevier Interactive Patient Education  2017 [employer]  

## 2016-11-13 NOTE — Progress Notes (Signed)
Nursing Pain Medication Assessment:  Safety precautions to be maintained throughout the outpatient stay will include: orient to surroundings, keep bed in low position, maintain call bell within reach at all times, provide assistance with transfer out of bed and ambulation.  Medication Inspection Compliance: Pill count conducted under aseptic conditions, in front of the patient. Neither the pills nor the bottle was removed from the patient's sight at any time. Once count was completed pills were immediately returned to the patient in their original bottle.  Medication: Oxycodone IR Pill Count: 23 of 120 pills remain Bottle Appearance: Standard pharmacy container. Clearly labeled. Filled Date: 12 /16 2017 Medication last intake 11-13-16

## 2016-11-13 NOTE — Progress Notes (Signed)
Patient's Name: Brittany Mays  MRN: [id number]  Referring Provider: [name], MD  DOB: 1941/04/03  PCP: [name], MD  DOS: 11/13/2016  Note by: Kathlen Brunswick. Dossie Arbour, MD  Service setting: Ambulatory outpatient  Specialty: Interventional Pain Management  Location: [facility] (AMB) Pain Management Facility    Patient type: Established   Primary Reason(s) for Visit: Encounter for prescription drug management & post-procedure evaluation of chronic illness with mild to moderate exacerbation(Level of risk: moderate) CC: Hip Pain (left) and Back Pain (lower)  HPI  Brittany Mays is a 76 y.o. year old, female patient, who comes today for a post-procedure evaluation and medication management. She has Anxiety and depression; Obesity (BMI 30-39.9); Lumbar spinal stenosis; History of lumbar fusion (T10 through L3 fusion); Generalized weakness; Mechanical complication of internal fixation device such as nail, plate or rod ([place]); Pseudarthrosis after fusion or arthrodesis; Solitary pulmonary nodule; Chronic hip pain (Location of Secondary source of pain) (Left); Hyperlipidemia; Aortic aneurysm ([place]); Osteoarthritis, multiple sites; Chronic low back pain (Location of Primary Source of Pain) (Bilateral) (R>L); Failed back surgical syndrome (x4); Chronic knee pain (Bilateral) (L>R); Long term current use of opiate analgesic; Long term prescription opiate use; Opiate use (60 MME/Day); Encounter for pain management planning; Encounter for therapeutic drug level monitoring; Abnormal CT scan, lumbar spine (05/07/2015); Osteoarthritis of hip  (Bilateral) (L>R); Osteoarthritis of sacroiliac joint (Bilateral); Lumbar facet arthropathy; Lumbar spondylolisthesis; Lumbar foraminal stenosis (Bilateral); History of total bilateral knee replacement; Opioid-induced constipation ([facility]); Chronic groin pain (Location of Tertiary source of pain) (Left); Postlaminectomy syndrome of lumbar region; Lumbar facet syndrome (Bilateral)  (R>L); Depression; Pseudarthrosis following spinal fusion; Lumbar spondylosis; Chronic upper back pain (midline); and Chronic pain syndrome on her problem list. Her primarily concern today is the Hip Pain (left) and Back Pain (lower)  Pain Assessment: Self-Reported Pain Score: 4 /10             Reported level is compatible with observation.       Pain Type: Chronic pain Pain Location: Hip Pain Orientation: Left, Other (Comment) Pain Descriptors / Indicators: Sharp Pain Frequency: Intermittent  Brittany Mays was last seen on 10/09/2016 for a procedure. During today's appointment we reviewed Brittany Mays's post-procedure results, as well as her outpatient medication regimen. Recent left intra-articular hip joint injection provided her with 100% relief of the hip pain for the duration of local anesthetic. Prior to that, a diagnostic bilateral L1, L2, and L3 medial branch block under fluoroscopic guidance and IV sedation had also provided her with 100% relief of the low back pain. She seems to be very depressed today and therefore will be getting a referral to a psychiatrist and a psychologist for evaluation and treatment of her secondary depression. She read he takes antidepressants and therefore I'm hoping that this psychiatrist can evaluate her medication regiment and determine if there is something better that she can use. The referral for the medical psychologist is so that they can apply nonpharmacological techniques to address her depression. I do realize that the depression is possibly secondary to her chronic pain and therefore we will be working towards hopefully improving that part. At this point she had excellent results with the bilateral lumbar facet block and therefore I will be repeating that with the goal of eventually doing a radiofrequency ablation should she get similar short-term benefits. The idea of the radiofrequency is to extend on those results. The patient has already tried all other  alternatives and at age 57 she is  really not an excellent candidate for physical therapy to help with her facet joint problems.  Further details on both, my assessment(s), as well as the proposed treatment plan, please see below.  Controlled Substance Pharmacotherapy Assessment REMS (Risk Evaluation and Mitigation Strategy)  Analgesic:Oxycodone IR 10 mg every 6 hours (40 mg/dayof oxycodone) MME/day:48m/day DLandis Martins RN  11/13/2016 12:55 PM  Sign at close encounter Nursing Pain Medication Assessment:  Safety precautions to be maintained throughout the outpatient stay will include: orient to surroundings, keep bed in low position, maintain call bell within reach at all times, provide assistance with transfer out of bed and ambulation.  Medication Inspection Compliance: Pill count conducted under aseptic conditions, in front of the patient. Neither the pills nor the bottle was removed from the patient's sight at any time. Once count was completed pills were immediately returned to the patient in their original bottle.  Medication: Oxycodone IR Pill Count: 23 of 120 pills remain Bottle Appearance: Standard pharmacy container. Clearly labeled. Filled Date: 12 /16 2017 Medication last intake 11-13-16   Pharmacokinetics: Liberation and absorption (onset of action): WNL Distribution (time to peak effect): WNL Metabolism and excretion (duration of action): WNL         Pharmacodynamics: Desired effects: Analgesia: Ms. RSweigertreports >50% benefit. Functional ability: Patient reports that medication allows her to accomplish basic ADLs Clinically meaningful improvement in function (CMIF): Sustained CMIF goals met Perceived effectiveness: Described as relatively effective, allowing for increase in activities of daily living (ADL) Undesirable effects: Side-effects or Adverse reactions: None reported Monitoring:  PMP: Online review of the past 165-montheriod conducted. Compliant with  practice rules and regulations List of all UDS test(s) done:  Lab Results  Component Value Date   SUMMARY FINAL 07/12/2016   Last UDS on record: No results found for: TOXASSSELUR UDS interpretation: Compliant          Medication Assessment Form: Reviewed. Patient indicates being compliant with therapy Treatment compliance: Compliant Risk Assessment Profile: Aberrant behavior: See prior evaluations. None observed or detected today Comorbid factors increasing risk of overdose: See prior notes. No additional risks detected today Risk of substance use disorder (SUD): Low Opioid Risk Tool (ORT) Total Score: 4  Interpretation Table:  Score <3 = Low Risk for SUD  Score between 4-7 = Moderate Risk for SUD  Score >8 = High Risk for Opioid Abuse   Risk Mitigation Strategies:  Patient Counseling: Covered Patient-Prescriber Agreement (PPA): Present and active  Notification to other healthcare providers: Done  Pharmacologic Plan: No change in therapy, at this time  Post-Procedure Assessment  10/09/2016 Procedure: Diagnostic left intra-articular hip joint injection under fluoroscopic guidance and IV sedation Post-procedure pain score: 0/10 (100% relief) Influential Factors: BMI: 24.61 kg/m Intra-procedural challenges: None observed Assessment challenges: None detected         Post-procedural side-effects, adverse reactions, or complications: None reported Reported issues: None  Sedation: Sedation provided. When no sedatives are used, the analgesic levels obtained are directly associated to the effectiveness of the local anesthetics. However, when sedation is provided, the level of analgesia obtained during the initial 1 hour following the intervention, is believed to be the result of a combination of factors. These factors may include, but are not limited to: 1. The effectiveness of the local anesthetics used. 2. The effects of the analgesic(s) and/or anxiolytic(s) used. 3. The degree of  discomfort experienced by the patient at the time of the procedure. 4. The patients ability and reliability in recalling and recording  the events. 5. The presence and influence of possible secondary gains and/or psychosocial factors. Reported result: Relief experienced during the 1st hour after the procedure: 100 % (Ultra-Short Term Relief) Interpretative annotation: Analgesia during this period is likely to be Local Anesthetic and/or IV Sedative (Analgesic/Anxiolitic) related.          Effects of local anesthetic: The analgesic effects attained during this period are directly associated to the localized infiltration of local anesthetics and therefore cary significant diagnostic value as to the etiological location, or anatomical origin, of the pain. Expected duration of relief is directly dependent on the pharmacodynamics of the local anesthetic used. Long-acting (4-6 hours) anesthetics used.  Reported result: Relief during the next 4 to 6 hour after the procedure: 100 % (Short-Term Relief) Interpretative annotation: Complete relief would suggest area to be the source of the pain.          Long-term benefit: Defined as the period of time past the expected duration of local anesthetics. With the possible exception of prolonged sympathetic blockade from the local anesthetics, benefits during this period are typically attributed to, or associated with, other factors such as analgesic sensory neuropraxia, antiinflammatory effects, or beneficial biochemical changes provided by agents other than the local anesthetics Reported result: Extended relief following procedure: 0 % (Long-Term Relief) Interpretative annotation: No long-term benefit. This could suggest limited inflammatory component to the pain with possible mechanical aggravating factors.          Current benefits: Defined as persistent relief that continues at this point in time.   Reported results: Treated area: <25 %       Interpretative  annotation: Recurrance of symptoms. This would suggest persistent aggravating factors  Interpretation: Results would suggest a successful diagnostic intervention. We'll proceed with the next treatment, as soon as convenient  Laboratory Chemistry  Inflammation Markers Lab Results  Component Value Date   ESRSEDRATE 6 07/12/2016   CRP 0.6 07/12/2016   Renal Function Lab Results  Component Value Date   BUN 14 07/12/2016   CREATININE 0.80 10/12/2016   GFRAA >60 07/12/2016   GFRNONAA >60 07/12/2016   Hepatic Function Lab Results  Component Value Date   AST 25 07/12/2016   ALT 18 07/12/2016   ALBUMIN 4.3 07/12/2016   Electrolytes Lab Results  Component Value Date   NA 140 07/12/2016   K 4.1 07/12/2016   CL 102 07/12/2016   CALCIUM 9.1 07/12/2016   MG 2.0 07/12/2016   Pain Modulating Vitamins Lab Results  Component Value Date   25OHVITD1 35 07/12/2016   25OHVITD2 <1.0 07/12/2016   25OHVITD3 35 07/12/2016   VITAMINB12 1,782 (H) 07/12/2016   Coagulation Parameters Lab Results  Component Value Date   PLT 168.0 09/07/2015   Cardiovascular Lab Results  Component Value Date   HGB 14.4 09/07/2015   HCT 44.0 09/07/2015   Note: Lab results reviewed.  Recent Diagnostic Imaging Review  Ct Chest W Contrast  Result Date: 10/12/2016 CLINICAL DATA:  Lung nodule.  Thoracic aortic aneurysm.  Smoker. EXAM: CT CHEST WITH CONTRAST TECHNIQUE: Multidetector CT imaging of the chest was performed during intravenous contrast administration. CONTRAST:  36m ISOVUE-300 IOPAMIDOL (ISOVUE-300) INJECTION 61% COMPARISON:  04/14/2016 FINDINGS: Cardiovascular: Thoracic aorta measures 3.9 cm in the proximal ascending aorta, 3.7 cm in the mid ascending aorta, 2.7 cm in the anterior arch, 2.6 cm in the descending arch, 2.3 cm in the mid descending thoracic aorta, and 2.1 cm at the hiatus. No dissection. Atherosclerotic calcification of the aortic arch  and branch vessels noted. Mediastinum/Nodes: Right  lower paratracheal node 7 mm in short axis on image 61/4. Right hilar node 8 mm in short axis, image 69 series 4. No pathologic adenopathy identified. Lungs/Pleura: Stable 2 mm nodule right upper lobe image 53/5. Stable linear 0.8 by 0.3 cm structure in the right upper lobe on image 52/5, probably a plugged airway based on configuration. Both of these are unchanged from 07/09/2013. Small eventration of the right posterior hemidiaphragm. 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 Upper Abdomen: Unremarkable Musculoskeletal: Degenerative glenohumeral arthropathy, right greater than left. Posterolateral rod and pedicle screw fixation in the lower thoracic spine. Mild pectus carinatum. IMPRESSION: 1. The right upper lobe pulmonary nodules are stable from 07/09/2013 and considered benign. No further workup warranted. 2. Stable 3.9 cm in maximum diameter ascending aortic aneurysm. Atherosclerotic aortic arch. Annual follow up likely warranted. 3. Right greater than left degenerative glenohumeral arthropathy. Electronically Signed   By: Van Clines M.D.   On: 10/12/2016 15:06   Note: Imaging results reviewed.          Meds  The patient has a current medication list which includes the following prescription(s): vitamin c, duloxetine, fish oil-omega-3 fatty acids, gabapentin, glucosamine-chondroitin, lubiprostone, melatonin, womens multi, naloxone, oxycodone hcl, oxycodone hcl, oxycodone hcl, polyethylene glycol 3350, red yeast rice, senna laxative, vitamin b-12, and benefiber.  Current Outpatient Prescriptions on File Prior to Visit  Medication Sig  . Ascorbic Acid (VITAMIN C) 500 MG CAPS Take by mouth daily.  . DULoxetine (CYMBALTA) 60 MG capsule Take 1 capsule (60 mg total) by mouth daily.  . fish oil-omega-3 fatty acids 1000 MG capsule Take 1 g by mouth daily.   Marland Kitchen gabapentin (NEURONTIN) 600 MG tablet 2 (two) times daily. 300 mg in a.m., 600 mg at night  . glucosamine-chondroitin 500-400  MG tablet Take 2 tablets by mouth daily.  Marland Kitchen lubiprostone (AMITIZA) 8 MCG capsule Take 1 capsule (8 mcg total) by mouth 2 (two) times daily with a meal. Swallow the medication whole. Do not break or chew the medication.  . Melatonin 3 MG CAPS Take 1 capsule (3 mg total) by mouth at bedtime as needed (insomnia). (Patient taking differently: Take 5 mg by mouth at bedtime as needed (insomnia). )  . Multiple Vitamins-Minerals (WOMENS MULTI) CAPS Take one tablet once daily  . naloxone [facility]) 2 MG/2ML injection Inject content of syringe into thigh muscle. Call 911.  . Polyethylene Glycol 3350 GRAN   . Red Yeast Rice 600 MG CAPS Take 2 capsules (1,200 mg total) by mouth daily.  . SENNA LAXATIVE 25 MG TABS as needed.   . vitamin B-12 (CYANOCOBALAMIN) 100 MCG tablet Take 1,000 mcg by mouth every other day.   . Wheat Dextrin (BENEFIBER) POWD Stir 2 tsp. TID into 4-8 oz of any non-carbonated beverage or soft food (hot or cold)   No current facility-administered medications on file prior to visit.    ROS  Constitutional: Denies any fever or chills Gastrointestinal: No reported hemesis, hematochezia, vomiting, or acute GI distress Musculoskeletal: Denies any acute onset joint swelling, redness, loss of ROM, or weakness Neurological: No reported episodes of acute onset apraxia, aphasia, dysarthria, agnosia, amnesia, paralysis, loss of coordination, or loss of consciousness  Allergies  Ms. Brigandi is allergic to nsaids; pentothal [thiopental]; and fentanyl.  PFSH  Drug: Ms. Round  reports that she does not use drugs. Alcohol:  reports that she does not drink alcohol. Tobacco:  reports that she has been smoking Cigarettes.  She has a 25.00 pack-year smoking history. She has never used smokeless tobacco. Medical:  has a past medical history of Anxiety; Aortic aneurysm ([place]) (06/06/2016); Cataract; Chest pain (08/18/2015); Decreased muscle strength (02/10/2014); Depression; Difficulty in walking(719.7)  (02/10/2014); Dysphagia (08/19/2015); Dyspnea (08/19/2015); Hyperparathyroidism, primary ([place]); Hyperventilation (08/18/2015); Insomnia; Intractable back pain (04/29/2014); Kidney stone; Lumbar spinal stenosis; Odynophagia (08/18/2015); Osteoarthritis of both knees; Pain; Rectal prolapse; Unintentional weight loss (09/08/2015); and UTI (lower urinary tract infection) (04/28/2014). Family: family history includes COPD in her father; Diabetes in her son; Heart failure in her father; Mental illness in her mother.  Past Surgical History:  Procedure Laterality Date  . ABDOMINAL HYSTERECTOMY    . APPENDECTOMY    . BACK SURGERY  07-2015   major surgery  . BREAST LUMPECTOMY     Reports this is benign  . colonoscopy  2013  . KNEE SURGERY Bilateral 1990's nd 2002   Knee Replacements  . PARATHYROIDECTOMY  1990's  . SPINE SURGERY    . TONSILLECTOMY     Constitutional Exam  General appearance: Well nourished, well developed, and well hydrated. In no apparent acute distress Vitals:   11/13/16 1248  BP: 134/73  Pulse: 100  Resp: 18  Temp: 97.8 F (36.6 C)  TempSrc: Oral  SpO2: 98%  Weight: 126 lb (57.2 kg)  Height: 5' (1.524 m)   BMI Assessment: Estimated body mass index is 24.61 kg/m as calculated from the following:   Height as of this encounter: 5' (1.524 m).   Weight as of this encounter: 126 lb (57.2 kg).  BMI interpretation table: BMI level Category Range association with higher incidence of chronic pain  <18 kg/m2 Underweight   18.5-24.9 kg/m2 Ideal body weight   25-29.9 kg/m2 Overweight Increased incidence by 20%  30-34.9 kg/m2 Obese (Class I) Increased incidence by 68%  35-39.9 kg/m2 Severe obesity (Class II) Increased incidence by 136%  >40 kg/m2 Extreme obesity (Class III) Increased incidence by 254%   BMI Readings from Last 4 Encounters:  11/13/16 24.61 kg/m  10/17/16 25.43 kg/m  10/09/16 24.61 kg/m  09/13/16 24.19 kg/m   Wt Readings from Last 4 Encounters:  11/13/16  126 lb (57.2 kg)  10/17/16 130 lb 3.2 oz (59.1 kg)  10/09/16 126 lb (57.2 kg)  09/13/16 128 lb (58.1 kg)  Psych/Mental status: Alert, oriented x 3 (person, place, & time) Eyes: PERLA Respiratory: No evidence of acute respiratory distress  Cervical Spine Exam  Inspection: No masses, redness, or swelling Alignment: Symmetrical Functional ROM: Unrestricted ROM Stability: No instability detected Muscle strength & Tone: Functionally intact Sensory: Unimpaired Palpation: Non-contributory  Upper Extremity (UE) Exam    Side: Right upper extremity  Side: Left upper extremity  Inspection: No masses, redness, swelling, or asymmetry  Inspection: No masses, redness, swelling, or asymmetry  Functional ROM: Unrestricted ROM          Functional ROM: Unrestricted ROM          Muscle strength & Tone: Functionally intact  Muscle strength & Tone: Functionally intact  Sensory: Unimpaired  Sensory: Unimpaired  Palpation: Non-contributory  Palpation: Non-contributory   Thoracic Spine Exam  Inspection: No masses, redness, or swelling Alignment: Symmetrical Functional ROM: Unrestricted ROM Stability: No instability detected Sensory: Unimpaired Muscle strength & Tone: Functionally intact Palpation: Non-contributory  Lumbar Spine Exam  Inspection: No masses, redness, or swelling Alignment: Symmetrical Functional ROM: Decreased ROM Stability: No instability detected Muscle strength & Tone: Functionally intact Sensory: Movement-associated pain Palpation: Complains of area being tender to palpation  Provocative Tests: Lumbar Hyperextension and rotation test: Positive bilaterally for facet joint pain. Patrick's Maneuver: evaluation deferred today              Gait & Posture Assessment  Ambulation: Unassisted Gait: Relatively normal for age and body habitus Posture: WNL   Lower Extremity Exam    Side: Right lower extremity  Side: Left lower extremity  Inspection: No masses, redness, swelling, or  asymmetry  Inspection: No masses, redness, swelling, or asymmetry  Functional ROM: Unrestricted ROM          Functional ROM: Unrestricted ROM          Muscle strength & Tone: Functionally intact  Muscle strength & Tone: Functionally intact  Sensory: Unimpaired  Sensory: Unimpaired  Palpation: Non-contributory  Palpation: Non-contributory   Assessment  Primary Diagnosis & Pertinent Problem List: The primary encounter diagnosis was Chronic pain syndrome. Diagnoses of Chronic low back pain (Location of Primary Source of Pain) (Bilateral) (R>L), Chronic hip pain (Location of Secondary source of pain) (Left), Chronic groin pain (Location of Tertiary source of pain) (Left), Long term current use of opiate analgesic, Opiate use (60 MME/Day), Lumbar facet syndrome (Bilateral) (R>L), Lumbar spondylosis, and Depression, unspecified depression type were also pertinent to this visit.  Status Diagnosis  Stable Unimproved Unimproved 1. Chronic pain syndrome   2. Chronic low back pain (Location of Primary Source of Pain) (Bilateral) (R>L)   3. Chronic hip pain (Location of Secondary source of pain) (Left)   4. Chronic groin pain (Location of Tertiary source of pain) (Left)   5. Long term current use of opiate analgesic   6. Opiate use (60 MME/Day)   7. Lumbar facet syndrome (Bilateral) (R>L)   8. Lumbar spondylosis   9. Depression, unspecified depression type      Plan of Care  Pharmacotherapy (Medications Ordered): Meds ordered this encounter  Medications  . Oxycodone HCl 10 MG TABS    Sig: Take 1 tablet (10 mg total) by mouth every 6 (six) hours as needed.    Dispense:  120 tablet    Refill:  0    Patient may have prescription filled one day early if pharmacy is closed on scheduled refill date. Do not fill until: 12/20/16 To last until: 01/19/17  . Oxycodone HCl 10 MG TABS    Sig: Take 1 tablet (10 mg total) by mouth every 6 (six) hours as needed.    Dispense:  120 tablet    Refill:  0     Patient may have prescription filled one day early if pharmacy is closed on scheduled refill date. Do not fill until: 01/19/17 To last until: 02/18/17  . Oxycodone HCl 10 MG TABS    Sig: Take 1 tablet (10 mg total) by mouth every 6 (six) hours as needed.    Dispense:  120 tablet    Refill:  0    Patient may have prescription filled one day early if pharmacy is closed on scheduled refill date. Do not fill until: 11/20/16 To last until: 12/20/16   New Prescriptions   No medications on file   Medications administered today: Ms. Mullinax had no medications administered during this visit. Lab-work, procedure(s), and/or referral(s): Orders Placed This Encounter  Procedures  . LUMBAR FACET(MEDIAL BRANCH NERVE BLOCK) MBNB  . Ambulatory referral to Psychiatry  . Ambulatory referral to Psychology   Imaging and/or referral(s): AMB REFERRAL TO PSYCHIATRY AMB REFERRAL TO PSYCHOLOGY  Interventional therapies: Planned, scheduled, and/or pending:   Diagnostic Bilateral Medial  Branch Facet Block #2   Considering:   Diagnostic bilateral lumbar facet block #2 under fluoro and sedation. Diagnostic bilateral thoracolumbar facet block. Possible bilateral thoracolumbar facet radiofrequency ablation.  Diagnostic left intra-articular hip joint injection. Diagnostic left femoral and obturator nerve articular branch blocks.  Possible left femoral and obturator nerve articular branch radiofrequency ablation.  Diagnostic bilateral genicular nerve blocks.  Possible bilateral genicular nerve radiofrequency ablation.  Diagnostic intrathecal pump trial.    Palliative PRN treatment(s):   Not at this time.   Provider-requested follow-up: Return in about 3 months (around 02/11/2017) for (NP) Med-Mgmt, in addition, procedure (ASAP).  Future Appointments Date Time Provider [place]  12/07/2016 10:00 AM [name], MD [facility] None   Primary Care Physician: [name], MD Location:  [facility] Outpatient Pain Management Facility Note by: Kathlen Brunswick. Dossie Arbour, M.D, DABA, DABAPM, DABPM, DABIPP, FIPP Date: 11/13/16; Time: 2:06 PM  Pain Score Disclaimer: We use the NRS-11 scale. This is a self-reported, subjective measurement of pain severity with only modest accuracy. It is used primarily to identify changes within a particular patient. It must be understood that outpatient pain scales are significantly less accurate that those used for research, where they can be applied under ideal controlled circumstances with minimal exposure to variables. In reality, the score is likely to be a combination of pain intensity and pain affect, where pain affect describes the degree of emotional arousal or changes in action readiness caused by the sensory experience of pain. Factors such as social and work situation, setting, emotional state, anxiety levels, expectation, and prior pain experience may influence pain perception and show large inter-individual differences that may also be affected by time variables.  Patient instructions provided during this appointment: Patient Instructions   GENERAL RISKS AND COMPLICATIONS  What are the risk, side effects and possible complications? Generally speaking, most procedures are safe.  However, with any procedure there are risks, side effects, and the possibility of complications.  The risks and complications are dependent upon the sites that are lesioned, or the type of nerve block to be performed.  The closer the procedure is to the spine, the more serious the risks are.  Great care is taken when placing the radio frequency needles, block needles or lesioning probes, but sometimes complications can occur. 1. Infection: Any time there is an injection through the skin, there is a risk of infection.  This is why sterile conditions are used for these blocks.  There are four possible types of infection. 1. Localized skin infection. 2. Central Nervous System  Infection-This can be in the form of Meningitis, which can be deadly. 3. Epidural Infections-This can be in the form of an epidural abscess, which can cause pressure inside of the spine, causing compression of the spinal cord with subsequent paralysis. This would require an emergency surgery to decompress, and there are no guarantees that the patient would recover from the paralysis. 4. Discitis-This is an infection of the intervertebral discs.  It occurs in about 1% of discography procedures.  It is difficult to treat and it may lead to surgery.        2. Pain: the needles have to go through skin and soft tissues, will cause soreness.       3. Damage to internal structures:  The nerves to be lesioned may be near blood vessels or    other nerves which can be potentially damaged.       4. Bleeding: Bleeding is more common if the  patient is taking blood thinners such as  aspirin, Coumadin, Ticiid, Plavix, etc., or if he/she have some genetic predisposition  such as hemophilia. Bleeding into the spinal canal can cause compression of the spinal  cord with subsequent paralysis.  This would require an emergency surgery to  decompress and there are no guarantees that the patient would recover from the  paralysis.       5. Pneumothorax:  Puncturing of a lung is a possibility, every time a needle is introduced in  the area of the chest or upper back.  Pneumothorax refers to free air around the  collapsed lung(s), inside of the thoracic cavity (chest cavity).  Another two possible  complications related to a similar event would include: Hemothorax and Chylothorax.   These are variations of the Pneumothorax, where instead of air around the collapsed  lung(s), you may have blood or chyle, respectively.       6. Spinal headaches: They may occur with any procedures in the area of the spine.       7. Persistent CSF (Cerebro-Spinal Fluid) leakage: This is a rare problem, but may occur  with prolonged intrathecal or  epidural catheters either due to the formation of a fistulous  track or a dural tear.       8. Nerve damage: By working so close to the spinal cord, there is always a possibility of  nerve damage, which could be as serious as a permanent spinal cord injury with  paralysis.       9. Death:  Although rare, severe deadly allergic reactions known as "Anaphylactic  reaction" can occur to any of the medications used.      10. Worsening of the symptoms:  We can always make thing worse.  What are the chances of something like this happening? Chances of any of this occuring are extremely low.  By statistics, you have more of a chance of getting killed in a motor vehicle accident: while driving to the hospital than any of the above occurring .  Nevertheless, you should be aware that they are possibilities.  In general, it is similar to taking a shower.  Everybody knows that you can slip, hit your head and get killed.  Does that mean that you should not shower again?  Nevertheless always keep in mind that statistics do not mean anything if you happen to be on the wrong side of them.  Even if a procedure has a 1 (one) in a 1,000,000 (million) chance of going wrong, it you happen to be that one..Also, keep in mind that by statistics, you have more of a chance of having something go wrong when taking medications.  Who should not have this procedure? If you are on a blood thinning medication (e.g. Coumadin, Plavix, see list of "Blood Thinners"), or if you have an active infection going on, you should not have the procedure.  If you are taking any blood thinners, please inform your physician.  How should I prepare for this procedure?  Do not eat or drink anything at least six hours prior to the procedure.  Bring a driver with you .  It cannot be a taxi.  Come accompanied by an adult that can drive you back, and that is strong enough to help you if your legs get weak or numb from the local anesthetic.  Take all of  your medicines the morning of the procedure with just enough water to swallow them.  If you have diabetes,  make sure that you are scheduled to have your procedure done first thing in the morning, whenever possible.  If you have diabetes, take only half of your insulin dose and notify our nurse that you have done so as soon as you arrive at the clinic.  If you are diabetic, but only take blood sugar pills (oral hypoglycemic), then do not take them on the morning of your procedure.  You may take them after you have had the procedure.  Do not take aspirin or any aspirin-containing medications, at least eleven (11) days prior to the procedure.  They may prolong bleeding.  Wear loose fitting clothing that may be easy to take off and that you would not mind if it got stained with Betadine or blood.  Do not wear any jewelry or perfume  Remove any nail coloring.  It will interfere with some of our monitoring equipment.  NOTE: Remember that this is not meant to be interpreted as a complete list of all possible complications.  Unforeseen problems may occur.  BLOOD THINNERS The following drugs contain aspirin or other products, which can cause increased bleeding during surgery and should not be taken for 2 weeks prior to and 1 week after surgery.  If you should need take something for relief of minor pain, you may take acetaminophen which is found in Tylenol,m Datril, Anacin-3 and Panadol. It is not blood thinner. The products listed below are.  Do not take any of the products listed below in addition to any listed on your instruction sheet.  A.P.C or A.P.C with Codeine Codeine Phosphate Capsules #3 Ibuprofen Ridaura  ABC compound Congesprin Imuran rimadil  Advil Cope Indocin Robaxisal  Alka-Seltzer Effervescent Pain Reliever and Antacid Coricidin or Coricidin-D  Indomethacin Rufen  Alka-Seltzer plus Cold Medicine Cosprin Ketoprofen S-A-C Tablets  Anacin Analgesic Tablets or Capsules Coumadin  Korlgesic Salflex  Anacin Extra Strength Analgesic tablets or capsules CP-2 Tablets Lanoril Salicylate  Anaprox Cuprimine Capsules Levenox Salocol  Anexsia-D Dalteparin Magan Salsalate  Anodynos Darvon compound Magnesium Salicylate Sine-off  Ansaid Dasin Capsules Magsal Sodium Salicylate  Anturane Depen Capsules Marnal Soma  APF Arthritis pain formula Dewitt's Pills Measurin Stanback  Argesic Dia-Gesic Meclofenamic Sulfinpyrazone  Arthritis Bayer Timed Release Aspirin Diclofenac Meclomen Sulindac  Arthritis pain formula Anacin Dicumarol Medipren Supac  Analgesic (Safety coated) Arthralgen Diffunasal Mefanamic Suprofen  Arthritis Strength Bufferin Dihydrocodeine Mepro Compound Suprol  Arthropan liquid Dopirydamole Methcarbomol with Aspirin Synalgos  ASA tablets/Enseals Disalcid Micrainin Tagament  Ascriptin Doan's Midol Talwin  Ascriptin A/D Dolene Mobidin Tanderil  Ascriptin Extra Strength Dolobid Moblgesic Ticlid  Ascriptin with Codeine Doloprin or Doloprin with Codeine Momentum Tolectin  Asperbuf Duoprin Mono-gesic Trendar  Aspergum Duradyne Motrin or Motrin IB Triminicin  Aspirin plain, buffered or enteric coated Durasal Myochrisine Trigesic  Aspirin Suppositories Easprin Nalfon Trillsate  Aspirin with Codeine Ecotrin Regular or Extra Strength Naprosyn Uracel  Atromid-S Efficin Naproxen Ursinus  Auranofin Capsules Elmiron Neocylate Vanquish  Axotal Emagrin Norgesic Verin  Azathioprine Empirin or Empirin with Codeine Normiflo Vitamin E  Azolid Emprazil Nuprin Voltaren  Bayer Aspirin plain, buffered or children's or timed BC Tablets or powders Encaprin Orgaran Warfarin Sodium  Buff-a-Comp Enoxaparin Orudis Zorpin  Buff-a-Comp with Codeine Equegesic Os-Cal-Gesic   Buffaprin Excedrin plain, buffered or Extra Strength Oxalid   Bufferin Arthritis Strength Feldene Oxphenbutazone   Bufferin plain or Extra Strength Feldene Capsules Oxycodone with Aspirin   Bufferin with Codeine  Fenoprofen Fenoprofen Pabalate or Pabalate-SF   Buffets II Flogesic Panagesic  Buffinol plain or Extra Strength Florinal or Florinal with Codeine Panwarfarin   Buf-Tabs Flurbiprofen Penicillamine   Butalbital Compound Four-way cold tablets Penicillin   Butazolidin Fragmin Pepto-Bismol   Carbenicillin Geminisyn Percodan   Carna Arthritis Reliever Geopen Persantine   Carprofen Gold's salt Persistin   Chloramphenicol Goody's Phenylbutazone   Chloromycetin Haltrain Piroxlcam   Clmetidine heparin Plaquenil   Cllnoril Hyco-pap Ponstel   Clofibrate Hydroxy chloroquine Propoxyphen         Before stopping any of these medications, be sure to consult the physician who ordered them.  Some, such as Coumadin (Warfarin) are ordered to prevent or treat serious conditions such as "deep thrombosis", "pumonary embolisms", and other heart problems.  The amount of time that you may need off of the medication may also vary with the medication and the reason for which you were taking it.  If you are taking any of these medications, please make sure you notify your pain physician before you undergo any procedures.         Facet Joint Block The facet joints connect the bones of the spine (vertebrae). They make it possible for you to bend, twist, and make other movements with your spine. They also prevent you from overbending, overtwisting, and making other excessive movements.  A facet joint block is a procedure where a numbing medicine (anesthetic) is injected into a facet joint. Often, a type of anti-inflammatory medicine called a steroid is also injected. A facet joint block may be done for two reasons:   Diagnosis. A facet joint block may be done as a test to see whether neck or back pain is caused by a worn-down or infected facet joint. If the pain gets better after a facet joint block, it means the pain is probably coming from the facet joint. If the pain does not get better, it means the pain is  probably not coming from the facet joint.   Therapy. A facet joint block may be done to relieve neck or back pain caused by a facet joint. A facet joint block is only done as a therapy if the pain does not improve with medicine, exercise programs, physical therapy, and other forms of pain management. LET [facility] CARE PROVIDER KNOW ABOUT:   Any allergies you have.   All medicines you are taking, including vitamins, herbs, eyedrops, and over-the-counter medicines and creams.   Previous problems you or members of your family have had with the use of anesthetics.   Any blood disorders you have had.   Other health problems you have. RISKS AND COMPLICATIONS Generally, having a facet joint block is safe. However, as with any procedure, complications can occur. Possible complications associated with having a facet joint block include:   Bleeding.   Injury to a nerve near the injection site.   Pain at the injection site.   Weakness or numbness in areas controlled by nerves near the injection site.   Infection.   Temporary fluid retention.   Allergic reaction to anesthetics or medicines used during the procedure. BEFORE THE PROCEDURE   Follow your health care provider's instructions if you are taking dietary supplements or medicines. You may need to stop taking them or reduce your dosage.   Do not take any new dietary supplements or medicines without asking your health care provider first.   Follow your health care provider's instructions about eating and drinking before the procedure. You may need to stop eating and drinking several hours before the procedure.  Arrange to have an adult drive you home after the procedure. PROCEDURE  You may need to remove your clothing and dress in an open-back gown so that your health care provider can access your spine.   The procedure will be done while you are lying on an X-ray table. Most of the time you will be asked to lie  on your stomach, but you may be asked to lie in a different position if an injection will be made in your neck.   Special machines will be used to monitor your oxygen levels, heart rate, and blood pressure.   If an injection will be made in your neck, an intravenous (IV) tube will be inserted into one of your veins. Fluids and medicine will flow directly into your body through the IV tube.   The area over the facet joint where the injection will be made will be cleaned with an antiseptic soap. The surrounding skin will be covered with sterile drapes.   An anesthetic will be applied to your skin to make the injection area numb. You may feel a temporary stinging or burning sensation.   A video X-ray machine will be used to locate the joint. A contrast dye may be injected into the facet joint area to help with locating the joint.   When the joint is located, an anesthetic medicine will be injected into the joint through the needle.   Your health care provider will ask you whether you feel pain relief. If you do feel relief, a steroid may be injected to provide pain relief for a longer period of time. If you do not feel relief or feel only partial relief, additional injections of an anesthetic may be made in other facet joints.   The needle will be removed, the skin will be cleansed, and bandages will be applied.  AFTER THE PROCEDURE   You will be observed for 15-30 minutes before being allowed to go home. Do not drive. Have an adult drive you or take a taxi or public transportation instead.   If you feel pain relief, the pain will return in several hours or days when the anesthetic wears off.   You may feel pain relief 2-14 days after the procedure. The amount of time this relief lasts varies from person to person.   It is normal to feel some tenderness over the injected area(s) for 2 days following the procedure.   If you have diabetes, you may have a temporary increase in blood  sugar. This information is not intended to replace advice given to you by your health care provider. Make sure you discuss any questions you have with your health care provider. Document Released: 03/14/2007 Document Revised: 11/13/2014 Document Reviewed: 07/19/2015 [employer]vier Interactive Patient Education  2017 [employer].

## 2016-11-20 ENCOUNTER — Ambulatory Visit: Payer: Commercial Managed Care - HMO | Admitting: Pain Medicine

## 2016-12-06 ENCOUNTER — Ambulatory Visit: Payer: Commercial Managed Care - HMO | Admitting: Pain Medicine

## 2016-12-07 ENCOUNTER — Ambulatory Visit: Payer: Commercial Managed Care - HMO | Admitting: Family Medicine

## 2016-12-11 ENCOUNTER — Ambulatory Visit (HOSPITAL_BASED_OUTPATIENT_CLINIC_OR_DEPARTMENT_OTHER): Payer: Medicare PPO | Admitting: Pain Medicine

## 2016-12-11 ENCOUNTER — Ambulatory Visit
Admission: RE | Admit: 2016-12-11 | Discharge: 2016-12-11 | Disposition: A | Discharge status: Home / Self Care | Payer: Medicare PPO | Source: Ambulatory Visit | Attending: Pain Medicine | Admitting: Pain Medicine

## 2016-12-11 ENCOUNTER — Encounter: Payer: Self-pay | Admitting: Pain Medicine

## 2016-12-11 VITALS — BP 115/83 | HR 76 | Temp 98.3°F | Resp 19 | Ht 61.0 in | Wt 130.0 lb

## 2016-12-11 DIAGNOSIS — M488X6 Other specified spondylopathies, lumbar region: Secondary | ICD-10-CM | POA: Insufficient documentation

## 2016-12-11 DIAGNOSIS — Z888 Allergy status to other drugs, medicaments and biological substances status: Secondary | ICD-10-CM | POA: Insufficient documentation

## 2016-12-11 DIAGNOSIS — M545 Low back pain: Secondary | ICD-10-CM | POA: Diagnosis not present

## 2016-12-11 DIAGNOSIS — M47816 Spondylosis without myelopathy or radiculopathy, lumbar region: Secondary | ICD-10-CM | POA: Insufficient documentation

## 2016-12-11 DIAGNOSIS — M5386 Other specified dorsopathies, lumbar region: Secondary | ICD-10-CM

## 2016-12-11 DIAGNOSIS — Z885 Allergy status to narcotic agent status: Secondary | ICD-10-CM | POA: Insufficient documentation

## 2016-12-11 DIAGNOSIS — Z90710 Acquired absence of both cervix and uterus: Secondary | ICD-10-CM | POA: Diagnosis not present

## 2016-12-11 DIAGNOSIS — G8929 Other chronic pain: Secondary | ICD-10-CM

## 2016-12-11 DIAGNOSIS — M549 Dorsalgia, unspecified: Secondary | ICD-10-CM | POA: Diagnosis present

## 2016-12-11 MED ORDER — TRIAMCINOLONE ACETONIDE 40 MG/ML IJ SUSP
40.0000 mg | Freq: Once | INTRAMUSCULAR | Status: AC
  Administered 2016-12-11: 40 mg
  Filled 2016-12-11: qty 1

## 2016-12-11 MED ORDER — MIDAZOLAM HCL 5 MG/5ML IJ SOLN
1.0000 mg | INTRAMUSCULAR | Status: DC | PRN
  Administered 2016-12-11: 2 mg via INTRAVENOUS
  Filled 2016-12-11: qty 5

## 2016-12-11 MED ORDER — ROPIVACAINE HCL 2 MG/ML IJ SOLN
9.0000 mL | Freq: Once | INTRAMUSCULAR | Status: AC
  Administered 2016-12-11: 9 mL via PERINEURAL
  Filled 2016-12-11: qty 10

## 2016-12-11 MED ORDER — LACTATED RINGERS IV SOLN
1000.0000 mL | Freq: Once | INTRAVENOUS | Status: AC
  Administered 2016-12-11: 1000 mL via INTRAVENOUS

## 2016-12-11 MED ORDER — LIDOCAINE HCL (PF) 1 % IJ SOLN
10.0000 mL | Freq: Once | INTRAMUSCULAR | Status: AC
  Administered 2016-12-11: 10 mL
  Filled 2016-12-11: qty 10

## 2016-12-11 MED ORDER — FENTANYL CITRATE (PF) 100 MCG/2ML IJ SOLN: 25.0000 ug | INTRAMUSCULAR | Status: DC | PRN

## 2016-12-11 NOTE — Progress Notes (Signed)
Safety precautions to be maintained throughout the outpatient stay will include: orient to surroundings, keep bed in low position, maintain call bell within reach at all times, provide assistance with transfer out of bed and ambulation.  

## 2016-12-11 NOTE — Progress Notes (Signed)
Patient's Name: Brittany Mays  MRN: [id number]  Referring Provider: Milinda Pointer, MD  DOB: 1941-10-07  PCP: [name], MD  DOS: 12/11/2016  Note by: Kathlen Brunswick. Dossie Arbour, MD  Service setting: Ambulatory outpatient  Location: [facility] (AMB) Pain Management Facility  Visit type: Procedure  Specialty: Interventional Pain Management  Patient type: Established   Primary Reason for Visit: Interventional Pain Management Treatment. CC: Back Pain (mostly on the right side.)  Procedure:  Anesthesia, Analgesia, Anxiolysis:  Type: Diagnostic Medial Branch Facet Block Region: Lumbar Level: L2, L3, L4, L5, & S1 Medial Branch Level(s) Laterality: Bilateral  Type: Local Anesthesia with Moderate (Conscious) Sedation Local Anesthetic: Lidocaine 1% Route: Intravenous (IV) IV Access: Secured Sedation: Meaningful verbal contact was maintained at all times during the procedure  Indication(s): Analgesia and Anxiety  Indications: 1. Lumbar facet syndrome (Bilateral) (R>L)   2. Lumbar spondylosis   3. Chronic low back pain (Location of Primary Source of Pain) (Bilateral) (R>L)    Pain Score: Pre-procedure: 7 /10 Post-procedure: 0-No pain/10  Pre-op Assessment:  Previous date of service: 11/13/16 Service provided: Med Refill Brittany Mays is a 76 y.o. (year old), female patient, seen today for interventional treatment. She  has a past surgical history that includes Abdominal hysterectomy; Knee surgery (Bilateral, 1990's nd 2002); Spine surgery; Parathyroidectomy [id number]); colonoscopy (2013); Appendectomy; Breast lumpectomy; Tonsillectomy; and Back surgery (07-2015). Her primarily concern today is the Back Pain (mostly on the right side.)  Initial Vital Signs: Blood pressure 113/61, pulse 76, temperature 98.3 F (36.8 C), temperature source Oral, resp. rate 16, height 5\' 1"  (1.549 m), weight 130 lb (59 kg), SpO2 98 %. BMI: 24.56 kg/m  Risk Assessment: Allergies: Reviewed. She is allergic to nsaids;  pentothal [thiopental]; and fentanyl.  Allergy Precautions: Avoid fentanyl Coagulopathies: "Reviewed. None identified.  Blood-thinner therapy: None at this time Active Infection(s): Reviewed. None identified. Brittany Mays is afebrile  Site Confirmation: Brittany Mays was asked to confirm the procedure and laterality before marking the site Procedure checklist: Completed Consent: Before the procedure and under the influence of no sedative(s), amnesic(s), or anxiolytics, the patient was informed of the treatment options, risks and possible complications. To fulfill our ethical and legal obligations, as recommended by the American Medical Association's Code of Ethics, I have informed the patient of my clinical impression; the nature and purpose of the treatment or procedure; the risks, benefits, and possible complications of the intervention; the alternatives, including doing nothing; the risk(s) and benefit(s) of the alternative treatment(s) or procedure(s); and the risk(s) and benefit(s) of doing nothing. The patient was provided information about the general risks and possible complications associated with the procedure. These may include, but are not limited to: failure to achieve desired goals, infection, bleeding, organ or nerve damage, allergic reactions, paralysis, and death. In addition, the patient was informed of those risks and complications associated to Spine-related procedures, such as failure to decrease pain; infection (i.e.: Meningitis, epidural or intraspinal abscess); bleeding (i.e.: epidural hematoma, subarachnoid hemorrhage, or any other type of intraspinal or peri-dural bleeding); organ or nerve damage (i.e.: Any type of peripheral nerve, nerve root, or spinal cord injury) with subsequent damage to sensory, motor, and/or autonomic systems, resulting in permanent pain, numbness, and/or weakness of one or several areas of the body; allergic reactions; (i.e.: anaphylactic reaction); and/or  death. Furthermore, the patient was informed of those risks and complications associated with the medications. These include, but are not limited to: allergic reactions (i.e.: anaphylactic or anaphylactoid reaction(s)); adrenal axis suppression;  blood sugar elevation that in diabetics may result in ketoacidosis or comma; water retention that in patients with history of congestive heart failure may result in shortness of breath, pulmonary edema, and decompensation with resultant heart failure; weight gain; swelling or edema; medication-induced neural toxicity; particulate matter embolism and blood vessel occlusion with resultant organ, and/or nervous system infarction; and/or aseptic necrosis of one or more joints. Finally, the patient was informed that Medicine is not an exact science; therefore, there is also the possibility of unforeseen or unpredictable risks and/or possible complications that may result in a catastrophic outcome. The patient indicated having understood very clearly. We have given the patient no guarantees and we have made no promises. Enough time was given to the patient to ask questions, all of which were answered to the patient's satisfaction. Brittany Mays has indicated that she wanted to continue with the procedure. Attestation: I, the ordering provider, attest that I have discussed with the patient the benefits, risks, side-effects, alternatives, likelihood of achieving goals, and potential problems during recovery for the procedure that I have provided informed consent. Date: 12/11/2016; Time: 9:48 AM  Pre-Procedure Preparation:  Monitoring: As per clinic protocol. Respiration, ETCO2, SpO2, BP, heart rate and rhythm monitor placed and checked for adequate function Safety Precautions: Patient was assessed for positional comfort and pressure points before starting the procedure. Time-out: I initiated and conducted the "Time-out" before starting the procedure, as per protocol. The  patient was asked to participate by confirming the accuracy of the "Time Out" information. Verification of the correct person, site, and procedure were performed and confirmed by me, the nursing staff, and the patient. "Time-out" conducted as per Joint Commission's Universal Protocol (UP.01.01.01). "Time-out" Date & Time: 12/11/2016; 1006 hrs.  Description of Procedure Process:   Position: Prone Target Area: For Lumbar Facet blocks, the target is the groove formed by the junction of the transverse process and superior articular process. For the L5 dorsal ramus, the target is the notch between superior articular process and sacral ala. For the S1 dorsal ramus, the target is the superior and lateral edge of the posterior S1 Sacral foramen. Approach: Paramedial approach. Area Prepped: Entire Posterior Lumbosacral Region Prepping solution: ChloraPrep (2% chlorhexidine gluconate and 70% isopropyl alcohol) Safety Precautions: Aspiration looking for blood return was conducted prior to all injections. At no point did we inject any substances, as a needle was being advanced. No attempts were made at seeking any paresthesias. Safe injection practices and needle disposal techniques used. Medications properly checked for expiration dates. SDV (single dose vial) medications used. Description of the Procedure: Protocol guidelines were followed. The patient was placed in position over the fluoroscopy table. The target area was identified and the area prepped in the usual manner. Skin desensitized using vapocoolant spray. Skin & deeper tissues infiltrated with local anesthetic. Appropriate amount of time allowed to pass for local anesthetics to take effect. The procedure needle was introduced through the skin, ipsilateral to the reported pain, and advanced to the target area. Employing the "Medial Branch Technique", the needles were advanced to the angle made by the superior and medial portion of the transverse process, and  the lateral and inferior portion of the superior articulating process of the targeted vertebral bodies. This area is known as "Burton's Eye" or the "Eye of the [place] Dog". A procedure needle was introduced through the skin, and this time advanced to the angle made by the superior and medial border of the sacral ala, and the lateral border of  the S1 vertebral body. This last needle was later repositioned at the superior and lateral border of the posterior S1 foramen. Negative aspiration confirmed. Solution injected in intermittent fashion, asking for systemic symptoms every 0.5cc of injectate. The needles were then removed and the area cleansed, making sure to leave some of the prepping solution back to take advantage of its long term bactericidal properties. Start Time: 1008 hrs. End Time: 1024 hrs.  Illustration of the posterior view of the lumbar spine and the posterior neural structures. Laminae of L2 through S1 are labeled. DPRL5, dorsal primary ramus of L5; DPRS1, dorsal primary ramus of S1; DPR3, dorsal primary ramus of L3; FJ, facet (zygapophyseal) joint L3-L4; I, inferior articular process of L4; LB1, lateral branch of dorsal primary ramus of L1; IAB, inferior articular branches from L3 medial branch (supplies L4-L5 facet joint); IBP, intermediate branch plexus; MB3, medial branch of dorsal primary ramus of L3; NR3, third lumbar nerve root; S, superior articular process of L5; SAB, superior articular branches from L4 (supplies L4-5 facet joint also); TP3, transverse process of L3.  Materials:  Needle(s) Type: Epidural needle Gauge: 22G Length: 3.5-in Medication(s): We administered lactated ringers, midazolam, triamcinolone acetonide, lidocaine (PF), ropivacaine (PF) 2 mg/mL (0.2%), ropivacaine (PF) 2 mg/mL (0.2%), and triamcinolone acetonide. Please see chart orders for dosing details.  Imaging Guidance (Spinal):  Type of Imaging Technique: Fluoroscopy Guidance (Spinal) Indication(s):  Assistance in needle guidance and placement for procedures requiring needle placement in or near specific anatomical locations not easily accessible without such assistance. Exposure Time: Please see nurses notes. Contrast: None used. Fluoroscopic Guidance: I was personally present during the use of fluoroscopy. "Tunnel Vision Technique" used to obtain the best possible view of the target area. Parallax error corrected before commencing the procedure. "Direction-depth-direction" technique used to introduce the needle under continuous pulsed fluoroscopy. Once target was reached, antero-posterior, oblique, and lateral fluoroscopic projection used confirm needle placement in all planes. Images permanently stored in EMR. Interpretation: No contrast injected. I personally interpreted the imaging intraoperatively. Adequate needle placement confirmed in multiple planes. Permanent images saved into the patient's record.  Antibiotic Prophylaxis:  Indication(s): None identified Antibiotic given: None  Post-operative Assessment:  EBL: None Complications: No immediate post-treatment complications observed by team, or reported by patient. Note: The patient tolerated the entire procedure well. A repeat set of vitals were taken after the procedure and the patient was kept under observation following institutional policy, for this type of procedure. Post-procedural neurological assessment was performed, showing return to baseline, prior to discharge. The patient was provided with post-procedure discharge instructions, including a section on how to identify potential problems. Should any problems arise concerning this procedure, the patient was given instructions to immediately contact us, at any time, without hesitation. In any case, we plan to contact the patient by telephone for a follow-up status report regarding this interventional procedure. Comments:  No additional relevant information.  Plan of Care    Disposition: Discharge home  Discharge Date & Time: 12/11/2016; 1105 hrs.  Physician-requested Follow-up:  Return in about 2 weeks (around 12/25/2016) for Post-Procedure evaluation.  Future Appointments Date Time Provider [place]  01/10/2017 3:00 PM [name], MD [facility] None  01/15/2017 1:45 PM Milinda Pointer, MD [facility] None   Medications ordered for procedure: Meds ordered this encounter  Medications  . DISCONTD: fentaNYL (SUBLIMAZE) injection 25-50 mcg    Make sure Narcan is available in the pyxis when using this medication. In the event of respiratory depression (RR< 8/min):  Titrate NARCAN (naloxone) in increments of 0.1 to 0.2 mg IV at 2-3 minute intervals, until desired degree of reversal.  . lactated ringers infusion 1,000 mL  . midazolam (VERSED) 5 MG/5ML injection 1-2 mg    Make sure Flumazenil is available in the pyxis when using this medication. If oversedation occurs, administer 0.2 mg IV over 15 sec. If after 45 sec no response, administer 0.2 mg again over 1 min; may repeat at 1 min intervals; not to exceed 4 doses (1 mg)  . triamcinolone acetonide (KENALOG-40) injection 40 mg  . lidocaine (PF) (XYLOCAINE) 1 % injection 10 mL  . ropivacaine (PF) 2 mg/mL (0.2%) (NAROPIN) injection 9 mL  . ropivacaine (PF) 2 mg/mL (0.2%) (NAROPIN) injection 9 mL  . triamcinolone acetonide (KENALOG-40) injection 40 mg   Medications administered: We administered lactated ringers, midazolam, triamcinolone acetonide, lidocaine (PF), ropivacaine (PF) 2 mg/mL (0.2%), ropivacaine (PF) 2 mg/mL (0.2%), and triamcinolone acetonide.  See the medical record for exact dosing, route, and time of administration.  Lab-work, Procedure(s), & Referral(s) Ordered: Orders Placed This Encounter  Procedures  . DG C-Arm 1-60 Min-No Report  . Discharge instructions  . Follow-up  . Informed Consent Details: Transcribe to consent form and obtain patient signature  . Provider attestation of  informed consent for procedure/surgical case  . Verify informed consent   Imaging Ordered: Results for orders placed in visit on 10/09/16  DG C-Arm 1-60 Min-No Report   Narrative There is no report for this exam.   New Prescriptions   No medications on file   Primary Care Physician: [name], MD Location: [facility] Note by: Kathlen Brunswick. Dossie Arbour, M.D, DABA, DABAPM, DABPM, DABIPP, FIPP Date: 12/11/2016; Time: 11:34 AM  Disclaimer:  Medicine is not an exact science. The only guarantee in medicine is that nothing is guaranteed. It is important to note that the decision to proceed with this intervention was based on the information collected from the patient. The Data and conclusions were drawn from the patient's questionnaire, the interview, and the physical examination. Because the information was provided in large part by the patient, it cannot be guaranteed that it has not been purposely or unconsciously manipulated. Every effort has been made to obtain as much relevant data as possible for this evaluation. It is important to note that the conclusions that lead to this procedure are derived in large part from the available data. Always take into account that the treatment will also be dependent on availability of resources and existing treatment guidelines, considered by other Pain Management Practitioners as being common knowledge and practice, at the time of the intervention. For Medico-Legal purposes, it is also important to point out that variation in procedural techniques and pharmacological choices are the acceptable norm. The indications, contraindications, technique, and results of the above procedure should only be interpreted and judged by a Board-Certified Interventional Pain Specialist with extensive familiarity and expertise in the same exact procedure and technique. Attempts at providing opinions without similar or greater experience and expertise  than that of the treating physician will be considered as inappropriate and unethical, and shall result in a formal complaint to the state medical board and applicable specialty societies.  Instructions provided at this appointment: Patient Instructions  Please complete your Post Procedure Diary  Pain Management Discharge Instructions  General Discharge Instructions :  If you need to reach your doctor call: Monday-Friday 8:00 am - 4:00 pm at [phone] or toll free [phone].  After clinic  hours [phone] to have operator reach doctor.  Bring all of your medication bottles to all your appointments in the pain clinic.  To cancel or reschedule your appointment with Pain Management please remember to call 24 hours in advance to avoid a fee.  Refer to the educational materials which you have been given on: General Risks, I had my Procedure. Discharge Instructions, Post Sedation.  Post Procedure Instructions:  The drugs you were given will stay in your system until tomorrow, so for the next 24 hours you should not drive, make any legal decisions or drink any alcoholic beverages.  You may eat anything you prefer, but it is better to start with liquids then soups and crackers, and gradually work up to solid foods.  Please notify your doctor immediately if you have any unusual bleeding, trouble breathing or pain that is not related to your normal pain.  Depending on the type of procedure that was done, some parts of your body may feel week and/or numb.  This usually clears up by tonight or the next day.  Walk with the use of an assistive device or accompanied by an adult for the 24 hours.  You may use ice on the affected area for the first 24 hours.  Put ice in a Ziploc bag and cover with a towel and place against area 15 minutes on 15 minutes off.  You may switch to heat after 24 hours.

## 2016-12-11 NOTE — Patient Instructions (Signed)
Please complete your Post Procedure Diary  Pain Management Discharge Instructions  General Discharge Instructions :  If you need to reach your doctor call: Monday-Friday 8:00 am - 4:00 pm at [phone] or toll free [phone].  After clinic hours [phone] to have operator reach doctor.  Bring all of your medication bottles to all your appointments in the pain clinic.  To cancel or reschedule your appointment with Pain Management please remember to call 24 hours in advance to avoid a fee.  Refer to the educational materials which you have been given on: General Risks, I had my Procedure. Discharge Instructions, Post Sedation.  Post Procedure Instructions:  The drugs you were given will stay in your system until tomorrow, so for the next 24 hours you should not drive, make any legal decisions or drink any alcoholic beverages.  You may eat anything you prefer, but it is better to start with liquids then soups and crackers, and gradually work up to solid foods.  Please notify your doctor immediately if you have any unusual bleeding, trouble breathing or pain that is not related to your normal pain.  Depending on the type of procedure that was done, some parts of your body may feel week and/or numb.  This usually clears up by tonight or the next day.  Walk with the use of an assistive device or accompanied by an adult for the 24 hours.  You may use ice on the affected area for the first 24 hours.  Put ice in a Ziploc bag and cover with a towel and place against area 15 minutes on 15 minutes off.  You may switch to heat after 24 hours.

## 2016-12-12 ENCOUNTER — Telehealth: Payer: Self-pay | Admitting: *Deleted

## 2016-12-12 NOTE — Telephone Encounter (Signed)
Attempted to call patient for post procedure follow up. No answer.

## 2016-12-13 ENCOUNTER — Telehealth: Payer: Self-pay | Admitting: Pain Medicine

## 2016-12-13 NOTE — Telephone Encounter (Signed)
Having generalized weakness, states "I can hardly get out of bed". No appetite. Denies pain. Denies fever.

## 2016-12-13 NOTE — Telephone Encounter (Signed)
Not doing well since procedure and would like to speak with nurse

## 2016-12-13 NOTE — Telephone Encounter (Signed)
Patient advised that generalized weakness probably not caused by facet block. Should see PCP.

## 2016-12-21 ENCOUNTER — Ambulatory Visit: Payer: Medicare PPO

## 2016-12-25 ENCOUNTER — Telehealth: Payer: Self-pay | Admitting: Pain Medicine

## 2016-12-25 NOTE — Telephone Encounter (Signed)
Patient has a referral to Dr. Marjory Lies for Depression treatment. Dr. Marjory Lies is no longer taking on patients for Treatment, he is only doing med psych evals. Please let Dr. Dossie Arbour know. We will have to refer patient to a different psychologist.

## 2016-12-25 NOTE — Telephone Encounter (Signed)
Brittany Mays has already taken care of this referral.

## 2016-12-29 ENCOUNTER — Ambulatory Visit: Payer: Medicare PPO

## 2017-01-10 ENCOUNTER — Ambulatory Visit (INDEPENDENT_AMBULATORY_CARE_PROVIDER_SITE_OTHER): Payer: Medicare PPO | Admitting: Family Medicine

## 2017-01-10 ENCOUNTER — Encounter: Payer: Self-pay | Admitting: Family Medicine

## 2017-01-10 VITALS — BP 110/70 | HR 94 | Temp 98.5°F | Wt 128.6 lb

## 2017-01-10 DIAGNOSIS — I712 Thoracic aortic aneurysm, without rupture, unspecified: Secondary | ICD-10-CM

## 2017-01-10 DIAGNOSIS — F321 Major depressive disorder, single episode, moderate: Secondary | ICD-10-CM | POA: Diagnosis not present

## 2017-01-10 DIAGNOSIS — E785 Hyperlipidemia, unspecified: Secondary | ICD-10-CM | POA: Diagnosis not present

## 2017-01-10 DIAGNOSIS — M48061 Spinal stenosis, lumbar region without neurogenic claudication: Secondary | ICD-10-CM

## 2017-01-10 DIAGNOSIS — F418 Other specified anxiety disorders: Secondary | ICD-10-CM | POA: Diagnosis not present

## 2017-01-10 DIAGNOSIS — F329 Major depressive disorder, single episode, unspecified: Secondary | ICD-10-CM

## 2017-01-10 DIAGNOSIS — R251 Tremor, unspecified: Secondary | ICD-10-CM

## 2017-01-10 DIAGNOSIS — Z0000 Encounter for general adult medical examination without abnormal findings: Secondary | ICD-10-CM | POA: Diagnosis not present

## 2017-01-10 DIAGNOSIS — F419 Anxiety disorder, unspecified: Secondary | ICD-10-CM

## 2017-01-10 NOTE — Progress Notes (Signed)
  [name], MD Phone: [phone]  Brittany Mays is a 76 y.o. female who presents today for follow-up.  Anxiety/depression: Patient notes this is unchanged. She continues on Cymbalta. She does note some sleep issues with trouble falling asleep and staying asleep. No SI or HI.  Continues to take red yeast rice and fish oil for hypertriglyceridemia. No chest pain or claudication.  Low back pain: Continues to have issues with this. She's had facet block injections that were beneficial the first time. No radiation down her legs. Hurts with any activity. No numbness, weakness, loss of bowel or bladder function, saddle anesthesia, or fevers.  Had follow-up CT scan for her aortic aneurysm that was stable. Recommend yearly follow-up.  She notes hand tremor and voice tremor. Saw ENT and they did a scope to look at her vocal cords which they stated were tremoring. Feels as though she can't talk right. No tremor of her hands that is worse with intention. She sees neurology tomorrow.  PMH: Smoker   ROS see history of present illness  Objective  Physical Exam Vitals:   01/10/17 1510  BP: 110/70  Pulse: 94  Temp: 98.5 F (36.9 C)    BP Readings from Last 3 Encounters:  01/10/17 110/70  12/11/16 (P) 130/69  11/13/16 134/73   Wt Readings from Last 3 Encounters:  01/10/17 128 lb 9.6 oz (58.3 kg)  12/11/16 130 lb (59 kg)  11/13/16 126 lb (57.2 kg)    Physical Exam  Constitutional: No distress.  Cardiovascular: Normal rate, regular rhythm and normal heart sounds.   Pulmonary/Chest: Effort normal and breath sounds normal.  Musculoskeletal:  No midline spine tenderness, no midline spine step-off, no muscular back tenderness  Neurological: She is alert.  CN 2-12 intact, 5/5 strength in bilateral biceps, triceps, grip, quads, hamstrings, plantar and dorsiflexion, sensation to light touch intact in bilateral UE and LE, intention tremor on writing, normal gait, no cogwheel rigidity    Skin: She is not diaphoretic.  Psychiatric:  Mood anxious, affect anxious     Assessment/Plan: Please see individual problem list.  Aortic aneurysm (HCC) No chest pain. Needs yearly follow-up on this. Given return precautions.  Lumbar spinal stenosis Continues to have issues with this. She is following up with her pain specialist soon. She'll continue her current regimen through them.  Hyperlipidemia She'll return for fasting lipid panel.  Anxiety and depression Continue Cymbalta. We'll refer to psychology for further help in managing this.  Tremor Suspect benign essential tremor given that it typically occurs with intention. There is no cogwheeling on exam of her arms and there is no shuffling of her gait. She will see neurology tomorrow for evaluation of her voice and tremor.    Orders Placed This Encounter  Procedures  . Lipid Profile    Standing Status:   Future    Standing Expiration Date:   01/10/2018  . Ambulatory referral to Psychology    Referral Priority:   Routine    Referral Type:   Psychiatric    Referral Reason:   Specialty Services Required    Requested Specialty:   Psychology    Number of Visits Requested:   [place], MD [place]

## 2017-01-10 NOTE — Assessment & Plan Note (Addendum)
No chest pain. Needs yearly follow-up on this. Given return precautions.

## 2017-01-10 NOTE — Assessment & Plan Note (Signed)
Continue Cymbalta. We'll refer to psychology for further help in managing this.

## 2017-01-10 NOTE — Patient Instructions (Addendum)
Nice to see you. Please see the neurologist as planned tomorrow. We'll get she does see psychology as well. We will check your cholesterol at a future date when you have been fasting.     These are the goals we discussed: Goals    . Increase physical activity          Walk for exercise 3 days, 20 minutes.       This is a list of the screening recommended for you and due dates:  Health Maintenance  Topic Date Due  . Tetanus Vaccine  08/25/2022  . Flu Shot  Completed  . DEXA scan (bone density measurement)  Completed  . Pneumonia vaccines  Completed      Steps to Quit Smoking Smoking tobacco can be harmful to your health and can affect almost every organ in your body. Smoking puts you, and those around you, at risk for developing many serious chronic diseases. Quitting smoking is difficult, but it is one of the best things that you can do for your health. It is never too late to quit. What are the benefits of quitting smoking? When you quit smoking, you lower your risk of developing serious diseases and conditions, such as:  Lung cancer or lung disease, such as COPD.  Heart disease.  Stroke.  Heart attack.  Infertility.  Osteoporosis and bone fractures. Additionally, symptoms such as coughing, wheezing, and shortness of breath may get better when you quit. You may also find that you get sick less often because your body is stronger at fighting off colds and infections. If you are pregnant, quitting smoking can help to reduce your chances of having a baby of low birth weight. How do I get ready to quit? When you decide to quit smoking, create a plan to make sure that you are successful. Before you quit:  Pick a date to quit. Set a date within the next two weeks to give you time to prepare.  Write down the reasons why you are quitting. Keep this list in places where you will see it often, such as on your bathroom mirror or in your car or wallet.  Identify the people,  places, things, and activities that make you want to smoke (triggers) and avoid them. Make sure to take these actions:  Throw away all cigarettes at home, at work, and in your car.  Throw away smoking accessories, such as Scientist, research (medical).  Clean your car and make sure to empty the ashtray.  Clean your home, including curtains and carpets.  Tell your family, friends, and coworkers that you are quitting. Support from your loved ones can make quitting easier.  Talk with your health care provider about your options for quitting smoking.  Find out what treatment options are covered by your health insurance. What strategies can I use to quit smoking? Talk with your healthcare provider about different strategies to quit smoking. Some strategies include:  Quitting smoking altogether instead of gradually lessening how much you smoke over a period of time. Research shows that quitting "cold [place]" is more successful than gradually quitting.  Attending in-person counseling to help you build problem-solving skills. You are more likely to have success in quitting if you attend several counseling sessions. Even short sessions of 10 minutes can be effective.  Finding resources and support systems that can help you to quit smoking and remain smoke-free after you quit. These resources are most helpful when you use them often. They can include:  Online  chats with a Social worker.  Telephone quitlines.  Printed Furniture conservator/restorer.  Support groups or group counseling.  Text messaging programs.  Mobile phone applications.  Taking medicines to help you quit smoking. (If you are pregnant or breastfeeding, talk with your health care provider first.) Some medicines contain nicotine and some do not. Both types of medicines help with cravings, but the medicines that include nicotine help to relieve withdrawal symptoms. Your health care provider may recommend:  Nicotine patches, gum, or  lozenges.  Nicotine inhalers or sprays.  Non-nicotine medicine that is taken by mouth. Talk with your health care provider about combining strategies, such as taking medicines while you are also receiving in-person counseling. Using these two strategies together makes you more likely to succeed in quitting than if you used either strategy on its own. If you are pregnant or breastfeeding, talk with your health care provider about finding counseling or other support strategies to quit smoking. Do not take medicine to help you quit smoking unless told to do so by your health care provider. What things can I do to make it easier to quit? Quitting smoking might feel overwhelming at first, but there is a lot that you can do to make it easier. Take these important actions:  Reach out to your family and friends and ask that they support and encourage you during this time. Call telephone quitlines, reach out to support groups, or work with a counselor for support.  Ask people who smoke to avoid smoking around you.  Avoid places that trigger you to smoke, such as bars, parties, or smoke-break areas at work.  Spend time around people who do not smoke.  Lessen stress in your life, because stress can be a smoking trigger for some people. To lessen stress, try:  Exercising regularly.  Deep-breathing exercises.  Yoga.  Meditating.  Performing a body scan. This involves closing your eyes, scanning your body from head to toe, and noticing which parts of your body are particularly tense. Purposefully relax the muscles in those areas.  Download or purchase mobile phone or tablet apps (applications) that can help you stick to your quit plan by providing reminders, tips, and encouragement. There are many free apps, such as QuitGuide from the [employer] Office manager for Disease Control and Prevention). You can find other support for quitting smoking (smoking cessation) through smokefree.gov and other websites. How  will I feel when I quit smoking? Within the first 24 hours of quitting smoking, you may start to feel some withdrawal symptoms. These symptoms are usually most noticeable 2-3 days after quitting, but they usually do not last beyond 2-3 weeks. Changes or symptoms that you might experience include:  Mood swings.  Restlessness, anxiety, or irritation.  Difficulty concentrating.  Dizziness.  Strong cravings for sugary foods in addition to nicotine.  Mild weight gain.  Constipation.  Nausea.  Coughing or a sore throat.  Changes in how your medicines work in your body.  A depressed mood.  Difficulty sleeping (insomnia). After the first 2-3 weeks of quitting, you may start to notice more positive results, such as:  Improved sense of smell and taste.  Decreased coughing and sore throat.  Slower heart rate.  Lower blood pressure.  Clearer skin.  The ability to breathe more easily.  Fewer sick days. Quitting smoking is very challenging for most people. Do not get discouraged if you are not successful the first time. Some people need to make many attempts to quit before they achieve long-term success.  Do your best to stick to your quit plan, and talk with your health care provider if you have any questions or concerns. This information is not intended to replace advice given to you by your health care provider. Make sure you discuss any questions you have with your health care provider. Document Released: 10/17/2001 Document Revised: 06/20/2016 Document Reviewed: 03/09/2015 Elsevier Interactive Patient Education  2017 [employer].

## 2017-01-10 NOTE — Assessment & Plan Note (Signed)
She'll return for fasting lipid panel. 

## 2017-01-10 NOTE — Assessment & Plan Note (Signed)
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 There is no cogwheeling on exam of her arms and there is no shuffling of her gait. She will see neurology tomorrow for evaluation of her voice and tremor.

## 2017-01-10 NOTE — Progress Notes (Signed)
Subjective:   Brittany Mays is a 76 y.o. female who presents for an Initial Medicare Annual Wellness Visit.  Review of Systems    No ROS.  Medicare Wellness Visit.  Cardiac Risk Factors include: advanced age (>75men, >40 women)     Objective:    Today's Vitals   01/10/17 1510  BP: 110/70  Pulse: 94  Temp: 98.5 F (36.9 C)  TempSrc: Oral  SpO2: 97%  Weight: 128 lb 9.6 oz (58.3 kg)   Body mass index is 24.3 kg/m.   Current Medications (verified) Outpatient Encounter Prescriptions as of 01/10/2017  Medication Sig  . Ascorbic Acid (VITAMIN C) 500 MG CAPS Take by mouth daily.  . DULoxetine (CYMBALTA) 60 MG capsule Take 1 capsule (60 mg total) by mouth daily.  . fish oil-omega-3 fatty acids 1000 MG capsule Take 1 g by mouth daily.   Marland Kitchen gabapentin (NEURONTIN) 600 MG tablet 2 (two) times daily. 300 mg in a.m., 600 mg at night  . lubiprostone (AMITIZA) 8 MCG capsule Take 1 capsule (8 mcg total) by mouth 2 (two) times daily with a meal. Swallow the medication whole. Do not break or chew the medication.  . Melatonin 3 MG CAPS Take 1 capsule (3 mg total) by mouth at bedtime as needed (insomnia). (Patient taking differently: Take 5 mg by mouth at bedtime as needed (insomnia). )  . Misc Natural Products (OSTEO BI-FLEX ADV TRIPLE ST PO) Take by mouth.  . Multiple Vitamins-Minerals (WOMENS MULTI) CAPS Take one tablet once daily  . naloxone [facility]) 2 MG/2ML injection Inject content of syringe into thigh muscle. Call 911.  . omeprazole (PRILOSEC) 40 MG capsule   . Oxycodone HCl 10 MG TABS Take 1 tablet (10 mg total) by mouth every 6 (six) hours as needed.  Derrill Memo ON 01/19/2017] Oxycodone HCl 10 MG TABS Take 1 tablet (10 mg total) by mouth every 6 (six) hours as needed.  . Polyethylene Glycol 3350 GRAN   . Red Yeast Rice 600 MG CAPS Take 2 capsules (1,200 mg total) by mouth daily.  . vitamin B-12 (CYANOCOBALAMIN) 100 MCG tablet Take 1,000 mcg by mouth every other day.   . Wheat Dextrin  (BENEFIBER) POWD Stir 2 tsp. TID into 4-8 oz of any non-carbonated beverage or soft food (hot or cold)  . [DISCONTINUED] glucosamine-chondroitin 500-400 MG tablet Take 2 tablets by mouth daily.  . Oxycodone HCl 10 MG TABS Take 1 tablet (10 mg total) by mouth every 6 (six) hours as needed.   Facility-Administered Encounter Medications as of 01/10/2017  Medication  . midazolam (VERSED) 5 MG/5ML injection 1-2 mg    Allergies (verified) Nsaids; Pentothal [thiopental]; and Fentanyl   History: Past Medical History:  Diagnosis Date  . Anxiety   . Aortic aneurysm ([place]) 06/06/2016  . Cataract   . Chest pain 08/18/2015  . Decreased muscle strength 02/10/2014  . Depression   . Difficulty in walking(719.7) 02/10/2014  . Dysphagia 08/19/2015  . Dyspnea 08/19/2015  . Hyperparathyroidism, primary ([place])    s/p parathyroidectomy  . Hyperventilation 08/18/2015  . Insomnia   . Intractable back pain 04/29/2014  . Kidney stone   . Lumbar spinal stenosis    s/p laminectomy  . Odynophagia 08/18/2015  . Osteoarthritis of both knees    s/p knee replacements  . Pain   . Rectal prolapse    s/p repair  . Unintentional weight loss 09/08/2015  . UTI (lower urinary tract infection) 04/28/2014   Past Surgical History:  Procedure  Laterality Date  . ABDOMINAL HYSTERECTOMY    . APPENDECTOMY    . BACK SURGERY  07-2015   major surgery  . BREAST LUMPECTOMY     Reports this is benign  . colonoscopy  2013  . KNEE SURGERY Bilateral 1990's nd 2002   Knee Replacements  . PARATHYROIDECTOMY  1990's  . SPINE SURGERY    . TONSILLECTOMY     Family History  Problem Relation Age of Onset  . Mental illness Mother   . COPD Father   . Heart failure Father   . Diabetes Son    Social History   Occupational History  . Not on file.   Social History Main Topics  . Smoking status: Current Every Day Smoker    Packs/day: 0.50    Years: 50.00    Types: Cigarettes  . Smokeless tobacco: Never Used  . Alcohol use No    . Drug use: No  . Sexual activity: Not Currently    Tobacco Counseling Ready to quit: Not Answered Counseling given: Not Answered   Activities of Daily Living In your present state of health, do you have any difficulty performing the following activities: 01/10/2017  Hearing? N  Vision? N  Difficulty concentrating or making decisions? N  Walking or climbing stairs? N  Dressing or bathing? N  Doing errands, shopping? N  Preparing Food and eating ? N  Using the Toilet? N  In the past six months, have you accidently leaked urine? N  Do you have problems with loss of bowel control? N  Managing your Medications? N  Managing your Finances? N  Housekeeping or managing your Housekeeping? N  Some recent data might be hidden    Immunizations and Health Maintenance Immunization History  Administered Date(s) Administered  . Influenza, High Dose Seasonal PF 09/05/2016  . Influenza,inj,Quad PF,36+ Mos 07/16/2014  . Influenza-Unspecified 07/07/2013, 09/07/2015  . Pneumococcal Conjugate-13 10/09/2014  . Pneumococcal Polysaccharide-23 08/25/2012  . Tdap 08/25/2012   There are no preventive care reminders to display for this patient.  Patient Care Team: [name], MD as PCP - General (Family Medicine)  Indicate any recent Medical Services you may have received from other than [facility] providers in the past year (date may be approximate).     Assessment:   This is a routine wellness examination for Brittany Mays. The goal of the wellness visit is to assist the patient how to close the gaps in care and create a preventative care plan for the patient.   Osteoporosis risk reviewed.  Medications reviewed; taking without issues or barriers.  Safety issues reviewed; smoke detectors in the home. No firearms in the home. Wears seatbelts when driving or riding with others. Patient does wear sunscreen or protective clothing when in direct sunlight. No violence in the home.  Patient is  alert, normal appearance, oriented to person/place/and time. Correctly identified the president of the [place], recall of 3/3 objects, and performing simple calculations.  Patient displays appropriate judgement and can read correct time from watch face.  No new identified risk were noted.  No failures at ADL's or IADL's.   BMI- discussed the importance of a healthy diet, water intake and exercise. Educational material provided.   Dental- every six months.  Sleep patterns- Sleeps 5-6 hours at night.  Wakes feeling rested.  Health maintenance gaps- closed.  Patient Concerns: None at this time. Follow up with PCP as needed.  Hearing/Vision screen Hearing Screening Comments: Patient is able to hear conversational tones without difficulty.  No issues reported.   Vision Screening Comments: Followed by [facility] Wears corrective lenses when reading Cataract extraction, bilateral Visual acuity not assessed per patient preference since they have regular follow up with the ophthalmologist  Dietary issues and exercise activities discussed: Current Exercise Habits: Home exercise routine, Type of exercise: walking, Time (Minutes): 20, Frequency (Times/Week): 2, Weekly Exercise (Minutes/Week): 40, Intensity: Mild  Goals    . Increase physical activity          Walk for exercise 3 days, 20 minutes.      Depression Screen PHQ 2/9 Scores 01/10/2017 12/11/2016 11/13/2016 09/13/2016 08/16/2016 08/15/2016 07/19/2016  PHQ - 2 Score 1 0 0 0 0 0 0  PHQ- 9 Score 2 - - - - - -    Fall Risk Fall Risk  01/10/2017 12/11/2016 11/13/2016 10/09/2016 09/13/2016  Falls in the past year? No No No No No  Number falls in past yr: - - - - -  Injury with Fall? - - - - -    Cognitive Function:     6CIT Screen 01/10/2017  What Year? 0 points  What month? 0 points  What time? 0 points  Count back from 20 0 points  Months in reverse 0 points  Repeat phrase 0 points  Total Score 0    Screening Tests Health  Maintenance  Topic Date Due  . TETANUS/TDAP  08/25/2022  . INFLUENZA VACCINE  Completed  . DEXA SCAN  Completed  . PNA vac Low Risk Adult  Completed      Plan:    End of life planning; Advance aging; Advanced directives discussed. Copy of current HCPOA/Living Will requested.    Medicare Attestation I have personally reviewed: The patient's medical and social history Their use of alcohol, tobacco or illicit drugs Their current medications and supplements The patient's functional ability including ADLs,fall risks, home safety risks, cognitive, and hearing and visual impairment Diet and physical activities Evidence for depression   The patient's weight, height, BMI, and visual acuity have been recorded in the chart.  I have made referrals and provided education to the patient based on review of the above and I have provided the patient with a written personalized care plan for preventive services.    During the course of the visit, Brittany Mays was educated and counseled about the following appropriate screening and preventive services:   Vaccines to include Pneumoccal, Influenza, Hepatitis B, Td, Zostavax, HCV  Colorectal cancer screening-UTD  Bone density screening-UTD  Mammography-UTD  Nutrition counseling  Smoking cessation-educational material provided  Patient Instructions (the written plan) were given to the patient.    Varney Biles, LPN   02/12/6758

## 2017-01-10 NOTE — Progress Notes (Signed)
Pre visit review using our clinic review tool, if applicable. No additional management support is needed unless otherwise documented below in the visit note. 

## 2017-01-10 NOTE — Assessment & Plan Note (Signed)
Continues to have issues with this. She is following up with her pain specialist soon. She'll continue her current regimen through them.

## 2017-01-15 ENCOUNTER — Ambulatory Visit: Payer: Medicare PPO | Admitting: Pain Medicine

## 2017-01-16 ENCOUNTER — Other Ambulatory Visit: Payer: Self-pay | Admitting: Neurology

## 2017-01-16 DIAGNOSIS — G250 Essential tremor: Secondary | ICD-10-CM

## 2017-01-23 ENCOUNTER — Encounter: Payer: Self-pay | Admitting: Pain Medicine

## 2017-01-23 ENCOUNTER — Ambulatory Visit: Payer: Medicare PPO | Attending: Pain Medicine | Admitting: Pain Medicine

## 2017-01-23 VITALS — BP 128/61 | HR 62 | Temp 97.9°F | Resp 18 | Ht 61.0 in | Wt 128.0 lb

## 2017-01-23 DIAGNOSIS — G4700 Insomnia, unspecified: Secondary | ICD-10-CM | POA: Diagnosis not present

## 2017-01-23 DIAGNOSIS — Z5181 Encounter for therapeutic drug level monitoring: Secondary | ICD-10-CM | POA: Insufficient documentation

## 2017-01-23 DIAGNOSIS — Z8249 Family history of ischemic heart disease and other diseases of the circulatory system: Secondary | ICD-10-CM | POA: Insufficient documentation

## 2017-01-23 DIAGNOSIS — Z79899 Other long term (current) drug therapy: Secondary | ICD-10-CM | POA: Insufficient documentation

## 2017-01-23 DIAGNOSIS — R1032 Left lower quadrant pain: Secondary | ICD-10-CM | POA: Diagnosis not present

## 2017-01-23 DIAGNOSIS — Z87440 Personal history of urinary (tract) infections: Secondary | ICD-10-CM | POA: Insufficient documentation

## 2017-01-23 DIAGNOSIS — M961 Postlaminectomy syndrome, not elsewhere classified: Secondary | ICD-10-CM | POA: Insufficient documentation

## 2017-01-23 DIAGNOSIS — M47816 Spondylosis without myelopathy or radiculopathy, lumbar region: Secondary | ICD-10-CM | POA: Insufficient documentation

## 2017-01-23 DIAGNOSIS — M25552 Pain in left hip: Secondary | ICD-10-CM

## 2017-01-23 DIAGNOSIS — Z6824 Body mass index (BMI) 24.0-24.9, adult: Secondary | ICD-10-CM | POA: Diagnosis not present

## 2017-01-23 DIAGNOSIS — T402X5A Adverse effect of other opioids, initial encounter: Secondary | ICD-10-CM | POA: Diagnosis not present

## 2017-01-23 DIAGNOSIS — Z87442 Personal history of urinary calculi: Secondary | ICD-10-CM | POA: Insufficient documentation

## 2017-01-23 DIAGNOSIS — M48061 Spinal stenosis, lumbar region without neurogenic claudication: Secondary | ICD-10-CM | POA: Diagnosis not present

## 2017-01-23 DIAGNOSIS — K5903 Drug induced constipation: Secondary | ICD-10-CM | POA: Diagnosis not present

## 2017-01-23 DIAGNOSIS — M1288 Other specific arthropathies, not elsewhere classified, other specified site: Secondary | ICD-10-CM | POA: Diagnosis not present

## 2017-01-23 DIAGNOSIS — M545 Low back pain: Secondary | ICD-10-CM

## 2017-01-23 DIAGNOSIS — E785 Hyperlipidemia, unspecified: Secondary | ICD-10-CM | POA: Insufficient documentation

## 2017-01-23 DIAGNOSIS — M7918 Myalgia, other site: Secondary | ICD-10-CM

## 2017-01-23 DIAGNOSIS — K5900 Constipation, unspecified: Secondary | ICD-10-CM | POA: Insufficient documentation

## 2017-01-23 DIAGNOSIS — F329 Major depressive disorder, single episode, unspecified: Secondary | ICD-10-CM | POA: Insufficient documentation

## 2017-01-23 DIAGNOSIS — E669 Obesity, unspecified: Secondary | ICD-10-CM | POA: Insufficient documentation

## 2017-01-23 DIAGNOSIS — Z79891 Long term (current) use of opiate analgesic: Secondary | ICD-10-CM | POA: Diagnosis not present

## 2017-01-23 DIAGNOSIS — Z833 Family history of diabetes mellitus: Secondary | ICD-10-CM | POA: Insufficient documentation

## 2017-01-23 DIAGNOSIS — Z888 Allergy status to other drugs, medicaments and biological substances status: Secondary | ICD-10-CM | POA: Insufficient documentation

## 2017-01-23 DIAGNOSIS — Z818 Family history of other mental and behavioral disorders: Secondary | ICD-10-CM | POA: Insufficient documentation

## 2017-01-23 DIAGNOSIS — M4316 Spondylolisthesis, lumbar region: Secondary | ICD-10-CM | POA: Insufficient documentation

## 2017-01-23 DIAGNOSIS — M488X6 Other specified spondylopathies, lumbar region: Secondary | ICD-10-CM | POA: Insufficient documentation

## 2017-01-23 DIAGNOSIS — G8929 Other chronic pain: Secondary | ICD-10-CM | POA: Diagnosis not present

## 2017-01-23 DIAGNOSIS — M160 Bilateral primary osteoarthritis of hip: Secondary | ICD-10-CM | POA: Insufficient documentation

## 2017-01-23 DIAGNOSIS — M791 Myalgia: Secondary | ICD-10-CM | POA: Insufficient documentation

## 2017-01-23 DIAGNOSIS — Z825 Family history of asthma and other chronic lower respiratory diseases: Secondary | ICD-10-CM | POA: Insufficient documentation

## 2017-01-23 DIAGNOSIS — Z87891 Personal history of nicotine dependence: Secondary | ICD-10-CM | POA: Insufficient documentation

## 2017-01-23 DIAGNOSIS — E210 Primary hyperparathyroidism: Secondary | ICD-10-CM | POA: Diagnosis not present

## 2017-01-23 DIAGNOSIS — I714 Abdominal aortic aneurysm, without rupture: Secondary | ICD-10-CM | POA: Diagnosis not present

## 2017-01-23 DIAGNOSIS — G894 Chronic pain syndrome: Secondary | ICD-10-CM | POA: Diagnosis not present

## 2017-01-23 DIAGNOSIS — R251 Tremor, unspecified: Secondary | ICD-10-CM | POA: Insufficient documentation

## 2017-01-23 DIAGNOSIS — Z96653 Presence of artificial knee joint, bilateral: Secondary | ICD-10-CM | POA: Insufficient documentation

## 2017-01-23 DIAGNOSIS — Z981 Arthrodesis status: Secondary | ICD-10-CM | POA: Insufficient documentation

## 2017-01-23 DIAGNOSIS — F419 Anxiety disorder, unspecified: Secondary | ICD-10-CM | POA: Insufficient documentation

## 2017-01-23 MED ORDER — CYCLOBENZAPRINE HCL 10 MG PO TABS: 10.0000 mg | ORAL_TABLET | Freq: Every day | ORAL | 0 refills | Status: DC

## 2017-01-23 MED ORDER — OXYCODONE HCL 10 MG PO TABS: 10.0000 mg | ORAL_TABLET | Freq: Four times a day (QID) | ORAL | 0 refills | Status: DC | PRN

## 2017-01-23 NOTE — Progress Notes (Signed)
Patient's Name: Brittany Mays  MRN: [id number]  Referring Provider: [name], MD  DOB: Mar 15, 1941  PCP: [name], MD  DOS: 01/23/2017  Note by: Kathlen Brunswick. Dossie Arbour, MD  Service setting: Ambulatory outpatient  Specialty: Interventional Pain Management  Location: [facility] (AMB) Pain Management Facility    Patient type: Established   Primary Reason(s) for Visit: Encounter for prescription drug management & post-procedure evaluation of chronic illness with mild to moderate exacerbation(Level of risk: moderate) CC: Back Pain (low)  HPI  Brittany Mays is a 76 y.o. year old, female patient, who comes today for a post-procedure evaluation and medication management. She has Anxiety and depression; Obesity (BMI 30-39.9); Lumbar spinal stenosis; History of lumbar fusion (T10 through L3 fusion); Generalized weakness; Mechanical complication of internal fixation device such as nail, plate or rod ([place]); Pseudarthrosis after fusion or arthrodesis; Solitary pulmonary nodule; Chronic hip pain (Location of Secondary source of pain) (Left); Hyperlipidemia; Aortic aneurysm ([place]); Osteoarthritis, multiple sites; Chronic low back pain (Location of Primary Source of Pain) (Bilateral) (R>L); Failed back surgical syndrome (x4); Chronic knee pain (Bilateral) (L>R); Long term current use of opiate analgesic; Long term prescription opiate use; Opiate use (60 MME/Day); Encounter for pain management planning; Encounter for therapeutic drug level monitoring; Abnormal CT scan, lumbar spine (05/07/2015); 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 Her primarily concern today is the Back Pain (low)  Pain Assessment: Self-Reported Pain Score: 2 /10             Reported level is compatible with observation.       Pain Type: Chronic pain Pain Location: Back Pain Orientation: Lower, Right Pain Descriptors / Indicators: Sharp Pain Frequency: Constant  [name] was last seen on 12/25/2016 for a procedure. During today's appointment we reviewed Brittany Mays's post-procedure results, as well as her outpatient medication regimen.  Further details on both, my assessment(s), as well as the proposed treatment plan, please see below.  Controlled Substance Pharmacotherapy Assessment REMS (Risk Evaluation and Mitigation Strategy)  Analgesic:Oxycodone IR 10 mg every 6 hours (40 mg/dayof oxycodone) MME/day:60m/day KZenovia Jarred RN  01/23/2017 11:41 AM  Signed Nursing Pain Medication Assessment:  Safety precautions to be maintained throughout the outpatient stay will include: orient to surroundings, keep bed in low position, maintain call bell within reach at all times, provide assistance with transfer out of bed and ambulation.  Medication Inspection Compliance: Pill count conducted under aseptic conditions, in front of the patient. Neither the pills nor the bottle was removed from the patient's sight at any time. Once count was completed pills were immediately returned to the patient in their original bottle.  Medication #1: Oxycodone IR  10 mg Pill/Patch Count: 18 of 120 pills remain Bottle Appearance: Standard pharmacy container. Clearly labeled. Filled Date:02 / 16 / 2018 Last Medication intake:  Today  Medication #2: Oxycodone IR 10 mg Pill/Patch Count: 120 of 120 pills remain Bottle Appearance: Standard pharmacy container. Clearly labeled. Filled Date: 3 / 19/ 2018 Last Medication  intake:  new bottle filled yesterday.  Has not taken any from this bottle   Pharmacokinetics: Liberation and absorption (  onset of action): WNL Distribution (time to peak effect): WNL Metabolism and excretion (duration of action): WNL         Pharmacodynamics: Desired effects: Analgesia: Brittany Mays reports >50% benefit. Functional ability: Patient reports that medication allows her to accomplish basic ADLs Clinically meaningful improvement in function (CMIF): Sustained CMIF goals met Perceived effectiveness: Described as relatively effective, allowing for increase in activities of daily living (ADL) Undesirable effects: Side-effects or Adverse reactions: None reported Monitoring: [place] PMP: Online review of the past 59-monthperiod conducted. Compliant with practice rules and regulations List of all UDS test(s) done:  Lab Results  Component Value Date   SUMMARY FINAL 07/12/2016   Last UDS on record: No results found for: TOXASSSELUR UDS interpretation: Compliant          Medication Assessment Form: Reviewed. Patient indicates being compliant with therapy Treatment compliance: Compliant Risk Assessment Profile: Aberrant behavior: See prior evaluations. None observed or detected today Comorbid factors increasing risk of overdose: See prior notes. No additional risks detected today Risk of substance use disorder (SUD): Low Opioid Risk Tool (ORT) Total Score: 0  Interpretation Table:  Score <3 = Low Risk for SUD  Score between 4-7 = Moderate Risk for SUD  Score >8 = High Risk for Opioid Abuse   Risk Mitigation Strategies:  Patient Counseling: Covered Patient-Prescriber Agreement (PPA): Present and active  Notification to other healthcare providers: Done  Pharmacologic Plan: No change in therapy, at this time  Post-Procedure Assessment  12/25/2016 Procedure: Diagnostic bilateral lumbar facet block #1 under fluoroscopic guidance and IV sedation Post-procedure pain score: 0/10  (100% relief) Influential Factors: BMI: 24.19 kg/m Intra-procedural challenges: None observed Assessment challenges: None detected         Post-procedural side-effects, adverse reactions, or complications: None reported Reported issues: None  Sedation: Sedation provided. When no sedatives are used, the analgesic levels obtained are directly associated to the effectiveness of the local anesthetics. However, when sedation is provided, the level of analgesia obtained during the initial 1 hour following the intervention, is believed to be the result of a combination of factors. These factors may include, but are not limited to: 1. The effectiveness of the local anesthetics used. 2. The effects of the analgesic(s) and/or anxiolytic(s) used. 3. The degree of discomfort experienced by the patient at the time of the procedure. 4. The patients ability and reliability in recalling and recording the events. 5. The presence and influence of possible secondary gains and/or psychosocial factors. Reported result: Relief experienced during the 1st hour after the procedure: 100 % (Ultra-Short Term Relief) Interpretative annotation: Analgesia during this period is likely to be Local Anesthetic and/or IV Sedative (Analgesic/Anxiolitic) related.          Effects of local anesthetic: The analgesic effects attained during this period are directly associated to the localized infiltration of local anesthetics and therefore cary significant diagnostic value as to the etiological location, or anatomical origin, of the pain. Expected duration of relief is directly dependent on the pharmacodynamics of the local anesthetic used. Long-acting (4-6 hours) anesthetics used.  Reported result: Relief during the next 4 to 6 hour after the procedure: 100 % (Short-Term Relief) Interpretative annotation: Complete relief would suggest area to be the source of the pain.          Long-term benefit: Defined as the period of time past  the expected duration of local anesthetics. With the possible exception of prolonged sympathetic blockade from the local anesthetics, benefits during this period are  typically attributed to, or associated with, other factors such as analgesic sensory neuropraxia, antiinflammatory effects, or beneficial biochemical changes provided by agents other than the local anesthetics Reported result: Extended relief following procedure: 90 % (lasting a couple of weeks) (Long-Term Relief) Interpretative annotation: Good relief. This could suggest inflammation to be a significant component in the etiology to the pain.          Current benefits: Defined as persistent relief that continues at this point in time.   Reported results: Treated area: >50 % Ms. Mellen reports improvement in function Interpretative annotation: Ongoing benefits would suggest effective therapeutic approach  Interpretation: Results would suggest a successful diagnostic intervention. Based on these results, the patient may be an excellent candidate for lumbar facet radiofrequency ablation.  Laboratory Chemistry  Inflammation Markers Lab Results  Component Value Date   CRP 0.6 07/12/2016   ESRSEDRATE 6 07/12/2016   (CRP: Acute Phase) (ESR: Chronic Phase) Renal Function Markers Lab Results  Component Value Date   BUN 14 07/12/2016   CREATININE 0.80 10/12/2016   GFRAA >60 07/12/2016   GFRNONAA >60 07/12/2016   Hepatic Function Markers Lab Results  Component Value Date   AST 25 07/12/2016   ALT 18 07/12/2016   ALBUMIN 4.3 07/12/2016   ALKPHOS 69 07/12/2016   HCVAB NEGATIVE 09/07/2015   Electrolytes Lab Results  Component Value Date   NA 140 07/12/2016   K 4.1 07/12/2016   CL 102 07/12/2016   CALCIUM 9.1 07/12/2016   MG 2.0 07/12/2016   Neuropathy Markers Lab Results  Component Value Date   VITAMINB12 1,782 (H) 07/12/2016   Bone Pathology Markers Lab Results  Component Value Date   ALKPHOS 69 07/12/2016    25OHVITD1 35 07/12/2016   25OHVITD2 <1.0 07/12/2016   25OHVITD3 35 07/12/2016   CALCIUM 9.1 07/12/2016   Coagulation Parameters Lab Results  Component Value Date   PLT 168.0 09/07/2015   Cardiovascular Markers Lab Results  Component Value Date   HGB 14.4 09/07/2015   HCT 44.0 09/07/2015   Note: Lab results reviewed.  Recent Diagnostic Imaging Review  Dg C-arm 1-60 Min-no Report  Result Date: 12/11/2016 Fluoroscopy was utilized by the requesting physician.  No radiographic interpretation.   Note: Imaging results reviewed.          Meds  The patient has a current medication list which includes the following prescription(s): vitamin c, cyclobenzaprine, duloxetine, fish oil-omega-3 fatty acids, gabapentin, gabapentin, lubiprostone, melatonin, misc natural products, womens multi, naloxone, oxycodone hcl, oxycodone hcl, oxycodone hcl, polyethylene glycol 3350, primidone, red yeast rice, vitamin b-12, and benefiber.  Current Outpatient Prescriptions on File Prior to Visit  Medication Sig  . Ascorbic Acid (VITAMIN C) 500 MG CAPS Take by mouth daily.  . DULoxetine (CYMBALTA) 60 MG capsule Take 1 capsule (60 mg total) by mouth daily.  . fish oil-omega-3 fatty acids 1000 MG capsule Take 1 g by mouth daily.   Marland Kitchen gabapentin (NEURONTIN) 600 MG tablet 2 (two) times daily. 300 mg in a.m., 600 mg at night  . lubiprostone (AMITIZA) 8 MCG capsule Take 1 capsule (8 mcg total) by mouth 2 (two) times daily with a meal. Swallow the medication whole. Do not break or chew the medication.  . Misc Natural Products (OSTEO BI-FLEX ADV TRIPLE ST PO) Take 1 tablet by mouth daily.   . Multiple Vitamins-Minerals (WOMENS MULTI) CAPS Take one tablet once daily  . naloxone [facility]) 2 MG/2ML injection Inject content of syringe into thigh muscle. Call 911.  Marland Kitchen  Polyethylene Glycol 3350 GRAN   . Red Yeast Rice 600 MG CAPS Take 2 capsules (1,200 mg total) by mouth daily.  . vitamin B-12 (CYANOCOBALAMIN) 100 MCG tablet  Take 1,000 mcg by mouth every other day.   . Wheat Dextrin (BENEFIBER) POWD Stir 2 tsp. TID into 4-8 oz of any non-carbonated beverage or soft food (hot or cold)   No current facility-administered medications on file prior to visit.    ROS  Constitutional: Denies any fever or chills Gastrointestinal: No reported hemesis, hematochezia, vomiting, or acute GI distress Musculoskeletal: Denies any acute onset joint swelling, redness, loss of ROM, or weakness Neurological: No reported episodes of acute onset apraxia, aphasia, dysarthria, agnosia, amnesia, paralysis, loss of coordination, or loss of consciousness  Allergies  Ms. Sundell is allergic to fentanyl; nsaids; and pentothal [thiopental].  PFSH  Drug: Ms. Waites  reports that she does not use drugs. Alcohol:  reports that she does not drink alcohol. Tobacco:  reports that she has been smoking Cigarettes.  She has a 25.00 pack-year smoking history. She has never used smokeless tobacco. Medical:  has a past medical history of Anxiety; Aortic aneurysm ([place]) (06/06/2016); Cataract; Chest pain (08/18/2015); Decreased muscle strength (02/10/2014); Depression; Difficulty in walking(719.7) (02/10/2014); Dysphagia (08/19/2015); Dyspnea (08/19/2015); Hyperparathyroidism, primary ([place]); Hyperventilation (08/18/2015); Insomnia; Intractable back pain (04/29/2014); Kidney stone; Lumbar spinal stenosis; Odynophagia (08/18/2015); Osteoarthritis of both knees; Pain; Rectal prolapse; Unintentional weight loss (09/08/2015); and UTI (lower urinary tract infection) (04/28/2014). Family: family history includes COPD in her father; Diabetes in her son; Heart failure in her father; Mental illness in her mother.  Past Surgical History:  Procedure Laterality Date  . ABDOMINAL HYSTERECTOMY    . APPENDECTOMY    . BACK SURGERY  07-2015   major surgery  . BREAST LUMPECTOMY     Reports this is benign  . colonoscopy  2013  . KNEE SURGERY Bilateral 1990's nd 2002   Knee  Replacements  . PARATHYROIDECTOMY  1990's  . SPINE SURGERY    . TONSILLECTOMY     Constitutional Exam  General appearance: Well nourished, well developed, and well hydrated. In no apparent acute distress Vitals:   01/23/17 1127  BP: 128/61  Pulse: 62  Resp: 18  Temp: 97.9 F (36.6 C)  SpO2: 95%  Weight: 128 lb (58.1 kg)  Height: '5\' 1"'  (1.549 m)   BMI Assessment: Estimated body mass index is 24.19 kg/m as calculated from the following:   Height as of this encounter: '5\' 1"'  (1.549 m).   Weight as of this encounter: 128 lb (58.1 kg).  BMI interpretation table: BMI level Category Range association with higher incidence of chronic pain  <18 kg/m2 Underweight   18.5-24.9 kg/m2 Ideal body weight   25-29.9 kg/m2 Overweight Increased incidence by 20%  30-34.9 kg/m2 Obese (Class I) Increased incidence by 68%  35-39.9 kg/m2 Severe obesity (Class II) Increased incidence by 136%  >40 kg/m2 Extreme obesity (Class III) Increased incidence by 254%   BMI Readings from Last 4 Encounters:  01/23/17 24.19 kg/m  01/10/17 24.30 kg/m  12/11/16 24.56 kg/m  11/13/16 24.61 kg/m   Wt Readings from Last 4 Encounters:  01/23/17 128 lb (58.1 kg)  01/10/17 128 lb 9.6 oz (58.3 kg)  12/11/16 130 lb (59 kg)  11/13/16 126 lb (57.2 kg)  Psych/Mental status: Alert, oriented x 3 (person, place, & time)       Eyes: PERLA Respiratory: No evidence of acute respiratory distress  Cervical Spine Exam  Inspection: No masses,  redness, or swelling Alignment: Symmetrical Functional ROM: Unrestricted ROM Stability: No instability detected Muscle strength & Tone: Functionally intact Sensory: Unimpaired Palpation: Non-contributory  Upper Extremity (UE) Exam    Side: Right upper extremity  Side: Left upper extremity  Inspection: No masses, redness, swelling, or asymmetry. No contractures  Inspection: No masses, redness, swelling, or asymmetry. No contractures  Functional ROM: Unrestricted ROM           Functional ROM: Unrestricted ROM          Muscle strength & Tone: Functionally intact  Muscle strength & Tone: Functionally intact  Sensory: Unimpaired  Sensory: Unimpaired  Palpation: Euthermic  Palpation: Euthermic  Specialized Test(s): Deferred         Specialized Test(s): Deferred          Thoracic Spine Exam  Inspection: No masses, redness, or swelling Alignment: Symmetrical Functional ROM: Unrestricted ROM Stability: No instability detected Sensory: Unimpaired Muscle strength & Tone: Functionally intact Palpation: Non-contributory  Lumbar Spine Exam  Inspection: No masses, redness, or swelling Alignment: Symmetrical Functional ROM: Improved after treatment Stability: No instability detected Muscle strength & Tone: Functionally intact Sensory: Movement-associated pain Palpation: Complains of area being tender to palpation Provocative Tests: Lumbar Hyperextension and rotation test: Positive bilaterally for facet joint pain. Patrick's Maneuver: evaluation deferred today              Gait & Posture Assessment  Ambulation: Unassisted Gait: Relatively normal for age and body habitus Posture: WNL   Lower Extremity Exam    Side: Right lower extremity  Side: Left lower extremity  Inspection: No masses, redness, swelling, or asymmetry. No contractures  Inspection: No masses, redness, swelling, or asymmetry. No contractures  Functional ROM: Unrestricted ROM          Functional ROM: Unrestricted ROM          Muscle strength & Tone: Functionally intact  Muscle strength & Tone: Functionally intact  Sensory: Unimpaired  Sensory: Unimpaired  Palpation: No palpable anomalies  Palpation: No palpable anomalies   Assessment  Primary Diagnosis & Pertinent Problem List: The primary encounter diagnosis was Chronic pain syndrome. Diagnoses of Chronic low back pain (Location of Primary Source of Pain) (Bilateral) (R>L), Chronic hip pain (Location of Secondary source of pain) (Left), Chronic  groin pain (Location of Tertiary source of pain) (Left), Opioid-induced constipation (OIC), Lumbar facet syndrome (Bilateral) (R>L), Lumbar facet arthropathy, Lumbar spondylosis, and Musculoskeletal pain were also pertinent to this visit.  Status Diagnosis  Controlled Controlled Controlled 1. Chronic pain syndrome   2. Chronic low back pain (Location of Primary Source of Pain) (Bilateral) (R>L)   3. Chronic hip pain (Location of Secondary source of pain) (Left)   4. Chronic groin pain (Location of Tertiary source of pain) (Left)   5. Opioid-induced constipation (OIC)   6. Lumbar facet syndrome (Bilateral) (R>L)   7. Lumbar facet arthropathy   8. Lumbar spondylosis   9. Musculoskeletal pain      Plan of Care  Pharmacotherapy (Medications Ordered): Meds ordered this encounter  Medications  . Oxycodone HCl 10 MG TABS    Sig: Take 1 tablet (10 mg total) by mouth every 6 (six) hours as needed.    Dispense:  120 tablet    Refill:  0    Fill one day early if pharmacy is closed on scheduled refill date. Do not fill until: 04/19/17 To last until: 05/19/17  . Oxycodone HCl 10 MG TABS    Sig: Take 1 tablet (10 mg  total) by mouth every 6 (six) hours as needed.    Dispense:  120 tablet    Refill:  0    Patient may have prescription filled one day early if pharmacy is closed on scheduled refill date. Do not fill until: 02/18/17 To last until: 03/20/17  . Oxycodone HCl 10 MG TABS    Sig: Take 1 tablet (10 mg total) by mouth every 6 (six) hours as needed.    Dispense:  120 tablet    Refill:  0    Fill one day early if pharmacy is closed on scheduled refill date. Do not fill until: 03/20/17 To last until: 04/19/17  . cyclobenzaprine (FLEXERIL) 10 MG tablet    Sig: Take 1 tablet (10 mg total) by mouth at bedtime.    Dispense:  120 tablet    Refill:  0    Do not place this medication, or any other prescription from our practice, on "Automatic Refill". Patient may have prescription filled one  day early if pharmacy is closed on scheduled refill date.   New Prescriptions   CYCLOBENZAPRINE (FLEXERIL) 10 MG TABLET    Take 1 tablet (10 mg total) by mouth at bedtime.   Medications administered today: Ms. Boxell had no medications administered during this visit. Lab-work, procedure(s), and/or referral(s): Orders Placed This Encounter  Procedures  . LUMBAR FACET(MEDIAL BRANCH NERVE BLOCK) MBNB  . Radiofrequency,Lumbar   Imaging and/or referral(s): None  Interventional therapies: Planned, scheduled, and/or pending:   Therapeutic bilateral lumbar facet radiofrequency ablation starting with the right side  Palliative bilateral lumbar facet block    Considering:   Diagnostic bilateral lumbar facet block #2 under fluoro and sedation. Diagnostic bilateral thoracolumbar facet block. Possible bilateral thoracolumbar facet radiofrequency ablation.  Diagnostic left intra-articular hip joint injection. Diagnostic left femoral and obturator nerve articular branch blocks.  Possible left femoral and obturator nerve articular branch radiofrequency ablation.  Diagnostic bilateral genicular nerve blocks.  Possible bilateral genicular nerve radiofrequency ablation.  Diagnostic intrathecal pump trial.    Palliative PRN treatment(s):   Palliative bilateral lumbar facet block    Provider-requested follow-up: Return in about 3 months (around 04/25/2017) for (Nurse Practitioner) Med-Mgmt, in addition, procedure (ASAP).  Future Appointments Date Time Provider [place]  01/30/2017 2:45 PM [facility]  03/06/2017 1:00 PM Tyrone Sage, PsyD [facility] None  04/13/2017 2:00 PM [name], MD [facility] None  04/17/2017 1:20 PM Morgan, NP [facility] None   Primary Care Physician: [name], MD Location: [facility] Note by: Kathlen Brunswick. Dossie Arbour, M.D, DABA, DABAPM, DABPM, DABIPP, FIPP Date: 01/23/2017; Time: 4:56  PM  Pain Score Disclaimer: We use the NRS-11 scale. This is a self-reported, subjective measurement of pain severity with only modest accuracy. It is used primarily to identify changes within a particular patient. It must be understood that outpatient pain scales are significantly less accurate that those used for research, where they can be applied under ideal controlled circumstances with minimal exposure to variables. In reality, the score is likely to be a combination of pain intensity and pain affect, where pain affect describes the degree of emotional arousal or changes in action readiness caused by the sensory experience of pain. Factors such as social and work situation, setting, emotional state, anxiety levels, expectation, and prior pain experience may influence pain perception and show large inter-individual differences that may also be affected by time variables.  Patient instructions provided during this appointment: Patient Instructions  Steps  to Quit Smoking Smoking tobacco can be bad for your health. It can also affect almost every organ in your body. Smoking puts you and people around you at risk for many serious long-lasting (chronic) diseases. Quitting smoking is hard, but it is one of the best things that you can do for your health. It is never too late to quit. What are the benefits of quitting smoking? When you quit smoking, you lower your risk for getting serious diseases and conditions. They can include:  Lung cancer or lung disease.  Heart disease.  Stroke.  Heart attack.  Not being able to have children (infertility).  Weak bones (osteoporosis) and broken bones (fractures). If you have coughing, wheezing, and shortness of breath, those symptoms may get better when you quit. You may also get sick less often. If you are pregnant, quitting smoking can help to lower your chances of having a baby of low birth weight. What can I do to help me quit smoking? Talk with your  doctor about what can help you quit smoking. Some things you can do (strategies) include:  Quitting smoking totally, instead of slowly cutting back how much you smoke over a period of time.  Going to in-person counseling. You are more likely to quit if you go to many counseling sessions.  Using resources and support systems, such as:  Online chats with a Social worker.  Phone quitlines.  Printed Furniture conservator/restorer.  Support groups or group counseling.  Text messaging programs.  Mobile phone apps or applications.  Taking medicines. Some of these medicines may have nicotine in them. If you are pregnant or breastfeeding, do not take any medicines to quit smoking unless your doctor says it is okay. Talk with your doctor about counseling or other things that can help you. Talk with your doctor about using more than one strategy at the same time, such as taking medicines while you are also going to in-person counseling. This can help make quitting easier. What things can I do to make it easier to quit? Quitting smoking might feel very hard at first, but there is a lot that you can do to make it easier. Take these steps:  Talk to your family and friends. Ask them to support and encourage you.  Call phone quitlines, reach out to support groups, or work with a Social worker.  Ask people who smoke to not smoke around you.  Avoid places that make you want (trigger) to smoke, such as:  Bars.  Parties.  Smoke-break areas at work.  Spend time with people who do not smoke.  Lower the stress in your life. Stress can make you want to smoke. Try these things to help your stress:  Getting regular exercise.  Deep-breathing exercises.  Yoga.  Meditating.  Doing a body scan. To do this, close your eyes, focus on one area of your body at a time from head to toe, and notice which parts of your body are tense. Try to relax the muscles in those areas.  Download or buy apps on your mobile phone or  tablet that can help you stick to your quit plan. There are many free apps, such as QuitGuide from the [employer] for Disease Control and Prevention). You can find more support from smokefree.gov and other websites. This information is not intended to replace advice given to you by your health care provider. Make sure you discuss any questions you have with your health care provider. Document Released: 08/19/2009 Document Revised: 06/20/2016 Document  Reviewed: 03/09/2015 Elsevier Interactive Patient Education  2017 [employer].  Preparing for Procedure with Sedation Instructions: . Oral Intake: Do not eat or drink anything for at least 8 hours prior to your procedure. . Transportation: Public transportation is not allowed. Bring an adult driver. The driver must be physically present in our waiting room before any procedure can be started. Marland Kitchen Physical Assistance: Bring an adult physically capable of assisting you, in the event you need help. This adult should keep you company at home for at least 6 hours after the procedure. . Blood Pressure Medicine: Take your blood pressure medicine with a sip of water the morning of the procedure. . Blood thinners:  . Diabetics on insulin: Notify the staff so that you can be scheduled 1st case in the morning. If your diabetes requires high dose insulin, take only  of your normal insulin dose the morning of the procedure and notify the staff that you have done so. . Preventing infections: Shower with an antibacterial soap the morning of your procedure. . Build-up your immune system: Take 1000 mg of Vitamin C with every meal (3 times a day) the day prior to your procedure. Marland Kitchen Antibiotics: Inform the staff if you have a condition or reason that requires you to take antibiotics before dental procedures. . Pregnancy: If you are pregnant, call and cancel the procedure. . Sickness: If you have a cold, fever, or any active infections, call and cancel the  procedure. . Arrival: You must be in the facility at least 30 minutes prior to your scheduled procedure. . Children: Do not bring children with you. . Dress appropriately: Bring dark clothing that you would not mind if they get stained. . Valuables: Do not bring any jewelry or valuables. Procedure appointments are reserved for interventional treatments only. Marland Kitchen No Prescription Refills. . No medication changes will be discussed during procedure appointments. . No disability issues will be discussed.  ____________________________________________________________________________________________  Radiofrequency Lesioning Radiofrequency lesioning is a procedure that is performed to relieve pain. The procedure is often used for back, neck, or arm pain. Radiofrequency lesioning involves the use of a machine that creates radio waves to make heat. During the procedure, the heat is applied to the nerve that carries the pain signal. The heat damages the nerve and interferes with the pain signal. Pain relief usually starts about 2 weeks after the procedure and lasts for 6 months to 1 year. Tell a health care provider about:  Any allergies you have.  All medicines you are taking, including vitamins, herbs, eye drops, creams, and over-the-counter medicines.  Any problems you or family members have had with anesthetic medicines.  Any blood disorders you have.  Any surgeries you have had.  Any medical conditions you have.  Whether you are pregnant or may be pregnant. What are the risks? Generally, this is a safe procedure. However, problems may occur, including:  Pain or soreness at the injection site.  Infection at the injection site.  Damage to nerves or blood vessels. What happens before the procedure?  Ask your health care provider about:  Changing or stopping your regular medicines. This is especially important if you are taking diabetes medicines or blood thinners.  Taking medicines  such as aspirin and ibuprofen. These medicines can thin your blood. Do not take these medicines before your procedure if your health care provider instructs you not to.  Follow instructions from your health care provider about eating or drinking restrictions.  Plan to have someone take  you home after the procedure.  If you go home right after the procedure, plan to have someone with you for 24 hours. What happens during the procedure?  You will be given one or more of the following:  A medicine to help you relax (sedative).  A medicine to numb the area (local anesthetic).  You will be awake during the procedure. You will need to be able to talk with the health care provider during the procedure.  With the help of a type of X-ray (fluoroscopy), the health care provider will insert a radiofrequency needle into the area to be treated.  Next, a wire that carries the radio waves (electrode) will be put through the radiofrequency needle. An electrical pulse will be sent through the electrode to verify the correct nerve. You will feel a tingling sensation, and you may have muscle twitching.  Then, the tissue that is around the needle tip will be heated by an electric current that is passed using the radiofrequency machine. This will numb the nerves.  A bandage (dressing) will be put on the insertion area after the procedure is done. The procedure may vary among health care providers and hospitals. What happens after the procedure?  Your blood pressure, heart rate, breathing rate, and blood oxygen level will be monitored often until the medicines you were given have worn off.  Return to your normal activities as directed by your health care provider. This information is not intended to replace advice given to you by your health care provider. Make sure you discuss any questions you have with your health care provider. Document Released: 06/21/2011 Document Revised: 03/30/2016 Document  Reviewed: 11/30/2014 Elsevier Interactive Patient Education  2017 [place] Facet Blocks Patient Information  Description: The facets are joints in the spine between the vertebrae.  Like any joints in the body, facets can become irritated and painful.  Arthritis can also effect the facets.  By injecting steroids and local anesthetic in and around these joints, we can temporarily block the nerve supply to them.  Steroids act directly on irritated nerves and tissues to reduce selling and inflammation which often leads to decreased pain.  Facet blocks may be done anywhere along the spine from the neck to the low back depending upon the location of your pain.   After numbing the skin with local anesthetic (like Novocaine), a small needle is passed onto the facet joints under x-ray guidance.  You may experience a sensation of pressure while this is being done.  The entire block usually lasts about 15-25 minutes.   Conditions which may be treated by facet blocks:   Low back/buttock pain  Neck/shoulder pain  Certain types of headaches  Preparation for the injection:  1. Do not eat any solid food or dairy products within 8 hours of your appointment. 2. You may drink clear liquid up to 3 hours before appointment.  Clear liquids include water, black coffee, juice or soda.  No milk or cream please. 3. You may take your regular medication, including pain medications, with a sip of water before your appointment.  Diabetics should hold regular insulin (if taken separately) and take 1/2 normal NPH dose the morning of the procedure.  Carry some sugar containing items with you to your appointment. 4. A driver must accompany you and be prepared to drive you home after your procedure. 5. Bring all your current medications with you. 6. An IV may be inserted and sedation may be given at the discretion of  the physician. 7. A blood pressure cuff, EKG and other monitors will often be applied during the  procedure.  Some patients may need to have extra oxygen administered for a short period. 8. You will be asked to provide medical information, including your allergies and medications, prior to the procedure.  We must know immediately if you are taking blood thinners (like Coumadin/Warfarin) or if you are allergic to IV iodine contrast (dye).  We must know if you could possible be pregnant.  Possible side-effects:   Bleeding from needle site  Infection (rare, may require surgery)  Nerve injury (rare)  Numbness & tingling (temporary)  Difficulty urinating (rare, temporary)  Spinal headache (a headache worse with upright posture)  Light-headedness (temporary)  Pain at injection site (serveral days)  Decreased blood pressure (rare, temporary)  Weakness in arm/leg (temporary)  Pressure sensation in back/neck (temporary)   Call if you experience:   Fever/chills associated with headache or increased back/neck pain  Headache worsened by an upright position  New onset, weakness or numbness of an extremity below the injection site  Hives or difficulty breathing (go to the emergency room)  Inflammation or drainage at the injection site(s)  Severe back/neck pain greater than usual  New symptoms which are concerning to you  Please note:  Although the local anesthetic injected can often make your back or neck feel good for several hours after the injection, the pain will likely return. It takes 3-7 days for steroids to work.  You may not notice any pain relief for at least one week.  If effective, we will often do a series of 2-3 injections spaced 3-6 weeks apart to maximally decrease your pain.  After the initial series, you may be a candidate for a more permanent nerve block of the facets.  If you have any questions, please call #[phone]) [facility]

## 2017-01-23 NOTE — Progress Notes (Signed)
Nursing Pain Medication Assessment:  Safety precautions to be maintained throughout the outpatient stay will include: orient to surroundings, keep bed in low position, maintain call bell within reach at all times, provide assistance with transfer out of bed and ambulation.  Medication Inspection Compliance: Pill count conducted under aseptic conditions, in front of the patient. Neither the pills nor the bottle was removed from the patient's sight at any time. Once count was completed pills were immediately returned to the patient in their original bottle.  Medication #1: Oxycodone IR  10 mg Pill/Patch Count: 18 of 120 pills remain Bottle Appearance: Standard pharmacy container. Clearly labeled. Filled Date:02 / 16 / 2018 Last Medication intake:  Today  Medication #2: Oxycodone IR 10 mg Pill/Patch Count: 120 of 120 pills remain Bottle Appearance: Standard pharmacy container. Clearly labeled. Filled Date: 3 / 19/ 2018 Last Medication intake:  new bottle filled yesterday.  Has not taken any from this bottle

## 2017-01-23 NOTE — Patient Instructions (Addendum)
Steps to Quit Smoking Smoking tobacco can be bad for your health. It can also affect almost every organ in your body. Smoking puts you and people around you at risk for many serious long-lasting (chronic) diseases. Quitting smoking is hard, but it is one of the best things that you can do for your health. It is never too late to quit. What are the benefits of quitting smoking? When you quit smoking, you lower your risk for getting serious diseases and conditions. They can include:  Lung cancer or lung disease.  Heart disease.  Stroke.  Heart attack.  Not being able to have children (infertility).  Weak bones (osteoporosis) and broken bones (fractures). If you have coughing, wheezing, and shortness of breath, those symptoms may get better when you quit. You may also get sick less often. If you are pregnant, quitting smoking can help to lower your chances of having a baby of low birth weight. What can I do to help me quit smoking? Talk with your doctor about what can help you quit smoking. Some things you can do (strategies) include:  Quitting smoking totally, instead of slowly cutting back how much you smoke over a period of time.  Going to in-person counseling. You are more likely to quit if you go to many counseling sessions.  Using resources and support systems, such as:  Online chats with a counselor.  Phone quitlines.  Printed self-help materials.  Support groups or group counseling.  Text messaging programs.  Mobile phone apps or applications.  Taking medicines. Some of these medicines may have nicotine in them. If you are pregnant or breastfeeding, do not take any medicines to quit smoking unless your doctor says it is okay. Talk with your doctor about counseling or other things that can help you. Talk with your doctor about using more than one strategy at the same time, such as taking medicines while you are also going to in-person counseling. This can help make quitting  easier. What things can I do to make it easier to quit? Quitting smoking might feel very hard at first, but there is a lot that you can do to make it easier. Take these steps:  Talk to your family and friends. Ask them to support and encourage you.  Call phone quitlines, reach out to support groups, or work with a counselor.  Ask people who smoke to not smoke around you.  Avoid places that make you want (trigger) to smoke, such as:  Bars.  Parties.  Smoke-break areas at work.  Spend time with people who do not smoke.  Lower the stress in your life. Stress can make you want to smoke. Try these things to help your stress:  Getting regular exercise.  Deep-breathing exercises.  Yoga.  Meditating.  Doing a body scan. To do this, close your eyes, focus on one area of your body at a time from head to toe, and notice which parts of your body are tense. Try to relax the muscles in those areas.  Download or buy apps on your mobile phone or tablet that can help you stick to your quit plan. There are many free apps, such as QuitGuide from the CDC (Centers for Disease Control and Prevention). You can find more support from smokefree.gov and other websites. This information is not intended to replace advice given to you by your health care provider. Make sure you discuss any questions you have with your health care provider. Document Released: 08/19/2009 Document Revised: 06/20/2016 Document   Reviewed: 03/09/2015 Elsevier Interactive Patient Education  2017 [employer].  Preparing for Procedure with Sedation Instructions: . Oral Intake: Do not eat or drink anything for at least 8 hours prior to your procedure. . Transportation: Public transportation is not allowed. Bring an adult driver. The driver must be physically present in our waiting room before any procedure can be started. Marland Kitchen Physical Assistance: Bring an adult physically capable of assisting you, in the event you need help. This  adult should keep you company at home for at least 6 hours after the procedure. . Blood Pressure Medicine: Take your blood pressure medicine with a sip of water the morning of the procedure. . Blood thinners:  . Diabetics on insulin: Notify the staff so that you can be scheduled 1st case in the morning. If your diabetes requires high dose insulin, take only  of your normal insulin dose the morning of the procedure and notify the staff that you have done so. . Preventing infections: Shower with an antibacterial soap the morning of your procedure. . Build-up your immune system: Take 1000 mg of Vitamin C with every meal (3 times a day) the day prior to your procedure. Marland Kitchen Antibiotics: Inform the staff if you have a condition or reason that requires you to take antibiotics before dental procedures. . Pregnancy: If you are pregnant, call and cancel the procedure. . Sickness: If you have a cold, fever, or any active infections, call and cancel the procedure. . Arrival: You must be in the facility at least 30 minutes prior to your scheduled procedure. . Children: Do not bring children with you. . Dress appropriately: Bring dark clothing that you would not mind if they get stained. . Valuables: Do not bring any jewelry or valuables. Procedure appointments are reserved for interventional treatments only. Marland Kitchen No Prescription Refills. . No medication changes will be discussed during procedure appointments. . No disability issues will be discussed.  ____________________________________________________________________________________________  Radiofrequency Lesioning Radiofrequency lesioning is a procedure that is performed to relieve pain. The procedure is often used for back, neck, or arm pain. Radiofrequency lesioning involves the use of a machine that creates radio waves to make heat. During the procedure, the heat is applied to the nerve that carries the pain signal. The heat damages the nerve and  interferes with the pain signal. Pain relief usually starts about 2 weeks after the procedure and lasts for 6 months to 1 year. Tell a health care provider about:  Any allergies you have.  All medicines you are taking, including vitamins, herbs, eye drops, creams, and over-the-counter medicines.  Any problems you or family members have had with anesthetic medicines.  Any blood disorders you have.  Any surgeries you have had.  Any medical conditions you have.  Whether you are pregnant or may be pregnant. What are the risks? Generally, this is a safe procedure. However, problems may occur, including:  Pain or soreness at the injection site.  Infection at the injection site.  Damage to nerves or blood vessels. What happens before the procedure?  Ask your health care provider about:  Changing or stopping your regular medicines. This is especially important if you are taking diabetes medicines or blood thinners.  Taking medicines such as aspirin and ibuprofen. These medicines can thin your blood. Do not take these medicines before your procedure if your health care provider instructs you not to.  Follow instructions from your health care provider about eating or drinking restrictions.  Plan to have someone take  you home after the procedure.  If you go home right after the procedure, plan to have someone with you for 24 hours. What happens during the procedure?  You will be given one or more of the following:  A medicine to help you relax (sedative).  A medicine to numb the area (local anesthetic).  You will be awake during the procedure. You will need to be able to talk with the health care provider during the procedure.  With the help of a type of X-ray (fluoroscopy), the health care provider will insert a radiofrequency needle into the area to be treated.  Next, a wire that carries the radio waves (electrode) will be put through the radiofrequency needle. An electrical  pulse will be sent through the electrode to verify the correct nerve. You will feel a tingling sensation, and you may have muscle twitching.  Then, the tissue that is around the needle tip will be heated by an electric current that is passed using the radiofrequency machine. This will numb the nerves.  A bandage (dressing) will be put on the insertion area after the procedure is done. The procedure may vary among health care providers and hospitals. What happens after the procedure?  Your blood pressure, heart rate, breathing rate, and blood oxygen level will be monitored often until the medicines you were given have worn off.  Return to your normal activities as directed by your health care provider. This information is not intended to replace advice given to you by your health care provider. Make sure you discuss any questions you have with your health care provider. Document Released: 06/21/2011 Document Revised: 03/30/2016 Document Reviewed: 11/30/2014 Elsevier Interactive Patient Education  2017 [place] F[employer] Blocks Patient Information  Description: The facets are joints in the spine between the vertebrae.  Like any joints in the body, facets can become irritated and painful.  Arthritis can also effect the facets.  By injecting steroids and local anesthetic in and around these joints, we can temporarily block the nerve supply to them.  Steroids act directly on irritated nerves and tissues to reduce selling and inflammation which often leads to decreased pain.  Facet blocks may be done anywhere along the spine from the neck to the low back depending upon the location of your pain.   After numbing the skin with local anesthetic (like Novocaine), a small needle is passed onto the facet joints under x-ray guidance.  You may experience a sensation of pressure while this is being done.  The entire block usually lasts about 15-25 minutes.   Conditions which may be treated by facet  blocks:   Low back/buttock pain  Neck/shoulder pain  Certain types of headaches  Preparation for the injection:  1. Do not eat any solid food or dairy products within 8 hours of your appointment. 2. You may drink clear liquid up to 3 hours before appointment.  Clear liquids include water, black coffee, juice or soda.  No milk or cream please. 3. You may take your regular medication, including pain medications, with a sip of water before your appointment.  Diabetics should hold regular insulin (if taken separately) and take 1/2 normal NPH dose the morning of the procedure.  Carry some sugar containing items with you to your appointment. 4. A driver must accompany you and be prepared to drive you home after your procedure. 5. Bring all your current medications with you. 6. An IV may be inserted and sedation may be given at the discretion  of the physician. 7. A blood pressure cuff, EKG and other monitors will often be applied during the procedure.  Some patients may need to have extra oxygen administered for a short period. 8. You will be asked to provide medical information, including your allergies and medications, prior to the procedure.  We must know immediately if you are taking blood thinners (like Coumadin/Warfarin) or if you are allergic to IV iodine contrast (dye).  We must know if you could possible be pregnant.  Possible side-effects:   Bleeding from needle site  Infection (rare, may require surgery)  Nerve injury (rare)  Numbness & tingling (temporary)  Difficulty urinating (rare, temporary)  Spinal headache (a headache worse with upright posture)  Light-headedness (temporary)  Pain at injection site (serveral days)  Decreased blood pressure (rare, temporary)  Weakness in arm/leg (temporary)  Pressure sensation in back/neck (temporary)   Call if you experience:   Fever/chills associated with headache or increased back/neck pain  Headache worsened by an  upright position  New onset, weakness or numbness of an extremity below the injection site  Hives or difficulty breathing (go to the emergency room)  Inflammation or drainage at the injection site(s)  Severe back/neck pain greater than usual  New symptoms which are concerning to you  Please note:  Although the local anesthetic injected can often make your back or neck feel good for several hours after the injection, the pain will likely return. It takes 3-7 days for steroids to work.  You may not notice any pain relief for at least one week.  If effective, we will often do a series of 2-3 injections spaced 3-6 weeks apart to maximally decrease your pain.  After the initial series, you may be a candidate for a more permanent nerve block of the facets.  If you have any questions, please call #[phone]) [facility]

## 2017-01-24 ENCOUNTER — Other Ambulatory Visit: Payer: Medicare PPO

## 2017-01-25 ENCOUNTER — Telehealth: Payer: Self-pay | Admitting: Pain Medicine

## 2017-01-25 NOTE — Telephone Encounter (Signed)
Patient has question about a medication, please call

## 2017-01-25 NOTE — Telephone Encounter (Signed)
Voicemail left with patient attempting to answer medication question.

## 2017-01-30 ENCOUNTER — Ambulatory Visit
Admission: RE | Admit: 2017-01-30 | Discharge: 2017-01-30 | Disposition: A | Discharge status: Home / Self Care | Payer: Medicare PPO | Source: Ambulatory Visit | Attending: Neurology | Admitting: Neurology

## 2017-02-05 ENCOUNTER — Telehealth: Payer: Self-pay | Admitting: Pain Medicine

## 2017-02-05 NOTE — Telephone Encounter (Signed)
Brittany Mays at [facility] ext [phone] needs more info on Lumbar RFA already performed on 2-19. Did she have it, what levels were done and can she get report sent to her please. [phone] Per vmail left on Friday at 10:53

## 2017-02-07 ENCOUNTER — Other Ambulatory Visit: Payer: Self-pay | Admitting: Family Medicine

## 2017-02-08 NOTE — Progress Notes (Signed)
I have reviewed the annual wellness visit and agree.  Meria Crilly, M.D.  

## 2017-02-12 ENCOUNTER — Other Ambulatory Visit: Payer: Self-pay

## 2017-02-12 NOTE — Telephone Encounter (Signed)
Last OV 01/10/17 filed under historical

## 2017-02-12 NOTE — Telephone Encounter (Signed)
Hey Blanch Media was this taken care of ? If so let me know and ill sign off on msg, thanks KW

## 2017-02-12 NOTE — Telephone Encounter (Signed)
Please see if the patient has been taking this medication. Does not appear to have been refilled in some time.

## 2017-02-12 NOTE — Telephone Encounter (Signed)
This has been taken care of. Thank you Juliann Pulse!

## 2017-02-13 ENCOUNTER — Ambulatory Visit
Admission: RE | Admit: 2017-02-13 | Discharge: 2017-02-13 | Disposition: A | Discharge status: Home / Self Care | Payer: Medicare PPO | Source: Ambulatory Visit | Attending: Neurology | Admitting: Neurology

## 2017-02-13 ENCOUNTER — Telehealth: Payer: Self-pay | Admitting: *Deleted

## 2017-02-13 DIAGNOSIS — G319 Degenerative disease of nervous system, unspecified: Secondary | ICD-10-CM | POA: Insufficient documentation

## 2017-02-13 DIAGNOSIS — G250 Essential tremor: Secondary | ICD-10-CM | POA: Insufficient documentation

## 2017-02-13 MED ORDER — GADOBENATE DIMEGLUMINE 529 MG/ML IV SOLN
11.0000 mL | Freq: Once | INTRAVENOUS | Status: AC | PRN
  Administered 2017-02-13: 11 mL via INTRAVENOUS

## 2017-02-13 MED ORDER — GABAPENTIN 300 MG PO CAPS: ORAL_CAPSULE | ORAL | 3 refills | Status: DC

## 2017-02-13 NOTE — Telephone Encounter (Signed)
Patient states she does take this, she takes 300 in am and 600 at night

## 2017-02-13 NOTE — Telephone Encounter (Signed)
Patient requested a returned call at [phone]

## 2017-02-13 NOTE — Telephone Encounter (Signed)
See refill message

## 2017-02-13 NOTE — Telephone Encounter (Signed)
Left message to return call 

## 2017-02-14 ENCOUNTER — Encounter: Payer: Self-pay | Admitting: Pain Medicine

## 2017-02-14 ENCOUNTER — Ambulatory Visit
Admission: RE | Admit: 2017-02-14 | Discharge: 2017-02-14 | Disposition: A | Discharge status: Home / Self Care | Payer: Medicare PPO | Source: Ambulatory Visit | Attending: Pain Medicine | Admitting: Pain Medicine

## 2017-02-14 ENCOUNTER — Ambulatory Visit (HOSPITAL_BASED_OUTPATIENT_CLINIC_OR_DEPARTMENT_OTHER): Payer: Medicare PPO | Admitting: Pain Medicine

## 2017-02-14 VITALS — BP 110/68 | HR 75 | Temp 98.4°F | Resp 18 | Ht 61.0 in | Wt 128.0 lb

## 2017-02-14 DIAGNOSIS — M488X6 Other specified spondylopathies, lumbar region: Secondary | ICD-10-CM | POA: Diagnosis not present

## 2017-02-14 DIAGNOSIS — M1288 Other specific arthropathies, not elsewhere classified, other specified site: Secondary | ICD-10-CM | POA: Insufficient documentation

## 2017-02-14 DIAGNOSIS — M545 Low back pain: Secondary | ICD-10-CM | POA: Insufficient documentation

## 2017-02-14 DIAGNOSIS — Z884 Allergy status to anesthetic agent status: Secondary | ICD-10-CM | POA: Insufficient documentation

## 2017-02-14 DIAGNOSIS — Z888 Allergy status to other drugs, medicaments and biological substances status: Secondary | ICD-10-CM | POA: Insufficient documentation

## 2017-02-14 DIAGNOSIS — G8918 Other acute postprocedural pain: Secondary | ICD-10-CM | POA: Insufficient documentation

## 2017-02-14 DIAGNOSIS — Z885 Allergy status to narcotic agent status: Secondary | ICD-10-CM | POA: Diagnosis not present

## 2017-02-14 DIAGNOSIS — G8929 Other chronic pain: Secondary | ICD-10-CM

## 2017-02-14 DIAGNOSIS — M47816 Spondylosis without myelopathy or radiculopathy, lumbar region: Secondary | ICD-10-CM

## 2017-02-14 MED ORDER — LIDOCAINE HCL (PF) 1 % IJ SOLN
10.0000 mL | Freq: Once | INTRAMUSCULAR | Status: DC
  Filled 2017-02-14: qty 10

## 2017-02-14 MED ORDER — MIDAZOLAM HCL 5 MG/5ML IJ SOLN
1.0000 mg | INTRAMUSCULAR | Status: DC | PRN
  Administered 2017-02-14: 2 mg via INTRAVENOUS
  Filled 2017-02-14: qty 5

## 2017-02-14 MED ORDER — ROPIVACAINE HCL 2 MG/ML IJ SOLN
9.0000 mL | Freq: Once | INTRAMUSCULAR | Status: AC
  Administered 2017-02-14: 9 mL via PERINEURAL
  Filled 2017-02-14: qty 10

## 2017-02-14 MED ORDER — HYDROCODONE-ACETAMINOPHEN 5-325 MG PO TABS: 1.0000 | ORAL_TABLET | Freq: Three times a day (TID) | ORAL | 0 refills | Status: DC | PRN

## 2017-02-14 MED ORDER — LACTATED RINGERS IV SOLN: 1000.0000 mL | Freq: Once | INTRAVENOUS | Status: DC

## 2017-02-14 MED ORDER — TRIAMCINOLONE ACETONIDE 40 MG/ML IJ SUSP
40.0000 mg | Freq: Once | INTRAMUSCULAR | Status: AC
  Administered 2017-02-14: 40 mg
  Filled 2017-02-14: qty 1

## 2017-02-14 MED ORDER — FENTANYL CITRATE (PF) 100 MCG/2ML IJ SOLN
25.0000 ug | INTRAMUSCULAR | Status: DC | PRN
  Administered 2017-02-14: 100 ug via INTRAVENOUS
  Filled 2017-02-14: qty 2

## 2017-02-14 NOTE — Progress Notes (Signed)
Patient's Name: Brittany Mays  MRN: [id number]  Referring Provider: Milinda Pointer, MD  DOB: 01/18/1941  PCP: [name], MD  DOS: 02/14/2017  Note by: Kathlen Brunswick. Dossie Arbour, MD  Service setting: Ambulatory outpatient  Location: [facility] (AMB) Pain Management Facility  Visit type: Procedure  Specialty: Interventional Pain Management  Patient type: Established   Primary Reason for Visit: Interventional Pain Management Treatment. CC: Back Pain (lower)  Procedure:  Anesthesia, Analgesia, Anxiolysis:  Type: Therapeutic Medial Branch Facet Radiofrequency Ablation Region: Lumbar Level: L2, L3, L4, L5, & S1 Medial Branch Level(s) Laterality: Right-Sided  Type: Local Anesthesia with Moderate (Conscious) Sedation Local Anesthetic: Lidocaine 1% Route: Intravenous (IV) IV Access: Secured Sedation: Meaningful verbal contact was maintained at all times during the procedure  Indication(s): Analgesia and Anxiety  Indications: 1. Lumbar facet syndrome (Bilateral) (R>L)   2. Chronic low back pain (Location of Primary Source of Pain) (Bilateral) (R>L)   3. Lumbar facet arthropathy   4. Lumbar spondylosis   5. Acute postoperative pain    Ms. Boylan has either failed to respond, was unable to tolerate, or simply did not get enough benefit from other more conservative therapies including, but not limited to: 1. Over-the-counter medications 2. Anti-inflammatory medications 3. Muscle relaxants 4. Membrane stabilizers 5. Opioids 6. Physical therapy 7. Modalities (Heat, ice, etc.) 8. Invasive techniques such as nerve blocks. Ms. Fenter has attained more than 50% relief of the pain from a series of diagnostic injections conducted in separate occasions.  Pain Score: Pre-procedure: 2 /10 Post-procedure: 0-No pain/10  Pre-op Assessment:  Previous date of service: 01/23/17 Service provided: Med Refill Ms. Kaleta is a 76 y.o. (year old), female patient, seen today for interventional treatment.  She  has a past surgical history that includes Abdominal hysterectomy; Knee surgery (Bilateral, 1990's nd 2002); Spine surgery; Parathyroidectomy (6568'L); colonoscopy (2013); Appendectomy; Breast lumpectomy; Tonsillectomy; and Back surgery (07-2015). Her primarily concern today is the Back Pain (lower)  Initial Vital Signs: Blood pressure 115/63, pulse 83, temperature 98.4 F (36.9 C), temperature source Oral, resp. rate 16, height 5\' 1"  (1.549 m), weight 128 lb (58.1 kg), SpO2 100 %. BMI: 24.19 kg/m  Risk Assessment: Allergies: Reviewed. She is allergic to fentanyl; nsaids; and pentothal [thiopental].  Allergy Precautions: None required Coagulopathies: "Reviewed. None identified.  Blood-thinner therapy: None at this time Active Infection(s): Reviewed. None identified. Ms. Dancer is afebrile  Site Confirmation: Ms. Grunow was asked to confirm the procedure and laterality before marking the site Procedure checklist: Completed Consent: Before the procedure and under the influence of no sedative(s), amnesic(s), or anxiolytics, the patient was informed of the treatment options, risks and possible complications. To fulfill our ethical and legal obligations, as recommended by the American Medical Association's Code of Ethics, I have informed the patient of my clinical impression; the nature and purpose of the treatment or procedure; the risks, benefits, and possible complications of the intervention; the alternatives, including doing nothing; the risk(s) and benefit(s) of the alternative treatment(s) or procedure(s); and the risk(s) and benefit(s) of doing nothing. The patient was provided information about the general risks and possible complications associated with the procedure. These may include, but are not limited to: failure to achieve desired goals, infection, bleeding, organ or nerve damage, allergic reactions, paralysis, and death. In addition, the patient was informed of those risks and  complications associated to Spine-related procedures, such as failure to decrease pain; infection (i.e.: Meningitis, epidural or intraspinal abscess); bleeding (i.e.: epidural hematoma, subarachnoid hemorrhage, or any other  type of intraspinal or peri-dural bleeding); organ or nerve damage (i.e.: Any type of peripheral nerve, nerve root, or spinal cord injury) with subsequent damage to sensory, motor, and/or autonomic systems, resulting in permanent pain, numbness, and/or weakness of one or several areas of the body; allergic reactions; (i.e.: anaphylactic reaction); and/or death. Furthermore, the patient was informed of those risks and complications associated with the medications. These include, but are not limited to: allergic reactions (i.e.: anaphylactic or anaphylactoid reaction(s)); adrenal axis suppression; blood sugar elevation that in diabetics may result in ketoacidosis or comma; water retention that in patients with history of congestive heart failure may result in shortness of breath, pulmonary edema, and decompensation with resultant heart failure; weight gain; swelling or edema; medication-induced neural toxicity; particulate matter embolism and blood vessel occlusion with resultant organ, and/or nervous system infarction; and/or aseptic necrosis of one or more joints. Finally, the patient was informed that Medicine is not an exact science; therefore, there is also the possibility of unforeseen or unpredictable risks and/or possible complications that may result in a catastrophic outcome. The patient indicated having understood very clearly. We have given the patient no guarantees and we have made no promises. Enough time was given to the patient to ask questions, all of which were answered to the patient's satisfaction. Ms. Ruegg has indicated that she wanted to continue with the procedure. Attestation: I, the ordering provider, attest that I have discussed with the patient the benefits, risks,  side-effects, alternatives, likelihood of achieving goals, and potential problems during recovery for the procedure that I have provided informed consent. Date: 02/14/2017; Time: 11:08 AM  Pre-Procedure Preparation:  Monitoring: As per clinic protocol. Respiration, ETCO2, SpO2, BP, heart rate and rhythm monitor placed and checked for adequate function Safety Precautions: Patient was assessed for positional comfort and pressure points before starting the procedure. Time-out: I initiated and conducted the "Time-out" before starting the procedure, as per protocol. The patient was asked to participate by confirming the accuracy of the "Time Out" information. Verification of the correct person, site, and procedure were performed and confirmed by me, the nursing staff, and the patient. "Time-out" conducted as per Joint Commission's Universal Protocol (UP.01.01.01). "Time-out" Date & Time: 02/14/2017; 1123 hrs.  Description of Procedure Process:   Position: Prone Target Area: For Lumbar Facet blocks, the target is the groove formed by the junction of the transverse process and superior articular process. For the L5 dorsal ramus, the target is the notch between superior articular process and sacral ala. For the S1 dorsal ramus, the target is the superior and lateral edge of the posterior S1 Sacral foramen. Approach: Paraspinal approach. Area Prepped: Entire Posterior Lumbosacral Region Prepping solution: Hibiclens (4.0% Chlorhexidine gluconate solution) Safety Precautions: Aspiration looking for blood return was conducted prior to all injections. At no point did we inject any substances, as a needle was being advanced. No attempts were made at seeking any paresthesias. Safe injection practices and needle disposal techniques used. Medications properly checked for expiration dates. SDV (single dose vial) medications used. Description of the Procedure: Protocol guidelines were followed. The patient was placed in  position over the fluoroscopy table. The target area was identified and the area prepped in the usual manner. Skin desensitized using vapocoolant spray. Skin & deeper tissues infiltrated with local anesthetic. Appropriate amount of time allowed to pass for local anesthetics to take effect. Radiofrequency needles were introduced to the area of the medial branch at the junction of the superior articular process and transverse process  using fluoroscopy. Using the [employer], sensory stimulation using 50 Hz was used to locate & identify the nerve, making sure that the needle was positioned such that there was no sensory stimulation below 0.3 V or above 0.7 V. Stimulation using 2 Hz was used to evaluate the motor component. Care was taken not to lesion any nerves that demonstrated motor stimulation of the lower extremities at an output of less than 2.5 times that of the sensory threshold, or a maximum of 2.0 V. Once satisfactory placement of the needles was achieved, the above solution was slowly injected after negative aspiration. After waiting for at least 2 minutes, the ablation was performed at 80 degrees C for 60 seconds.The needles were then removed and the area cleansed, making sure to leave some of the prepping solution back to take advantage of its long term bactericidal properties. Vitals:   02/14/17 1159 02/14/17 1210 02/14/17 1220 02/14/17 1230  BP: (!) 110/93 (!) 116/58 (!) 115/58 110/68  Pulse: 83 70 74 75  Resp: (!) 21 14 16 18   Temp:      TempSrc:      SpO2: 91% 94% 94% 94%  Weight:      Height:        Start Time: 1123 hrs. End Time: 1159 hrs. Materials & Medications:  Needle(s) Type: Teflon-coated, curved tip, Radiofrequency needle(s) Gauge: 22G Length: 10cm Medication(s): We administered fentaNYL, midazolam, triamcinolone acetonide, and ropivacaine (PF) 2 mg/mL (0.2%). Please see chart orders for dosing details.  Imaging Guidance (Spinal):  Type of Imaging  Technique: Fluoroscopy Guidance (Spinal) Indication(s): Assistance in needle guidance and placement for procedures requiring needle placement in or near specific anatomical locations not easily accessible without such assistance. Exposure Time: Please see nurses notes. Contrast: None used. Fluoroscopic Guidance: I was personally present during the use of fluoroscopy. "Tunnel Vision Technique" used to obtain the best possible view of the target area. Parallax error corrected before commencing the procedure. "Direction-depth-direction" technique used to introduce the needle under continuous pulsed fluoroscopy. Once target was reached, antero-posterior, oblique, and lateral fluoroscopic projection used confirm needle placement in all planes. Images permanently stored in EMR. Interpretation: No contrast injected. I personally interpreted the imaging intraoperatively. Adequate needle placement confirmed in multiple planes. Permanent images saved into the patient's record.  Antibiotic Prophylaxis:  Indication(s): None identified Antibiotic given: None  Post-operative Assessment:  EBL: None Complications: No immediate post-treatment complications observed by team, or reported by patient. Note: The patient tolerated the entire procedure well. A repeat set of vitals were taken after the procedure and the patient was kept under observation following institutional policy, for this type of procedure. Post-procedural neurological assessment was performed, showing return to baseline, prior to discharge. The patient was provided with post-procedure discharge instructions, including a section on how to identify potential problems. Should any problems arise concerning this procedure, the patient was given instructions to immediately contact us, at any time, without hesitation. In any case, we plan to contact the patient by telephone for a follow-up status report regarding this interventional procedure. Comments:  No  additional relevant information.  Plan of Care  Disposition: Discharge home  Discharge Date & Time: 02/14/2017; 1230 hrs.  Physician-requested Follow-up:  Return for opposite side RFA (2-wks from now).  Future Appointments Date Time Provider [place]  03/06/2017 1:00 PM Tyrone Sage, PsyD [facility] None  04/13/2017 2:00 PM [name], MD [facility] None  04/17/2017 1:20 PM Vevelyn Francois, NP [facility] None   Medications  ordered for procedure: Meds ordered this encounter  Medications  . fentaNYL (SUBLIMAZE) injection 25-50 mcg    Make sure Narcan is available in the pyxis when using this medication. In the event of respiratory depression (RR< 8/min): Titrate NARCAN (naloxone) in increments of 0.1 to 0.2 mg IV at 2-3 minute intervals, until desired degree of reversal.  . lactated ringers infusion 1,000 mL  . midazolam (VERSED) 5 MG/5ML injection 1-2 mg    Make sure Flumazenil is available in the pyxis when using this medication. If oversedation occurs, administer 0.2 mg IV over 15 sec. If after 45 sec no response, administer 0.2 mg again over 1 min; may repeat at 1 min intervals; not to exceed 4 doses (1 mg)  . triamcinolone acetonide (KENALOG-40) injection 40 mg  . lidocaine (PF) (XYLOCAINE) 1 % injection 10 mL  . ropivacaine (PF) 2 mg/mL (0.2%) (NAROPIN) injection 9 mL  . DISCONTD: HYDROcodone-acetaminophen (NORCO/VICODIN) 5-325 MG tablet    Sig: Take 1 tablet by mouth every 8 (eight) hours as needed for moderate pain.    Dispense:  30 tablet    Refill:  0    Do not place this medication, or any other prescription from our practice, on "Automatic Refill". Patient may have prescription filled one day early if pharmacy is closed on scheduled refill date. Do not fill until:  To last until:  . HYDROcodone-acetaminophen (NORCO/VICODIN) 5-325 MG tablet    Sig: Take 1 tablet by mouth every 8 (eight) hours as needed for moderate pain.    Dispense:  30 tablet    Refill:  0     Do not place this medication, or any other prescription from our practice, on "Automatic Refill". Patient may have prescription filled one day early if pharmacy is closed on scheduled refill date. Do not fill until: 02/14/17 To last until: 02/24/17   Medications administered: We administered fentaNYL, midazolam, triamcinolone acetonide, and ropivacaine (PF) 2 mg/mL (0.2%).  See the medical record for exact dosing, route, and time of administration.  Lab-work, Procedure(s), & Referral(s) Ordered: Orders Placed This Encounter  Procedures  . DG C-Arm 1-60 Min-No Report  . Discharge instructions  . Follow-up  . Informed Consent Details: Transcribe to consent form and obtain patient signature  . Provider attestation of informed consent for procedure/surgical case  . Verify informed consent   Imaging Ordered: Results for orders placed in visit on 12/11/16  DG C-Arm 1-60 Min-No Report   Narrative Fluoroscopy was utilized by the requesting physician.  No radiographic  interpretation.    New Prescriptions   HYDROCODONE-ACETAMINOPHEN (NORCO/VICODIN) 5-325 MG TABLET    Take 1 tablet by mouth every 8 (eight) hours as needed for moderate pain.   Primary Care Physician: [name], MD Location: [facility] Note by: Kathlen Brunswick. Dossie Arbour, M.D, DABA, DABAPM, DABPM, DABIPP, FIPP Date: 02/14/2017; Time: 1:00 PM  Disclaimer:  Medicine is not an Chief Strategy Officer. The only guarantee in medicine is that nothing is guaranteed. It is important to note that the decision to proceed with this intervention was based on the information collected from the patient. The Data and conclusions were drawn from the patient's questionnaire, the interview, and the physical examination. Because the information was provided in large part by the patient, it cannot be guaranteed that it has not been purposely or unconsciously manipulated. Every effort has been made to obtain as much relevant  data as possible for this evaluation. It is important to note that the conclusions  that lead to this procedure are derived in large part from the available data. Always take into account that the treatment will also be dependent on availability of resources and existing treatment guidelines, considered by other Pain Management Practitioners as being common knowledge and practice, at the time of the intervention. For Medico-Legal purposes, it is also important to point out that variation in procedural techniques and pharmacological choices are the acceptable norm. The indications, contraindications, technique, and results of the above procedure should only be interpreted and judged by a Board-Certified Interventional Pain Specialist with extensive familiarity and expertise in the same exact procedure and technique. Attempts at providing opinions without similar or greater experience and expertise than that of the treating physician will be considered as inappropriate and unethical, and shall result in a formal complaint to the state medical board and applicable specialty societies.  Instructions provided at this appointment: Patient Instructions  Post-Procedure instructions Instructions:  Apply ice: Fill a plastic sandwich bag with crushed ice. Cover it with a small towel and apply to injection site. Apply for 15 minutes then remove x 15 minutes. Repeat sequence on day of procedure, until you go to bed. The purpose is to minimize swelling and discomfort after procedure.  Apply heat: Apply heat to procedure site starting the day following the procedure. The purpose is to treat any soreness and discomfort from the procedure.  Food intake: Start with clear liquids (like water) and advance to regular food, as tolerated.   Physical activities: Keep activities to a minimum for the first 8 hours after the procedure.   Driving: If you have received any sedation, you are not allowed to drive for 24 hours after  your procedure.  Blood thinner: Restart your blood thinner 6 hours after your procedure. (Only for those taking blood thinners)  Insulin: As soon as you can eat, you may resume your normal dosing schedule. (Only for those taking insulin)  Infection prevention: Keep procedure site clean and dry.  Post-procedure Pain Diary: Extremely important that this be done correctly and accurately. Recorded information will be used to determine the next step in treatment.  Pain evaluated is that of treated area only. Do not include pain from an untreated area.  Complete every hour, on the hour, for the initial 8 hours. Set an alarm to help you do this part accurately.  Do not go to sleep and have it completed later. It will not be accurate.  Follow-up appointment: Keep your follow-up appointment after the procedure. Usually 2 weeks for most procedures. (6 weeks in the case of radiofrequency.) Bring you pain diary.  Expect:  From numbing medicine (AKA: Local Anesthetics): Numbness or decrease in pain.  Onset: Full effect within 15 minutes of injected.  Duration: It will depend on the type of local anesthetic used. On the average, 1 to 8 hours.   From steroids: Decrease in swelling or inflammation. Once inflammation is improved, relief of the pain will follow.  Onset of benefits: Depends on the amount of swelling present. The more swelling, the longer it will take for the benefits to be seen.   Duration: Steroids will stay in the system x 2 weeks. Duration of benefits will depend on multiple posibilities including persistent irritating factors.  From procedure: Some discomfort is to be expected once the numbing medicine wears off. This should be minimal if ice and heat are applied as instructed. Call if:  You experience numbness and weakness that gets worse with time, as opposed to wearing  off.  New onset bowel or bladder incontinence. (Spinal procedures only)  Emergency Numbers:  [place]  business hours (Monday - Thursday, 8:00 AM - 4:00 PM) (Friday, 9:00 AM - 12:00 Noon): [phone]  After hours: [phone]  You were given one prescription for Norco today.   __________________________________________________________________________________________

## 2017-02-14 NOTE — Progress Notes (Signed)
Safety precautions to be maintained throughout the outpatient stay will include: orient to surroundings, keep bed in low position, maintain call bell within reach at all times, provide assistance with transfer out of bed and ambulation.  

## 2017-02-14 NOTE — Patient Instructions (Addendum)
Post-Procedure instructions Instructions:  Apply ice: Fill a plastic sandwich bag with crushed ice. Cover it with a small towel and apply to injection site. Apply for 15 minutes then remove x 15 minutes. Repeat sequence on day of procedure, until you go to bed. The purpose is to minimize swelling and discomfort after procedure.  Apply heat: Apply heat to procedure site starting the day following the procedure. The purpose is to treat any soreness and discomfort from the procedure.  Food intake: Start with clear liquids (like water) and advance to regular food, as tolerated.   Physical activities: Keep activities to a minimum for the first 8 hours after the procedure.   Driving: If you have received any sedation, you are not allowed to drive for 24 hours after your procedure.  Blood thinner: Restart your blood thinner 6 hours after your procedure. (Only for those taking blood thinners)  Insulin: As soon as you can eat, you may resume your normal dosing schedule. (Only for those taking insulin)  Infection prevention: Keep procedure site clean and dry.  Post-procedure Pain Diary: Extremely important that this be done correctly and accurately. Recorded information will be used to determine the next step in treatment.  Pain evaluated is that of treated area only. Do not include pain from an untreated area.  Complete every hour, on the hour, for the initial 8 hours. Set an alarm to help you do this part accurately.  Do not go to sleep and have it completed later. It will not be accurate.  Follow-up appointment: Keep your follow-up appointment after the procedure. Usually 2 weeks for most procedures. (6 weeks in the case of radiofrequency.) Bring you pain diary.  Expect:  From numbing medicine (AKA: Local Anesthetics): Numbness or decrease in pain.  Onset: Full effect within 15 minutes of injected.  Duration: It will depend on the type of local anesthetic used. On the average, 1 to 8  hours.   From steroids: Decrease in swelling or inflammation. Once inflammation is improved, relief of the pain will follow.  Onset of benefits: Depends on the amount of swelling present. The more swelling, the longer it will take for the benefits to be seen.   Duration: Steroids will stay in the system x 2 weeks. Duration of benefits will depend on multiple posibilities including persistent irritating factors.  From procedure: Some discomfort is to be expected once the numbing medicine wears off. This should be minimal if ice and heat are applied as instructed. Call if:  You experience numbness and weakness that gets worse with time, as opposed to wearing off.  New onset bowel or bladder incontinence. (Spinal procedures only)  Emergency Numbers:  [facility] business hours (Monday - Thursday, 8:00 AM - 4:00 PM) (Friday, 9:00 AM - 12:00 Noon): [phone]  After hours: [phone]  You were given one prescription for Norco today.   __________________________________________________________________________________________

## 2017-02-15 ENCOUNTER — Telehealth: Payer: Self-pay | Admitting: *Deleted

## 2017-02-15 NOTE — Telephone Encounter (Signed)
Patient called in with some questions re; RF on yesterday.  Patient was concerned as to how much sedation she received on yesterday d/t the amount of time that she slept once arriving home post procedure.  Patient received fentanyl 100 mcg and versed 2 mg which I explained is within the usual dose parameters.  Patient verbalizes u/o information.  Also, patient states that she has complete pain relief at present and wonders what pain may become during the post RF procedure.  Explained how typical next 6 weeks may go and that she may use her additional pain medication that was prescribed on yesterday. Patient restates that she should use her primary pain medication for pain and the additional Rx for breakthrough pain.  Patient verbalizes reassurance and will call back with additional questions.

## 2017-03-05 ENCOUNTER — Telehealth: Payer: Self-pay | Admitting: *Deleted

## 2017-03-05 ENCOUNTER — Telehealth: Payer: Self-pay

## 2017-03-05 NOTE — Telephone Encounter (Signed)
Pt called said she is not about to run out of medications she will be okay until her appointment nurses do not have to call pt thanks

## 2017-03-05 NOTE — Telephone Encounter (Signed)
Please put in an order.

## 2017-03-05 NOTE — Telephone Encounter (Signed)
Patient called and wants to go ahead and get the left side RF done. Right side was done 02-14-17 and she is doing well.  There is no order for it in her chart. Please put in order if it is ok to go ahead and get her request submitted to insurance. Thanks

## 2017-03-06 ENCOUNTER — Ambulatory Visit: Payer: Medicare PPO | Admitting: Psychiatry

## 2017-03-07 ENCOUNTER — Other Ambulatory Visit: Payer: Self-pay | Admitting: Pain Medicine

## 2017-03-07 ENCOUNTER — Telehealth: Payer: Self-pay | Admitting: Pain Medicine

## 2017-03-07 DIAGNOSIS — M545 Low back pain: Principal | ICD-10-CM

## 2017-03-07 DIAGNOSIS — G8929 Other chronic pain: Secondary | ICD-10-CM

## 2017-03-07 DIAGNOSIS — M47816 Spondylosis without myelopathy or radiculopathy, lumbar region: Secondary | ICD-10-CM

## 2017-03-07 NOTE — Telephone Encounter (Signed)
Left lumbar facet RF order entered by Dr Dossie Arbour.

## 2017-03-07 NOTE — Telephone Encounter (Signed)
Patient is still waiting to get her RF scheduled. Please put in the order so I can put her on the schedule.  Thanks

## 2017-03-07 NOTE — Telephone Encounter (Signed)
Patient seems confused about meds and now saying she is about to run out of meds and wants to speak with someone about this, please call

## 2017-03-07 NOTE — Telephone Encounter (Signed)
Spoke with patient and discussed medications and Rx's with her and helped her understand that there were 2 Rx's on one page.  She verbalizes u/o information and that she does have all Rx's accounted for.

## 2017-03-12 NOTE — Telephone Encounter (Signed)
Request for prior authorization has been sent. Waiting on insurance company.

## 2017-04-04 ENCOUNTER — Telehealth: Payer: Self-pay

## 2017-04-04 DIAGNOSIS — I7121 Aneurysm of the ascending aorta, without rupture: Secondary | ICD-10-CM

## 2017-04-04 DIAGNOSIS — I712 Thoracic aortic aneurysm, without rupture: Secondary | ICD-10-CM

## 2017-04-04 NOTE — Telephone Encounter (Signed)
Patient in need of 1 year follow-up appointment with Dr. Genevive Bi with CT Scan without contrast prior to appointment.  Orders placed. CT Chest scheduled at [facility] on 04/20/17 at 0800am. Arrival is 0745.  Follow-up appointment for Dr. Genevive Bi scheduled on 04/20/17 at 1000am. However, patient will come right over from CT Scan at [facility] to be seen. Appointment made.  Patient has been notified of CT and follow-up appointment. She verbalizes understanding of all instructions and directions.

## 2017-04-12 ENCOUNTER — Ambulatory Visit: Payer: Medicare PPO | Admitting: Family Medicine

## 2017-04-13 ENCOUNTER — Ambulatory Visit: Payer: Medicare PPO | Admitting: Family Medicine

## 2017-04-13 ENCOUNTER — Telehealth: Payer: Self-pay | Admitting: Family Medicine

## 2017-04-13 NOTE — Telephone Encounter (Signed)
The patient wanted you to know that she was having severe back pain and hip pain. Therefore, she was unable to walk to get to the appointment that was scheduled today.

## 2017-04-13 NOTE — Telephone Encounter (Signed)
FYI - Pt spouse called and stated that spouse is not doing to well today, she is in a lot of pain and can hardly walk. They will call back to reschedule.

## 2017-04-13 NOTE — Telephone Encounter (Signed)
fyi

## 2017-04-16 ENCOUNTER — Encounter: Payer: Self-pay | Admitting: Pain Medicine

## 2017-04-16 ENCOUNTER — Ambulatory Visit
Admission: RE | Admit: 2017-04-16 | Discharge: 2017-04-16 | Disposition: A | Discharge status: Home / Self Care | Payer: Medicare PPO | Source: Ambulatory Visit | Attending: Pain Medicine | Admitting: Pain Medicine

## 2017-04-16 ENCOUNTER — Ambulatory Visit (HOSPITAL_BASED_OUTPATIENT_CLINIC_OR_DEPARTMENT_OTHER): Payer: Medicare PPO | Admitting: Pain Medicine

## 2017-04-16 VITALS — BP 134/68 | HR 74 | Temp 98.0°F | Resp 14 | Ht 60.0 in | Wt 130.0 lb

## 2017-04-16 DIAGNOSIS — M47816 Spondylosis without myelopathy or radiculopathy, lumbar region: Secondary | ICD-10-CM

## 2017-04-16 DIAGNOSIS — G8929 Other chronic pain: Secondary | ICD-10-CM | POA: Diagnosis not present

## 2017-04-16 DIAGNOSIS — M4696 Unspecified inflammatory spondylopathy, lumbar region: Secondary | ICD-10-CM | POA: Diagnosis present

## 2017-04-16 DIAGNOSIS — G8918 Other acute postprocedural pain: Secondary | ICD-10-CM

## 2017-04-16 DIAGNOSIS — M545 Low back pain: Secondary | ICD-10-CM | POA: Diagnosis not present

## 2017-04-16 MED ORDER — HYDROCODONE-ACETAMINOPHEN 5-325 MG PO TABS: 1.0000 | ORAL_TABLET | Freq: Three times a day (TID) | ORAL | 0 refills | Status: DC | PRN

## 2017-04-16 MED ORDER — ROPIVACAINE HCL 2 MG/ML IJ SOLN
9.0000 mL | Freq: Once | INTRAMUSCULAR | Status: AC
  Administered 2017-04-16: 9 mL via PERINEURAL
  Filled 2017-04-16: qty 10

## 2017-04-16 MED ORDER — LACTATED RINGERS IV SOLN
1000.0000 mL | Freq: Once | INTRAVENOUS | Status: AC
  Administered 2017-04-16: 1000 mL via INTRAVENOUS

## 2017-04-16 MED ORDER — MIDAZOLAM HCL 5 MG/5ML IJ SOLN
1.0000 mg | INTRAMUSCULAR | Status: DC | PRN
  Administered 2017-04-16: 4 mg via INTRAVENOUS
  Filled 2017-04-16: qty 5

## 2017-04-16 MED ORDER — TRIAMCINOLONE ACETONIDE 40 MG/ML IJ SUSP
40.0000 mg | Freq: Once | INTRAMUSCULAR | Status: AC
  Administered 2017-04-16: 40 mg
  Filled 2017-04-16: qty 1

## 2017-04-16 MED ORDER — LIDOCAINE HCL (PF) 1 % IJ SOLN: 10.0000 mL | Freq: Once | INTRAMUSCULAR | Status: DC

## 2017-04-16 MED ORDER — FENTANYL CITRATE (PF) 100 MCG/2ML IJ SOLN: 25.0000 ug | INTRAMUSCULAR | Status: DC | PRN

## 2017-04-16 NOTE — Patient Instructions (Addendum)
____________________________________________________________________________________________  Post-Procedure instructions Instructions:  Apply ice: Fill a plastic sandwich bag with crushed ice. Cover it with a small towel and apply to injection site. Apply for 15 minutes then remove x 15 minutes. Repeat sequence on day of procedure, until you go to bed. The purpose is to minimize swelling and discomfort after procedure.  Apply heat: Apply heat to procedure site starting the day following the procedure. The purpose is to treat any soreness and discomfort from the procedure.  Food intake: Start with clear liquids (like water) and advance to regular food, as tolerated.   Physical activities: Keep activities to a minimum for the first 8 hours after the procedure.   Driving: If you have received any sedation, you are not allowed to drive for 24 hours after your procedure.  Blood thinner: Restart your blood thinner 6 hours after your procedure. (Only for those taking blood thinners)  Insulin: As soon as you can eat, you may resume your normal dosing schedule. (Only for those taking insulin)  Infection prevention: Keep procedure site clean and dry.  Post-procedure Pain Diary: Extremely important that this be done correctly and accurately. Recorded information will be used to determine the next step in treatment.  Pain evaluated is that of treated area only. Do not include pain from an untreated area.  Complete every hour, on the hour, for the initial 8 hours. Set an alarm to help you do this part accurately.  Do not go to sleep and have it completed later. It will not be accurate.  Follow-up appointment: Keep your follow-up appointment after the procedure. Usually 2 weeks for most procedures. (6 weeks in the case of radiofrequency.) Bring you pain diary.  Expect:  From numbing medicine (AKA: Local Anesthetics): Numbness or decrease in pain.  Onset: Full effect within 15 minutes of  injected.  Duration: It will depend on the type of local anesthetic used. On the average, 1 to 8 hours.   From steroids: Decrease in swelling or inflammation. Once inflammation is improved, relief of the pain will follow.  Onset of benefits: Depends on the amount of swelling present. The more swelling, the longer it will take for the benefits to be seen.   Duration: Steroids will stay in the system x 2 weeks. Duration of benefits will depend on multiple posibilities including persistent irritating factors.  From procedure: Some discomfort is to be expected once the numbing medicine wears off. This should be minimal if ice and heat are applied as instructed. Call if:  You experience numbness and weakness that gets worse with time, as opposed to wearing off.  New onset bowel or bladder incontinence. (Spinal procedures only)  Emergency Numbers:  Durning business hours (Monday - Thursday, 8:00 AM - 4:00 PM) (Friday, 9:00 AM - 12:00 Noon): [phone]  After hours: [phone] ____________________________________________________________________________________________  Post RF Discharge Instructions  You have just completed Radiofrequency Neurotomy.  The following instructions will provide you with information and guidelines for self-care upon discharge.  If at any time you have questions or concerns please call your physician.  General Instructions:  Be alert for signs of possible infection: redness, swelling, heat, red streaks, elevated temperature, fever.  Please notify your doctor immediately if you have unusual bleeding, trouble breathing, or loss of the ability to control your bowel or bladder.  What to expect:  Most procedures involve the use of local anesthetics (numbing medicine), steroids (anti-inflammatory medicines) and possibly sedation (relaxation or nerve medicine).  Sedation may affect your  memory, not allowing you to remember the procedure, or the instructions that  we give you after it.  Because of this, you doctor may want to avoid providing you important information after the procedure, since you may not remember.  The doctor will be more than happy to go over the information upon your return.  Local anesthetics, on the other hand, may cause temporary numbness and weakness of the legs or arms, depending on the location of the block.  This numbness/weaknes may last 4-6 hours ( the duration of the local anesthetic).  During this period of numbness, you must be more careful than usual to prevent any injuries to th extremity.  Steroids will begin to work immediately after injected, but on the average, it will take 6-10 days for the swelling to come down to the point where you will be able to tell a difference in terms of the pain.  In summary, you should expect for your pain to get better within 15-20 minutes after the procedure.  This relieve or numbness should last 4-6 hours, after which, it will wear off.  Once it wears off, you may experience more pain than usual for 4-6 days, or until the steroids "kick in".  This discomfort is due to the procedure itself.  To minimize this, we recommend applying ice (fill a plastic sandwich bag with ice and wrap it on a towel to prevent frostbite) to the area, 15 minutes on and 15 minutes off, the day of the procedure.  This will minimize any swelling.  Starting the next day, you should then start with heat (moist or dry, it does not matter).  Heat therapy should continue until the pain improves (4-6 days).  Be careful not to burn yourself.  In the case of Radiofrequency procedures, you should expect more pain than usual for 5 to 6 weeks after the procedure.  This is how long it takes the burned tissue to heal.  We cannot assess any definite results on the success of the procedure until this recovery period has elapsed.  Post-Procedure Care:  You will want to be careful in moving about.  Muscle spasms in the area of the injection  may occur.  Use ice or heat to the area is often helpful.   Occasionally, a spinal headache can develop.  This is different from a normal headache in the sense that it will not go away on it's own, despite normal measures.  If you develop a headache that does not seem to respond to conservative therapy, please let your physician know.  This can be treated with an epidural blood patch.   You may experience some numbness or redness,m however it should be short lived.  If persistent numbness occurs, contact your physician. (Persistent numbness would be defined as lasting more than 4-6 hours).  Use care in moving the effected arm or leg to avoid further injury.  You may be given a sling or crutches to use temporarily.  Some discoloration may be present in your arms or leg for 24-48 hours.  If you experience shortness of breath or if your lips or fingers develop a dusky or "blue" appearance, contact your physician or go to the emergency room.  Diet  No eating limitations, unless stipulated above.  Nevertheless, if you have had sedation, you may experience some nausea.  In this case, it may be wise to wait at least two hours prior to resuming regular diet.  Comfort  You may experience a temporary  increase in pain.  You will also experience tenderness at the site of injection, which may be accompanied with muscle spasms.  Use of cold or heat is usually helpful.  Take your prescription as directed.  Always exercise caution when taking pain pills.  Activity:  For the first 24 hours after the procedure, do not drive a motor vehicle,  Operate heavy machinery or power tools or handle any weapons.  Consider walking with the use of an assistive device or accompanied by an adult for the next 24 hours.  Do not drink alcoholic beverages including beer.  Do not make any important decisions or sign any legal documents. Go home and rest today.  Resume activities tomorrow, as tolerated.  Use caution in moving about as you  may experience mild leg weakness.  Use caution in cooking, use of household electrical appliances and climbing steps.  Medications  May resume pre-procedure medications.  Do not take any drugs, other than what has been prescribed to you.   Other GENERAL RISKS AND COMPLICATIONS  What are the risk, side effects and possible complications? Generally speaking, most procedures are safe.  However, with any procedure there are risks, side effects, and the possibility of complications.  The risks and complications are dependent upon the sites that are lesioned, or the type of nerve block to be performed.  The closer the procedure is to the spine, the more serious the risks are.  Great care is taken when placing the radio frequency needles, block needles or lesioning probes, but sometimes complications can occur. Infection: Any time there is an injection through the skin, there is a risk of infection.  This is why sterile conditions are used for these blocks.  There are four possible types of infection. Localized skin infection. Central Nervous System Infection-This can be in the form of Meningitis, which can be deadly. Epidural Infections-This can be in the form of an epidural abscess, which can cause pressure inside of the spine, causing compression of the spinal cord with subsequent paralysis. This would require an emergency surgery to decompress, and there are no guarantees that the patient would recover from the paralysis. Discitis-This is an infection of the intervertebral discs.  It occurs in about 1% of discography procedures.  It is difficult to treat and it may lead to surgery.        2. Pain: the needles have to go through skin and soft tissues, will cause soreness.       3. Damage to internal structures:  The nerves to be lesioned may be near blood vessels or    other nerves which can be potentially damaged.       4. Bleeding: Bleeding is more common if the patient is taking blood thinners such  as  aspirin, Coumadin, Ticiid, Plavix, etc., or if he/she have some genetic predisposition  such as hemophilia. Bleeding into the spinal canal can cause compression of the spinal  cord with subsequent paralysis.  This would require an emergency surgery to  decompress and there are no guarantees that the patient would recover from the  paralysis.       5. Pneumothorax:  Puncturing of a lung is a possibility, every time a needle is introduced in  the area of the chest or upper back.  Pneumothorax refers to free air around the  collapsed lung(s), inside of the thoracic cavity (chest cavity).  Another two possible  complications related to a similar event would include: Hemothorax and Chylothorax.  These are variations of the Pneumothorax, where instead of air around the collapsed  lung(s), you may have blood or chyle, respectively.       6. Spinal headaches: They may occur with any procedures in the area of the spine.       7. Persistent CSF (Cerebro-Spinal Fluid) leakage: This is a rare problem, but may occur  with prolonged intrathecal or epidural catheters either due to the formation of a fistulous  track or a dural tear.       8. Nerve damage: By working so close to the spinal cord, there is always a possibility of  nerve damage, which could be as serious as a permanent spinal cord injury with  paralysis.       9. Death:  Although rare, severe deadly allergic reactions known as "Anaphylactic  reaction" can occur to any of the medications used.      10. Worsening of the symptoms:  We can always make thing worse.  What are the chances of something like this happening? Chances of any of this occuring are extremely low.  By statistics, you have more of a chance of getting killed in a motor vehicle accident: while driving to the hospital than any of the above occurring .  Nevertheless, you should be aware that they are possibilities.  In general, it is similar to taking a shower.  Everybody knows that you can  slip, hit your head and get killed.  Does that mean that you should not shower again?  Nevertheless always keep in mind that statistics do not mean anything if you happen to be on the wrong side of them.  Even if a procedure has a 1 (one) in a 1,000,000 (million) chance of going wrong, it you happen to be that one..Also, keep in mind that by statistics, you have more of a chance of having something go wrong when taking medications.  Who should not have this procedure? If you are on a blood thinning medication (e.g. Coumadin, Plavix, see list of "Blood Thinners"), or if you have an active infection going on, you should not have the procedure.  If you are taking any blood thinners, please inform your physician.  How should I prepare for this procedure? Do not eat or drink anything at least six hours prior to the procedure. Bring a driver with you .  It cannot be a taxi. Come accompanied by an adult that can drive you back, and that is strong enough to help you if your legs get weak or numb from the local anesthetic. Take all of your medicines the morning of the procedure with just enough water to swallow them. If you have diabetes, make sure that you are scheduled to have your procedure done first thing in the morning, whenever possible. If you have diabetes, take only half of your insulin dose and notify our nurse that you have done so as soon as you arrive at the clinic. If you are diabetic, but only take blood sugar pills (oral hypoglycemic), then do not take them on the morning of your procedure.  You may take them after you have had the procedure. Do not take aspirin or any aspirin-containing medications, at least eleven (11) days prior to the procedure.  They may prolong bleeding. Wear loose fitting clothing that may be easy to take off and that you would not mind if it got stained with Betadine or blood. Do not wear any jewelry or perfume Remove any nail coloring.  It will interfere with some of  our monitoring equipment.  NOTE: Remember that this is not meant to be interpreted as a complete list of all possible complications.  Unforeseen problems may occur.  BLOOD THINNERS The following drugs contain aspirin or other products, which can cause increased bleeding during surgery and should not be taken for 2 weeks prior to and 1 week after surgery.  If you should need take something for relief of minor pain, you may take acetaminophen which is found in Tylenol,m Datril, Anacin-3 and Panadol. It is not blood thinner. The products listed below are.  Do not take any of the products listed below in addition to any listed on your instruction sheet.  A.P.C or A.P.C with Codeine Codeine Phosphate Capsules #3 Ibuprofen Ridaura  ABC compound Congesprin Imuran rimadil  Advil Cope Indocin Robaxisal  Alka-Seltzer Effervescent Pain Reliever and Antacid Coricidin or Coricidin-D  Indomethacin Rufen  Alka-Seltzer plus Cold Medicine Cosprin Ketoprofen S-A-C Tablets  Anacin Analgesic Tablets or Capsules Coumadin Korlgesic Salflex  Anacin Extra Strength Analgesic tablets or capsules CP-2 Tablets Lanoril Salicylate  Anaprox Cuprimine Capsules Levenox Salocol  Anexsia-D Dalteparin Magan Salsalate  Anodynos Darvon compound Magnesium Salicylate Sine-off  Ansaid Dasin Capsules Magsal Sodium Salicylate  Anturane Depen Capsules Marnal Soma  APF Arthritis pain formula Dewitt's Pills Measurin Stanback  Argesic Dia-Gesic Meclofenamic Sulfinpyrazone  Arthritis Bayer Timed Release Aspirin Diclofenac Meclomen Sulindac  Arthritis pain formula Anacin Dicumarol Medipren Supac  Analgesic (Safety coated) Arthralgen Diffunasal Mefanamic Suprofen  Arthritis Strength Bufferin Dihydrocodeine Mepro Compound Suprol  Arthropan liquid Dopirydamole Methcarbomol with Aspirin Synalgos  ASA tablets/Enseals Disalcid Micrainin Tagament  Ascriptin Doan's Midol Talwin  Ascriptin A/D Dolene Mobidin Tanderil  Ascriptin Extra Strength  Dolobid Moblgesic Ticlid  Ascriptin with Codeine Doloprin or Doloprin with Codeine Momentum Tolectin  Asperbuf Duoprin Mono-gesic Trendar  Aspergum Duradyne Motrin or Motrin IB Triminicin  Aspirin plain, buffered or enteric coated Durasal Myochrisine Trigesic  Aspirin Suppositories Easprin Nalfon Trillsate  Aspirin with Codeine Ecotrin Regular or Extra Strength Naprosyn Uracel  Atromid-S Efficin Naproxen Ursinus  Auranofin Capsules Elmiron Neocylate Vanquish  Axotal Emagrin Norgesic Verin  Azathioprine Empirin or Empirin with Codeine Normiflo Vitamin E  Azolid Emprazil Nuprin Voltaren  Bayer Aspirin plain, buffered or children's or timed BC Tablets or powders Encaprin Orgaran Warfarin Sodium  Buff-a-Comp Enoxaparin Orudis Zorpin  Buff-a-Comp with Codeine Equegesic Os-Cal-Gesic   Buffaprin Excedrin plain, buffered or Extra Strength Oxalid   Bufferin Arthritis Strength Feldene Oxphenbutazone   Bufferin plain or Extra Strength Feldene Capsules Oxycodone with Aspirin   Bufferin with Codeine Fenoprofen Fenoprofen Pabalate or Pabalate-SF   Buffets II Flogesic Panagesic   Buffinol plain or Extra Strength Florinal or Florinal with Codeine Panwarfarin   Buf-Tabs Flurbiprofen Penicillamine   Butalbital Compound Four-way cold tablets Penicillin   Butazolidin Fragmin Pepto-Bismol   Carbenicillin Geminisyn Percodan   Carna Arthritis Reliever Geopen Persantine   Carprofen Gold's salt Persistin   Chloramphenicol Goody's Phenylbutazone   Chloromycetin Haltrain Piroxlcam   Clmetidine heparin Plaquenil   Cllnoril Hyco-pap Ponstel   Clofibrate Hydroxy chloroquine Propoxyphen         Before stopping any of these medications, be sure to consult the physician who ordered them.  Some, such as Coumadin (Warfarin) are ordered to prevent or treat serious conditions such as "deep thrombosis", "pumonary embolisms", and other heart problems.  The amount of time that you may need off of the medication may also  vary with the medication and the reason for which  you were taking it.  If you are taking any of these medications, please make sure you notify your pain physician before you undergo any procedures.         Facet Blocks Patient Information  Description: The facets are joints in the spine between the vertebrae.  Like any joints in the body, facets can become irritated and painful.  Arthritis can also effect the facets.  By injecting steroids and local anesthetic in and around these joints, we can temporarily block the nerve supply to them.  Steroids act directly on irritated nerves and tissues to reduce selling and inflammation which often leads to decreased pain.  Facet blocks may be done anywhere along the spine from the neck to the low back depending upon the location of your pain.   After numbing the skin with local anesthetic (like Novocaine), a small needle is passed onto the facet joints under x-ray guidance.  You may experience a sensation of pressure while this is being done.  The entire block usually lasts about 15-25 minutes.   Conditions which may be treated by facet blocks:  Low back/buttock pain Neck/shoulder pain Certain types of headaches  Preparation for the injection:  Do not eat any solid food or dairy products within 8 hours of your appointment. You may drink clear liquid up to 3 hours before appointment.  Clear liquids include water, black coffee, juice or soda.  No milk or cream please. You may take your regular medication, including pain medications, with a sip of water before your appointment.  Diabetics should hold regular insulin (if taken separately) and take 1/2 normal NPH dose the morning of the procedure.  Carry some sugar containing items with you to your appointment. A driver must accompany you and be prepared to drive you home after your procedure. Bring all your current medications with you. An IV may be inserted and sedation may be given at the discretion  of the physician. A blood pressure cuff, EKG and other monitors will often be applied during the procedure.  Some patients may need to have extra oxygen administered for a short period. You will be asked to provide medical information, including your allergies and medications, prior to the procedure.  We must know immediately if you are taking blood thinners (like Coumadin/Warfarin) or if you are allergic to IV iodine contrast (dye).  We must know if you could possible be pregnant.  Possible side-effects:  Bleeding from needle site Infection (rare, may require surgery) Nerve injury (rare) Numbness & tingling (temporary) Difficulty urinating (rare, temporary) Spinal headache (a headache worse with upright posture) Light-headedness (temporary) Pain at injection site (serveral days) Decreased blood pressure (rare, temporary) Weakness in arm/leg (temporary) Pressure sensation in back/neck (temporary)   Call if you experience:  Fever/chills associated with headache or increased back/neck pain Headache worsened by an upright position New onset, weakness or numbness of an extremity below the injection site Hives or difficulty breathing (go to the emergency room) Inflammation or drainage at the injection site(s) Severe back/neck pain greater than usual New symptoms which are concerning to you  Please note:  Although the local anesthetic injected can often make your back or neck feel good for several hours after the injection, the pain will likely return. It takes 3-7 days for steroids to work.  You may not notice any pain relief for at least one week.  If effective, we will often do a series of 2-3 injections spaced 3-6 weeks apart to maximally decrease your  pain.  After the initial series, you may be a candidate for a more permanent nerve block of the facets.  If you have any questions, please call #[phone] [facility]   Hydrocodone prescription  given

## 2017-04-16 NOTE — Progress Notes (Signed)
Safety precautions to be maintained throughout the outpatient stay will include: orient to surroundings, keep bed in low position, maintain call bell within reach at all times, provide assistance with transfer out of bed and ambulation.  

## 2017-04-16 NOTE — Progress Notes (Signed)
Patient's Name: Brittany Mays  MRN: [id number]  Referring Provider: Milinda Pointer, MD  DOB: 20-May-1941  PCP: [name], MD  DOS: 04/16/2017  Note by: Kathlen Brunswick. Dossie Arbour, MD  Service setting: Ambulatory outpatient  Location: [facility] (AMB) Pain Management Facility  Visit type: Procedure  Specialty: Interventional Pain Management  Patient type: Established   Primary Reason for Visit: Interventional Pain Management Treatment. CC: Back Pain (low) and Hip Pain (left)  Procedure:  Anesthesia, Analgesia, Anxiolysis:  Type: Therapeutic Medial Branch Facet Radiofrequency Ablation Region: Lumbar Level: L2, L3, L4, L5, & S1 Medial Branch Level(s) Laterality: Left-Sided  Type: Local Anesthesia with Moderate (Conscious) Sedation Local Anesthetic: Lidocaine 1% Route: Intravenous (IV) IV Access: Secured Sedation: Meaningful verbal contact was maintained at all times during the procedure  Indication(s): Analgesia and Anxiety  Indications: 1. Lumbar facet syndrome (Bilateral) (R>L)   2. Lumbar spondylosis   3. Lumbar facet arthropathy ([place])   4. Chronic low back pain (Location of Primary Source of Pain) (Bilateral) (R>L)   5. Acute postoperative pain    [name] has either failed to respond, was unable to tolerate, or simply did not get enough benefit from other more conservative therapies including, but not limited to: 1. Over-the-counter medications 2. Anti-inflammatory medications 3. Muscle relaxants 4. Membrane stabilizers 5. Opioids 6. Physical therapy 7. Modalities (Heat, ice, etc.) 8. Invasive techniques such as nerve blocks. Brittany Mays has attained more than 50% relief of the pain from a series of diagnostic injections conducted in separate occasions.  Pain Score: Pre-procedure: 2 /10 Post-procedure: 0-No pain/10  Pre-op Assessment:  Previous date of service: 02/14/17 Service provided: Procedure (right lumbar facet RF) Brittany Mays is a 76 y.o. (year old), female  patient, seen today for interventional treatment. She  has a past surgical history that includes Abdominal hysterectomy; Knee surgery (Bilateral, 1990's nd 2002); Spine surgery; Parathyroidectomy (6967'E); colonoscopy (2013); Appendectomy; Breast lumpectomy; Tonsillectomy; and Back surgery (07-2015). Her primarily concern today is the Back Pain (low) and Hip Pain (left)  Initial Vital Signs: Blood pressure 127/67, pulse 88, temperature 98 F (36.7 C), resp. rate 18, height 5' (1.524 m), weight 130 lb (59 kg), SpO2 98 %. BMI: 25.39 kg/m  Risk Assessment: Allergies: Reviewed. She is allergic to fentanyl; nsaids; and pentothal [thiopental].  Allergy Precautions: None required Coagulopathies: Reviewed. None identified.  Blood-thinner therapy: None at this time Active Infection(s): Reviewed. None identified. Brittany Mays is afebrile  Site Confirmation: Brittany Mays was asked to confirm the procedure and laterality before marking the site Procedure checklist: Completed Consent: Before the procedure and under the influence of no sedative(s), amnesic(s), or anxiolytics, the patient was informed of the treatment options, risks and possible complications. To fulfill our ethical and legal obligations, as recommended by the American Medical Association's Code of Ethics, I have informed the patient of my clinical impression; the nature and purpose of the treatment or procedure; the risks, benefits, and possible complications of the intervention; the alternatives, including doing nothing; the risk(s) and benefit(s) of the alternative treatment(s) or procedure(s); and the risk(s) and benefit(s) of doing nothing. The patient was provided information about the general risks and possible complications associated with the procedure. These may include, but are not limited to: failure to achieve desired goals, infection, bleeding, organ or nerve damage, allergic reactions, paralysis, and death. In addition, the patient  was informed of those risks and complications associated to Spine-related procedures, such as failure to decrease pain; infection (i.e.: Meningitis, epidural or intraspinal abscess); bleeding (  i.e.: epidural hematoma, subarachnoid hemorrhage, or any other type of intraspinal or peri-dural bleeding); organ or nerve damage (i.e.: Any type of peripheral nerve, nerve root, or spinal cord injury) with subsequent damage to sensory, motor, and/or autonomic systems, resulting in permanent pain, numbness, and/or weakness of one or several areas of the body; allergic reactions; (i.e.: anaphylactic reaction); and/or death. Furthermore, the patient was informed of those risks and complications associated with the medications. These include, but are not limited to: allergic reactions (i.e.: anaphylactic or anaphylactoid reaction(s)); adrenal axis suppression; blood sugar elevation that in diabetics may result in ketoacidosis or comma; water retention that in patients with history of congestive heart failure may result in shortness of breath, pulmonary edema, and decompensation with resultant heart failure; weight gain; swelling or edema; medication-induced neural toxicity; particulate matter embolism and blood vessel occlusion with resultant organ, and/or nervous system infarction; and/or aseptic necrosis of one or more joints. Finally, the patient was informed that Medicine is not an exact science; therefore, there is also the possibility of unforeseen or unpredictable risks and/or possible complications that may result in a catastrophic outcome. The patient indicated having understood very clearly. We have given the patient no guarantees and we have made no promises. Enough time was given to the patient to ask questions, all of which were answered to the patient's satisfaction. Brittany Mays has indicated that she wanted to continue with the procedure. Attestation: I, the ordering provider, attest that I have discussed with  the patient the benefits, risks, side-effects, alternatives, likelihood of achieving goals, and potential problems during recovery for the procedure that I have provided informed consent. Date: 04/16/2017; Time: 10:40 AM  Pre-Procedure Preparation:  Monitoring: As per clinic protocol. Respiration, ETCO2, SpO2, BP, heart rate and rhythm monitor placed and checked for adequate function Safety Precautions: Patient was assessed for positional comfort and pressure points before starting the procedure. Time-out: I initiated and conducted the "Time-out" before starting the procedure, as per protocol. The patient was asked to participate by confirming the accuracy of the "Time Out" information. Verification of the correct person, site, and procedure were performed and confirmed by me, the nursing staff, and the patient. "Time-out" conducted as per Joint Commission's Universal Protocol (UP.01.01.01). "Time-out" Date & Time: 04/16/2017;   hrs.  Description of Procedure Process:   Position: Prone Target Area: For Lumbar Facet blocks, the target is the groove formed by the junction of the transverse process and superior articular process. For the L5 dorsal ramus, the target is the notch between superior articular process and sacral ala. For the S1 dorsal ramus, the target is the superior and lateral edge of the posterior S1 Sacral foramen. Approach: Paraspinal approach. Area Prepped: Entire Posterior Lumbosacral Region Prepping solution: Hibiclens (4.0% Chlorhexidine gluconate solution) Safety Precautions: Aspiration looking for blood return was conducted prior to all injections. At no point did we inject any substances, as a needle was being advanced. No attempts were made at seeking any paresthesias. Safe injection practices and needle disposal techniques used. Medications properly checked for expiration dates. SDV (single dose vial) medications used. Description of the Procedure: Protocol guidelines were  followed. The patient was placed in position over the fluoroscopy table. The target area was identified and the area prepped in the usual manner. Skin desensitized using vapocoolant spray. Skin & deeper tissues infiltrated with local anesthetic. Appropriate amount of time allowed to pass for local anesthetics to take effect. Radiofrequency needles were introduced to the area of the medial branch at the  junction of the superior articular process and transverse process using fluoroscopy. Using the [employer], sensory stimulation using 50 Hz was used to locate & identify the nerve, making sure that the needle was positioned such that there was no sensory stimulation below 0.3 V or above 0.7 V. Stimulation using 2 Hz was used to evaluate the motor component. Care was taken not to lesion any nerves that demonstrated motor stimulation of the lower extremities at an output of less than 2.5 times that of the sensory threshold, or a maximum of 2.0 V. Once satisfactory placement of the needles was achieved, the above solution was slowly injected after negative aspiration. After waiting for at least 2 minutes, the ablation was performed at 80 degrees C for 60 seconds.The needles were then removed and the area cleansed, making sure to leave some of the prepping solution back to take advantage of its long term bactericidal properties. Vitals:   04/16/17 1303 04/16/17 1313 04/16/17 1323 04/16/17 1333  BP: 125/63 (!) 107/59 127/66 134/68  Pulse:      Resp: 14 (!) 22 20 14   Temp:  98.4 F (36.9 C)  98 F (36.7 C)  SpO2: 100% 98% 95% 94%  Weight:      Height:        Start Time:   hrs. End Time: 1301 hrs. Materials & Medications:  Needle(s) Type: Teflon-coated, curved tip, Radiofrequency needle(s) Gauge: 22G Length: 10cm Medication(s): We administered lactated ringers, midazolam, triamcinolone acetonide, and ropivacaine (PF) 2 mg/mL (0.2%). Please see chart orders for dosing  details.  Imaging Guidance (Spinal):  Type of Imaging Technique: Fluoroscopy Guidance (Spinal) Indication(s): Assistance in needle guidance and placement for procedures requiring needle placement in or near specific anatomical locations not easily accessible without such assistance. Exposure Time: Please see nurses notes. Contrast: None used. Fluoroscopic Guidance: I was personally present during the use of fluoroscopy. "Tunnel Vision Technique" used to obtain the best possible view of the target area. Parallax error corrected before commencing the procedure. "Direction-depth-direction" technique used to introduce the needle under continuous pulsed fluoroscopy. Once target was reached, antero-posterior, oblique, and lateral fluoroscopic projection used confirm needle placement in all planes. Images permanently stored in EMR. Interpretation: No contrast injected. I personally interpreted the imaging intraoperatively. Adequate needle placement confirmed in multiple planes. Permanent images saved into the patient's record.  Antibiotic Prophylaxis:  Indication(s): None identified Antibiotic given: None  Post-operative Assessment:  EBL: None Complications: No immediate post-treatment complications observed by team, or reported by patient. Note: The patient tolerated the entire procedure well. A repeat set of vitals were taken after the procedure and the patient was kept under observation following institutional policy, for this type of procedure. Post-procedural neurological assessment was performed, showing return to baseline, prior to discharge. The patient was provided with post-procedure discharge instructions, including a section on how to identify potential problems. Should any problems arise concerning this procedure, the patient was given instructions to immediately contact us, at any time, without hesitation. In any case, we plan to contact the patient by telephone for a follow-up status report  regarding this interventional procedure. Comments:  No additional relevant information.  Plan of Care  Disposition: Discharge home  Discharge Date & Time: 04/16/2017; 1335 hrs.  Physician-requested Follow-up:  Return in about 6 weeks (around 05/28/2017) for post-RF eval, (6 wks), by MD, in addition, Med-Mgmt, by NP.  Future Appointments Date Time Provider [place]  04/20/2017 8:00 AM [facility]  04/20/2017 10:00 AM Nestor Lewandowsky,  MD [facility] None  04/24/2017 11:00 AM Vevelyn Francois, NP [facility] None  04/25/2017 1:15 PM Mcarthur Rossetti, MD [facility] None  05/29/2017 10:00 AM Milinda Pointer, MD [facility] None   Medications ordered for procedure: Meds ordered this encounter  Medications  . lactated ringers infusion 1,000 mL  . midazolam (VERSED) 5 MG/5ML injection 1-2 mg    Make sure Flumazenil is available in the pyxis when using this medication. If oversedation occurs, administer 0.2 mg IV over 15 sec. If after 45 sec no response, administer 0.2 mg again over 1 min; may repeat at 1 min intervals; not to exceed 4 doses (1 mg)  . DISCONTD: fentaNYL (SUBLIMAZE) injection 25-50 mcg    Make sure Narcan is available in the pyxis when using this medication. In the event of respiratory depression (RR< 8/min): Titrate NARCAN (naloxone) in increments of 0.1 to 0.2 mg IV at 2-3 minute intervals, until desired degree of reversal.  . lidocaine (PF) (XYLOCAINE) 1 % injection 10 mL  . triamcinolone acetonide (KENALOG-40) injection 40 mg  . ropivacaine (PF) 2 mg/mL (0.2%) (NAROPIN) injection 9 mL  . HYDROcodone-acetaminophen (NORCO/VICODIN) 5-325 MG tablet    Sig: Take 1 tablet by mouth every 8 (eight) hours as needed for moderate pain.    Dispense:  30 tablet    Refill:  0    Do not place this medication, or any other prescription from our practice, on "Automatic Refill". Patient may have prescription filled one day early if pharmacy is closed on scheduled refill date. Do  not fill until: 04/16/17 To last until: 04/26/17   Medications administered: We administered lactated ringers, midazolam, triamcinolone acetonide, and ropivacaine (PF) 2 mg/mL (0.2%).  See the medical record for exact dosing, route, and time of administration.  Lab-work, Procedure(s), & Referral(s) Ordered: Orders Placed This Encounter  Procedures  . DG C-Arm 1-60 Min-No Report  . Informed Consent Details: Transcribe to consent form and obtain patient signature  . Provider attestation of informed consent for procedure/surgical case  . Verify informed consent  . Discharge instructions  . Follow-up   Imaging Ordered: Results for orders placed in visit on 02/14/17  DG C-Arm 1-60 Min-No Report   Narrative Fluoroscopy was utilized by the requesting physician.  No radiographic  interpretation.    New Prescriptions   No medications on file   Primary Care Physician: [name], MD Location: [facility] Note by: Kathlen Brunswick. Dossie Arbour, M.D, DABA, DABAPM, DABPM, DABIPP, FIPP Date: 04/16/2017; Time: 2:28 PM  Disclaimer:  Medicine is not an Chief Strategy Officer. The only guarantee in medicine is that nothing is guaranteed. It is important to note that the decision to proceed with this intervention was based on the information collected from the patient. The Data and conclusions were drawn from the patient's questionnaire, the interview, and the physical examination. Because the information was provided in large part by the patient, it cannot be guaranteed that it has not been purposely or unconsciously manipulated. Every effort has been made to obtain as much relevant data as possible for this evaluation. It is important to note that the conclusions that lead to this procedure are derived in large part from the available data. Always take into account that the treatment will also be dependent on availability of resources and existing treatment guidelines, considered by  other Pain Management Practitioners as being common knowledge and practice, at the time of the intervention. For Medico-Legal purposes, it is also important to point out that  variation in procedural techniques and pharmacological choices are the acceptable norm. The indications, contraindications, technique, and results of the above procedure should only be interpreted and judged by a Board-Certified Interventional Pain Specialist with extensive familiarity and expertise in the same exact procedure and technique.  Instructions provided at this appointment: Patient Instructions   ____________________________________________________________________________________________  Post-Procedure instructions Instructions:  Apply ice: Fill a plastic sandwich bag with crushed ice. Cover it with a small towel and apply to injection site. Apply for 15 minutes then remove x 15 minutes. Repeat sequence on day of procedure, until you go to bed. The purpose is to minimize swelling and discomfort after procedure.  Apply heat: Apply heat to procedure site starting the day following the procedure. The purpose is to treat any soreness and discomfort from the procedure.  Food intake: Start with clear liquids (like water) and advance to regular food, as tolerated.   Physical activities: Keep activities to a minimum for the first 8 hours after the procedure.   Driving: If you have received any sedation, you are not allowed to drive for 24 hours after your procedure.  Blood thinner: Restart your blood thinner 6 hours after your procedure. (Only for those taking blood thinners)  Insulin: As soon as you can eat, you may resume your normal dosing schedule. (Only for those taking insulin)  Infection prevention: Keep procedure site clean and dry.  Post-procedure Pain Diary: Extremely important that this be done correctly and accurately. Recorded information will be used to determine the next step in treatment.  Pain  evaluated is that of treated area only. Do not include pain from an untreated area.  Complete every hour, on the hour, for the initial 8 hours. Set an alarm to help you do this part accurately.  Do not go to sleep and have it completed later. It will not be accurate.  Follow-up appointment: Keep your follow-up appointment after the procedure. Usually 2 weeks for most procedures. (6 weeks in the case of radiofrequency.) Bring you pain diary.  Expect:  From numbing medicine (AKA: Local Anesthetics): Numbness or decrease in pain.  Onset: Full effect within 15 minutes of injected.  Duration: It will depend on the type of local anesthetic used. On the average, 1 to 8 hours.   From steroids: Decrease in swelling or inflammation. Once inflammation is improved, relief of the pain will follow.  Onset of benefits: Depends on the amount of swelling present. The more swelling, the longer it will take for the benefits to be seen.   Duration: Steroids will stay in the system x 2 weeks. Duration of benefits will depend on multiple posibilities including persistent irritating factors.  From procedure: Some discomfort is to be expected once the numbing medicine wears off. This should be minimal if ice and heat are applied as instructed. Call if:  You experience numbness and weakness that gets worse with time, as opposed to wearing off.  New onset bowel or bladder incontinence. (Spinal procedures only)  Emergency Numbers:  Durning business hours (Monday - Thursday, 8:00 AM - 4:00 PM) (Friday, 9:00 AM - 12:00 Noon): [phone]  After hours: [phone] ____________________________________________________________________________________________  Post RF Discharge Instructions  You have just completed Radiofrequency Neurotomy.  The following instructions will provide you with information and guidelines for self-care upon discharge.  If at any time you have questions or concerns please call  your physician.  General Instructions:  Be alert for signs of possible infection: redness, swelling, heat, red streaks, elevated  temperature, fever.  Please notify your doctor immediately if you have unusual bleeding, trouble breathing, or loss of the ability to control your bowel or bladder.  What to expect:  Most procedures involve the use of local anesthetics (numbing medicine), steroids (anti-inflammatory medicines) and possibly sedation (relaxation or nerve medicine).  Sedation may affect your memory, not allowing you to remember the procedure, or the instructions that we give you after it.  Because of this, you doctor may want to avoid providing you important information after the procedure, since you may not remember.  The doctor will be more than happy to go over the information upon your return.  Local anesthetics, on the other hand, may cause temporary numbness and weakness of the legs or arms, depending on the location of the block.  This numbness/weaknes may last 4-6 hours ( the duration of the local anesthetic).  During this period of numbness, you must be more careful than usual to prevent any injuries to th extremity.  Steroids will begin to work immediately after injected, but on the average, it will take 6-10 days for the swelling to come down to the point where you will be able to tell a difference in terms of the pain.  In summary, you should expect for your pain to get better within 15-20 minutes after the procedure.  This relieve or numbness should last 4-6 hours, after which, it will wear off.  Once it wears off, you may experience more pain than usual for 4-6 days, or until the steroids "kick in".  This discomfort is due to the procedure itself.  To minimize this, we recommend applying ice (fill a plastic sandwich bag with ice and wrap it on a towel to prevent frostbite) to the area, 15 minutes on and 15 minutes off, the day of the procedure.  This will minimize any swelling.   Starting the next day, you should then start with heat (moist or dry, it does not matter).  Heat therapy should continue until the pain improves (4-6 days).  Be careful not to burn yourself.  In the case of Radiofrequency procedures, you should expect more pain than usual for 5 to 6 weeks after the procedure.  This is how long it takes the burned tissue to heal.  We cannot assess any definite results on the success of the procedure until this recovery period has elapsed.  Post-Procedure Care:  You will want to be careful in moving about.  Muscle spasms in the area of the injection may occur.  Use ice or heat to the area is often helpful.   Occasionally, a spinal headache can develop.  This is different from a normal headache in the sense that it will not go away on it's own, despite normal measures.  If you develop a headache that does not seem to respond to conservative therapy, please let your physician know.  This can be treated with an epidural blood patch.   You may experience some numbness or redness,m however it should be short lived.  If persistent numbness occurs, contact your physician. (Persistent numbness would be defined as lasting more than 4-6 hours).  Use care in moving the effected arm or leg to avoid further injury.  You may be given a sling or crutches to use temporarily.  Some discoloration may be present in your arms or leg for 24-48 hours.  If you experience shortness of breath or if your lips or fingers develop a dusky or "blue" appearance, contact your  physician or go to the emergency room.  Diet  No eating limitations, unless stipulated above.  Nevertheless, if you have had sedation, you may experience some nausea.  In this case, it may be wise to wait at least two hours prior to resuming regular diet.  Comfort  You may experience a temporary increase in pain.  You will also experience tenderness at the site of injection, which may be accompanied with muscle spasms.  Use of cold  or heat is usually helpful.  Take your prescription as directed.  Always exercise caution when taking pain pills.  Activity:  For the first 24 hours after the procedure, do not drive a motor vehicle,  Operate heavy machinery or power tools or handle any weapons.  Consider walking with the use of an assistive device or accompanied by an adult for the next 24 hours.  Do not drink alcoholic beverages including beer.  Do not make any important decisions or sign any legal documents. Go home and rest today.  Resume activities tomorrow, as tolerated.  Use caution in moving about as you may experience mild leg weakness.  Use caution in cooking, use of household electrical appliances and climbing steps.  Medications  May resume pre-procedure medications.  Do not take any drugs, other than what has been prescribed to you.   Other GENERAL RISKS AND COMPLICATIONS  What are the risk, side effects and possible complications? Generally speaking, most procedures are safe.  However, with any procedure there are risks, side effects, and the possibility of complications.  The risks and complications are dependent upon the sites that are lesioned, or the type of nerve block to be performed.  The closer the procedure is to the spine, the more serious the risks are.  Great care is taken when placing the radio frequency needles, block needles or lesioning probes, but sometimes complications can occur. Infection: Any time there is an injection through the skin, there is a risk of infection.  This is why sterile conditions are used for these blocks.  There are four possible types of infection. Localized skin infection. Central Nervous System Infection-This can be in the form of Meningitis, which can be deadly. Epidural Infections-This can be in the form of an epidural abscess, which can cause pressure inside of the spine, causing compression of the spinal cord with subsequent paralysis. This would require an emergency  surgery to decompress, and there are no guarantees that the patient would recover from the paralysis. Discitis-This is an infection of the intervertebral discs.  It occurs in about 1% of discography procedures.  It is difficult to treat and it may lead to surgery.        2. Pain: the needles have to go through skin and soft tissues, will cause soreness.       3. Damage to internal structures:  The nerves to be lesioned may be near blood vessels or    other nerves which can be potentially damaged.       4. Bleeding: Bleeding is more common if the patient is taking blood thinners such as  aspirin, Coumadin, Ticiid, Plavix, etc., or if he/she have some genetic predisposition  such as hemophilia. Bleeding into the spinal canal can cause compression of the spinal  cord with subsequent paralysis.  This would require an emergency surgery to  decompress and there are no guarantees that the patient would recover from the  paralysis.       5. Pneumothorax:  Puncturing of a lung  is a possibility, every time a needle is introduced in  the area of the chest or upper back.  Pneumothorax refers to free air around the  collapsed lung(s), inside of the thoracic cavity (chest cavity).  Another two possible  complications related to a similar event would include: Hemothorax and Chylothorax.   These are variations of the Pneumothorax, where instead of air around the collapsed  lung(s), you may have blood or chyle, respectively.       6. Spinal headaches: They may occur with any procedures in the area of the spine.       7. Persistent CSF (Cerebro-Spinal Fluid) leakage: This is a rare problem, but may occur  with prolonged intrathecal or epidural catheters either due to the formation of a fistulous  track or a dural tear.       8. Nerve damage: By working so close to the spinal cord, there is always a possibility of  nerve damage, which could be as serious as a permanent spinal cord injury with  paralysis.       9. Death:   Although rare, severe deadly allergic reactions known as "Anaphylactic  reaction" can occur to any of the medications used.      10. Worsening of the symptoms:  We can always make thing worse.  What are the chances of something like this happening? Chances of any of this occuring are extremely low.  By statistics, you have more of a chance of getting killed in a motor vehicle accident: while driving to the hospital than any of the above occurring .  Nevertheless, you should be aware that they are possibilities.  In general, it is similar to taking a shower.  Everybody knows that you can slip, hit your head and get killed.  Does that mean that you should not shower again?  Nevertheless always keep in mind that statistics do not mean anything if you happen to be on the wrong side of them.  Even if a procedure has a 1 (one) in a 1,000,000 (million) chance of going wrong, it you happen to be that one..Also, keep in mind that by statistics, you have more of a chance of having something go wrong when taking medications.  Who should not have this procedure? If you are on a blood thinning medication (e.g. Coumadin, Plavix, see list of "Blood Thinners"), or if you have an active infection going on, you should not have the procedure.  If you are taking any blood thinners, please inform your physician.  How should I prepare for this procedure? Do not eat or drink anything at least six hours prior to the procedure. Bring a driver with you .  It cannot be a taxi. Come accompanied by an adult that can drive you back, and that is strong enough to help you if your legs get weak or numb from the local anesthetic. Take all of your medicines the morning of the procedure with just enough water to swallow them. If you have diabetes, make sure that you are scheduled to have your procedure done first thing in the morning, whenever possible. If you have diabetes, take only half of your insulin dose and notify our nurse that  you have done so as soon as you arrive at the clinic. If you are diabetic, but only take blood sugar pills (oral hypoglycemic), then do not take them on the morning of your procedure.  You may take them after you have had the procedure. Do not  take aspirin or any aspirin-containing medications, at least eleven (11) days prior to the procedure.  They may prolong bleeding. Wear loose fitting clothing that may be easy to take off and that you would not mind if it got stained with Betadine or blood. Do not wear any jewelry or perfume Remove any nail coloring.  It will interfere with some of our monitoring equipment.  NOTE: Remember that this is not meant to be interpreted as a complete list of all possible complications.  Unforeseen problems may occur.  BLOOD THINNERS The following drugs contain aspirin or other products, which can cause increased bleeding during surgery and should not be taken for 2 weeks prior to and 1 week after surgery.  If you should need take something for relief of minor pain, you may take acetaminophen which is found in Tylenol,m Datril, Anacin-3 and Panadol. It is not blood thinner. The products listed below are.  Do not take any of the products listed below in addition to any listed on your instruction sheet.  A.P.C or A.P.C with Codeine Codeine Phosphate Capsules #3 Ibuprofen Ridaura  ABC compound Congesprin Imuran rimadil  Advil Cope Indocin Robaxisal  Alka-Seltzer Effervescent Pain Reliever and Antacid Coricidin or Coricidin-D  Indomethacin Rufen  Alka-Seltzer plus Cold Medicine Cosprin Ketoprofen S-A-C Tablets  Anacin Analgesic Tablets or Capsules Coumadin Korlgesic Salflex  Anacin Extra Strength Analgesic tablets or capsules CP-2 Tablets Lanoril Salicylate  Anaprox Cuprimine Capsules Levenox Salocol  Anexsia-D Dalteparin Magan Salsalate  Anodynos Darvon compound Magnesium Salicylate Sine-off  Ansaid Dasin Capsules Magsal Sodium Salicylate  Anturane Depen Capsules  Marnal Soma  APF Arthritis pain formula Dewitt's Pills Measurin Stanback  Argesic Dia-Gesic Meclofenamic Sulfinpyrazone  Arthritis Bayer Timed Release Aspirin Diclofenac Meclomen Sulindac  Arthritis pain formula Anacin Dicumarol Medipren Supac  Analgesic (Safety coated) Arthralgen Diffunasal Mefanamic Suprofen  Arthritis Strength Bufferin Dihydrocodeine Mepro Compound Suprol  Arthropan liquid Dopirydamole Methcarbomol with Aspirin Synalgos  ASA tablets/Enseals Disalcid Micrainin Tagament  Ascriptin Doan's Midol Talwin  Ascriptin A/D Dolene Mobidin Tanderil  Ascriptin Extra Strength Dolobid Moblgesic Ticlid  Ascriptin with Codeine Doloprin or Doloprin with Codeine Momentum Tolectin  Asperbuf Duoprin Mono-gesic Trendar  Aspergum Duradyne Motrin or Motrin IB Triminicin  Aspirin plain, buffered or enteric coated Durasal Myochrisine Trigesic  Aspirin Suppositories Easprin Nalfon Trillsate  Aspirin with Codeine Ecotrin Regular or Extra Strength Naprosyn Uracel  Atromid-S Efficin Naproxen Ursinus  Auranofin Capsules Elmiron Neocylate Vanquish  Axotal Emagrin Norgesic Verin  Azathioprine Empirin or Empirin with Codeine Normiflo Vitamin E  Azolid Emprazil Nuprin Voltaren  Bayer Aspirin plain, buffered or children's or timed BC Tablets or powders Encaprin Orgaran Warfarin Sodium  Buff-a-Comp Enoxaparin Orudis Zorpin  Buff-a-Comp with Codeine Equegesic Os-Cal-Gesic   Buffaprin Excedrin plain, buffered or Extra Strength Oxalid   Bufferin Arthritis Strength Feldene Oxphenbutazone   Bufferin plain or Extra Strength Feldene Capsules Oxycodone with Aspirin   Bufferin with Codeine Fenoprofen Fenoprofen Pabalate or Pabalate-SF   Buffets II Flogesic Panagesic   Buffinol plain or Extra Strength Florinal or Florinal with Codeine Panwarfarin   Buf-Tabs Flurbiprofen Penicillamine   Butalbital Compound Four-way cold tablets Penicillin   Butazolidin Fragmin Pepto-Bismol   Carbenicillin Geminisyn Percodan    Carna Arthritis Reliever Geopen Persantine   Carprofen Gold's salt Persistin   Chloramphenicol Goody's Phenylbutazone   Chloromycetin Haltrain Piroxlcam   Clmetidine heparin Plaquenil   Cllnoril Hyco-pap Ponstel   Clofibrate Hydroxy chloroquine Propoxyphen         Before stopping any of these medications, be  sure to consult the physician who ordered them.  Some, such as Coumadin (Warfarin) are ordered to prevent or treat serious conditions such as "deep thrombosis", "pumonary embolisms", and other heart problems.  The amount of time that you may need off of the medication may also vary with the medication and the reason for which you were taking it.  If you are taking any of these medications, please make sure you notify your pain physician before you undergo any procedures.         Facet Blocks Patient Information  Description: The facets are joints in the spine between the vertebrae.  Like any joints in the body, facets can become irritated and painful.  Arthritis can also effect the facets.  By injecting steroids and local anesthetic in and around these joints, we can temporarily block the nerve supply to them.  Steroids act directly on irritated nerves and tissues to reduce selling and inflammation which often leads to decreased pain.  Facet blocks may be done anywhere along the spine from the neck to the low back depending upon the location of your pain.   After numbing the skin with local anesthetic (like Novocaine), a small needle is passed onto the facet joints under x-ray guidance.  You may experience a sensation of pressure while this is being done.  The entire block usually lasts about 15-25 minutes.   Conditions which may be treated by facet blocks:  Low back/buttock pain Neck/shoulder pain Certain types of headaches  Preparation for the injection:  Do not eat any solid food or dairy products within 8 hours of your appointment. You may drink clear liquid up to 3 hours  before appointment.  Clear liquids include water, black coffee, juice or soda.  No milk or cream please. You may take your regular medication, including pain medications, with a sip of water before your appointment.  Diabetics should hold regular insulin (if taken separately) and take 1/2 normal NPH dose the morning of the procedure.  Carry some sugar containing items with you to your appointment. A driver must accompany you and be prepared to drive you home after your procedure. Bring all your current medications with you. An IV may be inserted and sedation may be given at the discretion of the physician. A blood pressure cuff, EKG and other monitors will often be applied during the procedure.  Some patients may need to have extra oxygen administered for a short period. You will be asked to provide medical information, including your allergies and medications, prior to the procedure.  We must know immediately if you are taking blood thinners (like Coumadin/Warfarin) or if you are allergic to IV iodine contrast (dye).  We must know if you could possible be pregnant.  Possible side-effects:  Bleeding from needle site Infection (rare, may require surgery) Nerve injury (rare) Numbness & tingling (temporary) Difficulty urinating (rare, temporary) Spinal headache (a headache worse with upright posture) Light-headedness (temporary) Pain at injection site (serveral days) Decreased blood pressure (rare, temporary) Weakness in arm/leg (temporary) Pressure sensation in back/neck (temporary)   Call if you experience:  Fever/chills associated with headache or increased back/neck pain Headache worsened by an upright position New onset, weakness or numbness of an extremity below the injection site Hives or difficulty breathing (go to the emergency room) Inflammation or drainage at the injection site(s) Severe back/neck pain greater than usual New symptoms which are concerning to you  Please  note:  Although the local anesthetic injected can often make your  back or neck feel good for several hours after the injection, the pain will likely return. It takes 3-7 days for steroids to work.  You may not notice any pain relief for at least one week.  If effective, we will often do a series of 2-3 injections spaced 3-6 weeks apart to maximally decrease your pain.  After the initial series, you may be a candidate for a more permanent nerve block of the facets.  If you have any questions, please call #[phone]) [facility]   Hydrocodone prescription given

## 2017-04-17 ENCOUNTER — Ambulatory Visit: Payer: Medicare PPO | Admitting: Nurse Practitioner

## 2017-04-17 ENCOUNTER — Telehealth: Payer: Self-pay | Admitting: *Deleted

## 2017-04-17 ENCOUNTER — Telehealth: Payer: Self-pay | Admitting: Pain Medicine

## 2017-04-17 NOTE — Telephone Encounter (Signed)
Spoke with Brittany Mays's husband, she was still in bed but he states that she is doing well.

## 2017-04-17 NOTE — Telephone Encounter (Signed)
Has some things she wants to speak with Nurse about yesterdays procedure

## 2017-04-17 NOTE — Telephone Encounter (Signed)
Patient called to let us know that her legs were extremely numb this morning, however they are better now and denies any loss of bowel or bladder.  Patient states she has no pain.  Told patient that I would make a note and that if she has any other concerns to please call and let us know.

## 2017-04-20 ENCOUNTER — Ambulatory Visit
Admission: RE | Admit: 2017-04-20 | Discharge: 2017-04-20 | Disposition: A | Discharge status: Home / Self Care | Payer: Medicare PPO | Source: Ambulatory Visit | Attending: Cardiothoracic Surgery | Admitting: Cardiothoracic Surgery

## 2017-04-20 ENCOUNTER — Ambulatory Visit: Payer: Medicare PPO | Admitting: Cardiothoracic Surgery

## 2017-04-20 DIAGNOSIS — I7121 Aneurysm of the ascending aorta, without rupture: Secondary | ICD-10-CM

## 2017-04-20 DIAGNOSIS — I712 Thoracic aortic aneurysm, without rupture: Secondary | ICD-10-CM | POA: Insufficient documentation

## 2017-04-20 DIAGNOSIS — I700 Atherosclerosis of aorta: Secondary | ICD-10-CM | POA: Diagnosis not present

## 2017-04-24 ENCOUNTER — Encounter: Payer: Self-pay | Admitting: Nurse Practitioner

## 2017-04-24 ENCOUNTER — Ambulatory Visit: Payer: Medicare PPO | Attending: Nurse Practitioner | Admitting: Nurse Practitioner

## 2017-04-24 VITALS — BP 131/77 | HR 114 | Temp 99.2°F | Resp 16 | Ht 61.0 in | Wt 130.0 lb

## 2017-04-24 DIAGNOSIS — E210 Primary hyperparathyroidism: Secondary | ICD-10-CM | POA: Diagnosis not present

## 2017-04-24 DIAGNOSIS — M48062 Spinal stenosis, lumbar region with neurogenic claudication: Secondary | ICD-10-CM | POA: Diagnosis not present

## 2017-04-24 DIAGNOSIS — R251 Tremor, unspecified: Secondary | ICD-10-CM | POA: Insufficient documentation

## 2017-04-24 DIAGNOSIS — M25552 Pain in left hip: Secondary | ICD-10-CM | POA: Insufficient documentation

## 2017-04-24 DIAGNOSIS — F329 Major depressive disorder, single episode, unspecified: Secondary | ICD-10-CM | POA: Diagnosis not present

## 2017-04-24 DIAGNOSIS — M961 Postlaminectomy syndrome, not elsewhere classified: Secondary | ICD-10-CM | POA: Diagnosis not present

## 2017-04-24 DIAGNOSIS — M791 Myalgia: Secondary | ICD-10-CM | POA: Diagnosis not present

## 2017-04-24 DIAGNOSIS — M4316 Spondylolisthesis, lumbar region: Secondary | ICD-10-CM | POA: Diagnosis not present

## 2017-04-24 DIAGNOSIS — M48061 Spinal stenosis, lumbar region without neurogenic claudication: Secondary | ICD-10-CM

## 2017-04-24 DIAGNOSIS — I700 Atherosclerosis of aorta: Secondary | ICD-10-CM | POA: Insufficient documentation

## 2017-04-24 DIAGNOSIS — F17210 Nicotine dependence, cigarettes, uncomplicated: Secondary | ICD-10-CM | POA: Insufficient documentation

## 2017-04-24 DIAGNOSIS — M1610 Unilateral primary osteoarthritis, unspecified hip: Secondary | ICD-10-CM | POA: Diagnosis not present

## 2017-04-24 DIAGNOSIS — F419 Anxiety disorder, unspecified: Secondary | ICD-10-CM | POA: Diagnosis not present

## 2017-04-24 DIAGNOSIS — Z96653 Presence of artificial knee joint, bilateral: Secondary | ICD-10-CM | POA: Insufficient documentation

## 2017-04-24 DIAGNOSIS — E785 Hyperlipidemia, unspecified: Secondary | ICD-10-CM | POA: Insufficient documentation

## 2017-04-24 DIAGNOSIS — M5126 Other intervertebral disc displacement, lumbar region: Secondary | ICD-10-CM | POA: Insufficient documentation

## 2017-04-24 DIAGNOSIS — M545 Low back pain, unspecified: Secondary | ICD-10-CM

## 2017-04-24 DIAGNOSIS — Z79891 Long term (current) use of opiate analgesic: Secondary | ICD-10-CM | POA: Diagnosis not present

## 2017-04-24 DIAGNOSIS — G8929 Other chronic pain: Secondary | ICD-10-CM

## 2017-04-24 DIAGNOSIS — G894 Chronic pain syndrome: Secondary | ICD-10-CM | POA: Insufficient documentation

## 2017-04-24 DIAGNOSIS — M7918 Myalgia, other site: Secondary | ICD-10-CM

## 2017-04-24 MED ORDER — OXYCODONE HCL 10 MG PO TABS: 10.0000 mg | ORAL_TABLET | Freq: Four times a day (QID) | ORAL | 0 refills | Status: DC | PRN

## 2017-04-24 MED ORDER — CYCLOBENZAPRINE HCL 10 MG PO TABS: 10.0000 mg | ORAL_TABLET | Freq: Every day | ORAL | 0 refills | Status: DC

## 2017-04-24 NOTE — Patient Instructions (Signed)
You were given 3 prescriptions for Oxycodone today. A prescription for Flexeril was sent to your pharmacy.

## 2017-04-24 NOTE — Progress Notes (Signed)
Patient's Name: Brittany Mays  MRN: [id number]  Referring Provider: [name], MD  DOB: Apr 13, 1941  PCP: [name], MD  DOS: 04/24/2017  Note by: Vevelyn Francois NP  Service setting: Ambulatory outpatient  Specialty: Interventional Pain Management  Location: [facility] (AMB) Pain Management Facility    Patient type: Established    Primary Reason(s) for Visit: Encounter for prescription drug management (Level of risk: moderate) CC: Back Pain (left, lower)  HPI  Brittany Mays is a 76 y.o. year old, female patient, who comes today for a medication management evaluation. She has Anxiety and depression; Obesity (BMI 30-39.9); Lumbar spinal stenosis; History of lumbar fusion (T10 through L3 fusion); Generalized weakness; Mechanical complication of internal fixation device such as nail, plate or rod ([place]); Pseudarthrosis after fusion or arthrodesis; Solitary pulmonary nodule; Chronic hip pain (Location of Secondary source of pain) (Left); Hyperlipidemia; Aortic aneurysm ([place]); Osteoarthritis, multiple sites; Chronic low back pain (Location of Primary Source of Pain) (Bilateral) (R>L); Failed back surgical syndrome (x4); Chronic knee pain (Bilateral) (L>R); Long term current use of opiate analgesic; Long term prescription opiate use; Opiate use (60 MME/Day); Encounter for pain management planning; Encounter for therapeutic drug level monitoring; Abnormal CT scan, lumbar spine (05/07/2015); Osteoarthritis of hip  (Bilateral) (L>R); Osteoarthritis of sacroiliac joint (Bilateral); Lumbar facet arthropathy ([place]); Lumbar spondylolisthesis; Lumbar foraminal stenosis (Bilateral); History of total bilateral knee replacement; Opioid-induced constipation (OIC); Chronic groin pain (Location of Tertiary source of pain) (Left); Postlaminectomy syndrome of lumbar region; Lumbar facet syndrome (Bilateral) (R>L); Pseudarthrosis following spinal fusion; Lumbar spondylosis; Chronic upper back pain (midline); Chronic pain  syndrome; Tremor; Musculoskeletal pain; and Acute postoperative pain on her problem list. Her primarily concern today is the Back Pain (left, lower)  Pain Assessment: Self-Reported Pain Score: 0-No pain/10             Reported level is compatible with observation.       Pain Type: Chronic pain Pain Location: Back Pain Orientation: Left, Lower Pain Descriptors / Indicators: Nagging Pain Frequency: Intermittent  Brittany Mays was last scheduled for an appointment on Visit date not found for medication management. During today's appointment we reviewed Brittany Mays's chronic pain status, as well as her outpatient medication regimen. She has chronic low back pain. She has radicular symptoms that do down into her thigh. She admits that the left side is greater than the right. She is in need of a hip replacement. She admits that she will be following up with orthopedics for further evaluation. She denies any numbness tingling or weakness.   The patient  reports that she does not use drugs. Her body mass index is 24.56 kg/m.  Further details on both, my assessment(s), as well as the proposed treatment plan, please see below.  Controlled Substance Pharmacotherapy Assessment REMS (Risk Evaluation and Mitigation Strategy)  Analgesic:Oxycodone IR 10 mg every 6 hours (40 mg/dayof oxycodone) MME/day:24m/day Brittany Martins RN  04/24/2017 11:07 AM  Sign at close encounter Nursing Pain Medication Assessment:  Safety precautions to be maintained throughout the outpatient stay will include: orient to surroundings, keep bed in low position, maintain call bell within reach at all times, provide assistance with transfer out of bed and ambulation.  Medication Inspection Compliance: Pill count conducted under aseptic conditions, in front of the patient. Neither the pills nor the bottle was removed from the patient's sight at any time. Once count was completed pills were immediately returned to the patient in  their original bottle.  Medication:  Oxycodone IR Pill/Patch Count: 69 of 120 pills remain Pill/Patch Appearance: Markings consistent with prescribed medication Bottle Appearance: Standard pharmacy container. Clearly labeled. Filled Date06/02/ 2018 Last Medication intake:  Has not taken in 2 days   Returned last prescription for Hydrocoone given post RF. Had some from previous RF, did not get the most recent one filled.   Pharmacokinetics: Liberation and absorption (onset of action): WNL Distribution (time to peak effect): WNL Metabolism and excretion (duration of action): WNL         Pharmacodynamics: Desired effects: Analgesia: Brittany Mays reports >50% benefit. Functional ability: Patient reports that medication allows her to accomplish basic ADLs Clinically meaningful improvement in function (CMIF): Sustained CMIF goals met Perceived effectiveness: Described as relatively effective, allowing for increase in activities of daily living (ADL) Undesirable effects: Side-effects or Adverse reactions: None reported Monitoring: [place] PMP: Online review of the past 44-monthperiod conducted. Compliant with practice rules and regulations List of all UDS test(s) done:  Lab Results  Component Value Date   SUMMARY FINAL 07/12/2016   Last UDS on record: Summary  Date Value Ref Range Status  07/12/2016 FINAL  Final    Comment:    ==================================================================== TOXAS[facility]RE COMP DRUG ANALYSIS,UR ==================================================================== Test                             Result       Flag       Units Drug Present and Declared for Prescription Verification   Oxycodone                      7500         EXPECTED   ng/mg creat   Oxymorphone                    7340         EXPECTED   ng/mg creat   Noroxycodone                   >11765       EXPECTED   ng/mg creat   Noroxymorphone                 918          EXPECTED   ng/mg creat     Sources of oxycodone are scheduled prescription medications.    Oxymorphone, noroxycodone, and noroxymorphone are expected    metabolites of oxycodone. Oxymorphone is also available as a    scheduled prescription medication.   Gabapentin                     PRESENT      EXPECTED   Duloxetine                     PRESENT      EXPECTED   Acetaminophen                  PRESENT      EXPECTED   Diphenhydramine                PRESENT      EXPECTED ==================================================================== Test                      Result    Flag   Units      Ref Range   Creatinine  85               mg/dL      >=20 ==================================================================== Declared Medications:  The flagging and interpretation on this report are based on the  following declared medications.  Unexpected results may arise from  inaccuracies in the declared medications.  **Note: The testing scope of this panel includes these medications:  Diphenhydramine (Tylenol PM)  Duloxetine (Cymbalta)  Gabapentin (Neurontin)  Oxycodone  **Note: The testing scope of this panel does not include small to  moderate amounts of these reported medications:  Acetaminophen (Tylenol PM)  **Note: The testing scope of this panel does not include following  reported medications:  Chondroitin (Glucosamine-Chondroitin)  Cyanocobalamin  Glucosamine (Glucosamine-Chondroitin)  Melatonin  Multivitamin (MVI)  Sennosides (Senna-Lax)  Supplement (Red Yeast Rice)  Vitamin C ==================================================================== For clinical consultation, please call [phone]. ====================================================================    UDS interpretation: Compliant          Medication Assessment Form: Reviewed. Patient indicates being compliant with therapy Treatment compliance: Compliant Risk Assessment Profile: Aberrant behavior: See prior evaluations.  None observed or detected today Comorbid factors increasing risk of overdose: See prior notes. No additional risks detected today Risk of substance use disorder (SUD): Low Opioid Risk Tool (ORT) Total Score:    Interpretation Table:  Score <3 = Low Risk for SUD  Score between 4-7 = Moderate Risk for SUD  Score >8 = High Risk for Opioid Abuse   Risk Mitigation Strategies:  Patient Counseling: Covered Patient-Prescriber Agreement (PPA): Present and active  Notification to other healthcare providers: Done  Pharmacologic Plan: No change in therapy, at this time  Laboratory Chemistry  Inflammation Markers Lab Results  Component Value Date   CRP 0.6 07/12/2016   ESRSEDRATE 6 07/12/2016   (CRP: Acute Phase) (ESR: Chronic Phase) Renal Function Markers Lab Results  Component Value Date   BUN 14 07/12/2016   CREATININE 0.80 10/12/2016   GFRAA >60 07/12/2016   GFRNONAA >60 07/12/2016   Hepatic Function Markers Lab Results  Component Value Date   AST 25 07/12/2016   ALT 18 07/12/2016   ALBUMIN 4.3 07/12/2016   ALKPHOS 69 07/12/2016   HCVAB NEGATIVE 09/07/2015   Electrolytes Lab Results  Component Value Date   NA 140 07/12/2016   K 4.1 07/12/2016   CL 102 07/12/2016   CALCIUM 9.1 07/12/2016   MG 2.0 07/12/2016   Neuropathy Markers Lab Results  Component Value Date   VITAMINB12 1,782 (H) 07/12/2016   Bone Pathology Markers Lab Results  Component Value Date   ALKPHOS 69 07/12/2016   25OHVITD1 35 07/12/2016   25OHVITD2 <1.0 07/12/2016   25OHVITD3 35 07/12/2016   CALCIUM 9.1 07/12/2016   Coagulation Parameters Lab Results  Component Value Date   PLT 168.0 09/07/2015   Cardiovascular Markers Lab Results  Component Value Date   HGB 14.4 09/07/2015   HCT 44.0 09/07/2015   Note: Lab results reviewed.  Recent Diagnostic Imaging Review  Ct Chest Wo Contrast  Result Date: 04/20/2017 CLINICAL DATA:  Ascending aortic aneurysm EXAM: CT CHEST WITHOUT CONTRAST  TECHNIQUE: Multidetector CT imaging of the chest was performed following the standard protocol without IV contrast. COMPARISON:  10/12/2006 FINDINGS: Cardiovascular: The heart is normal in size. No pericardial effusion. Coronary atherosclerosis in left circumflex. 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 No evidence of ascending thoracic aortic aneurysm. Mild atherosclerotic calcifications. Mediastinum/Nodes: Small mediastinal lymph nodes which do not meet pathologic CT size  criteria. Visualized thyroid is unremarkable. Lungs/Pleura: 2 mm left upper lobe pulmonary nodule (series 3/ image 51), unchanged. Stable branching opacity in the right upper lobe favoring mucous plugging (series 3/ image 44). Mild subpleural nodularity in the right upper lobe measuring up to 3 mm (series 3/image 41), unchanged. No new/ suspicious pulmonary nodules. No focal consolidation. Right posterior Bochdalek's hernia. No pleural effusion or pneumothorax. Upper Abdomen: Visualized upper abdomen is unremarkable. Musculoskeletal: Degenerative changes of the visualized thoracolumbar spine. Posterior thoracolumbar fixation at T10-L2. 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 No evidence of acute cardiopulmonary disease. Aortic Atherosclerosis (ICD10-I70.0). Electronically Signed   By: Julian Hy M.D.   On: 04/20/2017 08:33   Note: Imaging results reviewed.          Meds  The patient has a current medication list which includes the following prescription(s): vitamin c, cyclobenzaprine, duloxetine, fish oil-omega-3 fatty acids, gabapentin, hydrocodone-acetaminophen, lubiprostone, melatonin, misc natural products, womens multi, naloxone, oxycodone hcl, polyethylene glycol 3350, primidone, red yeast rice, vitamin b-12, benefiber, oxycodone hcl, oxycodone hcl, and primidone.  Current Outpatient Prescriptions on File Prior to Visit  Medication Sig  .  Ascorbic Acid (VITAMIN C) 500 MG CAPS Take by mouth daily.  . DULoxetine (CYMBALTA) 60 MG capsule TAKE 1 CAPSULE EVERY DAY  . fish oil-omega-3 fatty acids 1000 MG capsule Take 1 g by mouth daily.   Marland Kitchen gabapentin (NEURONTIN) 300 MG capsule Take 300 mg (one tablet) by mouth in the morning and take 600 mg (2 tablets) by mouth at night  . HYDROcodone-acetaminophen (NORCO/VICODIN) 5-325 MG tablet Take 1 tablet by mouth every 8 (eight) hours as needed for moderate pain.  Marland Kitchen lubiprostone (AMITIZA) 8 MCG capsule Take 1 capsule (8 mcg total) by mouth 2 (two) times daily with a meal. Swallow the medication whole. Do not break or chew the medication.  . Melatonin 5 MG TABS Take 1 tablet by mouth at bedtime as needed.   . Misc Natural Products (OSTEO BI-FLEX ADV TRIPLE ST PO) Take 1 tablet by mouth daily.   . Multiple Vitamins-Minerals (WOMENS MULTI) CAPS Take one tablet once daily  . naloxone [facility]) 2 MG/2ML injection Inject content of syringe into thigh muscle. Call 911.  . Polyethylene Glycol 3350 GRAN   . primidone (MYSOLINE) 50 MG tablet Take by mouth 31m (1/2 tablet) nightly for two weeks then 236m(1/2 tablet) two times a day.  . Red Yeast Rice 600 MG CAPS Take 2 capsules (1,200 mg total) by mouth daily.  . vitamin B-12 (CYANOCOBALAMIN) 100 MCG tablet Take 1,000 mcg by mouth every other day.   . Wheat Dextrin (BENEFIBER) POWD Stir 2 tsp. TID into 4-8 oz of any non-carbonated beverage or soft food (hot or cold)  . primidone (MYSOLINE) 50 MG tablet Take by mouth.   No current facility-administered medications on file prior to visit.    ROS  Constitutional: Denies any fever or chills Gastrointestinal: No reported hemesis, hematochezia, vomiting, or acute GI distress Musculoskeletal: Denies any acute onset joint swelling, redness, loss of ROM, or weakness Neurological: No reported episodes of acute onset apraxia, aphasia, dysarthria, agnosia, amnesia, paralysis, loss of coordination, or loss of  consciousness  Allergies  Ms. RaLochs allergic to fentanyl; nsaids; and pentothal [thiopental].  PFSH  Drug: Ms. RaAinleyreports that she does not use drugs. Alcohol:  reports that she does not drink alcohol. Tobacco:  reports that she has been smoking Cigarettes.  She has a 25.00  pack-year smoking history. She has never used smokeless tobacco. Medical:  has a past medical history of Anxiety; Aortic aneurysm ([place]) (06/06/2016); Cataract; Chest pain (08/18/2015); Decreased muscle strength (02/10/2014); Depression; Difficulty in walking(719.7) (02/10/2014); Dysphagia (08/19/2015); Dyspnea (08/19/2015); Hyperparathyroidism, primary ([place]); Hyperventilation (08/18/2015); Insomnia; Intractable back pain (04/29/2014); Kidney stone; Lumbar spinal stenosis; Odynophagia (08/18/2015); Osteoarthritis of both knees; Pain; Rectal prolapse; Unintentional weight loss (09/08/2015); and UTI (lower urinary tract infection) (04/28/2014). Family: family history includes COPD in her father; Diabetes in her son; Heart failure in her father; Mental illness in her mother.  Past Surgical History:  Procedure Laterality Date  . ABDOMINAL HYSTERECTOMY    . APPENDECTOMY    . BACK SURGERY  07-2015   major surgery  . BREAST LUMPECTOMY     Reports this is benign  . colonoscopy  2013  . KNEE SURGERY Bilateral 1990's nd 2002   Knee Replacements  . PARATHYROIDECTOMY  1990's  . SPINE SURGERY    . TONSILLECTOMY     Constitutional Exam  General appearance: Well nourished, well developed, and well hydrated. In no apparent acute distress Vitals:   04/24/17 1100  BP: 131/77  Pulse: (!) 114  Resp: 16  Temp: 99.2 F (37.3 C)  TempSrc: Oral  SpO2: 95%  Weight: 130 lb (59 kg)  Height: _0  (1.549 m)   BMI Assessment: Estimated body mass index is 24.56 kg/m as calculated from the following:   Height as of this encounter: _1  (1.549 m).   Weight as of this encounter: 130 lb (59 kg).  BMI interpretation table: BMI level  Category Range association with higher incidence of chronic pain  <18 kg/m2 Underweight   18.5-24.9 kg/m2 Ideal body weight   25-29.9 kg/m2 Overweight Increased incidence by 20%  30-34.9 kg/m2 Obese (Class I) Increased incidence by 68%  35-39.9 kg/m2 Severe obesity (Class II) Increased incidence by 136%  >40 kg/m2 Extreme obesity (Class III) Increased incidence by 254%   BMI Readings from Last 4 Encounters:  04/24/17 24.56 kg/m  04/16/17 25.39 kg/m  02/14/17 24.19 kg/m  01/23/17 24.19 kg/m   Wt Readings from Last 4 Encounters:  04/24/17 130 lb (59 kg)  04/16/17 130 lb (59 kg)  02/14/17 128 lb (58.1 kg)  01/23/17 128 lb (58.1 kg)  Psych/Mental status: Alert, oriented x 3 (person, place, & time)       Eyes: PERLA Respiratory: No evidence of acute respiratory distress  Cervical Spine Exam  Inspection: No masses, redness, or swelling Alignment: Symmetrical Functional ROM: Unrestricted ROM      Stability: No instability detected Muscle strength & Tone: Functionally intact Sensory: Unimpaired Palpation: No palpable anomalies              Upper Extremity (UE) Exam    Side: Right upper extremity  Side: Left upper extremity  Inspection: No masses, redness, swelling, or asymmetry. No contractures  Inspection: No masses, redness, swelling, or asymmetry. No contractures  Functional ROM: Unrestricted ROM          Functional ROM: Unrestricted ROM          Muscle strength & Tone: Functionally intact  Muscle strength & Tone: Functionally intact  Sensory: Unimpaired  Sensory: Unimpaired  Palpation: No palpable anomalies              Palpation: No palpable anomalies              Specialized Test(s): Deferred         Specialized Test(s): Deferred  Thoracic Spine Exam  Inspection: No masses, redness, or swelling Alignment: Symmetrical Functional ROM: Unrestricted ROM Stability: No instability detected Sensory: Unimpaired Muscle strength & Tone: No palpable anomalies  Lumbar  Spine Exam  Inspection: Well healed scar from previous spine surgery detected Alignment: Symmetrical Functional ROM: Unrestricted ROM      Stability: No instability detected Muscle strength & Tone: Functionally intact Sensory: Unimpaired Palpation: Complains of area being tender to palpation       Provocative Tests: Lumbar Hyperextension and rotation test: evaluation deferred today       Patrick's Maneuver: evaluation deferred today                    Gait & Posture Assessment  Ambulation: Unassisted Gait: Relatively normal for age and body habitus Posture: WNL   Lower Extremity Exam    Side: Right lower extremity  Side: Left lower extremity  Inspection: No masses, redness, swelling, or asymmetry. No contractures  Inspection: No masses, redness, swelling, or asymmetry. No contractures  Functional ROM: Unrestricted ROM          Functional ROM: Unrestricted ROM          Muscle strength & Tone: Functionally intact  Muscle strength & Tone: Functionally intact  Sensory: Unimpaired  Sensory: Unimpaired  Palpation: No palpable anomalies  Palpation: No palpable anomalies   Assessment  Primary Diagnosis & Pertinent Problem List: The primary encounter diagnosis was Chronic low back pain (Location of Primary Source of Pain) (Bilateral) (R>L). Diagnoses of Spinal stenosis of lumbar region, unspecified whether neurogenic claudication present, Chronic hip pain (Location of Secondary source of pain) (Left), Musculoskeletal pain, Chronic pain syndrome, and Long term current use of opiate analgesic were also pertinent to this visit.  Status Diagnosis  Controlled Controlled Controlled 1. Chronic low back pain (Location of Primary Source of Pain) (Bilateral) (R>L)   2. Spinal stenosis of lumbar region, unspecified whether neurogenic claudication present   3. Chronic hip pain (Location of Secondary source of pain) (Left)   4. Musculoskeletal pain   5. Chronic pain syndrome   6. Long term current use  of opiate analgesic     Problems updated and reviewed during this visit: Problem  Musculoskeletal Pain  Chronic Pain Syndrome  Chronic upper back pain (midline)  Lumbar spondylosis  Lumbar facet syndrome (Bilateral) (R>L)   Confirmed the diagnostic bilateral lumbar facet block we 100% relief of the pain.   Chronic groin pain (Location of Tertiary source of pain) (Left)  Postlaminectomy Syndrome of Lumbar Region   Previous posterior laminectomy decompression at L1-2, L2-3, & L4-5.   Opioid-induced constipation (OIC)  Osteoarthritis, Multiple Sites  Chronic low back pain (Location of Primary Source of Pain) (Bilateral) (R>L)   The patient has a history of 4 prior back surgeries, the first one done in [place] in [place] and the last 3 done by Dr. Starling Manns, the last of which was approximately 18 2016. Dr. Starling Manns has also been doing her injections, the last of which was approximately 8 months ago.   Failed back surgical syndrome (x4)   T10 through L3 fusion. Loosening of the BILATERAL T10 screws. Slight medial placement of the RIGHT T11 and T12 screws. Solid posterior fusion at T12-L1, but no evidence of T10-11 or T11-12 mature arthrodesis.   Chronic knee pain (Bilateral) (L>R)   The patient complains of bilateral knee pain despite a history of a bilateral total knee replacement.   Osteoarthritis of hip  (Bilateral) (L>R)  Osteoarthritis of sacroiliac joint (Bilateral)  Lumbar facet arthropathy (HCC)   L3-4: posterior element hypertrophy. L4-5: advanced facet arthropathy. L5-S1: facet mediated slip; posterior element hypertrophy   Lumbar spondylolisthesis   L3-4: 6 mm anterolisthesis L4-5: 3 mm anterolisthesis L5-S1: 2 mm of facet mediated listhesis   Lumbar foraminal stenosis (Bilateral)  Chronic hip pain (Location of Secondary source of pain) (Left)  Lumbar Spinal Stenosis   s/p laminectomy   Long Term Current Use of Opiate Analgesic  Long Term Prescription Opiate  Use  Opiate use (60 MME/Day)  Acute Postoperative Pain  Tremor  Encounter for Pain Management Planning  Encounter for Therapeutic Drug Level Monitoring  Abnormal CT scan, lumbar spine (05/07/2015)   FINDINGS: CT THORACIC SPINE FINDINGS T10 through L3 fusion. Loosening of the BILATERAL T10 screws. Slight medial placement of the RIGHT T11 and T12 screws. Solid posterior fusion at T12-L1, but no evidence of T10-11 or T11-12 mature arthrodesis. Multilevel advanced disc space narrowing throughout the thoracic spine without worrisome osseous lesion or compression fracture. This appearance is similar to prior thoracic MR from 2015. CT LUMBAR SPINE FINDINGS T10 through L3 fusion. Inferior aspect of the fusion construct is intact. The L1, L2, and L3 screws are unremarkable. Adequate posterior decompression at L1-2 and L2-3. Slight vacuum phenomenon in the L1-2 interspace non worrisome. No visible impingement at these levels. At L3-4 there is 6 mm anterolisthesis. There is moderate disc space narrowing with calcification of the annulus posteriorly but no significant protrusion. Moderate posterior element hypertrophy. Solid appearing posterior fusion at this level. No impingement. At L4-5 there is a central protrusion. Vacuum phenomenon is seen within the interspace. 3 mm anterolisthesis. Advanced facet arthropathy. Previous laminectomy with partial regrowth of posterior elements. LEFT greater than RIGHT subarticular zone and foraminal narrowing could affect the nerve roots at this level. At L5-S1 there is 2 mm of facet mediated slip. Severe disc space narrowing. Functional fusion posteriorly and across the interspace. No spinal stenosis or subarticular zone narrowing. BILATERAL foraminal narrowing relates to disc osteophyte complex and posterior element hypertrophy, affecting the LEFT and RIGHT L5 nerve roots. Moderate atheromatous change of the aorta without aneurysmal dilatation. No paravertebral or renal  mass. Compared with MR from 04/28/2014, the appearance at L3-4 and L5-S1 is similar. Central protrusion at L4-5 May be increased. Previously documented fluid collection is improved/resolved. IMPRESSION: CT THORACIC SPINE IMPRESSION No solid T10-11 or T11-12 arthrodesis. Slight medial placement of the RIGHT T11 and RIGHT T12 screws entering the lateral spinal canal. BILATERAL T10 screw loosening. CT LUMBAR SPINE IMPRESSION Solid T12 through L3 fusion. L3-4 fusion in a position of 6 mm anterolisthesis.  No impingement. Central protrusion at L4-5 with 3 mm anterolisthesis. Partial regrowth of posterior elements. LEFT greater than RIGHT neural impingement is likely. Facet mediated slip at L5-S1.  BILATERAL foraminal narrowing. Electronically Signed by: Staci Righter M.D. on: 05/07/2015 13:05.   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   Pseudarthrosis After Fusion Or Arthrodesis   No solid T10-11 or T11-12 arthrodesis.   Pseudarthrosis Following Spinal Fusion  Generalized Weakness  History of lumbar fusion (T10 through L3 fusion)   04/20/14 at [facility] by Dr. Patrice Paradise. T10 through L3 fusion.   Obesity (Bmi 30-39.9)  Anxiety and Depression   Plan of Care  Pharmacotherapy (Medications Ordered): Meds ordered this encounter  Medications  . cyclobenzaprine (FLEXERIL) 10 MG tablet    Sig: Take 1 tablet (10 mg total) by mouth at bedtime.    Dispense:  120 tablet    Refill:  0    Do not place this medication, or any other prescription from our practice, on "Automatic Refill". Patient may have prescription filled one day early if pharmacy is closed on scheduled refill date.    Order Specific Question:   Supervising  Provider    Answer:   Milinda Pointer [phone]  . Oxycodone HCl 10 MG TABS    Sig: Take 1 tablet (10 mg total) by mouth every 6 (six) hours as needed.    Dispense:  120 tablet    Refill:  0    Fill one day early if pharmacy is closed on scheduled refill date. Do not fill until: 06/07/17 To last until: 07/07/17    Order Specific Question:   Supervising Provider    Answer:   Milinda Pointer [phone]  . Oxycodone HCl 10 MG TABS    Sig: Take 1 tablet (10 mg total) by mouth every 6 (six) hours as needed.    Dispense:  120 tablet    Refill:  0    Patient may have prescription filled one day early if pharmacy is closed on scheduled refill date. Do not fill until: 07/07/17 To last until: 08/06/17    Order Specific Question:   Supervising Provider    Answer:   Milinda Pointer [phone]  . Oxycodone HCl 10 MG TABS    Sig: Take 1 tablet (10 mg total) by mouth every 6 (six) hours as needed.    Dispense:  120 tablet    Refill:  0    Fill one day early if pharmacy is closed on scheduled refill date. Do not fill until: 08/06/17 To last until: 09/05/17    Order Specific Question:   Supervising Provider    Answer:   Milinda Pointer [phone]   New Prescriptions   No medications on file   Medications administered today: Ms. Schaffer had no medications administered during this visit. Lab-work, procedure(s), and/or referral(s): Orders Placed This Encounter  Procedures  . ToxASSURE Select 13 (MW), Urine   Imaging and/or referral(s): None  Interventional therapies: Planned, scheduled, and/or pending:   Not at this time.   Considering:   Diagnostic bilateral lumbar facet block #2under fluoro and sedation. Diagnostic bilateral thoracolumbar facet block. Possible bilateral thoracolumbar facet radiofrequency ablation.  Diagnostic left intra-articular hip joint injection. Diagnostic left femoral and obturator nerve articular branch blocks.  Possible left femoral and obturator nerve  articular branch radiofrequency ablation.  Diagnostic bilateral genicular nerve blocks.  Possible bilateral genicular nerve radiofrequency ablation.  Diagnostic intrathecal pump trial.    Palliative PRN treatment(s):   Palliative bilateral lumbar facet block    Provider-requested follow-up: No Follow-up on file.  Future Appointments Date Time Provider [place]  04/25/2017 1:15 PM Mcarthur Rossetti, MD [facility] None  04/27/2017 11:15 AM Nestor Lewandowsky, MD [facility] None  05/29/2017 10:00 AM Milinda Pointer, MD [facility] None  06/05/2017 3:00 PM [name], MD [facility] None  08/23/2017 11:00 AM Vevelyn Francois, NP [facility] None   Primary Care Physician: [name], MD Location: [facility] Note by: Vevelyn Francois NP Date: 04/24/2017; Time: 3:37 PM  Pain Score Disclaimer: We use the NRS-11 scale. This is a  self-reported, subjective measurement of pain severity with only modest accuracy. It is used primarily to identify changes within a particular patient. It must be understood that outpatient pain scales are significantly less accurate that those used for research, where they can be applied under ideal controlled circumstances with minimal exposure to variables. In reality, the score is likely to be a combination of pain intensity and pain affect, where pain affect describes the degree of emotional arousal or changes in action readiness caused by the sensory experience of pain. Factors such as social and work situation, setting, emotional state, anxiety levels, expectation, and prior pain experience may influence pain perception and show large inter-individual differences that may also be affected by time variables.  Patient instructions provided during this appointment: Patient Instructions  You were given 3 prescriptions for Oxycodone today. A prescription for Flexeril was sent to your pharmacy.

## 2017-04-24 NOTE — Progress Notes (Signed)
Nursing Pain Medication Assessment:  Safety precautions to be maintained throughout the outpatient stay will include: orient to surroundings, keep bed in low position, maintain call bell within reach at all times, provide assistance with transfer out of bed and ambulation.  Medication Inspection Compliance: Pill count conducted under aseptic conditions, in front of the patient. Neither the pills nor the bottle was removed from the patient's sight at any time. Once count was completed pills were immediately returned to the patient in their original bottle.  Medication: Oxycodone IR Pill/Patch Count: 69 of 120 pills remain Pill/Patch Appearance: Markings consistent with prescribed medication Bottle Appearance: Standard pharmacy container. Clearly labeled. Filled Date06/02/ 2018 Last Medication intake:  Has not taken in 2 days   Returned last prescription for Hydrocoone given post RF. Had some from previous RF, did not get the most recent one filled.

## 2017-04-25 ENCOUNTER — Ambulatory Visit (INDEPENDENT_AMBULATORY_CARE_PROVIDER_SITE_OTHER): Payer: Medicare PPO | Admitting: Orthopaedic Surgery

## 2017-04-25 ENCOUNTER — Encounter (INDEPENDENT_AMBULATORY_CARE_PROVIDER_SITE_OTHER): Payer: Self-pay | Admitting: Orthopaedic Surgery

## 2017-04-25 ENCOUNTER — Ambulatory Visit (INDEPENDENT_AMBULATORY_CARE_PROVIDER_SITE_OTHER): Payer: Medicare PPO

## 2017-04-25 DIAGNOSIS — M1612 Unilateral primary osteoarthritis, left hip: Secondary | ICD-10-CM | POA: Diagnosis not present

## 2017-04-25 DIAGNOSIS — M25552 Pain in left hip: Secondary | ICD-10-CM

## 2017-04-25 NOTE — Progress Notes (Signed)
Patient's someone had seen a while but comes in today for me to evaluate her left hip again. She said she's recently had back injections and now her left hip pain is subsided greatly. She's had mobile back surgeries in the past as well. She is also had bilateral knee replacement.  On examination of her left hip moves fluidly internally and externally without any pain at all is no pain of the trochanteric area her right hip exam is also normal.  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  At this point nothing really had recommended for her hips that she is doing so well. If it does get to bother her enough she'll let us know. All questions were encouraged and answered.

## 2017-04-27 ENCOUNTER — Ambulatory Visit (INDEPENDENT_AMBULATORY_CARE_PROVIDER_SITE_OTHER): Payer: Medicare PPO | Admitting: Cardiothoracic Surgery

## 2017-04-27 ENCOUNTER — Encounter: Payer: Self-pay | Admitting: Cardiothoracic Surgery

## 2017-04-27 VITALS — BP 127/80 | HR 111 | Temp 97.8°F | Wt 130.0 lb

## 2017-04-27 DIAGNOSIS — I712 Thoracic aortic aneurysm, without rupture, unspecified: Secondary | ICD-10-CM

## 2017-04-27 NOTE — Patient Instructions (Signed)
We will call you in one year to schedule your CT Scan and follow up appointment with Dr. Genevive Bi.

## 2017-04-27 NOTE — Progress Notes (Signed)
  Patient ID: Brittany Mays, female   DOB: 06/13/1941, 76 y.o.   MRN: [id number]  HISTORY: This patient comes in today without complaints. She states she's been doing well since she was last seen.   Vitals:   04/27/17 1112  BP: 127/80  Pulse: (!) 111  Temp: 97.8 F (36.6 C)     EXAM:    Resp: Lungs are clear bilaterally.  No respiratory distress, normal effort. Heart:  Regular without murmurs Abd:  Abdomen is soft, non distended and non tender. No masses are palpable.  There is no rebound and no guarding.  Neurological: Alert and oriented to person, place, and time. Coordination normal.  Skin: Skin is warm and dry. No rash noted. No diaphoretic. No erythema. No pallor.  Psychiatric: Normal mood and affect. Normal behavior. Judgment and thought content normal.    ASSESSMENT: I have independently reviewed her chest CT. 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 There is no other pathology seen   PLAN:   . I would like to see her back again in one year. We will get another CT without contrast at that time. She is agreeable to these recommendations.    Nestor Lewandowsky, MD

## 2017-04-29 LAB — TOXASSURE SELECT 13 (MW), URINE

## 2017-05-25 DIAGNOSIS — M7062 Trochanteric bursitis, left hip: Secondary | ICD-10-CM | POA: Insufficient documentation

## 2017-05-25 DIAGNOSIS — M19079 Primary osteoarthritis, unspecified ankle and foot: Secondary | ICD-10-CM | POA: Insufficient documentation

## 2017-05-28 NOTE — Progress Notes (Signed)
Patient's Name: Brittany Mays  MRN: [id number]  Referring Provider: [name], MD  DOB: 1941/04/12  PCP: [name], MD  DOS: 05/29/2017  Note by: Gaspar Cola, MD  Service setting: Ambulatory outpatient  Specialty: Interventional Pain Management  Location: [facility] (AMB) Pain Management Facility    Patient type: Established   Primary Reason(s) for Visit: Encounter for post-procedure evaluation of chronic illness with mild to moderate exacerbation CC: Hip Pain (left)  HPI  [name] is a 76 y.o. year old, female patient, who comes today for a post-procedure evaluation. She has Anxiety and depression; Obesity (BMI 30-39.9); Lumbar spinal stenosis; History of lumbar fusion (T10 through L3 fusion); Generalized weakness; Mechanical complication of internal fixation device such as nail, plate or rod ([place]); Pseudarthrosis after fusion or arthrodesis; Solitary pulmonary nodule; Chronic hip pain (Location of Secondary source of pain) (Left); Hyperlipidemia; Aortic aneurysm ([place]); Osteoarthritis, multiple sites; Chronic low back pain (Location of Primary Source of Pain) (Bilateral) (R>L); Failed back surgical syndrome (x4); Chronic knee pain (Bilateral) (L>R); Long term current use of opiate analgesic; Long term prescription opiate use; Opiate use (60 MME/Day); Encounter for pain management planning; Encounter for therapeutic drug level monitoring; Abnormal CT scan, lumbar spine (05/07/2015); Osteoarthritis of hip  (Bilateral) (L>R); Osteoarthritis of sacroiliac joint (Bilateral); Lumbar facet arthropathy ([place]); Lumbar spondylolisthesis; Lumbar foraminal stenosis (Bilateral); History of total bilateral knee replacement; Opioid-induced constipation ([place]); Chronic groin pain (Location of Tertiary source of pain) (Left); Postlaminectomy syndrome of lumbar region; Lumbar facet syndrome (Bilateral) (R>L); Pseudarthrosis following spinal fusion; Lumbar spondylosis; Chronic upper back pain (midline);  Chronic pain syndrome; Tremor; Musculoskeletal pain; Acute postoperative pain; Unilateral primary osteoarthritis, left hip; Osteoarthritis of ankle or foot; and Trochanteric bursitis of left hip on her problem list. Her primarily concern today is the Hip Pain (left)  Pain Assessment: Location: Left Hip Radiating: down left leg, to the knee, but unsure because of knee pain also Onset: More than a month ago Duration: Chronic pain Quality: Sharp Severity: 8 /10 (self-reported pain score)  Note: Reported level is inconsistent with clinical observations. Clinically the patient looks like a 3/10 Information on the proper use of the pain scale provided to the patient today Effect on ADL:   Timing: Intermittent Modifying factors: Oxycodone  [name] comes in today for post-procedure evaluation after the treatment done on 04/17/2017. The patient returns after having completed the radiofrequency of the lumbar facets, bilaterally. She is extremely happy with the results and she claims that it has been many years since she last experienced no pain in the lower back. Unfortunately, she is having some pain and discomfort in the area of the hip. Physical exam today was positive during the Patrick maneuver for hip joint pain coming from the left side. By the patient's request, we have scheduled her to return for a diagnostic intra-articular left hip joint injection under fluoroscopic guidance and IV sedation.  Further details on both, my assessment(s), as well as the proposed treatment plan, please see below.  Post-Procedure Assessment  04/17/2017 Procedure: Therapeutic bilateral lumbar facet radiofrequency ablation under fluoroscopic guidance and IV sedation. (Right side done on 02/14/2017 and left side done on 04/16/2017). Pre-procedure pain score:  2/10 Post-procedure pain score: 0/10 (100% relief) Influential Factors: BMI: 25.00 kg/m Intra-procedural challenges: None observed.         Assessment  challenges: None detected.              Reported side-effects: None.  Post-procedural adverse reactions or complications: None reported         Sedation: Sedation provided. When no sedatives are used, the analgesic levels obtained are directly associated to the effectiveness of the local anesthetics. However, when sedation is provided, the level of analgesia obtained during the initial 1 hour following the intervention, is believed to be the result of a combination of factors. These factors may include, but are not limited to: 1. The effectiveness of the local anesthetics used. 2. The effects of the analgesic(s) and/or anxiolytic(s) used. 3. The degree of discomfort experienced by the patient at the time of the procedure. 4. The patients ability and reliability in recalling and recording the events. 5. The presence and influence of possible secondary gains and/or psychosocial factors. Reported result: Relief experienced during the 1st hour after the procedure: 100 % (Ultra-Short Term Relief) Interpretative annotation: Clinically appropriate result. Analgesia during this period is likely to be Local Anesthetic and/or IV Sedative (Analgesic/Anxiolytic) related.          Effects of local anesthetic: The analgesic effects attained during this period are directly associated to the localized infiltration of local anesthetics and therefore cary significant diagnostic value as to the etiological location, or anatomical origin, of the pain. Expected duration of relief is directly dependent on the pharmacodynamics of the local anesthetic used. Long-acting (4-6 hours) anesthetics used.  Reported result: Relief during the next 4 to 6 hour after the procedure: 100 % (Short-Term Relief) Interpretative annotation: Clinically appropriate result. Analgesia during this period is likely to be Local Anesthetic-related.          Long-term benefit: Defined as the period of time past the expected duration of local  anesthetics. With the possible exception of prolonged sympathetic blockade from the local anesthetics, benefits during this period are typically attributed to, or associated with, other factors such as analgesic sensory neuropraxia, antiinflammatory effects, or beneficial biochemical changes provided by agents other than the local anesthetics Reported result: Extended relief following procedure: 100 % (Long-Term Relief) Interpretative annotation: Clinically appropriate result. Good relief. Therapeutic success. Etiology is likely mechanical rather than inflammatory. Benefit could signal adequate RF ablation.  Current benefits: Defined as persistent relief that continues at this point in time.   Reported results: Treated area: 100 %       Interpretative annotation: Ongoing benefit. Therapeutic success. Adequate RF ablation.          Interpretation: Results would suggest a successful therapeutic intervention.          Laboratory Chemistry  Inflammation Markers (CRP: Acute Phase) (ESR: Chronic Phase) Lab Results  Component Value Date   CRP 0.6 07/12/2016   ESRSEDRATE 6 07/12/2016                 Renal Function Markers Lab Results  Component Value Date   BUN 14 07/12/2016   CREATININE 0.80 10/12/2016   GFRAA >60 07/12/2016   GFRNONAA >60 07/12/2016                 Hepatic Function Markers Lab Results  Component Value Date   AST 25 07/12/2016   ALT 18 07/12/2016   ALBUMIN 4.3 07/12/2016   ALKPHOS 69 07/12/2016   HCVAB NEGATIVE 09/07/2015                 Electrolytes Lab Results  Component Value Date   NA 140 07/12/2016   K 4.1 07/12/2016   CL 102 07/12/2016   CALCIUM 9.1 07/12/2016   MG 2.0  07/12/2016                 Neuropathy Markers Lab Results  Component Value Date   MQTTCNGF94 3,200 (H) 07/12/2016                 Bone Pathology Markers Lab Results  Component Value Date   ALKPHOS 69 07/12/2016   25OHVITD1 35 07/12/2016   25OHVITD2 <1.0 07/12/2016   25OHVITD3  35 07/12/2016   CALCIUM 9.1 07/12/2016                 Coagulation Parameters Lab Results  Component Value Date   PLT 168.0 09/07/2015                 Cardiovascular Markers Lab Results  Component Value Date   HGB 14.4 09/07/2015   HCT 44.0 09/07/2015                 Note: Lab results reviewed.  Recent Diagnostic Imaging Review  Ct Chest Wo Contrast  Result Date: 04/20/2017 CLINICAL DATA:  Ascending aortic aneurysm EXAM: CT CHEST WITHOUT CONTRAST TECHNIQUE: Multidetector CT imaging of the chest was performed following the standard protocol without IV contrast. COMPARISON:  10/12/2006 FINDINGS: Cardiovascular: The heart is normal in size. No pericardial effusion. Coronary atherosclerosis in left circumflex. 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 No evidence of ascending thoracic aortic aneurysm. Mild atherosclerotic calcifications. Mediastinum/Nodes: Small mediastinal lymph nodes which do not meet pathologic CT size criteria. Visualized thyroid is unremarkable. Lungs/Pleura: 2 mm left upper lobe pulmonary nodule (series 3/ image 51), unchanged. Stable branching opacity in the right upper lobe favoring mucous plugging (series 3/ image 44). Mild subpleural nodularity in the right upper lobe measuring up to 3 mm (series 3/image 41), unchanged. No new/ suspicious pulmonary nodules. No focal consolidation. Right posterior Bochdalek's hernia. No pleural effusion or pneumothorax. Upper Abdomen: Visualized upper abdomen is unremarkable. Musculoskeletal: Degenerative changes of the visualized thoracolumbar spine. Posterior thoracolumbar fixation at T10-L2. 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 No evidence of acute cardiopulmonary disease. Aortic Atherosclerosis (ICD10-I70.0). Electronically Signed   By: Julian Hy M.D.   On: 04/20/2017 08:33   Note: Imaging results reviewed.          Meds   Current  Meds  Medication Sig  . Ascorbic Acid (VITAMIN C) 500 MG CAPS Take by mouth daily.  . cyclobenzaprine (FLEXERIL) 10 MG tablet Take 1 tablet (10 mg total) by mouth at bedtime.  . DULoxetine (CYMBALTA) 60 MG capsule TAKE 1 CAPSULE EVERY DAY  . fish oil-omega-3 fatty acids 1000 MG capsule Take 1 g by mouth daily.   Marland Kitchen gabapentin (NEURONTIN) 300 MG capsule Take 300 mg (one tablet) by mouth in the morning and take 600 mg (2 tablets) by mouth at night  . HYDROcodone-acetaminophen (NORCO/VICODIN) 5-325 MG tablet Take 1 tablet by mouth 3 (three) times daily as needed.  . lubiprostone (AMITIZA) 8 MCG capsule Take 1 capsule (8 mcg total) by mouth 2 (two) times daily with a meal. Swallow the medication whole. Do not break or chew the medication.  . Melatonin 5 MG TABS Take 1 tablet by mouth at bedtime as needed.   . Misc Natural Products (OSTEO BI-FLEX ADV TRIPLE ST PO) Take 1 tablet by mouth daily.   . Multiple Vitamins-Minerals (WOMENS MULTI) CAPS Take one tablet once daily  . naloxone (NARCAN) 2 MG/2ML injection Inject content  of syringe into thigh muscle. Call 911.  . omega-3 acid ethyl esters (LOVAZA) 1 g capsule Take 1 capsule by mouth 1 day or 1 dose.  Derrill Memo ON 08/06/2017] Oxycodone HCl 10 MG TABS Take 1 tablet (10 mg total) by mouth every 6 (six) hours as needed.  . Polyethylene Glycol 3350 GRAN   . primidone (MYSOLINE) 50 MG tablet Take by mouth 65m (1/2 tablet) nightly for two weeks then 211m(1/2 tablet) two times a day.  . Red Yeast Rice 600 MG CAPS Take 2 capsules (1,200 mg total) by mouth daily.  . vitamin B-12 (CYANOCOBALAMIN) 100 MCG tablet Take 1,000 mcg by mouth every other day.   . Wheat Dextrin (BENEFIBER) POWD Stir 2 tsp. TID into 4-8 oz of any non-carbonated beverage or soft food (hot or cold)    ROS  Constitutional: Denies any fever or chills Gastrointestinal: No reported hemesis, hematochezia, vomiting, or acute GI distress Musculoskeletal: Denies any acute onset joint  swelling, redness, loss of ROM, or weakness Neurological: No reported episodes of acute onset apraxia, aphasia, dysarthria, agnosia, amnesia, paralysis, loss of coordination, or loss of consciousness  Allergies  Ms. RaLegners allergic to fentanyl; nsaids; and pentothal [thiopental].  PFSH  Drug: Ms. RaBlossomreports that she does not use drugs. Alcohol:  reports that she does not drink alcohol. Tobacco:  reports that she has been smoking Cigarettes.  She has a 25.00 pack-year smoking history. She has never used smokeless tobacco. Medical:  has a past medical history of Anxiety; [place]); Cataract; Chest pain (08/18/2015); Decreased muscle strength (02/10/2014); Depression; Difficulty in walking(719.7) (02/10/2014); Dysphagia (08/19/2015); Dyspnea (08/19/2015); Hyperparathyroidism, primary ([place] Hyperventilation (08/18/2015); Insomnia; Intractable back pain (04/29/2014); Kidney stone; Lumbar spinal stenosis; Odynophagia (08/18/2015); Osteoarthritis of both knees; Pain; Rectal prolapse; Unintentional weight loss (09/08/2015); and UTI (lower urinary tract infection) (04/28/2014). Surgical: Ms. RaEncarnacionhas a past surgical history that includes Abdominal hysterectomy; Knee surgery (Bilateral, 1990's nd 2002); Spine surgery; Parathyroidectomy (1(8341'D colonoscopy (2013); Appendectomy; Breast lumpectomy; Tonsillectomy; and Back surgery (07-2015). Family: family history includes COPD in her father; Diabetes in her son; Heart failure in her father; Mental illness in her mother.  Constitutional Exam  General appearance: Well nourished, well developed, and well hydrated. In no apparent acute distress Vitals:   05/29/17 1033  BP: 127/63  Pulse: 99  Resp: 18  Temp: 97.8 F (36.6 C)  TempSrc: Oral  SpO2: 99%  Weight: 128 lb (58.1 kg)  Height: 5' (1.524 m)   BMI Assessment: Estimated body mass index is 25 kg/m as calculated from the following:   Height as of this encounter: 5'  (1.524 m).   Weight as of this encounter: 128 lb (58.1 kg).  BMI interpretation table: BMI level Category Range association with higher incidence of chronic pain  <18 kg/m2 Underweight   18.5-24.9 kg/m2 Ideal body weight   25-29.9 kg/m2 Overweight Increased incidence by 20%  30-34.9 kg/m2 Obese (Class I) Increased incidence by 68%  35-39.9 kg/m2 Severe obesity (Class II) Increased incidence by 136%  >40 kg/m2 Extreme obesity (Class III) Increased incidence by 254%   BMI Readings from Last 4 Encounters:  05/29/17 25.00 kg/m  04/27/17 24.56 kg/m  04/24/17 24.56 kg/m  04/16/17 25.39 kg/m   Wt Readings from Last 4 Encounters:  05/29/17 128 lb (58.1 kg)  04/27/17 130 lb (59 kg)  04/24/17 130 lb (59 kg)  04/16/17 130 lb (59 kg)  Psych/Mental status: Alert, oriented x 3 (person, place, &  time)       Eyes: PERLA Respiratory: No evidence of acute respiratory distress  Cervical Spine Exam  Inspection: No masses, redness, or swelling Alignment: Symmetrical Functional ROM: Unrestricted ROM      Stability: No instability detected Muscle strength & Tone: Functionally intact Sensory: Unimpaired Palpation: No palpable anomalies              Upper Extremity (UE) Exam    Side: Right upper extremity  Side: Left upper extremity  Inspection: No masses, redness, swelling, or asymmetry. No contractures  Inspection: No masses, redness, swelling, or asymmetry. No contractures  Functional ROM: Unrestricted ROM          Functional ROM: Unrestricted ROM          Muscle strength & Tone: Functionally intact  Muscle strength & Tone: Functionally intact  Sensory: Unimpaired  Sensory: Unimpaired  Palpation: No palpable anomalies              Palpation: No palpable anomalies              Specialized Test(s): Deferred         Specialized Test(s): Deferred          Thoracic Spine Exam  Inspection: No masses, redness, or swelling Alignment: Symmetrical Functional ROM: Unrestricted ROM Stability: No  instability detected Sensory: Unimpaired Muscle strength & Tone: No palpable anomalies  Lumbar Spine Exam  Inspection: No masses, redness, or swelling Alignment: Symmetrical Functional ROM: Decreased ROM      Stability: No instability detected Muscle strength & Tone: Functionally intact Sensory: Movement-associated discomfort Palpation: Complains of area being tender to palpation       Provocative Tests: Lumbar Hyperextension and rotation test: Positive bilaterally for facet joint pain. Lumbar Lateral bending test: evaluation deferred today       Patrick's Maneuver: Positive             for left hip arthralgia  Gait & Posture Assessment  Ambulation: Patient ambulates using a cane Gait: Antalgic Posture: Antalgic   Lower Extremity Exam    Side: Right lower extremity  Side: Left lower extremity  Inspection: No masses, redness, swelling, or asymmetry. No contractures  Inspection: No masses, redness, swelling, or asymmetry. No contractures  Functional ROM: Unrestricted ROM          Functional ROM: Decreased ROM for hip joint  Muscle strength & Tone: Functionally intact  Muscle strength & Tone: Guarding  Sensory: Unimpaired  Sensory: Arthropathic arthralgia  Palpation: No palpable anomalies  Palpation: Tender   Assessment  Primary Diagnosis & Pertinent Problem List: The primary encounter diagnosis was Chronic hip pain (Location of Secondary source of pain) (Left). Diagnoses of Lumbar facet syndrome (Bilateral) (R>L), Lumbar facet arthropathy (HCC), Chronic low back pain (Location of Primary Source of Pain) (Bilateral) (R>L), and Primary osteoarthritis of both hips were also pertinent to this visit.  Status Diagnosis  Persistent Resolved Controlled 1. Chronic hip pain (Location of Secondary source of pain) (Left)   2. Lumbar facet syndrome (Bilateral) (R>L)   3. Lumbar facet arthropathy ([place])   4. Chronic low back pain (Location of Primary Source of Pain) (Bilateral) (R>L)   5.  Primary osteoarthritis of both hips     Problems updated and reviewed during this visit: Problem  Osteoarthritis of Ankle Or Foot  Trochanteric Bursitis of Left Hip  Unilateral Primary Osteoarthritis, Left Hip   Plan of Care  Pharmacotherapy (Medications Ordered): No orders of the defined types were placed in this encounter.  New Prescriptions   No medications on file   Medications administered today: Ms. Chilton had no medications administered during this visit.  Lab-work, procedure(s), and/or referral(s): Orders Placed This Encounter  Procedures  . HIP INJECTION    Interventional management options: Planned, scheduled, and/or pending:   Diagnostic left sided intra-articular hip joint injection under fluoroscopic guidance and IV sedation    Considering:   Diagnostic bilateral lumbar facet block #2under fluoro and sedation. Diagnostic bilateral thoracolumbar facet block. Possible bilateral thoracolumbar facet radiofrequencyablation.  Diagnostic left intra-articular hip joint injection. Diagnostic left femoral and obturator nerve articular branch blocks.  Possible left femoral and obturator nerve articular branch radiofrequencyablation.  Diagnostic bilateral genicular nerve blocks.  Possible bilateral genicular nerve radiofrequencyablation.  Diagnosticintrathecal pump trial.    Palliative PRN treatment(s):   Palliative bilateral lumbar facet block   Provider-requested follow-up: Return for procedure (w/ sedation), (ASAP), by MD.  Future Appointments Date Time Provider [place]  06/05/2017 3:00 PM [name], MD [id number] None  08/23/2017 11:00 AM Vevelyn Francois, NP [facility] None   Primary Care Physician: [name], MD Location: [facility] Note by: Gaspar Cola, MD Date: 05/29/2017; Time: 10:58 AM  Patient Instructions   ____________________________________________________________________________________________  Preparing for Procedure with Sedation Instructions: . Oral Intake: Do not eat or drink anything for at least 8 hours prior to your procedure. . Transportation: Public transportation is not allowed. Bring an adult driver. The driver must be physically present in our waiting room before any procedure can be started. Marland Kitchen Physical Assistance: Bring an adult physically capable of assisting you, in the event you need help. This adult should keep you company at home for at least 6 hours after the procedure. . Blood Pressure Medicine: Take your blood pressure medicine with a sip of water the morning of the procedure. . Blood thinners:  . Diabetics on insulin: Notify the staff so that you can be scheduled 1st case in the morning. If your diabetes requires high dose insulin, take only  of your normal insulin dose the morning of the procedure and notify the staff that you have done so. . Preventing infections: Shower with an antibacterial soap the morning of your procedure. . Build-up your immune system: Take 1000 mg of Vitamin C with every meal (3 times a day) the day prior to your procedure. Marland Kitchen Antibiotics: Inform the staff if you have a condition or reason that requires you to take antibiotics before dental procedures. . Pregnancy: If you are pregnant, call and cancel the procedure. . Sickness: If you have a cold, fever, or any active infections, call and cancel the procedure. . Arrival: You must be in the facility at least 30 minutes prior to your scheduled procedure. . Children: Do not bring children with you. . Dress appropriately: Bring dark clothing that you would not mind if they get stained. . Valuables: Do not bring any jewelry or valuables. Procedure appointments are reserved for interventional treatments only. Marland Kitchen No Prescription Refills. . No medication changes will be discussed during procedure  appointments. . No disability issues will be discussed. ____________________________________________________________________________________________

## 2017-05-29 ENCOUNTER — Ambulatory Visit: Payer: Medicare PPO | Attending: Pain Medicine | Admitting: Pain Medicine

## 2017-05-29 ENCOUNTER — Encounter: Payer: Self-pay | Admitting: Pain Medicine

## 2017-05-29 VITALS — BP 127/63 | HR 99 | Temp 97.8°F | Resp 18 | Ht 60.0 in | Wt 128.0 lb

## 2017-05-29 DIAGNOSIS — M19079 Primary osteoarthritis, unspecified ankle and foot: Secondary | ICD-10-CM | POA: Insufficient documentation

## 2017-05-29 DIAGNOSIS — M25552 Pain in left hip: Secondary | ICD-10-CM

## 2017-05-29 DIAGNOSIS — Z79891 Long term (current) use of opiate analgesic: Secondary | ICD-10-CM | POA: Insufficient documentation

## 2017-05-29 DIAGNOSIS — Z96653 Presence of artificial knee joint, bilateral: Secondary | ICD-10-CM | POA: Diagnosis not present

## 2017-05-29 DIAGNOSIS — F419 Anxiety disorder, unspecified: Secondary | ICD-10-CM | POA: Diagnosis not present

## 2017-05-29 DIAGNOSIS — G8929 Other chronic pain: Secondary | ICD-10-CM | POA: Diagnosis not present

## 2017-05-29 DIAGNOSIS — M47898 Other spondylosis, sacral and sacrococcygeal region: Secondary | ICD-10-CM | POA: Diagnosis not present

## 2017-05-29 DIAGNOSIS — Z818 Family history of other mental and behavioral disorders: Secondary | ICD-10-CM | POA: Insufficient documentation

## 2017-05-29 DIAGNOSIS — M545 Low back pain, unspecified: Secondary | ICD-10-CM

## 2017-05-29 DIAGNOSIS — M48061 Spinal stenosis, lumbar region without neurogenic claudication: Secondary | ICD-10-CM | POA: Diagnosis not present

## 2017-05-29 DIAGNOSIS — I719 Aortic aneurysm of unspecified site, without rupture: Secondary | ICD-10-CM | POA: Insufficient documentation

## 2017-05-29 DIAGNOSIS — F329 Major depressive disorder, single episode, unspecified: Secondary | ICD-10-CM | POA: Insufficient documentation

## 2017-05-29 DIAGNOSIS — M488X6 Other specified spondylopathies, lumbar region: Secondary | ICD-10-CM | POA: Diagnosis not present

## 2017-05-29 DIAGNOSIS — R1030 Lower abdominal pain, unspecified: Secondary | ICD-10-CM | POA: Diagnosis not present

## 2017-05-29 DIAGNOSIS — R262 Difficulty in walking, not elsewhere classified: Secondary | ICD-10-CM | POA: Insufficient documentation

## 2017-05-29 DIAGNOSIS — M961 Postlaminectomy syndrome, not elsewhere classified: Secondary | ICD-10-CM | POA: Insufficient documentation

## 2017-05-29 DIAGNOSIS — Z885 Allergy status to narcotic agent status: Secondary | ICD-10-CM | POA: Insufficient documentation

## 2017-05-29 DIAGNOSIS — G894 Chronic pain syndrome: Secondary | ICD-10-CM | POA: Diagnosis not present

## 2017-05-29 DIAGNOSIS — K5903 Drug induced constipation: Secondary | ICD-10-CM | POA: Diagnosis not present

## 2017-05-29 DIAGNOSIS — M4696 Unspecified inflammatory spondylopathy, lumbar region: Secondary | ICD-10-CM

## 2017-05-29 DIAGNOSIS — Z87440 Personal history of urinary (tract) infections: Secondary | ICD-10-CM | POA: Insufficient documentation

## 2017-05-29 DIAGNOSIS — G4700 Insomnia, unspecified: Secondary | ICD-10-CM | POA: Diagnosis not present

## 2017-05-29 DIAGNOSIS — Z833 Family history of diabetes mellitus: Secondary | ICD-10-CM | POA: Insufficient documentation

## 2017-05-29 DIAGNOSIS — G8918 Other acute postprocedural pain: Secondary | ICD-10-CM | POA: Insufficient documentation

## 2017-05-29 DIAGNOSIS — Z87442 Personal history of urinary calculi: Secondary | ICD-10-CM | POA: Insufficient documentation

## 2017-05-29 DIAGNOSIS — E785 Hyperlipidemia, unspecified: Secondary | ICD-10-CM | POA: Diagnosis not present

## 2017-05-29 DIAGNOSIS — F17210 Nicotine dependence, cigarettes, uncomplicated: Secondary | ICD-10-CM | POA: Diagnosis not present

## 2017-05-29 DIAGNOSIS — Z981 Arthrodesis status: Secondary | ICD-10-CM | POA: Insufficient documentation

## 2017-05-29 DIAGNOSIS — M160 Bilateral primary osteoarthritis of hip: Secondary | ICD-10-CM | POA: Diagnosis not present

## 2017-05-29 DIAGNOSIS — Z79899 Other long term (current) drug therapy: Secondary | ICD-10-CM | POA: Diagnosis not present

## 2017-05-29 DIAGNOSIS — Z5181 Encounter for therapeutic drug level monitoring: Secondary | ICD-10-CM | POA: Insufficient documentation

## 2017-05-29 DIAGNOSIS — M7062 Trochanteric bursitis, left hip: Secondary | ICD-10-CM | POA: Insufficient documentation

## 2017-05-29 DIAGNOSIS — M47816 Spondylosis without myelopathy or radiculopathy, lumbar region: Secondary | ICD-10-CM

## 2017-05-29 DIAGNOSIS — T402X5A Adverse effect of other opioids, initial encounter: Secondary | ICD-10-CM | POA: Diagnosis not present

## 2017-05-29 DIAGNOSIS — Z886 Allergy status to analgesic agent status: Secondary | ICD-10-CM | POA: Insufficient documentation

## 2017-05-29 DIAGNOSIS — Z8249 Family history of ischemic heart disease and other diseases of the circulatory system: Secondary | ICD-10-CM | POA: Insufficient documentation

## 2017-05-29 DIAGNOSIS — Z825 Family history of asthma and other chronic lower respiratory diseases: Secondary | ICD-10-CM | POA: Insufficient documentation

## 2017-05-29 NOTE — Progress Notes (Signed)
Safety precautions to be maintained throughout the outpatient stay will include: orient to surroundings, keep bed in low position, maintain call bell within reach at all times, provide assistance with transfer out of bed and ambulation.  

## 2017-05-29 NOTE — Patient Instructions (Signed)

## 2017-06-05 ENCOUNTER — Ambulatory Visit: Payer: Self-pay | Admitting: Family Medicine

## 2017-06-14 ENCOUNTER — Ambulatory Visit: Payer: Medicare PPO | Attending: Pain Medicine | Admitting: Pain Medicine

## 2017-06-14 DIAGNOSIS — M160 Bilateral primary osteoarthritis of hip: Secondary | ICD-10-CM | POA: Insufficient documentation

## 2017-06-14 NOTE — Progress Notes (Deleted)
Patient's Name: Brittany Mays  MRN: [id number]  Referring Provider: Milinda Pointer, MD  DOB: [birth year]  PCP: [name], MD  DOS: 06/14/2017  Note by: Gaspar Cola, MD  Service setting: Ambulatory outpatient  Specialty: Interventional Pain Management  Patient type: Established  Location: [facility] (AMB) Pain Management Facility  Visit type: Interventional Procedure   Primary Reason for Visit: Interventional Pain Management Treatment. CC: No chief complaint on file.  Procedure:  Anesthesia, Analgesia, Anxiolysis:  Type: Therapeutic Intra-Articular Hip Injection Region:  Posterolateral hip joint area. Level: Lower pelvic and hip joint level. Laterality: Left-Sided  Type: Local Anesthesia with Moderate (Conscious) Sedation Local Anesthetic: Lidocaine 1% Route: Intravenous (IV) IV Access: Secured Sedation: Meaningful verbal contact was maintained at all times during the procedure  Indication(s): Analgesia and Anxiety  Indications: 1. Chronic hip pain (Location of Secondary source of pain) (Left)   2. Primary osteoarthritis of both hips   3. Osteoarthritis of hip (Left)    Pain Score: Pre-procedure:  /10 Post-procedure:  /10  Pre-op Assessment:  Brittany Mays is a 76 y.o. (year old), female patient, seen today for interventional treatment. She  has a past surgical history that includes Abdominal hysterectomy; Knee surgery (Bilateral, 1990's nd 2002); Spine surgery; Parathyroidectomy (5102'H); colonoscopy (2013); Appendectomy; Breast lumpectomy; Tonsillectomy; and Back surgery (07-2015). Brittany Mays has a current medication list which includes the following prescription(s): vitamin c, cyclobenzaprine, duloxetine, fish oil-omega-3 fatty acids, gabapentin, hydrocodone-acetaminophen, lubiprostone, melatonin, misc natural products, womens multi, naloxone, omega-3 acid ethyl esters, oxycodone hcl, polyethylene glycol 3350, primidone, red yeast rice, vitamin b-12, and benefiber. Her  primarily concern today is the No chief complaint on file.  Initial Vital Signs: There were no vitals taken for this visit. BMI: Estimated body mass index is 25 kg/m as calculated from the following:   Height as of 05/29/17: 5' (1.524 m).   Weight as of 05/29/17: 128 lb (58.1 kg).  Risk Assessment: Allergies: Reviewed. She is allergic to fentanyl; nsaids; and pentothal [thiopental].  Allergy Precautions: None required Coagulopathies: Reviewed. None identified.  Blood-thinner therapy: None at this time Active Infection(s): Reviewed. None identified. Brittany Mays is afebrile  Site Confirmation: Brittany Mays was asked to confirm the procedure and laterality before marking the site Procedure checklist: Completed Consent: Before the procedure and under the influence of no sedative(s), amnesic(s), or anxiolytics, the patient was informed of the treatment options, risks and possible complications. To fulfill our ethical and legal obligations, as recommended by the American Medical Association's Code of Ethics, I have informed the patient of my clinical impression; the nature and purpose of the treatment or procedure; the risks, benefits, and possible complications of the intervention; the alternatives, including doing nothing; the risk(s) and benefit(s) of the alternative treatment(s) or procedure(s); and the risk(s) and benefit(s) of doing nothing. The patient was provided information about the general risks and possible complications associated with the procedure. These may include, but are not limited to: failure to achieve desired goals, infection, bleeding, organ or nerve damage, allergic reactions, paralysis, and death. In addition, the patient was informed of those risks and complications associated to the procedure, such as failure to decrease pain; infection; bleeding; organ or nerve damage with subsequent damage to sensory, motor, and/or autonomic systems, resulting in permanent pain, numbness,  and/or weakness of one or several areas of the body; allergic reactions; (i.e.: anaphylactic reaction); and/or death. Furthermore, the patient was informed of those risks and complications associated with the medications. These include, but are not limited  to: allergic reactions (i.e.: anaphylactic or anaphylactoid reaction(s)); adrenal axis suppression; blood sugar elevation that in diabetics may result in ketoacidosis or comma; water retention that in patients with history of congestive heart failure may result in shortness of breath, pulmonary edema, and decompensation with resultant heart failure; weight gain; swelling or edema; medication-induced neural toxicity; particulate matter embolism and blood vessel occlusion with resultant organ, and/or nervous system infarction; and/or aseptic necrosis of one or more joints. Finally, the patient was informed that Medicine is not an exact science; therefore, there is also the possibility of unforeseen or unpredictable risks and/or possible complications that may result in a catastrophic outcome. The patient indicated having understood very clearly. We have given the patient no guarantees and we have made no promises. Enough time was given to the patient to ask questions, all of which were answered to the patient's satisfaction. Brittany Mays has indicated that she wanted to continue with the procedure. Attestation: I, the ordering provider, attest that I have discussed with the patient the benefits, risks, side-effects, alternatives, likelihood of achieving goals, and potential problems during recovery for the procedure that I have provided informed consent. Date: 06/14/2017; Time: 8:10 AM  Pre-Procedure Preparation:  Monitoring: As per clinic protocol. Respiration, ETCO2, SpO2, BP, heart rate and rhythm monitor placed and checked for adequate function Safety Precautions: Patient was assessed for positional comfort and pressure points before starting the  procedure. Time-out: I initiated and conducted the "Time-out" before starting the procedure, as per protocol. The patient was asked to participate by confirming the accuracy of the "Time Out" information. Verification of the correct person, site, and procedure were performed and confirmed by me, the nursing staff, and the patient. "Time-out" conducted as per Joint Commission's Universal Protocol (UP.01.01.01). "Time-out" Date & Time: 06/14/2017;   hrs.  Description of Procedure Process:   Position: Lateral Decubitus with bad side up Target Area: Superior aspect of the hip joint cavity, going thru the superior portion of the capsular ligament. Approach: Posterolateral approach. Area Prepped: Entire Posterolateral hip area. Prepping solution: ChloraPrep (2% chlorhexidine gluconate and 70% isopropyl alcohol) Safety Precautions: Aspiration looking for blood return was conducted prior to all injections. At no point did we inject any substances, as a needle was being advanced. No attempts were made at seeking any paresthesias. Safe injection practices and needle disposal techniques used. Medications properly checked for expiration dates. SDV (single dose vial) medications used. Description of the Procedure: Protocol guidelines were followed. The patient was placed in position over the fluoroscopy table. The target area was identified and the area prepped in the usual manner. Skin & deeper tissues infiltrated with local anesthetic. Appropriate amount of time allowed to pass for local anesthetics to take effect. The procedure needles were then advanced to the target area. Proper needle placement secured. Negative aspiration confirmed. Solution injected in intermittent fashion, asking for systemic symptoms every 0.5cc of injectate. The needles were then removed and the area cleansed, making sure to leave some of the prepping solution back to take advantage of its long term bactericidal properties. There were no  vitals filed for this visit.  Start Time:   hrs. End Time:   hrs. Materials:  Needle(s) Type: Regular needle Gauge: 22G Length: 3.5-in Medication(s): Ms. Mertz had no medications administered during this visit. Please see chart orders for dosing details.  Imaging Guidance (Non-Spinal):  Type of Imaging Technique: Fluoroscopy Guidance (Non-Spinal) Indication(s): Assistance in needle guidance and placement for procedures requiring needle placement in or near specific  anatomical locations not easily accessible without such assistance. Exposure Time: Please see nurses notes. Contrast: Before injecting any contrast, we confirmed that the patient did not have an allergy to iodine, shellfish, or radiological contrast. Once satisfactory needle placement was completed at the desired level, radiological contrast was injected. Contrast injected under live fluoroscopy. No contrast complications. See chart for type and volume of contrast used. Fluoroscopic Guidance: I was personally present during the use of fluoroscopy. "Tunnel Vision Technique" used to obtain the best possible view of the target area. Parallax error corrected before commencing the procedure. "Direction-depth-direction" technique used to introduce the needle under continuous pulsed fluoroscopy. Once target was reached, antero-posterior, oblique, and lateral fluoroscopic projection used confirm needle placement in all planes. Images permanently stored in EMR. Interpretation: I personally interpreted the imaging intraoperatively. Adequate needle placement confirmed in multiple planes. Appropriate spread of contrast into desired area was observed. No evidence of afferent or efferent intravascular uptake. Permanent images saved into the patient's record.  Antibiotic Prophylaxis:  Indication(s): None identified Antibiotic given: None  Post-operative Assessment:  EBL: None Complications: No immediate post-treatment complications observed by  team, or reported by patient. Note: The patient tolerated the entire procedure well. A repeat set of vitals were taken after the procedure and the patient was kept under observation following institutional policy, for this type of procedure. Post-procedural neurological assessment was performed, showing return to baseline, prior to discharge. The patient was provided with post-procedure discharge instructions, including a section on how to identify potential problems. Should any problems arise concerning this procedure, the patient was given instructions to immediately contact us, at any time, without hesitation. In any case, we plan to contact the patient by telephone for a follow-up status report regarding this interventional procedure. Comments:  No additional relevant information.  Plan of Care  Disposition: Discharge home  Discharge Date & Time: 06/14/2017;   hrs.  Physician-requested Follow-up:  No Follow-up on file.  New Prescriptions   No medications on file   Future Appointments Date Time Provider Ivor  06/14/2017 9:45 AM Milinda Pointer, MD [facility] None  08/23/2017 11:00 AM Vevelyn Francois, NP [facility] None   Imaging Orders  No imaging studies ordered today   Procedure Orders    No procedure(s) ordered today   Medications ordered for procedure: No orders of the defined types were placed in this encounter.  Medications administered: Ms. Northrup had no medications administered during this visit.  See the medical record for exact dosing, route, and time of administration.  Primary Care Physician: [name], MD Location: [facility] Note by: Gaspar Cola, MD Date: 06/14/2017; Time: 8:10 AM  Disclaimer:  Medicine is not an Chief Strategy Officer. The only guarantee in medicine is that nothing is guaranteed. It is important to note that the decision to proceed with this intervention was based on the information collected from  the patient. The Data and conclusions were drawn from the patient's questionnaire, the interview, and the physical examination. Because the information was provided in large part by the patient, it cannot be guaranteed that it has not been purposely or unconsciously manipulated. Every effort has been made to obtain as much relevant data as possible for this evaluation. It is important to note that the conclusions that lead to this procedure are derived in large part from the available data. Always take into account that the treatment will also be dependent on availability of resources and existing treatment guidelines, considered by other Pain  Management Practitioners as being common knowledge and practice, at the time of the intervention. For Medico-Legal purposes, it is also important to point out that variation in procedural techniques and pharmacological choices are the acceptable norm. The indications, contraindications, technique, and results of the above procedure should only be interpreted and judged by a Board-Certified Interventional Pain Specialist with extensive familiarity and expertise in the same exact procedure and technique.

## 2017-06-17 ENCOUNTER — Encounter (HOSPITAL_COMMUNITY): Payer: Self-pay | Admitting: Emergency Medicine

## 2017-06-17 ENCOUNTER — Emergency Department (HOSPITAL_COMMUNITY)
Admission: EM | Admit: 2017-06-17 | Discharge: 2017-06-17 | Disposition: A | Discharge status: Home / Self Care | Payer: Medicare PPO | Attending: Emergency Medicine | Admitting: Emergency Medicine

## 2017-06-17 ENCOUNTER — Emergency Department (HOSPITAL_COMMUNITY): Payer: Medicare PPO

## 2017-06-17 DIAGNOSIS — R110 Nausea: Secondary | ICD-10-CM

## 2017-06-17 DIAGNOSIS — N390 Urinary tract infection, site not specified: Secondary | ICD-10-CM

## 2017-06-17 DIAGNOSIS — R531 Weakness: Secondary | ICD-10-CM | POA: Diagnosis present

## 2017-06-17 DIAGNOSIS — F17210 Nicotine dependence, cigarettes, uncomplicated: Secondary | ICD-10-CM | POA: Diagnosis not present

## 2017-06-17 DIAGNOSIS — Z79899 Other long term (current) drug therapy: Secondary | ICD-10-CM | POA: Insufficient documentation

## 2017-06-17 LAB — COMPREHENSIVE METABOLIC PANEL
ALT: 15 U/L (ref 14–54)
AST: 23 U/L (ref 15–41)
Albumin: 3.5 g/dL (ref 3.5–5.0)
Alkaline Phosphatase: 52 U/L (ref 38–126)
Anion gap: 5 (ref 5–15)
BUN: 10 mg/dL (ref 6–20)
CHLORIDE: 102 mmol/L (ref 101–111)
CO2: 32 mmol/L (ref 22–32)
Calcium: 8.6 mg/dL — ABNORMAL LOW (ref 8.9–10.3)
Creatinine, Ser: 0.78 mg/dL (ref 0.44–1.00)
GFR calc non Af Amer: >60 mL/min (ref >60)
Glucose, Bld: 97 mg/dL (ref 65–99)
POTASSIUM: 3.8 mmol/L (ref 3.5–5.1)
SODIUM: 139 mmol/L (ref 135–145)
Total Bilirubin: 0.4 mg/dL (ref 0.3–1.2)
Total Protein: 6.1 g/dL — ABNORMAL LOW (ref 6.5–8.1)

## 2017-06-17 LAB — CBC
HCT: 40.5 % (ref 36.0–46.0)
Hemoglobin: 14 g/dL (ref 12.0–15.0)
MCH: 32.9 pg (ref 26.0–34.0)
MCHC: 34.6 g/dL (ref 30.0–36.0)
MCV: 95.1 fL (ref 78.0–100.0)
PLATELETS: 153 10*3/uL (ref 150–400)
RBC: 4.26 MIL/uL (ref 3.87–5.11)
RDW: 13.4 % (ref 11.5–15.5)
WBC: 6 10*3/uL (ref 4.0–10.5)

## 2017-06-17 LAB — URINALYSIS, ROUTINE W REFLEX MICROSCOPIC
Bilirubin Urine: NEGATIVE
GLUCOSE, UA: NEGATIVE mg/dL
Hgb urine dipstick: NEGATIVE
Ketones, ur: 5 mg/dL — AB
Nitrite: NEGATIVE
PH: 6 (ref 5.0–8.0)
Protein, ur: NEGATIVE mg/dL
Specific Gravity, Urine: 1.012 (ref 1.005–1.030)

## 2017-06-17 LAB — CBG MONITORING, ED: Glucose-Capillary: 94 mg/dL (ref 65–99)

## 2017-06-17 LAB — TROPONIN I: Troponin I: <0.03 ng/mL (ref <0.03)

## 2017-06-17 MED ORDER — GI COCKTAIL ~~LOC~~
30.0000 mL | Freq: Once | ORAL | Status: AC
  Administered 2017-06-17: 30 mL via ORAL
  Filled 2017-06-17: qty 30

## 2017-06-17 MED ORDER — SODIUM CHLORIDE 0.9 % IV BOLUS (SEPSIS): 1000.0000 mL | Freq: Once | INTRAVENOUS | Status: DC

## 2017-06-17 MED ORDER — ONDANSETRON HCL 4 MG/2ML IJ SOLN
4.0000 mg | Freq: Four times a day (QID) | INTRAMUSCULAR | Status: DC | PRN
Start: 2017-06-17 — End: 2017-06-18

## 2017-06-17 MED ORDER — FAMOTIDINE IN NACL 20-0.9 MG/50ML-% IV SOLN
20.0000 mg | INTRAVENOUS | Status: AC
  Administered 2017-06-17: 20 mg via INTRAVENOUS
  Filled 2017-06-17: qty 50

## 2017-06-17 MED ORDER — ONDANSETRON 4 MG PO TBDP: 4.0000 mg | ORAL_TABLET | Freq: Three times a day (TID) | ORAL | 0 refills | Status: DC | PRN

## 2017-06-17 MED ORDER — SULFAMETHOXAZOLE-TRIMETHOPRIM 800-160 MG PO TABS: 1.0000 | ORAL_TABLET | Freq: Two times a day (BID) | ORAL | 0 refills | Status: DC

## 2017-06-17 NOTE — Discharge Instructions (Signed)
Bactrim twice daily for 5 days Zofran as needed every 6 hours for nausea Drink plenty of fluids.  ER for worsening symptoms. See your doctor this week

## 2017-06-17 NOTE — ED Notes (Signed)
Pt requested and was given jello

## 2017-06-17 NOTE — ED Notes (Signed)
Dr Jerilynn Mages in to discuss

## 2017-06-17 NOTE — ED Triage Notes (Signed)
Pt c/o generalized weakness and poor appetite x 1 week. Pt also reports chest and back pain x 4 days.

## 2017-06-17 NOTE — ED Notes (Signed)
IV of pt noted to be going very slowly- IV repositioned, flushed and now is running well

## 2017-06-17 NOTE — ED Provider Notes (Signed)
[facility] DEPT Provider Note   CSN: [id number] Arrival date & time: 06/17/17  1439     History   Chief Complaint Chief Complaint  Patient presents with  . Weakness    HPI JENNILYN ESTEVE is a 76 y.o. female.  HPI  The patient is a 76 year old female, she has a known history of an ascending aortic aneurysm as well as a prior history of hyperparathyroidism status post parathyroidectomy, history of some difficulty walking which is not newand a history of multiple back surgeries and bilateral knee arthroplasty. She has had urinary tract infections, rectal prolapse status post repair but denies any history of hypertension, diabetes, cardiac disease or strokes. She reports that over the last 5 days she has developed increased amounts of nausea, poor appetite, has had very little to eat and is feeling generally weak. This is not focal, lateralizing or identifiable with regards to location and she states it is more of a fatigue and severe lethargy. She has been able to get up and walk though she states that she has some difficulty, not related to vertigo, lightheadedness or near-syncope. She has no weakness of the arms or legs lateralizing, has no urinary symptoms including frequency or dysuria in fact she has had decreased urinary output. There is no chest pain coughing shortness of breath fevers chills or diarrhea. She has had persistent nausea, she has tried taking Tums which has not helped. She denies rashes take bites or other integumentary system problems. She has not been seen by her family doctor.  Past Medical History:  Diagnosis Date  . Anxiety   . Aortic aneurysm ([place]) 06/06/2016  . Cataract   . Chest pain 08/18/2015  . Decreased muscle strength 02/10/2014  . Depression   . Difficulty in walking(719.7) 02/10/2014  . Dysphagia 08/19/2015  . Dyspnea 08/19/2015  . Hyperparathyroidism, primary ([place])    s/p parathyroidectomy  . Hyperventilation 08/18/2015  . Insomnia   . Intractable  back pain 04/29/2014  . Kidney stone   . Lumbar spinal stenosis    s/p laminectomy  . Odynophagia 08/18/2015  . Osteoarthritis of both knees    s/p knee replacements  . Pain   . Rectal prolapse    s/p repair  . Unintentional weight loss 09/08/2015  . UTI (lower urinary tract infection) 04/28/2014    Patient Active Problem List   Diagnosis Date Noted  . 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 Osteoarthritis of ankle or foot 05/25/2017  . Trochanteric bursitis of left hip 05/25/2017  . Osteoarthritis of hip (Left) 04/25/2017  . Acute postoperative pain 02/14/2017  . Musculoskeletal pain 01/23/2017  . Tremor 01/10/2017  . Chronic pain syndrome 11/13/2016  . Chronic upper back pain (midline) 09/13/2016  . Lumbar spondylosis 08/15/2016  . Lumbar facet syndrome (Bilateral) (R>L) 07/19/2016  . Chronic groin pain (Location of Tertiary source of pain) (Left) 07/13/2016  . 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 Opioid-induced constipation (OIC) 07/12/2016  . Osteoarthritis, multiple sites 07/11/2016  . Chronic low back pain (Location of Primary Source of Pain) (Bilateral) (R>L) 07/11/2016  . 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 Chronic knee pain (Bilateral) (L>R) 07/11/2016  . Long term current use of opiate analgesic 07/11/2016  . Long term prescription opiate use 07/11/2016  . Opiate use (60 MME/Day) 07/11/2016  . Encounter for pain management planning 07/11/2016  . Encounter for therapeutic drug level monitoring 07/11/2016  . Abnormal CT scan, lumbar spine (05/07/2015) 07/11/2016  . Osteoarthritis  of sacroiliac joint (Bilateral) 07/11/2016  . Lumbar facet arthropathy ([place]) 07/11/2016  . Lumbar spondylolisthesis 07/11/2016  . Lumbar foraminal stenosis (Bilateral) 07/11/2016  . History of total bilateral knee replacement 07/11/2016  . Aortic aneurysm ([place]) 06/06/2016  . Hyperlipidemia 03/06/2016  . Chronic hip pain (Location of Secondary  source of pain) (Left) 12/06/2015  . Solitary pulmonary nodule 09/10/2015  . Mechanical complication of internal fixation device such as nail, plate or rod ([place]) 09/73/5329  . 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 Pseudarthrosis following spinal fusion 07/29/2015  . Generalized weakness 06/02/2014  . History of lumbar fusion (T10 through L3 fusion) 04/28/2014  . Obesity (BMI 30-39.9) 06/23/2013  . Anxiety and depression   . Lumbar spinal stenosis     Past Surgical History:  Procedure Laterality Date  . ABDOMINAL HYSTERECTOMY    . APPENDECTOMY    . BACK SURGERY  07-2015   major surgery  . BREAST LUMPECTOMY     Reports this is benign  . colonoscopy  2013  . KNEE SURGERY Bilateral 1990's nd 2002   Knee Replacements  . PARATHYROIDECTOMY  1990's  . SPINE SURGERY    . TONSILLECTOMY      OB History    No data available       Home Medications    Prior to Admission medications   Medication Sig Start Date End Date Taking? Authorizing Provider  Ascorbic Acid (VITAMIN C) 500 MG CAPS Take by mouth daily.   Yes [provider]  cyclobenzaprine (FLEXERIL) 10 MG tablet Take 1 tablet (10 mg total) by mouth at bedtime. Patient taking differently: Take 10 mg by mouth at bedtime as needed.  04/24/17 08/22/17 Yes Vevelyn Francois, NP  DULoxetine (CYMBALTA) 60 MG capsule TAKE 1 CAPSULE EVERY DAY 02/08/17  Yes [name], MD  fish oil-omega-3 fatty acids 1000 MG capsule Take 1 g by mouth daily.    Yes [provider]  gabapentin (NEURONTIN) 300 MG capsule Take 300 mg (one tablet) by mouth in the morning and take 600 mg (2 tablets) by mouth at night Patient taking differently: Take 600 mg by mouth at bedtime as needed. Take 300 mg (one tablet) by mouth in the morning and take 600 mg (2 tablets) by mouth at night 02/13/17  Yes [name], MD  HYDROcodone-acetaminophen (NORCO/VICODIN) 5-325 MG tablet Take 1 tablet by mouth 3 (three) times daily as  needed. 02/15/17  Yes [provider]  lubiprostone (AMITIZA) 8 MCG capsule Take 1 capsule (8 mcg total) by mouth 2 (two) times daily with a meal. Swallow the medication whole. Do not break or chew the medication. 07/19/16  Yes Milinda Pointer, MD  Melatonin 5 MG TABS Take 1 tablet by mouth at bedtime as needed.    Yes [provider]  Misc Natural Products (OSTEO BI-FLEX ADV TRIPLE ST PO) Take 1 tablet by mouth daily.    Yes [provider]  Multiple Vitamins-Minerals (WOMENS MULTI) CAPS Take one tablet once daily   Yes [provider]  naloxone [facility]) 2 MG/2ML injection Inject content of syringe into thigh muscle. Call 911. 07/19/16  Yes Milinda Pointer, MD  Oxycodone HCl 10 MG TABS Take 1 tablet (10 mg total) by mouth every 6 (six) hours as needed. 08/06/17 09/05/17 Yes Vevelyn Francois, NP  primidone (MYSOLINE) 50 MG tablet Take by mouth 25mg  (1/2 tablet) nightly for two weeks then 25mg  (1/2 tablet) two times a day. 01/11/17  Yes [provider]  Red Yeast Rice 600 MG CAPS Take 2 capsules (1,200 mg total) by mouth daily. 06/06/16  Yes [name], MD  vitamin B-12 (CYANOCOBALAMIN) 1000 MCG tablet Take 1,000 mcg by mouth every other day.  07/20/15  Yes [provider]  Wheat Dextrin (BENEFIBER) POWD Stir 2 tsp. TID into 4-8 oz of any non-carbonated beverage or soft food (hot or cold) 07/19/16  Yes Milinda Pointer, MD  ondansetron (ZOFRAN ODT) 4 MG disintegrating tablet Take 1 tablet (4 mg total) by mouth every 8 (eight) hours as needed for nausea. 06/17/17   Noemi Chapel, MD  sulfamethoxazole-trimethoprim (BACTRIM DS,SEPTRA DS) 800-160 MG tablet Take 1 tablet by mouth 2 (two) times daily. 06/17/17 06/22/17  Noemi Chapel, MD    Family History Family History  Problem Relation Age of Onset  . Mental illness Mother   . COPD Father   . Heart failure Father   . Diabetes Son     Social History Social History  Substance Use Topics  .  Smoking status: Current Every Day Smoker    Packs/day: 0.50    Years: 50.00    Types: Cigarettes  . Smokeless tobacco: Never Used  . Alcohol use No     Allergies   Fentanyl; Nsaids; and Pentothal [thiopental]   Review of Systems Review of Systems  All other systems reviewed and are negative.    Physical Exam Updated Vital Signs BP 120/65   Pulse 82   Temp 97.8 F (36.6 C) (Oral)   Resp (!) 24   Ht 5\' 2"  (1.575 m)   Wt 58.1 kg (128 lb)   SpO2 96%   BMI 23.41 kg/m   Physical Exam  Constitutional: She appears well-developed and well-nourished. No distress.  HENT:  Head: Normocephalic and atraumatic.  Mouth/Throat: No oropharyngeal exudate.  Mucous membranes are dehydrated, no exudate asymmetry or hypertrophy, midline uvula  Eyes: Pupils are equal, round, and reactive to light. Conjunctivae and EOM are normal. Right eye exhibits no discharge. Left eye exhibits no discharge. No scleral icterus.  Neck: Normal range of motion. Neck supple. No JVD present. No thyromegaly present.  Cardiovascular: Normal rate, regular rhythm, normal heart sounds and intact distal pulses.  Exam reveals no gallop and no friction rub.   No murmur heard. Pulmonary/Chest: Effort normal and breath sounds normal. No respiratory distress. She has no wheezes. She has no rales.  Abdominal: Soft. Bowel sounds are normal. She exhibits no distension and no mass. There is no tenderness.  The abdomen is completely soft and nontender  Musculoskeletal: Normal range of motion. She exhibits no edema or tenderness.  , soft compartments and supple joints diffusely, no edema of the lower extremities  Lymphadenopathy:    She has no cervical adenopathy.  Neurological: She is alert. Coordination normal.  Clear speech, coordinated movements, normal strength in all 4 extremities, cranial nerves III through XII are normal, speech is clear and goal-directed  Skin: Skin is warm and dry. No rash noted. No erythema.    Psychiatric: She has a normal mood and affect. Her behavior is normal.  Nursing note and vitals reviewed.    ED Treatments / Results  Labs (all labs ordered are listed, but only abnormal results are displayed) Labs Reviewed  URINALYSIS, ROUTINE W REFLEX MICROSCOPIC - Abnormal; Notable for the following:       Result Value   APPearance HAZY (*)    Ketones, ur 5 (*)    Leukocytes, UA MODERATE (*)    Bacteria, UA RARE (*)  Squamous Epithelial / LPF 6-30 (*)    All other components within normal limits  COMPREHENSIVE METABOLIC PANEL - Abnormal; Notable for the following:    Calcium 8.6 (*)    Total Protein 6.1 (*)    All other components within normal limits  URINE CULTURE  CBC  TROPONIN I  CBG MONITORING, ED    EKG  EKG Interpretation None       Radiology Dg Chest Port 1 View  Result Date: 06/17/2017 CLINICAL DATA:  Chest pressure for 1 week. EXAM: PORTABLE CHEST 1 VIEW COMPARISON:  April 28, 2014 FINDINGS: There is a prominent skin fold over the left apex. No pneumothorax. Surgical hardware seen in the thoracolumbar spine. The heart size is borderline. The hila and mediastinum are normal. No nodules or masses. No focal infiltrates. No overt edema. IMPRESSION: No acute abnormalities identified. Electronically Signed   By: Dorise Bullion III M.D   On: 06/17/2017 17:54    Procedures Procedures (including critical care time)  Medications Ordered in ED Medications  ondansetron [facility]) injection 4 mg (not administered)  sodium chloride 0.9 % bolus 1,000 mL (1,000 mLs Intravenous Given by EMS 06/17/17 1536)  gi cocktail (Maalox,Lidocaine,Donnatal) (30 mLs Oral Given 06/17/17 1742)  famotidine (PEPCID) IVPB 20 mg premix (0 mg Intravenous Stopped 06/17/17 1825)     Initial Impression / Assessment and Plan / ED Course  I have reviewed the triage vital signs and the nursing notes.  Pertinent labs & imaging results that were available during my care of the patient were  reviewed by me and considered in my medical decision making (see chart for details).     Because of the patient's generalized weakness is unclear, laboratory workup with urinalysis EKG and troponin will be needed, Zofran offered but patient declined at this time. When necessary order will be initiated, IV fluids will be given, the patient does not have any signs of lateralizing central neurologic condition.  Labs reassuring Possible UTI - culture sent - Bactrim Rx Zofran for nausea Fluids given pepcid given Stable for d/c.  Final Clinical Impressions(s) / ED Diagnoses   Final diagnoses:  Nauseated  Urinary tract infection without hematuria, site unspecified    New Prescriptions New Prescriptions   ONDANSETRON (ZOFRAN ODT) 4 MG DISINTEGRATING TABLET    Take 1 tablet (4 mg total) by mouth every 8 (eight) hours as needed for nausea.   SULFAMETHOXAZOLE-TRIMETHOPRIM (BACTRIM DS,SEPTRA DS) 800-160 MG TABLET    Take 1 tablet by mouth 2 (two) times daily.     Noemi Chapel, MD 06/17/17 [phone]

## 2017-06-17 NOTE — ED Notes (Signed)
Awaiting recheck and dispo

## 2017-06-17 NOTE — ED Notes (Signed)
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

## 2017-06-18 ENCOUNTER — Emergency Department: Payer: Medicare PPO

## 2017-06-18 ENCOUNTER — Telehealth: Payer: Self-pay | Admitting: Family Medicine

## 2017-06-18 ENCOUNTER — Encounter: Payer: Self-pay | Admitting: Emergency Medicine

## 2017-06-18 ENCOUNTER — Emergency Department
Admission: EM | Admit: 2017-06-18 | Discharge: 2017-06-18 | Disposition: A | Discharge status: Home / Self Care | Payer: Medicare PPO | Attending: Emergency Medicine | Admitting: Emergency Medicine

## 2017-06-18 DIAGNOSIS — R1013 Epigastric pain: Secondary | ICD-10-CM | POA: Diagnosis not present

## 2017-06-18 DIAGNOSIS — Z96651 Presence of right artificial knee joint: Secondary | ICD-10-CM | POA: Diagnosis not present

## 2017-06-18 DIAGNOSIS — Z79899 Other long term (current) drug therapy: Secondary | ICD-10-CM | POA: Insufficient documentation

## 2017-06-18 DIAGNOSIS — R5383 Other fatigue: Secondary | ICD-10-CM | POA: Diagnosis present

## 2017-06-18 DIAGNOSIS — F17210 Nicotine dependence, cigarettes, uncomplicated: Secondary | ICD-10-CM | POA: Insufficient documentation

## 2017-06-18 DIAGNOSIS — R110 Nausea: Secondary | ICD-10-CM | POA: Diagnosis not present

## 2017-06-18 LAB — COMPREHENSIVE METABOLIC PANEL
ALBUMIN: 4 g/dL (ref 3.5–5.0)
ALT: 17 U/L (ref 14–54)
ANION GAP: 9 (ref 5–15)
AST: 31 U/L (ref 15–41)
Alkaline Phosphatase: 55 U/L (ref 38–126)
BUN: 7 mg/dL (ref 6–20)
CHLORIDE: 105 mmol/L (ref 101–111)
CO2: 25 mmol/L (ref 22–32)
Calcium: 9.2 mg/dL (ref 8.9–10.3)
Creatinine, Ser: 0.74 mg/dL (ref 0.44–1.00)
GFR calc Af Amer: >60 mL/min (ref >60)
GFR calc non Af Amer: >60 mL/min (ref >60)
GLUCOSE: 88 mg/dL (ref 65–99)
Potassium: 3.8 mmol/L (ref 3.5–5.1)
SODIUM: 139 mmol/L (ref 135–145)
TOTAL PROTEIN: 6.7 g/dL (ref 6.5–8.1)
Total Bilirubin: 0.4 mg/dL (ref 0.3–1.2)

## 2017-06-18 LAB — CBC
HCT: 43.8 % (ref 35.0–47.0)
HEMOGLOBIN: 15.3 g/dL (ref 12.0–16.0)
MCH: 33 pg (ref 26.0–34.0)
MCHC: 35 g/dL (ref 32.0–36.0)
MCV: 94.4 fL (ref 80.0–100.0)
Platelets: 153 10*3/uL (ref 150–440)
RBC: 4.64 MIL/uL (ref 3.80–5.20)
RDW: 13.5 % (ref 11.5–14.5)
WBC: 7.5 10*3/uL (ref 3.6–11.0)

## 2017-06-18 LAB — URINALYSIS, COMPLETE (UACMP) WITH MICROSCOPIC
BACTERIA UA: NONE SEEN
Bilirubin Urine: NEGATIVE
Glucose, UA: NEGATIVE mg/dL
HGB URINE DIPSTICK: NEGATIVE
Ketones, ur: NEGATIVE mg/dL
NITRITE: NEGATIVE
PROTEIN: NEGATIVE mg/dL
Specific Gravity, Urine: 1.008 (ref 1.005–1.030)
pH: 7 (ref 5.0–8.0)

## 2017-06-18 LAB — LIPASE, BLOOD: LIPASE: 33 U/L (ref 11–51)

## 2017-06-18 MED ORDER — SODIUM CHLORIDE 0.9 % IV BOLUS (SEPSIS)
1000.0000 mL | Freq: Once | INTRAVENOUS | Status: AC
  Administered 2017-06-18: 1000 mL via INTRAVENOUS

## 2017-06-18 MED ORDER — FAMOTIDINE 20 MG PO TABS: 20.0000 mg | ORAL_TABLET | Freq: Two times a day (BID) | ORAL | 0 refills | Status: DC

## 2017-06-18 MED ORDER — IOPAMIDOL (ISOVUE-300) INJECTION 61%
75.0000 mL | Freq: Once | INTRAVENOUS | Status: AC | PRN
  Administered 2017-06-18: 75 mL via INTRAVENOUS

## 2017-06-18 MED ORDER — ONDANSETRON HCL 4 MG/2ML IJ SOLN
4.0000 mg | Freq: Once | INTRAMUSCULAR | Status: AC
  Administered 2017-06-18: 4 mg via INTRAVENOUS
  Filled 2017-06-18: qty 2

## 2017-06-18 NOTE — ED Provider Notes (Signed)
[facility] Emergency Department Provider Note  ____________________________________________   None    (approximate)  I have reviewed the triage vital signs and the nursing notes.   HISTORY  Chief Complaint Emesis    HPI Brittany Mays is a 76 y.o. female who self presents to the emergency department with several days of fatigue and nausea. No fevers or chills. No back pain. No diarrhea. She has decreased oral intake. She was seen [facility] emergency department yesterday and diagnosed with urinary tract infection and given a prescription for Bactrim which she has yet to fill. She returns to the emergency department today with generalized fatigue. Nothing seems to make her pain better or worse.She does report moderate severity epigastric burning discomfort which she thinks is related to gastric reflux.   Past Medical History:  Diagnosis Date  . Anxiety   . Aortic aneurysm ([place]) 06/06/2016  . Cataract   . Chest pain 08/18/2015  . Decreased muscle strength 02/10/2014  . Depression   . Difficulty in walking(719.7) 02/10/2014  . Dysphagia 08/19/2015  . Dyspnea 08/19/2015  . Hyperparathyroidism, primary ([place])    s/p parathyroidectomy  . Hyperventilation 08/18/2015  . Insomnia   . Intractable back pain 04/29/2014  . Kidney stone   . Lumbar spinal stenosis    s/p laminectomy  . Odynophagia 08/18/2015  . Osteoarthritis of both knees    s/p knee replacements  . Pain   . Rectal prolapse    s/p repair  . Unintentional weight loss 09/08/2015  . UTI (lower urinary tract infection) 04/28/2014    Patient Active Problem List   Diagnosis Date Noted  . 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 Osteoarthritis of ankle or foot 05/25/2017  . Trochanteric bursitis of left hip 05/25/2017  . Osteoarthritis of hip (Left) 04/25/2017  . Acute postoperative pain 02/14/2017  . Musculoskeletal pain 01/23/2017  . Tremor 01/10/2017  . Chronic pain syndrome  11/13/2016  . Chronic upper back pain (midline) 09/13/2016  . Lumbar spondylosis 08/15/2016  . Lumbar facet syndrome (Bilateral) (R>L) 07/19/2016  . Chronic groin pain (Location of Tertiary source of pain) (Left) 07/13/2016  . 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 Opioid-induced constipation (OIC) 07/12/2016  . Osteoarthritis, multiple sites 07/11/2016  . Chronic low back pain (Location of Primary Source of Pain) (Bilateral) (R>L) 07/11/2016  . 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 Chronic knee pain (Bilateral) (L>R) 07/11/2016  . Long term current use of opiate analgesic 07/11/2016  . Long term prescription opiate use 07/11/2016  . Opiate use (60 MME/Day) 07/11/2016  . Encounter for pain management planning 07/11/2016  . Encounter for therapeutic drug level monitoring 07/11/2016  . Abnormal CT scan, lumbar spine (05/07/2015) 07/11/2016  . Osteoarthritis of sacroiliac joint (Bilateral) 07/11/2016  . Lumbar facet arthropathy ([place]) 07/11/2016  . Lumbar spondylolisthesis 07/11/2016  . Lumbar foraminal stenosis (Bilateral) 07/11/2016  . History of total bilateral knee replacement 07/11/2016  . Aortic aneurysm ([place]) 06/06/2016  . Hyperlipidemia 03/06/2016  . Chronic hip pain (Location of Secondary source of pain) (Left) 12/06/2015  . Solitary pulmonary nodule 09/10/2015  . Mechanical complication of internal fixation device such as nail, plate or rod ([place]) 43/15/4008  . 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 Pseudarthrosis following spinal fusion 07/29/2015  . Generalized weakness 06/02/2014  . History of lumbar fusion (T10 through L3 fusion) 04/28/2014  . Obesity (BMI 30-39.9) 06/23/2013  . Anxiety and depression   . Lumbar spinal  stenosis     Past Surgical History:  Procedure Laterality Date  . ABDOMINAL HYSTERECTOMY    . APPENDECTOMY    . BACK SURGERY  07-2015   major surgery  . BREAST LUMPECTOMY     Reports this is benign  .  colonoscopy  2013  . KNEE SURGERY Bilateral 1990's nd 2002   Knee Replacements  . PARATHYROIDECTOMY  1990's  . SPINE SURGERY    . TONSILLECTOMY      Prior to Admission medications   Medication Sig Start Date End Date Taking? Authorizing Provider  Ascorbic Acid (VITAMIN C) 500 MG CAPS Take by mouth daily.    [provider]  cyclobenzaprine (FLEXERIL) 10 MG tablet Take 1 tablet (10 mg total) by mouth at bedtime. Patient taking differently: Take 10 mg by mouth at bedtime as needed.  04/24/17 08/22/17  Vevelyn Francois, NP  DULoxetine (CYMBALTA) 60 MG capsule TAKE 1 CAPSULE EVERY DAY 02/08/17   [name], MD  famotidine (PEPCID) 20 MG tablet Take 1 tablet (20 mg total) by mouth 2 (two) times daily. 06/18/17 06/18/18  Darel Hong, MD  fish oil-omega-3 fatty acids 1000 MG capsule Take 1 g by mouth daily.     [provider]  gabapentin (NEURONTIN) 300 MG capsule Take 300 mg (one tablet) by mouth in the morning and take 600 mg (2 tablets) by mouth at night Patient taking differently: Take 600 mg by mouth at bedtime as needed. Take 300 mg (one tablet) by mouth in the morning and take 600 mg (2 tablets) by mouth at night 02/13/17   [name], MD  HYDROcodone-acetaminophen (NORCO/VICODIN) 5-325 MG tablet Take 1 tablet by mouth 3 (three) times daily as needed. 02/15/17   [provider]  lubiprostone (AMITIZA) 8 MCG capsule Take 1 capsule (8 mcg total) by mouth 2 (two) times daily with a meal. Swallow the medication whole. Do not break or chew the medication. 07/19/16   Milinda Pointer, MD  Melatonin 5 MG TABS Take 1 tablet by mouth at bedtime as needed.     [provider]  Misc Natural Products (OSTEO BI-FLEX ADV TRIPLE ST PO) Take 1 tablet by mouth daily.     [provider]  Multiple Vitamins-Minerals (WOMENS MULTI) CAPS Take one tablet once daily    [provider]  naloxone [facility]) 2 MG/2ML injection Inject content of syringe  into thigh muscle. Call 911. 07/19/16   Milinda Pointer, MD  ondansetron (ZOFRAN ODT) 4 MG disintegrating tablet Take 1 tablet (4 mg total) by mouth every 8 (eight) hours as needed for nausea. 06/17/17   Noemi Chapel, MD  Oxycodone HCl 10 MG TABS Take 1 tablet (10 mg total) by mouth every 6 (six) hours as needed. 08/06/17 09/05/17  Vevelyn Francois, NP  primidone (MYSOLINE) 50 MG tablet Take by mouth 25mg  (1/2 tablet) nightly for two weeks then 25mg  (1/2 tablet) two times a day. 01/11/17   [provider]  Red Yeast Rice 600 MG CAPS Take 2 capsules (1,200 mg total) by mouth daily. 06/06/16   [name], MD  vitamin B-12 (CYANOCOBALAMIN) 1000 MCG tablet Take 1,000 mcg by mouth every other day.  07/20/15   [provider]  Wheat Dextrin (BENEFIBER) POWD Stir 2 tsp. TID into 4-8 oz of any non-carbonated beverage or soft food (hot or cold) 07/19/16   Milinda Pointer, MD    Allergies Fentanyl; Nsaids; and Pentothal [thiopental]  Family History  Problem Relation Age of  Onset  . Mental illness Mother   . COPD Father   . Heart failure Father   . Diabetes Son     Social History Social History  Substance Use Topics  . Smoking status: Current Every Day Smoker    Packs/day: 0.50    Years: 50.00    Types: Cigarettes  . Smokeless tobacco: Never Used  . Alcohol use No    Review of Systems Constitutional: No fever/chills Eyes: No visual changes. ENT: No sore throat. Cardiovascular: Denies chest pain. Respiratory: Denies shortness of breath. Gastrointestinal: Positive abdominal pain.  Positive nausea, no vomiting.  No diarrhea.  No constipation. Genitourinary: Negative for dysuria. Musculoskeletal: Negative for back pain. Skin: Negative for rash. Neurological: Negative for headaches, focal weakness or numbness.   ____________________________________________   PHYSICAL EXAM:  VITAL SIGNS: ED Triage Vitals  Enc Vitals Group     BP 06/18/17 1555 124/65      Pulse Rate 06/18/17 1555 88     Resp 06/18/17 1555 (!) 22     Temp 06/18/17 1555 98.2 F (36.8 C)     Temp Source 06/18/17 1555 Oral     SpO2 06/18/17 1555 99 %     Weight 06/18/17 1557 124 lb (56.2 kg)     Height 06/18/17 1557 5' (1.524 m)     Head Circumference --      Peak Flow --      Pain Score 06/18/17 1606 0     Pain Loc --      Pain Edu? --      Excl. in [place]? --     Constitutional: Alert and oriented 4 well appearing nontoxic no diaphoresis speaks full clear sentences Eyes: PERRL EOMI. Head: Atraumatic. Nose: No congestion/rhinnorhea. Mouth/Throat: No trismus Neck: No stridor.   Cardiovascular: Normal rate, regular rhythm. Grossly normal heart sounds.  Good peripheral circulation. Respiratory: Normal respiratory effort.  No retractions. Lungs CTAB and moving good air Gastrointestinal: Soft nondistended mild diffuse tenderness without focality no peritonitis Musculoskeletal: No lower extremity edema   Neurologic:  Normal speech and language. No gross focal neurologic deficits are appreciated. Skin:  Skin is warm, dry and intact. No rash noted. Psychiatric: Mood and affect are normal. Speech and behavior are normal.    _____________________________________   LABS (all labs ordered are listed, but only abnormal results are displayed)  Labs Reviewed  URINALYSIS, COMPLETE (UACMP) WITH MICROSCOPIC - Abnormal; Notable for the following:       Result Value   Color, Urine YELLOW (*)    APPearance HAZY (*)    Leukocytes, UA MODERATE (*)    Squamous Epithelial / LPF 0-5 (*)    All other components within normal limits  LIPASE, BLOOD  COMPREHENSIVE METABOLIC PANEL  CBC    No evidence of UTI __________________________________________  ____________________________________________  RADIOLOGY  CT scan with no acute disease ____________________________________________   PROCEDURES  Procedure(s) performed: no  Procedures  Critical Care performed:  no  Observation: no ____________________________________________   INITIAL IMPRESSION / ASSESSMENT AND PLAN / ED COURSE  Pertinent labs & imaging results that were available during my care of the patient were reviewed by me and considered in my medical decision making (see chart for details).       ----------------------------------------- 7:27 PM on 06/18/2017 -----------------------------------------  Fortunately the patient's CT scan is negative for acute pathology. Her urinalysis today is a better sample than she had yesterday and has no evidence of infection. I've advised her to stop taking Bactrim. She  does report that her symptoms are associated with recent gastric reflux which she has never had before. I've encouraged her to begin taking famotidine twice a day and to follow-up with gastroenterology in the next 2 weeks for reevaluation and possible endoscopy. She is discharged home in improved condition. ___________________________________   FINAL CLINICAL IMPRESSION(S) / ED DIAGNOSES  Final diagnoses:  Epigastric pain  Nausea      NEW MEDICATIONS STARTED DURING THIS VISIT:  Discharge Medication List as of 06/18/2017  7:25 PM    START taking these medications   Details  famotidine (PEPCID) 20 MG tablet Take 1 tablet (20 mg total) by mouth 2 (two) times daily., Starting Mon 06/18/2017, Until Tue 06/18/2018, Print         Note:  This document was prepared using Dragon voice recognition software and may include unintentional dictation errors.     Darel Hong, MD 06/18/17 [id number]

## 2017-06-18 NOTE — ED Notes (Signed)
First Nurse: pt was seen at [facility] yesterday for possible dehydration, returns today for same.

## 2017-06-18 NOTE — ED Triage Notes (Signed)
Pt reports vomiting x1 week; was seen at [facility] last night and given 1L NS bolus, GI cocktail and pepcid IV. Pt was told she has a mild UTI, given home rx for bactrim. Pt reports feeling bloated, no obvious abdominal distention noted. Pt reports small bowel movement this AM, reports it was formed and whitish/gray in color.

## 2017-06-18 NOTE — Telephone Encounter (Signed)
Agree with the ED evaluation. 

## 2017-06-18 NOTE — Telephone Encounter (Signed)
Pt complaining of severe stomach pains with some nausea. No vomiting. Patient was upset crying on the phone and stated she felt like she was dying. Patient has not had ate/drank anything x5 days. Patient stated that she cannot afford the antibiotic an zofran that she was prescibed in the ED. Advised patient to return to the ED for further evaluation.

## 2017-06-18 NOTE — Discharge Instructions (Signed)
Fortunately today your blood work urinalysis and CT scan were all reassuring. Please take Pepcid twice a day for the next month to see if it helps your  symptoms. Please also make an appointment to follow-up with gastroenterology in the next several weeks for a reexamination. Return to the emergency department sooner for any concerns.  It was a pleasure to take care of you today, and thank you for coming to our emergency department.  If you have any questions or concerns before leaving please ask the nurse to grab me and I'm more than happy to go through your aftercare instructions again.  If you were prescribed any opioid pain medication today such as Norco, Vicodin, Percocet, morphine, hydrocodone, or oxycodone please make sure you do not drive when you are taking this medication as it can alter your ability to drive safely.  If you have any concerns once you are home that you are not improving or are in fact getting worse before you can make it to your follow-up appointment, please do not hesitate to call 911 and come back for further evaluation.  Darel Hong, MD  Results for orders placed or performed during the hospital encounter of 06/18/17  Lipase, blood  Result Value Ref Range   Lipase 33 11 - 51 U/L  Comprehensive metabolic panel  Result Value Ref Range   Sodium 139 135 - 145 mmol/L   Potassium 3.8 3.5 - 5.1 mmol/L   Chloride 105 101 - 111 mmol/L   CO2 25 22 - 32 mmol/L   Glucose, Bld 88 65 - 99 mg/dL   BUN 7 6 - 20 mg/dL   Creatinine, Ser 0.74 0.44 - 1.00 mg/dL   Calcium 9.2 8.9 - 10.3 mg/dL   Total Protein 6.7 6.5 - 8.1 g/dL   Albumin 4.0 3.5 - 5.0 g/dL   AST 31 15 - 41 U/L   ALT 17 14 - 54 U/L   Alkaline Phosphatase 55 38 - 126 U/L   Total Bilirubin 0.4 0.3 - 1.2 mg/dL   GFR calc non Af Amer >60 >60 mL/min   GFR calc Af Amer >60 >60 mL/min   Anion gap 9 5 - 15  CBC  Result Value Ref Range   WBC 7.5 3.6 - 11.0 K/uL   RBC 4.64 3.80 - 5.20 MIL/uL   Hemoglobin 15.3  12.0 - 16.0 g/dL   HCT 43.8 35.0 - 47.0 %   MCV 94.4 80.0 - 100.0 fL   MCH 33.0 26.0 - 34.0 pg   MCHC 35.0 32.0 - 36.0 g/dL   RDW 13.5 11.5 - 14.5 %   Platelets 153 150 - 440 K/uL  Urinalysis, Complete w Microscopic  Result Value Ref Range   Color, Urine YELLOW (A) YELLOW   APPearance HAZY (A) CLEAR   Specific Gravity, Urine 1.008 1.005 - 1.030   pH 7.0 5.0 - 8.0   Glucose, UA NEGATIVE NEGATIVE mg/dL   Hgb urine dipstick NEGATIVE NEGATIVE   Bilirubin Urine NEGATIVE NEGATIVE   Ketones, ur NEGATIVE NEGATIVE mg/dL   Protein, ur NEGATIVE NEGATIVE mg/dL   Nitrite NEGATIVE NEGATIVE   Leukocytes, UA MODERATE (A) NEGATIVE   RBC / HPF 0-5 0 - 5 RBC/hpf   WBC, UA 0-5 0 - 5 WBC/hpf   Bacteria, UA NONE SEEN NONE SEEN   Squamous Epithelial / LPF 0-5 (A) NONE SEEN   Mucous PRESENT    Ct Abdomen Pelvis W Contrast  Result Date: 06/18/2017 CLINICAL DATA:  Vomiting for 1 week.  Possibly urinary tract infection. Evaluate for peritonitis. EXAM: CT ABDOMEN AND PELVIS WITH CONTRAST TECHNIQUE: Multidetector CT imaging of the abdomen and pelvis was performed using the standard protocol following bolus administration of intravenous contrast. CONTRAST:  92mL ISOVUE-300 IOPAMIDOL (ISOVUE-300) INJECTION 61% COMPARISON:  09/10/2015 FINDINGS: Lower chest: Clear lung bases. Normal heart size without pericardial or pleural effusion. Fat containing right-sided Bochdalek's hernia. Hepatobiliary: Focal steatosis adjacent the falciform ligament. Normal gallbladder, without biliary ductal dilatation. Pancreas: Mild pancreatic atrophy, without mass or duct dilatation. Spleen: Normal in size, without focal abnormality. Adrenals/Urinary Tract: Normal adrenal glands. Beam hardening artifact secondary to spinal hardware degrades evaluation of the abdomen, especially the retroperitoneum. Too small to characterize lesions in both kidneys, without hydronephrosis. Normal urinary bladder. Stomach/Bowel: Proximal gastric  underdistention. Normal small bowel. Vascular/Lymphatic: Aortic and branch vessel atherosclerosis. No abdominopelvic adenopathy. Reproductive: Hysterectomy.  No adnexal mass. Other: No significant free fluid. Mild to moderate pelvic floor laxity. Fat containing periumbilical abdominal wall laxity as well. Fat containing small bilateral inguinal hernias. Musculoskeletal: Left hip osteoarthritis. Osteopenia. Grade 1 L4-5 anterolisthesis with lumbosacral degenerative disc disease. Thoracolumbar trans pedicle screw fixation. IMPRESSION: 1.  No acute process in the abdomen or pelvis. 2.  Aortic Atherosclerosis (ICD10-I70.0). 3. Pelvic floor laxity. Electronically Signed   By: Abigail Miyamoto M.D.   On: 06/18/2017 18:31   Dg Chest Port 1 View  Result Date: 06/17/2017 CLINICAL DATA:  Chest pressure for 1 week. EXAM: PORTABLE CHEST 1 VIEW COMPARISON:  April 28, 2014 FINDINGS: There is a prominent skin fold over the left apex. No pneumothorax. Surgical hardware seen in the thoracolumbar spine. The heart size is borderline. The hila and mediastinum are normal. No nodules or masses. No focal infiltrates. No overt edema. IMPRESSION: No acute abnormalities identified. Electronically Signed   By: Dorise Bullion III M.D   On: 06/17/2017 17:54

## 2017-06-18 NOTE — Telephone Encounter (Signed)
Pt called c/o stomach pains. Pt has been unable to eat for about a week. She went to the ER yesterday and they gave her some medication but nothing is helping. Please advise, thank you!  Call pt @ [phone]

## 2017-06-19 LAB — URINE CULTURE

## 2017-06-25 ENCOUNTER — Ambulatory Visit: Payer: Medicare PPO | Admitting: Gastroenterology

## 2017-06-25 ENCOUNTER — Emergency Department
Admission: EM | Admit: 2017-06-25 | Discharge: 2017-06-25 | Disposition: A | Discharge status: Home / Self Care | Payer: Medicare PPO | Attending: Student in an Organized Health Care Education/Training Program | Admitting: Student in an Organized Health Care Education/Training Program

## 2017-06-25 ENCOUNTER — Encounter: Payer: Self-pay | Admitting: Emergency Medicine

## 2017-06-25 DIAGNOSIS — G894 Chronic pain syndrome: Secondary | ICD-10-CM | POA: Diagnosis not present

## 2017-06-25 DIAGNOSIS — F17210 Nicotine dependence, cigarettes, uncomplicated: Secondary | ICD-10-CM | POA: Insufficient documentation

## 2017-06-25 DIAGNOSIS — F1120 Opioid dependence, uncomplicated: Secondary | ICD-10-CM | POA: Insufficient documentation

## 2017-06-25 DIAGNOSIS — Z79899 Other long term (current) drug therapy: Secondary | ICD-10-CM | POA: Insufficient documentation

## 2017-06-25 DIAGNOSIS — R109 Unspecified abdominal pain: Secondary | ICD-10-CM | POA: Diagnosis present

## 2017-06-25 DIAGNOSIS — K5900 Constipation, unspecified: Secondary | ICD-10-CM | POA: Diagnosis not present

## 2017-06-25 LAB — COMPREHENSIVE METABOLIC PANEL
ALBUMIN: 4.2 g/dL (ref 3.5–5.0)
ALT: 17 U/L (ref 14–54)
ANION GAP: 12 (ref 5–15)
AST: 30 U/L (ref 15–41)
Alkaline Phosphatase: 55 U/L (ref 38–126)
BILIRUBIN TOTAL: 0.8 mg/dL (ref 0.3–1.2)
BUN: 11 mg/dL (ref 6–20)
CALCIUM: 9.7 mg/dL (ref 8.9–10.3)
CO2: 28 mmol/L (ref 22–32)
CREATININE: 0.69 mg/dL (ref 0.44–1.00)
Chloride: 101 mmol/L (ref 101–111)
GFR calc non Af Amer: >60 mL/min (ref >60)
Glucose, Bld: 98 mg/dL (ref 65–99)
Potassium: 4 mmol/L (ref 3.5–5.1)
SODIUM: 141 mmol/L (ref 135–145)
TOTAL PROTEIN: 7.2 g/dL (ref 6.5–8.1)

## 2017-06-25 LAB — TROPONIN I

## 2017-06-25 LAB — CBC
HCT: 44.9 % (ref 35.0–47.0)
HEMOGLOBIN: 15.3 g/dL (ref 12.0–16.0)
MCH: 32.3 pg (ref 26.0–34.0)
MCHC: 34.1 g/dL (ref 32.0–36.0)
MCV: 94.7 fL (ref 80.0–100.0)
PLATELETS: 189 10*3/uL (ref 150–440)
RBC: 4.74 MIL/uL (ref 3.80–5.20)
RDW: 14 % (ref 11.5–14.5)
WBC: 7.1 10*3/uL (ref 3.6–11.0)

## 2017-06-25 LAB — LIPASE, BLOOD: Lipase: 36 U/L (ref 11–51)

## 2017-06-25 MED ORDER — LORAZEPAM 0.5 MG PO TABS
0.5000 mg | ORAL_TABLET | Freq: Once | ORAL | Status: AC
  Administered 2017-06-25: 0.5 mg via ORAL
  Filled 2017-06-25: qty 1

## 2017-06-25 MED ORDER — DOCUSATE SODIUM 100 MG PO CAPS: 100.0000 mg | ORAL_CAPSULE | Freq: Once | ORAL | Status: DC

## 2017-06-25 MED ORDER — POLYETHYLENE GLYCOL 3350 17 G PO PACK: 17.0000 g | PACK | Freq: Every day | ORAL | 0 refills | Status: DC

## 2017-06-25 MED ORDER — MAGNESIUM CITRATE PO SOLN
1.0000 | Freq: Once | ORAL | Status: AC
  Administered 2017-06-25: 1 via ORAL
  Filled 2017-06-25: qty 296

## 2017-06-25 NOTE — ED Notes (Signed)
Patient came to the desk in a wheelchair pushed by her husband and was tearful, stated that she couldn't stand to wait and that she couldn't hear her name being called. Patient denies an increase in pain, but states she is in pain. Patient was asked to go to the seats to wait and we will call her.

## 2017-06-25 NOTE — ED Notes (Signed)
Per EDP, no impaction in place.  This RN present during exam.  Pt tolerated well.

## 2017-06-25 NOTE — ED Notes (Signed)
Per EDP, do not give enema at this time until EDP evaluates patient.

## 2017-06-25 NOTE — ED Notes (Signed)
Pt discharged to home.  Family member driving.  Discharge instructions reviewed.  Verbalized understanding.  No questions or concerns at this time.  Teach back verified.  Pt in NAD.  No items left in ED.   

## 2017-06-25 NOTE — ED Provider Notes (Signed)
[facility] Emergency Department Provider Note    None    (approximate)  I have reviewed the triage vital signs and the nursing notes.   HISTORY  Chief Complaint Abdominal Pain    HPI MELANNY WIRE is a 76 y.o. female resents with 2 weeks of persistent abdominal pain and decreased sleep. Patient very anxious appearing and tearful. Patient was seen in the ER on Thursday for similar symptoms and had a reassuring workup with normal CT and abdomen. Patient without any fevers. States that she's lying in bed from 9:00 until 5 AM and not sleeping. Denies any SI or HI. States that she is very frustrated that she's not having normal bowel movements. Patient has been on chronic Roxicodone for history of chronic low back and left hip pain. Has not been on any sort of stool softeners. She tried [place]  once without improvement so she came to the ER today and evaluated.   Past Medical History:  Diagnosis Date  . Anxiety   . Aortic aneurysm ([place]) 06/06/2016  . Cataract   . Chest pain 08/18/2015  . Decreased muscle strength 02/10/2014  . Depression   . Difficulty in walking(719.7) 02/10/2014  . Dysphagia 08/19/2015  . Dyspnea 08/19/2015  . Hyperparathyroidism, primary ([place])    s/p parathyroidectomy  . Hyperventilation 08/18/2015  . Insomnia   . Intractable back pain 04/29/2014  . Kidney stone   . Lumbar spinal stenosis    s/p laminectomy  . Odynophagia 08/18/2015  . Osteoarthritis of both knees    s/p knee replacements  . Pain   . Rectal prolapse    s/p repair  . Unintentional weight loss 09/08/2015  . UTI (lower urinary tract infection) 04/28/2014   Family History  Problem Relation Age of Onset  . Mental illness Mother   . COPD Father   . Heart failure Father   . Diabetes Son    Past Surgical History:  Procedure Laterality Date  . ABDOMINAL HYSTERECTOMY    . APPENDECTOMY    . BACK SURGERY  07-2015   major surgery  . BREAST LUMPECTOMY     Reports this  is benign  . colonoscopy  2013  . KNEE SURGERY Bilateral 1990's nd 2002   Knee Replacements  . PARATHYROIDECTOMY  1990's  . SPINE SURGERY    . TONSILLECTOMY     Patient Active Problem List   Diagnosis Date Noted  . 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 Osteoarthritis of ankle or foot 05/25/2017  . Trochanteric bursitis of left hip 05/25/2017  . Osteoarthritis of hip (Left) 04/25/2017  . Acute postoperative pain 02/14/2017  . Musculoskeletal pain 01/23/2017  . Tremor 01/10/2017  . Chronic pain syndrome 11/13/2016  . Chronic upper back pain (midline) 09/13/2016  . Lumbar spondylosis 08/15/2016  . Lumbar facet syndrome (Bilateral) (R>L) 07/19/2016  . Chronic groin pain (Location of Tertiary source of pain) (Left) 07/13/2016  . 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 Opioid-induced constipation (OIC) 07/12/2016  . Osteoarthritis, multiple sites 07/11/2016  . Chronic low back pain (Location of Primary Source of Pain) (Bilateral) (R>L) 07/11/2016  . 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 Chronic knee pain (Bilateral) (L>R) 07/11/2016  . Long term current use of opiate analgesic 07/11/2016  . Long term prescription opiate use 07/11/2016  . Opiate use (60 MME/Day) 07/11/2016  . Encounter for pain management planning 07/11/2016  . Encounter for therapeutic drug level monitoring 07/11/2016  . Abnormal CT  scan, lumbar spine (05/07/2015) 07/11/2016  . Osteoarthritis of sacroiliac joint (Bilateral) 07/11/2016  . Lumbar facet arthropathy ([place]) 07/11/2016  . Lumbar spondylolisthesis 07/11/2016  . Lumbar foraminal stenosis (Bilateral) 07/11/2016  . History of total bilateral knee replacement 07/11/2016  . Aortic aneurysm ([place]) 06/06/2016  . Hyperlipidemia 03/06/2016  . Chronic hip pain (Location of Secondary source of pain) (Left) 12/06/2015  . Solitary pulmonary nodule 09/10/2015  . Mechanical complication of internal fixation device such as  nail, plate or rod ([place]) 40/06/6760  . 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 Pseudarthrosis following spinal fusion 07/29/2015  . Generalized weakness 06/02/2014  . History of lumbar fusion (T10 through L3 fusion) 04/28/2014  . Obesity (BMI 30-39.9) 06/23/2013  . Anxiety and depression   . Lumbar spinal stenosis       Prior to Admission medications   Medication Sig Start Date End Date Taking? Authorizing Provider  Ascorbic Acid (VITAMIN C) 500 MG CAPS Take by mouth daily.    [provider]  cyclobenzaprine (FLEXERIL) 10 MG tablet Take 1 tablet (10 mg total) by mouth at bedtime. Patient taking differently: Take 10 mg by mouth at bedtime as needed.  04/24/17 08/22/17  Vevelyn Francois, NP  DULoxetine (CYMBALTA) 60 MG capsule TAKE 1 CAPSULE EVERY DAY 02/08/17   [name], MD  famotidine (PEPCID) 20 MG tablet Take 1 tablet (20 mg total) by mouth 2 (two) times daily. 06/18/17 06/18/18  Darel Hong, MD  fish oil-omega-3 fatty acids 1000 MG capsule Take 1 g by mouth daily.     [provider]  gabapentin (NEURONTIN) 300 MG capsule Take 300 mg (one tablet) by mouth in the morning and take 600 mg (2 tablets) by mouth at night Patient taking differently: Take 600 mg by mouth at bedtime as needed. Take 300 mg (one tablet) by mouth in the morning and take 600 mg (2 tablets) by mouth at night 02/13/17   [name], MD  HYDROcodone-acetaminophen (NORCO/VICODIN) 5-325 MG tablet Take 1 tablet by mouth 3 (three) times daily as needed. 02/15/17   [provider]  lubiprostone (AMITIZA) 8 MCG capsule Take 1 capsule (8 mcg total) by mouth 2 (two) times daily with a meal. Swallow the medication whole. Do not break or chew the medication. 07/19/16   Milinda Pointer, MD  Melatonin 5 MG TABS Take 1 tablet by mouth at bedtime as needed.     [provider]  Misc Natural Products (OSTEO BI-FLEX ADV TRIPLE ST PO) Take 1 tablet by mouth daily.      [provider]  Multiple Vitamins-Minerals (WOMENS MULTI) CAPS Take one tablet once daily    [provider]  naloxone [facility]) 2 MG/2ML injection Inject content of syringe into thigh muscle. Call 911. 07/19/16   Milinda Pointer, MD  ondansetron (ZOFRAN ODT) 4 MG disintegrating tablet Take 1 tablet (4 mg total) by mouth every 8 (eight) hours as needed for nausea. 06/17/17   Noemi Chapel, MD  Oxycodone HCl 10 MG TABS Take 1 tablet (10 mg total) by mouth every 6 (six) hours as needed. 08/06/17 09/05/17  Vevelyn Francois, NP  polyethylene glycol (MIRALAX / GLYCOLAX) packet Take 17 g by mouth daily. Mix one tablespoon with 8oz of your favorite juice or water every day until you are having soft formed stools. Then start taking once daily if you didn't have a stool the day before. 06/25/17   Merlyn Lot, MD  primidone (MYSOLINE) 50 MG tablet Take by  mouth 25mg  (1/2 tablet) nightly for two weeks then 25mg  (1/2 tablet) two times a day. 01/11/17   [provider]  Red Yeast Rice 600 MG CAPS Take 2 capsules (1,200 mg total) by mouth daily. 06/06/16   [name], MD  vitamin B-12 (CYANOCOBALAMIN) 1000 MCG tablet Take 1,000 mcg by mouth every other day.  07/20/15   [provider]  Wheat Dextrin (BENEFIBER) POWD Stir 2 tsp. TID into 4-8 oz of any non-carbonated beverage or soft food (hot or cold) 07/19/16   Milinda Pointer, MD    Allergies Fentanyl; Nsaids; and Pentothal [thiopental]    Social History Social History  Substance Use Topics  . Smoking status: Current Every Day Smoker    Packs/day: 0.75    Years: 50.00    Types: Cigarettes  . Smokeless tobacco: Never Used  . Alcohol use No    Review of Systems Patient denies headaches, rhinorrhea, blurry vision, numbness, shortness of breath, chest pain, edema, cough, abdominal pain, nausea, vomiting, diarrhea, dysuria, fevers, rashes or hallucinations unless otherwise stated above in  HPI. ____________________________________________   PHYSICAL EXAM:  VITAL SIGNS: Vitals:   06/25/17 1623 06/25/17 1808  BP:  (!) 150/80  Pulse: 68 68  Resp: (!) 22 18  Temp:    SpO2: 99% 99%    Constitutional: Alert and oriented. tearful but in no acute distress. Eyes: Conjunctivae are normal.  Head: Atraumatic. Nose: No congestion/rhinnorhea. Mouth/Throat: Mucous membranes are moist.   Neck: No stridor. Painless ROM.  Cardiovascular: Normal rate, regular rhythm. Grossly normal heart sounds.  Good peripheral circulation. Respiratory: Normal respiratory effort.  No retractions. Lungs CTAB. Gastrointestinal: Soft and nontender. No distention. No abdominal bruits. No CVA tenderness. Genitourinary: soft formed stool in rectal vault, no masses or impaction Musculoskeletal: No lower extremity tenderness nor edema.  No joint effusions. Neurologic:  Normal speech and language. No gross focal neurologic deficits are appreciated. No facial droop Skin:  Skin is warm, dry and intact. No rash noted. Psychiatric: Mood and affect are normal. Speech and behavior are normal.  ____________________________________________   LABS (all labs ordered are listed, but only abnormal results are displayed)  Results for orders placed or performed during the hospital encounter of 06/25/17 (from the past 24 hour(s))  Lipase, blood     Status: None   Collection Time: 06/25/17  1:49 PM  Result Value Ref Range   Lipase 36 11 - 51 U/L  Comprehensive metabolic panel     Status: None   Collection Time: 06/25/17  1:49 PM  Result Value Ref Range   Sodium 141 135 - 145 mmol/L   Potassium 4.0 3.5 - 5.1 mmol/L   Chloride 101 101 - 111 mmol/L   CO2 28 22 - 32 mmol/L   Glucose, Bld 98 65 - 99 mg/dL   BUN 11 6 - 20 mg/dL   Creatinine, Ser 0.69 0.44 - 1.00 mg/dL   Calcium 9.7 8.9 - 10.3 mg/dL   Total Protein 7.2 6.5 - 8.1 g/dL   Albumin 4.2 3.5 - 5.0 g/dL   AST 30 15 - 41 U/L   ALT 17 14 - 54 U/L    Alkaline Phosphatase 55 38 - 126 U/L   Total Bilirubin 0.8 0.3 - 1.2 mg/dL   GFR calc non Af Amer >60 >60 mL/min   GFR calc Af Amer >60 >60 mL/min   Anion gap 12 5 - 15  CBC     Status: None   Collection Time: 06/25/17  1:49  PM  Result Value Ref Range   WBC 7.1 3.6 - 11.0 K/uL   RBC 4.74 3.80 - 5.20 MIL/uL   Hemoglobin 15.3 12.0 - 16.0 g/dL   HCT 44.9 35.0 - 47.0 %   MCV 94.7 80.0 - 100.0 fL   MCH 32.3 26.0 - 34.0 pg   MCHC 34.1 32.0 - 36.0 g/dL   RDW 14.0 11.5 - 14.5 %   Platelets 189 150 - 440 K/uL  Troponin I     Status: None   Collection Time: 06/25/17  1:49 PM  Result Value Ref Range   Troponin I <0.03 <0.03 ng/mL   ____________________________________________  EKG My review and personal interpretation at Time: 17:56   Indication: abd pain  Rate: 60  Rhythm: sinus Axis: normal Other: nonspecific st changes, no stemi, normal intervals,  ____________________________________________  RADIOLOGY   ____________________________________________   PROCEDURES  Procedure(s) performed:  Procedures    Critical Care performed: no ____________________________________________   INITIAL IMPRESSION / ASSESSMENT AND PLAN / ED COURSE  Pertinent labs & imaging results that were available during my care of the patient were reviewed by me and considered in my medical decision making (see chart for details).  DDX: constipation, medication, effect, enteritis, colitis,   LINDY GARCZYNSKI is a 76 y.o. who presents to the ED with 2 weeks of abdominal discomfort as described above. Patient very anxious appearing but has a benign abdominal exam. Blood work is reassuring. Based on recent normal CT scan I do not feel that she needs repeat CT imaging at this time. She has no evidence of stool impaction. No evidence of ACS or other acute emergent process. I do suspect a lot of her symptoms are secondary to chronic opioid abuse without any stool softeners. I do feel that she is stable and  appropriate for outpatient follow-up.      ____________________________________________   FINAL CLINICAL IMPRESSION(S) / ED DIAGNOSES  Final diagnoses:  Constipation, unspecified constipation type  Narcotic dependency, continuous ([place])      NEW MEDICATIONS STARTED DURING THIS VISIT:  Discharge Medication List as of 06/25/2017  5:47 PM    START taking these medications   Details  polyethylene glycol (MIRALAX / GLYCOLAX) packet Take 17 g by mouth daily. Mix one tablespoon with 8oz of your favorite juice or water every day until you are having soft formed stools. Then start taking once daily if you didn't have a stool the day before., Starting Mon 06/25/2017, Print         Note:  This document was prepared using Dragon voice recognition software and may include unintentional dictation errors.    Merlyn Lot, MD 06/25/17 Tresa Moore

## 2017-06-25 NOTE — ED Triage Notes (Signed)
States has had abdominal pain x 2 weeks. States has had trouble having bowel movements and decreased appetite. States MD gave amitiza without relief.

## 2017-06-25 NOTE — Discharge Instructions (Signed)

## 2017-06-25 NOTE — ED Notes (Signed)
Patient's husband came up to the desk and asked how long the wait was. Husband was assured we would place the patient in a bed as soon as possible. No change in patient's condition.

## 2017-06-29 ENCOUNTER — Telehealth: Payer: Self-pay | Admitting: Family Medicine

## 2017-06-29 NOTE — Telephone Encounter (Signed)
Pt called and stated that she has been having GI issues, and has been to the ED and has seen Herb Grays. She is not sleeping and was advised to call Dr. Caryl Bis to see if he could prescribe something for sleep. Please advise, thank you!  Pharmacy - [facility], [address]  Call @ [phone]

## 2017-06-29 NOTE — Telephone Encounter (Signed)
Please advise 

## 2017-06-29 NOTE — Telephone Encounter (Signed)
I would suggest having the patient come in the office for an appointment to consider treatment for this.

## 2017-07-02 NOTE — Telephone Encounter (Signed)
Unfortunately I will be be unable to provide a prescription medication for sleep until she is seen in the office to discuss this further. She could try over-the-counter melatonin 0.5 mg 30 minutes prior to bedtime to see if that will be beneficial. Thanks.

## 2017-07-02 NOTE — Telephone Encounter (Signed)
Please triage

## 2017-07-02 NOTE — Telephone Encounter (Signed)
Pt requested a call to discuss her GI issues. Pt stated that she could not come to office  Pt contact [phone]

## 2017-07-02 NOTE — Telephone Encounter (Signed)
Patient said she doesn't need to be triaged. She has been in and out of the ED since 06/18/2017 with GI symptoms. There is a report from GI in [place]. Patient wants something to help her sleep but states it is impossible for her to come in and be seen with her being so weak. Please advise.

## 2017-07-02 NOTE — Telephone Encounter (Signed)
Left message to return call 

## 2017-07-03 ENCOUNTER — Other Ambulatory Visit
Admission: RE | Admit: 2017-07-03 | Discharge: 2017-07-03 | Disposition: A | Discharge status: Home / Self Care | Payer: Medicare PPO | Source: Ambulatory Visit | Attending: Student | Admitting: Student

## 2017-07-03 DIAGNOSIS — R194 Change in bowel habit: Secondary | ICD-10-CM | POA: Diagnosis present

## 2017-07-03 LAB — GASTROINTESTINAL PANEL BY PCR, STOOL (REPLACES STOOL CULTURE)
ADENOVIRUS F40/41: NOT DETECTED
Astrovirus: NOT DETECTED
CAMPYLOBACTER SPECIES: NOT DETECTED
CRYPTOSPORIDIUM: NOT DETECTED
Cyclospora cayetanensis: NOT DETECTED
ENTEROPATHOGENIC E COLI (EPEC): NOT DETECTED
Entamoeba histolytica: NOT DETECTED
Enteroaggregative E coli (EAEC): NOT DETECTED
Enterotoxigenic E coli (ETEC): NOT DETECTED
Giardia lamblia: NOT DETECTED
NOROVIRUS GI/GII: NOT DETECTED
PLESIMONAS SHIGELLOIDES: NOT DETECTED
ROTAVIRUS A: NOT DETECTED
SALMONELLA SPECIES: NOT DETECTED
SHIGELLA/ENTEROINVASIVE E COLI (EIEC): NOT DETECTED
Sapovirus (I, II, IV, and V): NOT DETECTED
Shiga like toxin producing E coli (STEC): NOT DETECTED
Vibrio cholerae: NOT DETECTED
Vibrio species: NOT DETECTED
YERSINIA ENTEROCOLITICA: NOT DETECTED

## 2017-07-03 NOTE — Telephone Encounter (Signed)
Patient refused to come in for appointment and says that melatonin is not effective for her. She says she will make other arrangements.

## 2017-07-13 LAB — CALPROTECTIN, FECAL: CALPROTECTIN, FECAL: 54 ug/g (ref 0–120)

## 2017-07-25 ENCOUNTER — Telehealth: Payer: Self-pay | Admitting: Pain Medicine

## 2017-07-25 ENCOUNTER — Other Ambulatory Visit
Admission: RE | Admit: 2017-07-25 | Discharge: 2017-07-25 | Disposition: A | Discharge status: Home / Self Care | Payer: Medicare PPO | Source: Ambulatory Visit | Attending: Student | Admitting: Student

## 2017-07-25 DIAGNOSIS — R197 Diarrhea, unspecified: Secondary | ICD-10-CM | POA: Insufficient documentation

## 2017-07-25 LAB — C DIFFICILE QUICK SCREEN W PCR REFLEX
C DIFFICILE (CDIFF) TOXIN: NEGATIVE
C DIFFICLE (CDIFF) ANTIGEN: NEGATIVE
C Diff interpretation: NOT DETECTED

## 2017-07-25 NOTE — Telephone Encounter (Signed)
Still having a lot of back pain, is there anything Dr. Dossie Arbour can do to help

## 2017-07-25 NOTE — Telephone Encounter (Signed)
Attempted to call patient.  No answer.  No answering machine.

## 2017-07-26 NOTE — Telephone Encounter (Signed)
Pain is in the lower back, does not radiate. Please advise and order which procedure is needed.

## 2017-07-30 NOTE — Telephone Encounter (Signed)
Spoke with patient, she states both sides of her back are hurting but right is worse.  She is s/p lumbar facet RF on the Left on 04/16/2017 and 02/14/17 which stated bilateral.  Bilateral lumbar facets on 12/11/16.

## 2017-07-30 NOTE — Telephone Encounter (Signed)
Attempted to call patient to verify which side for lumbar facet,  No answer.

## 2017-07-31 NOTE — Telephone Encounter (Signed)
Please get PA and schedule.

## 2017-08-02 ENCOUNTER — Telehealth: Payer: Self-pay

## 2017-08-02 LAB — PANCREATIC ELASTASE, FECAL: Pancreatic Elastase-1, Stool: >500 ug Elast./g (ref >200)

## 2017-08-02 NOTE — Telephone Encounter (Signed)
Order is in the computer. Please schedule and call patient. Thank you- Anderson Malta

## 2017-08-02 NOTE — Telephone Encounter (Signed)
Patient would like to get a hip injection while she is waiting for prior auth for lumbar facets. If this is ok please put an order in. Thanks

## 2017-08-09 ENCOUNTER — Ambulatory Visit
Admission: RE | Admit: 2017-08-09 | Discharge: 2017-08-09 | Disposition: A | Discharge status: Home / Self Care | Payer: Medicare PPO | Source: Ambulatory Visit | Attending: Pain Medicine | Admitting: Pain Medicine

## 2017-08-09 ENCOUNTER — Encounter: Payer: Self-pay | Admitting: Pain Medicine

## 2017-08-09 ENCOUNTER — Ambulatory Visit (HOSPITAL_BASED_OUTPATIENT_CLINIC_OR_DEPARTMENT_OTHER): Payer: Medicare PPO | Admitting: Pain Medicine

## 2017-08-09 VITALS — BP 101/81 | HR 76 | Temp 97.8°F | Resp 10 | Ht <= 58 in | Wt 119.0 lb

## 2017-08-09 DIAGNOSIS — M25552 Pain in left hip: Secondary | ICD-10-CM | POA: Diagnosis not present

## 2017-08-09 DIAGNOSIS — G894 Chronic pain syndrome: Secondary | ICD-10-CM

## 2017-08-09 DIAGNOSIS — G8929 Other chronic pain: Secondary | ICD-10-CM

## 2017-08-09 DIAGNOSIS — M1612 Unilateral primary osteoarthritis, left hip: Secondary | ICD-10-CM

## 2017-08-09 MED ORDER — LIDOCAINE HCL 2 % IJ SOLN
INTRAMUSCULAR | Status: AC
  Filled 2017-08-09: qty 20

## 2017-08-09 MED ORDER — METHYLPREDNISOLONE ACETATE 80 MG/ML IJ SUSP
80.0000 mg | Freq: Once | INTRAMUSCULAR | Status: AC
  Administered 2017-08-09: 80 mg via INTRA_ARTICULAR

## 2017-08-09 MED ORDER — MIDAZOLAM HCL 5 MG/5ML IJ SOLN
INTRAMUSCULAR | Status: AC
  Filled 2017-08-09: qty 5

## 2017-08-09 MED ORDER — FENTANYL CITRATE (PF) 100 MCG/2ML IJ SOLN
INTRAMUSCULAR | Status: AC
  Filled 2017-08-09: qty 2

## 2017-08-09 MED ORDER — ROPIVACAINE HCL 2 MG/ML IJ SOLN
INTRAMUSCULAR | Status: AC
  Filled 2017-08-09: qty 10

## 2017-08-09 MED ORDER — IOPAMIDOL (ISOVUE-M 200) INJECTION 41%
INTRAMUSCULAR | Status: AC
  Filled 2017-08-09: qty 10

## 2017-08-09 MED ORDER — IOPAMIDOL (ISOVUE-M 200) INJECTION 41%
10.0000 mL | Freq: Once | INTRAMUSCULAR | Status: AC
  Administered 2017-08-09: 20 mL via INTRA_ARTICULAR

## 2017-08-09 MED ORDER — ROPIVACAINE HCL 2 MG/ML IJ SOLN
4.0000 mL | Freq: Once | INTRAMUSCULAR | Status: AC
  Administered 2017-08-09: 10 mL via INTRA_ARTICULAR

## 2017-08-09 MED ORDER — LIDOCAINE HCL 2 % IJ SOLN
10.0000 mL | Freq: Once | INTRAMUSCULAR | Status: AC
  Administered 2017-08-09: 400 mg

## 2017-08-09 MED ORDER — LACTATED RINGERS IV SOLN
1000.0000 mL | Freq: Once | INTRAVENOUS | Status: AC
  Administered 2017-08-09: 1000 mL via INTRAVENOUS

## 2017-08-09 MED ORDER — MIDAZOLAM HCL 5 MG/5ML IJ SOLN
1.0000 mg | INTRAMUSCULAR | Status: DC | PRN
  Administered 2017-08-09: 2 mg via INTRAVENOUS

## 2017-08-09 MED ORDER — METHYLPREDNISOLONE ACETATE 80 MG/ML IJ SUSP
INTRAMUSCULAR | Status: AC
  Filled 2017-08-09: qty 1

## 2017-08-09 MED ORDER — FENTANYL CITRATE (PF) 100 MCG/2ML IJ SOLN
25.0000 ug | INTRAMUSCULAR | Status: DC | PRN
  Administered 2017-08-09: 50 ug via INTRAVENOUS

## 2017-08-09 NOTE — Progress Notes (Signed)
Safety precautions to be maintained throughout the outpatient stay will include: orient to surroundings, keep bed in low position, maintain call bell within reach at all times, provide assistance with transfer out of bed and ambulation.  

## 2017-08-09 NOTE — Progress Notes (Signed)
Patient's Name: Brittany Mays  MRN: [id number]  Referring Provider: [name], MD  DOB: Jul 26, 1941  PCP: [name], MD  DOS: 08/09/2017  Note by: Gaspar Cola, MD  Service setting: Ambulatory outpatient  Specialty: Interventional Pain Management  Patient type: Established  Location: [facility] (AMB) Pain Management Facility  Visit type: Interventional Procedure   Primary Reason for Visit: Interventional Pain Management Treatment. CC: Hip Pain (left)  Procedure:  Anesthesia, Analgesia, Anxiolysis:  Type: Diagnostic Intra-Articular Hip Injection Region:  Posterolateral hip joint area. Level: Lower pelvic and hip joint level. Laterality: Left-Sided  Type: Local Anesthesia with Moderate (Conscious) Sedation Local Anesthetic: Lidocaine 1% Route: Intravenous (IV) IV Access: Secured Sedation: Meaningful verbal contact was maintained at all times during the procedure  Indication(s): Analgesia and Anxiety   Indications: 1. Chronic hip pain (Location of Secondary source of pain) (Left)   2. Osteoarthritis of hip (Left)   3. Chronic pain syndrome    Pain Score: Pre-procedure: 5 /10 Post-procedure: 1 /10  Pre-op Assessment:  Brittany Mays is a 77 y.o. (year old), female patient, seen today for interventional treatment. She  has a past surgical history that includes Abdominal hysterectomy; Knee surgery (Bilateral, 1990's nd 2002); Spine surgery; Parathyroidectomy (4098'J); colonoscopy (2013); Appendectomy; Breast lumpectomy; Tonsillectomy; and Back surgery (07-2015). Brittany Mays has a current medication list which includes the following prescription(s): duloxetine, naloxone, oxycodone hcl, and polyethylene glycol, and the following Facility-Administered Medications: fentanyl, lactated ringers, and midazolam. Her primarily concern today is the Hip Pain (left)  Initial Vital Signs: There were no vitals taken for this visit. BMI: Estimated body mass index is 24.87 kg/m as calculated  from the following:   Height as of this encounter: 4\' 10"  (1.473 m).   Weight as of this encounter: 119 lb (54 kg).  Risk Assessment: Allergies: Reviewed. She is allergic to fentanyl; nsaids; and pentothal [thiopental].  Allergy Precautions: None required Coagulopathies: Reviewed. None identified.  Blood-thinner therapy: None at this time Active Infection(s): Reviewed. None identified. Brittany Mays is afebrile  Site Confirmation: Brittany Mays was asked to confirm the procedure and laterality before marking the site Procedure checklist: Completed Consent: Before the procedure and under the influence of no sedative(s), amnesic(s), or anxiolytics, the patient was informed of the treatment options, risks and possible complications. To fulfill our ethical and legal obligations, as recommended by the American Medical Association's Code of Ethics, I have informed the patient of my clinical impression; the nature and purpose of the treatment or procedure; the risks, benefits, and possible complications of the intervention; the alternatives, including doing nothing; the risk(s) and benefit(s) of the alternative treatment(s) or procedure(s); and the risk(s) and benefit(s) of doing nothing. The patient was provided information about the general risks and possible complications associated with the procedure. These may include, but are not limited to: failure to achieve desired goals, infection, bleeding, organ or nerve damage, allergic reactions, paralysis, and death. In addition, the patient was informed of those risks and complications associated to the procedure, such as failure to decrease pain; infection; bleeding; organ or nerve damage with subsequent damage to sensory, motor, and/or autonomic systems, resulting in permanent pain, numbness, and/or weakness of one or several areas of the body; allergic reactions; (i.e.: anaphylactic reaction); and/or death. Furthermore, the patient was informed of those risks  and complications associated with the medications. These include, but are not limited to: allergic reactions (i.e.: anaphylactic or anaphylactoid reaction(s)); adrenal axis suppression; blood sugar elevation that in diabetics may result  in ketoacidosis or comma; water retention that in patients with history of congestive heart failure may result in shortness of breath, pulmonary edema, and decompensation with resultant heart failure; weight gain; swelling or edema; medication-induced neural toxicity; particulate matter embolism and blood vessel occlusion with resultant organ, and/or nervous system infarction; and/or aseptic necrosis of one or more joints. Finally, the patient was informed that Medicine is not an exact science; therefore, there is also the possibility of unforeseen or unpredictable risks and/or possible complications that may result in a catastrophic outcome. The patient indicated having understood very clearly. We have given the patient no guarantees and we have made no promises. Enough time was given to the patient to ask questions, all of which were answered to the patient's satisfaction. Brittany Mays has indicated that she wanted to continue with the procedure. Attestation: I, the ordering provider, attest that I have discussed with the patient the benefits, risks, side-effects, alternatives, likelihood of achieving goals, and potential problems during recovery for the procedure that I have provided informed consent. Date: 08/09/2017; Time: 7:34 AM  Pre-Procedure Preparation:  Monitoring: As per clinic protocol. Respiration, ETCO2, SpO2, BP, heart rate and rhythm monitor placed and checked for adequate function Safety Precautions: Patient was assessed for positional comfort and pressure points before starting the procedure. Time-out: I initiated and conducted the "Time-out" before starting the procedure, as per protocol. The patient was asked to participate by confirming the accuracy of the  "Time Out" information. Verification of the correct person, site, and procedure were performed and confirmed by me, the nursing staff, and the patient. "Time-out" conducted as per Joint Commission's Universal Protocol (UP.01.01.01). "Time-out" Date & Time: 08/09/2017; 1200 hrs.  Description of Procedure Process:   Position: Lateral Decubitus with bad side up Target Area: Superior aspect of the hip joint cavity, going thru the superior portion of the capsular ligament. Approach: Posterolateral approach. Area Prepped: Entire Posterolateral hip area. Prepping solution: ChloraPrep (2% chlorhexidine gluconate and 70% isopropyl alcohol) Safety Precautions: Aspiration looking for blood return was conducted prior to all injections. At no point did we inject any substances, as a needle was being advanced. No attempts were made at seeking any paresthesias. Safe injection practices and needle disposal techniques used. Medications properly checked for expiration dates. SDV (single dose vial) medications used. Description of the Procedure: Protocol guidelines were followed. The patient was placed in position over the fluoroscopy table. The target area was identified and the area prepped in the usual manner. Skin & deeper tissues infiltrated with local anesthetic. Appropriate amount of time allowed to pass for local anesthetics to take effect. The procedure needles were then advanced to the target area. Proper needle placement secured. Negative aspiration confirmed. Solution injected in intermittent fashion, asking for systemic symptoms every 0.5cc of injectate. The needles were then removed and the area cleansed, making sure to leave some of the prepping solution back to take advantage of its long term bactericidal properties. Vitals:   08/09/17 1214 08/09/17 1223 08/09/17 1233 08/09/17 1243  BP: (!) 89/64 105/64 101/65 101/81  Pulse: 76     Resp: (!) 27 15 12 10   Temp:  97.8 F (36.6 C)    TempSrc:  Tympanic     SpO2: 100% 95% 93% 97%  Weight:      Height:        Start Time: 1200 hrs. End Time: 1213 hrs. Materials:  Needle(s) Type: Regular needle Gauge: 22G Length: 5-in Medication(s): We administered lactated ringers, midazolam, fentaNYL, lidocaine, iopamidol,  methylPREDNISolone acetate, and ropivacaine (PF) 2 mg/mL (0.2%). Please see chart orders for dosing details.  Imaging Guidance (Non-Spinal):  Type of Imaging Technique: Fluoroscopy Guidance (Non-Spinal) Indication(s): Assistance in needle guidance and placement for procedures requiring needle placement in or near specific anatomical locations not easily accessible without such assistance. Exposure Time: Please see nurses notes. Contrast: Before injecting any contrast, we confirmed that the patient did not have an allergy to iodine, shellfish, or radiological contrast. Once satisfactory needle placement was completed at the desired level, radiological contrast was injected. Contrast injected under live fluoroscopy. No contrast complications. See chart for type and volume of contrast used. Fluoroscopic Guidance: I was personally present during the use of fluoroscopy. "Tunnel Vision Technique" used to obtain the best possible view of the target area. Parallax error corrected before commencing the procedure. "Direction-depth-direction" technique used to introduce the needle under continuous pulsed fluoroscopy. Once target was reached, antero-posterior, oblique, and lateral fluoroscopic projection used confirm needle placement in all planes. Images permanently stored in EMR. Interpretation: I personally interpreted the imaging intraoperatively. Adequate needle placement confirmed in multiple planes. Appropriate spread of contrast into desired area was observed. No evidence of afferent or efferent intravascular uptake. Permanent images saved into the patient's record.  Antibiotic Prophylaxis:  Indication(s): None identified Antibiotic given:  None  Post-operative Assessment:  EBL: None Complications: No immediate post-treatment complications observed by team, or reported by patient. Note: The patient tolerated the entire procedure well. A repeat set of vitals were taken after the procedure and the patient was kept under observation following institutional policy, for this type of procedure. Post-procedural neurological assessment was performed, showing return to baseline, prior to discharge. The patient was provided with post-procedure discharge instructions, including a section on how to identify potential problems. Should any problems arise concerning this procedure, the patient was given instructions to immediately contact us, at any time, without hesitation. In any case, we plan to contact the patient by telephone for a follow-up status report regarding this interventional procedure. Comments:  No additional relevant information.  Plan of Care    Imaging Orders     DG C-Arm 1-60 Min-No Report  Procedure Orders     HIP INJECTION  Medications ordered for procedure: Meds ordered this encounter  Medications  . lactated ringers infusion 1,000 mL  . midazolam (VERSED) 5 MG/5ML injection 1-2 mg    Make sure Flumazenil is available in the pyxis when using this medication. If oversedation occurs, administer 0.2 mg IV over 15 sec. If after 45 sec no response, administer 0.2 mg again over 1 min; may repeat at 1 min intervals; not to exceed 4 doses (1 mg)  . fentaNYL (SUBLIMAZE) injection 25-50 mcg    Make sure Narcan is available in the pyxis when using this medication. In the event of respiratory depression (RR< 8/min): Titrate NARCAN (naloxone) in increments of 0.1 to 0.2 mg IV at 2-3 minute intervals, until desired degree of reversal.  . lidocaine (XYLOCAINE) 2 % (with pres) injection 200 mg  . iopamidol (ISOVUE-M) 41 % intrathecal injection 10 mL  . methylPREDNISolone acetate (DEPO-MEDROL) injection 80 mg  . ropivacaine (PF) 2  mg/mL (0.2%) (NAROPIN) injection 4 mL   Medications administered: We administered lactated ringers, midazolam, fentaNYL, lidocaine, iopamidol, methylPREDNISolone acetate, and ropivacaine (PF) 2 mg/mL (0.2%).  See the medical record for exact dosing, route, and time of administration.  New Prescriptions   No medications on file   Disposition: Discharge home  Discharge Date & Time: 08/09/2017; 1255  hrs.   Physician-requested Follow-up: Return for post-procedure eval by Dr. Dossie Arbour in 2 wks. Future Appointments Date Time Provider [place]  08/23/2017 11:00 AM Vevelyn Francois, NP [facility] None  09/11/2017 9:45 AM Caryl Bis Angela Adam, MD [facility] None   Primary Care Physician: [name], MD Location: [facility] Note by: Gaspar Cola, MD Date: 08/09/2017; Time: 1:20 PM  Disclaimer:  Medicine is not an exact science. The only guarantee in medicine is that nothing is guaranteed. It is important to note that the decision to proceed with this intervention was based on the information collected from the patient. The Data and conclusions were drawn from the patient's questionnaire, the interview, and the physical examination. Because the information was provided in large part by the patient, it cannot be guaranteed that it has not been purposely or unconsciously manipulated. Every effort has been made to obtain as much relevant data as possible for this evaluation. It is important to note that the conclusions that lead to this procedure are derived in large part from the available data. Always take into account that the treatment will also be dependent on availability of resources and existing treatment guidelines, considered by other Pain Management Practitioners as being common knowledge and practice, at the time of the intervention. For Medico-Legal purposes, it is also important to point out that variation in procedural techniques and  pharmacological choices are the acceptable norm. The indications, contraindications, technique, and results of the above procedure should only be interpreted and judged by a Board-Certified Interventional Pain Specialist with extensive familiarity and expertise in the same exact procedure and technique.

## 2017-08-09 NOTE — Patient Instructions (Signed)

## 2017-08-10 ENCOUNTER — Telehealth: Payer: Self-pay

## 2017-08-10 NOTE — Telephone Encounter (Signed)
Post procedure phone call.  Patient states she is doing OK 

## 2017-08-13 ENCOUNTER — Telehealth: Payer: Self-pay | Admitting: Pain Medicine

## 2017-08-13 NOTE — Telephone Encounter (Signed)
Patient informed that no medication changes will be made outside of appointment. She wislhes to stop Oxycodone and begin a non opiod medication. Offered a sooner appointment that the one she has on 08-23-17. Patient states she cannot come for sooner appointment. Will discuss this with Ms. Edison Pace on her already scheduled appointment.

## 2017-08-13 NOTE — Telephone Encounter (Signed)
Patient wants to discuss medication and side effects she is having, please call

## 2017-08-21 ENCOUNTER — Telehealth: Payer: Self-pay | Admitting: *Deleted

## 2017-08-21 NOTE — Telephone Encounter (Signed)
Noted  

## 2017-08-21 NOTE — Telephone Encounter (Signed)
Spoke with patient, pain in hip is worse and the last procedure did not help at all.  Patient is thinking that she is going to go to [facility] ED and is wanting to have a hip replacement.  Suggested to patient that we could evaluate here this afternoon or that maybe she should call for an appt with an orthopedic, but she wants to go to the ED at [facility].  She would like Dr Dossie Arbour to know this information in case he has any other suggestions.

## 2017-08-22 ENCOUNTER — Telehealth: Payer: Self-pay | Admitting: *Deleted

## 2017-08-22 NOTE — Telephone Encounter (Signed)
Noted  

## 2017-08-22 NOTE — Telephone Encounter (Signed)
Was made aware by Rozetta Nunnery that Mrs Headen has called numerous times today and was conveyed to me that patient thought I was going to call her back on yesterday after speaking with Dr Dossie Arbour.    Returned call to patient and let her know that I was unaware of her expectation that I would be calling her back that I understood that she was going to [facility] ED when her son returned home.  I did explain that to have Dr Dossie Arbour evaluate her progress from procedure or pain currently she would need to come in which was why I had offered her an appt on yesterday.  Again I offered for her to come for evaluation this afternoon.  Patient states she has an appt tomorrow with Crystal but is concerened that Dr Dossie Arbour would not be a part of that.  Educated patient that Crystal and Dr Dossie Arbour work together and that if he needed to see her tomorrow he would be available to do this.  Patient states she will come tomorrow and that appt is time suitable and does not want to come this afternoon.

## 2017-08-23 ENCOUNTER — Encounter: Payer: Self-pay | Admitting: Nurse Practitioner

## 2017-08-23 ENCOUNTER — Ambulatory Visit: Payer: Medicare PPO | Attending: Nurse Practitioner | Admitting: Nurse Practitioner

## 2017-08-23 VITALS — BP 137/83 | HR 88 | Temp 98.2°F | Resp 18 | Ht <= 58 in | Wt 117.0 lb

## 2017-08-23 DIAGNOSIS — Z833 Family history of diabetes mellitus: Secondary | ICD-10-CM | POA: Diagnosis not present

## 2017-08-23 DIAGNOSIS — Z90710 Acquired absence of both cervix and uterus: Secondary | ICD-10-CM | POA: Diagnosis not present

## 2017-08-23 DIAGNOSIS — F17210 Nicotine dependence, cigarettes, uncomplicated: Secondary | ICD-10-CM | POA: Diagnosis not present

## 2017-08-23 DIAGNOSIS — M25552 Pain in left hip: Secondary | ICD-10-CM

## 2017-08-23 DIAGNOSIS — Z87442 Personal history of urinary calculi: Secondary | ICD-10-CM | POA: Insufficient documentation

## 2017-08-23 DIAGNOSIS — G4700 Insomnia, unspecified: Secondary | ICD-10-CM | POA: Insufficient documentation

## 2017-08-23 DIAGNOSIS — Z885 Allergy status to narcotic agent status: Secondary | ICD-10-CM | POA: Insufficient documentation

## 2017-08-23 DIAGNOSIS — Z87440 Personal history of urinary (tract) infections: Secondary | ICD-10-CM | POA: Diagnosis not present

## 2017-08-23 DIAGNOSIS — G894 Chronic pain syndrome: Secondary | ICD-10-CM | POA: Diagnosis not present

## 2017-08-23 DIAGNOSIS — Z836 Family history of other diseases of the respiratory system: Secondary | ICD-10-CM | POA: Insufficient documentation

## 2017-08-23 DIAGNOSIS — E785 Hyperlipidemia, unspecified: Secondary | ICD-10-CM | POA: Diagnosis not present

## 2017-08-23 DIAGNOSIS — F329 Major depressive disorder, single episode, unspecified: Secondary | ICD-10-CM | POA: Diagnosis not present

## 2017-08-23 DIAGNOSIS — Z8249 Family history of ischemic heart disease and other diseases of the circulatory system: Secondary | ICD-10-CM | POA: Diagnosis not present

## 2017-08-23 DIAGNOSIS — Z888 Allergy status to other drugs, medicaments and biological substances status: Secondary | ICD-10-CM | POA: Diagnosis not present

## 2017-08-23 DIAGNOSIS — Z886 Allergy status to analgesic agent status: Secondary | ICD-10-CM | POA: Diagnosis not present

## 2017-08-23 DIAGNOSIS — Z818 Family history of other mental and behavioral disorders: Secondary | ICD-10-CM | POA: Diagnosis not present

## 2017-08-23 DIAGNOSIS — M545 Low back pain, unspecified: Secondary | ICD-10-CM

## 2017-08-23 DIAGNOSIS — Z9889 Other specified postprocedural states: Secondary | ICD-10-CM | POA: Diagnosis not present

## 2017-08-23 DIAGNOSIS — R1032 Left lower quadrant pain: Secondary | ICD-10-CM | POA: Diagnosis not present

## 2017-08-23 DIAGNOSIS — G8929 Other chronic pain: Secondary | ICD-10-CM | POA: Diagnosis not present

## 2017-08-23 DIAGNOSIS — F419 Anxiety disorder, unspecified: Secondary | ICD-10-CM | POA: Insufficient documentation

## 2017-08-23 DIAGNOSIS — Z981 Arthrodesis status: Secondary | ICD-10-CM | POA: Insufficient documentation

## 2017-08-23 MED ORDER — OXYCODONE HCL 10 MG PO TABS: 10.0000 mg | ORAL_TABLET | Freq: Four times a day (QID) | ORAL | 0 refills | Status: DC | PRN

## 2017-08-23 MED ORDER — ORPHENADRINE CITRATE 30 MG/ML IJ SOLN
60.0000 mg | Freq: Once | INTRAMUSCULAR | Status: AC
  Administered 2017-08-23: 60 mg via INTRAMUSCULAR
  Filled 2017-08-23: qty 2

## 2017-08-23 MED ORDER — CYCLOBENZAPRINE HCL 5 MG PO TABS: 5.0000 mg | ORAL_TABLET | Freq: Three times a day (TID) | ORAL | 0 refills | Status: DC | PRN

## 2017-08-23 MED ORDER — KETOROLAC TROMETHAMINE 60 MG/2ML IM SOLN
60.0000 mg | Freq: Once | INTRAMUSCULAR | Status: AC
  Administered 2017-08-23: 60 mg via INTRAMUSCULAR
  Filled 2017-08-23: qty 2

## 2017-08-23 NOTE — Progress Notes (Signed)
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 are WNL. No peripheral edema or cyanosis. No masses, redness, swelling, asymmetry, or associated skin lesions. No contractures.  Functional ROM: Unrestricted ROM          Functional ROM: Unrestricted ROM          Muscle Tone/Strength: Functionally intact. No obvious neuro-muscular anomalies detected.  Muscle Tone/Strength: Functionally intact. No obvious neuro-muscular anomalies detected.  Sensory (Neurological): Unimpaired          Sensory (Neurological): Unimpaired          Palpation: No palpable anomalies              Palpation: No palpable anomalies              Specialized Test(s): Deferred  Specialized Test(s): Deferred          Thoracic Spine Area Exam  Skin & Axial Inspection: No masses, redness, or swelling Alignment: Symmetrical Functional ROM: Unrestricted  ROM Stability: No instability detected Muscle Tone/Strength: Functionally intact. No obvious neuro-muscular anomalies detected. Sensory (Neurological): Unimpaired Muscle strength & Tone: No palpable anomalies  Lumbar Spine Area Exam  Skin & Axial Inspection: No masses, redness, or swelling Alignment: Symmetrical Functional ROM: Unrestricted ROM      Stability: No instability detected Muscle Tone/Strength: Functionally intact. No obvious neuro-muscular anomalies detected. Sensory (Neurological): Unimpaired Palpation: No palpable anomalies       Provocative Tests: Lumbar Hyperextension and rotation test: evaluation deferred today       Lumbar Lateral bending test: evaluation deferred today       Patrick's Maneuver: evaluation deferred today                    Gait & Posture Assessment  Ambulation: Unassisted Gait: Relatively normal for age and body habitus Posture: WNL   Lower Extremity Exam    Side: Right lower extremity  Side: Left lower extremity  Skin & Extremity Inspection: Skin color, temperature, and hair growth are WNL. No peripheral edema or cyanosis. No masses, redness, swelling, asymmetry, or associated skin lesions. No contractures.  Skin & Extremity Inspection: Skin color, temperature, and hair growth are WNL. No peripheral edema or cyanosis. No masses, redness, swelling, asymmetry, or associated skin lesions. No contractures.  Functional ROM: Unrestricted ROM          Functional ROM: Unrestricted ROM          Muscle Tone/Strength: Functionally intact. No obvious neuro-muscular anomalies detected.  Muscle Tone/Strength: Functionally intact. No obvious neuro-muscular anomalies detected.  Sensory (Neurological): Unimpaired  Sensory (Neurological): Unimpaired  Palpation: No palpable anomalies  Palpation: No palpable anomalies   Assessment  Primary Diagnosis & Pertinent Problem List: The primary encounter diagnosis was Chronic hip pain (Location of Secondary source of pain)  (Left). Diagnoses of Chronic groin pain (Location of Tertiary source of pain) (Left), Chronic low back pain (Location of Primary Source of Pain) (Bilateral) (R>L), and Chronic pain syndrome were also pertinent to this visit.  Status Diagnosis  Controlled Controlled Controlled 1. Chronic hip pain (Location of Secondary source of pain) (Left)   2. Chronic groin pain (Location of Tertiary source of pain) (Left)   3. Chronic low back pain (Location of Primary Source of Pain) (Bilateral) (R>L)   4. Chronic pain syndrome     Problems updated and reviewed during this visit: No problems updated. Plan of Care  Pharmacotherapy (Medications Ordered): Meds ordered this encounter  Medications  . ketorolac (TORADOL) injection 60 mg  . orphenadrine (NORFLEX) injection 60 mg  . cyclobenzaprine (FLEXERIL) 5 MG tablet    Sig: Take 1 tablet (5 mg total) by mouth 3 (three) times daily as needed for muscle spasms.    Dispense:  90 tablet    Refill:  0    Do not place this medication, or any other prescription from our practice, on "Automatic Refill". Patient may have prescription filled one day early if pharmacy is closed on scheduled refill date.    Order Specific Question:   Supervising Provider    Answer:   Milinda Pointer [phone]  . Oxycodone HCl 10 MG TABS    Sig: Take 1 tablet (10 mg total) by mouth every 6 (six) hours as needed.    Dispense:  120 tablet    Refill:  0    Fill one day early if pharmacy is closed on scheduled refill date. Do not fill until: 09/05/2017 To last until: 10/05/2017    Order Specific Question:   Supervising Provider    Answer:   Milinda Pointer [phone]  . Oxycodone HCl 10 MG TABS    Sig: Take 1 tablet (10 mg total) by mouth every 6 (six) hours as needed.    Dispense:  120 tablet    Refill:  0    Fill one day early if pharmacy is closed on scheduled refill date. Do not fill until: 10/05/2017 To last until: 11/04/2017    Order Specific Question:   Supervising  Provider    Answer:   Milinda Pointer [phone]  . Oxycodone HCl 10 MG TABS    Sig: Take 1 tablet (10 mg total) by mouth every 6 (six) hours as needed.    Dispense:  120 tablet    Refill:  0    Patient may have prescription filled one day early if pharmacy is closed on scheduled refill date. Do not fill until: 11/04/2017 To last until: 12/04/2017    Order Specific Question:   Supervising Provider    Answer:   Milinda Pointer [phone]   New Prescriptions   CYCLOBENZAPRINE (FLEXERIL) 5 MG TABLET    Take 1 tablet (5 mg total) by mouth 3 (three) times daily as needed for muscle spasms.   Medications administered today: We administered ketorolac and orphenadrine. Lab-work, procedure(s), and/or referral(s): Orders Placed This Encounter  Procedures  . Ambulatory referral to Orthopedic Surgery  . Ambulatory referral to Orthopedic Surgery   Imaging and/or referral(s): AMB REFERRAL TO ORTHOPEDIC SURGERY AMB REFERRAL TO ORTHOPEDIC SURGERY  Interventional management options: Planned, scheduled, and/or pending:   Not at this time   Considering:   Diagnostic bilateral lumbar facet block #2under fluoro and sedation. Diagnostic bilateral thoracolumbar facet block. Possible bilateral thoracolumbar facet radiofrequencyablation.  Diagnostic left intra-articular hip joint injection. Diagnostic left femoral and obturator nerve articular branch blocks.  Possible left femoral and obturator nerve articular branch radiofrequencyablation.  Diagnostic bilateral genicular nerve blocks.  Possible bilateral genicular nerve radiofrequencyablation.  Diagnosticintrathecal pump trial.    Palliative PRN treatment(s):   Palliative bilateral lumbar facet block     Provider-requested follow-up: Return in about 3 months (around 11/23/2017) for MedMgmt.  Future Appointments Date Time Provider [place]  08/24/2017 9:00 AM Leandrew Koyanagi, MD [facility] None  09/11/2017 9:45 AM [name], MD [facility] None  11/12/2017 10:45 AM Vevelyn Francois, NP [facility] None   Primary Care Physician: [name], MD Location: [facility] Note by: Vevelyn Francois NP Date: 08/23/2017; Time: 3:43 PM  Pain Score Disclaimer: We use the NRS-11 scale. This is a self-reported, subjective measurement of pain severity with only modest accuracy. It is used primarily to identify changes within a particular patient. It must be understood that outpatient pain scales are significantly less accurate that those used for research, where they can be applied under ideal controlled circumstances with minimal exposure to variables. In reality, the score is likely to be a combination of pain intensity and pain affect, where pain affect describes the degree of emotional arousal or changes in action readiness caused by the sensory experience of pain. Factors such as social and work situation, setting, emotional state, anxiety levels, expectation, and prior pain experience may influence pain perception and show large inter-individual differences that may also be affected by time variables.  Patient instructions provided during this appointment: Patient Instructions    ____________________________________________________________________________________________  Medication Rules  Applies to: All patients receiving prescriptions (written or electronic).  Pharmacy of record: Pharmacy where electronic prescriptions will be sent. If written prescriptions are taken to a different pharmacy, please inform the nursing staff. The pharmacy listed in the electronic medical record should be the one where you would like electronic prescriptions to be sent.  Prescription refills: Only during scheduled appointments. Applies to both, written and electronic prescriptions.  NOTE: The following applies primarily to controlled substances (Opioid* Pain Medications).   Patient's  responsibilities: 1. Pain Pills: Bring all pain pills to every appointment (except for procedure appointments). 2. Pill Bottles: Bring pills in original pharmacy bottle. Always bring newest bottle. Bring bottle, even if empty. 3. Medication refills: You are responsible for knowing and keeping track of what medications you need refilled. The day before your appointment, write a list of all prescriptions that need to be refilled. Bring that list to your appointment and give it to the admitting nurse. Prescriptions will be written only during appointments. If you forget a medication, it will not be "Called in", "Faxed", or "electronically sent". You will need to get another appointment to get these prescribed. 4. Prescription Accuracy: You are responsible for carefully inspecting your prescriptions before leaving our office. Have the discharge nurse carefully go over each prescription with you, before taking them home. Make sure that your name is accurately spelled, that your address is correct. Check the name and dose of your medication to make sure it is accurate. Check the number of pills, and the written instructions to make sure they are clear and accurate. Make sure that you are given enough medication to last until your next medication refill appointment. 5. Taking Medication: Take medication as prescribed. Never take more pills than instructed. Never take medication more frequently than prescribed. Taking less pills or less frequently is permitted and encouraged, when it comes to controlled substances (written prescriptions).  6. Inform other Doctors: Always inform, all of your healthcare providers, of all the medications you take. 7. Pain Medication from other Providers: You are not allowed to accept any additional pain medication from any other Doctor or Healthcare provider. There are two exceptions to this rule. (see below) In the event that you require additional pain medication, you are responsible  for notifying us, as stated below. 8. Medication Agreement: You are responsible for carefully reading and following our Medication Agreement. This must be signed before receiving any prescriptions from our practice. Safely store a copy of your signed Agreement. Violations to the Agreement will result in no further prescriptions. (Additional copies of our Medication Agreement are available upon request.) 9. Laws, Rules, & Regulations: All patients are expected to follow all Federal and [employer], Rules, [employer]. Ignorance of the Laws does not constitute a valid excuse. The use of any illegal substances is prohibited. 10. Adopted CDC guidelines & recommendations: Target dosing levels will be at or below 60 MME/day. Use of benzodiazepines** is not recommended.  Exceptions: There are only two exceptions to the rule of not receiving pain medications from other Healthcare Providers. 1. Exception #1 (Emergencies): In the event of an emergency (i.e.: accident requiring emergency care), you are allowed to receive additional pain medication. However, you are responsible for: As soon as you are able, call our office [phone], at any time of the day or night, and leave a message stating your name, the date and nature  of the emergency, and the name and dose of the medication prescribed. In the event that your call is answered by a member of our staff, make sure to document and save the date, time, and the name of the person that took your information.  2. Exception #2 (Planned Surgery): In the event that you are scheduled by another doctor or dentist to have any type of surgery or procedure, you are allowed (for a period no longer than 30 days), to receive additional pain medication, for the acute post-op pain. However, in this case, you are responsible for picking up a copy of our "Post-op Pain Management for Surgeons" handout, and giving it to your surgeon or dentist. This document is available at  our office, and does not require an appointment to obtain it. Simply go to our office during business hours (Monday-Thursday from 8:00 AM to 4:00 PM) (Friday 8:00 AM to 12:00 Noon) or if you have a scheduled appointment with [place], prior to your surgery, and ask for it by name. In addition, you will need to provide [place] with your name, name of your surgeon, type of surgery, and date of procedure or surgery.  *Opioid medications include: morphine, codeine, oxycodone, oxymorphone, hydrocodone, hydromorphone, meperidine, tramadol, tapentadol, buprenorphine, fentanyl, methadone. **Benzodiazepine medications include: diazepam (Valium), alprazolam (Xanax), clonazepam (Klonopine), lorazepam (Ativan), clorazepate (Tranxene), chlordiazepoxide (Librium), estazolam (Prosom), oxazepam (Serax), temazepam (Restoril), triazolam (Halcion)  ____________________________________________________________________________________________ ____________________________________________________________________________________________  Pain Scale  Introduction: The pain score used by this practice is the Verbal Numerical Rating Scale (VNRS-11). This is an 11-point scale. It is for adults and children 10 years or older. There are significant differences in how the pain score is reported, used, and applied. Forget everything you learned in the past and learn this scoring system.  General Information: The scale should reflect your current level of pain. Unless you are specifically asked for the level of your worst pain, or your average pain. If you are asked for one of these two, then it should be understood that it is over the past 24 hours.  Basic Activities of Daily Living (ADL): Personal hygiene, dressing, eating, transferring, and using restroom.  Instructions: Most patients tend to report their level of pain as a combination of two factors, their physical pain and their psychosocial pain. This last one is also known as "suffering"  and it is reflection of how physical pain affects you socially and psychologically. From now on, report them separately. From this point on, when asked to report your pain level, report only your physical pain. Use the following table for reference.  Pain Clinic Pain Levels (0-5/10)  Pain Level Score  Description  No Pain 0   Mild pain 1 Nagging, annoying, but does not interfere with basic activities of daily living (ADL). Patients are able to eat, bathe, get dressed, toileting (being able to get on and off the toilet and perform personal hygiene functions), transfer (move in and out of bed or a chair without assistance), and maintain continence (able to control bladder and bowel functions). Blood pressure and heart rate are unaffected. A normal heart rate for a healthy adult ranges from 60 to 100 bpm (beats per minute).   Mild to moderate pain 2 Noticeable and distracting. Impossible to hide from other people. More frequent flare-ups. Still possible to adapt and function close to normal. It can be very annoying and may have occasional stronger flare-ups. With discipline, patients may get used to it and adapt.   Moderate pain 3 Interferes  significantly with activities of daily living (ADL). It becomes difficult to feed, bathe, get dressed, get on and off the toilet or to perform personal hygiene functions. Difficult to get in and out of bed or a chair without assistance. Very distracting. With effort, it can be ignored when deeply involved in activities.   Moderately severe pain 4 Impossible to ignore for more than a few minutes. With effort, patients may still be able to manage work or participate in some social activities. Very difficult to concentrate. Signs of autonomic nervous system discharge are evident: dilated pupils (mydriasis); mild sweating (diaphoresis); sleep interference. Heart rate becomes elevated (>115 bpm). Diastolic blood pressure (lower number) rises above 100 mmHg. Patients find  relief in laying down and not moving.   Severe pain 5 Intense and extremely unpleasant. Associated with frowning face and frequent crying. Pain overwhelms the senses.  Ability to do any activity or maintain social relationships becomes significantly limited. Conversation becomes difficult. Pacing back and forth is common, as getting into a comfortable position is nearly impossible. Pain wakes you up from deep sleep. Physical signs will be obvious: pupillary dilation; increased sweating; goosebumps; brisk reflexes; cold, clammy hands and feet; nausea, vomiting or dry heaves; loss of appetite; significant sleep disturbance with inability to fall asleep or to remain asleep. When persistent, significant weight loss is observed due to the complete loss of appetite and sleep deprivation.  Blood pressure and heart rate becomes significantly elevated. Caution: If elevated blood pressure triggers a pounding headache associated with blurred vision, then the patient should immediately seek attention at an urgent or emergency care unit, as these may be signs of an impending stroke.    Emergency Department Pain Levels (6-10/10)  Emergency Room Pain 6 Severely limiting. Requires emergency care and should not be seen or managed at an outpatient pain management facility. Communication becomes difficult and requires great effort. Assistance to reach the emergency department may be required. Facial flushing and profuse sweating along with potentially dangerous increases in heart rate and blood pressure will be evident.   Distressing pain 7 Self-care is very difficult. Assistance is required to transport, or use restroom. Assistance to reach the emergency department will be required. Tasks requiring coordination, such as bathing and getting dressed become very difficult.   Disabling pain 8 Self-care is no longer possible. At this level, pain is disabling. The individual is unable to do even the most "basic" activities such  as walking, eating, bathing, dressing, transferring to a bed, or toileting. Fine motor skills are lost. It is difficult to think clearly.   Incapacitating pain 9 Pain becomes incapacitating. Thought processing is no longer possible. Difficult to remember your own name. Control of movement and coordination are lost.   The worst pain imaginable 10 At this level, most patients pass out from pain. When this level is reached, collapse of the autonomic nervous system occurs, leading to a sudden drop in blood pressure and heart rate. This in turn results in a temporary and dramatic drop in blood flow to the brain, leading to a loss of consciousness. Fainting is one of the body's self defense mechanisms. Passing out puts the brain in a calmed state and causes it to shut down for a while, in order to begin the healing process.    Summary: 1. Refer to this scale when providing [place] with your pain level. 2. Be accurate and careful when reporting your pain level. This will help with your care. 3. Over-reporting your pain level  will lead to loss of credibility. 4. Even a level of 1/10 means that there is pain and will be treated at our facility. 5. High, inaccurate reporting will be documented as "Symptom Exaggeration", leading to loss of credibility and suspicions of possible secondary gains such as obtaining more narcotics, or wanting to appear disabled, for fraudulent reasons. 6. Only pain levels of 5 or below will be seen at our facility. 7. Pain levels of 6 and above will be sent to the Emergency Department and the appointment cancelled. ____________________________________________________________________________________________

## 2017-08-23 NOTE — Patient Instructions (Signed)

## 2017-08-23 NOTE — Progress Notes (Signed)
\  [id number]\\[id number] Pain Medication Assessment:  Safety precautions to be maintained throughout the outpatient stay will include: orient to surroundings, keep bed in low position, maintain call bell within reach at all times, provide assistance with transfer out of bed and ambulation.  Medication Inspection Compliance: Pill count conducted under aseptic conditions, in front of the patient. Neither the pills nor the bottle was removed from the patient's sight at any time. Once count was completed pills were immediately returned to the patient in their original bottle.  Medication: Oxycodone IR Pill/Patch Count: 33 of 120 pills remain Pill/Patch Appearance: Markings consistent with prescribed medication Bottle Appearance: Standard pharmacy container. Clearly labeled. Filled Date: 28 / 01 / 2018 Last Medication intake:  Yesterday

## 2017-08-24 ENCOUNTER — Ambulatory Visit (INDEPENDENT_AMBULATORY_CARE_PROVIDER_SITE_OTHER): Payer: Medicare PPO | Admitting: Orthopaedic Surgery

## 2017-08-27 ENCOUNTER — Ambulatory Visit (INDEPENDENT_AMBULATORY_CARE_PROVIDER_SITE_OTHER): Payer: Medicare PPO | Admitting: Orthopaedic Surgery

## 2017-08-27 ENCOUNTER — Ambulatory Visit (INDEPENDENT_AMBULATORY_CARE_PROVIDER_SITE_OTHER): Payer: Medicare PPO

## 2017-08-27 ENCOUNTER — Ambulatory Visit: Payer: Self-pay | Admitting: Pain Medicine

## 2017-08-27 ENCOUNTER — Encounter (INDEPENDENT_AMBULATORY_CARE_PROVIDER_SITE_OTHER): Payer: Self-pay | Admitting: Orthopaedic Surgery

## 2017-08-27 DIAGNOSIS — M1612 Unilateral primary osteoarthritis, left hip: Secondary | ICD-10-CM

## 2017-08-27 NOTE — Progress Notes (Signed)
The patient is here continue to follow-up for her left hip.  She has moderate arthritis in her hip from x-rays in June with a CT scan later this year showed worsening arthritis in her left hip.  Her pain is daily and it is 10 out of 10.  She is ambulating with a cane.  She is a very thin individual.  This pain is detrimentally affect directed daily living, quality of life, and mobility.  On exam her right hip is normal.  Her left hip has severe pain with internal and external rotation and the pain is in the groin.  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  There is joint space narrowing as well as cystic changes and sclerotic changes.  There is periarticular osteophytes as well.  At this point I agree that she is a perfect candidate for total hip arthroplasty.  She has tried and failed all forms of conservative treatment for well over 6 months to a year now.  I do feel that this could help decrease her pain and improve her quality of life.  A thorough discussion of the risk and benefits of surgery was had she already had a handout on hip replacement surgery through the direct anterior approach and understands this fully.  I showed her hip model explained this as well.  We will work on getting surgery set up for her in the near future and then see her back in 2 weeks postoperative.

## 2017-08-28 ENCOUNTER — Telehealth: Payer: Self-pay | Admitting: Pain Medicine

## 2017-08-28 NOTE — Telephone Encounter (Signed)
Scheduled for a hip replacement Nov. 13.

## 2017-08-28 NOTE — Telephone Encounter (Signed)
Wants to speak with nurse per vmail

## 2017-08-30 ENCOUNTER — Telehealth: Payer: Self-pay | Admitting: *Deleted

## 2017-08-30 NOTE — Telephone Encounter (Signed)
Will run out of Oxycodone early because has been taking more that prescribed. Not out yet, but will be out before MR date of 09-05-17. Spoke with Ms. Edison Pace, no further prescriptions will be given. Patient notified.

## 2017-08-30 NOTE — Telephone Encounter (Signed)
Please see what she needs. We gave her an injection (Toradol and Norflex) for her hip pain along with her regular refills.  She was suppose to have an appt with ortho prefer [facility]  thanks

## 2017-08-30 NOTE — Telephone Encounter (Signed)
Asking for Hydrocodone. Spoke with Ms. Edison Pace, no Hydrocodone will be written, but she may come for another Toradol/Norflex injection. Patient states the previous injecton helped a lot. Will call next week if needs to come for another injection.

## 2017-08-30 NOTE — Telephone Encounter (Signed)
Please see how she is doing thanks

## 2017-09-03 ENCOUNTER — Telehealth (INDEPENDENT_AMBULATORY_CARE_PROVIDER_SITE_OTHER): Payer: Self-pay | Admitting: Orthopaedic Surgery

## 2017-09-03 ENCOUNTER — Encounter: Payer: Self-pay | Admitting: Nurse Practitioner

## 2017-09-03 ENCOUNTER — Ambulatory Visit: Payer: Medicare PPO | Attending: Nurse Practitioner | Admitting: Nurse Practitioner

## 2017-09-03 VITALS — BP 100/77 | HR 108 | Temp 97.7°F | Resp 16 | Ht <= 58 in | Wt 117.0 lb

## 2017-09-03 DIAGNOSIS — Z96653 Presence of artificial knee joint, bilateral: Secondary | ICD-10-CM | POA: Insufficient documentation

## 2017-09-03 DIAGNOSIS — E669 Obesity, unspecified: Secondary | ICD-10-CM | POA: Diagnosis not present

## 2017-09-03 DIAGNOSIS — M5126 Other intervertebral disc displacement, lumbar region: Secondary | ICD-10-CM | POA: Diagnosis not present

## 2017-09-03 DIAGNOSIS — M7918 Myalgia, other site: Secondary | ICD-10-CM | POA: Insufficient documentation

## 2017-09-03 DIAGNOSIS — M545 Low back pain: Secondary | ICD-10-CM | POA: Insufficient documentation

## 2017-09-03 DIAGNOSIS — Z79891 Long term (current) use of opiate analgesic: Secondary | ICD-10-CM | POA: Insufficient documentation

## 2017-09-03 DIAGNOSIS — E785 Hyperlipidemia, unspecified: Secondary | ICD-10-CM | POA: Insufficient documentation

## 2017-09-03 DIAGNOSIS — N390 Urinary tract infection, site not specified: Secondary | ICD-10-CM | POA: Insufficient documentation

## 2017-09-03 DIAGNOSIS — G894 Chronic pain syndrome: Secondary | ICD-10-CM | POA: Diagnosis not present

## 2017-09-03 DIAGNOSIS — R1012 Left upper quadrant pain: Secondary | ICD-10-CM | POA: Diagnosis present

## 2017-09-03 DIAGNOSIS — M47898 Other spondylosis, sacral and sacrococcygeal region: Secondary | ICD-10-CM | POA: Insufficient documentation

## 2017-09-03 DIAGNOSIS — M48061 Spinal stenosis, lumbar region without neurogenic claudication: Secondary | ICD-10-CM | POA: Diagnosis not present

## 2017-09-03 DIAGNOSIS — M25552 Pain in left hip: Secondary | ICD-10-CM | POA: Insufficient documentation

## 2017-09-03 DIAGNOSIS — G8918 Other acute postprocedural pain: Secondary | ICD-10-CM | POA: Diagnosis not present

## 2017-09-03 DIAGNOSIS — R1310 Dysphagia, unspecified: Secondary | ICD-10-CM | POA: Insufficient documentation

## 2017-09-03 DIAGNOSIS — R251 Tremor, unspecified: Secondary | ICD-10-CM | POA: Insufficient documentation

## 2017-09-03 DIAGNOSIS — R1011 Right upper quadrant pain: Secondary | ICD-10-CM | POA: Insufficient documentation

## 2017-09-03 DIAGNOSIS — M1610 Unilateral primary osteoarthritis, unspecified hip: Secondary | ICD-10-CM | POA: Insufficient documentation

## 2017-09-03 DIAGNOSIS — F419 Anxiety disorder, unspecified: Secondary | ICD-10-CM | POA: Insufficient documentation

## 2017-09-03 DIAGNOSIS — M25562 Pain in left knee: Secondary | ICD-10-CM | POA: Insufficient documentation

## 2017-09-03 DIAGNOSIS — M25561 Pain in right knee: Secondary | ICD-10-CM | POA: Diagnosis not present

## 2017-09-03 DIAGNOSIS — R1032 Left lower quadrant pain: Secondary | ICD-10-CM | POA: Diagnosis not present

## 2017-09-03 DIAGNOSIS — Z87442 Personal history of urinary calculi: Secondary | ICD-10-CM | POA: Insufficient documentation

## 2017-09-03 DIAGNOSIS — M546 Pain in thoracic spine: Secondary | ICD-10-CM | POA: Insufficient documentation

## 2017-09-03 DIAGNOSIS — K623 Rectal prolapse: Secondary | ICD-10-CM | POA: Diagnosis not present

## 2017-09-03 DIAGNOSIS — M961 Postlaminectomy syndrome, not elsewhere classified: Secondary | ICD-10-CM | POA: Diagnosis not present

## 2017-09-03 DIAGNOSIS — M47816 Spondylosis without myelopathy or radiculopathy, lumbar region: Secondary | ICD-10-CM | POA: Insufficient documentation

## 2017-09-03 DIAGNOSIS — G8929 Other chronic pain: Secondary | ICD-10-CM

## 2017-09-03 DIAGNOSIS — F329 Major depressive disorder, single episode, unspecified: Secondary | ICD-10-CM | POA: Insufficient documentation

## 2017-09-03 DIAGNOSIS — Z981 Arthrodesis status: Secondary | ICD-10-CM | POA: Diagnosis not present

## 2017-09-03 MED ORDER — ORPHENADRINE CITRATE 30 MG/ML IJ SOLN
60.0000 mg | Freq: Once | INTRAMUSCULAR | Status: AC
  Administered 2017-09-03: 60 mg via INTRAMUSCULAR
  Filled 2017-09-03: qty 2

## 2017-09-03 MED ORDER — KETOROLAC TROMETHAMINE 60 MG/2ML IM SOLN
60.0000 mg | Freq: Once | INTRAMUSCULAR | Status: AC
  Administered 2017-09-03: 60 mg via INTRAMUSCULAR
  Filled 2017-09-03: qty 2

## 2017-09-03 NOTE — Patient Instructions (Signed)

## 2017-09-03 NOTE — Progress Notes (Signed)
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 neuro-muscular anomalies detected.  Muscle Tone/Strength: Functionally intact. No obvious neuro-muscular anomalies detected.  Sensory (Neurological): Unimpaired  Sensory (Neurological): Unimpaired  Palpation: No palpable anomalies  Palpation: No palpable anomalies   Assessment  Primary Diagnosis & Pertinent Problem List: The primary encounter diagnosis was Chronic hip pain (Location of Secondary source of pain) (Left). Diagnoses of Chronic low back pain (Location of Primary Source of Pain) (Bilateral) (R>L), Musculoskeletal pain, and Chronic pain syndrome were also pertinent to this visit.  Status Diagnosis  Controlled Controlled Controlled 1. Chronic hip pain (Location of Secondary source of pain) (Left)   2. Chronic low back pain (Location  of Primary Source of Pain) (Bilateral) (R>L)  3. Musculoskeletal pain   4. Chronic pain syndrome     Problems updated and reviewed during this visit: No problems updated. Plan of Care  Pharmacotherapy (Medications Ordered): Meds ordered this encounter  Medications  . orphenadrine (NORFLEX) injection 60 mg  . ketorolac (TORADOL) injection 60 mg   New Prescriptions   No medications on file   Medications administered today: We administered orphenadrine and ketorolac. Lab-work, procedure(s), and/or referral(s): No orders of the defined types were placed in this encounter.  Imaging and/or referral(s): None  Interventional management options: Planned, scheduled, and/or pending:  Not at this time   Considering:  Diagnostic bilateral lumbar facet block #2under fluoro and sedation. Diagnostic bilateral thoracolumbar facet block. Possible bilateral thoracolumbar facet radiofrequencyablation.  Diagnostic left intra-articular hip joint injection. Diagnostic left femoral and obturator nerve articular branch blocks.  Possible left femoral and obturator nerve articular branch radiofrequencyablation.  Diagnostic bilateral genicular nerve blocks.  Possible bilateral genicular nerve radiofrequencyablation.  Diagnosticintrathecal pump trial.    Palliative PRN treatment(s):  Palliative bilateral lumbar facet block   Provider-requested follow-up: No Follow-up on file.  Future Appointments Date Time Provider [place]  09/11/2017 10:00 AM [name], MD [facility] None  10/02/2017 10:45 AM Mcarthur Rossetti, MD [facility] None  11/12/2017 10:45 AM Vevelyn Francois, NP [facility] None   Primary Care Physician: [name], MD Location: [facility] Note by: Vevelyn Francois NP Date: 09/03/2017; Time: 2:14 PM  Pain Score Disclaimer: We use the NRS-11 scale. This is a self-reported, subjective measurement of pain severity with  only modest accuracy. It is used primarily to identify changes within a particular patient. It must be understood that outpatient pain scales are significantly less accurate that those used for research, where they can be applied under ideal controlled circumstances with minimal exposure to variables. In reality, the score is likely to be a combination of pain intensity and pain affect, where pain affect describes the degree of emotional arousal or changes in action readiness caused by the sensory experience of pain. Factors such as social and work situation, setting, emotional state, anxiety levels, expectation, and prior pain experience may influence pain perception and show large inter-individual differences that may also be affected by time variables.  Patient instructions provided during this appointment: Patient Instructions   ____________________________________________________________________________________________  Pain Scale  Introduction: The pain score used by this practice is the Verbal Numerical Rating Scale (VNRS-11). This is an 11-point scale. It is for adults and children 10 years or older. There are significant differences in how the pain score is reported, used, and applied. Forget everything you learned in the past and learn this scoring system.  General Information: The scale should reflect your current level of pain. Unless you are specifically asked for the level of your worst pain, or your average pain. If you are asked for one of these two, then it should be understood that it is over the past 24 hours.  Basic Activities of Daily Living (ADL): Personal hygiene, dressing, eating, transferring, and using restroom.  Instructions: Most patients tend to report their level of pain as a combination of two factors, their physical pain and their psychosocial pain. This last one is also known as "suffering" and it is reflection of how physical pain affects you socially and  psychologically. From now on, report them separately. From this point on, when asked to report your pain level, report only your physical pain. Use the following table for reference.  Pain Clinic Pain Levels (0-5/10)  Pain Level Score  Description  No Pain 0   Mild pain 1 Nagging, annoying, but does not interfere with basic activities of daily living (ADL). Patients are able to eat, bathe, get dressed, toileting (being able to get on and off the toilet and perform personal hygiene functions), transfer (move in and out of bed or a chair without assistance), and maintain continence (able to control bladder and bowel functions). Blood pressure and heart rate are unaffected. A normal heart rate for a healthy adult ranges from 60 to 100 bpm (beats per minute).   Mild to moderate pain 2 Noticeable and distracting. Impossible to hide from other people. More frequent flare-ups. Still possible to adapt and function close to normal. It can be very annoying and may have occasional stronger flare-ups. With discipline, patients may get used to it and adapt.   Moderate pain 3 Interferes significantly with activities of daily living (ADL). It becomes difficult to feed, bathe, get dressed, get on and off the toilet or to perform personal hygiene functions. Difficult to get in and out of bed or a chair without assistance. Very distracting. With effort, it can be ignored when deeply involved in activities.   Moderately severe pain 4 Impossible to ignore for more than a few minutes. With effort, patients may still be able to manage work or participate in some social activities. Very difficult to concentrate. Signs of autonomic nervous system discharge are evident: dilated pupils (mydriasis); mild sweating (diaphoresis); sleep interference. Heart rate becomes elevated (>115 bpm). Diastolic blood pressure (lower number) rises above 100 mmHg. Patients find relief in laying down and not moving.   Severe pain 5 Intense and  extremely unpleasant. Associated with frowning face and frequent crying. Pain overwhelms the senses.  Ability to do any activity or maintain social relationships becomes significantly limited. Conversation becomes difficult. Pacing back and forth is common, as getting into a comfortable position is nearly impossible. Pain wakes you up from deep sleep. Physical signs will be obvious: pupillary dilation; increased sweating; goosebumps; brisk reflexes; cold, clammy hands and feet; nausea, vomiting or dry heaves; loss of appetite; significant sleep disturbance with inability to fall asleep or to remain asleep. When persistent, significant weight loss is observed due to the complete loss of appetite and sleep deprivation.  Blood pressure and heart rate becomes significantly elevated. Caution: If elevated blood pressure triggers a pounding headache associated with blurred vision, then the patient should immediately seek attention at an urgent or emergency care unit, as these may be signs of an impending stroke.    Emergency Department Pain Levels (6-10/10)  Emergency Room Pain 6 Severely limiting. Requires emergency care and should not be seen or managed at an outpatient pain management facility. Communication becomes difficult and requires great effort. Assistance to reach the emergency department may be required. Facial flushing and profuse sweating along with potentially dangerous increases in heart rate and blood pressure will be evident.   Distressing pain 7 Self-care is very difficult. Assistance is required to transport, or use restroom. Assistance to reach the emergency department will be required. Tasks requiring coordination, such as bathing and getting dressed become very difficult.   Disabling pain 8 Self-care is no longer possible. At this level, pain is disabling. The individual is unable to do even the most "basic" activities such as walking, eating, bathing, dressing, transferring to a bed, or  toileting. Fine motor skills are lost. It is difficult to think clearly.  Incapacitating pain 9 Pain becomes incapacitating. Thought processing is no longer possible. Difficult to remember your own name. Control of movement and coordination are lost.   The worst pain imaginable 10 At this level, most patients pass out from pain. When this level is reached, collapse of the autonomic nervous system occurs, leading to a sudden drop in blood pressure and heart rate. This in turn results in a temporary and dramatic drop in blood flow to the brain, leading to a loss of consciousness. Fainting is one of the body's self defense mechanisms. Passing out puts the brain in a calmed state and causes it to shut down for a while, in order to begin the healing process.    Summary: 1. Refer to this scale when providing [place] with your pain level. 2. Be accurate and careful when reporting your pain level. This will help with your care. 3. Over-reporting your pain level will lead to loss of credibility. 4. Even a level of 1/10 means that there is pain and will be treated at our facility. 5. High, inaccurate reporting will be documented as "Symptom Exaggeration", leading to loss of credibility and suspicions of possible secondary gains such as obtaining more narcotics, or wanting to appear disabled, for fraudulent reasons. 6. Only pain levels of 5 or below will be seen at our facility. 7. Pain levels of 6 and above will be sent to the Emergency Department and the appointment cancelled. ____________________________________________________________________________________________

## 2017-09-03 NOTE — Telephone Encounter (Signed)
Patient has a scheduled surgery Nov.13 and has since then has several questions in regards to this procedure. She wants a call back so she can get answers to these question prior to her surgery.

## 2017-09-03 NOTE — Progress Notes (Signed)
Safety precautions to be maintained throughout the outpatient stay will include: orient to surroundings, keep bed in low position, maintain call bell within reach at all times, provide assistance with transfer out of bed and ambulation.  

## 2017-09-04 NOTE — Telephone Encounter (Signed)
Patient worked in [employer]

## 2017-09-05 ENCOUNTER — Ambulatory Visit (INDEPENDENT_AMBULATORY_CARE_PROVIDER_SITE_OTHER): Payer: Medicare PPO | Admitting: Physician Assistant

## 2017-09-05 ENCOUNTER — Other Ambulatory Visit (INDEPENDENT_AMBULATORY_CARE_PROVIDER_SITE_OTHER): Payer: Self-pay | Admitting: Physician Assistant

## 2017-09-05 ENCOUNTER — Encounter (INDEPENDENT_AMBULATORY_CARE_PROVIDER_SITE_OTHER): Payer: Self-pay | Admitting: Physician Assistant

## 2017-09-05 ENCOUNTER — Ambulatory Visit (INDEPENDENT_AMBULATORY_CARE_PROVIDER_SITE_OTHER): Payer: Medicare PPO

## 2017-09-05 VITALS — Ht <= 58 in | Wt 117.0 lb

## 2017-09-05 DIAGNOSIS — M25562 Pain in left knee: Secondary | ICD-10-CM

## 2017-09-05 DIAGNOSIS — M7062 Trochanteric bursitis, left hip: Secondary | ICD-10-CM | POA: Diagnosis not present

## 2017-09-05 DIAGNOSIS — M1612 Unilateral primary osteoarthritis, left hip: Secondary | ICD-10-CM | POA: Diagnosis not present

## 2017-09-05 NOTE — Progress Notes (Signed)
Office Visit Note   Patient: Brittany Mays           Date of Birth: [birth year]           MRN: [id number] Visit Date: 09/05/2017              Requested by: [name], MD [address] [place], [place] [address] PCP: [name], MD   Assessment & Plan: Visit Diagnoses:  1. Acute pain of left knee   2. Trochanteric bursitis of left hip   3. Osteoarthritis of hip (Left)     Plan: Reassurance is given.  On clinical exam.  Also that the radiographs show a well-seated knee prosthesis without any signs of complication.  I talked to her about IT band stretching .  We will see her back postop for her left total hip arthroplasty. She is planning on going to a skilled nursing facility postop.  Follow-Up Instructions: Return for 2 weeks post op.   Orders:  Orders Placed This Encounter  Procedures  . XR Knee 1-2 Views Left   No orders of the defined types were placed in this encounter.     Procedures: No procedures performed   Clinical Data: No additional findings.   Subjective: Chief Complaint  Patient presents with  . Left Knee - Pain    HPI Mrs. Brittany Mays is well-known to Dr. Ninfa Linden service scheduled for left total hip arthroplasty on 09/18/2017.  She states that she is having electrical type pain in the left knee.  She does have a significant history of the low back pain with multiple surgeries of the lumbar spine.  She is currently in pain management clinic for her back pain.  She brings a note from her pain management clinic stating we should manage her postoperative pain meds.  She has questions postop rehab and is stating that she wants to go to skilled nursing facility for rehab postop.  She also has concerns over her severe constipation use of narcotics.  Review of Systems Positive for constipation.  Denies fevers chills shortness of breath chest pain or abdominal pain.  Objective: Vital Signs: Ht 4\' 10"  (1.473 m)   Wt 117 lb (53.1 kg)    BMI 24.45 kg/m   Physical Exam  Constitutional: She is oriented to person, place, and time. She appears well-developed and well-nourished. No distress.  Neurological: She is alert and oriented to person, place, and time.  Skin: She is not diaphoretic.  Psychiatric: She has a normal mood and affect.    Ortho Exam Left knee she has full range of motion.  No instability with valgus stressing.  Anterior drawer is negative for gross laxity.  There is no abnormal warmth erythema or effusion of the knee.  She has tenderness down the entire left IT band maximal tenderness at the insertion at the knee.  Left hip she has pain with internal and external rotation. Specialty Comments:  No specialty comments available.  Imaging: Xr Knee 1-2 Views Left  Result Date: 09/05/2017 AP and lateral views of the left knee: No acute fracture.  Knee is well located.  Status post left total knee arthroplasty without any signs of loosening or hardware failure.    PMFS History: Patient Active Problem List   Diagnosis Date Noted  . 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 Osteoarthritis of ankle or foot 05/25/2017  . Trochanteric bursitis of left hip 05/25/2017  . Osteoarthritis of hip (Left) 04/25/2017  .  Acute postoperative pain 02/14/2017  . Musculoskeletal pain 01/23/2017  . Tremor 01/10/2017  . Chronic pain syndrome 11/13/2016  . Chronic upper back pain (midline) 09/13/2016  . Lumbar spondylosis 08/15/2016  . Lumbar facet syndrome (Bilateral) (R>L) 07/19/2016  . Chronic groin pain (Location of Tertiary source of pain) (Left) 07/13/2016  . 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 Opioid-induced constipation (OIC) 07/12/2016  . Osteoarthritis, multiple sites 07/11/2016  . Chronic low back pain (Location of Primary Source of Pain) (Bilateral) (R>L) 07/11/2016  . 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 Chronic knee pain (Bilateral) (L>R) 07/11/2016  . Long term  current use of opiate analgesic 07/11/2016  . Long term prescription opiate use 07/11/2016  . Opiate use (60 MME/Day) 07/11/2016  . Encounter for pain management planning 07/11/2016  . Encounter for therapeutic drug level monitoring 07/11/2016  . Abnormal CT scan, lumbar spine (05/07/2015) 07/11/2016  . Osteoarthritis of sacroiliac joint (Bilateral) 07/11/2016  . Lumbar facet arthropathy ([place]) 07/11/2016  . Lumbar spondylolisthesis 07/11/2016  . Lumbar foraminal stenosis (Bilateral) 07/11/2016  . History of total bilateral knee replacement 07/11/2016  . Aortic aneurysm ([place]) 06/06/2016  . Hyperlipidemia 03/06/2016  . Chronic hip pain (Location of Secondary source of pain) (Left) 12/06/2015  . Solitary pulmonary nodule 09/10/2015  . Mechanical complication of internal fixation device such as nail, plate or rod ([place]) 88/50/2774  . 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 Pseudarthrosis following spinal fusion 07/29/2015  . Generalized weakness 06/02/2014  . History of lumbar fusion (T10 through L3 fusion) 04/28/2014  . Obesity (BMI 30-39.9) 06/23/2013  . Anxiety and depression   . Lumbar spinal stenosis    Past Medical History:  Diagnosis Date  . Anxiety   . Aortic aneurysm ([place]) 06/06/2016  . Cataract   . Chest pain 08/18/2015  . Decreased muscle strength 02/10/2014  . Depression   . Difficulty in walking(719.7) 02/10/2014  . Dysphagia 08/19/2015  . Dyspnea 08/19/2015  . Hyperparathyroidism, primary ([place])    s/p parathyroidectomy  . Hyperventilation 08/18/2015  . Insomnia   . Intractable back pain 04/29/2014  . Kidney stone   . Lumbar spinal stenosis    s/p laminectomy  . Odynophagia 08/18/2015  . Osteoarthritis of both knees    s/p knee replacements  . Pain   . Rectal prolapse    s/p repair  . Unintentional weight loss 09/08/2015  . UTI (lower urinary tract infection) 04/28/2014    Family History  Problem Relation Age of Onset  . Mental illness Mother   .  COPD Father   . Heart failure Father   . Diabetes Son     Past Surgical History:  Procedure Laterality Date  . ABDOMINAL HYSTERECTOMY    . APPENDECTOMY    . BACK SURGERY  07-2015   major surgery  . BREAST LUMPECTOMY     Reports this is benign  . colonoscopy  2013  . KNEE SURGERY Bilateral 1990's nd 2002   Knee Replacements  . PARATHYROIDECTOMY  1990's  . SPINE SURGERY    . TONSILLECTOMY     Social History   Occupational History  . Not on file.   Social History Main Topics  . Smoking status: Current Every Day Smoker    Packs/day: 0.75    Years: 50.00    Types: Cigarettes  . Smokeless tobacco: Never Used  . Alcohol use No  . Drug use: No  . Sexual activity: Not Currently

## 2017-09-07 ENCOUNTER — Telehealth (INDEPENDENT_AMBULATORY_CARE_PROVIDER_SITE_OTHER): Payer: Self-pay | Admitting: Orthopaedic Surgery

## 2017-09-07 NOTE — Telephone Encounter (Signed)
I think she is on a good bowel regimen and should do well with this.

## 2017-09-07 NOTE — Telephone Encounter (Signed)
IC s/w patient and advised  

## 2017-09-07 NOTE — Telephone Encounter (Signed)
Patient called advised she is having some problems with bowel problems and abdominal cramping. Patient said she spoke to [place] briefly at her last visit. Patient said she is a little concerned because she is having surgery 09/18/17. The number to contact patient is [phone]

## 2017-09-07 NOTE — Telephone Encounter (Signed)
Patient called stating that she is having surgery on November 13th, but is having bowels and wants to know if it is safe to go ahead with the surgery.  CB#[phone].  Thank you.

## 2017-09-07 NOTE — Telephone Encounter (Signed)
I called patient to clarify question and what she may need. Unable to leave a voicemail.

## 2017-09-07 NOTE — Telephone Encounter (Signed)
See other note. I have called.

## 2017-09-07 NOTE — Telephone Encounter (Signed)
IC s/w patient and she wanted to make sure that you were aware for the last 2 months she has been treated at [facility] for some bowel trouble. She said that she is on a bowel regimen of colace, miralax, and align.  She has a lot of concerns about being on this and having this major surgery. She said that she did speak to [facility] and they felt she would be ok to proceed but she wants to have everyone on the same page and see what your thoughts were. Please advise. Thanks.

## 2017-09-10 ENCOUNTER — Ambulatory Visit (INDEPENDENT_AMBULATORY_CARE_PROVIDER_SITE_OTHER): Payer: Medicare PPO | Admitting: Orthopaedic Surgery

## 2017-09-10 NOTE — Pre-Procedure Instructions (Signed)
Brittany Mays  09/10/2017      [facility] - [place], [place] - [address] AT [place] [address] Phone: [phone] Fax: [phone]  [facility] - [place], [place] [address] Phone: [phone] Fax: [phone]    Your procedure is scheduled on 09-18-2017  Tuesday  Report to [facility] Admitting at 1:30 PM .  Call this number if you have problems the morning of surgery:  [phone]   Remember:  Do not eat food or drink liquids after midnight.   Take these medicines the morning of surgery with A SIP OF WATER Duloxetine(Cymbalta),oxycodone,    STOP ASPIRIN,ANTIINFLAMATORIES (IBUPROFEN,ALEVE,MOTRIN,ADVIL,GOODY'S POWDERS),HERBAL SUPPLEMENTS,FISH OIL,AND VITAMINS 5-7 DAYS PRIOR TO SURGERY   Do not wear jewelry, make-up or nail polish.  Do not wear lotions, powders, or perfumes, or deoderant.  Do not shave 48 hours prior to surgery.  Men may shave face and neck.  Do not bring valuables to the hospital.  [facility] is not responsible for any belongings or valuables.  Contacts, dentures or bridgework may not be worn into surgery.  Leave your suitcase in the car.  After surgery it may be brought to your room.  For patients admitted to the hospital, discharge time will be determined by your treatment team.  Patients discharged the day of surgery will not be allowed to drive home.   Special Instructions: [facility] - Preparing for Surgery  Before surgery, you can play an important role.  Because skin is not sterile, your skin needs to be as free of germs as possible.  You can reduce the number of germs on you skin by washing with CHG (chlorahexidine gluconate) soap before surgery.  CHG is an antiseptic cleaner which kills germs and bonds with the skin to continue killing germs even after washing.  Please DO NOT use if  you have an allergy to CHG or antibacterial soaps.  If your skin becomes reddened/irritated stop using the CHG and inform your nurse when you arrive at Short Stay.  Do not shave (including legs and underarms) for at least 48 hours prior to the first CHG shower.  You may shave your face.  Please follow these instructions carefully:   1.  Shower with CHG Soap the night before surgery and the   morning of Surgery.  2.  If you choose to wash your hair, wash your hair first as usual with your normal shampoo.  3.  After you shampoo, rinse your hair and body thoroughly to remove the  Shampoo.  4.  Use CHG as you would any other liquid soap.  You can apply chg directly  to the skin and wash gently with scrungie or a clean washcloth.  5.  Apply the CHG Soap to your body ONLY FROM THE NECK DOWN.   Do not use on open wounds or open sores.  Avoid contact with your eyes,  ears, mouth and genitals (private parts).  Wash genitals (private parts) with your normal soap.  6.  Wash thoroughly, paying special attention to the area where your surgery will be performed.  7.  Thoroughly rinse your body with warm water from the neck down.  8.  DO NOT shower/wash with your normal soap after using and rinsing o  the CHG Soap.  9.  Pat yourself dry with a clean  towel.            10.  Wear clean pajamas.            11.  Place clean sheets on your bed the night of your first shower and do not sleep with pets.  Day of Surgery  Do not apply any lotions/deodorants the morning of surgery.  Please wear clean clothes to the hospital/surgery center.     Please read over the following fact sheets that you were given. MRSA Information and Surgical Site Infection Prevention

## 2017-09-11 ENCOUNTER — Ambulatory Visit: Payer: Medicare PPO | Admitting: Pain Medicine

## 2017-09-11 ENCOUNTER — Encounter (HOSPITAL_COMMUNITY): Payer: Self-pay

## 2017-09-11 ENCOUNTER — Ambulatory Visit: Payer: Self-pay | Admitting: Family Medicine

## 2017-09-11 ENCOUNTER — Encounter (HOSPITAL_COMMUNITY)
Admission: RE | Admit: 2017-09-11 | Discharge: 2017-09-11 | Disposition: A | Discharge status: Home / Self Care | Payer: Medicare PPO | Source: Ambulatory Visit | Attending: Orthopaedic Surgery | Admitting: Orthopaedic Surgery

## 2017-09-11 DIAGNOSIS — Z01810 Encounter for preprocedural cardiovascular examination: Secondary | ICD-10-CM | POA: Insufficient documentation

## 2017-09-11 DIAGNOSIS — Z01812 Encounter for preprocedural laboratory examination: Secondary | ICD-10-CM | POA: Insufficient documentation

## 2017-09-11 HISTORY — DX: Personal history of urinary calculi: Z87.442

## 2017-09-11 HISTORY — DX: Adverse effect of unspecified anesthetic, initial encounter: T41.45XA

## 2017-09-11 HISTORY — DX: Adverse effect of other opioids, initial encounter: T40.2X5A

## 2017-09-11 HISTORY — DX: Drug induced constipation: K59.03

## 2017-09-11 HISTORY — DX: Gastro-esophageal reflux disease without esophagitis: K21.9

## 2017-09-11 HISTORY — DX: Other complications of anesthesia, initial encounter: T88.59XA

## 2017-09-11 LAB — CBC
HEMATOCRIT: 43.1 % (ref 36.0–46.0)
Hemoglobin: 14.7 g/dL (ref 12.0–15.0)
MCH: 32.2 pg (ref 26.0–34.0)
MCHC: 34.1 g/dL (ref 30.0–36.0)
MCV: 94.5 fL (ref 78.0–100.0)
PLATELETS: 219 10*3/uL (ref 150–400)
RBC: 4.56 MIL/uL (ref 3.87–5.11)
RDW: 13.6 % (ref 11.5–15.5)
WBC: 7.3 10*3/uL (ref 4.0–10.5)

## 2017-09-11 LAB — BASIC METABOLIC PANEL
Anion gap: 8 (ref 5–15)
BUN: 16 mg/dL (ref 6–20)
CALCIUM: 9.3 mg/dL (ref 8.9–10.3)
CO2: 25 mmol/L (ref 22–32)
CREATININE: 0.77 mg/dL (ref 0.44–1.00)
Chloride: 104 mmol/L (ref 101–111)
GFR calc Af Amer: >60 mL/min (ref >60)
GLUCOSE: 92 mg/dL (ref 65–99)
POTASSIUM: 4.2 mmol/L (ref 3.5–5.1)
SODIUM: 137 mmol/L (ref 135–145)

## 2017-09-11 LAB — SURGICAL PCR SCREEN
MRSA, PCR: NEGATIVE
STAPHYLOCOCCUS AUREUS: NEGATIVE

## 2017-09-11 NOTE — Progress Notes (Signed)
Denies any Cardiac history,never seen by cardiologist  Denies any Cardiac testing

## 2017-09-14 ENCOUNTER — Other Ambulatory Visit (INDEPENDENT_AMBULATORY_CARE_PROVIDER_SITE_OTHER): Payer: Self-pay

## 2017-09-17 MED ORDER — CEFAZOLIN SODIUM-DEXTROSE 2-4 GM/100ML-% IV SOLN
2.0000 g | INTRAVENOUS | Status: AC
  Administered 2017-09-18: 2 g via INTRAVENOUS
  Filled 2017-09-17: qty 100

## 2017-09-17 MED ORDER — TRANEXAMIC ACID 1000 MG/10ML IV SOLN
1000.0000 mg | INTRAVENOUS | Status: AC
  Administered 2017-09-18: 1000 mg via INTRAVENOUS
  Filled 2017-09-17: qty 1100

## 2017-09-18 ENCOUNTER — Inpatient Hospital Stay (HOSPITAL_COMMUNITY): Payer: Medicare PPO | Admitting: Anesthesiology

## 2017-09-18 ENCOUNTER — Inpatient Hospital Stay (HOSPITAL_COMMUNITY): Payer: Medicare PPO

## 2017-09-18 ENCOUNTER — Encounter (HOSPITAL_COMMUNITY)
Admission: RE | Disposition: A | Discharge status: Skilled Nursing Facility | Payer: Self-pay | Source: Ambulatory Visit | Attending: Orthopaedic Surgery

## 2017-09-18 ENCOUNTER — Encounter (HOSPITAL_COMMUNITY): Payer: Self-pay | Admitting: *Deleted

## 2017-09-18 ENCOUNTER — Inpatient Hospital Stay (HOSPITAL_COMMUNITY)
Admission: RE | Admit: 2017-09-18 | Discharge: 2017-09-21 | DRG: 470 | Disposition: A | Discharge status: Skilled Nursing Facility | Payer: Medicare PPO | Source: Ambulatory Visit | Attending: Orthopaedic Surgery | Admitting: Orthopaedic Surgery

## 2017-09-18 DIAGNOSIS — M19079 Primary osteoarthritis, unspecified ankle and foot: Secondary | ICD-10-CM | POA: Diagnosis present

## 2017-09-18 DIAGNOSIS — Z90710 Acquired absence of both cervix and uterus: Secondary | ICD-10-CM | POA: Diagnosis not present

## 2017-09-18 DIAGNOSIS — Z96642 Presence of left artificial hip joint: Secondary | ICD-10-CM

## 2017-09-18 DIAGNOSIS — Z981 Arthrodesis status: Secondary | ICD-10-CM | POA: Diagnosis not present

## 2017-09-18 DIAGNOSIS — D62 Acute posthemorrhagic anemia: Secondary | ICD-10-CM | POA: Diagnosis not present

## 2017-09-18 DIAGNOSIS — M160 Bilateral primary osteoarthritis of hip: Principal | ICD-10-CM | POA: Diagnosis present

## 2017-09-18 DIAGNOSIS — M1612 Unilateral primary osteoarthritis, left hip: Secondary | ICD-10-CM | POA: Diagnosis not present

## 2017-09-18 DIAGNOSIS — I739 Peripheral vascular disease, unspecified: Secondary | ICD-10-CM | POA: Diagnosis not present

## 2017-09-18 DIAGNOSIS — Z888 Allergy status to other drugs, medicaments and biological substances status: Secondary | ICD-10-CM

## 2017-09-18 DIAGNOSIS — Z9181 History of falling: Secondary | ICD-10-CM | POA: Diagnosis not present

## 2017-09-18 DIAGNOSIS — K219 Gastro-esophageal reflux disease without esophagitis: Secondary | ICD-10-CM | POA: Diagnosis present

## 2017-09-18 DIAGNOSIS — Z96653 Presence of artificial knee joint, bilateral: Secondary | ICD-10-CM | POA: Diagnosis present

## 2017-09-18 DIAGNOSIS — Z79891 Long term (current) use of opiate analgesic: Secondary | ICD-10-CM

## 2017-09-18 DIAGNOSIS — M545 Low back pain: Secondary | ICD-10-CM | POA: Diagnosis not present

## 2017-09-18 DIAGNOSIS — Z884 Allergy status to anesthetic agent status: Secondary | ICD-10-CM | POA: Diagnosis not present

## 2017-09-18 DIAGNOSIS — G894 Chronic pain syndrome: Secondary | ICD-10-CM | POA: Diagnosis not present

## 2017-09-18 DIAGNOSIS — Z8249 Family history of ischemic heart disease and other diseases of the circulatory system: Secondary | ICD-10-CM

## 2017-09-18 DIAGNOSIS — F17210 Nicotine dependence, cigarettes, uncomplicated: Secondary | ICD-10-CM | POA: Diagnosis not present

## 2017-09-18 DIAGNOSIS — Z885 Allergy status to narcotic agent status: Secondary | ICD-10-CM | POA: Diagnosis not present

## 2017-09-18 DIAGNOSIS — Z419 Encounter for procedure for purposes other than remedying health state, unspecified: Secondary | ICD-10-CM

## 2017-09-18 HISTORY — PX: TOTAL HIP ARTHROPLASTY: SHX124

## 2017-09-18 LAB — ABO/RH: ABO/RH(D): A POS

## 2017-09-18 LAB — TYPE AND SCREEN
ABO/RH(D): A POS
ANTIBODY SCREEN: NEGATIVE

## 2017-09-18 SURGERY — ARTHROPLASTY, HIP, TOTAL, ANTERIOR APPROACH
Anesthesia: General | Site: Hip | Laterality: Left

## 2017-09-18 MED ORDER — FENTANYL CITRATE (PF) 100 MCG/2ML IJ SOLN
25.0000 ug | INTRAMUSCULAR | Status: DC | PRN
  Administered 2017-09-18 (×2): 50 ug via INTRAVENOUS

## 2017-09-18 MED ORDER — MIDAZOLAM HCL 2 MG/2ML IJ SOLN
1.0000 mg | Freq: Once | INTRAMUSCULAR | Status: AC
  Administered 2017-09-18: 1 mg via INTRAVENOUS

## 2017-09-18 MED ORDER — FAMOTIDINE 20 MG PO TABS
40.0000 mg | ORAL_TABLET | Freq: Once | ORAL | Status: AC
  Administered 2017-09-18: 40 mg via ORAL

## 2017-09-18 MED ORDER — PHENYLEPHRINE 40 MCG/ML (10ML) SYRINGE FOR IV PUSH (FOR BLOOD PRESSURE SUPPORT)
PREFILLED_SYRINGE | INTRAVENOUS | Status: AC
  Filled 2017-09-18: qty 10

## 2017-09-18 MED ORDER — LIDOCAINE 2% (20 MG/ML) 5 ML SYRINGE
INTRAMUSCULAR | Status: AC
  Filled 2017-09-18: qty 5

## 2017-09-18 MED ORDER — RIVAROXABAN 10 MG PO TABS
10.0000 mg | ORAL_TABLET | Freq: Every day | ORAL | Status: DC
  Administered 2017-09-19 – 2017-09-21 (×3): 10 mg via ORAL
  Filled 2017-09-18 (×3): qty 1

## 2017-09-18 MED ORDER — DOCUSATE SODIUM 100 MG PO CAPS
100.0000 mg | ORAL_CAPSULE | Freq: Two times a day (BID) | ORAL | Status: DC
  Administered 2017-09-18 – 2017-09-19 (×3): 100 mg via ORAL
  Filled 2017-09-18 (×3): qty 1

## 2017-09-18 MED ORDER — FENTANYL CITRATE (PF) 100 MCG/2ML IJ SOLN
INTRAMUSCULAR | Status: AC
  Filled 2017-09-18: qty 2

## 2017-09-18 MED ORDER — ZOLPIDEM TARTRATE 5 MG PO TABS
5.0000 mg | ORAL_TABLET | Freq: Every evening | ORAL | Status: DC | PRN
  Administered 2017-09-18 – 2017-09-20 (×3): 5 mg via ORAL
  Filled 2017-09-18 (×3): qty 1

## 2017-09-18 MED ORDER — ROCURONIUM BROMIDE 10 MG/ML (PF) SYRINGE
PREFILLED_SYRINGE | INTRAVENOUS | Status: AC
  Filled 2017-09-18: qty 5

## 2017-09-18 MED ORDER — FENTANYL CITRATE (PF) 100 MCG/2ML IJ SOLN
INTRAMUSCULAR | Status: DC | PRN
  Administered 2017-09-18: 50 ug via INTRAVENOUS
  Administered 2017-09-18: 25 ug via INTRAVENOUS
  Administered 2017-09-18 (×2): 50 ug via INTRAVENOUS
  Administered 2017-09-18: 25 ug via INTRAVENOUS
  Administered 2017-09-18: 50 ug via INTRAVENOUS

## 2017-09-18 MED ORDER — LACTATED RINGERS IV SOLN
INTRAVENOUS | Status: DC
  Administered 2017-09-18: 13:00:00 via INTRAVENOUS

## 2017-09-18 MED ORDER — OXYCODONE HCL 5 MG/5ML PO SOLN: 5.0000 mg | Freq: Once | ORAL | Status: DC | PRN

## 2017-09-18 MED ORDER — PROPOFOL 10 MG/ML IV BOLUS
INTRAVENOUS | Status: DC | PRN
  Administered 2017-09-18: 30 mg via INTRAVENOUS
  Administered 2017-09-18: 20 mg via INTRAVENOUS
  Administered 2017-09-18: 100 mg via INTRAVENOUS

## 2017-09-18 MED ORDER — ONDANSETRON HCL 4 MG/2ML IJ SOLN
INTRAMUSCULAR | Status: DC | PRN
  Administered 2017-09-18: 4 mg via INTRAVENOUS

## 2017-09-18 MED ORDER — FENTANYL CITRATE (PF) 250 MCG/5ML IJ SOLN
INTRAMUSCULAR | Status: AC
  Filled 2017-09-18: qty 5

## 2017-09-18 MED ORDER — ACETAMINOPHEN 325 MG PO TABS: 650.0000 mg | ORAL_TABLET | ORAL | Status: DC | PRN

## 2017-09-18 MED ORDER — METOCLOPRAMIDE HCL 5 MG PO TABS: 5.0000 mg | ORAL_TABLET | Freq: Three times a day (TID) | ORAL | Status: DC | PRN

## 2017-09-18 MED ORDER — ACETAMINOPHEN 650 MG RE SUPP: 650.0000 mg | RECTAL | Status: DC | PRN

## 2017-09-18 MED ORDER — OXYCODONE HCL 5 MG PO TABS
ORAL_TABLET | ORAL | Status: AC
  Filled 2017-09-18: qty 2

## 2017-09-18 MED ORDER — PROPOFOL 10 MG/ML IV BOLUS
INTRAVENOUS | Status: AC
  Filled 2017-09-18: qty 20

## 2017-09-18 MED ORDER — SUGAMMADEX SODIUM 200 MG/2ML IV SOLN
INTRAVENOUS | Status: AC
  Filled 2017-09-18: qty 2

## 2017-09-18 MED ORDER — METOCLOPRAMIDE HCL 5 MG/ML IJ SOLN: 5.0000 mg | Freq: Three times a day (TID) | INTRAMUSCULAR | Status: DC | PRN

## 2017-09-18 MED ORDER — HYDROCODONE-ACETAMINOPHEN 7.5-325 MG PO TABS
1.0000 | ORAL_TABLET | ORAL | Status: DC | PRN
  Administered 2017-09-19: 2 via ORAL
  Filled 2017-09-18: qty 2

## 2017-09-18 MED ORDER — SUCCINYLCHOLINE CHLORIDE 200 MG/10ML IV SOSY
PREFILLED_SYRINGE | INTRAVENOUS | Status: AC
  Filled 2017-09-18: qty 10

## 2017-09-18 MED ORDER — ROCURONIUM BROMIDE 100 MG/10ML IV SOLN
INTRAVENOUS | Status: DC | PRN
  Administered 2017-09-18: 10 mg via INTRAVENOUS
  Administered 2017-09-18: 40 mg via INTRAVENOUS

## 2017-09-18 MED ORDER — ADULT MULTIVITAMIN W/MINERALS CH
1.0000 | ORAL_TABLET | Freq: Every day | ORAL | Status: DC
  Administered 2017-09-19 – 2017-09-21 (×3): 1 via ORAL
  Filled 2017-09-18 (×3): qty 1

## 2017-09-18 MED ORDER — EPHEDRINE 5 MG/ML INJ
INTRAVENOUS | Status: AC
  Filled 2017-09-18: qty 10

## 2017-09-18 MED ORDER — ZOLPIDEM TARTRATE 5 MG PO TABS
5.0000 mg | ORAL_TABLET | Freq: Every evening | ORAL | Status: DC | PRN
Start: 2017-09-18 — End: 2017-09-18

## 2017-09-18 MED ORDER — HYDROMORPHONE HCL 1 MG/ML IJ SOLN
1.0000 mg | INTRAMUSCULAR | Status: DC | PRN
  Administered 2017-09-18 – 2017-09-21 (×15): 1 mg via INTRAVENOUS
  Filled 2017-09-18 (×15): qty 1

## 2017-09-18 MED ORDER — OXYCODONE HCL 5 MG PO TABS: 5.0000 mg | ORAL_TABLET | Freq: Once | ORAL | Status: DC | PRN

## 2017-09-18 MED ORDER — ALUM & MAG HYDROXIDE-SIMETH 200-200-20 MG/5ML PO SUSP: 30.0000 mL | ORAL | Status: DC | PRN

## 2017-09-18 MED ORDER — ALIGN EXTRA STRENGTH PO CAPS: ORAL_CAPSULE | Freq: Every day | ORAL | Status: DC

## 2017-09-18 MED ORDER — DULOXETINE HCL 60 MG PO CPEP
60.0000 mg | ORAL_CAPSULE | Freq: Every day | ORAL | Status: DC
  Administered 2017-09-19 – 2017-09-21 (×3): 60 mg via ORAL
  Filled 2017-09-18 (×3): qty 1

## 2017-09-18 MED ORDER — SUGAMMADEX SODIUM 200 MG/2ML IV SOLN
INTRAVENOUS | Status: DC | PRN
  Administered 2017-09-18: 200 mg via INTRAVENOUS

## 2017-09-18 MED ORDER — ONDANSETRON HCL 4 MG PO TABS: 4.0000 mg | ORAL_TABLET | Freq: Four times a day (QID) | ORAL | Status: DC | PRN

## 2017-09-18 MED ORDER — DEXAMETHASONE SODIUM PHOSPHATE 4 MG/ML IJ SOLN
INTRAMUSCULAR | Status: DC | PRN
  Administered 2017-09-18: 8 mg via INTRAVENOUS

## 2017-09-18 MED ORDER — LACTATED RINGERS IV SOLN
INTRAVENOUS | Status: DC | PRN
  Administered 2017-09-18 (×2): via INTRAVENOUS

## 2017-09-18 MED ORDER — PHENOL 1.4 % MT LIQD: 1.0000 | OROMUCOSAL | Status: DC | PRN

## 2017-09-18 MED ORDER — DEXAMETHASONE SODIUM PHOSPHATE 10 MG/ML IJ SOLN
INTRAMUSCULAR | Status: AC
  Filled 2017-09-18: qty 1

## 2017-09-18 MED ORDER — POLYETHYLENE GLYCOL 3350 17 G PO PACK
17.0000 g | PACK | Freq: Every day | ORAL | Status: DC | PRN
  Administered 2017-09-19 – 2017-09-21 (×3): 17 g via ORAL
  Filled 2017-09-18 (×3): qty 1

## 2017-09-18 MED ORDER — SORBITOL 70 % SOLN
30.0000 mL | Freq: Every day | Status: DC | PRN
  Filled 2017-09-18: qty 30

## 2017-09-18 MED ORDER — SODIUM CHLORIDE 0.9 % IR SOLN
Status: DC | PRN
  Administered 2017-09-18: 3000 mL

## 2017-09-18 MED ORDER — SODIUM CHLORIDE 0.9 % IV SOLN
INTRAVENOUS | Status: DC
  Administered 2017-09-18 – 2017-09-21 (×5): via INTRAVENOUS

## 2017-09-18 MED ORDER — LIDOCAINE 2% (20 MG/ML) 5 ML SYRINGE
INTRAMUSCULAR | Status: DC | PRN
  Administered 2017-09-18: 60 mg via INTRAVENOUS

## 2017-09-18 MED ORDER — OXYCODONE HCL 5 MG PO TABS
10.0000 mg | ORAL_TABLET | ORAL | Status: DC | PRN
  Administered 2017-09-18: 15 mg via ORAL
  Administered 2017-09-18 – 2017-09-19 (×2): 10 mg via ORAL
  Administered 2017-09-19 (×2): 15 mg via ORAL
  Administered 2017-09-20 (×3): 10 mg via ORAL
  Administered 2017-09-20: 15 mg via ORAL
  Administered 2017-09-21: 10 mg via ORAL
  Administered 2017-09-21: 15 mg via ORAL
  Administered 2017-09-21: 10 mg via ORAL
  Filled 2017-09-18: qty 3
  Filled 2017-09-18: qty 2
  Filled 2017-09-18: qty 3
  Filled 2017-09-18 (×2): qty 2
  Filled 2017-09-18 (×5): qty 3
  Filled 2017-09-18: qty 2

## 2017-09-18 MED ORDER — SODIUM CHLORIDE 0.9 % IV SOLN: Freq: Once | INTRAVENOUS | Status: DC

## 2017-09-18 MED ORDER — DIPHENHYDRAMINE HCL 12.5 MG/5ML PO ELIX: 12.5000 mg | ORAL_SOLUTION | ORAL | Status: DC | PRN

## 2017-09-18 MED ORDER — ONDANSETRON HCL 4 MG/2ML IJ SOLN: 4.0000 mg | Freq: Once | INTRAMUSCULAR | Status: DC | PRN

## 2017-09-18 MED ORDER — MIDAZOLAM HCL 2 MG/2ML IJ SOLN
INTRAMUSCULAR | Status: AC
  Filled 2017-09-18: qty 2

## 2017-09-18 MED ORDER — PHENYLEPHRINE 40 MCG/ML (10ML) SYRINGE FOR IV PUSH (FOR BLOOD PRESSURE SUPPORT)
PREFILLED_SYRINGE | INTRAVENOUS | Status: DC | PRN
  Administered 2017-09-18 (×6): 80 ug via INTRAVENOUS

## 2017-09-18 MED ORDER — FAMOTIDINE 20 MG PO TABS
ORAL_TABLET | ORAL | Status: AC
  Administered 2017-09-18: 40 mg via ORAL
  Filled 2017-09-18: qty 2

## 2017-09-18 MED ORDER — ONDANSETRON HCL 4 MG/2ML IJ SOLN
INTRAMUSCULAR | Status: AC
  Filled 2017-09-18: qty 2

## 2017-09-18 MED ORDER — 0.9 % SODIUM CHLORIDE (POUR BTL) OPTIME
TOPICAL | Status: DC | PRN
  Administered 2017-09-18: 1000 mL

## 2017-09-18 MED ORDER — ONDANSETRON HCL 4 MG/2ML IJ SOLN: 4.0000 mg | Freq: Four times a day (QID) | INTRAMUSCULAR | Status: DC | PRN

## 2017-09-18 MED ORDER — DICYCLOMINE HCL 20 MG PO TABS: 20.0000 mg | ORAL_TABLET | Freq: Four times a day (QID) | ORAL | Status: DC | PRN

## 2017-09-18 MED ORDER — MENTHOL 3 MG MT LOZG: 1.0000 | LOZENGE | OROMUCOSAL | Status: DC | PRN

## 2017-09-18 MED ORDER — CHLORHEXIDINE GLUCONATE 4 % EX LIQD: 60.0000 mL | Freq: Once | CUTANEOUS | Status: DC

## 2017-09-18 MED ORDER — CEFAZOLIN SODIUM-DEXTROSE 1-4 GM/50ML-% IV SOLN
1.0000 g | Freq: Four times a day (QID) | INTRAVENOUS | Status: AC
  Administered 2017-09-18 – 2017-09-19 (×2): 1 g via INTRAVENOUS
  Filled 2017-09-18 (×2): qty 50

## 2017-09-18 SURGICAL SUPPLY — 58 items
BENZOIN TINCTURE PRP APPL 2/3 (GAUZE/BANDAGES/DRESSINGS) ×3 IMPLANT
BLADE CLIPPER SURG (BLADE) IMPLANT
BLADE SAW SGTL 18X1.27X75 (BLADE) ×2 IMPLANT
BLADE SAW SGTL 18X1.27X75MM (BLADE) ×1
CAPT HIP TOTAL 2 ×3 IMPLANT
CELLS DAT CNTRL 66122 CELL SVR (MISCELLANEOUS) ×1 IMPLANT
CLOSURE WOUND 1/2 X4 (GAUZE/BANDAGES/DRESSINGS) ×2
COVER SURGICAL LIGHT HANDLE (MISCELLANEOUS) ×3 IMPLANT
DRAPE C-ARM 42X72 X-RAY (DRAPES) ×3 IMPLANT
DRAPE HALF SHEET 40X57 (DRAPES) ×3 IMPLANT
DRAPE STERI IOBAN 125X83 (DRAPES) ×3 IMPLANT
DRAPE U-SHAPE 47X51 STRL (DRAPES) ×9 IMPLANT
DRSG AQUACEL AG ADV 3.5X10 (GAUZE/BANDAGES/DRESSINGS) ×3 IMPLANT
DURAPREP 26ML APPLICATOR (WOUND CARE) ×3 IMPLANT
ELECT BLADE 4.0 EZ CLEAN MEGAD (MISCELLANEOUS) ×3
ELECT BLADE 6.5 EXT (BLADE) ×3 IMPLANT
ELECT REM PT RETURN 9FT ADLT (ELECTROSURGICAL) ×3
ELECTRODE BLDE 4.0 EZ CLN MEGD (MISCELLANEOUS) ×1 IMPLANT
ELECTRODE REM PT RTRN 9FT ADLT (ELECTROSURGICAL) ×1 IMPLANT
FACESHIELD WRAPAROUND (MASK) ×9 IMPLANT
GLOVE BIOGEL PI IND STRL 7.0 (GLOVE) ×2 IMPLANT
GLOVE BIOGEL PI IND STRL 8 (GLOVE) ×2 IMPLANT
GLOVE BIOGEL PI INDICATOR 7.0 (GLOVE) ×4
GLOVE BIOGEL PI INDICATOR 8 (GLOVE) ×4
GLOVE ECLIPSE 8.0 STRL XLNG CF (GLOVE) ×3 IMPLANT
GLOVE ORTHO TXT STRL SZ7.5 (GLOVE) ×6 IMPLANT
GLOVE SURG ORTHO 8.0 STRL STRW (GLOVE) ×3 IMPLANT
GLOVE SURG SS PI 7.0 STRL IVOR (GLOVE) ×6 IMPLANT
GOWN STRL REUS W/ TWL LRG LVL3 (GOWN DISPOSABLE) ×2 IMPLANT
GOWN STRL REUS W/ TWL XL LVL3 (GOWN DISPOSABLE) ×2 IMPLANT
GOWN STRL REUS W/TWL LRG LVL3 (GOWN DISPOSABLE) ×4
GOWN STRL REUS W/TWL XL LVL3 (GOWN DISPOSABLE) ×4
HANDPIECE INTERPULSE COAX TIP (DISPOSABLE) ×2
KIT BASIN OR (CUSTOM PROCEDURE TRAY) ×3 IMPLANT
KIT ROOM TURNOVER OR (KITS) ×3 IMPLANT
MANIFOLD NEPTUNE II (INSTRUMENTS) ×3 IMPLANT
NS IRRIG 1000ML POUR BTL (IV SOLUTION) ×3 IMPLANT
PACK TOTAL JOINT (CUSTOM PROCEDURE TRAY) ×3 IMPLANT
PAD ARMBOARD 7.5X6 YLW CONV (MISCELLANEOUS) ×3 IMPLANT
RTRCTR WOUND ALEXIS 18CM MED (MISCELLANEOUS) ×3
SET HNDPC FAN SPRY TIP SCT (DISPOSABLE) ×1 IMPLANT
STAPLER VISISTAT 35W (STAPLE) IMPLANT
STRIP CLOSURE SKIN 1/2X4 (GAUZE/BANDAGES/DRESSINGS) ×4 IMPLANT
SUT ETHIBOND NAB CT1 #1 30IN (SUTURE) ×6 IMPLANT
SUT MNCRL AB 3-0 PS2 18 (SUTURE) ×3 IMPLANT
SUT MNCRL AB 4-0 PS2 18 (SUTURE) IMPLANT
SUT VIC AB 0 CT1 27 (SUTURE) ×2
SUT VIC AB 0 CT1 27XBRD ANBCTR (SUTURE) ×1 IMPLANT
SUT VIC AB 1 CT1 27 (SUTURE) ×4
SUT VIC AB 1 CT1 27XBRD ANBCTR (SUTURE) ×2 IMPLANT
SUT VIC AB 2-0 CT1 27 (SUTURE) ×4
SUT VIC AB 2-0 CT1 TAPERPNT 27 (SUTURE) ×2 IMPLANT
TOWEL OR 17X24 6PK STRL BLUE (TOWEL DISPOSABLE) ×3 IMPLANT
TOWEL OR 17X26 10 PK STRL BLUE (TOWEL DISPOSABLE) ×3 IMPLANT
TRAY CATH 16FR W/PLASTIC CATH (SET/KITS/TRAYS/PACK) IMPLANT
TRAY FOLEY W/METER SILVER 16FR (SET/KITS/TRAYS/PACK) ×3 IMPLANT
WATER STERILE IRR 1000ML POUR (IV SOLUTION) IMPLANT
YANKAUER SUCT BULB TIP NO VENT (SUCTIONS) ×3 IMPLANT

## 2017-09-18 NOTE — Anesthesia Postprocedure Evaluation (Signed)
Anesthesia Post Note  Patient: RAYNISHA AVILLA  Procedure(s) Performed: LEFT TOTAL HIP ARTHROPLASTY ANTERIOR APPROACH (Left Hip)     Patient location during evaluation: PACU Anesthesia Type: General Level of consciousness: awake, awake and alert and oriented Pain management: pain level controlled Vital Signs Assessment: post-procedure vital signs reviewed and stable Respiratory status: spontaneous breathing, nonlabored ventilation and respiratory function stable Cardiovascular status: blood pressure returned to baseline Anesthetic complications: no    Last Vitals:  Vitals:   09/18/17 1848 09/18/17 1855  BP: (!) 117/44 (!) 117/44  Pulse: 82 82  Resp: 14   Temp: (!) 36.4 C 36.6 C  SpO2: 93% 99%    Last Pain:  Vitals:   09/18/17 1855  TempSrc: Oral  PainSc:                  [employer] COKER

## 2017-09-18 NOTE — Anesthesia Procedure Notes (Signed)
Procedure Name: Intubation Date/Time: 09/18/2017 3:19 PM Performed by: Orlie Dakin, CRNA Pre-anesthesia Checklist: Patient identified, Emergency Drugs available, Suction available, Patient being monitored and Timeout performed Patient Re-evaluated:Patient Re-evaluated prior to induction Oxygen Delivery Method: Circle system utilized Preoxygenation: Pre-oxygenation with 100% oxygen Induction Type: IV induction Ventilation: Mask ventilation without difficulty Laryngoscope Size: Miller and 3 Grade View: Grade I Tube type: Oral Tube size: 7.0 mm Number of attempts: 1 Airway Equipment and Method: Stylet Placement Confirmation: ETT inserted through vocal cords under direct vision,  positive ETCO2 and breath sounds checked- equal and bilateral Secured at: 21 cm Tube secured with: Tape Dental Injury: Teeth and Oropharynx as per pre-operative assessment

## 2017-09-18 NOTE — Brief Op Note (Signed)
09/18/2017  4:53 PM  PATIENT:  Brittany Mays  76 y.o. female  PRE-OPERATIVE DIAGNOSIS:  osteoarthritis left hip  POST-OPERATIVE DIAGNOSIS:  osteoarthritis left hip  PROCEDURE:  Procedure(s): LEFT TOTAL HIP ARTHROPLASTY ANTERIOR APPROACH (Left)  SURGEON:  Surgeon(s) and Role:    Mcarthur Rossetti, MD - Primary  ANESTHESIA:   general  EBL:  200 mL   COUNTS:  YES  DICTATION: .Other Dictation: Dictation Number [id number]  PLAN OF CARE: Admit to inpatient   PATIENT DISPOSITION:  PACU - hemodynamically stable.   Delay start of Pharmacological VTE agent (>24hrs) due to surgical blood loss or risk of bleeding: no

## 2017-09-18 NOTE — H&P (Signed)
TOTAL HIP ADMISSION H&P  Patient is admitted for left total hip arthroplasty.  Subjective:  Chief Complaint: left hip pain  HPI: Brittany Mays, 76 y.o. female, has a history of pain and functional disability in the left hip(s) due to arthritis and patient has failed non-surgical conservative treatments for greater than 12 weeks to include NSAID's and/or analgesics, corticosteriod injections, flexibility and strengthening excercises, supervised PT with diminished ADL's post treatment, use of assistive devices and activity modification.  Onset of symptoms was gradual starting 4 years ago with gradually worsening course since that time.The patient noted no past surgery on the left hip(s).  Patient currently rates pain in the left hip at 10 out of 10 with activity. Patient has night pain, worsening of pain with activity and weight bearing, trendelenberg gait, pain that interfers with activities of daily living and pain with passive range of motion. Patient has evidence of subchondral cysts, subchondral sclerosis and periarticular osteophytes by imaging studies. This condition presents safety issues increasing the risk of falls.  There is no current active infection.  Patient Active Problem List   Diagnosis Date Noted  . 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 Osteoarthritis of ankle or foot 05/25/2017  . Trochanteric bursitis of left hip 05/25/2017  . Osteoarthritis of hip (Left) 04/25/2017  . Acute postoperative pain 02/14/2017  . Musculoskeletal pain 01/23/2017  . Tremor 01/10/2017  . Chronic pain syndrome 11/13/2016  . Chronic upper back pain (midline) 09/13/2016  . Lumbar spondylosis 08/15/2016  . Lumbar facet syndrome (Bilateral) (R>L) 07/19/2016  . Chronic groin pain (Location of Tertiary source of pain) (Left) 07/13/2016  . 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 Opioid-induced constipation (OIC) 07/12/2016  . Osteoarthritis, multiple sites 07/11/2016  .  Chronic low back pain (Location of Primary Source of Pain) (Bilateral) (R>L) 07/11/2016  . 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 Chronic knee pain (Bilateral) (L>R) 07/11/2016  . Long term current use of opiate analgesic 07/11/2016  . Long term prescription opiate use 07/11/2016  . Opiate use (60 MME/Day) 07/11/2016  . Encounter for pain management planning 07/11/2016  . Encounter for therapeutic drug level monitoring 07/11/2016  . Abnormal CT scan, lumbar spine (05/07/2015) 07/11/2016  . Osteoarthritis of sacroiliac joint (Bilateral) 07/11/2016  . Lumbar facet arthropathy ([place]) 07/11/2016  . Lumbar spondylolisthesis 07/11/2016  . Lumbar foraminal stenosis (Bilateral) 07/11/2016  . History of total bilateral knee replacement 07/11/2016  . Aortic aneurysm ([place]) 06/06/2016  . Hyperlipidemia 03/06/2016  . Chronic hip pain (Location of Secondary source of pain) (Left) 12/06/2015  . Solitary pulmonary nodule 09/10/2015  . Mechanical complication of internal fixation device such as nail, plate or rod ([place]) 28/31/5176  . 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 Pseudarthrosis following spinal fusion 07/29/2015  . Generalized weakness 06/02/2014  . History of lumbar fusion (T10 through L3 fusion) 04/28/2014  . Obesity (BMI 30-39.9) 06/23/2013  . Anxiety and depression   . Lumbar spinal stenosis    Past Medical History:  Diagnosis Date  . Anxiety   . Aortic aneurysm ([place]) 06/06/2016  . Cataract   . Chest pain 08/18/2015  . Complication of anesthesia    as a teenager pt. had surgery and surgeon told parents that she should never have pentathal ever again  . Constipation due to opioid therapy   . Decreased muscle strength 02/10/2014  . Depression   . Difficulty in walking(719.7) 02/10/2014  . Dysphagia 08/19/2015  . Dyspnea 08/19/2015  .  GERD (gastroesophageal reflux disease)    occasionally    tums OTC  . History of kidney stones   . Hyperparathyroidism, primary  ([place])    s/p parathyroidectomy  . Hyperventilation 08/18/2015  . Insomnia   . Intractable back pain 04/29/2014  . Kidney stone   . Lumbar spinal stenosis    s/p laminectomy  . Odynophagia 08/18/2015  . Osteoarthritis of both knees    s/p knee replacements  . Pain   . Rectal prolapse    s/p repair  . Unintentional weight loss 09/08/2015  . UTI (lower urinary tract infection) 04/28/2014    Past Surgical History:  Procedure Laterality Date  . ABDOMINAL HYSTERECTOMY    . APPENDECTOMY    . BACK SURGERY  07-2015    total 4 major back surgeries  . BREAST LUMPECTOMY     Reports this is benign  . colonoscopy  2013  . JOINT REPLACEMENT Bilateral    knee replacements  . KNEE SURGERY Bilateral 1990's nd 2002   Knee Replacements  . PARATHYROIDECTOMY  1990's  . SPINE SURGERY    . TONSILLECTOMY      Current Facility-Administered Medications  Medication Dose Route Frequency Provider Last Rate Last Dose  . ceFAZolin (ANCEF) IVPB 2g/100 mL premix  2 g Intravenous To SS-Surg Mcarthur Rossetti, MD      . chlorhexidine (HIBICLENS) 4 % liquid 4 application  60 mL Topical Once Erskine Emery W, PA-C      . lactated ringers infusion   Intravenous Continuous Rica Koyanagi, MD 10 mL/hr at 09/18/17 1239    . tranexamic acid (CYKLOKAPRON) 1,000 mg in sodium chloride 0.9 % 100 mL IVPB  1,000 mg Intravenous To OR Mcarthur Rossetti, MD       Allergies  Allergen Reactions  . Fentanyl Itching, Nausea And Vomiting, Rash and Other (See Comments)    Sweating, duragesic patch only; can tolerate IV, Itching.  . Nsaids Diarrhea, Nausea And Vomiting, Rash and Other (See Comments)    Abdominal cramping. "horrible cramps".   . Pentothal [Thiopental] Nausea And Vomiting, Rash and Other (See Comments)    UNSPECIFIED OTHER REACTION > Had as a child, and was told to never take again     Social History   Tobacco Use  . Smoking status: Current Every Day Smoker    Packs/day: 0.50    Years: 50.00     Pack years: 25.00    Types: Cigarettes  . Smokeless tobacco: Never Used  Substance Use Topics  . Alcohol use: No    Alcohol/week: 0.0 oz    Family History  Problem Relation Age of Onset  . Mental illness Mother   . COPD Father   . Heart failure Father   . Diabetes Son      Review of Systems  Musculoskeletal: Positive for joint pain.  All other systems reviewed and are negative.   Objective:  Physical Exam  Constitutional: She is oriented to person, place, and time. She appears well-developed and well-nourished.  HENT:  Head: Normocephalic and atraumatic.  Eyes: EOM are normal. Pupils are equal, round, and reactive to light.  Neck: Normal range of motion. Neck supple.  Cardiovascular: Normal rate and regular rhythm.  Respiratory: Effort normal and breath sounds normal.  GI: Soft. Bowel sounds are normal.  Musculoskeletal:       Left hip: She exhibits decreased range of motion, decreased strength, tenderness and bony tenderness.  Neurological: She is alert and oriented to person, place, and time.  Skin: Skin is warm.  Psychiatric: She has a normal mood and affect.    Vital signs in last 24 hours: Temp:  [98 F (36.7 C)] 98 F (36.7 C) (11/13 1225) Pulse Rate:  [105] 105 (11/13 1225) Resp:  [18] 18 (11/13 1225) BP: (149)/(76) 149/76 (11/13 1225) SpO2:  [96 %] 96 % (11/13 1225) Weight:  [117 lb 6.4 oz (53.3 kg)] 117 lb 6.4 oz (53.3 kg) (11/13 1225)  Labs:   Estimated body mass index is 24.54 kg/m as calculated from the following:   Height as of this encounter: 4\' 10"  (1.473 m).   Weight as of this encounter: 117 lb 6.4 oz (53.3 kg).   Imaging Review Plain radiographs demonstrate severe degenerative joint disease of the left hip(s). The bone quality appears to be good for age and reported activity level.  Assessment/Plan:  End stage arthritis, left hip(s)  The patient history, physical examination, clinical judgement of the provider and imaging studies  are consistent with end stage degenerative joint disease of the left hip(s) and total hip arthroplasty is deemed medically necessary. The treatment options including medical management, injection therapy, arthroscopy and arthroplasty were discussed at length. The risks and benefits of total hip arthroplasty were presented and reviewed. The risks due to aseptic loosening, infection, stiffness, dislocation/subluxation,  thromboembolic complications and other imponderables were discussed.  The patient acknowledged the explanation, agreed to proceed with the plan and consent was signed. Patient is being admitted for inpatient treatment for surgery, pain control, PT, OT, prophylactic antibiotics, VTE prophylaxis, progressive ambulation and ADL's and discharge planning.The patient is planning to be discharged to skilled nursing.

## 2017-09-18 NOTE — Transfer of Care (Signed)
Immediate Anesthesia Transfer of Care Note  Patient: Brittany Mays  Procedure(s) Performed: LEFT TOTAL HIP ARTHROPLASTY ANTERIOR APPROACH (Left Hip)  Patient Location: PACU  Anesthesia Type:General  Level of Consciousness: awake, oriented and patient cooperative  Airway & Oxygen Therapy: Patient Spontanous Breathing and Patient connected to face mask oxygen  Post-op Assessment: Report given to RN and Post -op Vital signs reviewed and stable  Post vital signs: Reviewed and stable  Last Vitals:  Vitals:   09/18/17 1225 09/18/17 1713  BP: (!) 149/76 110/86  Pulse: (!) 105 96  Resp: 18 18  Temp: 36.7 C (!) 36.1 C  SpO2: 96% 100%    Last Pain:  Vitals:   09/18/17 1713  TempSrc:   PainSc: 10-Worst pain ever      Patients Stated Pain Goal: 4 (92/42/68 3419)  Complications: No apparent anesthesia complications

## 2017-09-18 NOTE — Anesthesia Preprocedure Evaluation (Addendum)
Anesthesia Evaluation  Patient identified by MRN, date of birth, ID band Patient awake    Reviewed: Allergy & Precautions, NPO status , Patient's Chart, lab work & pertinent test results  Airway Mallampati: II  TM Distance: >3 FB     Dental  (+) Edentulous Upper, Edentulous Lower   Pulmonary shortness of breath, Current Smoker,    breath sounds clear to auscultation       Cardiovascular + Peripheral Vascular Disease   Rhythm:Regular Rate:Normal     Neuro/Psych    GI/Hepatic GERD  ,  Endo/Other  negative endocrine ROS  Renal/GU Renal InsufficiencyRenal disease     Musculoskeletal  (+) Arthritis , Back pain , back surgery , and fusion   Abdominal   Peds  Hematology negative hematology ROS (+)   Anesthesia Other Findings   Reproductive/Obstetrics                           Anesthesia Physical Anesthesia Plan  ASA: III  Anesthesia Plan: General   Post-op Pain Management:    Induction: Intravenous  PONV Risk Score and Plan: 3 and Ondansetron, Dexamethasone and Treatment may vary due to age or medical condition  Airway Management Planned: Oral ETT  Additional Equipment:   Intra-op Plan:   Post-operative Plan: Extubation in OR  Informed Consent: I have reviewed the patients History and Physical, chart, labs and discussed the procedure including the risks, benefits and alternatives for the proposed anesthesia with the patient or authorized representative who has indicated his/her understanding and acceptance.   Dental advisory given  Plan Discussed with: CRNA  Anesthesia Plan Comments:        Anesthesia Quick Evaluation

## 2017-09-19 ENCOUNTER — Encounter (HOSPITAL_COMMUNITY): Payer: Self-pay | Admitting: General Practice

## 2017-09-19 LAB — BASIC METABOLIC PANEL
ANION GAP: 5 (ref 5–15)
BUN: 8 mg/dL (ref 6–20)
CALCIUM: 8.2 mg/dL — AB (ref 8.9–10.3)
CHLORIDE: 102 mmol/L (ref 101–111)
CO2: 28 mmol/L (ref 22–32)
Creatinine, Ser: 0.77 mg/dL (ref 0.44–1.00)
GFR calc Af Amer: >60 mL/min (ref >60)
GFR calc non Af Amer: >60 mL/min (ref >60)
GLUCOSE: 113 mg/dL — AB (ref 65–99)
POTASSIUM: 3.8 mmol/L (ref 3.5–5.1)
Sodium: 135 mmol/L (ref 135–145)

## 2017-09-19 LAB — CBC
HEMATOCRIT: 28.5 % — AB (ref 36.0–46.0)
HEMOGLOBIN: 9.7 g/dL — AB (ref 12.0–15.0)
MCH: 31.9 pg (ref 26.0–34.0)
MCHC: 34 g/dL (ref 30.0–36.0)
MCV: 93.8 fL (ref 78.0–100.0)
Platelets: 125 10*3/uL — ABNORMAL LOW (ref 150–400)
RBC: 3.04 MIL/uL — ABNORMAL LOW (ref 3.87–5.11)
RDW: 13.7 % (ref 11.5–15.5)
WBC: 8.8 10*3/uL (ref 4.0–10.5)

## 2017-09-19 MED ORDER — DOCUSATE SODIUM 100 MG PO CAPS
200.0000 mg | ORAL_CAPSULE | Freq: Every day | ORAL | Status: DC
  Administered 2017-09-20 – 2017-09-21 (×2): 200 mg via ORAL
  Filled 2017-09-19 (×2): qty 2

## 2017-09-19 NOTE — Progress Notes (Signed)
Subjective: 1 Day Post-Op Procedure(s) (LRB): LEFT TOTAL HIP ARTHROPLASTY ANTERIOR APPROACH (Left) Patient reports pain as moderate.  Acute blood loss anemia from surgery - will monitor.  Objective: Vital signs in last 24 hours: Temp:  [97 F (36.1 C)-99.2 F (37.3 C)] 99 F (37.2 C) (11/14 0508) Pulse Rate:  [64-105] 86 (11/14 0508) Resp:  [13-48] 16 (11/14 0508) BP: (81-149)/(44-92) 99/44 (11/14 0508) SpO2:  [93 %-100 %] 96 % (11/14 0508) Weight:  [117 lb 6.4 oz (53.3 kg)] 117 lb 6.4 oz (53.3 kg) (11/13 1225)  Intake/Output from previous day: 11/13 0701 - 11/14 0700 In: 2360 [P.O.:360; I.V.:2000] Out: 2050 [Urine:1850; Blood:200] Intake/Output this shift: Total I/O In: 360 [P.O.:360] Out: 1700 [Urine:1700]  Recent Labs    09/19/17 0544  HGB 9.7*   Recent Labs    09/19/17 0544  WBC 8.8  RBC 3.04*  HCT 28.5*  PLT 125*   Recent Labs    09/19/17 0544  NA 135  K 3.8  CL 102  CO2 28  BUN 8  CREATININE 0.77  GLUCOSE 113*  CALCIUM 8.2*   No results for input(s): LABPT, INR in the last 72 hours.  Sensation intact distally Intact pulses distally Dorsiflexion/Plantar flexion intact Incision: scant drainage  Assessment/Plan: 1 Day Post-Op Procedure(s) (LRB): LEFT TOTAL HIP ARTHROPLASTY ANTERIOR APPROACH (Left) Up with therapy  Mcarthur Rossetti 09/19/2017, 6:53 AM

## 2017-09-19 NOTE — Op Note (Signed)
NAME:  Brittany Mays, Brittany Mays                     ACCOUNT NO.:  MEDICAL RECORD NO.:  [id number]  LOCATION:                                 FACILITY:  PHYSICIAN:  Lind Guest. Ninfa Linden, M.D.DATE OF BIRTH:  August 15, 1941  DATE OF PROCEDURE:  09/18/2017 DATE OF DISCHARGE:                              OPERATIVE REPORT   PREOPERATIVE DIAGNOSIS:  Primary osteoarthritis and degenerative joint disease, left hip.  POSTOPERATIVE DIAGNOSIS:  Primary osteoarthritis and degenerative joint disease, left hip.  PROCEDURE:  Left total hip arthroplasty through direct anterior approach.  IMPLANTS:  DePuy Sector Gription acetabular component size 52, size 36+ 0 neutral polyethylene liner, size 11 Corail femoral component with standard offset, size 36 +1.5 ceramic hip ball.  SURGEON:  Lind Guest. Ninfa Linden, M.D.  ASSISTANT:  Erskine Emery, PA-C.  ANESTHESIA:  General.  ANTIBIOTICS:  2 g of IV Ancef.  BLOOD LOSS:  200 mL.  COMPLICATIONS:  None.  INDICATIONS:  Brittany Mays is a very pleasant 76 year old female with debilitating arthritis involving her left hip, this has been well documented.  She has a history of bilateral knee replacements as well. She has had extensive back surgery.  At this point, she has tried and failed all forms of conservative treatment for her left hip and does wish to proceed with surgery.  She understands fully the risks of acute blood loss anemia, nerve and vessel injury, fracture, infection, dislocation, and DVT.  She understands our goals are to decrease pain, improve mobility and overall improved quality of life.  PROCEDURE DESCRIPTION:  After informed consent was obtained, appropriate left hip was marked.  She was brought to the operating room, general anesthesia was obtained while she was on her stretcher.  A Foley catheter was placed.  Then, both feet had traction boots applied to them.  Next, she was placed supine on the Hana fracture table with the perineal  post in place and both legs in inline skeletal traction devices, but no traction applied.  Her left operative hip was prepped and draped with DuraPrep and sterile drapes.  A time-out was called. She was identified as correct patient and correct left hip.  I then made an incision just inferior and posterior to the anterior superior iliac spine and carried this obliquely down the leg.  We dissected down the tensor fascia lata muscle.  The tensor fascia was then divided longitudinally to proceed with a direct anterior approach to the hip. We identified and cauterized the circumflex vessels and then identified the hip capsule.  I opened up the hip capsule in an L-type format, finding a large joint effusion and significant arthritis surround her femoral neck and head.  We placed Cobra retractors around the medial and lateral femoral neck and then made our femoral neck cut with an oscillating saw and completed this with an osteotome.  I then placed a corkscrew guide in the femoral head and removed the femoral head in its entirety and found a large area of devoid of cartilage.  I then placed a bent Hohmann over the medial acetabular rim and cleaned the remnants of the acetabular labrum and other debris.  I then began  reaming under direct visualization from a size 42 reamer in stepwise increments up to a size 52 with all reamers under direct visualization and the last 2 reamers under direct fluoroscopy, so we could obtain our depth of reaming, our inclination, and anteversion.  Once I was pleased with this, I placed the real DePuy Sector Gription acetabular component size 52 and a 36 +0 polyethylene liner for that size acetabular component. Attention was then turned to the femur.  With the leg externally rotated to 120 degrees extended and adducted, we were able to place a Mueller retractor medially and a Hohmann retractor behind the greater trochanter.  We released the lateral joint capsule and  used a box cutting osteotome to enter the femoral canal and a rongeur to lateralize.  We then began broaching using size 8 broach using the Corail broaching system going up to size 11.  With the size 11 in place, we trialed a standard offset femoral neck and a 36+ 1.5 hip ball.  We brought the leg back over and up with traction and internal rotation reducing the pelvis.  We were pleased with leg length, offset and stability with good range of motion.  We then dislocated the hip and removed the trial components.  We were able to place the real Corail femoral component with standard offset size 11 and the real 36+ 1.5 ceramic hip ball.  Again, we reduced this in the acetabulum and verified its placement under direct fluoroscopy.  We then irrigated the soft tissue with normal saline solution using pulsatile lavage.  We were able to close the joint capsule with interrupted #1 Ethibond suture followed by running #1 Vicryl in the tensor fascia, 0 Vicryl in the deep tissue, 2-0 Vicryl in the subcutaneous tissue, 4-0 Monocryl subcuticular stitch and Steri-Strips on the skin.  An Aquacel dressing was applied.  She was taken off the Hana table, awakened, extubated, and taken to the recovery room in stable condition.  All final counts were correct.  There were no complications noted.     Lind Guest. Ninfa Linden, M.D.     CYB/MEDQ  D:  09/18/2017  T:  09/19/2017  Job:  [id number]

## 2017-09-19 NOTE — [place] FL2 (Signed)
[place] MEDICAID FL2 LEVEL OF CARE SCREENING TOOL     IDENTIFICATION  Patient Name: Brittany Mays Birthdate: 11-06-1941 Sex: female Admission Date (Current Location): 09/18/2017  [facility] and [place] Number:  [employer] and Address:  [facility]. [facility], [place] [address], [place], [place] [address]      Provider Number: [id number]  Attending Physician Name and Address:  Mcarthur Rossetti  Relative Name and Phone Number:       Current Level of Care: Hospital Recommended Level of Care: [place] Prior Approval Number:    Date Approved/Denied:   PASRR Number: [id number]  Discharge Plan: SNF    Current Diagnoses: Patient Active Problem List   Diagnosis Date Noted  . Status post total replacement of left hip 09/18/2017  . 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 Osteoarthritis of ankle or foot 05/25/2017  . Trochanteric bursitis of left hip 05/25/2017  . Osteoarthritis of hip (Left) 04/25/2017  . Acute postoperative pain 02/14/2017  . Musculoskeletal pain 01/23/2017  . Tremor 01/10/2017  . Chronic pain syndrome 11/13/2016  . Chronic upper back pain (midline) 09/13/2016  . Lumbar spondylosis 08/15/2016  . Lumbar facet syndrome (Bilateral) (R>L) 07/19/2016  . Chronic groin pain (Location of Tertiary source of pain) (Left) 07/13/2016  . 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 Opioid-induced constipation (OIC) 07/12/2016  . Osteoarthritis, multiple sites 07/11/2016  . Chronic low back pain (Location of Primary Source of Pain) (Bilateral) (R>L) 07/11/2016  . 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 Chronic knee pain (Bilateral) (L>R) 07/11/2016  . Long term current use of opiate analgesic 07/11/2016  . Long term prescription opiate use 07/11/2016  . Opiate use (60 MME/Day) 07/11/2016  . Encounter for pain management planning 07/11/2016  . Encounter for therapeutic drug level  monitoring 07/11/2016  . Abnormal CT scan, lumbar spine (05/07/2015) 07/11/2016  . Osteoarthritis of sacroiliac joint (Bilateral) 07/11/2016  . Lumbar facet arthropathy ([place]) 07/11/2016  . Lumbar spondylolisthesis 07/11/2016  . Lumbar foraminal stenosis (Bilateral) 07/11/2016  . History of total bilateral knee replacement 07/11/2016  . Aortic aneurysm ([place]) 06/06/2016  . Hyperlipidemia 03/06/2016  . Chronic hip pain (Location of Secondary source of pain) (Left) 12/06/2015  . Solitary pulmonary nodule 09/10/2015  . Mechanical complication of internal fixation device such as nail, plate or rod ([place]) 78/24/2353  . 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 Pseudarthrosis following spinal fusion 07/29/2015  . Generalized weakness 06/02/2014  . History of lumbar fusion (T10 through L3 fusion) 04/28/2014  . Obesity (BMI 30-39.9) 06/23/2013  . Anxiety and depression   . Lumbar spinal stenosis     Orientation RESPIRATION BLADDER Height & Weight     Self, Time, Situation, Place  Normal Continent Weight: 117 lb 6.4 oz (53.3 kg) Height:  4\' 10"  (147.3 cm)  BEHAVIORAL SYMPTOMS/MOOD NEUROLOGICAL BOWEL NUTRITION STATUS      Continent Diet(heart healthy)  AMBULATORY STATUS COMMUNICATION OF NEEDS Skin   Limited Assist Verbally Surgical wounds(closed incision, left hip 09/18/17; hydrocolloid dressing)                       Personal Care Assistance Level of Assistance  Bathing, Dressing, Feeding Bathing Assistance: Limited assistance Feeding assistance: Independent Dressing Assistance: Limited assistance     Functional Limitations Info  Sight, Hearing, Speech Sight Info: Adequate Hearing Info: Adequate Speech Info: Adequate    SPECIAL CARE FACTORS FREQUENCY  PT (By licensed  PT), OT (By licensed OT)     PT Frequency: 5x/wk OT Frequency: 5x/wk            Contractures Contractures Info: Not present    Additional Factors Info  Code Status, Allergies,  Psychotropic Code Status Info: Full Allergies Info: Fentanyl, Nsaids, Pentothal Thiopental Psychotropic Info: Cymbalta 60 mg daily         Current Medications (09/19/2017):  This is the current hospital active medication list Current Facility-Administered Medications  Medication Dose Route Frequency Provider Last Rate Last Dose  . 0.9 %  sodium chloride infusion   Intravenous Continuous Mcarthur Rossetti, MD 75 mL/hr at 09/19/17 0241    . acetaminophen (TYLENOL) tablet 650 mg  650 mg Oral Q4H PRN Mcarthur Rossetti, MD       Or  . acetaminophen (TYLENOL) suppository 650 mg  650 mg Rectal Q4H PRN Mcarthur Rossetti, MD      . alum & mag hydroxide-simeth (MAALOX/MYLANTA) 200-200-20 MG/5ML suspension 30 mL  30 mL Oral Q4H PRN Mcarthur Rossetti, MD      . dicyclomine (BENTYL) tablet 20 mg  20 mg Oral Q6H PRN Mcarthur Rossetti, MD      . diphenhydrAMINE (BENADRYL) 12.5 MG/5ML elixir 12.5-25 mg  12.5-25 mg Oral Q4H PRN Mcarthur Rossetti, MD      . docusate sodium (COLACE) capsule 100 mg  100 mg Oral BID Mcarthur Rossetti, MD   100 mg at 09/19/17 0944  . DULoxetine (CYMBALTA) DR capsule 60 mg  60 mg Oral Daily Mcarthur Rossetti, MD   60 mg at 09/19/17 0944  . HYDROcodone-acetaminophen (NORCO) 7.5-325 MG per tablet 1-2 tablet  1-2 tablet Oral Q4H PRN Mcarthur Rossetti, MD   2 tablet at 09/19/17 0805  . HYDROmorphone (DILAUDID) injection 1 mg  1 mg Intravenous Q2H PRN Mcarthur Rossetti, MD   1 mg at 09/19/17 5400  . menthol-cetylpyridinium (CEPACOL) lozenge 3 mg  1 lozenge Oral PRN Mcarthur Rossetti, MD       Or  . phenol (CHLORASEPTIC) mouth spray 1 spray  1 spray Mouth/Throat PRN Mcarthur Rossetti, MD      . metoCLOPramide (REGLAN) tablet 5-10 mg  5-10 mg Oral Q8H PRN Mcarthur Rossetti, MD       Or  . metoCLOPramide (REGLAN) injection 5-10 mg  5-10 mg Intravenous Q8H PRN Mcarthur Rossetti, MD      . multivitamin  with minerals tablet 1 tablet  1 tablet Oral Daily Mcarthur Rossetti, MD   1 tablet at 09/19/17 0944  . ondansetron (ZOFRAN) tablet 4 mg  4 mg Oral Q6H PRN Mcarthur Rossetti, MD       Or  . ondansetron [facility]) injection 4 mg  4 mg Intravenous Q6H PRN Mcarthur Rossetti, MD      . oxyCODONE (Oxy IR/ROXICODONE) immediate release tablet 10-15 mg  10-15 mg Oral Q3H PRN Mcarthur Rossetti, MD   15 mg at 09/19/17 0033  . polyethylene glycol (MIRALAX / GLYCOLAX) packet 17 g  17 g Oral Daily PRN Mcarthur Rossetti, MD      . rivaroxaban [facility]) tablet 10 mg  10 mg Oral Q breakfast Mcarthur Rossetti, MD   10 mg at 09/19/17 0830  . sorbitol 70 % solution 30 mL  30 mL Oral Daily PRN Mcarthur Rossetti, MD      . zolpidem [facility]) tablet 5 mg  5 mg Oral QHS PRN Jean Rosenthal  Y, MD   5 mg at 09/18/17 2125     Discharge Medications: Please see discharge summary for a list of discharge medications.  Relevant Imaging Results:  Relevant Lab Results:   Additional Information SS#: [id number]  Geralynn Ochs, LCSW

## 2017-09-19 NOTE — Progress Notes (Signed)
Physical Therapy Treatment Patient Details Name: Brittany Mays MRN: [id number] DOB: 07/04/1941 Today's Date: 09/19/2017    History of Present Illness Pt is a 76 y.o. female s/p L THA direct anterior approach. PMHx: Chronic pain, Back sx x4, Osteoarthritis, Bil TKA, Aortic aneurysm, Hyperlipidemia, Anxiety, Depression, Chest pain 2016, Dysphagia, GERD.     PT Comments    Continuing work on functional mobility and activity tolerance;  More pain this afternoon, and pt reported stiffness; required seated rest break mid-walk; still able to perform a set of exercises post walk; Overall progressing well; Anticipate continuing good progress at post-acute rehabilitation.    Follow Up Recommendations  DC plan and follow up therapy as arranged by surgeon;SNF     Equipment Recommendations  Rolling walker with 5" wheels;3in1 (PT)(May already have)    Recommendations for Other Services       Precautions / Restrictions Precautions Precautions: None Restrictions LLE Weight Bearing: Weight bearing as tolerated    Mobility  Bed Mobility Overal bed mobility: Needs Assistance Bed Mobility: Sit to Sidelying         Sit to sidelying: Min assist General bed mobility comments: Cues for technqiue  Transfers Overall transfer level: Needs assistance Equipment used: Rolling walker (2 wheeled) Transfers: Sit to/from Stand Sit to Stand: Min guard         General transfer comment: Cues for hand placement. Increased time and effort  Ambulation/Gait Ambulation/Gait assistance: Min guard(with and without physical contact) Ambulation Distance (Feet): 75 Feet Assistive device: Rolling walker (2 wheeled) Gait Pattern/deviations: Decreased stance time - left     General Gait Details: Cues for gait sequence and to self-monitor for activity tolerance   Stairs            Wheelchair Mobility    Modified Rankin (Stroke Patients Only)       Balance     Sitting balance-Leahy Scale:  Good                                      Cognition Arousal/Alertness: Awake/alert Behavior During Therapy: WFL for tasks assessed/performed Overall Cognitive Status: Within Functional Limits for tasks assessed                                        Exercises Total Joint Exercises Ankle Circles/Pumps: AROM;Both;10 reps Quad Sets: AROM;Left;10 reps Towel Squeeze: AROM;Both;10 reps Heel Slides: AROM;Left;10 reps Hip ABduction/ADduction: AROM;Left;10 reps Bridges: AROM;Both;10 reps    General Comments        Pertinent Vitals/Pain Pain Assessment: 0-10 Pain Score: 7  Pain Location: L hip Pain Descriptors / Indicators: Aching(Stiffness) Pain Intervention(s): Monitored during session;Premedicated before session    Home Living Family/patient expects to be discharged to:: Private residence Living Arrangements: Spouse/significant other                  Prior Function            PT Goals (current goals can now be found in the care plan section) Acute Rehab PT Goals Patient Stated Goal: Rehab then home PT Goal Formulation: With patient Time For Goal Achievement: 09/26/17 Potential to Achieve Goals: Good Progress towards PT goals: Progressing toward goals    Frequency    7X/week      PT Plan Current plan remains appropriate  Co-evaluation              AM-PAC PT "6 Clicks" Daily Activity  Outcome Measure  Difficulty turning over in bed (including adjusting bedclothes, sheets and blankets)?: A Lot Difficulty moving from lying on back to sitting on the side of the bed? : Unable Difficulty sitting down on and standing up from a chair with arms (e.g., wheelchair, bedside commode, etc,.)?: Unable Help needed moving to and from a bed to chair (including a wheelchair)?: A Little Help needed walking in hospital room?: A Little Help needed climbing 3-5 steps with a railing? : A Little 6 Click Score: 13    End of Session  Equipment Utilized During Treatment: Gait belt Activity Tolerance: Patient tolerated treatment well Patient left: in bed;with call bell/phone within reach;with family/visitor present;with SCD's reapplied Nurse Communication: Mobility status PT Visit Diagnosis: Other abnormalities of gait and mobility (R26.89);Pain Pain - Right/Left: Left Pain - part of body: Hip     Time: 4315-4008 PT Time Calculation (min) (ACUTE ONLY): 37 min  Charges:  $Gait Training: 8-22 mins $Therapeutic Exercise: 8-22 mins                    G Codes:       Roney Marion, PT  Acute Rehabilitation Services Pager [phone] Office [place] 09/19/2017, 3:09 PM

## 2017-09-19 NOTE — Clinical Social Work Note (Signed)
Clinical Social Work Assessment  Patient Details  Name: Brittany Mays MRN: [id number] Date of Birth: Nov 26, 1940  Date of referral:  09/19/17               Reason for consult:  Facility Placement                Permission sought to share information with:  Facility Sport and exercise psychologist, Family Supports Permission granted to share information::  Yes, Verbal Permission Granted  Name::     Lynnell Dike  Agency::  SNF  Relationship::  Husband, Engineer, manufacturing systems Information:     Housing/Transportation Living arrangements for the past 2 months:  Single Family Home Source of Information:  Patient Patient Interpreter Needed:  None Criminal Activity/Legal Involvement Pertinent to Current Situation/Hospitalization:  No - Comment as needed Significant Relationships:  Adult Children, Spouse Lives with:  Self, Spouse Do you feel safe going back to the place where you live?  Yes Need for family participation in patient care:  Yes (Comment)(patient requested assistance from her sons)  Care giving concerns:  Patient lives at home with spouse who is not able to physically assist her with her recovery; will benefit from short term SNF at discharge to improve ability to care for self prior to returning home with spouse.   Social Worker assessment / plan:  CSW met with patient to discuss care planning and recommendation for SNF. CSW greeted patient's husband and patient's son, Sherren Mocha, that entered the room shortly after. CSW discussed referral process and facility options available near the patient's home. CSW indicated that insurance approval would also be sought prior to transition. CSW answered questions, and indicated that she'd follow up this afternoon with bed offers. CSW completed referral and faxed out. CSW to continue to follow to transition to SNF when medically ready.  Employment status:  Retired Nurse, adult PT Recommendations:  [place] / Referral to community resources:  [place]  Patient/Family's Response to care:  Patient agreeable to SNF placement at this time.  Patient/Family's Understanding of and Emotional Response to Diagnosis, Current Treatment, and Prognosis:  Patient acknowledged that she knew that the team was recommending SNF at discharge and she was agreeable, but she wanted to stay in [place] and wanted to go somewhere that had good physical therapy. Patient discussed how the doctor had said that [facility] was a good facility but that she had been there in 2016 and wasn't impressed with their physical therapy. Patient's son discussed how they could have changed things in the past two years, and patient acknowledged that it would likely be different staff there now than when she was there previously. Patient and patient's son appreciated information and would like to discuss bed offers when available.  Emotional Assessment Appearance:  Appears stated age Attitude/Demeanor/Rapport:    Affect (typically observed):  Anxious, Appropriate Orientation:  Oriented to Self, Oriented to Place, Oriented to  Time, Oriented to Situation Alcohol / Substance use:  Not Applicable Psych involvement (Current and /or in the community):  No (Comment)  Discharge Needs  Concerns to be addressed:  Care Coordination Readmission within the last 30 days:  No Current discharge risk:  Physical Impairment Barriers to Discharge:  Continued Medical Work up, [place], [place] 09/19/2017, 12:03 PM

## 2017-09-19 NOTE — Evaluation (Signed)
Occupational Therapy Evaluation Patient Details Name: Brittany Mays MRN: [id number] DOB: [birth year] Today's Date: 09/19/2017    History of Present Illness Pt is a 76 y.o. female s/p L THA direct anterior approach. PMHx: Chronic pain, Back sx x4, Osteoarthritis, Bil TKA, Aortic aneurysm, Hyperlipidemia, Anxiety, Depression, Chest pain 2016, Dysphagia, GERD.    Clinical Impression   Pt reports she was independent with ADL PTA. Currently pt requires min guard assist for functional mobility with use of RW and min assist for LB ADL. Recommending SNF for follow up to maximize independence and safety with ADL and functional mobility prior to return home. Pt would benefit from continued skilled OT to address established goals.    Follow Up Recommendations  SNF;Supervision/Assistance - 24 hour    Equipment Recommendations  Other (comment)(TBD at next venue)    Recommendations for Other Services       Precautions / Restrictions Precautions Precautions: None Restrictions Weight Bearing Restrictions: Yes LLE Weight Bearing: Weight bearing as tolerated      Mobility Bed Mobility        General bed mobility comments: Pt OOB in chair upon arrival  Transfers Overall transfer level: Needs assistance Equipment used: Rolling walker (2 wheeled) Transfers: Sit to/from Stand Sit to Stand: Min guard         General transfer comment: Cues for hand placement. Increased time and effort    Balance Overall balance assessment: Needs assistance Sitting-balance support: Feet supported;No upper extremity supported Sitting balance-Leahy Scale: Good     Standing balance support: No upper extremity supported;During functional activity Standing balance-Leahy Scale: Fair Standing balance comment: Able to stand at sink and complete grooming task without UE support                           ADL either performed or assessed with clinical judgement   ADL Overall ADL's : Needs  assistance/impaired Eating/Feeding: Set up;Sitting   Grooming: Set up;Supervision/safety;Standing;Oral care;Wash/dry face   Upper Body Bathing: Set up;Supervision/ safety;Sitting   Lower Body Bathing: Minimal assistance;Sit to/from stand   Upper Body Dressing : Set up;Supervision/safety;Sitting   Lower Body Dressing: Minimal assistance;Sit to/from stand Lower Body Dressing Details (indicate cue type and reason): Educated on compensatory strategies for LB ADL Toilet Transfer: Min guard;Ambulation;RW Toilet Transfer Details (indicate cue type and reason): Simulated by sit to stand from chair with functional mobility         Functional mobility during ADLs: Min guard;Rolling walker       Vision         Perception     Praxis      Pertinent Vitals/Pain Pain Assessment: 0-10 Pain Score: 7  Pain Location: L hip Pain Descriptors / Indicators: Aching Pain Intervention(s): Monitored during session;Limited activity within patient's tolerance;Ice applied     Hand Dominance     Extremity/Trunk Assessment Upper Extremity Assessment Upper Extremity Assessment: Overall WFL for tasks assessed   Lower Extremity Assessment Lower Extremity Assessment: Defer to PT evaluation    Cervical / Trunk Assessment Cervical / Trunk Assessment: Normal   Communication Communication Communication: No difficulties   Cognition Arousal/Alertness: Awake/alert Behavior During Therapy: WFL for tasks assessed/performed Overall Cognitive Status: Within Functional Limits for tasks assessed                                     General Comments  Exercises    Shoulder Instructions      Home Living Family/patient expects to be discharged to:: Skilled nursing facility                                 Additional Comments: Per plan with Dr. Ninfa Linden      Prior Functioning/Environment Level of Independence: Independent;Independent with assistive device(s)                  OT Problem List: Decreased strength;Decreased range of motion;Decreased activity tolerance;Impaired balance (sitting and/or standing);Decreased knowledge of use of DME or AE;Decreased knowledge of precautions;Pain      OT Treatment/Interventions: Self-care/ADL training;Energy conservation;DME and/or AE instruction;Therapeutic activities;Patient/family education;Balance training    OT Goals(Current goals can be found in the care plan section) Acute Rehab OT Goals Patient Stated Goal: Rehab then home OT Goal Formulation: With patient Time For Goal Achievement: 10/03/17 Potential to Achieve Goals: Good ADL Goals Pt Will Perform Lower Body Bathing: with modified independence;sit to/from stand Pt Will Perform Lower Body Dressing: with modified independence;sit to/from stand Pt Will Transfer to Toilet: with modified independence;ambulating;bedside commode Pt Will Perform Toileting - Clothing Manipulation and hygiene: with modified independence;sit to/from stand  OT Frequency: Min 2X/week   Barriers to D/C:            Co-evaluation              AM-PAC PT "6 Clicks" Daily Activity     Outcome Measure Help from another person eating meals?: None Help from another person taking care of personal grooming?: A Little Help from another person toileting, which includes using toliet, bedpan, or urinal?: A Little Help from another person bathing (including washing, rinsing, drying)?: A Little Help from another person to put on and taking off regular upper body clothing?: None Help from another person to put on and taking off regular lower body clothing?: A Little 6 Click Score: 20   End of Session Equipment Utilized During Treatment: Rolling walker  Activity Tolerance: Patient tolerated treatment well Patient left: in chair;with call bell/phone within reach  OT Visit Diagnosis: Unsteadiness on feet (R26.81);Other abnormalities of gait and mobility (R26.89);Pain Pain  - Right/Left: Left Pain - part of body: Hip                Time: 9381-8299 OT Time Calculation (min): 12 min Charges:  OT General Charges $OT Visit: 1 Visit OT Evaluation $OT Eval Moderate Complexity: 1 Mod G-Codes:     Anis Cinelli A. Ulice Brilliant, M.S., OTR/L Pager: [place] 09/19/2017, 11:14 AM

## 2017-09-19 NOTE — Evaluation (Signed)
Physical Therapy Evaluation Patient Details Name: Brittany Mays MRN: [id number] DOB: 1941-04-08 Today's Date: 09/19/2017   History of Present Illness  Pt is a 76 y.o. female s/p L THA direct anterior approach. PMHx: Chronic pain, Back sx x4, Osteoarthritis, Bil TKA, Aortic aneurysm, Hyperlipidemia, Anxiety, Depression, Chest pain 2016, Dysphagia, GERD.   Clinical Impression   Pt is s/p THA resulting in the deficits listed below (see PT Problem List). Overall moving quite well; anticipate good progress;  Pt will benefit from skilled PT to increase their independence and safety with mobility to allow discharge to the venue listed below.      Follow Up Recommendations DC plan and follow up therapy as arranged by surgeon;SNF    Equipment Recommendations  Rolling walker with 5" wheels;3in1 (PT)(May already have)    Recommendations for Other Services       Precautions / Restrictions Precautions Precautions: None Restrictions Weight Bearing Restrictions: Yes LLE Weight Bearing: Weight bearing as tolerated      Mobility  Bed Mobility Overal bed mobility: Needs Assistance Bed Mobility: Rolling;Sidelying to Sit Rolling: Min assist Sidelying to sit: Mod assist       General bed mobility comments: Emplowyed rolling technique due to pt with longstanding back pain; pillow between knees for comfort; Mod assist to elevate trunk to sit  Transfers Overall transfer level: Needs assistance Equipment used: Rolling walker (2 wheeled) Transfers: Sit to/from Stand Sit to Stand: Min assist         General transfer comment: Cues for hand placement and technqiue; Overall good rise  Ambulation/Gait Ambulation/Gait assistance: Min guard(with and without physical contact) Ambulation Distance (Feet): 75 Feet Assistive device: Rolling walker (2 wheeled) Gait Pattern/deviations: Decreased stance time - left     General Gait Details: Cues for gait sequence and to self-monitor for activity  tolerance  Stairs            Wheelchair Mobility    Modified Rankin (Stroke Patients Only)       Balance Overall balance assessment: Needs assistance   Sitting balance-Leahy Scale: Good       Standing balance-Leahy Scale: Poor(approaching Fair)                               Pertinent Vitals/Pain Pain Assessment: 0-10 Pain Score: 5  Pain Location: LLE with weight bearing Pain Descriptors / Indicators: Aching Pain Intervention(s): Monitored during session;Premedicated before session    Home Living Family/patient expects to be discharged to:: Skilled nursing facility                 Additional Comments: Per plan with Dr. Ninfa Linden    Prior Function Level of Independence: Independent;Independent with assistive device(s)               Hand Dominance        Extremity/Trunk Assessment   Upper Extremity Assessment Upper Extremity Assessment: Overall WFL for tasks assessed    Lower Extremity Assessment Lower Extremity Assessment: LLE deficits/detail LLE Deficits / Details: Grossly decr AROM and strength, limited by pain postop    Cervical / Trunk Assessment Cervical / Trunk Assessment: Normal  Communication   Communication: No difficulties  Cognition Arousal/Alertness: Awake/alert Behavior During Therapy: WFL for tasks assessed/performed Overall Cognitive Status: Within Functional Limits for tasks assessed  General Comments      Exercises Total Joint Exercises Quad Sets: AROM;Left;5 reps Gluteal Sets: AROM;Both;5 reps Towel Squeeze: AROM;Both;5 reps Heel Slides: AAROM;Left;5 reps Hip ABduction/ADduction: AAROM;Left;5 reps   Assessment/Plan    PT Assessment Patient needs continued PT services  PT Problem List Decreased strength;Decreased range of motion;Decreased activity tolerance;Decreased balance;Decreased mobility;Decreased knowledge of use of DME;Decreased  knowledge of precautions;Pain       PT Treatment Interventions DME instruction;Gait training;Stair training;Functional mobility training;Therapeutic activities;Therapeutic exercise;Balance training;Patient/family education    PT Goals (Current goals can be found in the Care Plan section)  Acute Rehab PT Goals Patient Stated Goal: Hopes to get to [facility] [place] sometime PT Goal Formulation: With patient Time For Goal Achievement: 09/26/17 Potential to Achieve Goals: Good    Frequency 7X/week   Barriers to discharge        Co-evaluation               AM-PAC PT "6 Clicks" Daily Activity  Outcome Measure Difficulty turning over in bed (including adjusting bedclothes, sheets and blankets)?: A Lot Difficulty moving from lying on back to sitting on the side of the bed? : Unable Difficulty sitting down on and standing up from a chair with arms (e.g., wheelchair, bedside commode, etc,.)?: Unable Help needed moving to and from a bed to chair (including a wheelchair)?: A Little Help needed walking in hospital room?: A Little Help needed climbing 3-5 steps with a railing? : A Little 6 Click Score: 13    End of Session Equipment Utilized During Treatment: Gait belt Activity Tolerance: Patient tolerated treatment well Patient left: in chair;with call bell/phone within reach Nurse Communication: Mobility status PT Visit Diagnosis: Other abnormalities of gait and mobility (R26.89);Pain Pain - Right/Left: Left Pain - part of body: Hip    Time: 0829-0905 PT Time Calculation (min) (ACUTE ONLY): 36 min   Charges:   PT Evaluation $PT Eval Low Complexity: 1 Low PT Treatments $Gait Training: 8-22 mins   PT G Codes:        Roney Marion, PT  Acute Rehabilitation Services Pager [phone] Office [phone]   Colletta Maryland 09/19/2017, 10:45 AM

## 2017-09-20 LAB — CBC
HCT: 26.3 % — ABNORMAL LOW (ref 36.0–46.0)
HEMOGLOBIN: 8.7 g/dL — AB (ref 12.0–15.0)
MCH: 31.2 pg (ref 26.0–34.0)
MCHC: 33.1 g/dL (ref 30.0–36.0)
MCV: 94.3 fL (ref 78.0–100.0)
PLATELETS: 103 10*3/uL — AB (ref 150–400)
RBC: 2.79 MIL/uL — AB (ref 3.87–5.11)
RDW: 13.9 % (ref 11.5–15.5)
WBC: 7.6 10*3/uL (ref 4.0–10.5)

## 2017-09-20 MED ORDER — OXYCODONE HCL 10 MG PO TABS: 10.0000 mg | ORAL_TABLET | Freq: Four times a day (QID) | ORAL | 0 refills | Status: DC | PRN

## 2017-09-20 MED ORDER — RIVAROXABAN 10 MG PO TABS: 10.0000 mg | ORAL_TABLET | Freq: Every day | ORAL | 0 refills | Status: DC

## 2017-09-20 NOTE — Consult Note (Signed)
            [facility] CM Primary Care Navigator  09/20/2017  Brittany Mays 1941/09/21 [id number]   Went to see patientin the roomto identify possible discharge needs butphysical therapist is currently working with her.  Will attemptto see patient at another time when she is available in the room.    Addendum (09/21/17):  Went back to see patient at the bedside to identify possible discharge but RN is in the room giving instructions and discharging patient at this time.  Per chart review, patient is discharging to [facility] for short term rehabilitation.  Patientand son Lennette Bihari) voiced understanding to call primary care provider's officewhenshereturns back home,for a post discharge follow-up within a week or sooner if needs arise.Patient letter (with PCP's contact number) was provided as areminder.  Discussed with patient regarding [facility] CM services available for health management and she voicedunderstanding to seekreferral from primary care provider to [facility] care management ifdeemed necessary and appropriatefor services in the future- after she returns back home.  [facility] care management information provided for future needs that she may have.   For additional questions please contact:  Edwena Felty A. Cassondra Stachowski, BSN, RN-BC [facility] PRIMARY CARE Navigator Cell: [phone]

## 2017-09-20 NOTE — Clinical Social Work Note (Signed)
[facility] can accept patient tomorrow. CSW confirmed insurance is managed through [facility] and faxed clinicals for authorization review.  Dayton Scrape, [place]

## 2017-09-20 NOTE — Progress Notes (Signed)
Physical Therapy Treatment Patient Details Name: Brittany Mays MRN: [id number] DOB: 07-31-1941 Today's Date: 09/20/2017    History of Present Illness Pt is a 76 y.o. female s/p L THA direct anterior approach. PMHx: Chronic pain, Back sx x4, Osteoarthritis, Bil TKA, Aortic aneurysm, Hyperlipidemia, Anxiety, Depression, Chest pain 2016, Dysphagia, GERD.     PT Comments    Patient is making gradual progress toward mobility goals. Pt required mod A for bed mobility and min A for functional transfers. Pt tolerated gait training with min guard assist and RW. Pt continues to require premedication prior to participating. Current plan remains appropriate.    Follow Up Recommendations  DC plan and follow up therapy as arranged by surgeon;SNF     Equipment Recommendations  Rolling walker with 5" wheels;3in1 (PT)(May already have)    Recommendations for Other Services       Precautions / Restrictions Precautions Precautions: None Restrictions Weight Bearing Restrictions: Yes LLE Weight Bearing: Weight bearing as tolerated    Mobility  Bed Mobility Overal bed mobility: Needs Assistance Bed Mobility: Supine to Sit     Supine to sit: Mod assist;HOB elevated     General bed mobility comments: cues for sequencing and technqiue; assist to bring hips/bilat LE to EOB with use of bed pad and to elevate trunk into sitting; use of rail   Transfers Overall transfer level: Needs assistance Equipment used: Rolling walker (2 wheeled) Transfers: Sit to/from Stand Sit to Stand: Min assist;Min guard         General transfer comment: assist to power up into standing from EOB and min guard when standing from [facility] for safety; cues for safe hand placement  Ambulation/Gait Ambulation/Gait assistance: Min guard Ambulation Distance (Feet): 100 Feet Assistive device: Rolling walker (2 wheeled) Gait Pattern/deviations: Step-through pattern;Decreased stance time - left;Decreased step length -  right;Decreased weight shift to left;Trunk flexed Gait velocity: decreased   General Gait Details: cues for posture and sequencing   Stairs            Wheelchair Mobility    Modified Rankin (Stroke Patients Only)       Balance Overall balance assessment: Needs assistance Sitting-balance support: Feet supported;No upper extremity supported Sitting balance-Leahy Scale: Good     Standing balance support: During functional activity;Single extremity supported Standing balance-Leahy Scale: Fair                              Cognition Arousal/Alertness: Awake/alert Behavior During Therapy: WFL for tasks assessed/performed Overall Cognitive Status: Within Functional Limits for tasks assessed                                        Exercises Total Joint Exercises Quad Sets: AROM;10 reps;Both Heel Slides: Left;10 reps;AAROM Hip ABduction/ADduction: Left;10 reps;AAROM Long Arc Quad: AROM;Left;10 reps;Seated    General Comments        Pertinent Vitals/Pain Pain Assessment: 0-10 Pain Score: 6  Pain Location: L hip Pain Descriptors / Indicators: Aching;Grimacing;Guarding;Moaning Pain Intervention(s): Limited activity within patient's tolerance;Monitored during session;Premedicated before session;Repositioned    Home Living                      Prior Function            PT Goals (current goals can now be found in the care plan section) Acute  Rehab PT Goals Patient Stated Goal: Rehab then home PT Goal Formulation: With patient Time For Goal Achievement: 09/26/17 Potential to Achieve Goals: Good Progress towards PT goals: Progressing toward goals    Frequency    7X/week      PT Plan Current plan remains appropriate    Co-evaluation              AM-PAC PT "6 Clicks" Daily Activity  Outcome Measure  Difficulty turning over in bed (including adjusting bedclothes, sheets and blankets)?: A Lot Difficulty moving  from lying on back to sitting on the side of the bed? : Unable Difficulty sitting down on and standing up from a chair with arms (e.g., wheelchair, bedside commode, etc,.)?: Unable Help needed moving to and from a bed to chair (including a wheelchair)?: A Little Help needed walking in hospital room?: A Little Help needed climbing 3-5 steps with a railing? : A Little 6 Click Score: 13    End of Session Equipment Utilized During Treatment: Gait belt Activity Tolerance: Patient tolerated treatment well Patient left: with call bell/phone within reach;in chair Nurse Communication: Mobility status PT Visit Diagnosis: Other abnormalities of gait and mobility (R26.89);Pain Pain - Right/Left: Left Pain - part of body: Hip     Time: 2585-2778 PT Time Calculation (min) (ACUTE ONLY): 45 min  Charges:  $Gait Training: 8-22 mins $Therapeutic Exercise: 8-22 mins $Therapeutic Activity: 8-22 mins                    G Codes:       Earney Navy, PTA Pager: [phone]     Darliss Cheney 09/20/2017, 3:29 PM

## 2017-09-20 NOTE — Progress Notes (Signed)
Subjective: 2 Days Post-Op Procedure(s) (LRB): LEFT TOTAL HIP ARTHROPLASTY ANTERIOR APPROACH (Left) Patient reports pain as severe.  Appears comfortable . States that first PT visit after surgery went well , since then pain has been awful.  Objective: Vital signs in last 24 hours: Temp:  [98.4 F (36.9 C)-98.7 F (37.1 C)] 98.4 F (36.9 C) (11/15 0500) Pulse Rate:  [91-96] 91 (11/15 0500) Resp:  [18-20] 20 (11/15 0500) BP: (116-121)/(45-49) 116/49 (11/15 0500) SpO2:  [94 %-98 %] 98 % (11/15 0500)  Intake/Output from previous day: 11/14 0701 - 11/15 0700 In: 2776.3 [P.O.:480; I.V.:2296.3] Out: 1300 [Urine:1300] Intake/Output this shift: No intake/output data recorded.  Recent Labs    09/19/17 0544  HGB 9.7*   Recent Labs    09/19/17 0544  WBC 8.8  RBC 3.04*  HCT 28.5*  PLT 125*   Recent Labs    09/19/17 0544  NA 135  K 3.8  CL 102  CO2 28  BUN 8  CREATININE 0.77  GLUCOSE 113*  CALCIUM 8.2*   No results for input(s): LABPT, INR in the last 72 hours.  Sensation intact distally Intact pulses distally Dorsiflexion/Plantar flexion intact Incision: scant drainage Compartment soft  Assessment/Plan: 2 Days Post-Op Procedure(s) (LRB): LEFT TOTAL HIP ARTHROPLASTY ANTERIOR APPROACH (Left) Up with therapy Discharge to SNF tomorrow.   GILBERT CLARK 09/20/2017, 8:38 AM

## 2017-09-20 NOTE — Progress Notes (Signed)
CSW met with patient to discuss bed offers and preferences for facility. CSW had previously checked in with all bed offers, and discovered that all of the facilities that accepted the patient will be able to provide a private room for her; [facility] will have a $15/day charge not covered by insurance for a private room. CSW provided information to patient, and patient discussed how the family would prefer if she went to [facility] due to convenience of location. Patient admitted that she wasn't super thrilled with it, but would go along with it.  CSW to follow up with initiating insurance authorization with [facility] and follow to facilitate discharge when medically ready.   Laveda Abbe, [place] Clinical Social Worker [phone]

## 2017-09-20 NOTE — Discharge Instructions (Signed)
INSTRUCTIONS AFTER JOINT REPLACEMENT  ° °o Remove items at home which could result in a fall. This includes throw rugs or furniture in walking pathways °o ICE to the affected joint every three hours while awake for 30 minutes at a time, for at least the first 3-5 days, and then as needed for pain and swelling.  Continue to use ice for pain and swelling. You may notice swelling that will progress down to the foot and ankle.  This is normal after surgery.  Elevate your leg when you are not up walking on it.   °o Continue to use the breathing machine you got in the hospital (incentive spirometer) which will help keep your temperature down.  It is common for your temperature to cycle up and down following surgery, especially at night when you are not up moving around and exerting yourself.  The breathing machine keeps your lungs expanded and your temperature down. ° ° °DIET:  As you were doing prior to hospitalization, we recommend a well-balanced diet. ° °DRESSING / WOUND CARE / SHOWERING ° °Keep the surgical dressing until follow up.  The dressing is water proof, so you can shower without any extra covering.  IF THE DRESSING FALLS OFF or the wound gets wet inside, change the dressing with sterile gauze.  Please use good hand washing techniques before changing the dressing.  Do not use any lotions or creams on the incision until instructed by your surgeon.   ° °ACTIVITY ° °o Increase activity slowly as tolerated, but follow the weight bearing instructions below.   °o No driving for 6 weeks or until further direction given by your physician.  You cannot drive while taking narcotics.  °o No lifting or carrying greater than 10 lbs. until further directed by your surgeon. °o Avoid periods of inactivity such as sitting longer than an hour when not asleep. This helps prevent blood clots.  °o You may return to work once you are authorized by your doctor.  ° ° ° °WEIGHT BEARING  ° °Weight bearing as tolerated with assist  device (walker, cane, etc) as directed, use it as long as suggested by your surgeon or therapist, typically at least 4-6 weeks. ° ° °EXERCISES ° °Results after joint replacement surgery are often greatly improved when you follow the exercise, range of motion and muscle strengthening exercises prescribed by your doctor. Safety measures are also important to protect the joint from further injury. Any time any of these exercises cause you to have increased pain or swelling, decrease what you are doing until you are comfortable again and then slowly increase them. If you have problems or questions, call your caregiver or physical therapist for advice.  ° °Rehabilitation is important following a joint replacement. After just a few days of immobilization, the muscles of the leg can become weakened and shrink (atrophy).  These exercises are designed to build up the tone and strength of the thigh and leg muscles and to improve motion. Often times heat used for twenty to thirty minutes before working out will loosen up your tissues and help with improving the range of motion but do not use heat for the first two weeks following surgery (sometimes heat can increase post-operative swelling).  ° °These exercises can be done on a training (exercise) mat, on the floor, on a table or on a bed. Use whatever works the best and is most comfortable for you.    Use music or television while you are exercising so that   the exercises are a pleasant break in your day. This will make your life better with the exercises acting as a break in your routine that you can look forward to.   Perform all exercises about fifteen times, three times per day or as directed.  You should exercise both the operative leg and the other leg as well. ° °Exercises include: °  °• Quad Sets - Tighten up the muscle on the front of the thigh (Quad) and hold for 5-10 seconds.   °• Straight Leg Raises - With your knee straight (if you were given a brace, keep it on),  lift the leg to 60 degrees, hold for 3 seconds, and slowly lower the leg.  Perform this exercise against resistance later as your leg gets stronger.  °• Leg Slides: Lying on your back, slowly slide your foot toward your buttocks, bending your knee up off the floor (only go as far as is comfortable). Then slowly slide your foot back down until your leg is flat on the floor again.  °• Angel Wings: Lying on your back spread your legs to the side as far apart as you can without causing discomfort.  °• Hamstring Strength:  Lying on your back, push your heel against the floor with your leg straight by tightening up the muscles of your buttocks.  Repeat, but this time bend your knee to a comfortable angle, and push your heel against the floor.  You may put a pillow under the heel to make it more comfortable if necessary.  ° °A rehabilitation program following joint replacement surgery can speed recovery and prevent re-injury in the future due to weakened muscles. Contact your doctor or a physical therapist for more information on knee rehabilitation.  ° ° °CONSTIPATION ° °Constipation is defined medically as fewer than three stools per week and severe constipation as less than one stool per week.  Even if you have a regular bowel pattern at home, your normal regimen is likely to be disrupted due to multiple reasons following surgery.  Combination of anesthesia, postoperative narcotics, change in appetite and fluid intake all can affect your bowels.  ° °YOU MUST use at least one of the following options; they are listed in order of increasing strength to get the job done.  They are all available over the counter, and you may need to use some, POSSIBLY even all of these options:   ° °Drink plenty of fluids (prune juice may be helpful) and high fiber foods °Colace 100 mg by mouth twice a day  °Senokot for constipation as directed and as needed Dulcolax (bisacodyl), take with full glass of water  °Miralax (polyethylene glycol)  once or twice a day as needed. ° °If you have tried all these things and are unable to have a bowel movement in the first 3-4 days after surgery call either your surgeon or your primary doctor.   ° °If you experience loose stools or diarrhea, hold the medications until you stool forms back up.  If your symptoms do not get better within 1 week or if they get worse, check with your doctor.  If you experience "the worst abdominal pain ever" or develop nausea or vomiting, please contact the office immediately for further recommendations for treatment. ° ° °ITCHING:  If you experience itching with your medications, try taking only a single pain pill, or even half a pain pill at a time.  You can also use Benadryl over the counter for itching or also to   help with sleep.  ° °TED HOSE STOCKINGS:  Use stockings on both legs until for at least 2 weeks or as directed by physician office. They may be removed at night for sleeping. ° °MEDICATIONS:  See your medication summary on the “After Visit Summary” that nursing will review with you.  You may have some home medications which will be placed on hold until you complete the course of blood thinner medication.  It is important for you to complete the blood thinner medication as prescribed. ° °PRECAUTIONS:  If you experience chest pain or shortness of breath - call 911 immediately for transfer to the hospital emergency department.  ° °If you develop a fever greater that 101 F, purulent drainage from wound, increased redness or drainage from wound, foul odor from the wound/dressing, or calf pain - CONTACT YOUR SURGEON.   °                                                °FOLLOW-UP APPOINTMENTS:  If you do not already have a post-op appointment, please call the office for an appointment to be seen by your surgeon.  Guidelines for how soon to be seen are listed in your “After Visit Summary”, but are typically between 1-4 weeks after surgery. ° °OTHER INSTRUCTIONS:  ° °Knee  Replacement:  Do not place pillow under knee, focus on keeping the knee straight while resting. CPM instructions: 0-90 degrees, 2 hours in the morning, 2 hours in the afternoon, and 2 hours in the evening. Place foam block, curve side up under heel at all times except when in CPM or when walking.  DO NOT modify, tear, cut, or change the foam block in any way. ° °MAKE SURE YOU:  °• Understand these instructions.  °• Get help right away if you are not doing well or get worse.  ° ° °Thank you for letting us be a part of your medical care team.  It is a privilege we respect greatly.  We hope these instructions will help you stay on track for a fast and full recovery!  ° °Information on my medicine - XARELTO® (Rivaroxaban) ° ° °Why was Xarelto® prescribed for you? °Xarelto® was prescribed for you to reduce the risk of blood clots forming after orthopedic surgery. The medical term for these abnormal blood clots is venous thromboembolism (VTE). ° °What do you need to know about xarelto® ? °Take your Xarelto® ONCE DAILY at the same time every day. °You may take it either with or without food. ° °If you have difficulty swallowing the tablet whole, you may crush it and mix in applesauce just prior to taking your dose. ° °Take Xarelto® exactly as prescribed by your doctor and DO NOT stop taking Xarelto® without talking to the doctor who prescribed the medication.  Stopping without other VTE prevention medication to take the place of Xarelto® may increase your risk of developing a clot. ° °After discharge, you should have regular check-up appointments with your healthcare provider that is prescribing your Xarelto®.   ° °What do you do if you miss a dose? °If you miss a dose, take it as soon as you remember on the same day then continue your regularly scheduled once daily regimen the next day. Do not take two doses of Xarelto® on the same day.  ° °Important Safety Information °A possible   side effect of Xarelto® is bleeding. You  should call your healthcare provider right away if you experience any of the following: °? Bleeding from an injury or your nose that does not stop. °? Unusual colored urine (red or dark brown) or unusual colored stools (red or black). °? Unusual bruising for unknown reasons. °? A serious fall or if you hit your head (even if there is no bleeding). ° °Some medicines may interact with Xarelto® and might increase your risk of bleeding while on Xarelto®. To help avoid this, consult your healthcare provider or pharmacist prior to using any new prescription or non-prescription medications, including herbals, vitamins, non-steroidal anti-inflammatory drugs (NSAIDs) and supplements. ° °This website has more information on Xarelto®: www.xarelto.com. ° ° ° °

## 2017-09-21 ENCOUNTER — Inpatient Hospital Stay
Admission: RE | Admit: 2017-09-21 | Discharge: 2017-10-06 | Disposition: A | Discharge status: Home / Self Care | Payer: Medicare PPO | Source: Ambulatory Visit | Attending: Internal Medicine | Admitting: Internal Medicine

## 2017-09-21 LAB — CULTURE, OB URINE: Culture: NO GROWTH

## 2017-09-21 LAB — CBC
HEMATOCRIT: 26.2 % — AB (ref 36.0–46.0)
Hemoglobin: 8.8 g/dL — ABNORMAL LOW (ref 12.0–15.0)
MCH: 31.5 pg (ref 26.0–34.0)
MCHC: 33.6 g/dL (ref 30.0–36.0)
MCV: 93.9 fL (ref 78.0–100.0)
Platelets: 103 10*3/uL — ABNORMAL LOW (ref 150–400)
RBC: 2.79 MIL/uL — ABNORMAL LOW (ref 3.87–5.11)
RDW: 13.6 % (ref 11.5–15.5)
WBC: 7.9 10*3/uL (ref 4.0–10.5)

## 2017-09-21 MED ORDER — ZOLPIDEM TARTRATE 5 MG PO TABS: 5.0000 mg | ORAL_TABLET | Freq: Every evening | ORAL | 0 refills | Status: DC | PRN

## 2017-09-21 MED ORDER — OXYCODONE HCL 10 MG PO TABS: 10.0000 mg | ORAL_TABLET | ORAL | 0 refills | Status: DC | PRN

## 2017-09-21 MED ORDER — CYCLOBENZAPRINE HCL 5 MG PO TABS: 5.0000 mg | ORAL_TABLET | Freq: Three times a day (TID) | ORAL | 0 refills | Status: DC | PRN

## 2017-09-21 NOTE — Discharge Summary (Signed)
Patient ID: Brittany Mays MRN: [id number] DOB/AGE: 1941/02/19 76 y.o.  Admit date: 09/18/2017 Discharge date: 09/21/2017  Admission Diagnoses:  Principal Problem:   Osteoarthritis of hip (Left) Active Problems:   Status post total replacement of left hip   Discharge Diagnoses:  Same  Past Medical History:  Diagnosis Date  . Anxiety   . Aortic aneurysm ([place]) 06/06/2016  . Cataract   . Chest pain 08/18/2015  . Complication of anesthesia    as a teenager pt. had surgery and surgeon told parents that she should never have pentathal ever again  . Constipation due to opioid therapy   . Decreased muscle strength 02/10/2014  . Depression   . Difficulty in walking(719.7) 02/10/2014  . Dysphagia 08/19/2015  . Dyspnea 08/19/2015  . GERD (gastroesophageal reflux disease)    occasionally    tums OTC  . History of kidney stones   . Hyperparathyroidism, primary ([place])    s/p parathyroidectomy  . Hyperventilation 08/18/2015  . Insomnia   . Intractable back pain 04/29/2014  . Kidney stone   . Lumbar spinal stenosis    s/p laminectomy  . Odynophagia 08/18/2015  . Osteoarthritis of both knees    s/p knee replacements  . Pain   . Rectal prolapse    s/p repair  . Unintentional weight loss 09/08/2015  . UTI (lower urinary tract infection) 04/28/2014    Surgeries: Procedure(s): LEFT TOTAL HIP ARTHROPLASTY ANTERIOR APPROACH on 09/18/2017   Consultants:   Discharged Condition: Improved  Hospital Course: Brittany Mays is an 77 y.o. female who was admitted 09/18/2017 for operative treatment ofUnilateral primary osteoarthritis, left hip. Patient has severe unremitting pain that affects sleep, daily activities, and work/hobbies. After pre-op clearance the patient was taken to the operating room on 09/18/2017 and underwent  Procedure(s): LEFT TOTAL HIP ARTHROPLASTY ANTERIOR APPROACH.    Patient was given perioperative antibiotics:  Anti-infectives (From admission, onward)   Start      Dose/Rate Route Frequency Ordered Stop   09/18/17 2200  ceFAZolin (ANCEF) IVPB 1 g/50 mL premix     1 g 100 mL/hr over 30 Minutes Intravenous Every 6 hours 09/18/17 1859 09/19/17 0447   09/18/17 1500  ceFAZolin (ANCEF) IVPB 2g/100 mL premix     2 g 200 mL/hr over 30 Minutes Intravenous To ShortStay Surgical 09/17/17 1349 09/18/17 1538       Patient was given sequential compression devices, early ambulation, and chemoprophylaxis to prevent DVT.  Patient benefited maximally from hospital stay and there were no complications.    Recent vital signs:  Patient Vitals for the past 24 hrs:  BP Temp Temp src Pulse Resp SpO2  09/21/17 0537 (!) 114/59 98.7 F (37.1 C) Oral 90 16 92 %  09/20/17 2044 (!) 118/57 100.2 F (37.9 C) Oral 89 17 97 %  09/20/17 1400 (!) 144/59 (!) 100.4 F (38 C) Oral (!) 103 - 98 %  09/20/17 1000 (!) 119/54 99.7 F (37.6 C) Oral 89 - 93 %     Recent laboratory studies:  Recent Labs    09/19/17 0544 09/20/17 0552 09/21/17 0355  WBC 8.8 7.6 7.9  HGB 9.7* 8.7* 8.8*  HCT 28.5* 26.3* 26.2*  PLT 125* 103* 103*  NA 135  --   --   K 3.8  --   --   CL 102  --   --   CO2 28  --   --   BUN 8  --   --   CREATININE 0.77  --   --  GLUCOSE 113*  --   --   CALCIUM 8.2*  --   --      Discharge Medications:   Allergies as of 09/21/2017      Reactions   Fentanyl Itching, Nausea And Vomiting, Rash, Other (See Comments)   Sweating, duragesic patch only; can tolerate IV, Itching.   Nsaids Diarrhea, Nausea And Vomiting, Rash, Other (See Comments)   Abdominal cramping. "horrible cramps".   Pentothal [thiopental] Nausea And Vomiting, Rash, Other (See Comments)   UNSPECIFIED OTHER REACTION > Had as a child, and was told to never take again      Medication List    TAKE these medications   ALIGN EXTRA STRENGTH PO Take 70 capsules daily by mouth.   cyclobenzaprine 5 MG tablet Commonly known as:  FLEXERIL Take 1 tablet (5 mg total) by mouth 3 (three) times  daily as needed for muscle spasms.   dicyclomine 20 MG tablet Commonly known as:  BENTYL Take 20 mg by mouth every 6 (six) hours as needed for spasms.   docusate sodium 100 MG capsule Commonly known as:  COLACE Take 200 mg by mouth daily.   DULoxetine 60 MG capsule Commonly known as:  CYMBALTA TAKE 1 CAPSULE EVERY DAY What changed:    how much to take  how to take this  when to take this   MELATONIN PO Take 1 capsule by mouth at bedtime.   multivitamin tablet Take 1 tablet by mouth daily.   naloxone 2 MG/2ML injection Commonly known as:  NARCAN Inject content of syringe into thigh muscle. Call 911.   Oxycodone HCl 10 MG Tabs Take 1 tablet (10 mg total) every 6 (six) hours as needed by mouth. What changed:  reasons to take this   polyethylene glycol packet Commonly known as:  MIRALAX / GLYCOLAX Take 17 g by mouth daily. Mix one tablespoon with 8oz of your favorite juice or water every day until you are having soft formed stools. Then start taking once daily if you didn't have a stool the day before. What changed:    when to take this  reasons to take this  additional instructions   rivaroxaban 10 MG Tabs tablet Commonly known as:  XARELTO Take 1 tablet (10 mg total) daily with breakfast by mouth.       Diagnostic Studies: Dg Pelvis Portable  Result Date: 09/18/2017 CLINICAL DATA:  Status post left hip replacement EXAM: PORTABLE PELVIS 1-2 VIEWS COMPARISON:  04/25/2017 FINDINGS: Mild arthritis of the right hip. Calcified phleboliths in the pelvis. Sclerosis and osteophyte at the pubic symphysis. Status post left hip replacement with normal alignment. Mild soft tissue gas. New irregularity an adjacent soft tissue calcification at the left anterior superior iliac spine. IMPRESSION: 1. Status post left hip replacement with normal alignments. Soft tissue at the proximal left thigh consistent with recent surgical status. 2. Calcification or questionable bone fragments  projecting over the lateral soft tissues of the proximal thigh a 3. New irregularity and soft tissue calcification adjacent to the left anterior superior iliac spine. Electronically Signed   By: Donavan Foil M.D.   On: 09/18/2017 19:18   Dg C-arm 1-60 Min  Result Date: 09/18/2017 CLINICAL DATA: Left hip arthroplasty. EXAM: DG C-ARM 61-120 MIN; OPERATIVE LEFT HIP WITH PELVIS COMPARISON:  08/27/2017 . FINDINGS: Total left hip replacement. Anatomic alignment. No acute bony abnormality. IMPRESSION: Total left hip replacement.  No acute abnormality . Electronically Signed   By: Marcello Moores  Register   On: 09/18/2017  16:47   Dg Hip Operative Unilat W Or W/o Pelvis Left  Result Date: 09/18/2017 CLINICAL DATA: Left hip arthroplasty. EXAM: DG C-ARM 61-120 MIN; OPERATIVE LEFT HIP WITH PELVIS COMPARISON:  08/27/2017 . FINDINGS: Total left hip replacement. Anatomic alignment. No acute bony abnormality. IMPRESSION: Total left hip replacement.  No acute abnormality . Electronically Signed   By: Marcello Moores  Register   On: 09/18/2017 16:47   Xr Hip Unilat W Or W/o Pelvis 1v Left  Result Date: 08/27/2017 X-rays of her left hip show severe osteoarthritis and degenerative joint disease.  Xr Knee 1-2 Views Left  Result Date: 09/05/2017 AP and lateral views of the left knee: No acute fracture.  Knee is well located.  Status post left total knee arthroplasty without any signs of loosening or hardware failure.   Disposition: to skilled nursing facility    Contact information for follow-up providers    Mcarthur Rossetti, MD. Schedule an appointment as soon as possible for a visit in 2 week(s).   Specialty:  Orthopedic Surgery Contact information: [place] [address] [phone]            Contact information for after-discharge care    [facility] .   Service:  Skilled Nursing Contact information: [address] [place] [phone]                   Signed: Mcarthur Rossetti 09/21/2017, 7:02 AM

## 2017-09-21 NOTE — Progress Notes (Signed)
Occupational Therapy Treatment Patient Details Name: Brittany Mays MRN: [id number] DOB: [birth year] Today's Date: 09/21/2017    History of present illness Pt is a 76 y.o. female s/p L THA direct anterior approach. PMHx: Chronic pain, Back sx x4, Osteoarthritis, Bil TKA, Aortic aneurysm, Hyperlipidemia, Anxiety, Depression, Chest pain 2016, Dysphagia, GERD.    OT comments  Pt progressing towards acute OT goals. Focus of session was bed mobility, toilet transfer, pericare, and in-room functional mobility. Pain management continues to be an issue for pt. Pt verbalized concern about the level and type of pain medication she needs now and how that relates to timing d/c to SNF. "I don't want to go over there until my pain is better and I'm not needing the Diluadid." D/c recommendation to SNF remains appropriate.    Follow Up Recommendations  SNF;Supervision/Assistance - 24 hour    Equipment Recommendations  Other (comment)(TBD at next venue)    Recommendations for Other Services      Precautions / Restrictions Precautions Precautions: None Restrictions Weight Bearing Restrictions: Yes LLE Weight Bearing: Weight bearing as tolerated       Mobility Bed Mobility Overal bed mobility: Needs Assistance Bed Mobility: Supine to Sit     Supine to sit: Mod assist;HOB elevated     General bed mobility comments: HOB fully elevated. Assist to advance   Transfers Overall transfer level: Needs assistance Equipment used: Rolling walker (2 wheeled) Transfers: Sit to/from Stand Sit to Stand: Min assist;Min guard         General transfer comment: assist to steady and control descent.     Balance Overall balance assessment: Needs assistance Sitting-balance support: Feet supported;No upper extremity supported Sitting balance-Leahy Scale: Fair Sitting balance - Comments: guards L hip leading to R lean sitting EOB   Standing balance support: During functional activity;Single extremity  supported Standing balance-Leahy Scale: Poor                             ADL either performed or assessed with clinical judgement   ADL Overall ADL's : Needs assistance/impaired                         Toilet Transfer: Min guard;Ambulation;RW Toilet Transfer Details (indicate cue type and reason): very guarded with movements.  Toileting- Clothing Manipulation and Hygiene: Minimal assistance;Sit to/from stand       Functional mobility during ADLs: Min guard;Rolling walker General ADL Comments: Close min guard for functional mobility and toilet transfer, min A for pericare. Very guarded with L hip (leans to right in sitting)     Vision       Perception     Praxis      Cognition Arousal/Alertness: Awake/alert Behavior During Therapy: WFL for tasks assessed/performed Overall Cognitive Status: Within Functional Limits for tasks assessed                                          Exercises     Shoulder Instructions       General Comments      Pertinent Vitals/ Pain       Pain Assessment: Faces Faces Pain Scale: Hurts even more Pain Location: L hip Pain Descriptors / Indicators: Aching;Grimacing;Guarding;Moaning Pain Intervention(s): Limited activity within patient's tolerance;Monitored during session;Premedicated before session;Repositioned  Home Living  Prior Functioning/Environment              Frequency  Min 2X/week        Progress Toward Goals  OT Goals(current goals can now be found in the care plan section)  Progress towards OT goals: Progressing toward goals  Acute Rehab OT Goals Patient Stated Goal: Rehab then home OT Goal Formulation: With patient Time For Goal Achievement: 10/03/17 Potential to Achieve Goals: Good ADL Goals Pt Will Perform Lower Body Bathing: with modified independence;sit to/from stand Pt Will Perform Lower Body Dressing:  with modified independence;sit to/from stand Pt Will Transfer to Toilet: with modified independence;ambulating;bedside commode Pt Will Perform Toileting - Clothing Manipulation and hygiene: with modified independence;sit to/from stand  Plan Discharge plan remains appropriate    Co-evaluation                 AM-PAC PT "6 Clicks" Daily Activity     Outcome Measure   Help from another person eating meals?: None Help from another person taking care of personal grooming?: A Little Help from another person toileting, which includes using toliet, bedpan, or urinal?: A Little Help from another person bathing (including washing, rinsing, drying)?: A Little Help from another person to put on and taking off regular upper body clothing?: None Help from another person to put on and taking off regular lower body clothing?: A Little 6 Click Score: 20    End of Session Equipment Utilized During Treatment: Rolling walker  OT Visit Diagnosis: Unsteadiness on feet (R26.81);Other abnormalities of gait and mobility (R26.89);Pain Pain - Right/Left: Left Pain - part of body: Hip   Activity Tolerance Patient tolerated treatment well;Patient limited by pain   Patient Left in chair;with call bell/phone within reach   Nurse Communication          Time: 1610-9604 OT Time Calculation (min): 25 min  Charges: OT General Charges $OT Visit: 1 Visit OT Treatments $Self Care/Home Management : 23-37 mins     Hortencia Pilar 09/21/2017, 1:03 PM

## 2017-09-21 NOTE — Progress Notes (Signed)
Patient was given discharge instructions and prescriptions. Patient left unit via wheelchair with nursing staff and family in stable condition on the way to [facility].

## 2017-09-21 NOTE — Clinical Social Work Note (Signed)
CSW facilitated patient discharge including contacting patient family (Son, Lennette Bihari) and facility to confirm patient discharge plans. Clinical information faxed to facility and family agreeable with plan. Patient's son, Lennette Bihari, will transport patient by car. RN to call report prior to discharge [phone]).  CSW will sign off for now as social work intervention is no longer needed. Please consult [place] again if new needs arise.  Dayton Scrape, [place]

## 2017-09-21 NOTE — Discharge Summary (Signed)
Patient ID: TEIGEN PARSLOW MRN: [id number] DOB/AGE: [birth year] 76 y.o.  Admit date: 09/18/2017 Discharge date: 09/21/2017  Admission Diagnoses:  Principal Problem:   Osteoarthritis of hip (Left) Active Problems:   Status post total replacement of left hip   Discharge Diagnoses:  Same  Past Medical History:  Diagnosis Date  . Anxiety   . Aortic aneurysm ([place]) 06/06/2016  . Cataract   . Chest pain 08/18/2015  . Complication of anesthesia    as a teenager pt. had surgery and surgeon told parents that she should never have pentathal ever again  . Constipation due to opioid therapy   . Decreased muscle strength 02/10/2014  . Depression   . Difficulty in walking(719.7) 02/10/2014  . Dysphagia 08/19/2015  . Dyspnea 08/19/2015  . GERD (gastroesophageal reflux disease)    occasionally    tums OTC  . History of kidney stones   . Hyperparathyroidism, primary ([place])    s/p parathyroidectomy  . Hyperventilation 08/18/2015  . Insomnia   . Intractable back pain 04/29/2014  . Kidney stone   . Lumbar spinal stenosis    s/p laminectomy  . Odynophagia 08/18/2015  . Osteoarthritis of both knees    s/p knee replacements  . Pain   . Rectal prolapse    s/p repair  . Unintentional weight loss 09/08/2015  . UTI (lower urinary tract infection) 04/28/2014    Surgeries: Procedure(s): LEFT TOTAL HIP ARTHROPLASTY ANTERIOR APPROACH on 09/18/2017   Consultants:   Discharged Condition: Improved  Hospital Course: MERRIANNE MCCUMBERS is an 76 y.o. female who was admitted 09/18/2017 for operative treatment ofUnilateral primary osteoarthritis, left hip. Patient has severe unremitting pain that affects sleep, daily activities, and work/hobbies. After pre-op clearance the patient was taken to the operating room on 09/18/2017 and underwent  Procedure(s): LEFT TOTAL HIP ARTHROPLASTY ANTERIOR APPROACH.    Patient was given perioperative antibiotics:  Anti-infectives (From admission, onward)   Start      Dose/Rate Route Frequency Ordered Stop   09/18/17 2200  ceFAZolin (ANCEF) IVPB 1 g/50 mL premix     1 g 100 mL/hr over 30 Minutes Intravenous Every 6 hours 09/18/17 1859 09/19/17 0447   09/18/17 1500  ceFAZolin (ANCEF) IVPB 2g/100 mL premix     2 g 200 mL/hr over 30 Minutes Intravenous To [facility] 09/17/17 1349 09/18/17 1538       Patient was given sequential compression devices, early ambulation, and chemoprophylaxis to prevent DVT.  Patient benefited maximally from hospital stay and there were no complications.    Recent vital signs:  Patient Vitals for the past 24 hrs:  BP Temp Temp src Pulse Resp SpO2  09/21/17 1438 120/60 98.5 F (36.9 C) Oral 88 16 97 %  09/21/17 0537 (!) 114/59 98.7 F (37.1 C) Oral 90 16 92 %  09/20/17 2044 (!) 118/57 100.2 F (37.9 C) Oral 89 17 97 %     Recent laboratory studies:  Recent Labs    09/19/17 0544 09/20/17 0552 09/21/17 0355  WBC 8.8 7.6 7.9  HGB 9.7* 8.7* 8.8*  HCT 28.5* 26.3* 26.2*  PLT 125* 103* 103*  NA 135  --   --   K 3.8  --   --   CL 102  --   --   CO2 28  --   --   BUN 8  --   --   CREATININE 0.77  --   --   GLUCOSE 113*  --   --   CALCIUM 8.2*  --   --  Discharge Medications:   Allergies as of 09/21/2017      Reactions   Fentanyl Itching, Nausea And Vomiting, Rash, Other (See Comments)   Sweating, duragesic patch only; can tolerate IV, Itching.   Nsaids Diarrhea, Nausea And Vomiting, Rash, Other (See Comments)   Abdominal cramping. "horrible cramps".   Pentothal [thiopental] Nausea And Vomiting, Rash, Other (See Comments)   UNSPECIFIED OTHER REACTION > Had as a child, and was told to never take again      Medication List    TAKE these medications   ALIGN EXTRA STRENGTH PO Take 70 capsules daily by mouth.   cyclobenzaprine 5 MG tablet Commonly known as:  FLEXERIL Take 1-2 tablets (5-10 mg total) 3 (three) times daily as needed by mouth for muscle spasms. What changed:  how much to take    dicyclomine 20 MG tablet Commonly known as:  BENTYL Take 20 mg by mouth every 6 (six) hours as needed for spasms.   docusate sodium 100 MG capsule Commonly known as:  COLACE Take 200 mg by mouth daily.   DULoxetine 60 MG capsule Commonly known as:  CYMBALTA TAKE 1 CAPSULE EVERY DAY What changed:    how much to take  how to take this  when to take this   MELATONIN PO Take 1 capsule by mouth at bedtime.   multivitamin tablet Take 1 tablet by mouth daily.   naloxone 2 MG/2ML injection Commonly known as:  NARCAN Inject content of syringe into thigh muscle. Call 911.   Oxycodone HCl 10 MG Tabs Take 1-1.5 tablets (10-15 mg total) every 4 (four) hours as needed by mouth for severe pain ((score 7 to 10)). What changed:    how much to take  when to take this  reasons to take this   polyethylene glycol packet Commonly known as:  MIRALAX / GLYCOLAX Take 17 g by mouth daily. Mix one tablespoon with 8oz of your favorite juice or water every day until you are having soft formed stools. Then start taking once daily if you didn't have a stool the day before. What changed:    when to take this  reasons to take this  additional instructions   rivaroxaban 10 MG Tabs tablet Commonly known as:  XARELTO Take 1 tablet (10 mg total) daily with breakfast by mouth.   zolpidem 5 MG tablet Commonly known as:  AMBIEN Take 1 tablet (5 mg total) at bedtime as needed by mouth for sleep.       Diagnostic Studies: Dg Pelvis Portable  Result Date: 09/18/2017 CLINICAL DATA:  Status post left hip replacement EXAM: PORTABLE PELVIS 1-2 VIEWS COMPARISON:  04/25/2017 FINDINGS: Mild arthritis of the right hip. Calcified phleboliths in the pelvis. Sclerosis and osteophyte at the pubic symphysis. Status post left hip replacement with normal alignment. Mild soft tissue gas. New irregularity an adjacent soft tissue calcification at the left anterior superior iliac spine. IMPRESSION: 1. Status  post left hip replacement with normal alignments. Soft tissue at the proximal left thigh consistent with recent surgical status. 2. Calcification or questionable bone fragments projecting over the lateral soft tissues of the proximal thigh a 3. New irregularity and soft tissue calcification adjacent to the left anterior superior iliac spine. Electronically Signed   By: Donavan Foil M.D.   On: 09/18/2017 19:18   Dg C-arm 1-60 Min  Result Date: 09/18/2017 CLINICAL DATA: Left hip arthroplasty. EXAM: DG C-ARM 61-120 MIN; OPERATIVE LEFT HIP WITH PELVIS COMPARISON:  08/27/2017 . FINDINGS: Total left  hip replacement. Anatomic alignment. No acute bony abnormality. IMPRESSION: Total left hip replacement.  No acute abnormality . Electronically Signed   By: Marcello Moores  Register   On: 09/18/2017 16:47   Dg Hip Operative Unilat W Or W/o Pelvis Left  Result Date: 09/18/2017 CLINICAL DATA: Left hip arthroplasty. EXAM: DG C-ARM 61-120 MIN; OPERATIVE LEFT HIP WITH PELVIS COMPARISON:  08/27/2017 . FINDINGS: Total left hip replacement. Anatomic alignment. No acute bony abnormality. IMPRESSION: Total left hip replacement.  No acute abnormality . Electronically Signed   By: Marcello Moores  Register   On: 09/18/2017 16:47   Xr Hip Unilat W Or W/o Pelvis 1v Left  Result Date: 08/27/2017 X-rays of her left hip show severe osteoarthritis and degenerative joint disease.  Xr Knee 1-2 Views Left  Result Date: 09/05/2017 AP and lateral views of the left knee: No acute fracture.  Knee is well located.  Status post left total knee arthroplasty without any signs of loosening or hardware failure.   Disposition: to skilled nursing facility    Contact information for follow-up providers    Mcarthur Rossetti, MD. Schedule an appointment as soon as possible for a visit in 2 week(s).   Specialty:  Orthopedic Surgery Contact information: [place] [address] [phone]            Contact  information for after-discharge care    [facility] .   Service:  Skilled Nursing Contact information: [address] [place] [phone]                   Signed: Mcarthur Rossetti 09/21/2017, 4:24 PM

## 2017-09-21 NOTE — Progress Notes (Signed)
Physical Therapy Treatment Patient Details Name: Brittany Mays MRN: [id number] DOB: 05-15-1941 Today's Date: 09/21/2017    History of Present Illness Pt is a 76 y.o. female s/p L THA direct anterior approach. PMHx: Chronic pain, Back sx x4, Osteoarthritis, Bil TKA, Aortic aneurysm, Hyperlipidemia, Anxiety, Depression, Chest pain 2016, Dysphagia, GERD.     PT Comments    Patient is making gradual progress toward mobility goals. Pt demonstrated increased activity tolerance however does need extra time and seated rest breaks due to c/o pain. Current plan remains appropriate.    Follow Up Recommendations  DC plan and follow up therapy as arranged by surgeon;SNF     Equipment Recommendations  Rolling walker with 5" wheels;3in1 (PT)(May already have)    Recommendations for Other Services       Precautions / Restrictions Precautions Precautions: None Restrictions Weight Bearing Restrictions: Yes LLE Weight Bearing: Weight bearing as tolerated    Mobility  Bed Mobility               General bed mobility comments: pt OOB in chair upon arrival  Transfers Overall transfer level: Needs assistance Equipment used: Rolling walker (2 wheeled) Transfers: Sit to/from Stand Sit to Stand: Min assist;Min guard         General transfer comment: carry over of safe hand placement demonstrated  Ambulation/Gait Ambulation/Gait assistance: Min guard Ambulation Distance (Feet): (pt able to ambulate ~101ft X3 with 3 seated rest breaks) Assistive device: Rolling walker (2 wheeled) Gait Pattern/deviations: Step-through pattern;Decreased stance time - left;Decreased step length - right;Decreased weight shift to left;Trunk flexed;Antalgic Gait velocity: decreased   General Gait Details: cues for posture and L heel strike/toe off   Stairs            Wheelchair Mobility    Modified Rankin (Stroke Patients Only)       Balance Overall balance assessment: Needs  assistance Sitting-balance support: Feet supported;No upper extremity supported Sitting balance-Leahy Scale: Fair Sitting balance - Comments: guards L hip leading to R lean sitting EOB   Standing balance support: During functional activity;Single extremity supported Standing balance-Leahy Scale: Fair                              Cognition Arousal/Alertness: Awake/alert Behavior During Therapy: WFL for tasks assessed/performed Overall Cognitive Status: Within Functional Limits for tasks assessed                                 General Comments: fixated on pain       Exercises      General Comments General comments (skin integrity, edema, etc.): increased time due to c/o pain       Pertinent Vitals/Pain Pain Assessment: 0-10 Pain Score: 8  Pain Location: L hip Pain Descriptors / Indicators: Grimacing;Guarding;Moaning;Burning Pain Intervention(s): Limited activity within patient's tolerance;Monitored during session;Premedicated before session;Repositioned;Ice applied    Home Living                      Prior Function            PT Goals (current goals can now be found in the care plan section) Acute Rehab PT Goals Patient Stated Goal: Rehab then home PT Goal Formulation: With patient Time For Goal Achievement: 09/26/17 Potential to Achieve Goals: Good Progress towards PT goals: Progressing toward goals    Frequency  7X/week      PT Plan Current plan remains appropriate    Co-evaluation              AM-PAC PT "6 Clicks" Daily Activity  Outcome Measure  Difficulty turning over in bed (including adjusting bedclothes, sheets and blankets)?: A Lot Difficulty moving from lying on back to sitting on the side of the bed? : Unable Difficulty sitting down on and standing up from a chair with arms (e.g., wheelchair, bedside commode, etc,.)?: Unable Help needed moving to and from a bed to chair (including a wheelchair)?: A  Little Help needed walking in hospital room?: A Little Help needed climbing 3-5 steps with a railing? : A Little 6 Click Score: 13    End of Session Equipment Utilized During Treatment: Gait belt Activity Tolerance: Patient tolerated treatment well Patient left: with call bell/phone within reach;in chair Nurse Communication: Mobility status PT Visit Diagnosis: Other abnormalities of gait and mobility (R26.89);Pain Pain - Right/Left: Left Pain - part of body: Hip     Time: 3383-2919 PT Time Calculation (min) (ACUTE ONLY): 37 min  Charges:  $Gait Training: 8-22 mins $Therapeutic Activity: 8-22 mins                    G Codes:       Earney Navy, PTA Pager: [phone]     Darliss Cheney 09/21/2017, 4:21 PM

## 2017-09-21 NOTE — Clinical Social Work Note (Addendum)
Discharge summary sent to [facility]. Insurance authorization still pending but CSW did confirm that [facility] received the clinicals that were faxed over yesterday.  Dayton Scrape, [place] [phone]  1:13 pm Insurance authorization still pending.  Dayton Scrape, [place]  2:52 pm Authorization approved. Case manager at [facility] will call facility with details.  Dayton Scrape, [place] [phone]  3:37 pm Patient still concerned about getting IV pain medications. CSW explained that no facility will take her with IV pain medications but that the therapists at the SNF can tailor her therapy to fit her needs, the MD/NP/PA at the facility can adjust medications, etc. Discussed that if she stays another night she risks paying out of pocket for the stay since she is discharged and losing her bed at the SNF. CSW called patient's son to update him as well. He stated he would be here in 1-1 1/2 hours.  Dayton Scrape, [place]

## 2017-09-21 NOTE — Progress Notes (Signed)
Patient ID: Brittany Mays, female   DOB: 11/21/1940, 76 y.o.   MRN: [id number] Doing well.  Can go to skilled nursing today.

## 2017-09-24 ENCOUNTER — Non-Acute Institutional Stay (SKILLED_NURSING_FACILITY): Payer: Medicare PPO | Admitting: Internal Medicine

## 2017-09-24 ENCOUNTER — Encounter (HOSPITAL_COMMUNITY)
Admission: RE | Admit: 2017-09-24 | Discharge: 2017-09-24 | Disposition: A | Discharge status: Home / Self Care | Payer: Medicare PPO | Source: Skilled Nursing Facility | Attending: Internal Medicine | Admitting: Internal Medicine

## 2017-09-24 DIAGNOSIS — G8918 Other acute postprocedural pain: Secondary | ICD-10-CM | POA: Diagnosis not present

## 2017-09-24 DIAGNOSIS — G894 Chronic pain syndrome: Secondary | ICD-10-CM

## 2017-09-24 DIAGNOSIS — Z471 Aftercare following joint replacement surgery: Secondary | ICD-10-CM | POA: Insufficient documentation

## 2017-09-24 DIAGNOSIS — Z96642 Presence of left artificial hip joint: Secondary | ICD-10-CM

## 2017-09-24 DIAGNOSIS — K5903 Drug induced constipation: Secondary | ICD-10-CM

## 2017-09-24 DIAGNOSIS — D649 Anemia, unspecified: Secondary | ICD-10-CM

## 2017-09-24 DIAGNOSIS — T402X5A Adverse effect of other opioids, initial encounter: Secondary | ICD-10-CM

## 2017-09-24 LAB — CBC
HCT: 24.5 % — ABNORMAL LOW (ref 36.0–46.0)
HEMOGLOBIN: 7.9 g/dL — AB (ref 12.0–15.0)
MCH: 31.2 pg (ref 26.0–34.0)
MCHC: 32.2 g/dL (ref 30.0–36.0)
MCV: 96.8 fL (ref 78.0–100.0)
Platelets: 178 10*3/uL (ref 150–400)
RBC: 2.53 MIL/uL — AB (ref 3.87–5.11)
RDW: 13 % (ref 11.5–15.5)
WBC: 5.2 10*3/uL (ref 4.0–10.5)

## 2017-09-24 LAB — BASIC METABOLIC PANEL
ANION GAP: 7 (ref 5–15)
BUN: 9 mg/dL (ref 6–20)
CHLORIDE: 101 mmol/L (ref 101–111)
CO2: 31 mmol/L (ref 22–32)
Calcium: 8 mg/dL — ABNORMAL LOW (ref 8.9–10.3)
Creatinine, Ser: 0.71 mg/dL (ref 0.44–1.00)
GFR calc non Af Amer: >60 mL/min (ref >60)
Glucose, Bld: 100 mg/dL — ABNORMAL HIGH (ref 65–99)
POTASSIUM: 3.5 mmol/L (ref 3.5–5.1)
SODIUM: 139 mmol/L (ref 135–145)

## 2017-09-25 ENCOUNTER — Encounter: Payer: Self-pay | Admitting: Internal Medicine

## 2017-09-25 DIAGNOSIS — D649 Anemia, unspecified: Secondary | ICD-10-CM | POA: Insufficient documentation

## 2017-09-25 NOTE — Progress Notes (Signed)
Provider:  Veleta Miners Location:   [place] Room Number: 158/P Place of Service:  SNF (31)  PCP: [name], MD Patient Care Team: [name], MD as PCP - General (Family Medicine) [name], MD [facility] Medicine)  Extended Emergency Contact Information Primary Emergency Contact: Freda Jackson, [place] [address] Johnnette Litter of [place] Work Phone: [phone] Mobile Phone: [phone] Relation: Son Secondary Emergency Contact: Ledford,John Address: [address] [place]          [place], [place] [address] [place] of [place] Phone: [phone] Relation: Spouse  Code Status: Full Code Goals of Care: Advanced Directive information Advanced Directives 09/19/2017  Does Patient Have a Medical Advance Directive? Yes  Type of Paramedic of [place];Living will  Does patient want to make changes to medical advance directive? No - Patient declined  Copy of [place] in Chart? No - copy requested  Pre-existing out of facility DNR order (yellow form or pink MOST form) -      Chief Complaint  Patient presents with  . New Admit To SNF    New Admission Visit    HPI: Patient is a 76 y.o. female seen today for admission to SNF for therapy after undergoing Left Total Hip Arthroplasty on 11/13  Patient has h/o Chronic Pain syndrome, Arthritis, Long term Opioid use,  Multiple Spinal Surgeries, Hyperlipidemia, and opiod induced Constipation  She was admitted from 11/13 to 11/16 and Underwent Left hip arthroplasty for unremitting pain in left hip. Her post op course was uncomplicated and she is discharged to facility for therapy. Her main complain since she has been here has been pain control. At one pint she was asking for 15 mg of Oxycodone , Flexeril and Bentyl for her pain and according to the Nurses she was staying lethargic. She was also asking for Dilaudid. When awake she will  try to walk around in her room without her walker.She also is taking Ambien at night to help her sleep. Patient today seem to be better. Still want to know what can be done for her pain. No other complains. Patient lives with her husband. And says she was driving and independent before surgery.  Past Medical History:  Diagnosis Date  . Anxiety   . Aortic aneurysm ([place]) 06/06/2016  . Cataract   . Chest pain 08/18/2015  . Complication of anesthesia    as a teenager pt. had surgery and surgeon told parents that she should never have pentathal ever again  . Constipation due to opioid therapy   . Decreased muscle strength 02/10/2014  . Depression   . Difficulty in walking(719.7) 02/10/2014  . Dysphagia 08/19/2015  . Dyspnea 08/19/2015  . GERD (gastroesophageal reflux disease)    occasionally    tums OTC  . History of kidney stones   . Hyperparathyroidism, primary ([place])    s/p parathyroidectomy  . Hyperventilation 08/18/2015  . Insomnia   . Intractable back pain 04/29/2014  . Kidney stone   . Lumbar spinal stenosis    s/p laminectomy  . Odynophagia 08/18/2015  . Osteoarthritis of both knees    s/p knee replacements  . Pain   . Rectal prolapse    s/p repair  . Unintentional weight loss 09/08/2015  . UTI (lower urinary tract infection) 04/28/2014   Past Surgical History:  Procedure Laterality Date  . ABDOMINAL HYSTERECTOMY    . APPENDECTOMY    .  BACK SURGERY  07-2015    total 4 major back surgeries  . BREAST LUMPECTOMY     Reports this is benign  . COLONOSCOPY  2013  . JOINT REPLACEMENT    . PARATHYROIDECTOMY  1990's  . SPINE SURGERY    . TONSILLECTOMY    . TOTAL HIP ARTHROPLASTY Left 09/18/2017  . TOTAL HIP ARTHROPLASTY Left 09/18/2017   Procedure: LEFT TOTAL HIP ARTHROPLASTY ANTERIOR APPROACH;  Surgeon: Mcarthur Rossetti, MD;  Location: [facility];  Service: Orthopedics;  Laterality: Left;  . TOTAL KNEE ARTHROPLASTY Bilateral 1990's nd 2002    reports that she has been  smoking cigarettes.  She has a 25.00 pack-year smoking history. she has never used smokeless tobacco. She reports that she does not drink alcohol or use drugs. Social History   Socioeconomic History  . Marital status: Married    Spouse name: Not on file  . Number of children: Not on file  . Years of education: Not on file  . Highest education level: Not on file  Social Needs  . Financial resource strain: Not on file  . Food insecurity - worry: Not on file  . Food insecurity - inability: Not on file  . Transportation needs - medical: Not on file  . Transportation needs - non-medical: Not on file  Occupational History  . Not on file  Tobacco Use  . Smoking status: Current Every Day Smoker    Packs/day: 0.50    Years: 50.00    Pack years: 25.00    Types: Cigarettes  . Smokeless tobacco: Never Used  Substance and Sexual Activity  . Alcohol use: No    Alcohol/week: 0.0 oz  . Drug use: No  . Sexual activity: Not Currently  Other Topics Concern  . Not on file  Social History Narrative  . Not on file    Functional Status Survey:    Family History  Problem Relation Age of Onset  . Mental illness Mother   . COPD Father   . Heart failure Father   . Diabetes Son     Health Maintenance  Topic Date Due  . Samul Dada  08/25/2022  . INFLUENZA VACCINE  Completed  . DEXA SCAN  Completed  . PNA vac Low Risk Adult  Completed    Allergies  Allergen Reactions  . Fentanyl Itching, Nausea And Vomiting, Rash and Other (See Comments)    Sweating, duragesic patch only; can tolerate IV, Itching.  . Nsaids Diarrhea, Nausea And Vomiting, Rash and Other (See Comments)    Abdominal cramping. "horrible cramps".   . Pentothal [Thiopental] Nausea And Vomiting, Rash and Other (See Comments)    UNSPECIFIED OTHER REACTION > Had as a child, and was told to never take again     Outpatient Encounter Medications as of 09/24/2017  Medication Sig  . cyclobenzaprine (FLEXERIL) 5 MG tablet  Take 1-2 tablets (5-10 mg total) 3 (three) times daily as needed by mouth for muscle spasms.  Marland Kitchen dicyclomine (BENTYL) 20 MG tablet Take 20 mg by mouth every 6 (six) hours as needed for spasms.   Marland Kitchen docusate sodium (COLACE) 100 MG capsule Take 200 mg by mouth daily.  . DULoxetine (CYMBALTA) 60 MG capsule TAKE 1 CAPSULE EVERY DAY  . Multiple Vitamin (MULTIVITAMIN) tablet Take 1 tablet by mouth daily.  Marland Kitchen oxyCODONE 10 MG TABS Take 1-1.5 tablets (10-15 mg total) every 4 (four) hours as needed by mouth for severe pain ((score 7 to 10)).  Marland Kitchen polyethylene glycol (MIRALAX /  GLYCOLAX) packet Take 17 g by mouth daily. Mix one tablespoon with 8oz of your favorite juice or water every day until you are having soft formed stools. Then start taking once daily if you didn't have a stool the day before.  . Probiotic Product (ALIGN EXTRA STRENGTH PO) Take 70 capsules daily by mouth.   . rivaroxaban (XARELTO) 10 MG TABS tablet Take 1 tablet (10 mg total) daily with breakfast by mouth.  . zolpidem (AMBIEN) 5 MG tablet Take 1 tablet (5 mg total) at bedtime as needed by mouth for sleep.  . [DISCONTINUED] MELATONIN PO Take 1 capsule by mouth at bedtime.  . [DISCONTINUED] naloxone (NARCAN) 2 MG/2ML injection Inject content of syringe into thigh muscle. Call 911.   No facility-administered encounter medications on file as of 09/24/2017.      Review of Systems  Review of Systems  Constitutional: Negative for activity change, appetite change, chills, diaphoresis, fatigue and fever.  HENT: Negative for mouth sores, postnasal drip, rhinorrhea, sinus pain and sore throat.   Respiratory: Negative for apnea, cough, chest tightness, shortness of breath and wheezing.   Cardiovascular: Negative for chest pain, palpitations and leg swelling.  Gastrointestinal: Negative for abdominal distention, abdominal pain, constipation, diarrhea, nausea and vomiting.  Genitourinary: Negative for dysuria and frequency.  Musculoskeletal:  Positive for arthralgias, joint swelling and myalgias.  Skin: Negative for rash.  Neurological: Negative for dizziness, syncope, weakness, light-headedness and numbness.  Psychiatric/Behavioral: Negative for behavioral problems, confusion and positive for sleep disturbance.     Vitals:   09/25/17 1137  BP: 108/62  Pulse: 79  Resp: 18  Temp: (!) 97.5 F (36.4 C)  TempSrc: Oral   There is no height or weight on file to calculate BMI. Physical Exam  Constitutional: She is oriented to person, place, and time. She appears well-developed and well-nourished.  HENT:  Head: Normocephalic.  Mouth/Throat: Oropharynx is clear and moist.  Neck: Neck supple.  Cardiovascular: Normal rate, regular rhythm and normal heart sounds.  No murmur heard. Pulmonary/Chest: Effort normal and breath sounds normal. No respiratory distress. She has no wheezes. She has no rales.  Abdominal: Soft. Bowel sounds are normal. She exhibits no distension. There is no tenderness. There is no rebound.  Musculoskeletal: She exhibits no edema.  Trace edema in Left leg  Neurological: She is alert and oriented to person, place, and time.  Has no focal deficit  Skin: Skin is warm and dry.  Psychiatric: She has a normal mood and affect. Her behavior is normal. Thought content normal.    Labs reviewed: Basic Metabolic Panel: Recent Labs    09/11/17 1330 09/19/17 0544 09/24/17 0635  NA 137 135 139  K 4.2 3.8 3.5  CL 104 102 101  CO2 25 28 31   GLUCOSE 92 113* 100*  BUN 16 8 9   CREATININE 0.77 0.77 0.71  CALCIUM 9.3 8.2* 8.0*   Liver Function Tests: Recent Labs    06/17/17 1511 06/18/17 1625 06/25/17 1349  AST 23 31 30   ALT 15 17 17   ALKPHOS 52 55 55  BILITOT 0.4 0.4 0.8  PROT 6.1* 6.7 7.2  ALBUMIN 3.5 4.0 4.2   Recent Labs    06/18/17 1625 06/25/17 1349  LIPASE 33 36   No results for input(s): AMMONIA in the last 8760 hours. CBC: Recent Labs    09/20/17 0552 09/21/17 0355 09/24/17 0635    WBC 7.6 7.9 5.2  HGB 8.7* 8.8* 7.9*  HCT 26.3* 26.2* 24.5*  MCV 94.3 93.9  96.8  PLT 103* 103* 178   Cardiac Enzymes: Recent Labs    06/17/17 1511 06/25/17 1349  TROPONINI <0.03 <0.03   BNP: Invalid input(s): POCBNP Lab Results  Component Value Date   HGBA1C 5.9 (H) 07/27/2015   Lab Results  Component Value Date   TSH 0.368 (L) 08/18/2015   Lab Results  Component Value Date   VITAMINB12 1,782 (H) 07/12/2016   Lab Results  Component Value Date   FOLATE >19.9 06/23/2013   No results found for: IRON, TIBC, FERRITIN  Imaging and Procedures obtained prior to SNF admission: No results found.  Assessment/Plan Status post total replacement of left hip Had long d/w patient and her husband in the room. Will start her on Long acting OxyContin 10 mg BID And Oxycodone 10 mg Q 6 hour standing dose. She will continue PRN Flexeril Will taper her off Oxycodone in next few days. Patient is doing well and actually walking with the walker around. Continue on Xarelto for DVT prophylaxis. Follow up with Ortho   Anemia, Post op Will start her on Iron supplement and do iron studies. Repeat CBC   Opioid-induced constipation (OIC) On Miralax and Colace Patient is also on Bentyl for Spasms per gi Paln for outpatient Colonoscopy Amitiza has not worked before for her  Chronic pain syndrome She is on Cymbalta Has seen by Pain clinic on past. Will be careful with Narcotic as she has tendency to abuse.  Disposition Home with her husband Insomnia Unfortunately she is not agreeing for melatonin and needs good night rest for her therapy. Will continue Prn Ambien. But no prescription on discharge. Patient understands.   Family/ staff Communication:   Labs/tests ordered: BMP, CBC, Iron Studies, TSH  Total time spent in this patient care encounter was 45_ minutes; greater than 50% of the visit spent counseling patient, reviewing records , Labs and coordinating care for  problems addressed at this encounter.

## 2017-09-26 ENCOUNTER — Other Ambulatory Visit: Payer: Self-pay

## 2017-09-26 MED ORDER — OXYCODONE HCL 10 MG PO TABS: ORAL_TABLET | ORAL | 0 refills | Status: DC

## 2017-09-26 NOTE — Telephone Encounter (Signed)
RX Fax for [facility]@ [phone]  

## 2017-09-27 ENCOUNTER — Non-Acute Institutional Stay (SKILLED_NURSING_FACILITY): Payer: Medicare PPO | Admitting: Internal Medicine

## 2017-09-27 ENCOUNTER — Encounter: Payer: Self-pay | Admitting: Internal Medicine

## 2017-09-27 DIAGNOSIS — G894 Chronic pain syndrome: Secondary | ICD-10-CM | POA: Diagnosis not present

## 2017-09-27 DIAGNOSIS — Z96642 Presence of left artificial hip joint: Secondary | ICD-10-CM | POA: Diagnosis not present

## 2017-09-27 DIAGNOSIS — M25552 Pain in left hip: Secondary | ICD-10-CM

## 2017-09-27 DIAGNOSIS — D649 Anemia, unspecified: Secondary | ICD-10-CM

## 2017-09-27 NOTE — Progress Notes (Signed)
This is an acute visit.  Level care skilled.  Facility is [facility].  Chief complaint-acute visit secondary to left hip-groin discomfort.  History of present illness.  Patient is a pleasant 76 year old female seen today for complaints of some left groin hip discomfort.  She is status post left total hip arthroplasty on November 13- she also has a history of chronic pain syndrome with long-term opiate use multiple spinal surgeries-hyperlipidemia and constipation.  She is here for rehab after undergoing the arthroplasty secondary to unremitting pain in the left hip.  Initially when she was here she had significant pain complaints and Dr. Lyndel Safe did assess this she is currently on oxycodone controlled release 10 mg twice daily--as well as oxycodone 10 mg every 6 hours.  She also has an order for Flexeril 5 mg 3 times daily as needed.   She does not report any history of trauma.  Vital signs are stable  Past Medical History:  Diagnosis Date  . Anxiety   . Aortic aneurysm ([place]) 06/06/2016  . Cataract   . Chest pain 08/18/2015  . Complication of anesthesia    as a teenager pt. had surgery and surgeon told parents that she should never have pentathal ever again  . Constipation due to opioid therapy   . Decreased muscle strength 02/10/2014  . Depression   . Difficulty in walking(719.7) 02/10/2014  . Dysphagia 08/19/2015  . Dyspnea 08/19/2015  . GERD (gastroesophageal reflux disease)    occasionally    tums OTC  . History of kidney stones   . Hyperparathyroidism, primary ([place])    s/p parathyroidectomy  . Hyperventilation 08/18/2015  . Insomnia   . Intractable back pain 04/29/2014  . Kidney stone   . Lumbar spinal stenosis    s/p laminectomy  . Odynophagia 08/18/2015  . Osteoarthritis of both knees    s/p knee replacements  . Pain   . Rectal prolapse    s/p repair  . Unintentional weight loss 09/08/2015  . UTI (lower urinary tract infection) 04/28/2014          Past Surgical History:  Procedure Laterality Date  . ABDOMINAL HYSTERECTOMY    . APPENDECTOMY    . BACK SURGERY  07-2015    total 4 major back surgeries  . BREAST LUMPECTOMY     Reports this is benign  . COLONOSCOPY  2013  . JOINT REPLACEMENT    . PARATHYROIDECTOMY  1990's  . SPINE SURGERY    . TONSILLECTOMY    . TOTAL HIP ARTHROPLASTY Left 09/18/2017  . TOTAL HIP ARTHROPLASTY Left 09/18/2017   Procedure: LEFT TOTAL HIP ARTHROPLASTY ANTERIOR APPROACH;  Surgeon: Mcarthur Rossetti, MD;  Location: [facility];  Service: Orthopedics;  Laterality: Left;  . TOTAL KNEE ARTHROPLASTY Bilateral 1990's nd 2002    reports that she has been smoking cigarettes.  She has a 25.00 pack-year smoking history. she has never used smokeless tobacco. She reports that she does not drink alcohol or use drugs. Social History        Socioeconomic History  . Marital status: Married    Spouse name: Not on file  . Number of children: Not on file  . Years of education: Not on file  . Highest education level: Not on file  Social Needs  . Financial resource strain: Not on file  . Food insecurity - worry: Not on file  . Food insecurity - inability: Not on file  . Transportation needs - medical: Not on file  . Transportation  needs - non-medical: Not on file  Occupational History  . Not on file  Tobacco Use  . Smoking status: Current Every Day Smoker    Packs/day: 0.50    Years: 50.00    Pack years: 25.00    Types: Cigarettes  . Smokeless tobacco: Never Used  Substance and Sexual Activity  . Alcohol use: No    Alcohol/week: 0.0 oz  . Drug use: No  . Sexual activity: Not Currently  Other Topics Concern  . Not on file  Social History Narrative  . Not on file    Functional Status Survey:       Family History  Problem Relation Age of Onset  . Mental illness Mother   . COPD Father   . Heart failure Father   . Diabetes Son         Health  Maintenance  Topic Date Due  . Samul Dada  08/25/2022  . INFLUENZA VACCINE  Completed  . DEXA SCAN  Completed  . PNA vac Low Risk Adult  Completed         Allergies  Allergen Reactions  . Fentanyl Itching, Nausea And Vomiting, Rash and Other (See Comments)    Sweating, duragesic patch only; can tolerate IV, Itching.  . Nsaids Diarrhea, Nausea And Vomiting, Rash and Other (See Comments)    Abdominal cramping. "horrible cramps".   . Pentothal [Thiopental] Nausea And Vomiting, Rash and Other (See Comments)    UNSPECIFIED OTHER REACTION > Had as a child, and was told to never take again     Medications.  5 mg nightly as needed.  Flexeril 5 mg 3 times daily as needed.  Dicyclomine 20 mg every 6 hours as needed.  Colace 200 mg once a day.  Cymbalta 60 mg every morning.  Iron 325 mg every morning.  Melatonin 5 mg nightly.  MiraLAX daily.  Multivitamin daily.  Oxycodone 10 mg every 6 hours.  OxyContin controlled release 10 mg twice daily.  Probiotic twice daily.  Xarelto 10 mg every morning.   Review of systems.  She is not complaining of any fever chills.  Skin does not complain of rashes itching or diaphoresis.  Head ears eyes nose mouth and throat no complaints of sore throat or visual changes.  Respiratory does not complain of shortness of breath or cough.  Cardiac does not complain of any chest pain or significant lower extremity edema.  GI does not complain of abdominal discomfort nausea vomiting diarrhea constipation.  Musculoskeletal is complaining of some left groin discomfort apparently this is intermittent and more when she is ambulating.  Neurologic does not complain of dizziness headache syncope or numbness.  Psych does not complain of overt anxiety or depression.  Physical exam.  Temperature is 98.3 pulse 66 respirations 18 blood pressure 128/70.  In general this is a pleasant elderly female in no distress.  Her skin is  warm and dry.  There is dressing over the surgical site left hip I do not see any surrounding erythema drainage or bruising.  Eyes visual acuity appears grossly intact.  Oropharynx is clear mucous membranes moist.  Chest is clear to auscultation there is no labored breathing.   Heart is regular rate and rhythm without murmur gallop or rub--l she has quite minimal edema of her left leg but this is more actually thigh area is cool to touch  is is not tender or erythematous is not firm  Abdomen is soft nontender with active bowel sounds.  Musculoskeletal he is  ambulatory with a walker she does not really have significant lower extremity edema- I could not note any significant deformity of her left hip-there was some tenderness to palpation of her right inner thigh groin area but I cannot really appreciate any significant firmness erythema.--Or deformity-.  Neurologic grossly intact her speech is clear no lateralizing findings.  Labs.  September 24, 2017.  Sodium 139 potassium 3.5 BUN 9 creatinine 0.71.  WBC 5.2 hemoglobin 7.9 platelets 178.  Assessment plan.  History of left hip-groin discomfort-physical exam is fairly benign- will order an x-ray of the area also venous Doppler to rule out any DVT- as far as pain management will continue her on a long-acting OxyContin 10 mg twice daily and 10 mg of oxycodone every 6 hours-also has as needed Flexeril-would be hesitant to increase this since she is on significant pain management- at some point expectation is we will try to taper her oxycodone although with her pain complaints today this would be difficult-nursing staff has not noted any sedation but this will have to be watched.  She continues on Xarelto for DVT prophylaxis.  2.  Postop anemia she has been started on iron and this was started a couple days ago update CBC is pending for early next week.  Addendum.  The venous Doppler has been obtained-final results are pending but per  discussion with sonographer appears to be negative for DVT- again will await final results and obtain x-ray and continue pain management as noted above.  [id number] note greater than 25 minutes spent assessing patient-reviewing her chart-reviewing her labs- discussing her status with nursing staff as well as with sonographer- and coordinating a plan of care-

## 2017-09-28 ENCOUNTER — Other Ambulatory Visit: Payer: Self-pay

## 2017-09-28 MED ORDER — OXYCODONE HCL ER 10 MG PO T12A: 10.0000 mg | EXTENDED_RELEASE_TABLET | Freq: Two times a day (BID) | ORAL | 0 refills | Status: DC

## 2017-09-28 MED ORDER — OXYCODONE HCL 10 MG PO TABS: ORAL_TABLET | ORAL | 0 refills | Status: DC

## 2017-09-28 NOTE — Telephone Encounter (Signed)
RX Fax for [facility]@ [phone]  

## 2017-10-01 ENCOUNTER — Encounter (HOSPITAL_COMMUNITY)
Admission: RE | Admit: 2017-10-01 | Discharge: 2017-10-01 | Disposition: A | Discharge status: Home / Self Care | Payer: Medicare PPO | Source: Skilled Nursing Facility | Attending: Internal Medicine | Admitting: Internal Medicine

## 2017-10-01 LAB — BASIC METABOLIC PANEL
Anion gap: 9 (ref 5–15)
BUN: 18 mg/dL (ref 6–20)
CO2: 29 mmol/L (ref 22–32)
CREATININE: 0.82 mg/dL (ref 0.44–1.00)
Calcium: 9 mg/dL (ref 8.9–10.3)
Chloride: 103 mmol/L (ref 101–111)
GFR calc Af Amer: >60 mL/min (ref >60)
Glucose, Bld: 99 mg/dL (ref 65–99)
POTASSIUM: 4.5 mmol/L (ref 3.5–5.1)
SODIUM: 141 mmol/L (ref 135–145)

## 2017-10-01 LAB — RETICULOCYTES
RBC.: 3.27 MIL/uL — ABNORMAL LOW (ref 3.87–5.11)
RETIC COUNT ABSOLUTE: 150.4 10*3/uL (ref 19.0–186.0)
RETIC CT PCT: 4.6 % — AB (ref 0.4–3.1)

## 2017-10-01 LAB — CBC
HCT: 32.4 % — ABNORMAL LOW (ref 36.0–46.0)
Hemoglobin: 10.3 g/dL — ABNORMAL LOW (ref 12.0–15.0)
MCH: 31.5 pg (ref 26.0–34.0)
MCHC: 31.8 g/dL (ref 30.0–36.0)
MCV: 99.1 fL (ref 78.0–100.0)
PLATELETS: 450 10*3/uL — AB (ref 150–400)
RBC: 3.27 MIL/uL — AB (ref 3.87–5.11)
RDW: 14.3 % (ref 11.5–15.5)
WBC: 6.7 10*3/uL (ref 4.0–10.5)

## 2017-10-01 LAB — IRON AND TIBC
IRON: 45 ug/dL (ref 28–170)
Saturation Ratios: 13 % (ref 10.4–31.8)
TIBC: 344 ug/dL (ref 250–450)
UIBC: 299 ug/dL

## 2017-10-01 LAB — VITAMIN B12: VITAMIN B 12: 498 pg/mL (ref 180–914)

## 2017-10-01 LAB — FERRITIN: FERRITIN: 97 ng/mL (ref 11–307)

## 2017-10-01 LAB — TSH: TSH: 3.359 u[IU]/mL (ref 0.350–4.500)

## 2017-10-01 LAB — FOLATE: Folate: 20.6 ng/mL (ref >5.9)

## 2017-10-02 ENCOUNTER — Ambulatory Visit (INDEPENDENT_AMBULATORY_CARE_PROVIDER_SITE_OTHER): Payer: Medicare PPO | Admitting: Physician Assistant

## 2017-10-02 ENCOUNTER — Encounter (INDEPENDENT_AMBULATORY_CARE_PROVIDER_SITE_OTHER): Payer: Self-pay | Admitting: Orthopaedic Surgery

## 2017-10-02 DIAGNOSIS — Z96642 Presence of left artificial hip joint: Secondary | ICD-10-CM

## 2017-10-02 NOTE — Progress Notes (Signed)
Mrs. Garr returns today 2 weeks status post left total hip arthroplasty.  She states she continues to have pain in the left hip is similar to what she had prior to surgery.  She is at [facility] and working with physical therapy there.  She states after therapy that she has pain for 2 hours it is almost unbearable.  She is ambulating with a walker.  She is able to get on and off the exam table with minimal assistance.  She denies any fevers chills shortness of breath chest pain.   Physical exam: General well-developed well-nourished female in no acute distress.  Mood and Affect appropriate.  Left hip surgical incision healing well.  She has a small seroma.  Good range of motion of the left hip without significant pain.  Left calf supple and nontender dorsiflexion plantarflexion bilateral ankles intact.  Impression:Status post left total hip arthroplasty  Plan: Suggested that she stop working on strengthening mainly work on walking gait and balance for exercise.  Seroma was aspirated today after prep with Betadine 30 cc of seroma aspirate was obtained patient tolerated well.  She is able to shower and get the incision wet.  She will follow-up with [place] in a month sooner if there is any questions concerns.

## 2017-10-04 ENCOUNTER — Non-Acute Institutional Stay (SKILLED_NURSING_FACILITY): Payer: Medicare PPO | Admitting: Internal Medicine

## 2017-10-04 ENCOUNTER — Telehealth: Payer: Self-pay

## 2017-10-04 ENCOUNTER — Encounter: Payer: Self-pay | Admitting: Internal Medicine

## 2017-10-04 DIAGNOSIS — G894 Chronic pain syndrome: Secondary | ICD-10-CM | POA: Diagnosis not present

## 2017-10-04 DIAGNOSIS — D649 Anemia, unspecified: Secondary | ICD-10-CM

## 2017-10-04 DIAGNOSIS — T402X5A Adverse effect of other opioids, initial encounter: Secondary | ICD-10-CM | POA: Diagnosis not present

## 2017-10-04 DIAGNOSIS — K5903 Drug induced constipation: Secondary | ICD-10-CM | POA: Diagnosis not present

## 2017-10-04 DIAGNOSIS — Z96642 Presence of left artificial hip joint: Secondary | ICD-10-CM

## 2017-10-04 NOTE — Telephone Encounter (Signed)
Copied from [facility] [phone]. Topic: General - Other >> Oct 04, 2017  1:50 PM Marin Olp L wrote: Reason for CRM: Santiago Glad with [facility] calling for hospital f/u for patient discharged on 11/16. She's been in skilled nursing since 11/19 and will be leaving them on Saturday December 1st. No hospital f/u available w/ [facility] in the month of December. Please advise for work in.

## 2017-10-04 NOTE — Progress Notes (Signed)
Location:    [facility] Room Number: 158/P Place of Service:  SNF (31)  Provider: Caterina Racine,Kishawn Pickar  PCP: [name], MD Patient Care Team: [name], MD as PCP - General (Family Medicine) [name], MD (Family Medicine)  Extended Emergency Contact Information Primary Emergency Contact: Freda Jackson, [place] [address] [place] of [place] Work Phone: [phone] Mobile Phone: [phone] Relation: Son Secondary Emergency Contact: Gewirtz,John Address: [address] [place]          [place], [place] [address] [place] Phone: [phone] Relation: Spouse  Code Status: Full Code Goals of care:  Advanced Directive information Advanced Directives 10/04/2017  Does Patient Have a Medical Advance Directive? Yes  Type of Advance Directive (No Data)  Does patient want to make changes to medical advance directive? No - Patient declined  Copy of [place] in Chart? No - copy requested  Pre-existing out of facility DNR order (yellow form or pink MOST form) -     Allergies  Allergen Reactions  . Fentanyl Itching, Nausea And Vomiting, Rash and Other (See Comments)    Sweating, duragesic patch only; can tolerate IV, Itching.  . Nsaids Diarrhea, Nausea And Vomiting, Rash and Other (See Comments)    Abdominal cramping. "horrible cramps".   . Pentothal [Thiopental] Nausea And Vomiting, Rash and Other (See Comments)    UNSPECIFIED OTHER REACTION > Had as a child, and was told to never take again     Chief Complaint  Patient presents with  . Discharge Note    Discharge Visit    HPI:  76 y.o. female seen today for discharge from facility tomorrow.  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  She also has a history of chronic pain syndrome with long-term opiate use with a history of multiple surgeries-she has been trying to titrate her medications down currently on OxyContin 10 mg controlled release twice a day and as needed oxycodone every 4 hours as needed.--She continues on Cymbalta routinely  She also has Flexeril as needed.  Postop anemia with hemoglobin at one point .7.9 however this is risen up to 10.3 she has completed a course of iron  In regards to other medical issues appear to be stable - she also has a history of insomnia on melatonin as well as Ambien as needed-per previous discussion with Dr. Lyndel Safe to we will DC the Ambien upon discharge--Dr. Lyndel Safe did discuss this  with her previously.        Past Medical History:  Diagnosis Date  . Anxiety   . Aortic aneurysm ([place]) 06/06/2016  . Cataract   . Chest pain 08/18/2015  . Complication of anesthesia    as a teenager pt. had surgery and surgeon told parents that she should never have pentathal ever again  . Constipation due to opioid therapy   . Decreased muscle strength 02/10/2014  . Depression   . Difficulty in walking(719.7) 02/10/2014  . Dysphagia 08/19/2015  . Dyspnea 08/19/2015  . GERD (gastroesophageal reflux disease)    occasionally    tums OTC  . History of kidney stones   . Hyperparathyroidism, primary ([place])    s/p parathyroidectomy  . Hyperventilation 08/18/2015  . Insomnia   .  Intractable back pain 04/29/2014  . Kidney stone   . Lumbar spinal stenosis    s/p laminectomy  . Odynophagia 08/18/2015  . Osteoarthritis of both knees    s/p knee replacements  . Pain   . Rectal prolapse    s/p repair  . Unintentional weight loss 09/08/2015  . UTI (lower urinary tract infection) 04/28/2014    Past Surgical History:  Procedure Laterality Date  . ABDOMINAL HYSTERECTOMY    . APPENDECTOMY    . BACK SURGERY  07-2015    total 4 major back surgeries  . BREAST LUMPECTOMY     Reports  this is benign  . COLONOSCOPY  2013  . JOINT REPLACEMENT    . PARATHYROIDECTOMY  1990's  . SPINE SURGERY    . TONSILLECTOMY    . TOTAL HIP ARTHROPLASTY Left 09/18/2017  . TOTAL HIP ARTHROPLASTY Left 09/18/2017   Procedure: LEFT TOTAL HIP ARTHROPLASTY ANTERIOR APPROACH;  Surgeon: Mcarthur Rossetti, MD;  Location: [facility];  Service: Orthopedics;  Laterality: Left;  . TOTAL KNEE ARTHROPLASTY Bilateral 1990's nd 2002      reports that she has been smoking cigarettes.  She has a 25.00 pack-year smoking history. she has never used smokeless tobacco. She reports that she does not drink alcohol or use drugs. Social History   Socioeconomic History  . Marital status: Married    Spouse name: Not on file  . Number of children: Not on file  . Years of education: Not on file  . Highest education level: Not on file  Social Needs  . Financial resource strain: Not on file  . Food insecurity - worry: Not on file  . Food insecurity - inability: Not on file  . Transportation needs - medical: Not on file  . Transportation needs - non-medical: Not on file  Occupational History  . Not on file  Tobacco Use  . Smoking status: Current Every Day Smoker    Packs/day: 0.50    Years: 50.00    Pack years: 25.00    Types: Cigarettes  . Smokeless tobacco: Never Used  Substance and Sexual Activity  . Alcohol use: No    Alcohol/week: 0.0 oz  . Drug use: No  . Sexual activity: Not Currently  Other Topics Concern  . Not on file  Social History Narrative  . Not on file   Functional Status Survey:    Allergies  Allergen Reactions  . Fentanyl Itching, Nausea And Vomiting, Rash and Other (See Comments)    Sweating, duragesic patch only; can tolerate IV, Itching.  . Nsaids Diarrhea, Nausea And Vomiting, Rash and Other (See Comments)    Abdominal cramping. "horrible cramps".   . Pentothal [Thiopental] Nausea And Vomiting, Rash and Other (See Comments)    UNSPECIFIED OTHER REACTION > Had as a  child, and was told to never take again     Pertinent  Health Maintenance Due  Topic Date Due  . INFLUENZA VACCINE  Completed  . DEXA SCAN  Completed  . PNA vac Low Risk Adult  Completed    Medications: Outpatient Encounter Medications as of 10/04/2017  Medication Sig  . cyclobenzaprine (FLEXERIL) 5 MG tablet Take 1-2 tablets (5-10 mg total) 3 (three) times daily as needed by mouth for muscle spasms.  Marland Kitchen dicyclomine (BENTYL) 20 MG tablet Take 20 mg by mouth every 6 (six) hours as needed for spasms.   Marland Kitchen docusate sodium (COLACE) 100 MG capsule Take 200 mg by mouth daily.  . DULoxetine (CYMBALTA) 60  MG capsule TAKE 1 CAPSULE EVERY DAY  . Melatonin 5 MG CAPS Take 5 mg by mouth at bedtime.  . Multiple Vitamin (MULTIVITAMIN) tablet Take 1 tablet by mouth daily.  Marland Kitchen oxyCODONE (OXYCONTIN) 10 mg 12 hr tablet Take 1 tablet (10 mg total) by mouth every 12 (twelve) hours.  . Oxycodone HCl 10 MG TABS Take 1 tablet by mouth every 6 hours  . polyethylene glycol (MIRALAX / GLYCOLAX) packet Take 17 g by mouth daily. Mix one tablespoon with 8oz of your favorite juice or water every day until you are having soft formed stools. Then start taking once daily if you didn't have a stool the day before.  . Probiotic Product (ALIGN EXTRA STRENGTH PO) Take 70 capsules daily by mouth.   . rivaroxaban (XARELTO) 10 MG TABS tablet Take 1 tablet (10 mg total) daily with breakfast by mouth.  . zolpidem (AMBIEN) 5 MG tablet Take 1 tablet (5 mg total) at bedtime as needed by mouth for sleep.   No facility-administered encounter medications on file as of 10/04/2017.      Review of Systems  In general she is not complaining any fever chills.  Skin does not complain of rashes itching or diaphoresis surgical site appears to be benign she says she does have a history of cyst in that area as well will be asking orthopedics about that-again seroma was aspirated couple days ago.  Head ears eyes nose mouth and throat no  complaints visual changes or sore throat.  Respiratory is not complaining of any shortness of breath or cough.  Cardiac does not complaint of chest pain or significant lower extremity edema.  GI is not complaining of any abdominal discomfort nausea vomiting diarrhea constipation at this time.  Musculoskeletal at times still complains of diffuse joint pain more so on her left hip she is trying to titrate down her opiate use-which we have encouraged.  Neurologic does not complain of dizziness headache numbness or syncope at this time.  Psych not complaining of overt anxiety or depression.        She is afebrile pulse 92 respirations 20 blood pressure taken manually 120/72 weight is 124 lbs Physical Exam   General this is a pleasant elderly female in no distress.  Her skin is warm and dry there are Steri-Strips over her left hip surgical site wound edges appear well approximated without sign of infection drainage or bleeding-she does have a small covering over the aspiration site.  Eyes visual acuity appears grossly intact sclera and conjunctive are clear.  Oropharynx is clear mucous membranes moist.  Chest is clear to auscultation there is no labored breathing.  Heart is regular rate and rhythm without murmur gallop or rub she has minimal lower extremity edema of legs bilaterally-pedal pulses are palpable bilaterally.  Abdomen soft nontender with active bowel sounds.  Musculoskeletal -- is ambulatory with a walker and appears to be doing well with this strength appears to be intact all 4 extremities   Neurologic-is grossly intact her speech is clear no lateralizing findings.  Psych she is alert and oriented pleasant and appropriate   Labs reviewed: Basic Metabolic Panel: Recent Labs    09/19/17 0544 09/24/17 0635 10/01/17 0300  NA 135 139 141  K 3.8 3.5 4.5  CL 102 101 103  CO2 28 31 29   GLUCOSE 113* 100* 99  BUN 8 9 18   CREATININE 0.77 0.71 0.82  CALCIUM 8.2*  8.0* 9.0   Liver Function Tests: Recent Labs  06/17/17 1511 06/18/17 1625 06/25/17 1349  AST 23 31 30   ALT 15 17 17   ALKPHOS 52 55 55  BILITOT 0.4 0.4 0.8  PROT 6.1* 6.7 7.2  ALBUMIN 3.5 4.0 4.2   Recent Labs    06/18/17 1625 06/25/17 1349  LIPASE 33 36   No results for input(s): AMMONIA in the last 8760 hours. CBC: Recent Labs    09/21/17 0355 09/24/17 0635 10/01/17 0300  WBC 7.9 5.2 6.7  HGB 8.8* 7.9* 10.3*  HCT 26.2* 24.5* 32.4*  MCV 93.9 96.8 99.1  PLT 103* 178 450*   Cardiac Enzymes: Recent Labs    06/17/17 1511 06/25/17 1349  TROPONINI <0.03 <0.03   BNP: Invalid input(s): POCBNP CBG: Recent Labs    06/17/17 1605  GLUCAP 94    Procedures and Imaging Studies During Stay: Dg Pelvis Portable  Result Date: 09/18/2017 CLINICAL DATA:  Status post left hip replacement EXAM: PORTABLE PELVIS 1-2 VIEWS COMPARISON:  04/25/2017 FINDINGS: Mild arthritis of the right hip. Calcified phleboliths in the pelvis. Sclerosis and osteophyte at the pubic symphysis. Status post left hip replacement with normal alignment. Mild soft tissue gas. New irregularity an adjacent soft tissue calcification at the left anterior superior iliac spine. IMPRESSION: 1. Status post left hip replacement with normal alignments. Soft tissue at the proximal left thigh consistent with recent surgical status. 2. Calcification or questionable bone fragments projecting over the lateral soft tissues of the proximal thigh a 3. New irregularity and soft tissue calcification adjacent to the left anterior superior iliac spine. Electronically Signed   By: Donavan Foil M.D.   On: 09/18/2017 19:18   Dg C-arm 1-60 Min  Result Date: 09/18/2017 CLINICAL DATA: Left hip arthroplasty. EXAM: DG C-ARM 61-120 MIN; OPERATIVE LEFT HIP WITH PELVIS COMPARISON:  08/27/2017 . FINDINGS: Total left hip replacement. Anatomic alignment. No acute bony abnormality. IMPRESSION: Total left hip replacement.  No acute abnormality  . Electronically Signed   By: Marcello Moores  Register   On: 09/18/2017 16:47   Dg Hip Operative Unilat W Or W/o Pelvis Left  Result Date: 09/18/2017 CLINICAL DATA: Left hip arthroplasty. EXAM: DG C-ARM 61-120 MIN; OPERATIVE LEFT HIP WITH PELVIS COMPARISON:  08/27/2017 . FINDINGS: Total left hip replacement. Anatomic alignment. No acute bony abnormality. IMPRESSION: Total left hip replacement.  No acute abnormality . Electronically Signed   By: Marcello Moores  Register   On: 09/18/2017 16:47   Xr Knee 1-2 Views Left  Result Date: 09/05/2017 AP and lateral views of the left knee: No acute fracture.  Knee is well located.  Status post left total knee arthroplasty without any signs of loosening or hardware failure.   Assessment/Plan:     #1-history of left total hip replacement secondary to osteoarthritis-she is doing well now walking with a walker would benefit from continued PT and OT-as noted above she is trying to titrate down her narcotics she is currently on OxyContin controlled release 10 mg twice daily will reduce her as needed oxycodone 10 mg to twice daily as needed upon discharge she will be followed by her primary care provider as well as orthopedics.  Of note she also continues on Cymbalta  She continues on Xarelto for anticoagulation DVT prophylaxis-she also has Flexeril as needed.  2.  Postop anemia she has completed a short course of iron and hemoglobin has improved significantly up to 10.3 will warrant follow-up by primary care provider B12 folate iron levels were fairly unremarkable iron was borderline low and again she did receive some  supplementation-hemoglobin has risen appreciably again will  for primary care follow-up.  3.  History of insomnia as previously discussed will leave her on the melatonin we will DC the Ambien upon discharge-.  4.  History of chronic pain syndrome as noted above we are trying to reduce her narcotics this will need follow-up by primary care provider.--Of note  she is on Cymbalta routinely    Again she will be going home she will be with her husband he will need home health for her multiple medical issues to follow as well as orthopedic follow-up and primary care provider for follow-up-also will need PT and OT for further strengthening.   will discharge her with 15 tablets of the long-acting OxyContin as well as 15 tablets of the oxycodone as well-with follow-up by primary care provider and orthopedics.  [id number] note greater than 30 minutes spent on this discharge summary- greater than 50% of time spent coordinating a plan of care for numerous diagnoses 5.  History of constipation this appears to be stable she is on MiraLAX as well as docusate daily.

## 2017-10-08 ENCOUNTER — Telehealth (INDEPENDENT_AMBULATORY_CARE_PROVIDER_SITE_OTHER): Payer: Self-pay | Admitting: Orthopaedic Surgery

## 2017-10-08 ENCOUNTER — Other Ambulatory Visit: Payer: Self-pay

## 2017-10-08 ENCOUNTER — Encounter: Payer: Self-pay | Admitting: Nurse Practitioner

## 2017-10-08 ENCOUNTER — Ambulatory Visit: Payer: Medicare PPO | Attending: Nurse Practitioner | Admitting: Nurse Practitioner

## 2017-10-08 VITALS — BP 135/81 | HR 108 | Temp 98.2°F | Resp 18 | Ht 60.0 in | Wt 117.0 lb

## 2017-10-08 DIAGNOSIS — G8929 Other chronic pain: Secondary | ICD-10-CM

## 2017-10-08 DIAGNOSIS — M48061 Spinal stenosis, lumbar region without neurogenic claudication: Secondary | ICD-10-CM | POA: Diagnosis not present

## 2017-10-08 DIAGNOSIS — G894 Chronic pain syndrome: Secondary | ICD-10-CM | POA: Diagnosis not present

## 2017-10-08 DIAGNOSIS — M1610 Unilateral primary osteoarthritis, unspecified hip: Secondary | ICD-10-CM | POA: Diagnosis not present

## 2017-10-08 DIAGNOSIS — Z79891 Long term (current) use of opiate analgesic: Secondary | ICD-10-CM | POA: Insufficient documentation

## 2017-10-08 DIAGNOSIS — F329 Major depressive disorder, single episode, unspecified: Secondary | ICD-10-CM | POA: Diagnosis not present

## 2017-10-08 DIAGNOSIS — M5126 Other intervertebral disc displacement, lumbar region: Secondary | ICD-10-CM | POA: Diagnosis not present

## 2017-10-08 DIAGNOSIS — M961 Postlaminectomy syndrome, not elsewhere classified: Secondary | ICD-10-CM | POA: Diagnosis not present

## 2017-10-08 DIAGNOSIS — K5900 Constipation, unspecified: Secondary | ICD-10-CM | POA: Diagnosis not present

## 2017-10-08 DIAGNOSIS — R251 Tremor, unspecified: Secondary | ICD-10-CM | POA: Diagnosis not present

## 2017-10-08 DIAGNOSIS — Z79899 Other long term (current) drug therapy: Secondary | ICD-10-CM | POA: Diagnosis not present

## 2017-10-08 DIAGNOSIS — Z96653 Presence of artificial knee joint, bilateral: Secondary | ICD-10-CM | POA: Diagnosis not present

## 2017-10-08 DIAGNOSIS — M25552 Pain in left hip: Secondary | ICD-10-CM | POA: Insufficient documentation

## 2017-10-08 DIAGNOSIS — G8918 Other acute postprocedural pain: Secondary | ICD-10-CM | POA: Insufficient documentation

## 2017-10-08 DIAGNOSIS — M545 Low back pain: Secondary | ICD-10-CM | POA: Insufficient documentation

## 2017-10-08 DIAGNOSIS — Z87442 Personal history of urinary calculi: Secondary | ICD-10-CM | POA: Insufficient documentation

## 2017-10-08 DIAGNOSIS — N390 Urinary tract infection, site not specified: Secondary | ICD-10-CM | POA: Insufficient documentation

## 2017-10-08 DIAGNOSIS — K623 Rectal prolapse: Secondary | ICD-10-CM | POA: Insufficient documentation

## 2017-10-08 DIAGNOSIS — Z6830 Body mass index (BMI) 30.0-30.9, adult: Secondary | ICD-10-CM | POA: Insufficient documentation

## 2017-10-08 DIAGNOSIS — D649 Anemia, unspecified: Secondary | ICD-10-CM | POA: Diagnosis not present

## 2017-10-08 DIAGNOSIS — R079 Chest pain, unspecified: Secondary | ICD-10-CM | POA: Insufficient documentation

## 2017-10-08 DIAGNOSIS — R1310 Dysphagia, unspecified: Secondary | ICD-10-CM | POA: Insufficient documentation

## 2017-10-08 DIAGNOSIS — E669 Obesity, unspecified: Secondary | ICD-10-CM | POA: Insufficient documentation

## 2017-10-08 DIAGNOSIS — M7918 Myalgia, other site: Secondary | ICD-10-CM | POA: Diagnosis not present

## 2017-10-08 DIAGNOSIS — Z981 Arthrodesis status: Secondary | ICD-10-CM | POA: Insufficient documentation

## 2017-10-08 DIAGNOSIS — R1032 Left lower quadrant pain: Secondary | ICD-10-CM | POA: Diagnosis not present

## 2017-10-08 DIAGNOSIS — F419 Anxiety disorder, unspecified: Secondary | ICD-10-CM | POA: Insufficient documentation

## 2017-10-08 DIAGNOSIS — K219 Gastro-esophageal reflux disease without esophagitis: Secondary | ICD-10-CM | POA: Insufficient documentation

## 2017-10-08 DIAGNOSIS — E785 Hyperlipidemia, unspecified: Secondary | ICD-10-CM | POA: Diagnosis not present

## 2017-10-08 NOTE — Progress Notes (Signed)
S 

## 2017-10-08 NOTE — Telephone Encounter (Signed)
Working her in Wednesday

## 2017-10-08 NOTE — Progress Notes (Signed)
Nursing Pain Medication Assessment:  Safety precautions to be maintained throughout the outpatient stay will include: orient to surroundings, keep bed in low position, maintain call bell within reach at all times, provide assistance with transfer out of bed and ambulation.  Medication Inspection Compliance: Pill count conducted under aseptic conditions, in front of the patient. Neither the pills nor the bottle was removed from the patient's sight at any time. Once count was completed pills were immediately returned to the patient in their original bottle.  Medication #1: Oxycodone IR Pill/Patch Count: 11 of 15 pills remain Pill/Patch Appearance: Markings consistent with prescribed medication Bottle Appearance: Standard pharmacy container. Clearly labeled. Filled Date: 12/01 / 2018 Last Medication intake:  Today  Medication #2: Oxycodone IR Pill/Patch Count: 50 of 120 pills remain Pill/Patch Appearance: Markings consistent with prescribed medication Bottle Appearance: Standard pharmacy container. Clearly labeled. Filled Date: 10/31 / 2018 Last Medication intake:  Today

## 2017-10-08 NOTE — Telephone Encounter (Signed)
Pt is having a lot of pain. Pt stated the pain management center discharged with the wrong med's. Pt is having trouble with her pain level.

## 2017-10-08 NOTE — Progress Notes (Signed)
Nursing Pain Medication Assessment:  Safety precautions to be maintained throughout the outpatient stay will include: orient to surroundings, keep bed in low position, maintain call bell within reach at all times, provide assistance with transfer out of bed and ambulation.  Medication Inspection Compliance: Pill count conducted under aseptic conditions, in front of the patient. Neither the pills nor the bottle was removed from the patient's sight at any time. Once count was completed pills were immediately returned to the patient in their original bottle.  Medication: Oxycodone ER (OxyContin) Pill/Patch Count: 11 of 15 pills remain Pill/Patch Appearance: Markings consistent with prescribed medication Bottle Appearance: Standard pharmacy container. Clearly labeled. Filled Date: 12/01/ 2018 Last Medication intake:  Today   Wasted Oxycontin #15 tabs in toilet, witnessed by patient and by Hart Rochester, RN.

## 2017-10-08 NOTE — Telephone Encounter (Signed)
Patient was returning your call from last week, if you could give her a call back at [phone]

## 2017-10-08 NOTE — Telephone Encounter (Signed)
Patient is not TCM she just need  A regular FU scheduled with Dr. Caryl Bis

## 2017-10-08 NOTE — Patient Instructions (Signed)

## 2017-10-08 NOTE — Progress Notes (Signed)
Patient's Name: Brittany Mays  MRN: [id number]  Referring Provider: [name], MD  DOB: 1941/04/27  PCP: [name], MD  DOS: 10/08/2017  Note by: Vevelyn Francois NP  Service setting: Ambulatory outpatient  Specialty: Interventional Pain Management  Location: [facility] (AMB) Pain Management Facility    Patient type: Established    Primary Reason(s) for Visit: Evaluation of chronic illnesses with exacerbation, or progression (Level of risk: moderate) CC: Hip Pain (left)  HPI  Brittany Mays is a 76 y.o. year old, female patient, who comes today for a follow-up evaluation. She has Anxiety and depression; Obesity (BMI 30-39.9); Lumbar spinal stenosis; History of lumbar fusion (T10 through L3 fusion); Generalized weakness; Mechanical complication of internal fixation device such as nail, plate or rod ([place]); Pseudarthrosis after fusion or arthrodesis; Solitary pulmonary nodule; Chronic hip pain (Location of Secondary source of pain) (Left); Hyperlipidemia; Aortic aneurysm ([place]); Osteoarthritis, multiple sites; Chronic low back pain (Location of Primary Source of Pain) (Bilateral) (R>L); Failed back surgical syndrome (x4); Chronic knee pain (Bilateral) (L>R); Long term current use of opiate analgesic; Long term prescription opiate use; Opiate use (60 MME/Day); Encounter for pain management planning; Encounter for therapeutic drug level monitoring; Abnormal CT scan, lumbar spine (05/07/2015); Osteoarthritis of sacroiliac joint (Bilateral); Lumbar facet arthropathy ([place]); Lumbar spondylolisthesis; Lumbar foraminal stenosis (Bilateral); History of total bilateral knee replacement; Opioid-induced constipation ([facility]); Chronic groin pain (Location of Tertiary source of pain) (Left); Postlaminectomy syndrome of lumbar region; Lumbar facet syndrome (Bilateral) (R>L); Pseudarthrosis following spinal fusion; Lumbar spondylosis; Chronic upper back pain (midline); Chronic pain syndrome; Tremor; Musculoskeletal pain;  Acute postoperative pain; Osteoarthritis of hip (Left); Osteoarthritis of ankle or foot; Trochanteric bursitis of left hip; Osteoarthritis of hip  (Bilateral) (L>R); Status post total replacement of left hip; and Anemia on their problem list. Brittany Mays was last seen on 09/03/2017. Her primarily concern today is the Hip Pain (left)  Pain Assessment: Location: Left Hip Radiating: left upper leg Onset:   Duration: Chronic pain Quality: Sharp, Aching Severity: 7 /10 (self-reported pain score)  Note: Reported level is compatible with observation.                         When using our objective Pain Scale, levels between 6 and 10/10 are said to belong in an emergency room, as it progressively worsens from a 6/10, described as severely limiting, requiring emergency care not usually available at an outpatient pain management facility. At a 6/10 level, communication becomes difficult and requires great effort. Assistance to reach the emergency department may be required. Facial flushing and profuse sweating along with potentially dangerous increases in heart rate and blood pressure will be evident. Effect on ADL:   Timing: Constant Modifying factors: changing position  Further details on both, my assessment(s), as well as the proposed treatment plan, please see below. She states that she had been given Oxycontin 10 mg BID and Oxycodone 10 mg BID for her hip. She states that she was wean off. She state that this is for 15 days. She states that she wanted to wean off the Oxycontin. She states that she was discharged and went home. She states that [place] and [employer]. She states that she has not heard back from the facility or staff today.  She admits that she has taken Oxycontin 33m today and Oxycodone 138mTID on yesterday. She states that she was not aware that she was going to be placed on  the Oxycontin.  She does not feel like she is having in adverse effects of the pain medication. She admits  that she is having pain control with the current regimen.  I clarified that her concern is the use of the OxyContin. She states that she does not want and does not need it. She does have medication from before her hip from our office that she has not taken secondary to being in rehab. She  Laboratory Chemistry  Inflammation Markers (CRP: Acute Phase) (ESR: Chronic Phase) Lab Results  Component Value Date   CRP 0.6 07/12/2016   ESRSEDRATE 6 07/12/2016                 Rheumatology Markers No results found for: Elayne Guerin, [facility]              Renal Function Markers Lab Results  Component Value Date   BUN 18 10/01/2017   CREATININE 0.82 10/01/2017   GFRAA >60 10/01/2017   GFRNONAA >60 10/01/2017                 Hepatic Function Markers Lab Results  Component Value Date   AST 30 06/25/2017   ALT 17 06/25/2017   ALBUMIN 4.2 06/25/2017   ALKPHOS 55 06/25/2017   HCVAB NEGATIVE 09/07/2015   LIPASE 36 06/25/2017                 Electrolytes Lab Results  Component Value Date   NA 141 10/01/2017   K 4.5 10/01/2017   CL 103 10/01/2017   CALCIUM 9.0 10/01/2017   MG 2.0 07/12/2016                 Neuropathy Markers Lab Results  Component Value Date   VITAMINB12 498 10/01/2017   FOLATE 20.6 10/01/2017   HGBA1C 5.9 (H) 07/27/2015   HIV NONREACTIVE 09/07/2015                 Bone Pathology Markers Lab Results  Component Value Date   25OHVITD1 35 07/12/2016   25OHVITD2 <1.0 07/12/2016   25OHVITD3 35 07/12/2016                 Coagulation Parameters Lab Results  Component Value Date   PLT 450 (H) 10/01/2017                 Cardiovascular Markers Lab Results  Component Value Date   TROPONINI <0.03 06/25/2017   HGB 10.3 (L) 10/01/2017   HCT 32.4 (L) 10/01/2017                 CA Markers No results found for: CEA, CA125, LABCA2               Note: Lab results reviewed.  Recent Diagnostic Imaging Review  Thoracic  Imaging:  Thoracic MR w/wo contrast:  Results for orders placed during the hospital encounter of 04/28/14  MR Thoracic Spine W Wo Contrast   Narrative CLINICAL DATA:  Thoracic and lumbar spine surgery on 06/15. Worsening pain. Altered mental status.  EXAM: MRI THORACIC AND LUMBAR SPINE WITHOUT AND WITH CONTRAST  TECHNIQUE: Multiplanar and multiecho pulse sequences of the thoracic and lumbar spine were obtained without and with intravenous contrast.  CONTRAST:  10m MULTIHANCE GADOBENATE DIMEGLUMINE 529 MG/ML IV SOLN  COMPARISON:  Lumbar spine MRI 02/27/2014  FINDINGS: Images are mildly two moderately degraded by motion artifact.  Thoracic vertebral alignment is normal. Thoracic vertebral body heights are preserved. There  is mild to moderate disc space narrowing with associated degenerative marrow changes from T4-T9. The thoracic spinal cord is normal in caliber and signal although is partially obscured by susceptibility artifact in the lower thoracic spine. A small amount of patchy signal abnormality is noted in the dependent right lung, incompletely evaluated but may reflect subsegmental atelectasis.  Patient's prior posterior lumbar fusion has been extended proximally since the prior MRI, with bilateral pedicle screws now present from T10-L3. Superficial gas and fluid collection along the surgical incision measures approximately 12 cm in craniocaudal length and has greatest axial measurements of 2.4 x 2.6 cm at the T11 level. There is mild surrounding enhancement. Fluid collection at the L1-2 laminectomy site measures 6.7 x 2.4 cm (transverse by AP). Small dorsal epidural fluid collection/hematoma at the L1-2 level measures 15 x 6 mm (series 11, image 26). This fluid collection and a circumferential L1-2 disc bulge result in moderate spinal stenosis. A small amount of dorsal epidural fluid/hematoma at L2-3 measures 10 x 5 mm without stenosis.  Evaluation of the spinal  canal in the lower thoracic spine is limited by susceptibility artifact from fixation hardware. The right T12 pedicle screw appears to traverse the right lateral recess. The conus medullaris terminates at L1.  Grade 1 anterolisthesis of L3 on L4 and L4 on L5 is unchanged. Sequelae of prior laminectomies are again identified from L2-3 to L4-5. Mild lateral recess narrowing at L4-5 is unchanged. L5-S1 interbody fusion is noted. There is mild bilateral neural foraminal narrowing at L5-S1, right greater than left.  IMPRESSION: 1. Interval thoracolumbar posterior fusion. 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 2. Fluid collections at the L1-2 laminectomy site and superficially along the surgical incision, which most likely represent postoperative hematoma/seromas, although superimposed infection cannot be excluded. 3. The right T12 pedicle screw appears to traverse the right lateral recess.   Electronically Signed   By: Logan Bores   On: 04/28/2014 20:19    Results for orders placed in visit on 07/20/16  CT THORACIC SPINE WO CONTRAST    Lumbosacral Imaging: Lumbar MR wo contrast:  Results for orders placed during the hospital encounter of 02/27/14  MR Lumbar Spine Wo Contrast   Narrative CLINICAL DATA:  LBP Severe low back pain radiating into the right hip and gluteal region for 3 weeks. History of prior spinal fusion in 2010.  EXAM: MRI LUMBAR SPINE WITHOUT CONTRAST  TECHNIQUE: Multiplanar, multisequence MR imaging was performed. No intravenous contrast was administered.  COMPARISON:  MR L SPINE W/O dated 03/16/2008  FINDINGS: Numbering used on prior exam is preserved. Vertebral body height is within normal limits. L5-S1 solid fusion. Spinal cord terminates posterior to the L1 vertebra. grade I anterolisthesis of L3 on L4 measures 6 mm. 3 mm of anterolisthesis of L4 on L5.  Progressive degenerative disease is present. L2-L3 rod and screw fixation. Paraspinal soft tissues appear within normal limits. Respiratory motion artifact is present on the axial images. There is some clumping of nerve roots at the L3-L4 level compatible with focal arachnoiditis. Lower thoracic levels show disc degeneration but no stenosis.  L1-L2: Severe central stenosis. This is compatible with adjacent segment disease. The disc is severely degenerated and there is a broad-based posterior protrusion. Additionally, posterior ligamentum flavum redundancy is present. Narrowing of both lateral recesses. Mild bilateral foraminal stenosis.  L2-L3: Laminectomy.  Central canal and foramina appear decompressed.  L3-L4: Laminectomy. Central canal decompressed. Foramina  patent. There appears to be solid posterior lateral fusion.  L4-L5: Laminectomy. Mild residual/ recurrent central stenosis. Left lateral recess encroachment potentially affecting the descending left L5 nerve. Facet joints appear to remain open. Mild uncoverage of the disc contributes to the central stenosis. Mild left foraminal stenosis secondary to bulging disc and anterolisthesis.  L5-S1: Solid fusion.  No stenosis.  IMPRESSION: 1. Interval L2-L3 partial discectomy and rod and pedicle screw fixation. No recurrent stenosis. 2. 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 3. L3-L4 laminectomy without recurrent stenosis. Probable solid posterior lateral fusion. L5-S1 solid fusion. 4. L4-L5 unchanged degenerative disease with mild left lateral recess and left foraminal stenosis.   Electronically Signed   By: Dereck Ligas M.D.   On: 02/27/2014 18:33    Lumbar MR w/wo contrast:  Results for orders placed during the hospital encounter of 04/28/14  MR Lumbar Spine W Wo Contrast   Narrative CLINICAL DATA:  Thoracic and  lumbar spine surgery on 06/15. Worsening pain. Altered mental status.  EXAM: MRI THORACIC AND LUMBAR SPINE WITHOUT AND WITH CONTRAST  TECHNIQUE: Multiplanar and multiecho pulse sequences of the thoracic and lumbar spine were obtained without and with intravenous contrast.  CONTRAST:  25m MULTIHANCE GADOBENATE DIMEGLUMINE 529 MG/ML IV SOLN  COMPARISON:  Lumbar spine MRI 02/27/2014  FINDINGS: Images are mildly two moderately degraded by motion artifact.  Thoracic vertebral alignment is normal. Thoracic vertebral body heights are preserved. There is mild to moderate disc space narrowing with associated degenerative marrow changes from T4-T9. The thoracic spinal cord is normal in caliber and signal although is partially obscured by susceptibility artifact in the lower thoracic spine. A small amount of patchy signal abnormality is noted in the dependent right lung, incompletely evaluated but may reflect subsegmental atelectasis.  Patient's prior posterior lumbar fusion has been extended proximally since the prior MRI, with bilateral pedicle screws now present from T10-L3. Superficial gas and fluid collection along the surgical incision measures approximately 12 cm in craniocaudal length and has greatest axial measurements of 2.4 x 2.6 cm at the T11 level. There is mild surrounding enhancement. Fluid collection at the L1-2 laminectomy site measures 6.7 x 2.4 cm (transverse by AP). Small dorsal epidural fluid collection/hematoma at the L1-2 level measures 15 x 6 mm (series 11, image 26). This fluid collection and a circumferential L1-2 disc bulge result in moderate spinal stenosis. A small amount of dorsal epidural fluid/hematoma at L2-3 measures 10 x 5 mm without stenosis.  Evaluation of the spinal canal in the lower thoracic spine is limited by susceptibility artifact from fixation hardware. The right T12 pedicle screw appears to traverse the right lateral recess. The conus  medullaris terminates at L1.  Grade 1 anterolisthesis of L3 on L4 and L4 on L5 is unchanged. Sequelae of prior laminectomies are again identified from L2-3 to L4-5. Mild lateral recess narrowing at L4-5 is unchanged. L5-S1 interbody fusion is noted. There is mild bilateral neural foraminal narrowing at L5-S1, right greater than left.  IMPRESSION: 1. Interval thoracolumbar posterior fusion. 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 2. Fluid collections at the L1-2 laminectomy site and superficially along the surgical incision, which most likely represent postoperative hematoma/seromas, although superimposed infection cannot be excluded. 3. The right T12 pedicle screw appears to traverse the right lateral recess.   Electronically Signed   By: ALogan Bores  On: 04/28/2014 20:19    Lumbar CT wo contrast:  Results for orders placed in visit on 07/20/16  CT LUMBAR SPINE WO CONTRAST  Lumbar DG Epidurogram OP:  Results for orders placed during the hospital encounter of 05/14/08  DG Epidurography   Narrative Clinical Data:  Better relief from the initial injection than the follow-up, which was at  a higher level.  She is having some recurrence of symptoms and elects to repeat.   Procedure: The procedure, risks, benefits, and alternatives were explained to the patient. Questions regarding the procedure were encouraged and answered. The patient understands and consents to the procedure.   LUMBAR EPIDURAL INJECTION: An interlaminar approach was performed on the right at L5-S1.  The overlying skin was cleansed and anesthetized.  A 20 gauge Crawford epidural needle was advanced using loss-of-resistance technique.   DIAGNOSTIC EPIDURAL INJECTION: Injection of Omnipaque 180 shows a good epidural pattern with spread above and below the level of needle placement. No vascular opacification is seen.    THERAPEUTIC EPIDURAL INJECTION: 183m of Depo-Medrol mixed with 55mlidocaine 1% were instilled.  The procedure was well-tolerated, and the patient was discharged thirty minutes following the injection in good condition.   Fluoroscopy Time: 22 seconds   Complications: none   IMPRESSION: Technically successful epidural injection on the right L5-S1.  Provider: KiCandis Schatz  Spine Imaging:  Epidurography 1:  Results for orders placed during the hospital encounter of 05/14/08  DG Epidurography   Narrative Clinical Data:  Better relief from the initial injection than the follow-up, which was at  a higher level.  She is having some recurrence of symptoms and elects to repeat.   Procedure: The procedure, risks, benefits, and alternatives were explained to the patient. Questions regarding the procedure were encouraged and answered. The patient understands and consents to the procedure.   LUMBAR EPIDURAL INJECTION: An interlaminar approach was performed on the right at L5-S1.  The overlying skin was cleansed and anesthetized.  A 20 gauge Crawford epidural needle was advanced using loss-of-resistance technique.   DIAGNOSTIC EPIDURAL INJECTION: Injection of Omnipaque 180 shows a good epidural pattern with spread above and below the level of needle placement. No vascular opacification is seen.   THERAPEUTIC EPIDURAL INJECTION: 12029mf Depo-Medrol mixed with 5ml85mdocaine 1% were instilled.  The procedure was well-tolerated, and the patient was discharged thirty minutes following the injection in good condition.   Fluoroscopy Time: 22 seconds   Complications: none   IMPRESSION: Technically successful epidural injection on the right L5-S1.  Provider: KimbCandis SchatzHip Imaging:  Hip-L MR wo contrast:  Results for orders placed during the hospital encounter of 04/11/15  MR Hip Left Wo Contrast   Narrative CLINICAL DATA:  Two-month history of left groin  pain.  EXAM: MR OF THE LEFT HIP WITHOUT CONTRAST  TECHNIQUE: Multiplanar, multisequence MR imaging was performed. No intravenous contrast was administered.  COMPARISON:  Left hip radiographs 12/29/2013  FINDINGS: Both hips are normally located. There are advanced hip joint degenerative changes on the left side and moderate degenerative changes on the right side. There is joint space narrowing, osteophytic spurring and subchondral cystic change on the left. No joint effusion, stress fracture or AVN.  There are degenerative changes involving the pubic symphysis and SI joints but no pelvic fractures or bone lesion. Advanced degenerative changes are noted in the lower lumbar spine.  The surrounding hip and pelvic musculature demonstrate normal signal intensity. No muscle tear, myositis or  muscle lesion.  No significant intrapelvic abnormalities are identified. No inguinal mass or adenopathy.  IMPRESSION: Advanced left hip joint degenerative changes but no stress fracture or AVN.   Electronically Signed   By: Marijo Sanes M.D.   On: 04/11/2015 13:56    Complexity Note: Imaging results reviewed. Results shared with Ms. Weis, using Layman's terms.                         Meds   Current Outpatient Medications:  .  cyclobenzaprine (FLEXERIL) 5 MG tablet, Take 1-2 tablets (5-10 mg total) 3 (three) times daily as needed by mouth for muscle spasms., Disp: 60 tablet, Rfl: 0 .  dicyclomine (BENTYL) 20 MG tablet, Take 20 mg by mouth every 6 (six) hours as needed for spasms. , Disp: , Rfl:  .  docusate sodium (COLACE) 100 MG capsule, Take 200 mg by mouth daily., Disp: , Rfl:  .  DULoxetine (CYMBALTA) 60 MG capsule, TAKE 1 CAPSULE EVERY DAY, Disp: 90 capsule, Rfl: 3 .  Melatonin 5 MG CAPS, Take 5 mg by mouth at bedtime., Disp: , Rfl:  .  Multiple Vitamin (MULTIVITAMIN) tablet, Take 1 tablet by mouth daily., Disp: , Rfl:  .  oxyCODONE (OXYCONTIN) 10 mg 12 hr tablet, Take 1 tablet  (10 mg total) by mouth every 12 (twelve) hours., Disp: 60 tablet, Rfl: 0 .  Oxycodone HCl 10 MG TABS, Take 1 tablet by mouth every 6 hours, Disp: 60 tablet, Rfl: 0 .  polyethylene glycol (MIRALAX / GLYCOLAX) packet, Take 17 g by mouth daily. Mix one tablespoon with 8oz of your favorite juice or water every day until you are having soft formed stools. Then start taking once daily if you didn't have a stool the day before., Disp: 30 each, Rfl: 0 .  Probiotic Product (ALIGN EXTRA STRENGTH PO), Take 70 capsules daily by mouth. , Disp: , Rfl:   ROS  Constitutional: Denies any fever or chills Gastrointestinal: No reported hemesis, hematochezia, vomiting, or acute GI distress Musculoskeletal: Denies any acute onset joint swelling, redness, loss of ROM, or weakness Neurological: No reported episodes of acute onset apraxia, aphasia, dysarthria, agnosia, amnesia, paralysis, loss of coordination, or loss of consciousness  Allergies  Ms. Legner is allergic to fentanyl; nsaids; and pentothal [thiopental].  PFSH  Drug: Ms. Duignan  reports that she does not use drugs. Alcohol:  reports that she does not drink alcohol. Tobacco:  reports that she has been smoking cigarettes.  She has a 25.00 pack-year smoking history. she has never used smokeless tobacco. Medical:  has a past medical history of Anxiety, Aortic aneurysm ([place]) (06/06/2016), Cataract, Chest pain (86/57/8469), Complication of anesthesia, Constipation due to opioid therapy, Decreased muscle strength (02/10/2014), Depression, Difficulty in walking(719.7) (02/10/2014), Dysphagia (08/19/2015), Dyspnea (08/19/2015), GERD (gastroesophageal reflux disease), History of kidney stones, Hyperparathyroidism, primary ([place]), Hyperventilation (08/18/2015), Insomnia, Intractable back pain (04/29/2014), Kidney stone, Lumbar spinal stenosis, Odynophagia (08/18/2015), Osteoarthritis of both knees, Pain, Rectal prolapse, Unintentional weight loss (09/08/2015), and UTI (lower  urinary tract infection) (04/28/2014). Surgical: Ms. Bjelland  has a past surgical history that includes Abdominal hysterectomy; Spine surgery; Parathyroidectomy (1990's); Appendectomy; Breast lumpectomy; Tonsillectomy; Back surgery (07-2015); Total hip arthroplasty (Left, 09/18/2017); Total knee arthroplasty (Bilateral, 1990's nd 2002); Joint replacement; Colonoscopy (2013); and Total hip arthroplasty (Left, 09/18/2017). Family: family history includes COPD in her father; Diabetes in her son; Heart failure in her father; Mental illness in her mother.  Constitutional Exam  General appearance: Well  nourished, well developed, and well hydrated. In no apparent acute distress Vitals:   10/08/17 1415  BP: 135/81  Pulse: (!) 108  Resp: 18  Temp: 98.2 F (36.8 C)  TempSrc: Oral  SpO2: 100%  Weight: 117 lb (53.1 kg)  Height: 5' (1.524 m)   BMI Assessment: Estimated body mass index is 22.85 kg/m as calculated from the following:   Height as of this encounter: 5' (1.524 m).   Weight as of this encounter: 117 lb (53.1 kg).  BMI interpretation table: BMI level Category Range association with higher incidence of chronic pain  <18 kg/m2 Underweight   18.5-24.9 kg/m2 Ideal body weight   25-29.9 kg/m2 Overweight Increased incidence by 20%  30-34.9 kg/m2 Obese (Class I) Increased incidence by 68%  35-39.9 kg/m2 Severe obesity (Class II) Increased incidence by 136%  >40 kg/m2 Extreme obesity (Class III) Increased incidence by 254%   BMI Readings from Last 4 Encounters:  10/08/17 22.85 kg/m  09/18/17 24.54 kg/m  09/11/17 24.54 kg/m  09/05/17 24.45 kg/m   Wt Readings from Last 4 Encounters:  10/08/17 117 lb (53.1 kg)  09/18/17 117 lb 6.4 oz (53.3 kg)  09/11/17 117 lb 6.4 oz (53.3 kg)  09/05/17 117 lb (53.1 kg)  Psych/Mental status: Alert, oriented x 3 (person, place, & time)       Eyes: PERLA Respiratory: No evidence of acute respiratory distress   Gait & Posture Assessment  Ambulation:  Patient ambulates using a walker Gait: Relatively normal for age and body habitus Posture: WNL   Lower Extremity Exam    Side: Right lower extremity  Side: Left lower extremity  Skin & Extremity Inspection: Skin color, temperature, and hair growth are WNL. No peripheral edema or cyanosis. No masses, redness, swelling, asymmetry, or associated skin lesions. No contractures.  Skin & Extremity Inspection: Dressing patent  Functional ROM: Unrestricted ROM          Functional ROM: Unrestricted ROM          Muscle Tone/Strength: Functionally intact. No obvious neuro-muscular anomalies detected.  Muscle Tone/Strength: Functionally intact. No obvious neuro-muscular anomalies detected.  Sensory (Neurological): Unimpaired  Sensory (Neurological): Unimpaired  Palpation: No palpable anomalies  Palpation: No palpable anomalies   Assessment  Primary Diagnosis & Pertinent Problem List: The primary encounter diagnosis was Chronic hip pain (Location of Secondary source of pain) (Left). Diagnoses of Chronic groin pain (Location of Tertiary source of pain) (Left) and Chronic pain syndrome were also pertinent to this visit.  Status Diagnosis  Controlled Controlled Controlled 1. Chronic hip pain (Location of Secondary source of pain) (Left)   2. Chronic groin pain (Location of Tertiary source of pain) (Left)   3. Chronic pain syndrome     Problems updated and reviewed during this visit: No problems updated. Plan of Care  Pharmacotherapy (Medications Ordered): No orders of the defined types were placed in this encounter. This SmartLink is deprecated. Use AVSMEDLIST instead to display the medication list for a patient. Medications administered today: Jowana L. Witters had no medications administered during this visit. Lab-work, procedure(s), and/or referral(s): No orders of the defined types were placed in this encounter.  Imaging and/or referral(s): None   Plan  Medication adjustment. OxyContin 10 mg  discarded. She was instructed to restart her Oxycodone 10 mg QID. She has 14 days of medication on hand. She has a prescription for November, December and January. Follow apt as scheduled   Provider-requested follow-up: No Follow-up on file.  Future Appointments  Date Time  Provider [place]  11/01/2017  3:15 PM Mcarthur Rossetti, MD [facility] None  11/12/2017 10:45 AM Vevelyn Francois, NP [facility] None   Primary Care Physician: [name], MD Location: [facility] Note by: Vevelyn Francois NP Date: 10/08/2017; Time: 3:30 PM  Pain Score Disclaimer: We use the NRS-11 scale. This is a self-reported, subjective measurement of pain severity with only modest accuracy. It is used primarily to identify changes within a particular patient. It must be understood that outpatient pain scales are significantly less accurate that those used for research, where they can be applied under ideal controlled circumstances with minimal exposure to variables. In reality, the score is likely to be a combination of pain intensity and pain affect, where pain affect describes the degree of emotional arousal or changes in action readiness caused by the sensory experience of pain. Factors such as social and work situation, setting, emotional state, anxiety levels, expectation, and prior pain experience may influence pain perception and show large inter-individual differences that may also be affected by time variables.  Patient instructions provided during this appointment: Patient Instructions   ____________________________________________________________________________________________  Medication Rules  Applies to: All patients receiving prescriptions (written or electronic).  Pharmacy of record: Pharmacy where electronic prescriptions will be sent. If written prescriptions are taken to a different pharmacy, please inform the nursing staff. The pharmacy listed in the  electronic medical record should be the one where you would like electronic prescriptions to be sent.  Prescription refills: Only during scheduled appointments. Applies to both, written and electronic prescriptions.  NOTE: The following applies primarily to controlled substances (Opioid* Pain Medications).   Patient's responsibilities: 1. Pain Pills: Bring all pain pills to every appointment (except for procedure appointments). 2. Pill Bottles: Bring pills in original pharmacy bottle. Always bring newest bottle. Bring bottle, even if empty. 3. Medication refills: You are responsible for knowing and keeping track of what medications you need refilled. The day before your appointment, write a list of all prescriptions that need to be refilled. Bring that list to your appointment and give it to the admitting nurse. Prescriptions will be written only during appointments. If you forget a medication, it will not be "Called in", "Faxed", or "electronically sent". You will need to get another appointment to get these prescribed. 4. Prescription Accuracy: You are responsible for carefully inspecting your prescriptions before leaving our office. Have the discharge nurse carefully go over each prescription with you, before taking them home. Make sure that your name is accurately spelled, that your address is correct. Check the name and dose of your medication to make sure it is accurate. Check the number of pills, and the written instructions to make sure they are clear and accurate. Make sure that you are given enough medication to last until your next medication refill appointment. 5. Taking Medication: Take medication as prescribed. Never take more pills than instructed. Never take medication more frequently than prescribed. Taking less pills or less frequently is permitted and encouraged, when it comes to controlled substances (written prescriptions).  6. Inform other Doctors: Always inform, all of your  healthcare providers, of all the medications you take. 7. Pain Medication from other Providers: You are not allowed to accept any additional pain medication from any other Doctor or Healthcare provider. There are two exceptions to this rule. (see below) In the event that you require additional pain medication, you are responsible for notifying us, as stated below. 8. Medication Agreement: You are  responsible for carefully reading and following our Medication Agreement. This must be signed before receiving any prescriptions from our practice. Safely store a copy of your signed Agreement. Violations to the Agreement will result in no further prescriptions. (Additional copies of our Medication Agreement are available upon request.) 9. Laws, Rules, & Regulations: All patients are expected to follow all Federal and [employer], [employer], Rules, [employer]. Ignorance of the Laws does not constitute a valid excuse. The use of any illegal substances is prohibited. 10. Adopted CDC guidelines & recommendations: Target dosing levels will be at or below 60 MME/day. Use of benzodiazepines** is not recommended.  Exceptions: There are only two exceptions to the rule of not receiving pain medications from other Healthcare Providers. 1. Exception #1 (Emergencies): In the event of an emergency (i.e.: accident requiring emergency care), you are allowed to receive additional pain medication. However, you are responsible for: As soon as you are able, call our office [phone], at any time of the day or night, and leave a message stating your name, the date and nature of the emergency, and the name and dose of the medication prescribed. In the event that your call is answered by a member of our staff, make sure to document and save the date, time, and the name of the person that took your information.  2. Exception #2 (Planned Surgery): In the event that you are scheduled by another doctor or dentist to have any type of  surgery or procedure, you are allowed (for a period no longer than 30 days), to receive additional pain medication, for the acute post-op pain. However, in this case, you are responsible for picking up a copy of our "Post-op Pain Management for Surgeons" handout, and giving it to your surgeon or dentist. This document is available at our office, and does not require an appointment to obtain it. Simply go to our office during business hours (Monday-Thursday from 8:00 AM to 4:00 PM) (Friday 8:00 AM to 12:00 Noon) or if you have a scheduled appointment with [place], prior to your surgery, and ask for it by name. In addition, you will need to provide [place] with your name, name of your surgeon, type of surgery, and date of procedure or surgery.  *Opioid medications include: morphine, codeine, oxycodone, oxymorphone, hydrocodone, hydromorphone, meperidine, tramadol, tapentadol, buprenorphine, fentanyl, methadone. **Benzodiazepine medications include: diazepam (Valium), alprazolam (Xanax), clonazepam (Klonopine), lorazepam (Ativan), clorazepate (Tranxene), chlordiazepoxide (Librium), estazolam (Prosom), oxazepam (Serax), temazepam (Restoril), triazolam (Halcion)  ____________________________________________________________________________________________

## 2017-10-09 NOTE — Telephone Encounter (Addendum)
Patient can be scheduled in January.  If needed it could be a 12:00 appointment on my half day.

## 2017-10-09 NOTE — Telephone Encounter (Signed)
How soon do we need to schedule her and where?

## 2017-10-09 NOTE — Telephone Encounter (Signed)
Patient is scheduled   

## 2017-10-09 NOTE — Addendum Note (Signed)
Addended by: Vevelyn Francois on: 10/09/2017 03:45 PM   Modules accepted: Level of Service

## 2017-10-10 ENCOUNTER — Ambulatory Visit (INDEPENDENT_AMBULATORY_CARE_PROVIDER_SITE_OTHER): Payer: Medicare PPO

## 2017-10-10 ENCOUNTER — Encounter (INDEPENDENT_AMBULATORY_CARE_PROVIDER_SITE_OTHER): Payer: Self-pay | Admitting: Physician Assistant

## 2017-10-10 ENCOUNTER — Ambulatory Visit (INDEPENDENT_AMBULATORY_CARE_PROVIDER_SITE_OTHER): Payer: Medicare PPO | Admitting: Physician Assistant

## 2017-10-10 DIAGNOSIS — M25552 Pain in left hip: Secondary | ICD-10-CM | POA: Diagnosis not present

## 2017-10-10 DIAGNOSIS — Z96642 Presence of left artificial hip joint: Secondary | ICD-10-CM

## 2017-10-10 NOTE — Progress Notes (Signed)
Mrs. Fischler returns today follow-up of her left hip.  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  She states that she got up in the middle night and fell recently concerned due to increased pain in her left hip.  She did not have a complete fall does fell into the commode to chair and hit her hip up against the commode chair.  No loss of consciousness no dizziness no chest pain.  Left hip surgical incisions healed well no signs of infection.  Slight seroma.  She ambulates about the room without any assistive device but uses a rolling walker to ambulate.  Left hip excellent range of motion without pain.  Left calf supple nontender.  Radiographs: AP pelvis and AP AP left hip and lateral left hip show well seated left total hip arthroplasty without any signs of complication.  There is no acute fractures no bony abnormalities. Impression: Status post left total hip arthroplasty  Plan: Left hips prepped with Betadine and ethyl chloride used to anesthetize skin and then 40 cc of serosanguineous fluid is obtained.  Patient tolerates well.  She will keep her regular appointment.

## 2017-10-11 ENCOUNTER — Telehealth: Payer: Self-pay | Admitting: Family Medicine

## 2017-10-11 NOTE — Telephone Encounter (Signed)
Suggest she reach out to the facility [facility]) where she was to clarify this.  Sounds like she was properly informed before discharge.

## 2017-10-11 NOTE — Telephone Encounter (Signed)
I spoke with Brittany Mays on Tuesday, she is upset because her pain medication was deceased at discharged by PA, Arlo..  Stated he did not tell about the medication dosage change until she was being discharged. Told patient I would contact Arlo and discuss and with her and get back with her.   I talked with Arlo and the RN, both  stated Brittany Mays was made aware of the change before discharge.  She was made aware of the dosage change by himself and the RN that worked with her.  Brittany Mays denies all of it.  She is upset because she should have been told prior to discharge. I Discussed with patient both provider and RN explained  this to her. Stated she would not leave it along" that she wants to speak with someone else.  Told her I would have someone to contact her.  She wants an apology.  Patient is presently going to a pain clinic. Forwarding message to lead physician.  [place]

## 2017-10-17 ENCOUNTER — Ambulatory Visit: Admit: 2017-10-17 | Payer: Medicare PPO | Admitting: Internal Medicine

## 2017-10-17 SURGERY — COLONOSCOPY WITH PROPOFOL
Anesthesia: General

## 2017-10-25 ENCOUNTER — Telehealth: Payer: Self-pay | Admitting: Internal Medicine

## 2017-11-01 ENCOUNTER — Ambulatory Visit (INDEPENDENT_AMBULATORY_CARE_PROVIDER_SITE_OTHER): Payer: Medicare PPO | Admitting: Orthopaedic Surgery

## 2017-11-01 ENCOUNTER — Encounter (INDEPENDENT_AMBULATORY_CARE_PROVIDER_SITE_OTHER): Payer: Self-pay | Admitting: Orthopaedic Surgery

## 2017-11-01 DIAGNOSIS — Z96642 Presence of left artificial hip joint: Secondary | ICD-10-CM

## 2017-11-01 NOTE — Progress Notes (Signed)
The patient is now between 6 and 7 weeks status post a left total hip arthroplasty.  She says she is doing wonderful and much better than when she was before surgery.  She does walk with a rolling walker but says most the time she does not needed.  On exam her incisions well-healed.  I can easily put her left hip the range of motion without any difficulty at all and she does not seem to have a lot of pain.  At this point I do not need to see her back for 6 months.  At that visit I would like a low AP pelvis and lateral of her left operative hip.  Obviously if she is having any issues before then she will let us know.

## 2017-11-02 NOTE — Telephone Encounter (Signed)
Of note-nursing staff at [facility] did discuss patient's concerns via phone with the patient- concerns been resolved-patient was irritated over what appears to be a misunderstanding about her medications at discharge- but per discussion with staff at [facility] matters have been resolved

## 2017-11-08 ENCOUNTER — Other Ambulatory Visit: Payer: Self-pay

## 2017-11-08 ENCOUNTER — Encounter: Payer: Self-pay | Admitting: Family Medicine

## 2017-11-08 ENCOUNTER — Telehealth: Payer: Self-pay

## 2017-11-08 ENCOUNTER — Ambulatory Visit (INDEPENDENT_AMBULATORY_CARE_PROVIDER_SITE_OTHER): Payer: Medicare PPO | Admitting: Family Medicine

## 2017-11-08 VITALS — BP 110/66 | HR 106 | Temp 97.8°F | Wt 115.6 lb

## 2017-11-08 DIAGNOSIS — R109 Unspecified abdominal pain: Secondary | ICD-10-CM | POA: Diagnosis not present

## 2017-11-08 DIAGNOSIS — G8929 Other chronic pain: Secondary | ICD-10-CM | POA: Diagnosis not present

## 2017-11-08 DIAGNOSIS — E781 Pure hyperglyceridemia: Secondary | ICD-10-CM

## 2017-11-08 DIAGNOSIS — Q790 Congenital diaphragmatic hernia: Secondary | ICD-10-CM

## 2017-11-08 DIAGNOSIS — F32A Depression, unspecified: Secondary | ICD-10-CM

## 2017-11-08 DIAGNOSIS — Z96642 Presence of left artificial hip joint: Secondary | ICD-10-CM

## 2017-11-08 DIAGNOSIS — F329 Major depressive disorder, single episode, unspecified: Secondary | ICD-10-CM

## 2017-11-08 DIAGNOSIS — F419 Anxiety disorder, unspecified: Secondary | ICD-10-CM | POA: Diagnosis not present

## 2017-11-08 DIAGNOSIS — I712 Thoracic aortic aneurysm, without rupture, unspecified: Secondary | ICD-10-CM

## 2017-11-08 DIAGNOSIS — R911 Solitary pulmonary nodule: Secondary | ICD-10-CM | POA: Diagnosis not present

## 2017-11-08 DIAGNOSIS — D649 Anemia, unspecified: Secondary | ICD-10-CM | POA: Diagnosis not present

## 2017-11-08 LAB — COMPREHENSIVE METABOLIC PANEL
ALT: 11 U/L (ref 0–35)
AST: 20 U/L (ref 0–37)
Albumin: 4.2 g/dL (ref 3.5–5.2)
Alkaline Phosphatase: 81 U/L (ref 39–117)
BILIRUBIN TOTAL: 0.4 mg/dL (ref 0.2–1.2)
BUN: 17 mg/dL (ref 6–23)
CHLORIDE: 101 meq/L (ref 96–112)
CO2: 31 meq/L (ref 19–32)
Calcium: 9.6 mg/dL (ref 8.4–10.5)
Creatinine, Ser: 0.78 mg/dL (ref 0.40–1.20)
GFR: 75.74 mL/min (ref >60.00)
Glucose, Bld: 97 mg/dL (ref 70–99)
Potassium: 5.1 mEq/L (ref 3.5–5.1)
Sodium: 140 mEq/L (ref 135–145)
Total Protein: 7.3 g/dL (ref 6.0–8.3)

## 2017-11-08 LAB — CBC WITH DIFFERENTIAL/PLATELET
BASOS ABS: 0.1 10*3/uL (ref 0.0–0.1)
Basophils Relative: 1.3 % (ref 0.0–3.0)
EOS ABS: 0.3 10*3/uL (ref 0.0–0.7)
Eosinophils Relative: 4.7 % (ref 0.0–5.0)
HCT: 42.9 % (ref 36.0–46.0)
Hemoglobin: 14.3 g/dL (ref 12.0–15.0)
LYMPHS ABS: 2.7 10*3/uL (ref 0.7–4.0)
Lymphocytes Relative: 40.6 % (ref 12.0–46.0)
MCHC: 33.3 g/dL (ref 30.0–36.0)
MCV: 95.2 fl (ref 78.0–100.0)
Monocytes Absolute: 0.5 10*3/uL (ref 0.1–1.0)
Monocytes Relative: 7.7 % (ref 3.0–12.0)
NEUTROS ABS: 3 10*3/uL (ref 1.4–7.7)
NEUTROS PCT: 45.7 % (ref 43.0–77.0)
PLATELETS: 187 10*3/uL (ref 150.0–400.0)
RBC: 4.5 Mil/uL (ref 3.87–5.11)
RDW: 14.4 % (ref 11.5–15.5)
WBC: 6.6 10*3/uL (ref 4.0–10.5)

## 2017-11-08 LAB — LIPID PANEL
CHOL/HDL RATIO: 3
Cholesterol: 213 mg/dL — ABNORMAL HIGH (ref 0–200)
HDL: 63.1 mg/dL (ref >39.00)
NONHDL: 150.25
Triglycerides: 209 mg/dL — ABNORMAL HIGH (ref 0.0–149.0)
VLDL: 41.8 mg/dL — ABNORMAL HIGH (ref 0.0–40.0)

## 2017-11-08 LAB — LDL CHOLESTEROL, DIRECT: LDL DIRECT: 134 mg/dL

## 2017-11-08 NOTE — Assessment & Plan Note (Signed)
No chest pain.  She will continue to follow with thoracic surgery.

## 2017-11-08 NOTE — Assessment & Plan Note (Signed)
Appears to have been stable on most recent scan.  She will continue to follow with thoracic surgery.

## 2017-11-08 NOTE — Telephone Encounter (Signed)
Referral placed.

## 2017-11-08 NOTE — Telephone Encounter (Signed)
Patient called back and would like the surgical referral to be with a surgeon who will be performing the surgery at [place] not [facility]

## 2017-11-08 NOTE — Assessment & Plan Note (Addendum)
Right sided.  Incidentally noted on CT scan abdomen and pelvis and noted to contain fat.  Less likely this is contributing to her recurrent abdominal issues though given persistent abdominal issues I would consider having her undergo evaluation with a general surgeon.  Also noted to have fat-containing inguinal hernias.  She notes no symptoms in those areas.  Discussed hernia return precautions.

## 2017-11-08 NOTE — Progress Notes (Signed)
Patient would like referral.

## 2017-11-08 NOTE — Assessment & Plan Note (Signed)
No significant anxiety.  Does have depression.  She is not interested in adding anything additional.  I think this is reasonable as this will likely improve once her colonoscopy returns.  I did offer referral to a therapist though she declined.  Given return precautions.

## 2017-11-08 NOTE — Progress Notes (Signed)
[name], MD Phone: [phone]  Brittany Mays is a 77 y.o. female who presents today for follow-up.  Depression/anxiety: Does note some depression intermittently related to her chronic medical issues.  No anxiety.  She is taking Cymbalta.  No SI or HI.  She has had chronic GI issues recently.  She has undergone quite a workup through her GI physician.  She notes constipation alternating with loose stools.  They have her on a probiotic and MiraLAX.  Also have her taking Bentyl for stomach cramps.  She wakes up in the morning with stomach pain.  No blood in her stool.  She is due to have a colonoscopy in the next several months.  She has had a CT abdomen and pelvis as well.  Inguinal hernias were noted containing fat on CT abdomen pelvis as well as a Bochdalek's hernia.  She notes no inguinal discomfort.  No bulges.  She underwent left hip replacement and went to rehab for about a week.  She has done quite well with this and was released for 37-monthfollow-up.  She has been following with thoracic surgery regarding aortic aneurysm.  She had lung nodules that appeared to be unchanged.  She is not ready to quit smoking.  Social History   Tobacco Use  Smoking Status Current Every Day Smoker  . Packs/day: 0.50  . Years: 50.00  . Pack years: 25.00  . Types: Cigarettes  Smokeless Tobacco Never Used     ROS see history of present illness  Objective  Physical Exam Vitals:   11/08/17 1159  BP: 110/66  Pulse: (!) 106  Temp: 97.8 F (36.6 C)  SpO2: 94%    BP Readings from Last 3 Encounters:  11/08/17 110/66  10/08/17 135/81  10/04/17 123/74   Wt Readings from Last 3 Encounters:  11/08/17 115 lb 9.6 oz (52.4 kg)  10/08/17 117 lb (53.1 kg)  09/18/17 117 lb 6.4 oz (53.3 kg)    Physical Exam  Constitutional: No distress.  Cardiovascular: Normal rate, regular rhythm and normal heart sounds.  Pulmonary/Chest: Effort normal and breath sounds normal.  Abdominal: Soft.  Bowel sounds are normal. She exhibits no distension. There is no tenderness. There is no rebound and no guarding.  Musculoskeletal: She exhibits no edema.  Neurological: She is alert. Gait normal.  Skin: Skin is warm and dry. She is not diaphoretic.     Assessment/Plan: Please see individual problem list.  Aortic aneurysm (HCC) No chest pain.  She will continue to follow with thoracic surgery.  Anxiety and depression No significant anxiety.  Does have depression.  She is not interested in adding anything additional.  I think this is reasonable as this will likely improve once her colonoscopy returns.  I did offer referral to a therapist though she declined.  Given return precautions.  Pulmonary nodule Appears to have been stable on most recent scan.  She will continue to follow with thoracic surgery.  Status post total replacement of left hip She has done quite well at this point.  She will continue to follow with orthopedic surgery.  Chronic abdominal pain Patient has had chronic abdominal pain and diarrhea.  Possibly some constipation contributing.  She has a benign exam today.  She has had an extensive workup.  She will complete her colonoscopy as planned.  Determine next step in management and evaluation following that.  Anemia Anemic following most recent hip surgery.  We will recheck today.  Bochdalek hernia Right sided.  Incidentally noted on  CT scan abdomen and pelvis and noted to contain fat.  Less likely this is contributing to her recurrent abdominal issues though given persistent abdominal issues I would consider having her undergo evaluation with a general surgeon.  Also noted to have fat-containing inguinal hernias.  She notes no symptoms in those areas.  Discussed hernia return precautions.   Shaasia was seen today for follow-up.  Diagnoses and all orders for this visit:  Hypertriglyceridemia -     Comp Met (CMET) -     Lipid panel  Anemia, unspecified type -      CBC w/Diff  Thoracic aortic aneurysm without rupture (HCC)  Anxiety and depression  Pulmonary nodule  Status post total replacement of left hip  Chronic abdominal pain  Bochdalek hernia    Orders Placed This Encounter  Procedures  . CBC w/Diff  . Comp Met (CMET)  . Lipid panel    No orders of the defined types were placed in this encounter.    [name], MD [place]

## 2017-11-08 NOTE — Progress Notes (Signed)
The 10-year ASCVD risk score Mikey Bussing DC Brooke Bonito., et al., 2013) is: 22.6%   Values used to calculate the score:     Age: 77 years     Sex: Female     Is Non-Hispanic African American: No     Diabetic: No     Tobacco smoker: Yes     Systolic Blood Pressure: 390 mmHg     Is BP treated: No     HDL Cholesterol: 63.1 mg/dL     Total Cholesterol: 213 mg/dL

## 2017-11-08 NOTE — Assessment & Plan Note (Signed)
Anemic following most recent hip surgery.  We will recheck today.

## 2017-11-08 NOTE — Assessment & Plan Note (Signed)
She has done quite well at this point.  She will continue to follow with orthopedic surgery.

## 2017-11-08 NOTE — Assessment & Plan Note (Signed)
Patient has had chronic abdominal pain and diarrhea.  Possibly some constipation contributing.  She has a benign exam today.  She has had an extensive workup.  She will complete her colonoscopy as planned.  Determine next step in management and evaluation following that.

## 2017-11-08 NOTE — Patient Instructions (Signed)
Nice to see you. If you want to see the therapist please let us know. Please keep your appointment for your colonoscopy. We will contact you regarding your hernia.

## 2017-11-09 ENCOUNTER — Ambulatory Visit
Admission: RE | Admit: 2017-11-09 | Payer: Medicare PPO | Source: Ambulatory Visit | Admitting: Unknown Physician Specialty

## 2017-11-09 ENCOUNTER — Encounter: Admission: RE | Payer: Self-pay | Source: Ambulatory Visit

## 2017-11-09 SURGERY — COLONOSCOPY WITH PROPOFOL
Anesthesia: General

## 2017-11-12 ENCOUNTER — Telehealth: Payer: Self-pay

## 2017-11-12 ENCOUNTER — Encounter: Payer: Self-pay | Admitting: Nurse Practitioner

## 2017-11-12 MED ORDER — ROSUVASTATIN CALCIUM 20 MG PO TABS: 20.0000 mg | ORAL_TABLET | Freq: Every day | ORAL | 3 refills | Status: DC

## 2017-11-12 NOTE — Telephone Encounter (Signed)
Spoke with Chrys Racer. Discussed patients intolerance to lipitor and she noted we could trial an alternate statin for this patient. Please let the patient know I sent in crestor for her to trial. If she has a reaction to this she needs to let us know. Thanks.

## 2017-11-12 NOTE — Telephone Encounter (Signed)
Spoke with patient regarding symptoms.  She notes discomfort in her groin though has no palpable hernia or bulge.  No nausea or vomiting.  She is having bowel movements daily.  She is passing gas.  No abdominal pain with this.  She states it feels slightly different than her hip pain prior to her surgery.  I discussed the option of having her evaluated at an urgent care tonight so that somebody could lay hands on her though she deferred this as she is going to speak with the general surgery office tomorrow to try to set up an appointment.  I discussed if she developed any worsening symptoms she needs to be evaluated tonight.

## 2017-11-12 NOTE — Telephone Encounter (Signed)
Patient states you found her hernia and she is now having pain in her groin x 2 weeks. Patient is wondering f the pain could be related to her hernia and what is herniated? . Patient had a hip replacement and thought it was related to that, I went away after her hip replacement but came back 2 weeks ago.

## 2017-11-13 ENCOUNTER — Telehealth: Payer: Self-pay

## 2017-11-13 NOTE — Telephone Encounter (Signed)
Pt states the first available appt for pt is 11/21/2017.  Pt states she was advised by [facility] if  Dr Josephina Gip would call them at [phone]  they could get pt in sooner.  Pt believes that is what the dr would want to do.   Pt states Dr Josephina Gip called her at home last night to inquire about this.

## 2017-11-13 NOTE — Telephone Encounter (Signed)
Please call patient regarding referral, referral is in

## 2017-11-13 NOTE — Telephone Encounter (Signed)
Please advise 

## 2017-11-13 NOTE — Telephone Encounter (Signed)
Patient notified

## 2017-11-13 NOTE — Telephone Encounter (Signed)
Copied from [place] [id number]. Topic: Referral - Status >> Nov 13, 2017  9:26 AM Arletha Grippe wrote: Reason for CRM: pt called about referral status for surgeon. Please call back at [phone] . Pt says this is time sensitive and would like call back asap

## 2017-11-14 NOTE — Telephone Encounter (Signed)
Brittany Mays spoke to Dr. Caryl Bis and he states that she does not need to be seen earlier than she is scheduled for.

## 2017-11-15 ENCOUNTER — Other Ambulatory Visit: Payer: Self-pay

## 2017-11-15 ENCOUNTER — Encounter: Payer: Self-pay | Admitting: Nurse Practitioner

## 2017-11-15 ENCOUNTER — Ambulatory Visit: Payer: Medicare PPO | Attending: Nurse Practitioner | Admitting: Nurse Practitioner

## 2017-11-15 VITALS — BP 124/83 | HR 75 | Temp 97.8°F | Resp 18 | Ht 60.0 in | Wt 115.0 lb

## 2017-11-15 DIAGNOSIS — E785 Hyperlipidemia, unspecified: Secondary | ICD-10-CM | POA: Diagnosis not present

## 2017-11-15 DIAGNOSIS — M960 Pseudarthrosis after fusion or arthrodesis: Secondary | ICD-10-CM | POA: Insufficient documentation

## 2017-11-15 DIAGNOSIS — Z87442 Personal history of urinary calculi: Secondary | ICD-10-CM | POA: Insufficient documentation

## 2017-11-15 DIAGNOSIS — M4316 Spondylolisthesis, lumbar region: Secondary | ICD-10-CM | POA: Insufficient documentation

## 2017-11-15 DIAGNOSIS — M25552 Pain in left hip: Secondary | ICD-10-CM | POA: Diagnosis not present

## 2017-11-15 DIAGNOSIS — R079 Chest pain, unspecified: Secondary | ICD-10-CM | POA: Insufficient documentation

## 2017-11-15 DIAGNOSIS — F329 Major depressive disorder, single episode, unspecified: Secondary | ICD-10-CM | POA: Diagnosis not present

## 2017-11-15 DIAGNOSIS — M545 Low back pain: Secondary | ICD-10-CM | POA: Diagnosis not present

## 2017-11-15 DIAGNOSIS — R531 Weakness: Secondary | ICD-10-CM | POA: Insufficient documentation

## 2017-11-15 DIAGNOSIS — M549 Dorsalgia, unspecified: Secondary | ICD-10-CM | POA: Diagnosis not present

## 2017-11-15 DIAGNOSIS — E669 Obesity, unspecified: Secondary | ICD-10-CM | POA: Insufficient documentation

## 2017-11-15 DIAGNOSIS — Z96653 Presence of artificial knee joint, bilateral: Secondary | ICD-10-CM | POA: Insufficient documentation

## 2017-11-15 DIAGNOSIS — G8929 Other chronic pain: Secondary | ICD-10-CM | POA: Diagnosis not present

## 2017-11-15 DIAGNOSIS — M47816 Spondylosis without myelopathy or radiculopathy, lumbar region: Secondary | ICD-10-CM | POA: Diagnosis not present

## 2017-11-15 DIAGNOSIS — Z981 Arthrodesis status: Secondary | ICD-10-CM | POA: Diagnosis not present

## 2017-11-15 DIAGNOSIS — M1610 Unilateral primary osteoarthritis, unspecified hip: Secondary | ICD-10-CM | POA: Insufficient documentation

## 2017-11-15 DIAGNOSIS — M961 Postlaminectomy syndrome, not elsewhere classified: Secondary | ICD-10-CM | POA: Diagnosis not present

## 2017-11-15 DIAGNOSIS — G894 Chronic pain syndrome: Secondary | ICD-10-CM | POA: Diagnosis not present

## 2017-11-15 DIAGNOSIS — F419 Anxiety disorder, unspecified: Secondary | ICD-10-CM | POA: Insufficient documentation

## 2017-11-15 DIAGNOSIS — N390 Urinary tract infection, site not specified: Secondary | ICD-10-CM | POA: Insufficient documentation

## 2017-11-15 DIAGNOSIS — I719 Aortic aneurysm of unspecified site, without rupture: Secondary | ICD-10-CM | POA: Insufficient documentation

## 2017-11-15 DIAGNOSIS — D649 Anemia, unspecified: Secondary | ICD-10-CM | POA: Diagnosis not present

## 2017-11-15 DIAGNOSIS — M7918 Myalgia, other site: Secondary | ICD-10-CM | POA: Insufficient documentation

## 2017-11-15 DIAGNOSIS — M25561 Pain in right knee: Secondary | ICD-10-CM | POA: Insufficient documentation

## 2017-11-15 DIAGNOSIS — R1310 Dysphagia, unspecified: Secondary | ICD-10-CM | POA: Insufficient documentation

## 2017-11-15 DIAGNOSIS — M170 Bilateral primary osteoarthritis of knee: Secondary | ICD-10-CM | POA: Insufficient documentation

## 2017-11-15 DIAGNOSIS — M48061 Spinal stenosis, lumbar region without neurogenic claudication: Secondary | ICD-10-CM | POA: Diagnosis not present

## 2017-11-15 DIAGNOSIS — Z79899 Other long term (current) drug therapy: Secondary | ICD-10-CM | POA: Insufficient documentation

## 2017-11-15 DIAGNOSIS — M25562 Pain in left knee: Secondary | ICD-10-CM | POA: Diagnosis not present

## 2017-11-15 DIAGNOSIS — K623 Rectal prolapse: Secondary | ICD-10-CM | POA: Insufficient documentation

## 2017-11-15 DIAGNOSIS — K5900 Constipation, unspecified: Secondary | ICD-10-CM | POA: Insufficient documentation

## 2017-11-15 DIAGNOSIS — R911 Solitary pulmonary nodule: Secondary | ICD-10-CM | POA: Insufficient documentation

## 2017-11-15 DIAGNOSIS — M7062 Trochanteric bursitis, left hip: Secondary | ICD-10-CM | POA: Insufficient documentation

## 2017-11-15 DIAGNOSIS — E210 Primary hyperparathyroidism: Secondary | ICD-10-CM | POA: Insufficient documentation

## 2017-11-15 DIAGNOSIS — Z96642 Presence of left artificial hip joint: Secondary | ICD-10-CM | POA: Insufficient documentation

## 2017-11-15 DIAGNOSIS — Z79891 Long term (current) use of opiate analgesic: Secondary | ICD-10-CM | POA: Insufficient documentation

## 2017-11-15 DIAGNOSIS — R064 Hyperventilation: Secondary | ICD-10-CM | POA: Insufficient documentation

## 2017-11-15 DIAGNOSIS — G8918 Other acute postprocedural pain: Secondary | ICD-10-CM | POA: Diagnosis not present

## 2017-11-15 DIAGNOSIS — R251 Tremor, unspecified: Secondary | ICD-10-CM | POA: Diagnosis not present

## 2017-11-15 DIAGNOSIS — M19079 Primary osteoarthritis, unspecified ankle and foot: Secondary | ICD-10-CM | POA: Insufficient documentation

## 2017-11-15 DIAGNOSIS — K219 Gastro-esophageal reflux disease without esophagitis: Secondary | ICD-10-CM | POA: Insufficient documentation

## 2017-11-15 MED ORDER — OXYCODONE HCL 10 MG PO TABS: 10.0000 mg | ORAL_TABLET | Freq: Four times a day (QID) | ORAL | 0 refills | Status: DC | PRN

## 2017-11-15 NOTE — Progress Notes (Signed)
Patient's Name: Brittany Mays  MRN: [id number]  Referring Provider: [name], MD  DOB: [birth year]  PCP: [name], MD  DOS: 11/15/2017  Note by: Vevelyn Francois NP  Service setting: Ambulatory outpatient  Specialty: Interventional Pain Management  Location: [facility] (AMB) Pain Management Facility    Patient type: Established    Primary Reason(s) for Visit: Encounter for prescription drug management. (Level of risk: moderate)  CC: Hip Pain (left) and Back Pain (lower)  HPI  Brittany Mays is a 77 y.o. year old, female patient, who comes today for a medication management evaluation. She has Anxiety and depression; Obesity (BMI 30-39.9); Lumbar spinal stenosis; History of lumbar fusion (T10 through L3 fusion); Generalized weakness; Mechanical complication of internal fixation device such as nail, plate or rod ([place]); Pseudarthrosis after fusion or arthrodesis; Pulmonary nodule; Chronic hip pain (Location of Secondary source of pain) (Left); Hyperlipidemia; Aortic aneurysm ([place]); Osteoarthritis, multiple sites; Chronic low back pain (Location of Primary Source of Pain) (Bilateral) (R>L); Failed back surgical syndrome (x4); Chronic knee pain (Bilateral) (L>R); Long term current use of opiate analgesic; Long term prescription opiate use; Opiate use (60 MME/Day); Encounter for pain management planning; Encounter for therapeutic drug level monitoring; Abnormal CT scan, lumbar spine (05/07/2015); Osteoarthritis of sacroiliac joint (Bilateral); Lumbar facet arthropathy ([place]); Lumbar spondylolisthesis; Lumbar foraminal stenosis (Bilateral); History of total bilateral knee replacement; Opioid-induced constipation ([facility]); Chronic groin pain (Location of Tertiary source of pain) (Left); Postlaminectomy syndrome of lumbar region; Lumbar facet syndrome (Bilateral) (R>L); Pseudarthrosis following spinal fusion; Lumbar spondylosis; Chronic upper back pain (midline); Chronic pain syndrome; Tremor; Musculoskeletal  pain; Acute postoperative pain; Osteoarthritis of hip (Left); Osteoarthritis of ankle or foot; Trochanteric bursitis of left hip; Osteoarthritis of hip  (Bilateral) (L>R); Status post total replacement of left hip; Anemia; Chronic abdominal pain; and Bochdalek hernia on their problem list. Her primarily concern today is the Hip Pain (left) and Back Pain (lower)  Pain Assessment: Location: Lower Back Radiating: n/a Onset: More than a month ago Duration: Chronic pain Quality: Sharp Severity: 0-No pain/10 (self-reported pain score)  Note: Reported level is compatible with observation.                          Timing: Intermittent Modifying factors: changing positions, medications  Brittany Mays was last scheduled for an appointment on 10/08/2017 for medication management. During today's appointment we reviewed Ms. Aylesworth's chronic pain status, as well as her outpatient medication regimen. She states that her hip is doing well. She has good ROM. She states that she does have occasional pain in her lower back on the right side. She states that this is the only area that is giving her occasional twinges. She admits that she is using her medication prn but she is not allowing her pain to get bad.   The patient  reports that she does not use drugs. Her body mass index is 22.46 kg/m.  Further details on both, my assessment(s), as well as the proposed treatment plan, please see below.  Controlled Substance Pharmacotherapy Assessment REMS (Risk Evaluation and Mitigation Strategy)  Analgesic:Oxycodone IR 10 mg every 6 hours (40 mg/dayof oxycodone) MME/day:54m/day  WLandis Martins RN  11/15/2017  1:19 PM  Sign at close encounter Nursing Pain Medication Assessment:  Safety precautions to be maintained throughout the outpatient stay will include: orient to surroundings, keep bed in low position, maintain call bell within reach at all times, provide assistance with transfer out  of bed and ambulation.   Medication Inspection Compliance: Pill count conducted under aseptic conditions, in front of the patient. Neither the pills nor the bottle was removed from the patient's sight at any time. Once count was completed pills were immediately returned to the patient in their original bottle.  Medication: Oxycodone IR Pill/Patch Count: 44 of 120 pills remain Pill/Patch Appearance: Markings consistent with prescribed medication Bottle Appearance: Standard pharmacy container. Clearly labeled. Filled Date: 12/15 / 2018 Last Medication intake:  Today   Pharmacokinetics: Liberation and absorption (onset of action): WNL Distribution (time to peak effect): WNL Metabolism and excretion (duration of action): WNL         Pharmacodynamics: Desired effects: Analgesia: Ms. Rosenburg reports >50% benefit. Functional ability: Patient reports that medication allows her to accomplish basic ADLs Clinically meaningful improvement in function (CMIF): Sustained CMIF goals met Perceived effectiveness: Described as relatively effective, allowing for increase in activities of daily living (ADL) Undesirable effects: Side-effects or Adverse reactions: None reported Monitoring: [place] PMP: Online review of the past 42-monthperiod conducted. Compliant with practice rules and regulations Last UDS on record: Summary  Date Value Ref Range Status  04/24/2017 FINAL  Final    Comment:    ==================================================================== TOXASSURE SELECT 13 (MW) ==================================================================== Test                             Result       Flag       Units Drug Present and Declared for Prescription Verification   Oxycodone                      1709         EXPECTED   ng/mg creat   Oxymorphone                    2393         EXPECTED   ng/mg creat   Noroxycodone                   8742         EXPECTED   ng/mg creat   Noroxymorphone                 1387         EXPECTED    ng/mg creat    Sources of oxycodone are scheduled prescription medications.    Oxymorphone, noroxycodone, and noroxymorphone are expected    metabolites of oxycodone. Oxymorphone is also available as a    scheduled prescription medication.   Phenobarbital                  PRESENT      EXPECTED    Phenobarbital is an expected metabolite of primidone;    Phenobarbital may also be administered as a prescription drug. Drug Absent but Declared for Prescription Verification   Hydrocodone                    Not Detected UNEXPECTED ng/mg creat ==================================================================== Test                      Result    Flag   Units      Ref Range   Creatinine              67               mg/dL      >=  20 ==================================================================== Declared Medications:  The flagging and interpretation on this report are based on the  following declared medications.  Unexpected results may arise from  inaccuracies in the declared medications.  **Note: The testing scope of this panel includes these medications:  Hydrocodone (Hydrocodone-Acetaminophen)  Oxycodone  Primidone  **Note: The testing scope of this panel does not include following  reported medications:  Acetaminophen (Hydrocodone-Acetaminophen)  Cyanocobalamin  Cyclobenzaprine  Duloxetine  Gabapentin  Lubiprostone  Melatonin  Multivitamin (MVI)  Naloxone  Omega-3 Fatty Acids  Polyethylene Glycol  Supplement  Supplement (Red Yeast Rice)  Vitamin C ==================================================================== For clinical consultation, please call [phone]. ====================================================================    UDS interpretation: Compliant          Medication Assessment Form: Reviewed. Patient indicates being compliant with therapy Treatment compliance: Compliant Risk Assessment Profile: Aberrant behavior: See prior evaluations. None  observed or detected today Comorbid factors increasing risk of overdose: See prior notes. No additional risks detected today Risk of substance use disorder (SUD): Low  ORT Scoring interpretation table:  Score <3 = Low Risk for SUD  Score between 4-7 = Moderate Risk for SUD  Score >8 = High Risk for Opioid Abuse   Risk Mitigation Strategies:  Patient Counseling: Covered Patient-Prescriber Agreement (PPA): Present and active  Notification to other healthcare providers: Done  Pharmacologic Plan: No change in therapy, at this time.             Laboratory Chemistry  Inflammation Markers (CRP: Acute Phase) (ESR: Chronic Phase) Lab Results  Component Value Date   CRP 0.6 07/12/2016   ESRSEDRATE 6 07/12/2016                 Rheumatology Markers No results found for: Elayne Guerin, [facility]              Renal Function Markers Lab Results  Component Value Date   BUN 17 11/08/2017   CREATININE 0.78 11/08/2017   GFRAA >60 10/01/2017   GFRNONAA >60 10/01/2017                 Hepatic Function Markers Lab Results  Component Value Date   AST 20 11/08/2017   ALT 11 11/08/2017   ALBUMIN 4.2 11/08/2017   ALKPHOS 81 11/08/2017   HCVAB NEGATIVE 09/07/2015   LIPASE 36 06/25/2017                 Electrolytes Lab Results  Component Value Date   NA 140 11/08/2017   K 5.1 11/08/2017   CL 101 11/08/2017   CALCIUM 9.6 11/08/2017   MG 2.0 07/12/2016                 Neuropathy Markers Lab Results  Component Value Date   VITAMINB12 498 10/01/2017   FOLATE 20.6 10/01/2017   HGBA1C 5.9 (H) 07/27/2015   HIV NONREACTIVE 09/07/2015                 Bone Pathology Markers Lab Results  Component Value Date   25OHVITD1 35 07/12/2016   25OHVITD2 <1.0 07/12/2016   25OHVITD3 35 07/12/2016                 Coagulation Parameters Lab Results  Component Value Date   PLT 187.0 11/08/2017                 Cardiovascular Markers Lab Results  Component Value  Date   TROPONINI <0.03 06/25/2017   HGB 14.3 11/08/2017  HCT 42.9 11/08/2017                 CA Markers No results found for: CEA, CA125, LABCA2               Note: Lab results reviewed.  Recent Diagnostic Imaging Results  XR HIP UNILAT W OR W/O PELVIS 2-3 VIEWS LEFT AP pelvis and AP AP left hip and lateral left hip show well seated left  total hip arthroplasty without any signs of complication.  There is no  acute fractures no bony abnormalities. Impression: Status post left total hip arthroplasty   Complexity Note: Imaging results reviewed. Results shared with Ms. Oesterling, using Layman's terms.                         Meds   Current Outpatient Medications:  .  dicyclomine (BENTYL) 20 MG tablet, Take 20 mg by mouth every 6 (six) hours as needed for spasms. , Disp: , Rfl:  .  docusate sodium (COLACE) 100 MG capsule, Take 200 mg by mouth daily., Disp: , Rfl:  .  DULoxetine (CYMBALTA) 60 MG capsule, TAKE 1 CAPSULE EVERY DAY, Disp: 90 capsule, Rfl: 3 .  Melatonin 5 MG CAPS, Take 5 mg by mouth at bedtime., Disp: , Rfl:  .  Multiple Vitamin (MULTIVITAMIN) tablet, Take 1 tablet by mouth daily., Disp: , Rfl:  .  Omega-3 1000 MG CAPS, Take by mouth daily., Disp: , Rfl:  .  polyethylene glycol (MIRALAX / GLYCOLAX) packet, Take 17 g by mouth daily. Mix one tablespoon with 8oz of your favorite juice or water every day until you are having soft formed stools. Then start taking once daily if you didn't have a stool the day before., Disp: 30 each, Rfl: 0 .  PRIMIDONE PO, Take by mouth., Disp: , Rfl:  .  Probiotic Product (ALIGN EXTRA STRENGTH PO), Take 70 capsules daily by mouth. , Disp: , Rfl:  .  Red Yeast Rice 600 MG TABS, Take by mouth., Disp: , Rfl:  .  [START ON 12/31/2017] Oxycodone HCl 10 MG TABS, Take 1 tablet (10 mg total) by mouth every 6 (six) hours as needed., Disp: 120 tablet, Rfl: 0  ROS  Constitutional: Denies any fever or chills Gastrointestinal: No reported hemesis,  hematochezia, vomiting, or acute GI distress Musculoskeletal: Denies any acute onset joint swelling, redness, loss of ROM, or weakness Neurological: No reported episodes of acute onset apraxia, aphasia, dysarthria, agnosia, amnesia, paralysis, loss of coordination, or loss of consciousness  Allergies  Ms. Schifano is allergic to fentanyl; nsaids; and pentothal [thiopental].  PFSH  Drug: Ms. Barra  reports that she does not use drugs. Alcohol:  reports that she does not drink alcohol. Tobacco:  reports that she has been smoking cigarettes.  She has a 25.00 pack-year smoking history. she has never used smokeless tobacco. Medical:  has a past medical history of Anxiety, Aortic aneurysm ([place]) (06/06/2016), Cataract, Chest pain (40/06/6760), Complication of anesthesia, Constipation due to opioid therapy, Decreased muscle strength (02/10/2014), Depression, Difficulty in walking(719.7) (02/10/2014), Dysphagia (08/19/2015), Dyspnea (08/19/2015), GERD (gastroesophageal reflux disease), History of kidney stones, Hyperparathyroidism, primary ([place]), Hyperventilation (08/18/2015), Insomnia, Intractable back pain (04/29/2014), Kidney stone, Lumbar spinal stenosis, Odynophagia (08/18/2015), Osteoarthritis of both knees, Pain, Rectal prolapse, Unintentional weight loss (09/08/2015), and UTI (lower urinary tract infection) (04/28/2014). Surgical: Ms. Radebaugh  has a past surgical history that includes Abdominal hysterectomy; Spine surgery; Parathyroidectomy (1990's); Appendectomy; Breast lumpectomy; Tonsillectomy; Back surgery (07-2015);  Total hip arthroplasty (Left, 09/18/2017); Total knee arthroplasty (Bilateral, 1990's nd 2002); Joint replacement; Colonoscopy (2013); and Total hip arthroplasty (Left, 09/18/2017). Family: family history includes COPD in her father; Diabetes in her son; Heart failure in her father; Mental illness in her mother.  Constitutional Exam  General appearance: Well nourished, well developed, and well  hydrated. In no apparent acute distress Vitals:   11/15/17 1313  BP: 124/83  Pulse: 75  Resp: 18  Temp: 97.8 F (36.6 C)  TempSrc: Oral  SpO2: 98%  Weight: 115 lb (52.2 kg)  Height: 5' (1.524 m)   BMI Assessment: Estimated body mass index is 22.46 kg/m as calculated from the following:   Height as of this encounter: 5' (1.524 m).   Weight as of this encounter: 115 lb (52.2 kg).  Psych/Mental status: Alert, oriented x 3 (person, place, & time)       Eyes: PERLA Respiratory: No evidence of acute respiratory distress  Cervical Spine Area Exam  Skin & Axial Inspection: No masses, redness, edema, swelling, or associated skin lesions Alignment: Symmetrical Functional ROM: Unrestricted ROM      Stability: No instability detected Muscle Tone/Strength: Functionally intact. No obvious neuro-muscular anomalies detected. Sensory (Neurological): Unimpaired Palpation: No palpable anomalies              Upper Extremity (UE) Exam    Side: Right upper extremity  Side: Left upper extremity  Skin & Extremity Inspection: Skin color, temperature, and hair growth are WNL. No peripheral edema or cyanosis. No masses, redness, swelling, asymmetry, or associated skin lesions. No contractures.  Skin & Extremity Inspection: Skin color, temperature, and hair growth are WNL. No peripheral edema or cyanosis. No masses, redness, swelling, asymmetry, or associated skin lesions. No contractures.  Functional ROM: Unrestricted ROM          Functional ROM: Unrestricted ROM          Muscle Tone/Strength: Functionally intact. No obvious neuro-muscular anomalies detected.  Muscle Tone/Strength: Functionally intact. No obvious neuro-muscular anomalies detected.  Sensory (Neurological): Unimpaired          Sensory (Neurological): Unimpaired          Palpation: No palpable anomalies              Palpation: No palpable anomalies              Specialized Test(s): Deferred         Specialized Test(s): Deferred           Thoracic Spine Area Exam  Skin & Axial Inspection: No masses, redness, or swelling Alignment: Symmetrical Functional ROM: Unrestricted ROM Stability: No instability detected Muscle Tone/Strength: Functionally intact. No obvious neuro-muscular anomalies detected. Sensory (Neurological): Unimpaired Muscle strength & Tone: No palpable anomalies  Lumbar Spine Area Exam  Skin & Axial Inspection: No masses, redness, or swelling Alignment: Symmetrical Functional ROM: Unrestricted ROM      Stability: No instability detected Muscle Tone/Strength: Functionally intact. No obvious neuro-muscular anomalies detected. Sensory (Neurological): Unimpaired Palpation: No palpable anomalies       Provocative Tests: Lumbar Hyperextension and rotation test: evaluation deferred today       Lumbar Lateral bending test: evaluation deferred today       Patrick's Maneuver: evaluation deferred today                    Gait & Posture Assessment  Ambulation: Unassisted Gait: Relatively normal for age and body habitus Posture: WNL   Lower Extremity Exam  Side: Right lower extremity  Side: Left lower extremity  Skin & Extremity Inspection: Skin color, temperature, and hair growth are WNL. No peripheral edema or cyanosis. No masses, redness, swelling, asymmetry, or associated skin lesions. No contractures.  Skin & Extremity Inspection: Skin color, temperature, and hair growth are WNL. No peripheral edema or cyanosis. No masses, redness, swelling, asymmetry, or associated skin lesions. No contractures.  Functional ROM: Unrestricted ROM          Functional ROM: Unrestricted ROM          Muscle Tone/Strength: Functionally intact. No obvious neuro-muscular anomalies detected.  Muscle Tone/Strength: Functionally intact. No obvious neuro-muscular anomalies detected.  Sensory (Neurological): Unimpaired  Sensory (Neurological): Unimpaired  Palpation: No palpable anomalies  Palpation: No palpable anomalies    Assessment  Primary Diagnosis & Pertinent Problem List: The primary encounter diagnosis was Chronic hip pain (Location of Secondary source of pain) (Left). Diagnoses of Chronic pain syndrome, Chronic low back pain (Location of Primary Source of Pain) (Bilateral) (R>L), and Chronic upper back pain (midline) were also pertinent to this visit.  Status Diagnosis  Controlled Controlled Controlled 1. Chronic hip pain (Location of Secondary source of pain) (Left)   2. Chronic pain syndrome   3. Chronic low back pain (Location of Primary Source of Pain) (Bilateral) (R>L)   4. Chronic upper back pain (midline)     Problems updated and reviewed during this visit: Problem  Chronic hip pain (Location of Secondary source of pain) (Left)   Plan of Care  Pharmacotherapy (Medications Ordered): Meds ordered this encounter  Medications  . Oxycodone HCl 10 MG TABS    Sig: Take 1 tablet (10 mg total) by mouth every 6 (six) hours as needed.    Dispense:  120 tablet    Refill:  0    Fill one day early if pharmacy is closed on scheduled refill date. Do not fill until: 12/31/2017 To last until: 01/30/2018    Order Specific Question:   Supervising Provider    Answer:   Milinda Pointer [phone]   New Prescriptions   No medications on file   Medications administered today: Lexxie L. Mctier had no medications administered during this visit. Lab-work, procedure(s), and/or referral(s): No orders of the defined types were placed in this encounter.  Imaging and/or referral(s): None  Interventional therapies: Planned, scheduled, and/or pending:   Not at this time.   Considering:   Diagnostic bilateral lumbar facet block #2under fluoro and sedation. Diagnostic bilateral thoracolumbar facet block. Possible bilateral thoracolumbar facet radiofrequencyablation.  Diagnostic left intra-articular hip joint injection. Diagnostic left femoral and obturator nerve articular branch blocks.  Possible left  femoral and obturator nerve articular branch radiofrequencyablation.  Diagnostic bilateral genicular nerve blocks.  Possible bilateral genicular nerve radiofrequencyablation.  Diagnosticintrathecal pump trial.    Palliative PRN treatment(s):   Palliative bilateral lumbar facet block   Provider-requested follow-up: Return in about 3 months (around 02/13/2018) for MedMgmt with Me Donella Stade Edison Pace).  Future Appointments  Date Time Provider [place]  01/08/2018  1:30 PM Vevelyn Francois, NP [facility] None  05/02/2018  1:00 PM Mcarthur Rossetti, MD [facility] None  05/08/2018  2:45 PM [name], MD [facility]   Primary Care Physician: [name], MD Location: [facility] Outpatient [facility] Management Facility Note by: Vevelyn Francois NP Date: 11/15/2017; Time: 1:53 PM  Pain Score Disclaimer: We use the NRS-11 scale. This is a self-reported, subjective measurement of pain severity with only modest accuracy. It is used  primarily to identify changes within a particular patient. It must be understood that outpatient pain scales are significantly less accurate that those used for research, where they can be applied under ideal controlled circumstances with minimal exposure to variables. In reality, the score is likely to be a combination of pain intensity and pain affect, where pain affect describes the degree of emotional arousal or changes in action readiness caused by the sensory experience of pain. Factors such as social and work situation, setting, emotional state, anxiety levels, expectation, and prior pain experience may influence pain perception and show large inter-individual differences that may also be affected by time variables.  Patient instructions provided during this appointment: Patient Instructions   ____________________________________________________________________________________________  Medication Rules  Applies to: All patients receiving prescriptions  (written or electronic).  Pharmacy of record: Pharmacy where electronic prescriptions will be sent. If written prescriptions are taken to a different pharmacy, please inform the nursing staff. The pharmacy listed in the electronic medical record should be the one where you would like electronic prescriptions to be sent.  Prescription refills: Only during scheduled appointments. Applies to both, written and electronic prescriptions.  NOTE: The following applies primarily to controlled substances (Opioid* Pain Medications).   Patient's responsibilities: 1. Pain Pills: Bring all pain pills to every appointment (except for procedure appointments). 2. Pill Bottles: Bring pills in original pharmacy bottle. Always bring newest bottle. Bring bottle, even if empty. 3. Medication refills: You are responsible for knowing and keeping track of what medications you need refilled. The day before your appointment, write a list of all prescriptions that need to be refilled. Bring that list to your appointment and give it to the admitting nurse. Prescriptions will be written only during appointments. If you forget a medication, it will not be "Called in", "Faxed", or "electronically sent". You will need to get another appointment to get these prescribed. 4. Prescription Accuracy: You are responsible for carefully inspecting your prescriptions before leaving our office. Have the discharge nurse carefully go over each prescription with you, before taking them home. Make sure that your name is accurately spelled, that your address is correct. Check the name and dose of your medication to make sure it is accurate. Check the number of pills, and the written instructions to make sure they are clear and accurate. Make sure that you are given enough medication to last until your next medication refill appointment. 5. Taking Medication: Take medication as prescribed. Never take more pills than instructed. Never take medication  more frequently than prescribed. Taking less pills or less frequently is permitted and encouraged, when it comes to controlled substances (written prescriptions).  6. Inform other Doctors: Always inform, all of your healthcare providers, of all the medications you take. 7. Pain Medication from other Providers: You are not allowed to accept any additional pain medication from any other Doctor or Healthcare provider. There are two exceptions to this rule. (see below) In the event that you require additional pain medication, you are responsible for notifying us, as stated below. 8. Medication Agreement: You are responsible for carefully reading and following our Medication Agreement. This must be signed before receiving any prescriptions from our practice. Safely store a copy of your signed Agreement. Violations to the Agreement will result in no further prescriptions. (Additional copies of our Medication Agreement are available upon request.) 9. Laws, Rules, & Regulations: All patients are expected to follow all Federal and [employer], [employer], Rules, [employer]. Ignorance of the Laws does not constitute  a valid excuse. The use of any illegal substances is prohibited. 10. Adopted CDC guidelines & recommendations: Target dosing levels will be at or below 60 MME/day. Use of benzodiazepines** is not recommended.  Exceptions: There are only two exceptions to the rule of not receiving pain medications from other Healthcare Providers. 1. Exception #1 (Emergencies): In the event of an emergency (i.e.: accident requiring emergency care), you are allowed to receive additional pain medication. However, you are responsible for: As soon as you are able, call our office [phone], at any time of the day or night, and leave a message stating your name, the date and nature of the emergency, and the name and dose of the medication prescribed. In the event that your call is answered by a member of our staff, make sure  to document and save the date, time, and the name of the person that took your information.  2. Exception #2 (Planned Surgery): In the event that you are scheduled by another doctor or dentist to have any type of surgery or procedure, you are allowed (for a period no longer than 30 days), to receive additional pain medication, for the acute post-op pain. However, in this case, you are responsible for picking up a copy of our "Post-op Pain Management for Surgeons" handout, and giving it to your surgeon or dentist. This document is available at our office, and does not require an appointment to obtain it. Simply go to our office during business hours (Monday-Thursday from 8:00 AM to 4:00 PM) (Friday 8:00 AM to 12:00 Noon) or if you have a scheduled appointment with [place], prior to your surgery, and ask for it by name. In addition, you will need to provide [place] with your name, name of your surgeon, type of surgery, and date of procedure or surgery.  *Opioid medications include: morphine, codeine, oxycodone, oxymorphone, hydrocodone, hydromorphone, meperidine, tramadol, tapentadol, buprenorphine, fentanyl, methadone. **Benzodiazepine medications include: diazepam (Valium), alprazolam (Xanax), clonazepam (Klonopine), lorazepam (Ativan), clorazepate (Tranxene), chlordiazepoxide (Librium), estazolam (Prosom), oxazepam (Serax), temazepam (Restoril), triazolam (Halcion)  You were given one prescription for Oxycodone today.  ____________________________________________________________________________________________

## 2017-11-15 NOTE — Patient Instructions (Addendum)
____________________________________________________________________________________________  Medication Rules  Applies to: All patients receiving prescriptions (written or electronic).  Pharmacy of record: Pharmacy where electronic prescriptions will be sent. If written prescriptions are taken to a different pharmacy, please inform the nursing staff. The pharmacy listed in the electronic medical record should be the one where you would like electronic prescriptions to be sent.  Prescription refills: Only during scheduled appointments. Applies to both, written and electronic prescriptions.  NOTE: The following applies primarily to controlled substances (Opioid* Pain Medications).   Patient's responsibilities: 1. Pain Pills: Bring all pain pills to every appointment (except for procedure appointments). 2. Pill Bottles: Bring pills in original pharmacy bottle. Always bring newest bottle. Bring bottle, even if empty. 3. Medication refills: You are responsible for knowing and keeping track of what medications you need refilled. The day before your appointment, write a list of all prescriptions that need to be refilled. Bring that list to your appointment and give it to the admitting nurse. Prescriptions will be written only during appointments. If you forget a medication, it will not be "Called in", "Faxed", or "electronically sent". You will need to get another appointment to get these prescribed. 4. Prescription Accuracy: You are responsible for carefully inspecting your prescriptions before leaving our office. Have the discharge nurse carefully go over each prescription with you, before taking them home. Make sure that your name is accurately spelled, that your address is correct. Check the name and dose of your medication to make sure it is accurate. Check the number of pills, and the written instructions to make sure they are clear and accurate. Make sure that you are given enough medication to  last until your next medication refill appointment. 5. Taking Medication: Take medication as prescribed. Never take more pills than instructed. Never take medication more frequently than prescribed. Taking less pills or less frequently is permitted and encouraged, when it comes to controlled substances (written prescriptions).  6. Inform other Doctors: Always inform, all of your healthcare providers, of all the medications you take. 7. Pain Medication from other Providers: You are not allowed to accept any additional pain medication from any other Doctor or Healthcare provider. There are two exceptions to this rule. (see below) In the event that you require additional pain medication, you are responsible for notifying us, as stated below. 8. Medication Agreement: You are responsible for carefully reading and following our Medication Agreement. This must be signed before receiving any prescriptions from our practice. Safely store a copy of your signed Agreement. Violations to the Agreement will result in no further prescriptions. (Additional copies of our Medication Agreement are available upon request.) 9. Laws, Rules, & Regulations: All patients are expected to follow all Federal and State Laws, Statutes, Rules, & Regulations. Ignorance of the Laws does not constitute a valid excuse. The use of any illegal substances is prohibited. 10. Adopted CDC guidelines & recommendations: Target dosing levels will be at or below 60 MME/day. Use of benzodiazepines** is not recommended.  Exceptions: There are only two exceptions to the rule of not receiving pain medications from other Healthcare Providers. 1. Exception #1 (Emergencies): In the event of an emergency (i.e.: accident requiring emergency care), you are allowed to receive additional pain medication. However, you are responsible for: As soon as you are able, call our office [phone], at any time of the day or night, and leave a message stating your  name, the date and nature of the emergency, and the name and dose of the medication   prescribed. In the event that your call is answered by a member of our staff, make sure to document and save the date, time, and the name of the person that took your information.  2. Exception #2 (Planned Surgery): In the event that you are scheduled by another doctor or dentist to have any type of surgery or procedure, you are allowed (for a period no longer than 30 days), to receive additional pain medication, for the acute post-op pain. However, in this case, you are responsible for picking up a copy of our "Post-op Pain Management for Surgeons" handout, and giving it to your surgeon or dentist. This document is available at our office, and does not require an appointment to obtain it. Simply go to our office during business hours (Monday-Thursday from 8:00 AM to 4:00 PM) (Friday 8:00 AM to 12:00 Noon) or if you have a scheduled appointment with us, prior to your surgery, and ask for it by name. In addition, you will need to provide us with your name, name of your surgeon, type of surgery, and date of procedure or surgery.  *Opioid medications include: morphine, codeine, oxycodone, oxymorphone, hydrocodone, hydromorphone, meperidine, tramadol, tapentadol, buprenorphine, fentanyl, methadone. **Benzodiazepine medications include: diazepam (Valium), alprazolam (Xanax), clonazepam (Klonopine), lorazepam (Ativan), clorazepate (Tranxene), chlordiazepoxide (Librium), estazolam (Prosom), oxazepam (Serax), temazepam (Restoril), triazolam (Halcion)  You were given one prescription for Oxycodone today. ____________________________________________________________________________________________   

## 2017-11-15 NOTE — Progress Notes (Signed)
Nursing Pain Medication Assessment:  Safety precautions to be maintained throughout the outpatient stay will include: orient to surroundings, keep bed in low position, maintain call bell within reach at all times, provide assistance with transfer out of bed and ambulation.  Medication Inspection Compliance: Pill count conducted under aseptic conditions, in front of the patient. Neither the pills nor the bottle was removed from the patient's sight at any time. Once count was completed pills were immediately returned to the patient in their original bottle.  Medication: Oxycodone IR Pill/Patch Count: 44 of 120 pills remain Pill/Patch Appearance: Markings consistent with prescribed medication Bottle Appearance: Standard pharmacy container. Clearly labeled. Filled Date: 12/15 / 2018 Last Medication intake:  Today

## 2017-11-16 ENCOUNTER — Other Ambulatory Visit: Payer: Self-pay | Admitting: Surgery

## 2017-11-16 DIAGNOSIS — K4090 Unilateral inguinal hernia, without obstruction or gangrene, not specified as recurrent: Secondary | ICD-10-CM | POA: Diagnosis not present

## 2017-11-21 ENCOUNTER — Other Ambulatory Visit: Payer: Self-pay | Admitting: Family Medicine

## 2017-11-21 MED ORDER — ROSUVASTATIN CALCIUM 20 MG PO TABS: 20.0000 mg | ORAL_TABLET | Freq: Every day | ORAL | 3 refills | Status: AC

## 2017-11-21 NOTE — Progress Notes (Signed)
c 

## 2017-11-26 NOTE — Pre-Procedure Instructions (Signed)
Brittany Mays  11/26/2017      [facility] - [place], [place] - [address] AT [place] [address] Phone: [phone] Fax: [phone]  [facility] - [place], [place] [address] Phone: [phone] Fax: [phone]    Your procedure is scheduled on 12/03/2017.  Report to [facility] Admitting at [name]  Call this number if you have problems the morning of surgery:  [phone]   Remember:  Do not eat food or drink liquids after midnight.   Continue all medications as directed by your physician except follow these medication instructions before surgery below   Take these medicines the morning of surgery with A SIP OF WATER: Cyclobenzaprine (Flexeril) - if needed for muscle spasms Dicyclomine (Bentyl) - if needed for spasms Duloxetine (Cymbalta) Oxycodone HCl - if needed  7 days prior to surgery STOP taking any Aspirin(unless otherwise instructed by your surgeon), Aleve, Naproxen, Ibuprofen, Motrin, Advil, Goody's, BC's, all herbal medications, fish oil, and all vitamins    Do not wear jewelry, make-up or nail polish.  Do not wear lotions, powders, or perfumes, or deodorant.  Do not shave 48 hours prior to surgery.  Men may shave face and neck.  Do not bring valuables to the hospital.  [facility] is not responsible for any belongings or valuables.  Hearing aids, eyeglasses, contacts, dentures or bridgework may not be worn into surgery.  Leave your suitcase in the car.  After surgery it may be brought to your room.  For patients admitted to the hospital, discharge time will be determined by your treatment team.  Patients discharged the day of surgery will not be allowed to drive home.   Name and phone number of your driver:    Special instructions:   [place]- Preparing For Surgery  Before surgery, you can  play an important role. Because skin is not sterile, your skin needs to be as free of germs as possible. You can reduce the number of germs on your skin by washing with CHG (chlorahexidine gluconate) Soap before surgery.  CHG is an antiseptic cleaner which kills germs and bonds with the skin to continue killing germs even after washing.  Please do not use if you have an allergy to CHG or antibacterial soaps. If your skin becomes reddened/irritated stop using the CHG.  Do not shave (including legs and underarms) for at least 48 hours prior to first CHG shower. It is OK to shave your face.  Please follow these instructions carefully.   1. Shower the NIGHT BEFORE SURGERY and the MORNING OF SURGERY with CHG.   2. If you chose to wash your hair, wash your hair first as usual with your normal shampoo.  3. After you shampoo, rinse your hair and body thoroughly to remove the shampoo.  4. Use CHG as you would any other liquid soap. You can apply CHG directly to the skin and wash gently with a scrungie or a clean washcloth.   5. Apply the CHG Soap to your body ONLY FROM THE NECK DOWN.  Do not use on open wounds or open sores. Avoid contact with your eyes, ears, mouth and genitals (private parts). Wash Face and genitals (private parts)  with your normal soap.  6. Wash thoroughly, paying special attention to the area where your surgery will be  performed.  7. Thoroughly rinse your body with warm water from the neck down.  8. DO NOT shower/wash with your normal soap after using and rinsing off the CHG Soap.  9. Pat yourself dry with a CLEAN TOWEL.  10. Wear CLEAN PAJAMAS to bed the night before surgery, wear comfortable clothes the morning of surgery  11. Place CLEAN SHEETS on your bed the night of your first shower and DO NOT SLEEP WITH PETS.    Day of Surgery: Shower as stated above. Do not apply any deodorants/lotions. Please wear clean clothes to the hospital/surgery center.      Please  read over the following fact sheets that you were given.

## 2017-11-27 ENCOUNTER — Encounter (HOSPITAL_COMMUNITY)
Admission: RE | Admit: 2017-11-27 | Discharge: 2017-11-27 | Disposition: A | Discharge status: Home / Self Care | Payer: Medicare HMO | Source: Ambulatory Visit | Attending: Surgery | Admitting: Surgery

## 2017-11-27 ENCOUNTER — Telehealth: Payer: Self-pay

## 2017-11-27 ENCOUNTER — Encounter (HOSPITAL_COMMUNITY): Payer: Self-pay

## 2017-11-27 ENCOUNTER — Other Ambulatory Visit: Payer: Self-pay

## 2017-11-27 ENCOUNTER — Telehealth: Payer: Self-pay | Admitting: Family Medicine

## 2017-11-27 DIAGNOSIS — I712 Thoracic aortic aneurysm, without rupture: Secondary | ICD-10-CM | POA: Diagnosis not present

## 2017-11-27 DIAGNOSIS — E079 Disorder of thyroid, unspecified: Secondary | ICD-10-CM | POA: Insufficient documentation

## 2017-11-27 DIAGNOSIS — F17200 Nicotine dependence, unspecified, uncomplicated: Secondary | ICD-10-CM | POA: Diagnosis not present

## 2017-11-27 DIAGNOSIS — Z79899 Other long term (current) drug therapy: Secondary | ICD-10-CM | POA: Insufficient documentation

## 2017-11-27 DIAGNOSIS — Z01818 Encounter for other preprocedural examination: Secondary | ICD-10-CM | POA: Insufficient documentation

## 2017-11-27 LAB — CBC
HCT: 40.9 % (ref 36.0–46.0)
HEMOGLOBIN: 13.5 g/dL (ref 12.0–15.0)
MCH: 31 pg (ref 26.0–34.0)
MCHC: 33 g/dL (ref 30.0–36.0)
MCV: 93.8 fL (ref 78.0–100.0)
Platelets: 203 10*3/uL (ref 150–400)
RBC: 4.36 MIL/uL (ref 3.87–5.11)
RDW: 14.4 % (ref 11.5–15.5)
WBC: 6.3 10*3/uL (ref 4.0–10.5)

## 2017-11-27 LAB — BASIC METABOLIC PANEL
Anion gap: 13 (ref 5–15)
BUN: 14 mg/dL (ref 6–20)
CHLORIDE: 102 mmol/L (ref 101–111)
CO2: 24 mmol/L (ref 22–32)
Calcium: 9.3 mg/dL (ref 8.9–10.3)
Creatinine, Ser: 0.71 mg/dL (ref 0.44–1.00)
GFR calc non Af Amer: >60 mL/min (ref >60)
Glucose, Bld: 109 mg/dL — ABNORMAL HIGH (ref 65–99)
POTASSIUM: 3.9 mmol/L (ref 3.5–5.1)
SODIUM: 139 mmol/L (ref 135–145)

## 2017-11-27 NOTE — Telephone Encounter (Signed)
Copied from [place] [id number]. Topic: Quick Communication - See Telephone Encounter >> Nov 27, 2017 12:44 PM Hewitt Shorts wrote: CRM for notification. See Telephone encounter for: pt is needing to talk with someone regarding coverage at the office that [employer] does not cover any more pt speaks about health coach and case management not sure what she is needing   Best number  11/27/17.

## 2017-11-27 NOTE — Telephone Encounter (Unsigned)
Copied from [place] [id number]. Topic: Quick Communication - See Telephone Encounter >> Nov 27, 2017 12:44 PM Hewitt Shorts wrote: CRM for notification. See Telephone encounter for: pt is needing to talk with someone regarding coverage at the office that [employer] does not cover any more pt speaks about health coach and case management not sure what she is needing   Best number  11/27/17.

## 2017-11-27 NOTE — Progress Notes (Addendum)
PCP is Dr. Jeannette How [place].  LOV 11/2017 Does say, that she has a small ascending aortic aneurysm, "nothing to worry about" (less than 4 CM)-- Sees Dr. Graciela Husbands - [place] She sees Dr. Wynona Canes at [facility] for her chronic back pain. Her big concern, is that with the hip replacement, she 'lost 4 days in the hospital, didn't know anything until 1-2 days in rehab" Denies murmur, denies cp, sob.   Never seen a cardio, nor has had any tests.

## 2017-11-27 NOTE — Pre-Procedure Instructions (Signed)
Brittany Mays  11/27/2017      [facility] - [place], [place] - [address] AT [place] [address] Phone: [phone] Fax: [phone]  [facility] Delivery - [place], [place] [address] Phone: [phone] Fax: [phone]    Your procedure is scheduled on Monday, Jan. 28th   Report to [place] at [place].M.             (posted surgery time 7:30 am - 8:30 am)   Call this number if you have problems the morning of surgery:  [phone]   Remember:   Do not eat food or drink liquids after midnight.   Continue all medications as directed by your physician except follow these medication instructions before surgery below   Take these medicines the morning of surgery with A SIP OF WATER: Cyclobenzaprine (Flexeril) - if needed for muscle spasms Dicyclomine (Bentyl) - if needed for spasms Duloxetine (Cymbalta) Oxycodone HCl - if needed  7 days prior to surgery STOP taking any Aspirin(unless otherwise instructed by your surgeon), Aleve, Naproxen, Ibuprofen, Motrin, Advil, Goody's, BC's, all herbal medications, fish oil, and all vitamins    Do not wear jewelry, make-up or nail polish.  Do not wear lotions, powders,  perfumes, or deodorant.  Do not shave 48 hours prior to surgery.  Do not bring valuables to the hospital.  [facility] is not responsible for any belongings or valuables.  Hearing aids, eyeglasses, contacts, dentures or bridgework may not be worn into surgery.  Leave your suitcase in the car.  After surgery it may be brought to your room.  For patients admitted to the hospital, discharge time will be determined by your treatment team.  Patients discharged the day of surgery will not be allowed to drive home, and will need someone to stay with you for the first 24 hrs.   [place]- Preparing For  Surgery  Before surgery, you can play an important role. Because skin is not sterile, your skin needs to be as free of germs as possible. You can reduce the number of germs on your skin by washing with CHG (chlorahexidine gluconate) Soap before surgery.  CHG is an antiseptic cleaner which kills germs and bonds with the skin to continue killing germs even after washing.  Please do not use if you have an allergy to CHG or antibacterial soaps. If your skin becomes reddened/irritated stop using the CHG.  Do not shave (including legs and underarms) for at least 48 hours prior to first CHG shower. It is OK to shave your face.  Please follow these instructions carefully.   1. Shower the NIGHT BEFORE SURGERY and the MORNING OF SURGERY with CHG.   2. If you chose to wash your hair, wash your hair first as usual with your normal shampoo.  3. After you shampoo, rinse your hair and body thoroughly to remove the shampoo.  4. Use CHG as you would any other liquid soap. You can apply CHG directly to the skin and wash gently with a scrungie or a clean washcloth.   5. Apply the CHG Soap to your body ONLY FROM THE NECK DOWN.  Do not use on open wounds or open sores. Avoid contact with your eyes, ears, mouth and genitals (private parts). Wash Face and genitals (private parts)  with your  normal soap.  6. Wash thoroughly, paying special attention to the area where your surgery will be performed.  7. Thoroughly rinse your body with warm water from the neck down.  8. DO NOT shower/wash with your normal soap after using and rinsing off the CHG Soap.  9. Pat yourself dry with a CLEAN TOWEL.  10. Wear CLEAN PAJAMAS to bed the night before surgery, wear comfortable clothes the morning of surgery  11. Place CLEAN SHEETS on your bed the night of your first shower and DO NOT SLEEP WITH PETS.    Day of Surgery: Shower as stated above. Do not apply any deodorants/lotions. Please wear clean clothes to the  hospital/surgery center.      Please read over the following fact sheets that you were given.

## 2017-11-28 NOTE — Telephone Encounter (Signed)
The patient informed me that she was going in for surgery and in times past her insurance company has orchestrated her receiving pre packaged meals.. When she called her insurance carrier to have them set up her meals for her up coming surgery she was informed that they don't do that anymore and she would need to contact her primary care office to have her meals set up through a case manager. I informed the patient that we do not have that type of service at our office, however after her surgery she can request the social worker at the hospital to see if someone can assist her to obtain pre package meals to be delivered to her home.

## 2017-11-28 NOTE — Progress Notes (Signed)
Anesthesia Chart Review:  Pt is a 77 year old female scheduled for L inguinal hernia repair with mesh on 12/03/2017 with Coralie Keens, MD  - PCP is [name], MD - Thoracic surgeon is Nestor Lewandowsky, MD. Last office visit 04/27/17; f/u 1 year recommended.   PMH includes: Ascending aortic aneurysm (3.8 cm by CT 04/20/17), primary hyperparathyroidism.  Current smoker.  BMI 23.  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   Medications include: Crestor  BP 128/81   Pulse 87   Temp 36.6 C   Resp 18   Ht 5' (1.524 m)   Wt 117 lb (53.1 kg)   SpO2 100%   BMI 22.85 kg/m   Preoperative labs reviewed.    1 view CXR 06/17/17: No acute abnormalities identified.  EKG 09/11/17: NSR with sinus arrhythmia. Minimal voltage criteria for LVH, may be normal variant  CT chest 04/20/17:  - Stable ectasia of the ascending thoracic aorta measuring up to 3.8 cm. - No evidence of acute cardiopulmonary disease. - Aortic Atherosclerosis   If no changes, I anticipate pt can proceed with surgery as scheduled.   Willeen Cass, FNP-BC [facility] Short Stay Surgical Center/Anesthesiology Phone: [phone] 11/28/2017 11:43 AM

## 2017-12-02 NOTE — H&P (Signed)
Ms. Brittany Mays Documented: 11/16/2017 10:05 AM Location: [facility] Patient #: [id number] DOB: 1941-02-24 Married / Language: Cleophus Molt / Race: White Female   History of Present Illness (Merikay Lesniewski A. Ninfa Linden MD; 11/16/2017 10:21 AM) The patient is a 77 year old female who presents with an inguinal hernia. This is a pleasant 77 year old female referred to me by Dr. [name] for evaluation of inguinal hernias as well as a small diaphragmatic hernia. She has chronic abdominal complaints. 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 She reports that she only has discomfort in the left inguinal area. This is described as a mild ache which occurs daily. It is different from the hip pain she had had which resolved after left hip replacement. She has not noticed a bulge in either groin. She has been asymptomatic from the diaphragm hernia.   Past Surgical History (Tanisha A. Owens Shark, [place]; 11/16/2017 10:05 AM) Appendectomy  Breast Biopsy  Left. Cataract Surgery  Left. Colon Polyp Removal - Colonoscopy  Foot Surgery  Left. Hip Surgery  Left. Hysterectomy (not due to cancer) - Complete  Knee Surgery  Left. Shoulder Surgery  Left. Spinal Surgery - Lower Back  Spinal Surgery Midback  Thyroid Surgery  Tonsillectomy   Diagnostic Studies History (Tanisha A. Owens Shark, [place]; 11/16/2017 10:05 AM) Colonoscopy  5-10 years ago Mammogram  within last year Pap Smear  >5 years ago  Allergies (Tanisha A. Owens Shark, [place]; 11/16/2017 10:07 AM) NSAIDs  Allergies Reconciled   Medication History (Tanisha A. Owens Shark, RMA; 11/16/2017 10:09 AM) OxyCODONE HCl (10MG  Tablet, Oral) Active. Dicyclomine HCl (10MG  Capsule, Oral) Active. Docusate Sodium (100MG  Tablet, Oral) Active. Melatonin (5MG  Capsule, Oral) Active. Multi-Day (Oral) Active. Omega  3 (550MG  Capsule, Oral) Active. Polyethylene Glycol 1000 Active. Primidone (250MG  Tablet, Oral) Active. Red Yeast Rice (600MG  Tablet, Oral) Active. Medications Reconciled  Social History (Tanisha A. Owens Shark, [place]; 11/16/2017 10:05 AM) Alcohol use  Occasional alcohol use. Caffeine use  Carbonated beverages, Coffee. No drug use  Tobacco use  Current every day smoker.  Family History (Tanisha A. Owens Shark, [place]; 11/16/2017 10:05 AM) Depression  Mother. Heart Disease  Mother. Hypertension  Father. Ischemic Bowel Disease  Mother.  Pregnancy / Birth History (Tanisha A. Owens Shark, [place]; 11/16/2017 10:05 AM) Age at menarche  36 years. Age of menopause  <43 Gravida  52 Maternal age  16-25 Para  67  Other Problems (Tanisha A. Owens Shark, [place]; 11/16/2017 10:05 AM) Arthritis  Back Pain  Depression  Hypercholesterolemia  Kidney Stone  Lump In Breast  Oophorectomy  Left. Thyroid Disease     Review of Systems (Tanisha A. Brown RMA; 11/16/2017 10:05 AM) General Present- Weight Loss. Not Present- Appetite Loss, Chills, Fatigue, Fever, Night Sweats and Weight Gain. Skin Present- Dryness. Not Present- Change in Wart/Mole, Hives, Jaundice, New Lesions, Non-Healing Wounds, Rash and Ulcer. HEENT Present- Wears glasses/contact lenses. Not Present- Earache, Hearing Loss, Hoarseness, Nose Bleed, Oral Ulcers, Ringing in the Ears, Seasonal Allergies, Sinus Pain, Sore Throat, Visual Disturbances and Yellow Eyes. Cardiovascular Not Present- Chest Pain, Difficulty Breathing Lying Down, Leg Cramps, Palpitations, Rapid Heart Rate, Shortness of Breath and Swelling of Extremities. Gastrointestinal Present- Abdominal Pain, Bloating, Change in Bowel Habits, Constipation, Excessive gas and Gets full quickly at meals. Not Present- Bloody Stool, Chronic diarrhea, Difficulty Swallowing, Hemorrhoids, Indigestion, Nausea, Rectal Pain and Vomiting. Female Genitourinary Not Present- Frequency, Nocturia, Painful  Urination, Pelvic Pain  and Urgency. Musculoskeletal Present- Back Pain and Joint Pain. Not Present- Joint Stiffness, Muscle Pain, Muscle Weakness and Swelling of Extremities. Neurological Present- Tremor. Not Present- Decreased Memory, Fainting, Headaches, Numbness, Seizures, Tingling, Trouble walking and Weakness. Psychiatric Present- Depression and Frequent crying. Not Present- Anxiety, Bipolar, Change in Sleep Pattern and Fearful. Endocrine Not Present- Cold Intolerance, Excessive Hunger, Hair Changes, Heat Intolerance, Hot flashes and New Diabetes. Hematology Not Present- Blood Thinners, Easy Bruising, Excessive bleeding, Gland problems, HIV and Persistent Infections.  Vitals (Tanisha A. Brown RMA; 11/16/2017 10:06 AM) 11/16/2017 10:06 AM Weight: 116.6 lb Height: 60in Body Surface Area: 1.48 m Body Mass Index: 22.77 kg/m  Temp.: 97.1F  Pulse: 72 (Regular)  BP: 112/68 (Sitting, Left Arm, Standard)       Physical Exam (Lamone Ferrelli A. Ninfa Linden MD; 11/16/2017 10:21 AM) General Mental Status-Alert. General Appearance-Consistent with stated age. Hydration-Well hydrated. Voice-Normal.  Head and Neck Head-normocephalic, atraumatic with no lesions or palpable masses. Trachea-midline.  Eye Eyeball - Bilateral-Extraocular movements intact. Sclera/Conjunctiva - Bilateral-No scleral icterus.  Chest and Lung Exam Chest and lung exam reveals -quiet, even and easy respiratory effort with no use of accessory muscles and on auscultation, normal breath sounds, no adventitious sounds and normal vocal resonance. Inspection Chest Wall - Normal. Back - normal.  Cardiovascular Cardiovascular examination reveals -normal heart sounds, regular rate and rhythm with no murmurs and normal pedal pulses bilaterally.  Abdomen Inspection Skin - Scar - no surgical scars. Hernias - Inguinal hernia - Left - Reducible. Note: The left inguinal hernia is very small. I cannot feel  a right inguinal hernia. I cannot feel any umbilical hernia. Palpation/Percussion Palpation and Percussion of the abdomen reveal - Soft, Non Tender, No Rebound tenderness, No Rigidity (guarding) and No hepatosplenomegaly. Auscultation Auscultation of the abdomen reveals - Bowel sounds normal.  Neurologic Neurologic evaluation reveals -alert and oriented x 3 with no impairment of recent or remote memory. Mental Status-Normal.  Musculoskeletal Normal Exam - Left-Upper Extremity Strength Normal and Lower Extremity Strength Normal. Normal Exam - Right-Upper Extremity Strength Normal, Lower Extremity Weakness.    Assessment & Plan (Benancio Osmundson A. Ninfa Linden MD; 11/16/2017 10:23 AM) LEFT INGUINAL HERNIA (K40.90) Impression: I discussed the diagnosis of the bilateral inguinal hernias with her as well as the small diaphragmatic hernia. She is asymptomatic from the diaphragmatic hernia so I would not recommend repair as is the extensive surgery which would even require a potential thoracotomy. She is only symptomatic from the left inguinal hernia. I discussed a simple open repair of the left inguinal hernia with limited anesthesia versed bilateral laparoscopic inguinal hernia repair with mesh under a deep general anesthetic. After hip replacement, she had amnesia of events for at least 4 days. Avoid out rather do just a simple open repair of the symptomatically left inguinal hernia with mesh with limited anesthesia. She is in complete agreement with this. I discussed the surgical procedure in detail. I discussed the risks which includes but is not limited to bleeding, infection, nerve entrapment, chronic pain, hernia recurrence, cardiopulmonary issues, etc. She understands and wished to proceed with surgery which will be scheduled

## 2017-12-03 ENCOUNTER — Ambulatory Visit (HOSPITAL_COMMUNITY): Payer: Medicare HMO | Admitting: Anesthesiology

## 2017-12-03 ENCOUNTER — Encounter (HOSPITAL_COMMUNITY)
Admission: RE | Disposition: A | Discharge status: Home / Self Care | Payer: Self-pay | Source: Ambulatory Visit | Attending: Surgery

## 2017-12-03 ENCOUNTER — Encounter (HOSPITAL_COMMUNITY): Payer: Self-pay | Admitting: *Deleted

## 2017-12-03 ENCOUNTER — Observation Stay (HOSPITAL_COMMUNITY)
Admission: RE | Admit: 2017-12-03 | Discharge: 2017-12-04 | Disposition: A | Discharge status: Home / Self Care | Payer: Medicare HMO | Source: Ambulatory Visit | Attending: Surgery | Admitting: Surgery

## 2017-12-03 ENCOUNTER — Ambulatory Visit (HOSPITAL_COMMUNITY): Payer: Medicare HMO | Admitting: Emergency Medicine

## 2017-12-03 DIAGNOSIS — K4090 Unilateral inguinal hernia, without obstruction or gangrene, not specified as recurrent: Secondary | ICD-10-CM | POA: Diagnosis not present

## 2017-12-03 DIAGNOSIS — F419 Anxiety disorder, unspecified: Secondary | ICD-10-CM | POA: Insufficient documentation

## 2017-12-03 DIAGNOSIS — K449 Diaphragmatic hernia without obstruction or gangrene: Secondary | ICD-10-CM | POA: Insufficient documentation

## 2017-12-03 DIAGNOSIS — Z79899 Other long term (current) drug therapy: Secondary | ICD-10-CM | POA: Insufficient documentation

## 2017-12-03 DIAGNOSIS — Z8719 Personal history of other diseases of the digestive system: Secondary | ICD-10-CM

## 2017-12-03 DIAGNOSIS — F418 Other specified anxiety disorders: Secondary | ICD-10-CM | POA: Diagnosis not present

## 2017-12-03 DIAGNOSIS — R911 Solitary pulmonary nodule: Secondary | ICD-10-CM | POA: Diagnosis not present

## 2017-12-03 DIAGNOSIS — I739 Peripheral vascular disease, unspecified: Secondary | ICD-10-CM | POA: Insufficient documentation

## 2017-12-03 DIAGNOSIS — K219 Gastro-esophageal reflux disease without esophagitis: Secondary | ICD-10-CM | POA: Diagnosis not present

## 2017-12-03 DIAGNOSIS — E785 Hyperlipidemia, unspecified: Secondary | ICD-10-CM | POA: Diagnosis not present

## 2017-12-03 DIAGNOSIS — F329 Major depressive disorder, single episode, unspecified: Secondary | ICD-10-CM | POA: Insufficient documentation

## 2017-12-03 DIAGNOSIS — F17200 Nicotine dependence, unspecified, uncomplicated: Secondary | ICD-10-CM | POA: Diagnosis not present

## 2017-12-03 DIAGNOSIS — Z9889 Other specified postprocedural states: Secondary | ICD-10-CM

## 2017-12-03 HISTORY — DX: Adverse effect of unspecified anesthetic, initial encounter: T41.45XA

## 2017-12-03 HISTORY — PX: INGUINAL HERNIA REPAIR: SHX194

## 2017-12-03 SURGERY — REPAIR, HERNIA, INGUINAL, ADULT
Anesthesia: General | Site: Inguinal | Laterality: Left

## 2017-12-03 MED ORDER — CEFAZOLIN SODIUM-DEXTROSE 2-4 GM/100ML-% IV SOLN
2.0000 g | INTRAVENOUS | Status: AC
  Administered 2017-12-03: 2 g via INTRAVENOUS

## 2017-12-03 MED ORDER — MORPHINE SULFATE (PF) 4 MG/ML IV SOLN
1.0000 mg | INTRAVENOUS | Status: DC | PRN
  Administered 2017-12-03 (×2): 3 mg via INTRAVENOUS
  Administered 2017-12-03: 2 mg via INTRAVENOUS
  Administered 2017-12-04 (×2): 3 mg via INTRAVENOUS
  Filled 2017-12-03 (×5): qty 1

## 2017-12-03 MED ORDER — FENTANYL CITRATE (PF) 100 MCG/2ML IJ SOLN
INTRAMUSCULAR | Status: DC | PRN
  Administered 2017-12-03 (×3): 25 ug via INTRAVENOUS

## 2017-12-03 MED ORDER — DEXAMETHASONE SODIUM PHOSPHATE 10 MG/ML IJ SOLN
INTRAMUSCULAR | Status: DC | PRN
  Administered 2017-12-03: 10 mg via INTRAVENOUS

## 2017-12-03 MED ORDER — OXYCODONE HCL 5 MG PO TABS
5.0000 mg | ORAL_TABLET | ORAL | Status: DC | PRN
  Administered 2017-12-03 (×2): 10 mg via ORAL
  Filled 2017-12-03 (×2): qty 2

## 2017-12-03 MED ORDER — DULOXETINE HCL 60 MG PO CPEP
60.0000 mg | ORAL_CAPSULE | Freq: Every day | ORAL | Status: DC
  Administered 2017-12-04: 60 mg via ORAL
  Filled 2017-12-03: qty 1

## 2017-12-03 MED ORDER — FENTANYL CITRATE (PF) 250 MCG/5ML IJ SOLN
INTRAMUSCULAR | Status: AC
  Filled 2017-12-03: qty 5

## 2017-12-03 MED ORDER — ONDANSETRON 4 MG PO TBDP: 4.0000 mg | ORAL_TABLET | Freq: Four times a day (QID) | ORAL | Status: DC | PRN

## 2017-12-03 MED ORDER — CEFAZOLIN SODIUM-DEXTROSE 2-4 GM/100ML-% IV SOLN
INTRAVENOUS | Status: AC
  Filled 2017-12-03: qty 100

## 2017-12-03 MED ORDER — DIPHENHYDRAMINE HCL 50 MG/ML IJ SOLN: 12.5000 mg | Freq: Four times a day (QID) | INTRAMUSCULAR | Status: DC | PRN

## 2017-12-03 MED ORDER — MEPERIDINE HCL 25 MG/ML IJ SOLN: 6.2500 mg | INTRAMUSCULAR | Status: DC | PRN

## 2017-12-03 MED ORDER — PROPOFOL 10 MG/ML IV BOLUS
INTRAVENOUS | Status: DC | PRN
  Administered 2017-12-03: 110 mg via INTRAVENOUS

## 2017-12-03 MED ORDER — CYCLOBENZAPRINE HCL 5 MG PO TABS
5.0000 mg | ORAL_TABLET | Freq: Three times a day (TID) | ORAL | Status: DC | PRN
  Administered 2017-12-03: 5 mg via ORAL
  Filled 2017-12-03: qty 1

## 2017-12-03 MED ORDER — PROMETHAZINE HCL 25 MG/ML IJ SOLN: 6.2500 mg | INTRAMUSCULAR | Status: DC | PRN

## 2017-12-03 MED ORDER — BUPIVACAINE-EPINEPHRINE 0.5% -1:200000 IJ SOLN
INTRAMUSCULAR | Status: DC | PRN
  Administered 2017-12-03: 20 mL

## 2017-12-03 MED ORDER — CHLORHEXIDINE GLUCONATE CLOTH 2 % EX PADS: 6.0000 | MEDICATED_PAD | Freq: Once | CUTANEOUS | Status: DC

## 2017-12-03 MED ORDER — OXYCODONE HCL 5 MG PO TABS: 5.0000 mg | ORAL_TABLET | Freq: Once | ORAL | Status: DC | PRN

## 2017-12-03 MED ORDER — HYDROMORPHONE HCL 1 MG/ML IJ SOLN
INTRAMUSCULAR | Status: AC
  Administered 2017-12-03: 0.5 mg via INTRAVENOUS
  Filled 2017-12-03: qty 1

## 2017-12-03 MED ORDER — DIPHENHYDRAMINE HCL 12.5 MG/5ML PO ELIX: 12.5000 mg | ORAL_SOLUTION | Freq: Four times a day (QID) | ORAL | Status: DC | PRN

## 2017-12-03 MED ORDER — SODIUM CHLORIDE 0.9 % IV SOLN
INTRAVENOUS | Status: DC
  Administered 2017-12-03: 14:00:00 via INTRAVENOUS

## 2017-12-03 MED ORDER — LIDOCAINE 2% (20 MG/ML) 5 ML SYRINGE
INTRAMUSCULAR | Status: AC
  Filled 2017-12-03: qty 5

## 2017-12-03 MED ORDER — DOCUSATE SODIUM 100 MG PO CAPS
200.0000 mg | ORAL_CAPSULE | Freq: Every day | ORAL | Status: DC
  Administered 2017-12-04: 200 mg via ORAL
  Filled 2017-12-03: qty 2

## 2017-12-03 MED ORDER — DEXAMETHASONE SODIUM PHOSPHATE 10 MG/ML IJ SOLN
INTRAMUSCULAR | Status: AC
  Filled 2017-12-03: qty 1

## 2017-12-03 MED ORDER — BUPIVACAINE-EPINEPHRINE (PF) 0.5% -1:200000 IJ SOLN
INTRAMUSCULAR | Status: AC
  Filled 2017-12-03: qty 30

## 2017-12-03 MED ORDER — LIDOCAINE 2% (20 MG/ML) 5 ML SYRINGE
INTRAMUSCULAR | Status: DC | PRN
  Administered 2017-12-03: 60 mg via INTRAVENOUS

## 2017-12-03 MED ORDER — ONDANSETRON HCL 4 MG/2ML IJ SOLN: 4.0000 mg | Freq: Four times a day (QID) | INTRAMUSCULAR | Status: DC | PRN

## 2017-12-03 MED ORDER — OXYCODONE HCL 5 MG/5ML PO SOLN: 5.0000 mg | Freq: Once | ORAL | Status: DC | PRN

## 2017-12-03 MED ORDER — ENOXAPARIN SODIUM 40 MG/0.4ML ~~LOC~~ SOLN
40.0000 mg | SUBCUTANEOUS | Status: DC
  Filled 2017-12-03: qty 0.4

## 2017-12-03 MED ORDER — HYDROMORPHONE HCL 1 MG/ML IJ SOLN
0.2500 mg | INTRAMUSCULAR | Status: DC | PRN
  Administered 2017-12-03: 0.5 mg via INTRAVENOUS
  Administered 2017-12-03 (×2): 0.25 mg via INTRAVENOUS

## 2017-12-03 MED ORDER — ONDANSETRON HCL 4 MG/2ML IJ SOLN
INTRAMUSCULAR | Status: AC
  Filled 2017-12-03: qty 2

## 2017-12-03 MED ORDER — LACTATED RINGERS IV SOLN
INTRAVENOUS | Status: DC | PRN
  Administered 2017-12-03: 07:00:00 via INTRAVENOUS

## 2017-12-03 MED ORDER — 0.9 % SODIUM CHLORIDE (POUR BTL) OPTIME
TOPICAL | Status: DC | PRN
  Administered 2017-12-03: 1000 mL

## 2017-12-03 MED ORDER — ONDANSETRON HCL 4 MG/2ML IJ SOLN
INTRAMUSCULAR | Status: DC | PRN
  Administered 2017-12-03: 4 mg via INTRAVENOUS

## 2017-12-03 MED ORDER — PROPOFOL 10 MG/ML IV BOLUS
INTRAVENOUS | Status: AC
  Filled 2017-12-03: qty 20

## 2017-12-03 SURGICAL SUPPLY — 39 items
BLADE SURG 10 STRL SS (BLADE) ×2 IMPLANT
BLADE SURG 15 STRL LF DISP TIS (BLADE) ×1 IMPLANT
BLADE SURG 15 STRL SS (BLADE) ×1
CHLORAPREP W/TINT 26ML (MISCELLANEOUS) ×2 IMPLANT
COVER SURGICAL LIGHT HANDLE (MISCELLANEOUS) ×2 IMPLANT
DERMABOND ADVANCED (GAUZE/BANDAGES/DRESSINGS) ×1
DERMABOND ADVANCED .7 DNX12 (GAUZE/BANDAGES/DRESSINGS) ×1 IMPLANT
DRAPE LAPAROTOMY TRNSV 102X78 (DRAPE) ×2 IMPLANT
DRAPE UTILITY XL STRL (DRAPES) ×2 IMPLANT
ELECT CAUTERY BLADE 6.4 (BLADE) ×2 IMPLANT
ELECT REM PT RETURN 9FT ADLT (ELECTROSURGICAL) ×2
ELECTRODE REM PT RTRN 9FT ADLT (ELECTROSURGICAL) ×1 IMPLANT
GLOVE BIOGEL PI IND STRL 6 (GLOVE) ×1 IMPLANT
GLOVE BIOGEL PI INDICATOR 6 (GLOVE) ×1
GLOVE SURG SIGNA 7.5 PF LTX (GLOVE) ×2 IMPLANT
GLOVE SURG SS PI 6.0 STRL IVOR (GLOVE) ×2 IMPLANT
GLOVE SURG SS PI 6.5 STRL IVOR (GLOVE) ×2 IMPLANT
GOWN STRL REUS W/ TWL LRG LVL3 (GOWN DISPOSABLE) ×1 IMPLANT
GOWN STRL REUS W/ TWL XL LVL3 (GOWN DISPOSABLE) ×1 IMPLANT
GOWN STRL REUS W/TWL LRG LVL3 (GOWN DISPOSABLE) ×1
GOWN STRL REUS W/TWL XL LVL3 (GOWN DISPOSABLE) ×1
KIT BASIN OR (CUSTOM PROCEDURE TRAY) ×2 IMPLANT
KIT TURNOVER KIT B (KITS) ×2 IMPLANT
MESH PARIETEX PROGRIP LEFT (Mesh General) ×2 IMPLANT
NEEDLE HYPO 25GX1X1/2 BEV (NEEDLE) ×2 IMPLANT
NS IRRIG 1000ML POUR BTL (IV SOLUTION) ×2 IMPLANT
PACK SURGICAL SETUP 50X90 (CUSTOM PROCEDURE TRAY) ×2 IMPLANT
PAD ARMBOARD 7.5X6 YLW CONV (MISCELLANEOUS) ×2 IMPLANT
PENCIL BUTTON HOLSTER BLD 10FT (ELECTRODE) ×2 IMPLANT
SPONGE LAP 18X18 X RAY DECT (DISPOSABLE) ×2 IMPLANT
SUT MON AB 4-0 PC3 18 (SUTURE) ×2 IMPLANT
SUT NOVA NAB GS-21 0 18 T12 DT (SUTURE) IMPLANT
SUT SILK 2 0 SH (SUTURE) ×2 IMPLANT
SUT VIC AB 2-0 CT1 27 (SUTURE) ×2
SUT VIC AB 2-0 CT1 TAPERPNT 27 (SUTURE) ×2 IMPLANT
SUT VIC AB 3-0 CT1 27 (SUTURE) ×1
SUT VIC AB 3-0 CT1 TAPERPNT 27 (SUTURE) ×1 IMPLANT
SYR CONTROL 10ML LL (SYRINGE) ×2 IMPLANT
TOWEL GREEN STERILE (TOWEL DISPOSABLE) ×2 IMPLANT

## 2017-12-03 NOTE — Transfer of Care (Signed)
Immediate Anesthesia Transfer of Care Note  Patient: Brittany Mays  Procedure(s) Performed: LEFT INGUINAL HERNIA REPAIR WITH MESH (Left Inguinal)  Patient Location: PACU  Anesthesia Type:General  Level of Consciousness: awake, alert  and oriented  Airway & Oxygen Therapy: Patient Spontanous Breathing  Post-op Assessment: Report given to RN, Post -op Vital signs reviewed and stable and Patient moving all extremities X 4  Post vital signs: Reviewed and stable  Last Vitals:  Vitals:   12/03/17 0543 12/03/17 0809  BP: 134/76   Pulse: 63   Resp: 17   Temp: 36.7 C 36.7 C  SpO2: 100%     Last Pain:  Vitals:   12/03/17 0557  TempSrc:   PainSc: 2       Patients Stated Pain Goal: 7 (98/26/41 5830)  Complications: No apparent anesthesia complications

## 2017-12-03 NOTE — Interval H&P Note (Signed)
History and Physical Interval Note: no change in H and P  12/03/2017 7:08 AM  Brittany Mays  has presented today for surgery, with the diagnosis of left inguinal hernia  The various methods of treatment have been discussed with the patient and family. After consideration of risks, benefits and other options for treatment, the patient has consented to  Procedure(s) with comments: LEFT INGUINAL HERNIA REPAIR WITH MESH (Left) - LMA as a surgical intervention .  The patient's history has been reviewed, patient examined, no change in status, stable for surgery.  I have reviewed the patient's chart and labs.  Questions were answered to the patient's satisfaction.     Zubin Pontillo A

## 2017-12-03 NOTE — Progress Notes (Signed)
Patient arrived to 6n31, alert and oriented, mild tenderness to mid abdomen. IV fluids running, SCDS on, VSS. Patient noted to have a mid left lower incision with skin glue that looks clean dry and intact. Oriented to room and staff, will continue to monitor.

## 2017-12-03 NOTE — Anesthesia Postprocedure Evaluation (Signed)
Anesthesia Post Note  Patient: [name]  Procedure(s) Performed: LEFT INGUINAL HERNIA REPAIR WITH MESH (Left Inguinal)     Patient location during evaluation: PACU Anesthesia Type: General Level of consciousness: awake and alert Pain management: pain level controlled Vital Signs Assessment: post-procedure vital signs reviewed and stable Respiratory status: spontaneous breathing, nonlabored ventilation and respiratory function stable Cardiovascular status: blood pressure returned to baseline and stable Postop Assessment: no apparent nausea or vomiting Anesthetic complications: no    Last Vitals:  Vitals:   12/03/17 0915 12/03/17 0919  BP:  (!) 148/67  Pulse: 69 72  Resp: 15 15  Temp:  36.5 C  SpO2: 93% 94%    Last Pain:  Vitals:   12/03/17 0919  TempSrc:   PainSc: 2                  [name]

## 2017-12-03 NOTE — Anesthesia Procedure Notes (Signed)
Procedure Name: LMA Insertion Date/Time: 12/03/2017 7:36 AM Performed by: Harden Mo, CRNA Pre-anesthesia Checklist: Patient identified, Emergency Drugs available, Suction available and Patient being monitored Patient Re-evaluated:Patient Re-evaluated prior to induction Oxygen Delivery Method: Circle System Utilized Preoxygenation: Pre-oxygenation with 100% oxygen Induction Type: IV induction LMA: LMA inserted LMA Size: 4.0 Number of attempts: 1 Airway Equipment and Method: Bite block Placement Confirmation: positive ETCO2 Tube secured with: Tape Dental Injury: Teeth and Oropharynx as per pre-operative assessment

## 2017-12-03 NOTE — Op Note (Signed)
LEFT INGUINAL HERNIA REPAIR WITH MESH  Procedure Note  Brittany Mays 12/03/2017   Pre-op Diagnosis: left inguinal hernia     Post-op Diagnosis: same  Procedure(s): LEFT INGUINAL HERNIA REPAIR WITH MESH  Surgeon(s): Coralie Keens, MD  Anesthesia: General  Staff:  Circulator: Rozell Searing, RN Scrub Person: Mariella Saa M Circulator Assistant: Deland Pretty, RN  Estimated Blood Loss: Minimal               Findings: The patient was found to have a moderate sized indirect left inguinal hernia which was repaired with a piece of large Prolene pro-grip mesh.  Procedure: The patient was brought to the operating room and identified as the correct patient.  She was placed supine on the operating room table and general anesthesia was induced.  Her left lower quadrant and groin were prepped and draped in the usual sterile fashion.  I anesthetized the skin in the left inguinal area with Marcaine.  I made a longitudinal incision with a scalpel.  I took this down through Scarpa's fascia with electrocautery.  The external oblique fascia was then identified and opened toward the internal and external rings.  The patient had an indirect hernia sac.  I dissected the sac down to the base.  The sac contained only fat.  I involuted the sac and then closed the internal ring with interrupted silk sutures.  Next a piece of pro-grip Prolene mesh was brought to the field.  I placed as an onlay on the inguinal floor.  I then sutured the mesh to the pubic tubercle, shelving edge of the inguinal ligament, and the transversalis fascia with interrupted 2-0 Vicryl sutures.  Wide coverage of the inguinal floor appeared to be achieved.  I then closed the external oblique fascia over the top of the mesh with a running 2-0 Vicryl suture.  I had identified the ilioinguinal nerve which I sacrificed.  I then anesthetized the fascia and performed an ilioinguinal nerve block with Marcaine.  I closed Scarpa's fascia with  interrupted 3-0 Vicryl sutures and closed the skin with a running 4-0 Monocryl.  Dermabond was then applied.  The patient tolerated the procedure well.  All the counts were correct at the end of the procedure.  The patient was then extubated in the operating room and taken in a stable condition to the recovery room.          [name] A   Date: 12/03/2017  Time: 8:07 AM

## 2017-12-03 NOTE — Anesthesia Preprocedure Evaluation (Signed)
Anesthesia Evaluation  Patient identified by MRN, date of birth, ID band Patient awake    Reviewed: Allergy & Precautions, NPO status , Patient's Chart, lab work & pertinent test results  Airway Mallampati: II  TM Distance: >3 FB     Dental  (+) Edentulous Upper, Edentulous Lower   Pulmonary shortness of breath, Current Smoker,    breath sounds clear to auscultation       Cardiovascular + Peripheral Vascular Disease   Rhythm:Regular Rate:Normal     Neuro/Psych Anxiety Depression    GI/Hepatic GERD  ,  Endo/Other  negative endocrine ROS  Renal/GU Renal InsufficiencyRenal disease     Musculoskeletal  (+) Arthritis , Back pain , back surgery , and fusion   Abdominal   Peds  Hematology negative hematology ROS (+)   Anesthesia Other Findings   Reproductive/Obstetrics                             Anesthesia Physical  Anesthesia Plan  ASA: III  Anesthesia Plan: General   Post-op Pain Management:    Induction: Intravenous  PONV Risk Score and Plan: 3 and Ondansetron, Dexamethasone, Treatment may vary due to age or medical condition and Midazolam  Airway Management Planned: LMA  Additional Equipment:   Intra-op Plan:   Post-operative Plan: Extubation in OR  Informed Consent: I have reviewed the patients History and Physical, chart, labs and discussed the procedure including the risks, benefits and alternatives for the proposed anesthesia with the patient or authorized representative who has indicated his/her understanding and acceptance.   Dental advisory given  Plan Discussed with: CRNA  Anesthesia Plan Comments:         Anesthesia Quick Evaluation

## 2017-12-04 ENCOUNTER — Encounter (HOSPITAL_COMMUNITY): Payer: Self-pay | Admitting: Surgery

## 2017-12-04 ENCOUNTER — Other Ambulatory Visit: Payer: Self-pay

## 2017-12-04 DIAGNOSIS — Z79899 Other long term (current) drug therapy: Secondary | ICD-10-CM | POA: Diagnosis not present

## 2017-12-04 DIAGNOSIS — K219 Gastro-esophageal reflux disease without esophagitis: Secondary | ICD-10-CM | POA: Diagnosis not present

## 2017-12-04 DIAGNOSIS — I739 Peripheral vascular disease, unspecified: Secondary | ICD-10-CM | POA: Diagnosis not present

## 2017-12-04 DIAGNOSIS — F329 Major depressive disorder, single episode, unspecified: Secondary | ICD-10-CM | POA: Diagnosis not present

## 2017-12-04 DIAGNOSIS — F419 Anxiety disorder, unspecified: Secondary | ICD-10-CM | POA: Diagnosis not present

## 2017-12-04 DIAGNOSIS — K4090 Unilateral inguinal hernia, without obstruction or gangrene, not specified as recurrent: Secondary | ICD-10-CM | POA: Diagnosis not present

## 2017-12-04 DIAGNOSIS — K449 Diaphragmatic hernia without obstruction or gangrene: Secondary | ICD-10-CM | POA: Diagnosis not present

## 2017-12-04 DIAGNOSIS — F17200 Nicotine dependence, unspecified, uncomplicated: Secondary | ICD-10-CM | POA: Diagnosis not present

## 2017-12-04 MED ORDER — OXYCODONE HCL 5 MG PO TABS: 5.0000 mg | ORAL_TABLET | Freq: Four times a day (QID) | ORAL | 0 refills | Status: DC | PRN

## 2017-12-04 NOTE — Progress Notes (Signed)
Patient ID: Brittany Mays, female   DOB: [birth year], 77 y.o.   MRN: [id number]   Complained of moderate incisional pain last night now improved with ambulation Abdomen soft on exam and incision looks good without hematoma  Plan: Discharge home

## 2017-12-04 NOTE — Discharge Instructions (Signed)
[facility] _______[facility] ° °UMBILICAL OR INGUINAL HERNIA REPAIR: POST OP INSTRUCTIONS ° °Always review your discharge instruction sheet given to you by the facility where your surgery was performed. °IF YOU HAVE DISABILITY OR FAMILY LEAVE FORMS, YOU MUST BRING THEM TO THE OFFICE FOR PROCESSING.   °DO NOT GIVE THEM TO YOUR DOCTOR. ° °1. A  prescription for pain medication may be given to you upon discharge.  Take your pain medication as prescribed, if needed.  If narcotic pain medicine is not needed, then you may take acetaminophen (Tylenol) or ibuprofen (Advil) as needed. °2. Take your usually prescribed medications unless otherwise directed. °If you need a refill on your pain medication, please contact your pharmacy.  They will contact our office to request authorization. Prescriptions will not be filled after 5 pm or on week-ends. °3. You should follow a light diet the first 24 hours after arrival home, such as soup and crackers, etc.  Be sure to include lots of fluids daily.  Resume your normal diet the day after surgery. °4.Most patients will experience some swelling and bruising around the umbilicus or in the groin and scrotum.  Ice packs and reclining will help.  Swelling and bruising can take several days to resolve.  °6. It is common to experience some constipation if taking pain medication after surgery.  Increasing fluid intake and taking a stool softener (such as Colace) will usually help or prevent this problem from occurring.  A mild laxative (Milk of Magnesia or Miralax) should be taken according to package directions if there are no bowel movements after 48 hours. °7. Unless discharge instructions indicate otherwise, you may remove your bandages 24-48 hours after surgery, and you may shower at that time.  You may have steri-strips (small skin tapes) in place directly over the incision.  These strips should be left on the skin for 7-10 days.  If your surgeon used skin glue on the  incision, you may shower in 24 hours.  The glue will flake off over the next 2-3 weeks.  Any sutures or staples will be removed at the office during your follow-up visit. °8. ACTIVITIES:  You may resume regular (light) daily activities beginning the next day--such as daily self-care, walking, climbing stairs--gradually increasing activities as tolerated.  You may have sexual intercourse when it is comfortable.  Refrain from any heavy lifting or straining until approved by your doctor. ° °a.You may drive when you are no longer taking prescription pain medication, you can comfortably wear a seatbelt, and you can safely maneuver your car and apply brakes. °b.RETURN TO WORK:   °_____________________________________________ ° °9.You should see your doctor in the office for a follow-up appointment approximately 2-3 weeks after your surgery.  Make sure that you call for this appointment within a day or two after you arrive home to insure a convenient appointment time. °10.OTHER INSTRUCTIONS: _________________________ °   _____________________________________ ° °WHEN TO CALL YOUR DOCTOR: °1. Fever over 101.0 °2. Inability to urinate °3. Nausea and/or vomiting °4. Extreme swelling or bruising °5. Continued bleeding from incision. °6. Increased pain, redness, or drainage from the incision ° °The clinic staff is available to answer your questions during regular business hours.  Please don’t hesitate to call and ask to speak to one of the nurses for clinical concerns.  If you have a medical emergency, go to the nearest emergency room or call 911.  A surgeon from [facility] is always on call at the hospital ° ° °  [address], [address], [place], [place]  [address] ? ° [address] [address], [place], [place]   [address] °[phone] ? [phone] ? FAX [phone] °Web site: [email or web address] °

## 2017-12-04 NOTE — Discharge Summary (Signed)
Physician Discharge Summary  Patient ID: Brittany Mays MRN: [id number] DOB/AGE: [birth year] 79 y.o.  Admit date: 12/03/2017 Discharge date: 12/04/2017  Admission Diagnoses:  Discharge Diagnoses:  Active Problems:   S/P left inguinal hernia repair   Discharged Condition: good  Hospital Course: uneventful post op recovery.  Discharge home POD#1  Consults: None  Significant Diagnostic Studies:   Treatments: surgery: left inguinal hernia repair with mesh  Discharge Exam: Blood pressure (!) 141/72, pulse 67, temperature 98.5 F (36.9 C), temperature source Oral, resp. rate 18, height 5' (1.524 m), weight 53.1 kg (117 lb), SpO2 99 %. Generally well in appearance, NAD Lungs clear CV RRR Abdomen soft, incision clean  Disposition: 01-Home or Self Care   Allergies as of 12/04/2017      Reactions   Fentanyl Itching, Nausea And Vomiting, Rash, Other (See Comments)   Sweating, duragesic patch only; can tolerate IV, Itching.   Nsaids Diarrhea, Nausea And Vomiting, Rash, Other (See Comments)   Abdominal cramping. "horrible cramps".   Pentothal [thiopental] Nausea And Vomiting, Rash, Other (See Comments)   UNSPECIFIED OTHER REACTION > Had as a child, and was told to never take again      Medication List    TAKE these medications   ALIGN EXTRA STRENGTH PO Take 1 capsule by mouth daily.   cyclobenzaprine 5 MG tablet Commonly known as:  FLEXERIL TAKE 1 TABLET BY MOUTH THREE TIMES DAY AS NEEDED FOR MUSCLE SPASMS   dicyclomine 20 MG tablet Commonly known as:  BENTYL Take 20 mg by mouth every 6 (six) hours as needed for spasms.   docusate sodium 100 MG capsule Commonly known as:  COLACE Take 200 mg by mouth daily.   DULoxetine 60 MG capsule Commonly known as:  CYMBALTA TAKE 1 CAPSULE EVERY DAY   Melatonin 5 MG Caps Take 5 mg by mouth at bedtime.   multivitamin tablet Take 1 tablet by mouth daily.   OMEGA 3 500 PO Take 500 mg by mouth daily.   oxyCODONE 5 MG  immediate release tablet Commonly known as:  Oxy IR/ROXICODONE Take 1-2 tablets (5-10 mg total) by mouth every 6 (six) hours as needed for moderate pain, severe pain or breakthrough pain. What changed:  You were already taking a medication with the same name, and this prescription was added. Make sure you understand how and when to take each.   Oxycodone HCl 10 MG Tabs Take 1 tablet (10 mg total) by mouth every 6 (six) hours as needed. Start taking on:  12/31/2017 What changed:  Another medication with the same name was added. Make sure you understand how and when to take each.   polyethylene glycol packet Commonly known as:  MIRALAX / GLYCOLAX Take 17 g by mouth daily. Mix one tablespoon with 8oz of your favorite juice or water every day until you are having soft formed stools. Then start taking once daily if you didn't have a stool the day before. What changed:    when to take this  reasons to take this  additional instructions   primidone 250 MG tablet Commonly known as:  MYSOLINE Take 250 mg by mouth at bedtime.   Red Yeast Rice 600 MG Tabs Take 1,200 mg by mouth at bedtime.   rosuvastatin 20 MG tablet Commonly known as:  CRESTOR Take 1 tablet (20 mg total) by mouth daily.      Follow-up Information    Coralie Keens, MD. Schedule an appointment as soon as possible for a visit in  3 week(s).   Specialty:  General Surgery Contact information: [address]  [place] [address] [phone]           Signed: Harl Bowie 12/04/2017, 8:19 AM

## 2017-12-04 NOTE — Progress Notes (Signed)
Brittany Mays to be D/C'd  per MD order. Discussed with the patient and all questions fully answered.  VSS, Skin clean, dry and intact without evidence of skin break down, no evidence of skin tears noted.  IV catheter discontinued intact. Site without signs and symptoms of complications. Dressing and pressure applied.  An After Visit Summary was printed and given to the patient. Patient received prescription.  D/c education completed with patient/family including follow up instructions, medication list, d/c activities limitations if indicated, with other d/c instructions as indicated by MD - patient able to verbalize understanding, all questions fully answered.   Patient instructed to return to ED, call 911, or call MD for any changes in condition.   Patient to be escorted via [place], and D/C home via private auto.

## 2017-12-07 ENCOUNTER — Other Ambulatory Visit: Payer: Self-pay

## 2017-12-07 NOTE — Patient Outreach (Signed)
[place] [facility]) Care Management  12/07/2017  BERTHA LOKKEN Aug 18, 1941 [id number]    Referral Date: 12/07/17 Referral Source: [employer] Date of Admission: 12/03/16 Diagnosis: Hernia repair Date of Discharge: 12/04/17 Facility: B and E: [facility]  Outreach attempt # 1 spoke with patient she is able to verify HIPAA. Discussed with patient the reason for the call.  Patient reports that her issues she had are taken care of and she is doing ok.  Patient reports an overnight stay for her hernia repair.  She states that she had more pain than she expected but states that her pain is better today.  Patient reports she did get to call to order [facility] meals.    Social: Patient reports she lives in her home with her spouse.  She states that he has been assisting her.   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  Patient reports she is recovering well from her hernia repair.  Discussed with patient hernia repair recovery and taking it easy. She verbalized understanding.   Medications: Patient able to review all her medications and has no questions or problems.    Appointments: Patient states she has a follow up with the surgeon on either the 7th or 8th of this month.  Patient has transportation to appointment.    Consent: RN CM reviewed [facility] services with patient. Patient declined services as she feels she has all her needs met at this time.  Discussed with patient sending letter and brochure for future reference. Patient is agreeable.    Plan: RN CM will send letter and brochure to patient. RN CM will close case and notify care management assistant of case status.    [name], RN, MSN [facility] Care Management Care Management Coordinator Direct Line [phone] Toll Free:  [phone]  Fax: [phone]

## 2017-12-10 NOTE — Telephone Encounter (Signed)
Call Handled...cdavis

## 2017-12-13 ENCOUNTER — Other Ambulatory Visit: Payer: Self-pay

## 2017-12-13 NOTE — Patient Outreach (Signed)
[place] [facility]) Care Management  12/13/2017  SHAREN YOUNGREN [birth year] [id number]   Telephone call to patient after patient left voice mail with questions. No answer.  HIPAA compliant voice message left.  Plan: RN CM will attempt patient again within 24 hours.    [name], RN, MSN [facility] Care Management Care Management Coordinator Direct Line [phone] Toll Free: [phone]  Fax: [phone]

## 2017-12-14 ENCOUNTER — Other Ambulatory Visit: Payer: Self-pay

## 2017-12-14 NOTE — Patient Outreach (Signed)
[place] [facility]) Care Management  12/14/2017  Brittany Mays May 13, 1941 [id number]   2nd telephone call to patient for follow up.  No answer.  HIPAA compliant voice message left.  Plan: RN CM will send letter to attempt outreach and call again in 10 business days.   [name], RN, MSN [facility] Care Management Care Management Coordinator Direct Line [phone] Toll Free: [phone]  Fax: [phone]

## 2017-12-14 NOTE — Patient Outreach (Signed)
[place] [facility]) Care Management  [place]  12/14/2017   Brittany Mays [birth year] [id number]  Subjective: Incoming call from patient.  Patient reports she had questions about her life alert system.  Patient states she has two life alerts that have been discontinued. She states that she is no longer having a case manager from [place] to call.  Advised patient that I could get her information on life alert systems.  Patient not interested in  information about buying another life alert but wants information if [employer] covers life alert anymore and if so what company.  Advised patient that would be a benefit coverage question.  She states that she will call her customer service number and ask about benefit coverage.     Patient reports she has a history of GI and back issues and her GI issues are bothering her more since her surgery due to constipation. Discussed with patient how surgery affects the GI tract and slows things down and recommended things she could do to assist.  Patient states she has a call into the GI doctor for advisement on any further things to add.  She currently takes colace twice a day and if she does not have a bowel movement she takes the miralax.  Patient has been for surgeon follow up this week.    Discussed with patient call CM made to her last week but patient states she does not remember it as she has so much on her but remembers getting the 24 hour nurse line number.  Discussed with patient [facility] services.  Patient agreeable to services as she wants to be able to ask questions.      Objective:   Encounter Medications:  Outpatient Encounter Medications as of 12/14/2017  Medication Sig Note  . cyclobenzaprine (FLEXERIL) 5 MG tablet TAKE 1 TABLET BY MOUTH THREE TIMES DAY AS NEEDED FOR MUSCLE SPASMS   . dicyclomine (BENTYL) 20 MG tablet Take 20 mg by mouth every 6 (six) hours as needed for spasms.    Marland Kitchen docusate sodium (COLACE) 100 MG capsule Take 200 mg by  mouth daily. 12/07/2017: Taking 100 mg BID  . DULoxetine (CYMBALTA) 60 MG capsule TAKE 1 CAPSULE EVERY DAY   . Melatonin 5 MG CAPS Take 5 mg by mouth at bedtime.   . Multiple Vitamin (MULTIVITAMIN) tablet Take 1 tablet by mouth daily.   . Omega-3 Fatty Acids (OMEGA 3 500 PO) Take 500 mg by mouth daily.   Marland Kitchen oxyCODONE (OXY IR/ROXICODONE) 5 MG immediate release tablet Take 1-2 tablets (5-10 mg total) by mouth every 6 (six) hours as needed for moderate pain, severe pain or breakthrough pain.   Derrill Memo ON 12/31/2017] Oxycodone HCl 10 MG TABS Take 1 tablet (10 mg total) by mouth every 6 (six) hours as needed.   . polyethylene glycol (MIRALAX / GLYCOLAX) packet Take 17 g by mouth daily. Mix one tablespoon with 8oz of your favorite juice or water every day until you are having soft formed stools. Then start taking once daily if you didn't have a stool the day before. (Patient taking differently: Take 17 g by mouth daily as needed for mild constipation. Mix one tablespoon with 8oz of your favorite juice or water every day until you are having soft formed stools. Then start taking once daily if you didn't have a stool the day before.)   . primidone (MYSOLINE) 250 MG tablet Take 25 mg by mouth at bedtime.  12/07/2017: Taking 25 mg Daily  .  Probiotic Product (ALIGN EXTRA STRENGTH PO) Take 1 capsule by mouth daily.    . rosuvastatin (CRESTOR) 20 MG tablet Take 1 tablet (20 mg total) by mouth daily.   . Red Yeast Rice 600 MG TABS Take 1,200 mg by mouth at bedtime.     No facility-administered encounter medications on file as of 12/14/2017.     Functional Status:  In your present state of health, do you have any difficulty performing the following activities: 12/14/2017 12/03/2017  Hearing? N N  Vision? N N  Difficulty concentrating or making decisions? N N  Walking or climbing stairs? N N  Dressing or bathing? N N  Doing errands, shopping? N N  Preparing Food and eating ? N -  Using the Toilet? N -  In the past  six months, have you accidently leaked urine? N -  Do you have problems with loss of bowel control? N -  Managing your Medications? N -  Managing your Finances? N -  Housekeeping or managing your Housekeeping? N -  Some recent data might be hidden    Fall/Depression Screening: Fall Risk  12/14/2017 11/15/2017 10/08/2017  Falls in the past year? No No No  Number falls in past yr: - - -  Injury with Fall? - - -   PHQ 2/9 Scores 12/14/2017 11/15/2017 10/08/2017 08/09/2017 05/29/2017 04/24/2017 04/16/2017  PHQ - 2 Score 1 0 0 0 0 0 0  PHQ- 9 Score - - - - - - -  Exception Documentation - - - - - - -    Assessment: Patient will benefit from CM outreach for education and support post-op.  Plan:  [facility] CM Care Plan Problem One     Most Recent Value  Care Plan Problem One  Recent Hernia Repair  Role Documenting the Problem One  Care Management Telephonic Coordinator  Care Plan for Problem One  Active  THN Long Term Goal   Patient will not readmit to the hospital within the next 31 days.    THN Long Term Goal Start Date  12/14/17  Interventions for Problem One Long Term Goal  RN CM discussed with patient signs of infection to surgical site.  RN CM discussed if problems with excruciating abdominal pain returning to the hospital.    [facility] CM Short Term Goal #1   Patient will not have infection at surgical site within 14 days.  THN CM Short Term Goal #1 Start Date  12/14/17  Interventions for Short Term Goal #1  RN CM discussed with patient signs of surgical site infection.    THN CM Short Term Goal #2   Patient will report less episodes of constipation within 14 days.  THN CM Short Term Goal #2 Start Date  12/14/17  Interventions for Short Term Goal #2  RN CM discussed with patient affects of surgery on bowel tract and body in general.       RN CM will provide post-op education and support for patient through weekly phone calls.  RN CM will send welcome packet with consent to patient. RN CM will send  initial barriers letter, assessment, and care plan to primary care physician.  RN CM will contact patient next week and patient agrees to next contact.   [name], RN, MSN [facility] Care Management Care Management Coordinator Direct Line [phone] Toll Free: [phone]  Fax: [phone]

## 2017-12-20 ENCOUNTER — Other Ambulatory Visit: Payer: Self-pay

## 2017-12-20 NOTE — Patient Outreach (Signed)
[place] [facility]) Care Management  [place]  12/20/2017   Brittany Mays 1940-12-06 [id number]  Subjective: Telephone call to patient for weekly call.  Patient reports that her stomach still in an up roar and that her hernia repair site is quite sore.  Patient states she is taking the miralax daily, colace twice a day, and probiotics but having bowel movements in pieces. Patient to see Gastroenterologist on Tuesday and patient adds that they want to do a colonoscopy on her but they want to wait until May since patient just had recent hernia surgery.  Encouraged patient to remain positive and discussed signs of infection and if she has excruciating abdominal pain she needs to return to the hospital.  She verbalized understanding.  Objective:   Encounter Medications:  Outpatient Encounter Medications as of 12/20/2017  Medication Sig Note  . cyclobenzaprine (FLEXERIL) 5 MG tablet TAKE 1 TABLET BY MOUTH THREE TIMES DAY AS NEEDED FOR MUSCLE SPASMS   . dicyclomine (BENTYL) 20 MG tablet Take 20 mg by mouth every 6 (six) hours as needed for spasms.    Marland Kitchen docusate sodium (COLACE) 100 MG capsule Take 200 mg by mouth daily. 12/07/2017: Taking 100 mg BID  . DULoxetine (CYMBALTA) 60 MG capsule TAKE 1 CAPSULE EVERY DAY   . Melatonin 5 MG CAPS Take 5 mg by mouth at bedtime.   . Multiple Vitamin (MULTIVITAMIN) tablet Take 1 tablet by mouth daily.   . Omega-3 Fatty Acids (OMEGA 3 500 PO) Take 500 mg by mouth daily.   Marland Kitchen oxyCODONE (OXY IR/ROXICODONE) 5 MG immediate release tablet Take 1-2 tablets (5-10 mg total) by mouth every 6 (six) hours as needed for moderate pain, severe pain or breakthrough pain.   Derrill Memo ON 12/31/2017] Oxycodone HCl 10 MG TABS Take 1 tablet (10 mg total) by mouth every 6 (six) hours as needed.   . polyethylene glycol (MIRALAX / GLYCOLAX) packet Take 17 g by mouth daily. Mix one tablespoon with 8oz of your favorite juice or water every day until you are having soft  formed stools. Then start taking once daily if you didn't have a stool the day before. (Patient taking differently: Take 17 g by mouth daily as needed for mild constipation. Mix one tablespoon with 8oz of your favorite juice or water every day until you are having soft formed stools. Then start taking once daily if you didn't have a stool the day before.)   . primidone (MYSOLINE) 250 MG tablet Take 25 mg by mouth at bedtime.  12/07/2017: Taking 25 mg Daily  . Probiotic Product (ALIGN EXTRA STRENGTH PO) Take 1 capsule by mouth daily.    . Red Yeast Rice 600 MG TABS Take 1,200 mg by mouth at bedtime.    . rosuvastatin (CRESTOR) 20 MG tablet Take 1 tablet (20 mg total) by mouth daily.    No facility-administered encounter medications on file as of 12/20/2017.     Functional Status:  In your present state of health, do you have any difficulty performing the following activities: 12/14/2017 12/03/2017  Hearing? N N  Vision? N N  Difficulty concentrating or making decisions? N N  Walking or climbing stairs? N N  Dressing or bathing? N N  Doing errands, shopping? N N  Preparing Food and eating ? N -  Using the Toilet? N -  In the past six months, have you accidently leaked urine? N -  Do you have problems with loss of bowel control? N -  Managing your Medications? N -  Managing your Finances? N -  Housekeeping or managing your Housekeeping? N -  Some recent data might be hidden    Fall/Depression Screening: Fall Risk  12/14/2017 11/15/2017 10/08/2017  Falls in the past year? No No No  Number falls in past yr: - - -  Injury with Fall? - - -   PHQ 2/9 Scores 12/14/2017 11/15/2017 10/08/2017 08/09/2017 05/29/2017 04/24/2017 04/16/2017  PHQ - 2 Score 1 0 0 0 0 0 0  PHQ- 9 Score - - - - - - -  Exception Documentation - - - - - - -    Assessment: Patient continues to need reinforcement and encouragement for her disease processes.    Plan:  [facility] CM Care Plan Problem One     Most Recent Value  Care Plan  Problem One  Recent Hernia Repair  Role Documenting the Problem One  Care Management Telephonic Coordinator  Care Plan for Problem One  Active  THN Long Term Goal   Patient will not readmit to the hospital within the next 31 days.    THN Long Term Goal Start Date  12/14/17  Interventions for Problem One Long Term Goal  Patient able to name sifgns of infection and advised on returning to the hospital if having excruciating abdominal pain.    THN CM Short Term Goal #1   Patient will not have infection at surgical site within 14 days.  THN CM Short Term Goal #1 Start Date  12/14/17  Interventions for Short Term Goal #1  Patient able to name signs of infection.    THN CM Short Term Goal #2   Patient will report less episodes of constipation within 14 days.  THN CM Short Term Goal #2 Start Date  12/14/17  Interventions for Short Term Goal #2  Patient taking miralax daily and colace twice a day for constipation.   THN CM Short Term Goal #3  Patient will go to appointment with GI specialist next week.    THN CM Short Term Goal #3 Start Date  12/20/17  Interventions for Short Tern Goal #3  RN CM discussed importance of follow up appointment.        RN CM will contact patient next week for weekly call and patient agrees to next outreach.    [name], RN, MSN [facility] Care Management Care Management Coordinator Direct Line [phone] Toll Free: [phone]  Fax: [phone]

## 2017-12-25 DIAGNOSIS — R1084 Generalized abdominal pain: Secondary | ICD-10-CM | POA: Diagnosis not present

## 2017-12-25 DIAGNOSIS — R194 Change in bowel habit: Secondary | ICD-10-CM | POA: Diagnosis not present

## 2017-12-27 ENCOUNTER — Other Ambulatory Visit: Payer: Self-pay

## 2017-12-27 NOTE — Patient Outreach (Signed)
[place] [facility]) Care Management  [place]  12/27/2017   AILED DEFIBAUGH 09-Mar-1941 [id number]  Subjective: Telephone call to patient for final transition of care call. Patient reports she is doing pretty good.  She states she saw the GI doctor and had some recent changes to GI regimen by adding bentyl day and night.  She states that there is a difference in how her stomach feels and that she is having a bowel movement daily. She states that her bowel movements is in pieces but is having one at least daily.  She is happy about that.  Patient states she is to call the MD to report how she is doing.  Patient states that she and her husband are considering moving to some type of assisted living apartment but they can not afford much as they are on a fixed income.  Did give her information on [facility] in [place] and discussed having the social worker call for other resource ideas in her area.  She is in agreement.  Advised patient that I would not be calling weekly as she is doing some better and that I would call in March.  Patient in agreement and thankful for calls.    Objective:   Encounter Medications:  Outpatient Encounter Medications as of 12/27/2017  Medication Sig Note  . cyclobenzaprine (FLEXERIL) 5 MG tablet TAKE 1 TABLET BY MOUTH THREE TIMES DAY AS NEEDED FOR MUSCLE SPASMS   . dicyclomine (BENTYL) 20 MG tablet Take 20 mg by mouth every 6 (six) hours as needed for spasms.  12/27/2017: Patient taking morning and night.  . docusate sodium (COLACE) 100 MG capsule Take 200 mg by mouth daily. 12/07/2017: Taking 100 mg BID  . DULoxetine (CYMBALTA) 60 MG capsule TAKE 1 CAPSULE EVERY DAY   . Melatonin 5 MG CAPS Take 5 mg by mouth at bedtime.   . Multiple Vitamin (MULTIVITAMIN) tablet Take 1 tablet by mouth daily.   . Omega-3 Fatty Acids (OMEGA 3 500 PO) Take 500 mg by mouth daily.   Marland Kitchen oxyCODONE (OXY IR/ROXICODONE) 5 MG immediate release tablet Take 1-2 tablets (5-10 mg  total) by mouth every 6 (six) hours as needed for moderate pain, severe pain or breakthrough pain.   Derrill Memo ON 12/31/2017] Oxycodone HCl 10 MG TABS Take 1 tablet (10 mg total) by mouth every 6 (six) hours as needed.   . polyethylene glycol (MIRALAX / GLYCOLAX) packet Take 17 g by mouth daily. Mix one tablespoon with 8oz of your favorite juice or water every day until you are having soft formed stools. Then start taking once daily if you didn't have a stool the day before. (Patient taking differently: Take 17 g by mouth daily as needed for mild constipation. Mix one tablespoon with 8oz of your favorite juice or water every day until you are having soft formed stools. Then start taking once daily if you didn't have a stool the day before.)   . primidone (MYSOLINE) 250 MG tablet Take 25 mg by mouth at bedtime.  12/07/2017: Taking 25 mg Daily  . Probiotic Product (ALIGN EXTRA STRENGTH PO) Take 1 capsule by mouth daily.    . Red Yeast Rice 600 MG TABS Take 1,200 mg by mouth at bedtime.    . rosuvastatin (CRESTOR) 20 MG tablet Take 1 tablet (20 mg total) by mouth daily.    No facility-administered encounter medications on file as of 12/27/2017.     Functional Status:  In your present state  of health, do you have any difficulty performing the following activities: 12/14/2017 12/03/2017  Hearing? N N  Vision? N N  Difficulty concentrating or making decisions? N N  Walking or climbing stairs? N N  Dressing or bathing? N N  Doing errands, shopping? N N  Preparing Food and eating ? N -  Using the Toilet? N -  In the past six months, have you accidently leaked urine? N -  Do you have problems with loss of bowel control? N -  Managing your Medications? N -  Managing your Finances? N -  Housekeeping or managing your Housekeeping? N -  Some recent data might be hidden    Fall/Depression Screening: Fall Risk  12/14/2017 11/15/2017 10/08/2017  Falls in the past year? No No No  Number falls in past yr: - - -   Injury with Fall? - - -   PHQ 2/9 Scores 12/14/2017 11/15/2017 10/08/2017 08/09/2017 05/29/2017 04/24/2017 04/16/2017  PHQ - 2 Score 1 0 0 0 0 0 0  PHQ- 9 Score - - - - - - -  Exception Documentation - - - - - - -     Assessment: Patient continues to benefit from care manager outreach for disease management and support.   Plan:  [facility] CM Care Plan Problem One     Most Recent Value  Care Plan Problem One  Recent Hernia Repair  Role Documenting the Problem One  Care Management Telephonic Coordinator  Care Plan for Problem One  Active  THN Long Term Goal   Patient will not readmit to the hospital within the next 31 days.    THN Long Term Goal Start Date  12/14/17  Interventions for Problem One Long Term Goal  Patient able to name signs of infection.    THN CM Short Term Goal #1   Patient will not have infection at surgical site within 14 days.  THN CM Short Term Goal #1 Start Date  12/14/17  [facility] CM Short Term Goal #1 Met Date  12/27/17  THN CM Short Term Goal #2   Patient will report less episodes of constipation within 30 days.  THN CM Short Term Goal #2 Start Date  12/14/17  Interventions for Short Term Goal #2  Patient has recently seen GI band change in bowel regimen.    THN CM Short Term Goal #3  Patient will go to appointment with GI specialist next week.    THN CM Short Term Goal #3 Start Date  12/20/17  [facility] CM Short Term Goal #3 Met Date  12/27/17  Interventions for Short Tern Goal #3  patient saw GI on 12-25-17     RN CM will refer to social work to begin process of looking for some type of assisted living apartment.  RN CM will contact patient in the month of March and patient agrees to next outreach.  [name], RN, MSN [facility] Care Management Care Management Coordinator Direct Line [phone] Toll Free: [phone]  Fax: [phone]

## 2017-12-31 SURGERY — Surgical Case
Anesthesia: *Unknown

## 2018-01-04 ENCOUNTER — Telehealth: Payer: Self-pay | Admitting: Family Medicine

## 2018-01-04 DIAGNOSIS — R109 Unspecified abdominal pain: Secondary | ICD-10-CM

## 2018-01-04 DIAGNOSIS — R194 Change in bowel habit: Secondary | ICD-10-CM

## 2018-01-04 NOTE — Telephone Encounter (Signed)
Patient states she is not satisfied with [facility], she would like to be referred to a GI in [place] that would do procedures at [facility] not [place]

## 2018-01-04 NOTE — Telephone Encounter (Signed)
Copied from [place] [phone]. Topic: Referral - Request >> Jan 04, 2018  9:55 AM Corie Chiquito, NT wrote: Reason for CRM: Patient would like to have a referral to a different GI doctor. Stated that she is getting no where with her current GI doctor. She would like to speak with Dr.Sonnenberg's nurse about this as well. She can be reached at [phone]

## 2018-01-07 NOTE — Telephone Encounter (Signed)
Referral placed to  GI in [place]

## 2018-01-08 ENCOUNTER — Encounter: Payer: Self-pay | Admitting: Nurse Practitioner

## 2018-01-08 ENCOUNTER — Other Ambulatory Visit: Payer: Self-pay | Admitting: *Deleted

## 2018-01-08 ENCOUNTER — Other Ambulatory Visit: Payer: Self-pay

## 2018-01-08 NOTE — Patient Outreach (Signed)
[facility]) Care Management  [place]  01/08/2018   Brittany [facility] 12-17-1940 [id number]  Subjective: Telephone call to patient for follow up. Patient reports she continues her current bowel regimen.  She reports that it is working for her. She states she is working to have more formed stool so she is taking miralax every other day.Patient reports she does not have any problems with her hernia site. Patient offers no concerns.    Objective:   Encounter Medications:  Outpatient Encounter Medications as of 01/08/2018  Medication Sig Note  . cyclobenzaprine (FLEXERIL) 5 MG tablet TAKE 1 TABLET BY MOUTH THREE TIMES DAY AS NEEDED FOR MUSCLE SPASMS   . dicyclomine (BENTYL) 20 MG tablet Take 20 mg by mouth every 6 (six) hours as needed for spasms.  12/27/2017: Patient taking morning and night.  . docusate sodium (COLACE) 100 MG capsule Take 200 mg by mouth daily. 12/07/2017: Taking 100 mg BID  . DULoxetine (CYMBALTA) 60 MG capsule TAKE 1 CAPSULE EVERY DAY   . Melatonin 5 MG CAPS Take 5 mg by mouth at bedtime.   . Multiple Vitamin (MULTIVITAMIN) tablet Take 1 tablet by mouth daily.   . Omega-3 Fatty Acids (OMEGA 3 500 PO) Take 500 mg by mouth daily.   Marland Kitchen oxyCODONE (OXY IR/ROXICODONE) 5 MG immediate release tablet Take 1-2 tablets (5-10 mg total) by mouth every 6 (six) hours as needed for moderate pain, severe pain or breakthrough pain.   . polyethylene glycol (MIRALAX / GLYCOLAX) packet Take 17 g by mouth daily. Mix one tablespoon with 8oz of your favorite juice or water every day until you are having soft formed stools. Then start taking once daily if you didn't have a stool the day before. (Patient taking differently: Take 17 g by mouth daily as needed for mild constipation. Mix one tablespoon with 8oz of your favorite juice or water every day until you are having soft formed stools. Then start taking once daily if you didn't have a stool the day before.)   . primidone  (MYSOLINE) 250 MG tablet Take 25 mg by mouth at bedtime.  12/07/2017: Taking 25 mg Daily  . Probiotic Product (ALIGN EXTRA STRENGTH PO) Take 1 capsule by mouth daily.    . Red Yeast Rice 600 MG TABS Take 1,200 mg by mouth at bedtime.    . rosuvastatin (CRESTOR) 20 MG tablet Take 1 tablet (20 mg total) by mouth daily.   . Oxycodone HCl 10 MG TABS Take 1 tablet (10 mg total) by mouth every 6 (six) hours as needed.    No facility-administered encounter medications on file as of 01/08/2018.     Functional Status:  In your present state of health, do you have any difficulty performing the following activities: 12/14/2017 12/03/2017  Hearing? N N  Vision? N N  Difficulty concentrating or making decisions? N N  Walking or climbing stairs? N N  Dressing or bathing? N N  Doing errands, shopping? N N  Preparing Food and eating ? N -  Using the Toilet? N -  In the past six months, have you accidently leaked urine? N -  Do you have problems with loss of bowel control? N -  Managing your Medications? N -  Managing your Finances? N -  Housekeeping or managing your Housekeeping? N -  Some recent data might be hidden    Fall/Depression Screening: Fall Risk  12/14/2017 11/15/2017 10/08/2017  Falls in the past year? No No No  Number falls in past yr: - - -  Injury with Fall? - - -   PHQ 2/9 Scores 12/14/2017 11/15/2017 10/08/2017 08/09/2017 05/29/2017 04/24/2017 04/16/2017  PHQ - 2 Score 1 0 0 0 0 0 0  PHQ- 9 Score - - - - - - -  Exception Documentation - - - - - - -    Assessment: Patient continues to benefit from care manager outreach for disease management and support.    Plan:  [facility] CM Care Plan Problem One     Most Recent Value  Care Plan Problem One  Recent Hernia Repair  Role Documenting the Problem One  Care Management Telephonic Coordinator  Care Plan for Problem One  Active  [facility] Long Term Goal   Patient will not readmit to the hospital within the next 31 days.    [facility]N Long Term Goal Start Date   12/14/17  [facility] Long Term Goal Met Date  01/08/18  Interventions for Problem One Long Term Goal  No readmits.    THN CM Short Term Goal #2   Patient will report less episodes of constipation within 30 days.  [facility] CM Short Term Goal #2 Start Date  01/08/18  Interventions for Short Term Goal #2  Patient continues to follow new bowel regimen.  Patient reports she is working to get more formed bowel movements.      RN CM will contact patient again in the month of March and patient agrees to next outreach.    [name], RN, MSN [facility] Care Management Care Management Coordinator Direct Line [phone] Toll Free: [phone]  Fax: [phone]

## 2018-01-08 NOTE — Patient Outreach (Signed)
[facility]) Care Management  01/08/2018  DEVONA HOLMES 02-May-1941 [id number]   CSW had received referral from [place] for resources on assisted livings. CSW called & spoke with patient to discuss options, patient reports that she and her husband are currently living in a 1 floor house and are looking to downsize to a senior apartment or independent living. Patient states that she had called [facility] in [place] but that they only had 1 bedroom apartments available and they would be interested in a 2 bedroom apartment. CSW provided patient with the phone number to [facility] and encouraged her to call there as they do not require a "buy-in" fee. CSW will also mail patient information on [facility] in [place] and [facility] in [place]. CSW will check back with patient in 2 weeks.    Raynaldo Opitz, LCSW [facility]  Clinical Social Worker cell #: [phone]

## 2018-01-16 ENCOUNTER — Telehealth (INDEPENDENT_AMBULATORY_CARE_PROVIDER_SITE_OTHER): Payer: Self-pay | Admitting: Orthopaedic Surgery

## 2018-01-16 ENCOUNTER — Ambulatory Visit (INDEPENDENT_AMBULATORY_CARE_PROVIDER_SITE_OTHER): Payer: Medicare HMO

## 2018-01-16 ENCOUNTER — Encounter (INDEPENDENT_AMBULATORY_CARE_PROVIDER_SITE_OTHER): Payer: Self-pay | Admitting: Orthopaedic Surgery

## 2018-01-16 ENCOUNTER — Ambulatory Visit (INDEPENDENT_AMBULATORY_CARE_PROVIDER_SITE_OTHER): Payer: Medicare PPO | Admitting: Orthopaedic Surgery

## 2018-01-16 DIAGNOSIS — M7062 Trochanteric bursitis, left hip: Secondary | ICD-10-CM | POA: Diagnosis not present

## 2018-01-16 DIAGNOSIS — Z96642 Presence of left artificial hip joint: Secondary | ICD-10-CM

## 2018-01-16 MED ORDER — METHYLPREDNISOLONE ACETATE 40 MG/ML IJ SUSP
40.0000 mg | INTRAMUSCULAR | Status: AC | PRN
  Administered 2018-01-16: 40 mg via INTRA_ARTICULAR

## 2018-01-16 MED ORDER — LIDOCAINE HCL 1 % IJ SOLN
3.0000 mL | INTRAMUSCULAR | Status: AC | PRN
  Administered 2018-01-16: 3 mL

## 2018-01-16 NOTE — Progress Notes (Signed)
Office Visit Note   Patient: Brittany [facility]           Date of Birth: [birth year]           MRN: [id number] Visit Date: 01/16/2018              Requested by: [name], MD [address] [place], [place] [address] PCP: [name], MD   Assessment & Plan: Visit Diagnoses:  1. History of left hip replacement   2. Trochanteric bursitis, left hip     Plan: I do feel that this is more of a trochanteric bursitis.  I explained the reasoning behind this and showed her stretching exercises to try.  She will not sleep on the side at night.  I did offer her a steroid injection of the trochanteric.  She agreed to this and she tolerated it well.  We will see her back in 6 months to see how she is doing overall.  I would like just a low AP pelvis at that visit.  Follow-Up Instructions: Return in about 6 months (around 07/19/2018).   Orders:  Orders Placed This Encounter  Procedures  . Large Joint Inj  . XR HIP UNILAT W OR W/O PELVIS 1V LEFT   No orders of the defined types were placed in this encounter.     Procedures: Large Joint Inj: L greater trochanter on 01/16/2018 3:22 PM Indications: pain and diagnostic evaluation Details: 22 G 1.5 in needle, lateral approach  Arthrogram: No  Medications: 3 mL lidocaine 1 %; 40 mg methylPREDNISolone acetate 40 MG/ML Outcome: tolerated well, no immediate complications Procedure, treatment alternatives, risks and benefits explained, specific risks discussed. Consent was given by the patient. Immediately prior to procedure a time out was called to verify the correct patient, procedure, equipment, support staff and site/side marked as required. Patient was prepped and draped in the usual sterile fashion.       Clinical Data: No additional findings.   Subjective: Chief Complaint  Patient presents with  . Left Hip - Pain  The patient is 4 months out from a left total hip arthroplasty.  This is through direct anterior  approach.  She has had some pain in her left hip for about 2-3 weeks now she points the side of her hip as a source of her pain with no new injury.  She was concerned about this and wanted to be checked out.  He is very active 77 years old.  She did actually have hernia surgery on that same side just in January of this year.  She has had back surgery in the past as well.  There is no groin pain.  She denies any numbness and tingling or weakness.  She has not had any recent illness.  HPI  Review of Systems Denies any chest pain headache shortness of breath, fever, chills, nausea, vomiting.  Objective: Vital Signs: There were no vitals taken for this visit.  Physical Exam She is alert and oriented x3 and in no acute distress Ortho Exam Examination of her left hip shows fluid range of motion of that hip.  She has significant pain over the trochanteric area of the hip.  Her incisions well-healed. Specialty Comments:  No specialty comments available.  Imaging: Xr Hip Unilat W Or W/o Pelvis 1v Left  Result Date: 01/16/2018 An AP pelvis and lateral of the left hip shows a well-seated total hip arthroplasty with no complicating features or acute findings.  PMFS History: Patient Active Problem List   Diagnosis Date Noted  . S/P left inguinal hernia repair 12/03/2017  . Chronic abdominal pain 11/08/2017  . Bochdalek hernia 11/08/2017  . Anemia 09/25/2017  . Status post total replacement of left hip 09/18/2017  . 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 Osteoarthritis of ankle or foot 05/25/2017  . Trochanteric bursitis of left hip 05/25/2017  . Osteoarthritis of hip (Left) 04/25/2017  . Acute postoperative pain 02/14/2017  . Musculoskeletal pain 01/23/2017  . Tremor 01/10/2017  . Chronic pain syndrome 11/13/2016  . Chronic upper back pain (midline) 09/13/2016  . Lumbar spondylosis 08/15/2016  . Lumbar facet syndrome (Bilateral) (R>L) 07/19/2016  . Chronic groin pain  (Location of Tertiary source of pain) (Left) 07/13/2016  . 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 Opioid-induced constipation (OIC) 07/12/2016  . Osteoarthritis, multiple sites 07/11/2016  . Chronic low back pain (Location of Primary Source of Pain) (Bilateral) (R>L) 07/11/2016  . 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 Chronic knee pain (Bilateral) (L>R) 07/11/2016  . Long term current use of opiate analgesic 07/11/2016  . Long term prescription opiate use 07/11/2016  . Opiate use (60 MME/Day) 07/11/2016  . Encounter for pain management planning 07/11/2016  . Encounter for therapeutic drug level monitoring 07/11/2016  . Abnormal CT scan, lumbar spine (05/07/2015) 07/11/2016  . Osteoarthritis of sacroiliac joint (Bilateral) 07/11/2016  . Lumbar facet arthropathy ([place]) 07/11/2016  . Lumbar spondylolisthesis 07/11/2016  . Lumbar foraminal stenosis (Bilateral) 07/11/2016  . History of total bilateral knee replacement 07/11/2016  . Aortic aneurysm ([place]) 06/06/2016  . Hyperlipidemia 03/06/2016  . Chronic hip pain (Location of Secondary source of pain) (Left) 12/06/2015  . Pulmonary nodule 09/10/2015  . Mechanical complication of internal fixation device such as nail, plate or rod ([place]) 56/38/7564  . 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 Pseudarthrosis following spinal fusion 07/29/2015  . Generalized weakness 06/02/2014  . History of lumbar fusion (T10 through L3 fusion) 04/28/2014  . Obesity (BMI 30-39.9) 06/23/2013  . Anxiety and depression   . Lumbar spinal stenosis    Past Medical History:  Diagnosis Date  . Adverse effect of anesthetic    after hip replacement had amnesia for days  . Anxiety   . Aortic aneurysm ([place]) 06/06/2016   small ascending aneurysm   . Cataract   . Chest pain 08/18/2015  . Complication of anesthesia    as a teenager pt. had surgery and surgeon told parents that she should never have pentathal ever again  .  Constipation due to opioid therapy   . Decreased muscle strength 02/10/2014  . Depression   . Difficulty in walking(719.7) 02/10/2014  . Dysphagia 08/19/2015  . Dyspnea 08/19/2015  . GERD (gastroesophageal reflux disease)    occasionally    tums OTC  . History of kidney stones   . Hyperparathyroidism, primary ([place])    s/p parathyroidectomy  . Hyperventilation 08/18/2015  . Insomnia   . Intractable back pain 04/29/2014  . Kidney stone   . Lumbar spinal stenosis    s/p laminectomy  . Odynophagia 08/18/2015  . Osteoarthritis of both knees    s/p knee replacements  . Pain   . Rectal prolapse    s/p repair  . Unintentional weight loss 09/08/2015  . UTI (lower urinary tract infection) 04/28/2014    Family History  Problem Relation Age of Onset  . Mental illness Mother   . COPD Father   . Heart  failure Father   . Diabetes Son     Past Surgical History:  Procedure Laterality Date  . ABDOMINAL HYSTERECTOMY    . APPENDECTOMY    . BACK SURGERY  07-2015    total 4 major back surgeries  . BREAST LUMPECTOMY     Reports this is benign  . COLONOSCOPY  2013  . INGUINAL HERNIA REPAIR Left 12/03/2017   Procedure: LEFT INGUINAL HERNIA REPAIR WITH MESH;  Surgeon: Coralie Keens, MD;  Location: [facility];  Service: General;  Laterality: Left;  LMA  . JOINT REPLACEMENT     only did left hip.  only 1 surgery  . PARATHYROIDECTOMY  1990's  . ROTATOR CUFF REPAIR     right  . SPINE SURGERY    . TONSILLECTOMY    . TOTAL HIP ARTHROPLASTY Left 09/18/2017  . TOTAL HIP ARTHROPLASTY Left 09/18/2017   Procedure: LEFT TOTAL HIP ARTHROPLASTY ANTERIOR APPROACH;  Surgeon: Mcarthur Rossetti, MD;  Location: [facility];  Service: Orthopedics;  Laterality: Left;  . TOTAL KNEE ARTHROPLASTY Bilateral 1990's nd 2002   Social History   Occupational History  . Not on file  Tobacco Use  . Smoking status: Current Every Day Smoker    Packs/day: 0.50    Years: 50.00    Pack years: 25.00    Types: Cigarettes  .  Smokeless tobacco: Never Used  Substance and Sexual Activity  . Alcohol use: No    Alcohol/week: 0.0 oz  . Drug use: No  . Sexual activity: Not Currently

## 2018-01-16 NOTE — Telephone Encounter (Signed)
error 

## 2018-01-22 ENCOUNTER — Other Ambulatory Visit: Payer: Self-pay

## 2018-01-22 ENCOUNTER — Other Ambulatory Visit: Payer: Self-pay | Admitting: *Deleted

## 2018-01-22 NOTE — Patient Outreach (Signed)
[facility]) Care Management  01/22/2018  Brittany Mays 11/16/1940 [id number]   CSW called & spoke with patient to follow-up on independent living facilities. Patient reports that she spoke with [facility] this morning and they are mailing her a packet of information. Patient states that she is not in a rush to move at the moment as they are trying to get their house ready to be put on the market. CSW will check back with patient in 3 weeks to ensure that patients needs are met and encouraged her to call CSW is any other questions arise.    Raynaldo Opitz, LCSW [facility]  Clinical Social Worker cell #: [phone]

## 2018-01-22 NOTE — Patient Outreach (Signed)
[facility]) Care Management  [name]  01/22/2018   Brittany Mays 03-02-1941 [id number]  Subjective: Telephone call to patient for follow up. Patient reports that she is still having some problems with her stomach. Patient states that [facility] referred her to [facility] and her appointment is May 1st but will be calling for any cancellations. Patient reports the feeling in her stomach is hard to describe but she states that her stomach feels hard.  She has increased her bentyl to three times a day, which she says has helped.  Patient also to have physical [facility] at [facility] for some pelvic floor exercises that may help. Patient states she is willing to give it a shot. Patient to have her last follow up next week with the surgeon that did her hernia surgery next week.  Patient states that she saw the doctor that did her hip replacement due to some new pain.  Patient reports she has some bursitis in the hip now.  She states that pain is nothing excruciating but she knows its there.  She rates at a 2/10.  Advised patient to continue her bowel regimen and go for her PT.  She verbalized understanding.  Objective:   Encounter Medications:  Outpatient Encounter Medications as of 01/22/2018  Medication Sig Note  . cyclobenzaprine (FLEXERIL) 5 MG tablet TAKE 1 TABLET BY MOUTH THREE TIMES DAY AS NEEDED FOR MUSCLE SPASMS   . dicyclomine (BENTYL) 20 MG tablet Take 20 mg by mouth every 6 (six) hours as needed for spasms.  01/22/2018: Patient taking TID  . docusate sodium (COLACE) 100 MG capsule Take 200 mg by mouth daily. 12/07/2017: Taking 100 mg BID  . DULoxetine (CYMBALTA) 60 MG capsule TAKE 1 CAPSULE EVERY DAY   . Melatonin 5 MG CAPS Take 5 mg by mouth at bedtime.   . Multiple Vitamin (MULTIVITAMIN) tablet Take 1 tablet by mouth daily.   . Omega-3 Fatty Acids (OMEGA 3 500 PO) Take 500 mg by mouth daily.   Marland Kitchen oxyCODONE (OXY IR/ROXICODONE) 5 MG immediate release tablet Take  1-2 tablets (5-10 mg total) by mouth every 6 (six) hours as needed for moderate pain, severe pain or breakthrough pain.   . polyethylene glycol (MIRALAX / GLYCOLAX) packet Take 17 g by mouth daily. Mix one tablespoon with 8oz of your favorite juice or water every day until you are having soft formed stools. Then start taking once daily if you didn't have a stool the day before. (Patient taking differently: Take 17 g by mouth daily as needed for mild constipation. Mix one tablespoon with 8oz of your favorite juice or water every day until you are having soft formed stools. Then start taking once daily if you didn't have a stool the day before.)   . primidone (MYSOLINE) 250 MG tablet Take 25 mg by mouth at bedtime.  12/07/2017: Taking 25 mg Daily  . Probiotic Product (ALIGN EXTRA STRENGTH PO) Take 1 capsule by mouth daily.    . Red Yeast Rice 600 MG TABS Take 1,200 mg by mouth at bedtime.    . rosuvastatin (CRESTOR) 20 MG tablet Take 1 tablet (20 mg total) by mouth daily.   . Oxycodone HCl 10 MG TABS Take 1 tablet (10 mg total) by mouth every 6 (six) hours as needed.    No facility-administered encounter medications on file as of 01/22/2018.     Functional Status:  In your present state of health, do you have any  difficulty performing the following activities: 12/14/2017 12/03/2017  Hearing? N N  Vision? N N  Difficulty concentrating or making decisions? N N  Walking or climbing stairs? N N  Dressing or bathing? N N  Doing errands, shopping? N N  Preparing Food and eating ? N -  Using the Toilet? N -  In the past six months, have you accidently leaked urine? N -  Do you have problems with loss of bowel control? N -  Managing your Medications? N -  Managing your Finances? N -  Housekeeping or managing your Housekeeping? N -  Some recent data might be hidden    Fall/Depression Screening: Fall Risk  12/14/2017 11/15/2017 10/08/2017  Falls in the past year? No No No  Number falls in past yr: - - -   Injury with Fall? - - -   PHQ 2/9 Scores 12/14/2017 11/15/2017 10/08/2017 08/09/2017 05/29/2017 04/24/2017 04/16/2017  PHQ - 2 Score 1 0 0 0 0 0 0  PHQ- 9 Score - - - - - - -  Exception Documentation - - - - - - -    Assessment: Patient continues to benefit from care manager outreach for disease management and support.    Plan:  [facility] CM Care Plan Problem One     Most Recent Value  Care Plan Problem One  Recent Hernia Repair  Role Documenting the Problem One  Care Management Telephonic Coordinator  Care Plan for Problem One  Active  [facility] Long Term Goal   Patient will not readmit to the [facility] within the next 31 days.    [facility] Long Term Goal Start Date  12/14/17  [facility] Long Term Goal Met Date  01/08/18  [facility] CM Short Term Goal #2   Patient will report less episodes of constipation within 30 days.  [facility] CM Short Term Goal #2 Start Date  01/08/18  Interventions for Short Term Goal #2  Patient has started to take bentyl 3 times a day and has been referred to [facility].  Patient to see therapy for pelvic floor exercises     RN CM will contact patient in the month of April and patient agrees to next outreach.  [name], RN, MSN [facility] Care Management Care Management Coordinator Direct Line [phone] Toll Free: [phone]  Fax: [phone]

## 2018-01-31 ENCOUNTER — Ambulatory Visit: Payer: Medicare HMO | Admitting: Physical Therapy

## 2018-02-04 ENCOUNTER — Other Ambulatory Visit: Payer: Self-pay | Admitting: Family Medicine

## 2018-02-05 ENCOUNTER — Ambulatory Visit: Payer: Medicare HMO | Attending: Nurse Practitioner | Admitting: Nurse Practitioner

## 2018-02-05 ENCOUNTER — Encounter: Payer: Self-pay | Admitting: Nurse Practitioner

## 2018-02-05 ENCOUNTER — Other Ambulatory Visit: Payer: Self-pay

## 2018-02-05 VITALS — BP 104/71 | HR 81 | Temp 98.3°F | Resp 16 | Ht 60.0 in | Wt 115.0 lb

## 2018-02-05 DIAGNOSIS — M47816 Spondylosis without myelopathy or radiculopathy, lumbar region: Secondary | ICD-10-CM | POA: Insufficient documentation

## 2018-02-05 DIAGNOSIS — F329 Major depressive disorder, single episode, unspecified: Secondary | ICD-10-CM | POA: Insufficient documentation

## 2018-02-05 DIAGNOSIS — Z96653 Presence of artificial knee joint, bilateral: Secondary | ICD-10-CM | POA: Diagnosis not present

## 2018-02-05 DIAGNOSIS — R1084 Generalized abdominal pain: Secondary | ICD-10-CM | POA: Diagnosis not present

## 2018-02-05 DIAGNOSIS — M48061 Spinal stenosis, lumbar region without neurogenic claudication: Secondary | ICD-10-CM | POA: Diagnosis not present

## 2018-02-05 DIAGNOSIS — Z886 Allergy status to analgesic agent status: Secondary | ICD-10-CM | POA: Diagnosis not present

## 2018-02-05 DIAGNOSIS — M961 Postlaminectomy syndrome, not elsewhere classified: Secondary | ICD-10-CM | POA: Diagnosis not present

## 2018-02-05 DIAGNOSIS — Z5181 Encounter for therapeutic drug level monitoring: Secondary | ICD-10-CM | POA: Diagnosis not present

## 2018-02-05 DIAGNOSIS — M549 Dorsalgia, unspecified: Secondary | ICD-10-CM | POA: Diagnosis not present

## 2018-02-05 DIAGNOSIS — E785 Hyperlipidemia, unspecified: Secondary | ICD-10-CM | POA: Diagnosis not present

## 2018-02-05 DIAGNOSIS — Z885 Allergy status to narcotic agent status: Secondary | ICD-10-CM | POA: Diagnosis not present

## 2018-02-05 DIAGNOSIS — G8929 Other chronic pain: Secondary | ICD-10-CM | POA: Diagnosis not present

## 2018-02-05 DIAGNOSIS — F17210 Nicotine dependence, cigarettes, uncomplicated: Secondary | ICD-10-CM | POA: Insufficient documentation

## 2018-02-05 DIAGNOSIS — Z6822 Body mass index (BMI) 22.0-22.9, adult: Secondary | ICD-10-CM | POA: Diagnosis not present

## 2018-02-05 DIAGNOSIS — D649 Anemia, unspecified: Secondary | ICD-10-CM | POA: Insufficient documentation

## 2018-02-05 DIAGNOSIS — K219 Gastro-esophageal reflux disease without esophagitis: Secondary | ICD-10-CM | POA: Diagnosis not present

## 2018-02-05 DIAGNOSIS — R109 Unspecified abdominal pain: Secondary | ICD-10-CM | POA: Diagnosis not present

## 2018-02-05 DIAGNOSIS — M1612 Unilateral primary osteoarthritis, left hip: Secondary | ICD-10-CM | POA: Insufficient documentation

## 2018-02-05 DIAGNOSIS — Z79899 Other long term (current) drug therapy: Secondary | ICD-10-CM | POA: Insufficient documentation

## 2018-02-05 DIAGNOSIS — Z79891 Long term (current) use of opiate analgesic: Secondary | ICD-10-CM | POA: Insufficient documentation

## 2018-02-05 DIAGNOSIS — F419 Anxiety disorder, unspecified: Secondary | ICD-10-CM | POA: Diagnosis not present

## 2018-02-05 DIAGNOSIS — E669 Obesity, unspecified: Secondary | ICD-10-CM | POA: Diagnosis not present

## 2018-02-05 DIAGNOSIS — G894 Chronic pain syndrome: Secondary | ICD-10-CM | POA: Insufficient documentation

## 2018-02-05 DIAGNOSIS — M25552 Pain in left hip: Secondary | ICD-10-CM | POA: Diagnosis not present

## 2018-02-05 MED ORDER — OXYCODONE HCL 10 MG PO TABS: 10.0000 mg | ORAL_TABLET | Freq: Four times a day (QID) | ORAL | 0 refills | Status: DC | PRN

## 2018-02-05 NOTE — Progress Notes (Signed)
Nursing Pain Medication Assessment:  Safety precautions to be maintained throughout the outpatient stay will include: orient to surroundings, keep bed in low position, maintain call bell within reach at all times, provide assistance with transfer out of bed and ambulation.  Medication Inspection Compliance: Pill count conducted under aseptic conditions, in front of the patient. Neither the pills nor the bottle was removed from the patient's sight at any time. Once count was completed pills were immediately returned to the patient in their original bottle.  Medication: Oxycodone IR Pill/Patch Count: 10 of 120 pills remain Pill/Patch Appearance: Markings consistent with prescribed medication Bottle Appearance: Standard pharmacy container. Clearly labeled. Filled Date: 02/28/ 2019 Last Medication intake:  Today

## 2018-02-05 NOTE — Patient Outreach (Signed)
[facility]) Care Management  [place]  02/05/2018   Brittany Mays April 09, 1941 [id number]  Subjective: Telephone call to patient for follow up call.  Spoke with patient. She is able to verify HIPAA. Patient reports that she is getting ready to go for an x-ray ordered by GI doctor at [facility].  She states the doctor continues to keep up with things even though she has been referred to [place].  Patient to see doctor at [facility] on April 23rd. Patient reports she still has discomfort in the mornings but by afternoon it is better after bentyl.  Patient still has colonoscopy scheduled for next month.  Asked patient about her constipation.  Patient reports that she has at least a small bowel movement daily but adds she does not eat as much as she used too.  Discussed patient bowel regimen.  She reports she is still following it with no problems. Encouraged patient to continue.  Patient voices no concerns but appreciates calls.    Objective:   Encounter Medications:  Outpatient Encounter Medications as of 02/05/2018  Medication Sig Note  . cyclobenzaprine (FLEXERIL) 5 MG tablet TAKE 1 TABLET BY MOUTH THREE TIMES DAY AS NEEDED FOR MUSCLE SPASMS   . dicyclomine (BENTYL) 20 MG tablet Take 20 mg by mouth every 6 (six) hours as needed for spasms.  01/22/2018: Patient taking TID  . docusate sodium (COLACE) 100 MG capsule Take 200 mg by mouth daily. 12/07/2017: Taking 100 mg BID  . DULoxetine (CYMBALTA) 60 MG capsule TAKE 1 CAPSULE EVERY DAY   . Melatonin 5 MG CAPS Take 5 mg by mouth at bedtime.   . Multiple Vitamin (MULTIVITAMIN) tablet Take 1 tablet by mouth daily.   . Omega-3 Fatty Acids (OMEGA 3 500 PO) Take 500 mg by mouth daily.   Marland Kitchen oxyCODONE (OXY IR/ROXICODONE) 5 MG immediate release tablet Take 1-2 tablets (5-10 mg total) by mouth every 6 (six) hours as needed for moderate pain, severe pain or breakthrough pain.   . polyethylene glycol (MIRALAX / GLYCOLAX) packet Take 17 g by  mouth daily. Mix one tablespoon with 8oz of your favorite juice or water every day until you are having soft formed stools. Then start taking once daily if you didn't have a stool the day before. (Patient taking differently: Take 17 g by mouth daily as needed for mild constipation. Mix one tablespoon with 8oz of your favorite juice or water every day until you are having soft formed stools. Then start taking once daily if you didn't have a stool the day before.)   . primidone (MYSOLINE) 250 MG tablet Take 25 mg by mouth at bedtime.  12/07/2017: Taking 25 mg Daily  . Probiotic Product (ALIGN EXTRA STRENGTH PO) Take 1 capsule by mouth daily.    . Red Yeast Rice 600 MG TABS Take 1,200 mg by mouth at bedtime.    . rosuvastatin (CRESTOR) 20 MG tablet Take 1 tablet (20 mg total) by mouth daily.    No facility-administered encounter medications on file as of 02/05/2018.     Functional Status:  In your present state of health, do you have any difficulty performing the following activities: 12/14/2017 12/03/2017  Hearing? N N  Vision? N N  Difficulty concentrating or making decisions? N N  Walking or climbing stairs? N N  Dressing or bathing? N N  Doing errands, shopping? N N  Preparing Food and eating ? N -  Using the Toilet? N -  In  the past six months, have you accidently leaked urine? N -  Do you have problems with loss of bowel control? N -  Managing your Medications? N -  Managing your Finances? N -  Housekeeping or managing your Housekeeping? N -  Some recent data might be hidden    Fall/Depression Screening: Fall Risk  12/14/2017 11/15/2017 10/08/2017  Falls in the past year? No No No  Number falls in past yr: - - -  Injury with Fall? - - -   PHQ 2/9 Scores 12/14/2017 11/15/2017 10/08/2017 08/09/2017 05/29/2017 04/24/2017 04/16/2017  PHQ - 2 Score 1 0 0 0 0 0 0  PHQ- 9 Score - - - - - - -  Exception Documentation - - - - - - -    Assessment: Patient continues to benefit from care manager outreach  for disease management and support.    Plan:  [facility] CM Care Plan Problem One     Most Recent Value  Care Plan Problem One  Recent Hernia Repair  Role Documenting the Problem One  Care Management Telephonic Coordinator  Care Plan for Problem One  Active  [facility] CM Short Term Goal #2   Patient will report less episodes of constipation within 30 days.  [facility] CM Short Term Goal #2 Start Date  02/05/18  Interventions for Short Term Goal #2  Patient continues to take bentyl, colace, and miralax as bowel regimen     RN CM will contact patient again within one month and patient agrees to next outreach.   [name], RN, MSN [facility] Care Management Care Management Coordinator Direct Line [phone] Toll Free: [phone]  Fax: [phone]

## 2018-02-05 NOTE — Patient Instructions (Addendum)
____________________________________________________________________________________________  Medication Rules  Applies to: All patients receiving prescriptions (written or electronic).  Pharmacy of record: Pharmacy where electronic prescriptions will be sent. If written prescriptions are taken to a different pharmacy, please inform the nursing staff. The pharmacy listed in the electronic medical record should be the one where you would like electronic prescriptions to be sent.  Prescription refills: Only during scheduled appointments. Applies to both, written and electronic prescriptions.  NOTE: The following applies primarily to controlled substances (Opioid* Pain Medications).   Patient's responsibilities: 1. Pain Pills: Bring all pain pills to every appointment (except for procedure appointments). 2. Pill Bottles: Bring pills in original pharmacy bottle. Always bring newest bottle. Bring bottle, even if empty. 3. Medication refills: You are responsible for knowing and keeping track of what medications you need refilled. The day before your appointment, write a list of all prescriptions that need to be refilled. Bring that list to your appointment and give it to the admitting nurse. Prescriptions will be written only during appointments. If you forget a medication, it will not be "Called in", "Faxed", or "electronically sent". You will need to get another appointment to get these prescribed. 4. Prescription Accuracy: You are responsible for carefully inspecting your prescriptions before leaving our office. Have the discharge nurse carefully go over each prescription with you, before taking them home. Make sure that your name is accurately spelled, that your address is correct. Check the name and dose of your medication to make sure it is accurate. Check the number of pills, and the written instructions to make sure they are clear and accurate. Make sure that you are given enough medication to last  until your next medication refill appointment. 5. Taking Medication: Take medication as prescribed. Never take more pills than instructed. Never take medication more frequently than prescribed. Taking less pills or less frequently is permitted and encouraged, when it comes to controlled substances (written prescriptions).  6. Inform other Doctors: Always inform, all of your healthcare providers, of all the medications you take. 7. Pain Medication from other Providers: You are not allowed to accept any additional pain medication from any other Doctor or Healthcare provider. There are two exceptions to this rule. (see below) In the event that you require additional pain medication, you are responsible for notifying us, as stated below. 8. Medication Agreement: You are responsible for carefully reading and following our Medication Agreement. This must be signed before receiving any prescriptions from our practice. Safely store a copy of your signed Agreement. Violations to the Agreement will result in no further prescriptions. (Additional copies of our Medication Agreement are available upon request.) 9. Laws, Rules, & Regulations: All patients are expected to follow all Federal and State Laws, Statutes, Rules, & Regulations. Ignorance of the Laws does not constitute a valid excuse. The use of any illegal substances is prohibited. 10. Adopted CDC guidelines & recommendations: Target dosing levels will be at or below 60 MME/day. Use of benzodiazepines** is not recommended.  Exceptions: There are only two exceptions to the rule of not receiving pain medications from other Healthcare Providers. 1. Exception #1 (Emergencies): In the event of an emergency (i.e.: accident requiring emergency care), you are allowed to receive additional pain medication. However, you are responsible for: As soon as you are able, call our office [phone], at any time of the day or night, and leave a message stating your name, the  date and nature of the emergency, and the name and dose of the medication   prescribed. In the event that your call is answered by a member of our staff, make sure to document and save the date, time, and the name of the person that took your information.  2. Exception #2 (Planned Surgery): In the event that you are scheduled by another doctor or dentist to have any type of surgery or procedure, you are allowed (for a period no longer than 30 days), to receive additional pain medication, for the acute post-op pain. However, in this case, you are responsible for picking up a copy of our "Post-op Pain Management for Surgeons" handout, and giving it to your surgeon or dentist. This document is available at our office, and does not require an appointment to obtain it. Simply go to our office during business hours (Monday-Thursday from 8:00 AM to 4:00 PM) (Friday 8:00 AM to 12:00 Noon) or if you have a scheduled appointment with [place], prior to your surgery, and ask for it by name. In addition, you will need to provide [place] with your name, name of your surgeon, type of surgery, and date of procedure or surgery.  *Opioid medications include: morphine, codeine, oxycodone, oxymorphone, hydrocodone, hydromorphone, meperidine, tramadol, tapentadol, buprenorphine, fentanyl, methadone. **Benzodiazepine medications include: diazepam (Valium), alprazolam (Xanax), clonazepam (Klonopine), lorazepam (Ativan), clorazepate (Tranxene), chlordiazepoxide (Librium), estazolam (Prosom), oxazepam (Serax), temazepam (Restoril), triazolam (Halcion) (Last updated: 01/03/2018) ____________________________________________________________________________________________   Radiofrequency Lesioning Radiofrequency lesioning is a procedure that is performed to relieve pain. The procedure is often used for back, neck, or arm pain. Radiofrequency lesioning involves the use of a machine that creates radio waves to make heat. During the procedure, the  heat is applied to the nerve that carries the pain signal. The heat damages the nerve and interferes with the pain signal. Pain relief usually starts about 2 weeks after the procedure and lasts for 6 months to 1 year. Tell a health care provider about: Any allergies you have. All medicines you are taking, including vitamins, herbs, eye drops, creams, and over-the-counter medicines. Any problems you or family members have had with anesthetic medicines. Any blood disorders you have. Any surgeries you have had. Any medical conditions you have. Whether you are pregnant or may be pregnant. What are the risks? Generally, this is a safe procedure. However, problems may occur, including: Pain or soreness at the injection site. Infection at the injection site. Damage to nerves or blood vessels.  What happens before the procedure? Ask your health care provider about: Changing or stopping your regular medicines. This is especially important if you are taking diabetes medicines or blood thinners. Taking medicines such as aspirin and ibuprofen. These medicines can thin your blood. Do not take these medicines before your procedure if your health care provider instructs you not to. Follow instructions from your health care provider about eating or drinking restrictions. Plan to have someone take you home after the procedure. If you go home right after the procedure, plan to have someone with you for 24 hours. What happens during the procedure? You will be given one or more of the following: A medicine to help you relax (sedative). A medicine to numb the area (local anesthetic). You will be awake during the procedure. You will need to be able to talk with the health care provider during the procedure. With the help of a type of X-ray (fluoroscopy), the health care provider will insert a radiofrequency needle into the area to be treated. Next, a wire that carries the radio waves (electrode) will be put  through  the radiofrequency needle. An electrical pulse will be sent through the electrode to verify the correct nerve. You will feel a tingling sensation, and you may have muscle twitching. Then, the tissue that is around the needle tip will be heated by an electric current that is passed using the radiofrequency machine. This will numb the nerves. A bandage (dressing) will be put on the insertion area after the procedure is done. The procedure may vary among health care providers and hospitals. What happens after the procedure? Your blood pressure, heart rate, breathing rate, and blood oxygen level will be monitored often until the medicines you were given have worn off. Return to your normal activities as directed by your health care provider. This information is not intended to replace advice given to you by your health care provider. Make sure you discuss any questions you have with your health care provider. Document Released: 06/21/2011 Document Revised: 03/30/2016 Document Reviewed: 11/30/2014 Elsevier Interactive Patient Education  2018 [place] were given 3 prescriptions for Oxycodone today.

## 2018-02-05 NOTE — Progress Notes (Signed)
Patient's Name: Brittany Mays  MRN: [id number]  Referring Provider: [name], MD  DOB: January 25, 1941  PCP: [name], MD  DOS: 02/05/2018  Note by: Vevelyn Francois NP  Service setting: Ambulatory outpatient  Specialty: Interventional Pain Management  Location: [facility] (AMB) Pain Management Facility    Patient type: Established    Primary Reason(s) for Visit: Encounter for prescription drug management. (Level of risk: moderate)  CC: Back Pain (lower, both sides)  HPI  Brittany Mays is a 77 y.o. year old, female patient, who comes today for a medication management evaluation. She has Anxiety and depression; Obesity (BMI 30-39.9); Lumbar spinal stenosis; History of lumbar fusion (T10 through L3 fusion); Generalized weakness; Mechanical complication of internal fixation device such as nail, plate or rod ([place]); Pseudarthrosis after fusion or arthrodesis; Pulmonary nodule; Chronic hip pain (Location of Secondary source of pain) (Left); Hyperlipidemia; Aortic aneurysm ([place]); Osteoarthritis, multiple sites; Chronic low back pain (Location of Primary Source of Pain) (Bilateral) (R>L); Failed back surgical syndrome (x4); Chronic knee pain (Bilateral) (L>R); Long term current use of opiate analgesic; Long term prescription opiate use; Opiate use (60 MME/Day); Encounter for pain management planning; Encounter for therapeutic drug level monitoring; Abnormal CT scan, lumbar spine (05/07/2015); Osteoarthritis of sacroiliac joint (Bilateral); Lumbar facet arthropathy ([place]); Lumbar spondylolisthesis; Lumbar foraminal stenosis (Bilateral); History of total bilateral knee replacement; Opioid-induced constipation ([facility]); Chronic groin pain (Location of Tertiary source of pain) (Left); Postlaminectomy syndrome of lumbar region; Lumbar facet syndrome (Bilateral) (R>L); Pseudarthrosis following spinal fusion; Lumbar spondylosis; Chronic upper back pain (midline); Chronic pain syndrome; Tremor; Musculoskeletal pain;  Acute postoperative pain; Osteoarthritis of hip (Left); Osteoarthritis of ankle or foot; Trochanteric bursitis of left hip; Osteoarthritis of hip  (Bilateral) (L>R); Status post total replacement of left hip; Anemia; Chronic abdominal pain; Bochdalek hernia; and S/P left inguinal hernia repair on their problem list. Her primarily concern today is the Back Pain (lower, both sides)  Pain Assessment: Location: Right, Left, Lower Back Radiating: denies Onset: More than a month ago Duration: Chronic pain Quality: Stabbing, Aching Severity: 2 /10 (self-reported pain score)  Note: Reported level is compatible with observation.                          Effect on ADL:   Timing: Intermittent Modifying factors: medication, rest  Brittany Mays was last scheduled for an appointment on 11/15/2017 for medication management. During today's appointment we reviewed Brittany Mays's chronic pain status, as well as her outpatient medication regimen. She admits that she is have more left sided lower back  pain. She also complains of right sided middle back pain. She states that the injections do not seem to help with this area. She denies any radiating pain. She admits that she continues to follow up for her abdominal pain and GI issues. She denies any constipation.   The patient  reports that she does not use drugs. Her body mass index is 22.46 kg/m.  Further details on both, my assessment(s), as well as the proposed treatment plan, please see below.  Controlled Substance Pharmacotherapy Assessment REMS (Risk Evaluation and Mitigation Strategy)  Analgesic:Oxycodone IR 10 mg every 6 hours (40 mg/dayof oxycodone) MME/day:70m/day   WLandis Martins RN  02/05/2018 12:45 PM  Sign at close encounter Nursing Pain Medication Assessment:  Safety precautions to be maintained throughout the outpatient stay will include: orient to surroundings, keep bed in low position, maintain call bell within reach at all  times,  provide assistance with transfer out of bed and ambulation.  Medication Inspection Compliance: Pill count conducted under aseptic conditions, in front of the patient. Neither the pills nor the bottle was removed from the patient's sight at any time. Once count was completed pills were immediately returned to the patient in their original bottle.  Medication: Oxycodone IR Pill/Patch Count: 10 of 120 pills remain Pill/Patch Appearance: Markings consistent with prescribed medication Bottle Appearance: Standard pharmacy container. Clearly labeled. Filled Date: 02/28/ 2019 Last Medication intake:  Today   Pharmacokinetics: Liberation and absorption (onset of action): WNL Distribution (time to peak effect): WNL Metabolism and excretion (duration of action): WNL         Pharmacodynamics: Desired effects: Analgesia: Brittany Mays reports >50% benefit. Functional ability: Patient reports that medication allows her to accomplish basic ADLs Clinically meaningful improvement in function (CMIF): Sustained CMIF goals met Perceived effectiveness: Described as relatively effective, allowing for increase in activities of daily living (ADL) Undesirable effects: Side-effects or Adverse reactions: None reported Monitoring: [place] PMP: Online review of the past 22-monthperiod conducted. Compliant with practice rules and regulations Last UDS on record: Summary  Date Value Ref Range Status  04/24/2017 FINAL  Final    Comment:    ==================================================================== TOXASSURE SELECT 13 (MW) ==================================================================== Test                             Result       Flag       Units Drug Present and Declared for Prescription Verification   Oxycodone                      1709         EXPECTED   ng/mg creat   Oxymorphone                    2393         EXPECTED   ng/mg creat   Noroxycodone                   8742         EXPECTED   ng/mg  creat   Noroxymorphone                 1387         EXPECTED   ng/mg creat    Sources of oxycodone are scheduled prescription medications.    Oxymorphone, noroxycodone, and noroxymorphone are expected    metabolites of oxycodone. Oxymorphone is also available as a    scheduled prescription medication.   Phenobarbital                  PRESENT      EXPECTED    Phenobarbital is an expected metabolite of primidone;    Phenobarbital may also be administered as a prescription drug. Drug Absent but Declared for Prescription Verification   Hydrocodone                    Not Detected UNEXPECTED ng/mg creat ==================================================================== Test                      Result    Flag   Units      Ref Range   Creatinine              67  mg/dL      >=20 ==================================================================== Declared Medications:  The flagging and interpretation on this report are based on the  following declared medications.  Unexpected results may arise from  inaccuracies in the declared medications.  **Note: The testing scope of this panel includes these medications:  Hydrocodone (Hydrocodone-Acetaminophen)  Oxycodone  Primidone  **Note: The testing scope of this panel does not include following  reported medications:  Acetaminophen (Hydrocodone-Acetaminophen)  Cyanocobalamin  Cyclobenzaprine  Duloxetine  Gabapentin  Lubiprostone  Melatonin  Multivitamin (MVI)  Naloxone  Omega-3 Fatty Acids  Polyethylene Glycol  Supplement  Supplement (Red Yeast Rice)  Vitamin C ==================================================================== For clinical consultation, please call [phone]. ====================================================================    UDS interpretation: Compliant          Medication Assessment Form: Reviewed. Patient indicates being compliant with therapy Treatment compliance: Compliant Risk  Assessment Profile: Aberrant behavior: See prior evaluations. None observed or detected today Comorbid factors increasing risk of overdose: See prior notes. No additional risks detected today Risk of substance use disorder (SUD): Low  ORT Scoring interpretation table:  Score <3 = Low Risk for SUD  Score between 4-7 = Moderate Risk for SUD  Score >8 = High Risk for Opioid Abuse   Risk Mitigation Strategies:  Patient Counseling: Covered Patient-Prescriber Agreement (PPA): Present and active  Notification to other healthcare providers: Done  Pharmacologic Plan: No change in therapy, at this time.             Laboratory Chemistry  Inflammation Markers (CRP: Acute Phase) (ESR: Chronic Phase) Lab Results  Component Value Date   CRP 0.6 07/12/2016   ESRSEDRATE 6 07/12/2016                         Rheumatology Markers No results found for: Elayne Guerin, [facility]                      Renal Function Markers Lab Results  Component Value Date   BUN 14 11/27/2017   CREATININE 0.71 11/27/2017   GFRAA >60 11/27/2017   GFRNONAA >60 11/27/2017                              Hepatic Function Markers Lab Results  Component Value Date   AST 20 11/08/2017   ALT 11 11/08/2017   ALBUMIN 4.2 11/08/2017   ALKPHOS 81 11/08/2017   HCVAB NEGATIVE 09/07/2015   LIPASE 36 06/25/2017                        Electrolytes Lab Results  Component Value Date   NA 139 11/27/2017   K 3.9 11/27/2017   CL 102 11/27/2017   CALCIUM 9.3 11/27/2017   MG 2.0 07/12/2016                        Neuropathy Markers Lab Results  Component Value Date   VITAMINB12 498 10/01/2017   FOLATE 20.6 10/01/2017   HGBA1C 5.9 (H) 07/27/2015   HIV NONREACTIVE 09/07/2015                        Bone Pathology Markers Lab Results  Component Value Date   25OHVITD1 35 07/12/2016   25OHVITD2 <1.0 07/12/2016   25OHVITD3 35 07/12/2016  Coagulation Parameters Lab  Results  Component Value Date   PLT 203 11/27/2017                        Cardiovascular Markers Lab Results  Component Value Date   TROPONINI <0.03 06/25/2017   HGB 13.5 11/27/2017   HCT 40.9 11/27/2017                         CA Markers No results found for: CEA, CA125, LABCA2                      Note: Lab results reviewed.  Recent Diagnostic Imaging Results  XR HIP UNILAT W OR W/O PELVIS 1V LEFT An AP pelvis and lateral of the left hip shows a well-seated total hip  arthroplasty with no complicating features or acute findings.  Complexity Note: Imaging results reviewed. Results shared with Ms. Votaw, using Layman's terms.                         Meds   Current Outpatient Medications:  .  cyclobenzaprine (FLEXERIL) 5 MG tablet, TAKE 1 TABLET BY MOUTH THREE TIMES DAY AS NEEDED FOR MUSCLE SPASMS, Disp: , Rfl: 0 .  dicyclomine (BENTYL) 20 MG tablet, Take 20 mg by mouth every 6 (six) hours as needed for spasms. , Disp: , Rfl:  .  docusate sodium (COLACE) 100 MG capsule, Take 200 mg by mouth daily., Disp: , Rfl:  .  DULoxetine (CYMBALTA) 60 MG capsule, TAKE 1 CAPSULE EVERY DAY, Disp: 90 capsule, Rfl: 3 .  Melatonin 5 MG CAPS, Take 5 mg by mouth at bedtime., Disp: , Rfl:  .  Multiple Vitamin (MULTIVITAMIN) tablet, Take 1 tablet by mouth daily., Disp: , Rfl:  .  Omega-3 Fatty Acids (OMEGA 3 500 PO), Take 500 mg by mouth daily., Disp: , Rfl:  .  oxyCODONE (OXY IR/ROXICODONE) 5 MG immediate release tablet, Take 1-2 tablets (5-10 mg total) by mouth every 6 (six) hours as needed for moderate pain, severe pain or breakthrough pain., Disp: 30 tablet, Rfl: 0 .  polyethylene glycol (MIRALAX / GLYCOLAX) packet, Take 17 g by mouth daily. Mix one tablespoon with 8oz of your favorite juice or water every day until you are having soft formed stools. Then start taking once daily if you didn't have a stool the day before. (Patient taking differently: Take 17 g by mouth daily as needed for mild  constipation. Mix one tablespoon with 8oz of your favorite juice or water every day until you are having soft formed stools. Then start taking once daily if you didn't have a stool the day before.), Disp: 30 each, Rfl: 0 .  primidone (MYSOLINE) 250 MG tablet, Take 25 mg by mouth at bedtime. , Disp: , Rfl:  .  Probiotic Product (ALIGN EXTRA STRENGTH PO), Take 1 capsule by mouth daily. , Disp: , Rfl:  .  rosuvastatin (CRESTOR) 20 MG tablet, Take 1 tablet (20 mg total) by mouth daily., Disp: 90 tablet, Rfl: 3 .  [START ON 04/06/2018] Oxycodone HCl 10 MG TABS, Take 1 tablet (10 mg total) by mouth every 6 (six) hours as needed., Disp: 120 tablet, Rfl: 0 .  [START ON 03/07/2018] Oxycodone HCl 10 MG TABS, Take 1 tablet (10 mg total) by mouth every 6 (six) hours as needed., Disp: 120 tablet, Rfl: 0 .  Oxycodone HCl 10 MG TABS,  Take 1 tablet (10 mg total) by mouth every 6 (six) hours as needed., Disp: 120 tablet, Rfl: 0  ROS  Constitutional: Denies any fever or chills Gastrointestinal: No reported hemesis, hematochezia, vomiting, or acute GI distress Musculoskeletal: Denies any acute onset joint swelling, redness, loss of ROM, or weakness Neurological: No reported episodes of acute onset apraxia, aphasia, dysarthria, agnosia, amnesia, paralysis, loss of coordination, or loss of consciousness  Allergies  Ms. Nancarrow is allergic to fentanyl; nsaids; and pentothal [thiopental].  PFSH  Drug: Ms. Gielow  reports that she does not use drugs. Alcohol:  reports that she does not drink alcohol. Tobacco:  reports that she has been smoking cigarettes.  She has a 25.00 pack-year smoking history. She has never used smokeless tobacco. Medical:  has a past medical history of Adverse effect of anesthetic, Anxiety, Aortic aneurysm ([place]) (06/06/2016), Cataract, Chest pain (29/52/8413), Complication of anesthesia, Constipation due to opioid therapy, Decreased muscle strength (02/10/2014), Depression, Difficulty in walking(719.7)  (02/10/2014), Dysphagia (08/19/2015), Dyspnea (08/19/2015), GERD (gastroesophageal reflux disease), History of kidney stones, Hyperparathyroidism, primary ([place]), Hyperventilation (08/18/2015), Insomnia, Intractable back pain (04/29/2014), Kidney stone, Lumbar spinal stenosis, Odynophagia (08/18/2015), Osteoarthritis of both knees, Pain, Rectal prolapse, Unintentional weight loss (09/08/2015), and UTI (lower urinary tract infection) (04/28/2014). Surgical: Ms. Bebee  has a past surgical history that includes Abdominal hysterectomy; Spine surgery; Parathyroidectomy (1990's); Appendectomy; Breast lumpectomy; Tonsillectomy; Back surgery (07-2015); Total hip arthroplasty (Left, 09/18/2017); Total knee arthroplasty (Bilateral, 1990's nd 2002); Colonoscopy (2013); Total hip arthroplasty (Left, 09/18/2017); Joint replacement; Rotator cuff repair; and Inguinal hernia repair (Left, 12/03/2017). Family: family history includes COPD in her father; Diabetes in her son; Heart failure in her father; Mental illness in her mother.  Constitutional Exam  General appearance: Well nourished, well developed, and well hydrated. In no apparent acute distress Vitals:   02/05/18 1240  BP: 104/71  Pulse: 81  Resp: 16  Temp: 98.3 F (36.8 C)  TempSrc: Oral  SpO2: 100%  Weight: 115 lb (52.2 kg)  Height: 5' (1.524 m)   BMI Assessment: Estimated body mass index is 22.46 kg/m as calculated from the following:   Height as of this encounter: 5' (1.524 m).   Weight as of this encounter: 115 lb (52.2 kg). Psych/Mental status: Alert, oriented x 3 (person, place, & time)       Eyes: PERLA Respiratory: No evidence of acute respiratory distress   Thoracic Spine Area Exam  Skin & Axial Inspection: No masses, redness, or swelling Alignment: Symmetrical Functional ROM: Unrestricted ROM Stability: No instability detected Muscle Tone/Strength: Functionally intact. No obvious neuro-muscular anomalies detected. Sensory (Neurological):  Unimpaired Muscle strength & Tone: Complains of area being tender to palpation  Lumbar Spine Area Exam  Skin & Axial Inspection: Well healed scar from previous spine surgery detected Alignment: Symmetrical Functional ROM: Unrestricted ROM      Stability: No instability detected Muscle Tone/Strength: Functionally intact. No obvious neuro-muscular anomalies detected. Sensory (Neurological): Unimpaired Palpation: Complains of area being tender to palpation       Provocative Tests: Lumbar Hyperextension and rotation test: evaluation deferred today       Lumbar Lateral bending test: evaluation deferred today       Patrick's Maneuver: evaluation deferred today                    Gait & Posture Assessment  Ambulation: Unassisted Gait: Relatively normal for age and body habitus Posture: WNL   Lower Extremity Exam    Side: Right lower extremity  Side: Left lower extremity  Skin & Extremity Inspection: Skin color, temperature, and hair growth are WNL. No peripheral edema or cyanosis. No masses, redness, swelling, asymmetry, or associated skin lesions. No contractures.  Skin & Extremity Inspection: Skin color, temperature, and hair growth are WNL. No peripheral edema or cyanosis. No masses, redness, swelling, asymmetry, or associated skin lesions. No contractures.  Functional ROM: Unrestricted ROM          Functional ROM: Unrestricted ROM          Muscle Tone/Strength: Functionally intact. No obvious neuro-muscular anomalies detected.  Muscle Tone/Strength: Functionally intact. No obvious neuro-muscular anomalies detected.  Sensory (Neurological): Unimpaired  Sensory (Neurological): Unimpaired  Palpation: No palpable anomalies  Palpation: No palpable anomalies   Assessment  Primary Diagnosis & Pertinent Problem List: The primary encounter diagnosis was Lumbar spondylosis. Diagnoses of Chronic hip pain (Location of Secondary source of pain) (Left), Chronic upper back pain (midline), Chronic  abdominal pain, Chronic pain syndrome, and Long term prescription opiate use were also pertinent to this visit.  Status Diagnosis  Recurring Stable Persistent 1. Lumbar spondylosis   2. Chronic hip pain (Location of Secondary source of pain) (Left)   3. Chronic upper back pain (midline)   4. Chronic abdominal pain   5. Chronic pain syndrome   6. Long term prescription opiate use     Problems updated and reviewed during this visit: No problems updated. Plan of Care  Pharmacotherapy (Medications Ordered): Meds ordered this encounter  Medications  . Oxycodone HCl 10 MG TABS    Sig: Take 1 tablet (10 mg total) by mouth every 6 (six) hours as needed.    Dispense:  120 tablet    Refill:  0    Fill one day early if pharmacy is closed on scheduled refill date. Do not fill until: 04/06/2018 To last until: 05/06/2018    Order Specific Question:   Supervising Provider    Answer:   Milinda Pointer [phone]  . Oxycodone HCl 10 MG TABS    Sig: Take 1 tablet (10 mg total) by mouth every 6 (six) hours as needed.    Dispense:  120 tablet    Refill:  0    Fill one day early if pharmacy is closed on scheduled refill date. Do not fill until: 03/07/2018 To last until: 04/06/2018    Order Specific Question:   Supervising Provider    Answer:   Milinda Pointer [phone]  . Oxycodone HCl 10 MG TABS    Sig: Take 1 tablet (10 mg total) by mouth every 6 (six) hours as needed.    Dispense:  120 tablet    Refill:  0    DO NOT DELETE, even if Expired!!! See Dr. Adalberto Cole care coordination note.Fill one day early if pharmacy is closed on scheduled refill date. Do not fill until: 02/05/2018 To last until: 03/07/2018    Order Specific Question:   Supervising Provider    Answer:   Milinda Pointer [phone]   New Prescriptions   No medications on file   Medications administered today: Haydon L. Zinni had no medications administered during this visit. Lab-work, procedure(s), and/or referral(s): Orders Placed  This Encounter  Procedures  . Radiofrequency,Lumbar  . ToxASSURE Select 13 (MW), Urine   Imaging and/or referral(s): None  Interventional therapies: Planned, scheduled, and/or pending: Left lumbar facet radiofrequency ablation   Considering: Diagnostic bilateral lumbar facet block #2under fluoro and sedation. Diagnostic bilateral thoracolumbar facet block. Possible bilateral thoracolumbar facet radiofrequencyablation.  Diagnostic left  intra-articular hip joint injection. Diagnostic left femoral and obturator nerve articular branch blocks.  Possible left femoral and obturator nerve articular branch radiofrequencyablation.  Diagnostic bilateral genicular nerve blocks.  Possible bilateral genicular nerve radiofrequencyablation.  Diagnosticintrathecal pump trial.   Palliative PRN treatment(s): Palliative bilateral lumbar facet block      Provider-requested follow-up: Return in about 3 months (around 05/07/2018) for MedMgmt with Me Dionisio David), in addition, Procedure(w/Sedation), w/ Dr. Dossie Arbour, Left Lumbar RFA.  Future Appointments  Date Time Provider [place]  02/07/2018  1:00 PM Jerl Mina, PT [facility] None  02/13/2018 12:30 PM Standley Brooking, LCSW [facility] None  02/21/2018 12:00 PM Jerl Mina, PT [facility] None  02/27/2018  1:00 PM Jerl Mina, PT [facility] None  03/05/2018 11:00 AM Jon Billings, RN [facility] None  05/07/2018 12:45 PM Vevelyn Francois, NP [facility] None  05/08/2018  2:45 PM [name], MD [facility]  07/22/2018  3:00 PM Mcarthur Rossetti, MD [facility] None   Primary Care Physician: [name], MD Location: [facility] Note by: Vevelyn Francois NP Date: 02/05/2018; Time: 3:11 PM  Pain Score Disclaimer: We use the NRS-11 scale. This is a self-reported, subjective measurement of pain severity with only modest accuracy. It is used primarily to identify changes within a  particular patient. It must be understood that outpatient pain scales are significantly less accurate that those used for research, where they can be applied under ideal controlled circumstances with minimal exposure to variables. In reality, the score is likely to be a combination of pain intensity and pain affect, where pain affect describes the degree of emotional arousal or changes in action readiness caused by the sensory experience of pain. Factors such as social and work situation, setting, emotional state, anxiety levels, expectation, and prior pain experience may influence pain perception and show large inter-individual differences that may also be affected by time variables.  Patient instructions provided during this appointment: Patient Instructions  ____________________________________________________________________________________________  Medication Rules  Applies to: All patients receiving prescriptions (written or electronic).  Pharmacy of record: Pharmacy where electronic prescriptions will be sent. If written prescriptions are taken to a different pharmacy, please inform the nursing staff. The pharmacy listed in the electronic medical record should be the one where you would like electronic prescriptions to be sent.  Prescription refills: Only during scheduled appointments. Applies to both, written and electronic prescriptions.  NOTE: The following applies primarily to controlled substances (Opioid* Pain Medications).   Patient's responsibilities: 1. Pain Pills: Bring all pain pills to every appointment (except for procedure appointments). 2. Pill Bottles: Bring pills in original pharmacy bottle. Always bring newest bottle. Bring bottle, even if empty. 3. Medication refills: You are responsible for knowing and keeping track of what medications you need refilled. The day before your appointment, write a list of all prescriptions that need to be refilled. Bring that list to your  appointment and give it to the admitting nurse. Prescriptions will be written only during appointments. If you forget a medication, it will not be "Called in", "Faxed", or "electronically sent". You will need to get another appointment to get these prescribed. 4. Prescription Accuracy: You are responsible for carefully inspecting your prescriptions before leaving our office. Have the discharge nurse carefully go over each prescription with you, before taking them home. Make sure that your name is accurately spelled, that your address is correct. Check the name and dose of your medication to make sure it is accurate. Check the  number of pills, and the written instructions to make sure they are clear and accurate. Make sure that you are given enough medication to last until your next medication refill appointment. 5. Taking Medication: Take medication as prescribed. Never take more pills than instructed. Never take medication more frequently than prescribed. Taking less pills or less frequently is permitted and encouraged, when it comes to controlled substances (written prescriptions).  6. Inform other Doctors: Always inform, all of your healthcare providers, of all the medications you take. 7. Pain Medication from other Providers: You are not allowed to accept any additional pain medication from any other Doctor or Healthcare provider. There are two exceptions to this rule. (see below) In the event that you require additional pain medication, you are responsible for notifying us, as stated below. 8. Medication Agreement: You are responsible for carefully reading and following our Medication Agreement. This must be signed before receiving any prescriptions from our practice. Safely store a copy of your signed Agreement. Violations to the Agreement will result in no further prescriptions. (Additional copies of our Medication Agreement are available upon request.) 9. Laws, Rules, & Regulations: All patients are  expected to follow all Federal and [employer], [employer], Rules, [employer]. Ignorance of the Laws does not constitute a valid excuse. The use of any illegal substances is prohibited. 10. Adopted CDC guidelines & recommendations: Target dosing levels will be at or below 60 MME/day. Use of benzodiazepines** is not recommended.  Exceptions: There are only two exceptions to the rule of not receiving pain medications from other Healthcare Providers. 1. Exception #1 (Emergencies): In the event of an emergency (i.e.: accident requiring emergency care), you are allowed to receive additional pain medication. However, you are responsible for: As soon as you are able, call our office [phone], at any time of the day or night, and leave a message stating your name, the date and nature of the emergency, and the name and dose of the medication prescribed. In the event that your call is answered by a member of our staff, make sure to document and save the date, time, and the name of the person that took your information.  2. Exception #2 (Planned Surgery): In the event that you are scheduled by another doctor or dentist to have any type of surgery or procedure, you are allowed (for a period no longer than 30 days), to receive additional pain medication, for the acute post-op pain. However, in this case, you are responsible for picking up a copy of our "Post-op Pain Management for Surgeons" handout, and giving it to your surgeon or dentist. This document is available at our office, and does not require an appointment to obtain it. Simply go to our office during business hours (Monday-Thursday from 8:00 AM to 4:00 PM) (Friday 8:00 AM to 12:00 Noon) or if you have a scheduled appointment with [place], prior to your surgery, and ask for it by name. In addition, you will need to provide [place] with your name, name of your surgeon, type of surgery, and date of procedure or surgery.  *Opioid medications include: morphine, codeine,  oxycodone, oxymorphone, hydrocodone, hydromorphone, meperidine, tramadol, tapentadol, buprenorphine, fentanyl, methadone. **Benzodiazepine medications include: diazepam (Valium), alprazolam (Xanax), clonazepam (Klonopine), lorazepam (Ativan), clorazepate (Tranxene), chlordiazepoxide (Librium), estazolam (Prosom), oxazepam (Serax), temazepam (Restoril), triazolam (Halcion) (Last updated: 01/03/2018) ____________________________________________________________________________________________   Radiofrequency Lesioning Radiofrequency lesioning is a procedure that is performed to relieve pain. The procedure is often used for back, neck, or arm pain. Radiofrequency lesioning  involves the use of a machine that creates radio waves to make heat. During the procedure, the heat is applied to the nerve that carries the pain signal. The heat damages the nerve and interferes with the pain signal. Pain relief usually starts about 2 weeks after the procedure and lasts for 6 months to 1 year. Tell a health care provider about: Any allergies you have. All medicines you are taking, including vitamins, herbs, eye drops, creams, and over-the-counter medicines. Any problems you or family members have had with anesthetic medicines. Any blood disorders you have. Any surgeries you have had. Any medical conditions you have. Whether you are pregnant or may be pregnant. What are the risks? Generally, this is a safe procedure. However, problems may occur, including: Pain or soreness at the injection site. Infection at the injection site. Damage to nerves or blood vessels.  What happens before the procedure? Ask your health care provider about: Changing or stopping your regular medicines. This is especially important if you are taking diabetes medicines or blood thinners. Taking medicines such as aspirin and ibuprofen. These medicines can thin your blood. Do not take these medicines before your procedure if your health  care provider instructs you not to. Follow instructions from your health care provider about eating or drinking restrictions. Plan to have someone take you home after the procedure. If you go home right after the procedure, plan to have someone with you for 24 hours. What happens during the procedure? You will be given one or more of the following: A medicine to help you relax (sedative). A medicine to numb the area (local anesthetic). You will be awake during the procedure. You will need to be able to talk with the health care provider during the procedure. With the help of a type of X-ray (fluoroscopy), the health care provider will insert a radiofrequency needle into the area to be treated. Next, a wire that carries the radio waves (electrode) will be put through the radiofrequency needle. An electrical pulse will be sent through the electrode to verify the correct nerve. You will feel a tingling sensation, and you may have muscle twitching. Then, the tissue that is around the needle tip will be heated by an electric current that is passed using the radiofrequency machine. This will numb the nerves. A bandage (dressing) will be put on the insertion area after the procedure is done. The procedure may vary among health care providers and hospitals. What happens after the procedure? Your blood pressure, heart rate, breathing rate, and blood oxygen level will be monitored often until the medicines you were given have worn off. Return to your normal activities as directed by your health care provider. This information is not intended to replace advice given to you by your health care provider. Make sure you discuss any questions you have with your health care provider. Document Released: 06/21/2011 Document Revised: 03/30/2016 Document Reviewed: 11/30/2014 Elsevier Interactive Patient Education  2018 [place] were given 3 prescriptions for Oxycodone today.

## 2018-02-07 ENCOUNTER — Ambulatory Visit: Payer: Medicare HMO | Admitting: Physical Therapy

## 2018-02-07 ENCOUNTER — Other Ambulatory Visit: Payer: Self-pay | Admitting: Unknown Physician Specialty

## 2018-02-07 DIAGNOSIS — R1084 Generalized abdominal pain: Secondary | ICD-10-CM

## 2018-02-12 LAB — TOXASSURE SELECT 13 (MW), URINE

## 2018-02-13 ENCOUNTER — Other Ambulatory Visit: Payer: Self-pay | Admitting: *Deleted

## 2018-02-13 NOTE — Patient Outreach (Signed)
[facility]) Care Management  02/13/2018  Brittany Mays May 29, 1941 [id number]   CSW called & spoke with patient to follow-up on independent living facilities - patient was hopeful for [facility], but patient informed CSW that when they went to tour the facility by the time the room and board, meals and everything else added up it was over their limit. CSW provided patient with contact information for [facility] in [place] and encouraged her to call. Patient states that they would prefer two-bedrooms if possible but are open to the idea of a one-bedroom. Patient states that in the meantime, they have been going through their belongings to see what they could downsize to.   CSW will check back with patient in 2 weeks to ensure that they were able to get in contact with [facility].   Raynaldo Opitz, LCSW [facility]  Clinical Social Worker cell #: [phone]

## 2018-02-21 ENCOUNTER — Ambulatory Visit: Payer: Medicare HMO | Admitting: Physical Therapy

## 2018-02-25 ENCOUNTER — Ambulatory Visit (HOSPITAL_COMMUNITY)
Admission: RE | Admit: 2018-02-25 | Discharge: 2018-02-25 | Disposition: A | Discharge status: Home / Self Care | Payer: Medicare HMO | Source: Ambulatory Visit | Attending: Unknown Physician Specialty | Admitting: Unknown Physician Specialty

## 2018-02-25 DIAGNOSIS — I700 Atherosclerosis of aorta: Secondary | ICD-10-CM | POA: Diagnosis not present

## 2018-02-25 DIAGNOSIS — K5900 Constipation, unspecified: Secondary | ICD-10-CM | POA: Diagnosis not present

## 2018-02-25 DIAGNOSIS — K5730 Diverticulosis of large intestine without perforation or abscess without bleeding: Secondary | ICD-10-CM | POA: Diagnosis not present

## 2018-02-25 DIAGNOSIS — R1084 Generalized abdominal pain: Secondary | ICD-10-CM | POA: Diagnosis not present

## 2018-02-25 DIAGNOSIS — N8189 Other female genital prolapse: Secondary | ICD-10-CM | POA: Insufficient documentation

## 2018-02-25 DIAGNOSIS — R933 Abnormal findings on diagnostic imaging of other parts of digestive tract: Secondary | ICD-10-CM | POA: Insufficient documentation

## 2018-02-25 LAB — POCT I-STAT CREATININE: Creatinine, Ser: 1.1 mg/dL — ABNORMAL HIGH (ref 0.44–1.00)

## 2018-02-25 MED ORDER — IOPAMIDOL (ISOVUE-300) INJECTION 61%
100.0000 mL | Freq: Once | INTRAVENOUS | Status: AC | PRN
  Administered 2018-02-25: 100 mL via INTRAVENOUS

## 2018-02-27 ENCOUNTER — Ambulatory Visit: Payer: Medicare HMO | Admitting: Physical Therapy

## 2018-02-27 ENCOUNTER — Other Ambulatory Visit: Payer: Self-pay | Admitting: *Deleted

## 2018-02-28 NOTE — Patient Outreach (Signed)
[facility]) Care Management  02/28/2018  Brittany Mays 1941/01/07 [id number]   CSW called & spoke with patient to follow-up on housing options. Patient reports that she has been playing phone tag with [facility] but is still interesting in finding out more information on them. CSW also provided patient with information on [facility] in [place] and encouraged patient to call there as their website notes that they have 2 bedroom apartments which patient states she was interested in. Patient reports that they have been trying to fix up their house to put on the market once they have made arrangements to move into an independent living.   CSW will check back with patient in 2 weeks.    Raynaldo Opitz, LCSW [facility]  Clinical Social Worker cell #: [phone]

## 2018-03-05 ENCOUNTER — Other Ambulatory Visit: Payer: Self-pay

## 2018-03-05 NOTE — Patient Outreach (Signed)
[place] [facility]) Care Management  [place]  03/05/2018   Brittany Mays October 05, 1941 [id number]  Subjective: Telephone call to patient for follow up call. Patient able to verify HIPAA.  Patient reports that she is tired from her stomach issues and no one seems to be able to find anything. Patient states she had a CT scan done and nothing found. Patient has appointment with [facility] specialist in June.  Patient to have colonoscopy done on next week.  Patient reports that she continues her bowel regimen but has some constipation.  Reiterated the importance of continuing bowel regimen.  Patient offers no concerns presently.    Objective:   Encounter Medications:  Outpatient Encounter Medications as of 03/05/2018  Medication Sig Note  . cyclobenzaprine (FLEXERIL) 5 MG tablet TAKE 1 TABLET BY MOUTH THREE TIMES DAY AS NEEDED FOR MUSCLE SPASMS   . dicyclomine (BENTYL) 20 MG tablet Take 20 mg by mouth every 6 (six) hours as needed for spasms.  01/22/2018: Patient taking TID  . docusate sodium (COLACE) 100 MG capsule Take 200 mg by mouth daily. 12/07/2017: Taking 100 mg BID  . DULoxetine (CYMBALTA) 60 MG capsule TAKE 1 CAPSULE EVERY DAY   . Melatonin 5 MG CAPS Take 5 mg by mouth at bedtime.   . Multiple Vitamin (MULTIVITAMIN) tablet Take 1 tablet by mouth daily.   . Omega-3 Fatty Acids (OMEGA 3 500 PO) Take 500 mg by mouth daily.   Marland Kitchen oxyCODONE (OXY IR/ROXICODONE) 5 MG immediate release tablet Take 1-2 tablets (5-10 mg total) by mouth every 6 (six) hours as needed for moderate pain, severe pain or breakthrough pain.   Derrill Memo ON 04/06/2018] Oxycodone HCl 10 MG TABS Take 1 tablet (10 mg total) by mouth every 6 (six) hours as needed.   Derrill Memo ON 03/07/2018] Oxycodone HCl 10 MG TABS Take 1 tablet (10 mg total) by mouth every 6 (six) hours as needed.   . Oxycodone HCl 10 MG TABS Take 1 tablet (10 mg total) by mouth every 6 (six) hours as needed.   . polyethylene glycol (MIRALAX / GLYCOLAX)  packet Take 17 g by mouth daily. Mix one tablespoon with 8oz of your favorite juice or water every day until you are having soft formed stools. Then start taking once daily if you didn't have a stool the day before. (Patient taking differently: Take 17 g by mouth daily as needed for mild constipation. Mix one tablespoon with 8oz of your favorite juice or water every day until you are having soft formed stools. Then start taking once daily if you didn't have a stool the day before.)   . primidone (MYSOLINE) 250 MG tablet Take 25 mg by mouth at bedtime.  12/07/2017: Taking 25 mg Daily  . Probiotic Product (ALIGN EXTRA STRENGTH PO) Take 1 capsule by mouth daily.    . rosuvastatin (CRESTOR) 20 MG tablet Take 1 tablet (20 mg total) by mouth daily.    No facility-administered encounter medications on file as of 03/05/2018.     Functional Status:  In your present state of health, do you have any difficulty performing the following activities: 12/14/2017 12/03/2017  Hearing? N N  Vision? N N  Difficulty concentrating or making decisions? N N  Walking or climbing stairs? N N  Dressing or bathing? N N  Doing errands, shopping? N N  Preparing Food and eating ? N -  Using the Toilet? N -  In the past six months, have you accidently  leaked urine? N -  Do you have problems with loss of bowel control? N -  Managing your Medications? N -  Managing your Finances? N -  Housekeeping or managing your Housekeeping? N -  Some recent data might be hidden    Fall/Depression Screening: Fall Risk  02/05/2018 12/14/2017 11/15/2017  Falls in the past year? No No No  Number falls in past yr: - - -  Injury with Fall? - - -   PHQ 2/9 Scores 02/05/2018 12/14/2017 11/15/2017 10/08/2017 08/09/2017 05/29/2017 04/24/2017  PHQ - 2 Score 0 1 0 0 0 0 0  PHQ- 9 Score - - - - - - -  Exception Documentation - - - - - - -    Assessment: Patient continues to benefit from care manager outreach for disease management and support.   Plan:   [facility] CM Care Plan Problem One     Most Recent Value  Care Plan Problem One  Recent Hernia Repair  Role Documenting the Problem One  Care Management Telephonic Coordinator  Care Plan for Problem One  Active  [facility] CM Short Term Goal #2   Patient will report less episodes of constipation within 30 days.  [facility] CM Short Term Goal #2 Start Date  03/05/18  Interventions for Short Term Goal #2  RN CM discussed importance of bowel regimen.  Patient continues to take bentyl, miralax, and colace.        RN CM will contact patient in the month of May and patient agrees to next outreach.  [name], RN, MSN [facility] Care Management Care Management Coordinator Direct Line [phone] Toll Free: [phone]  Fax: [phone]

## 2018-03-07 ENCOUNTER — Ambulatory Visit (HOSPITAL_COMMUNITY): Payer: Medicare HMO

## 2018-03-08 ENCOUNTER — Encounter: Payer: Self-pay | Admitting: *Deleted

## 2018-03-11 ENCOUNTER — Encounter
Admission: RE | Disposition: A | Discharge status: Home / Self Care | Payer: Self-pay | Source: Ambulatory Visit | Attending: Unknown Physician Specialty

## 2018-03-11 ENCOUNTER — Ambulatory Visit: Payer: Medicare HMO | Admitting: Anesthesiology

## 2018-03-11 ENCOUNTER — Encounter: Payer: Self-pay | Admitting: Anesthesiology

## 2018-03-11 ENCOUNTER — Ambulatory Visit
Admission: RE | Admit: 2018-03-11 | Discharge: 2018-03-11 | Disposition: A | Discharge status: Home / Self Care | Payer: Medicare HMO | Source: Ambulatory Visit | Attending: Unknown Physician Specialty | Admitting: Unknown Physician Specialty

## 2018-03-11 DIAGNOSIS — K640 First degree hemorrhoids: Secondary | ICD-10-CM | POA: Insufficient documentation

## 2018-03-11 DIAGNOSIS — E890 Postprocedural hypothyroidism: Secondary | ICD-10-CM | POA: Insufficient documentation

## 2018-03-11 DIAGNOSIS — K5790 Diverticulosis of intestine, part unspecified, without perforation or abscess without bleeding: Secondary | ICD-10-CM | POA: Diagnosis not present

## 2018-03-11 DIAGNOSIS — Z1211 Encounter for screening for malignant neoplasm of colon: Secondary | ICD-10-CM | POA: Insufficient documentation

## 2018-03-11 DIAGNOSIS — K5730 Diverticulosis of large intestine without perforation or abscess without bleeding: Secondary | ICD-10-CM | POA: Diagnosis not present

## 2018-03-11 DIAGNOSIS — Z79891 Long term (current) use of opiate analgesic: Secondary | ICD-10-CM | POA: Diagnosis not present

## 2018-03-11 DIAGNOSIS — F329 Major depressive disorder, single episode, unspecified: Secondary | ICD-10-CM | POA: Insufficient documentation

## 2018-03-11 DIAGNOSIS — D123 Benign neoplasm of transverse colon: Secondary | ICD-10-CM | POA: Diagnosis not present

## 2018-03-11 DIAGNOSIS — Z79899 Other long term (current) drug therapy: Secondary | ICD-10-CM | POA: Diagnosis not present

## 2018-03-11 DIAGNOSIS — J449 Chronic obstructive pulmonary disease, unspecified: Secondary | ICD-10-CM | POA: Diagnosis not present

## 2018-03-11 DIAGNOSIS — F419 Anxiety disorder, unspecified: Secondary | ICD-10-CM | POA: Diagnosis not present

## 2018-03-11 DIAGNOSIS — I739 Peripheral vascular disease, unspecified: Secondary | ICD-10-CM | POA: Diagnosis not present

## 2018-03-11 DIAGNOSIS — Z96642 Presence of left artificial hip joint: Secondary | ICD-10-CM | POA: Insufficient documentation

## 2018-03-11 DIAGNOSIS — K219 Gastro-esophageal reflux disease without esophagitis: Secondary | ICD-10-CM | POA: Diagnosis not present

## 2018-03-11 DIAGNOSIS — Z86010 Personal history of colonic polyps: Secondary | ICD-10-CM | POA: Insufficient documentation

## 2018-03-11 DIAGNOSIS — Z96653 Presence of artificial knee joint, bilateral: Secondary | ICD-10-CM | POA: Diagnosis not present

## 2018-03-11 DIAGNOSIS — M1990 Unspecified osteoarthritis, unspecified site: Secondary | ICD-10-CM | POA: Diagnosis not present

## 2018-03-11 DIAGNOSIS — K635 Polyp of colon: Secondary | ICD-10-CM | POA: Diagnosis not present

## 2018-03-11 DIAGNOSIS — D126 Benign neoplasm of colon, unspecified: Secondary | ICD-10-CM | POA: Diagnosis not present

## 2018-03-11 DIAGNOSIS — K648 Other hemorrhoids: Secondary | ICD-10-CM | POA: Diagnosis not present

## 2018-03-11 DIAGNOSIS — F418 Other specified anxiety disorders: Secondary | ICD-10-CM | POA: Diagnosis not present

## 2018-03-11 HISTORY — DX: Cardiac arrhythmia, unspecified: I49.9

## 2018-03-11 HISTORY — PX: COLONOSCOPY WITH PROPOFOL: SHX5780

## 2018-03-11 SURGERY — COLONOSCOPY WITH PROPOFOL
Anesthesia: General

## 2018-03-11 MED ORDER — LIDOCAINE HCL (PF) 2 % IJ SOLN
INTRAMUSCULAR | Status: DC | PRN
  Administered 2018-03-11: 50 mg

## 2018-03-11 MED ORDER — PROPOFOL 500 MG/50ML IV EMUL
INTRAVENOUS | Status: DC | PRN
  Administered 2018-03-11: 50 ug/kg/min via INTRAVENOUS

## 2018-03-11 MED ORDER — PHENYLEPHRINE HCL 10 MG/ML IJ SOLN
INTRAMUSCULAR | Status: DC | PRN
  Administered 2018-03-11: 100 ug via INTRAVENOUS

## 2018-03-11 MED ORDER — LIDOCAINE HCL (PF) 2 % IJ SOLN
INTRAMUSCULAR | Status: AC
  Filled 2018-03-11: qty 10

## 2018-03-11 MED ORDER — FENTANYL CITRATE (PF) 100 MCG/2ML IJ SOLN
INTRAMUSCULAR | Status: DC | PRN
  Administered 2018-03-11 (×4): 25 ug via INTRAVENOUS

## 2018-03-11 MED ORDER — PIPERACILLIN-TAZOBACTAM 3.375 G IVPB 30 MIN
3.3750 g | Freq: Once | INTRAVENOUS | Status: AC
  Administered 2018-03-11: 3.375 g via INTRAVENOUS
  Filled 2018-03-11: qty 50

## 2018-03-11 MED ORDER — PROPOFOL 10 MG/ML IV BOLUS
INTRAVENOUS | Status: DC | PRN
  Administered 2018-03-11: 30 mg via INTRAVENOUS
  Administered 2018-03-11 (×2): 20 mg via INTRAVENOUS

## 2018-03-11 MED ORDER — PIPERACILLIN-TAZOBACTAM 3.375 G IVPB
INTRAVENOUS | Status: AC
  Filled 2018-03-11: qty 50

## 2018-03-11 MED ORDER — MIDAZOLAM HCL 2 MG/2ML IJ SOLN
INTRAMUSCULAR | Status: AC
  Filled 2018-03-11: qty 2

## 2018-03-11 MED ORDER — SODIUM CHLORIDE 0.9 % IV SOLN: INTRAVENOUS | Status: DC

## 2018-03-11 MED ORDER — SODIUM CHLORIDE 0.9 % IV SOLN
INTRAVENOUS | Status: DC
  Administered 2018-03-11: 1000 mL via INTRAVENOUS

## 2018-03-11 MED ORDER — FENTANYL CITRATE (PF) 100 MCG/2ML IJ SOLN
INTRAMUSCULAR | Status: AC
  Filled 2018-03-11: qty 2

## 2018-03-11 NOTE — Anesthesia Postprocedure Evaluation (Signed)
Anesthesia Post Note  Patient: Brittany Mays  Procedure(s) Performed: COLONOSCOPY WITH PROPOFOL (N/A )  Patient location during evaluation: PACU Anesthesia Type: General Level of consciousness: awake and alert Pain management: pain level controlled Vital Signs Assessment: post-procedure vital signs reviewed and stable Respiratory status: spontaneous breathing, nonlabored ventilation, respiratory function stable and patient connected to nasal cannula oxygen Cardiovascular status: blood pressure returned to baseline and stable Postop Assessment: no apparent nausea or vomiting Anesthetic complications: no     Last Vitals:  Vitals:   03/11/18 1219 03/11/18 1229  BP: 106/60 101/69  Pulse: 75 72  Resp: (!) 24 (!) 21  Temp:    SpO2: 98% 100%    Last Pain:  Vitals:   03/11/18 1229  TempSrc:   PainSc: 0-No pain                 Molli Barrows

## 2018-03-11 NOTE — H&P (Signed)
Primary Care Physician:  [name], MD Primary Gastroenterologist:  Dr. Vira Agar  Pre-Procedure History & Physical: HPI:  Brittany Mays is a 77 y.o. female is here for an colonoscopy.  This is for [facility] colon polyps.   Past Medical History:  Diagnosis Date  . Adverse effect of anesthetic    after hip replacement had amnesia for days  . Anxiety   . Aortic aneurysm ([place]) 06/06/2016   small ascending aneurysm   . Cataract   . Chest pain 08/18/2015  . Complication of anesthesia    as a teenager pt. had surgery and surgeon told parents that she should never have pentathal ever again  . Constipation due to opioid therapy   . Decreased muscle strength 02/10/2014  . Depression   . Difficulty in walking(719.7) 02/10/2014  . Dysphagia 08/19/2015  . Dyspnea 08/19/2015  . Dysrhythmia   . GERD (gastroesophageal reflux disease)    occasionally    tums OTC  . History of kidney stones   . Hyperparathyroidism, primary ([place])    s/p parathyroidectomy  . Hyperventilation 08/18/2015  . Insomnia   . Intractable back pain 04/29/2014  . Kidney stone   . Lumbar spinal stenosis    s/p laminectomy  . Odynophagia 08/18/2015  . Osteoarthritis of both knees    s/p knee replacements  . Pain   . Rectal prolapse    s/p repair  . Unintentional weight loss 09/08/2015  . UTI (lower urinary tract infection) 04/28/2014    Past Surgical History:  Procedure Laterality Date  . ABDOMINAL HYSTERECTOMY    . APPENDECTOMY    . BACK SURGERY  07-2015    total 4 major back surgeries  . BREAST LUMPECTOMY     Reports this is benign  . COLONOSCOPY  2013  . INGUINAL HERNIA REPAIR Left 12/03/2017   Procedure: LEFT INGUINAL HERNIA REPAIR WITH MESH;  Surgeon: Coralie Keens, MD;  Location: [place];  Service: General;  Laterality: Left;  LMA  . JOINT REPLACEMENT     only did left hip.  only 1 surgery  . PARATHYROIDECTOMY  1990's  . ROTATOR CUFF REPAIR     right  . SPINE SURGERY    . TONSILLECTOMY    . TOTAL  HIP ARTHROPLASTY Left 09/18/2017  . TOTAL HIP ARTHROPLASTY Left 09/18/2017   Procedure: LEFT TOTAL HIP ARTHROPLASTY ANTERIOR APPROACH;  Surgeon: Mcarthur Rossetti, MD;  Location: [employer];  Service: Orthopedics;  Laterality: Left;  . TOTAL KNEE ARTHROPLASTY Bilateral 1990's nd 2002    Prior to Admission medications   Medication Sig Start Date End Date Taking? Authorizing Provider  ascorbic acid (VITAMIN C) 500 MG tablet Take 500 mg by mouth daily.   Yes [provider]  cyclobenzaprine (FLEXERIL) 5 MG tablet TAKE 1 TABLET BY MOUTH THREE TIMES DAY AS NEEDED FOR MUSCLE SPASMS 10/06/17  Yes [provider]  dicyclomine (BENTYL) 20 MG tablet Take 20 mg by mouth every 6 (six) hours as needed for spasms.    Yes [provider]  docusate sodium (COLACE) 100 MG capsule Take 200 mg by mouth daily.   Yes [provider]  DULoxetine (CYMBALTA) 60 MG capsule TAKE 1 CAPSULE EVERY DAY 02/06/18  Yes [name], MD  Melatonin 5 MG CAPS Take 5 mg by mouth at bedtime.   Yes [provider]  Multiple Vitamin (MULTIVITAMIN) tablet Take 1 tablet by mouth daily.   Yes [provider]  Omega-3 Fatty Acids (OMEGA 3 500  PO) Take 500 mg by mouth daily.   Yes [provider]  oxyCODONE (OXY IR/ROXICODONE) 5 MG immediate release tablet Take 1-2 tablets (5-10 mg total) by mouth every 6 (six) hours as needed for moderate pain, severe pain or breakthrough pain. 12/04/17  Yes Coralie Keens, MD  Oxycodone HCl 10 MG TABS Take 1 tablet (10 mg total) by mouth every 6 (six) hours as needed. 04/06/18 05/06/18 Yes Vevelyn Francois, NP  Oxycodone HCl 10 MG TABS Take 1 tablet (10 mg total) by mouth every 6 (six) hours as needed. 03/07/18 04/06/18 Yes King, Diona Foley, NP  primidone (MYSOLINE) 250 MG tablet Take 25 mg by mouth at bedtime.    Yes [provider]  Probiotic Product (ALIGN EXTRA STRENGTH PO) Take 1 capsule by mouth daily.    Yes [provider]  rosuvastatin (CRESTOR) 20 MG tablet Take 1 tablet (20 mg total) by mouth daily. 11/21/17  Yes [name], MD  polyethylene glycol [facility] / GLYCOLAX) packet Take 17 g by mouth daily. Mix one tablespoon with 8oz of your favorite juice or water every day until you are having soft formed stools. Then start taking once daily if you didn't have a stool the day before. Patient taking differently: Take 17 g by mouth daily as needed for mild constipation. Mix one tablespoon with 8oz of your favorite juice or water every day until you are having soft formed stools. Then start taking once daily if you didn't have a stool the day before. 06/25/17   Merlyn Lot, MD    Allergies as of 09/24/2017 - Review Complete 09/18/2017  Allergen Reaction Noted  . Fentanyl Itching, Nausea And Vomiting, Rash, and Other (See Comments) 08/25/2012  . Nsaids Diarrhea, Nausea And Vomiting, Rash, and Other (See Comments) 06/23/2013  . Pentothal [thiopental] Nausea And Vomiting, Rash, and Other (See Comments) 07/29/2015    Family History  Problem Relation Age of Onset  . Mental illness Mother   . COPD Father   . Heart failure Father   . Diabetes Son     Social History   Socioeconomic History  . Marital status: Married    Spouse name: Not on file  . Number of children: Not on file  . Years of education: Not on file  . Highest education level: Not on file  Occupational History  . Not on file  Social Needs  . Financial resource strain: Not on file  . Food insecurity:    Worry: Not on file    Inability: Not on file  . Transportation needs:    Medical: Not on file    Non-medical: Not on file  Tobacco Use  . Smoking status: Current Every Day Smoker    Packs/day: 0.50    Years: 50.00    Pack years: 25.00    Types: Cigarettes  . Smokeless tobacco: Never Used  Substance and Sexual Activity  . Alcohol use: No    Alcohol/week: 0.0 oz  . Drug use: No  . Sexual activity: Not Currently  Lifestyle   . Physical activity:    Days per week: Not on file    Minutes per session: Not on file  . Stress: Not on file  Relationships  . Social connections:    Talks on phone: Not on file    Gets together: Not on file    Attends religious service: Not on file    Active member of club or organization: Not on file    Attends  meetings of clubs or organizations: Not on file    Relationship status: Not on file  . Intimate partner violence:    Fear of current or ex partner: Not on file    Emotionally abused: Not on file    Physically abused: Not on file    Forced sexual activity: Not on file  Other Topics Concern  . Not on file  Social History Narrative  . Not on file    Review of Systems: See HPI, otherwise negative ROS  Physical Exam: BP 103/73   Pulse (!) 104   Temp (!) 96.5 F (35.8 C) (Tympanic)   Resp 20   Ht 5' (1.524 m)   Wt 49.9 kg (110 lb)   SpO2 99%   BMI 21.48 kg/m  General:   Alert,  pleasant and cooperative in NAD Head:  Normocephalic and atraumatic. Neck:  Supple; no masses or thyromegaly. Lungs:  Clear throughout to auscultation.    Heart:  Regular rate and rhythm. Abdomen:  Soft, nontender and nondistended. Normal bowel sounds, without guarding, and without rebound.   Neurologic:  Alert and  oriented x4;  grossly normal neurologically.  Impression/Plan: JOLYNDA TOWNLEY is here for an colonoscopy to be performed for [facility] colon polyps.  Risks, benefits, limitations, and alternatives regarding  colonoscopy have been reviewed with the patient.  Questions have been answered.  All parties agreeable.   Gaylyn Cheers, MD  03/11/2018, 11:13 AM

## 2018-03-11 NOTE — Op Note (Signed)
[facility] Gastroenterology Patient Name: Brittany Mays Procedure Date: 03/11/2018 11:12 AM MRN: [id number] Account #: [id number] Date of Birth: [birth year] Admit Type: Outpatient Age: 77 Room: [facility] ENDO ROOM 1 Gender: Female Note Status: Finalized Procedure:            Colonoscopy Indications:          High risk colon cancer surveillance: Personal history                        of colonic polyps Providers:            Manya Silvas, MD Referring MD:         Angela Adam. Caryl Bis (Referring MD) Medicines:            Propofol per Anesthesia Complications:        No immediate complications. Procedure:            Pre-Anesthesia Assessment:                       - After reviewing the risks and benefits, the patient                        was deemed in satisfactory condition to undergo the                        procedure.                       After obtaining informed consent, the colonoscope was                        passed under direct vision. Throughout the procedure,                        the patient's blood pressure, pulse, and oxygen                        saturations were monitored continuously. The                        Colonoscope was introduced through the anus and                        advanced to the the cecum, identified by appendiceal                        orifice and ileocecal valve. The colonoscopy was                        performed without difficulty. The patient tolerated the                        procedure well. The quality of the bowel preparation                        was good. Findings:      A small polyp was found in the transverse colon. The polyp was sessile.       The polyp was removed with a hot snare. Resection and retrieval were       complete.      Multiple small-mouthed diverticula were found in  the sigmoid colon,       descending colon and transverse colon.      Internal hemorrhoids were found during endoscopy. The hemorrhoids  were       small and Grade I (internal hemorrhoids that do not prolapse).      The exam was otherwise without abnormality. Impression:           - One small polyp in the transverse colon, removed with                        a hot snare. Resected and retrieved.                       - Diverticulosis in the sigmoid colon, in the                        descending colon and in the transverse colon.                       - Internal hemorrhoids.                       - The examination was otherwise normal. Recommendation:       - Await pathology results. Manya Silvas, MD 03/11/2018 11:58:03 AM This report has been signed electronically. Number of Addenda: 0 Note Initiated On: 03/11/2018 11:12 AM Scope Withdrawal Time: 0 hours 12 minutes 29 seconds  Total Procedure Duration: 0 hours 24 minutes 35 seconds       [facility]

## 2018-03-11 NOTE — Anesthesia Post-op Follow-up Note (Signed)
Anesthesia QCDR form completed.        

## 2018-03-11 NOTE — Anesthesia Preprocedure Evaluation (Signed)
Anesthesia Evaluation  Patient identified by MRN, date of birth, ID band Patient awake    Reviewed: Allergy & Precautions, H&P , NPO status , Patient's Chart, lab work & pertinent test results  History of Anesthesia Complications (+) PROLONGED EMERGENCE, Emergence Delirium and history of anesthetic complications  Airway Mallampati: III  TM Distance: <3 FB Neck ROM: limited    Dental  (+) Edentulous Upper, Edentulous Lower, Upper Dentures, Lower Dentures, Poor Dentition, Missing   Pulmonary shortness of breath and with exertion, COPD, Current Smoker,           Cardiovascular Exercise Tolerance: Good (-) angina+ Peripheral Vascular Disease  (-) Past MI and (-) DOE negative cardio ROS  + dysrhythmias      Neuro/Psych PSYCHIATRIC DISORDERS Anxiety Depression  Neuromuscular disease    GI/Hepatic Neg liver ROS, GERD  Medicated and Controlled,  Endo/Other  negative endocrine ROS  Renal/GU Renal disease  negative genitourinary   Musculoskeletal  (+) Arthritis ,   Abdominal   Peds  Hematology negative hematology ROS (+)   Anesthesia Other Findings Past Medical History: No date: Adverse effect of anesthetic     Comment:  after hip replacement had amnesia for days No date: Anxiety 06/06/2016: Aortic aneurysm (HCC)     Comment:  small ascending aneurysm  No date: Cataract 08/18/2015: Chest pain No date: Complication of anesthesia     Comment:  as a teenager pt. had surgery and surgeon told parents               that she should never have pentathal ever again No date: Constipation due to opioid therapy 02/10/2014: Decreased muscle strength No date: Depression 02/10/2014: Difficulty in walking(719.7) 08/19/2015: Dysphagia 08/19/2015: Dyspnea No date: Dysrhythmia No date: GERD (gastroesophageal reflux disease)     Comment:  occasionally    tums OTC No date: History of kidney stones No date: Hyperparathyroidism, primary  ([place])     Comment:  s/p parathyroidectomy 08/18/2015: Hyperventilation No date: Insomnia 04/29/2014: Intractable back pain No date: Kidney stone No date: Lumbar spinal stenosis     Comment:  s/p laminectomy 08/18/2015: Odynophagia No date: Osteoarthritis of both knees     Comment:  s/p knee replacements No date: Pain No date: Rectal prolapse     Comment:  s/p repair 09/08/2015: Unintentional weight loss 04/28/2014: UTI (lower urinary tract infection)  Past Surgical History: No date: ABDOMINAL HYSTERECTOMY No date: APPENDECTOMY 07-2015: BACK SURGERY     Comment:   total 4 major back surgeries No date: BREAST LUMPECTOMY     Comment:  Reports this is benign 2013: COLONOSCOPY 12/03/2017: INGUINAL HERNIA REPAIR; Left     Comment:  Procedure: LEFT INGUINAL HERNIA REPAIR WITH MESH;                Surgeon: Coralie Keens, MD;  Location: [place];                Service: General;  Laterality: Left;  LMA No date: JOINT REPLACEMENT     Comment:  only did left hip.  only 1 surgery 1990's: PARATHYROIDECTOMY No date: ROTATOR CUFF REPAIR     Comment:  right No date: SPINE SURGERY No date: TONSILLECTOMY 09/18/2017: TOTAL HIP ARTHROPLASTY; Left 09/18/2017: TOTAL HIP ARTHROPLASTY; Left     Comment:  Procedure: LEFT TOTAL HIP ARTHROPLASTY ANTERIOR               APPROACH;  Surgeon: Mcarthur Rossetti, MD;  Location: [place];  Service: Orthopedics;  Laterality:               Left; 1990's nd 2002: TOTAL KNEE ARTHROPLASTY; Bilateral  BMI    Body Mass Index:  21.48 kg/m      Reproductive/Obstetrics negative OB ROS                             Anesthesia Physical Anesthesia Plan  ASA: III  Anesthesia Plan: General   Post-op Pain Management:    Induction: Intravenous  PONV Risk Score and Plan: Propofol infusion and TIVA  Airway Management Planned: Natural Airway and Nasal Cannula  Additional Equipment:   Intra-op Plan:   Post-operative  Plan:   Informed Consent: I have reviewed the patients History and Physical, chart, labs and discussed the procedure including the risks, benefits and alternatives for the proposed anesthesia with the patient or authorized representative who has indicated his/her understanding and acceptance.   Dental Advisory Given  Plan Discussed with: Anesthesiologist, CRNA and Surgeon  Anesthesia Plan Comments: (Patient consented for risks of anesthesia including but not limited to:  - adverse reactions to medications - risk of intubation if required - damage to teeth, lips or other oral mucosa - sore throat or hoarseness - Damage to heart, brain, lungs or loss of life  Patient voiced understanding.)        Anesthesia Quick Evaluation

## 2018-03-11 NOTE — Transfer of Care (Signed)
Immediate Anesthesia Transfer of Care Note  Patient: Brittany Mays  Procedure(s) Performed: COLONOSCOPY WITH PROPOFOL (N/A )  Patient Location: PACU  Anesthesia Type:General  Level of Consciousness: sedated  Airway & Oxygen Therapy: Patient Spontanous Breathing and Patient connected to nasal cannula oxygen  Post-op Assessment: Report given to RN and Post -op Vital signs reviewed and stable  Post vital signs: Reviewed and stable  Last Vitals:  Vitals Value Taken Time  BP 158/130 03/11/2018 11:59 AM  Temp    Pulse 82 03/11/2018 11:59 AM  Resp 19 03/11/2018 11:59 AM  SpO2 100 % 03/11/2018 11:59 AM  Vitals shown include unvalidated device data.  Last Pain:  Vitals:   03/11/18 1027  TempSrc: Tympanic  PainSc: 6          Complications: No apparent anesthesia complications

## 2018-03-12 ENCOUNTER — Encounter: Payer: Self-pay | Admitting: Unknown Physician Specialty

## 2018-03-12 ENCOUNTER — Encounter: Payer: Self-pay | Admitting: Family Medicine

## 2018-03-12 LAB — SURGICAL PATHOLOGY

## 2018-03-13 ENCOUNTER — Other Ambulatory Visit: Payer: Self-pay

## 2018-03-13 NOTE — Patient Outreach (Signed)
[place] [facility]) Care Management  03/13/2018  Brittany Mays September 26, 1941 [id number]   Telephone call to patient for follow up after colonoscopy.  No answer. Unable to leave a message.  Plan: RN CM will attempt patient again in the month of May.  [name], RN, MSN [facility] Care Management Care Management Coordinator Direct Line [phone] Toll Free: [phone]  Fax: [phone]

## 2018-03-14 ENCOUNTER — Other Ambulatory Visit: Payer: Self-pay | Admitting: *Deleted

## 2018-03-14 NOTE — Patient Outreach (Signed)
[facility]) Care Management  03/14/2018  AMANDA STEUART 03-Oct-1941 [id number]   CSW made an attempt to reach patient to follow-up on Independent Living Facilities, but no answer. CSW was able to leave a HIPPA compliant message on her voicemail. CSW is currently awaiting a return call. CSW will make a second outreach attempt within the next few days, if CSW does not receive a return call from patient in the meantime.    Raynaldo Opitz, LCSW [facility]  Clinical Social Worker cell #: [phone]

## 2018-03-18 ENCOUNTER — Other Ambulatory Visit: Payer: Self-pay

## 2018-03-18 ENCOUNTER — Other Ambulatory Visit: Payer: Self-pay | Admitting: *Deleted

## 2018-03-18 NOTE — Patient Outreach (Signed)
[place] [facility]) Care Management  [place]  03/18/2018   Brittany Mays Mar 07, 1941 [id number]  Subjective: Telephone call to patient for follow up assessment.  Patient able to verify HIPAA.  Patient reports that she is doing surprisingly well. She reports that her colonoscopy went well.  She reports that they found and removed a polyp. She said they also found some diverticulosis and internal hemorrhoids.  Patient reports that she is watching her diet in order to present a diverticulosis flair. Patient reports that she is having regular bowel movements and some queasiness but is taking her bentyl to help.  Reiterated with patient continuing bowel regimen and adding fiber to her diet.  She verbalized understanding.    Objective:   Encounter Medications:  Outpatient Encounter Medications as of 03/18/2018  Medication Sig Note  . ascorbic acid (VITAMIN C) 500 MG tablet Take 500 mg by mouth daily.   . cyclobenzaprine (FLEXERIL) 5 MG tablet TAKE 1 TABLET BY MOUTH THREE TIMES DAY AS NEEDED FOR MUSCLE SPASMS   . dicyclomine (BENTYL) 20 MG tablet Take 20 mg by mouth every 6 (six) hours as needed for spasms.  01/22/2018: Patient taking TID  . docusate sodium (COLACE) 100 MG capsule Take 200 mg by mouth daily. 12/07/2017: Taking 100 mg BID  . DULoxetine (CYMBALTA) 60 MG capsule TAKE 1 CAPSULE EVERY DAY   . Melatonin 5 MG CAPS Take 5 mg by mouth at bedtime.   . Multiple Vitamin (MULTIVITAMIN) tablet Take 1 tablet by mouth daily.   . Omega-3 Fatty Acids (OMEGA 3 500 PO) Take 500 mg by mouth daily.   Marland Kitchen oxyCODONE (OXY IR/ROXICODONE) 5 MG immediate release tablet Take 1-2 tablets (5-10 mg total) by mouth every 6 (six) hours as needed for moderate pain, severe pain or breakthrough pain.   Derrill Memo ON 04/06/2018] Oxycodone HCl 10 MG TABS Take 1 tablet (10 mg total) by mouth every 6 (six) hours as needed.   . polyethylene glycol (MIRALAX / GLYCOLAX) packet Take 17 g by mouth daily. Mix one  tablespoon with 8oz of your favorite juice or water every day until you are having soft formed stools. Then start taking once daily if you didn't have a stool the day before. (Patient taking differently: Take 17 g by mouth daily as needed for mild constipation. Mix one tablespoon with 8oz of your favorite juice or water every day until you are having soft formed stools. Then start taking once daily if you didn't have a stool the day before.)   . primidone (MYSOLINE) 250 MG tablet Take 25 mg by mouth at bedtime.  12/07/2017: Taking 25 mg Daily  . Probiotic Product (ALIGN EXTRA STRENGTH PO) Take 1 capsule by mouth daily.    . rosuvastatin (CRESTOR) 20 MG tablet Take 1 tablet (20 mg total) by mouth daily.   . Oxycodone HCl 10 MG TABS Take 1 tablet (10 mg total) by mouth every 6 (six) hours as needed. (Patient not taking: Reported on 03/18/2018)    No facility-administered encounter medications on file as of 03/18/2018.     Functional Status:  In your present state of health, do you have any difficulty performing the following activities: 12/14/2017 12/03/2017  Hearing? N N  Vision? N N  Difficulty concentrating or making decisions? N N  Walking or climbing stairs? N N  Dressing or bathing? N N  Doing errands, shopping? N N  Preparing Food and eating ? N -  Using the Toilet? N -  In the past six months, have you accidently leaked urine? N -  Do you have problems with loss of bowel control? N -  Managing your Medications? N -  Managing your Finances? N -  Housekeeping or managing your Housekeeping? N -  Some recent data might be hidden    Fall/Depression Screening: Fall Risk  02/05/2018 12/14/2017 11/15/2017  Falls in the past year? No No No  Number falls in past yr: - - -  Injury with Fall? - - -   PHQ 2/9 Scores 02/05/2018 12/14/2017 11/15/2017 10/08/2017 08/09/2017 05/29/2017 04/24/2017  PHQ - 2 Score 0 1 0 0 0 0 0  PHQ- 9 Score - - - - - - -  Exception Documentation - - - - - - -    Assessment:  Patient continues to benefit from care manager outreach for disease management and support.    Plan:  [facility] CM Care Plan Problem One     Most Recent Value  Care Plan Problem One  Recent Hernia Repair  Role Documenting the Problem One  Care Management Telephonic Coordinator  Care Plan for Problem One  Active  [facility] CM Short Term Goal #2   Patient will report less episodes of constipation within 30 days.  [facility] CM Short Term Goal #2 Start Date  03/18/18  Interventions for Short Term Goal #2  Patient reports that she continues with her bowel regimen.  RN CM discussed the importance of increasing fiber in her diet.       RN CM will contact patient in the month of June and patient agrees to next outreach.  [name], RN, MSN [facility] Care Management Care Management Coordinator Direct Line [phone] Toll Free: [phone]  Fax: [phone]

## 2018-03-18 NOTE — Patient Outreach (Signed)
[facility]) Care Management  03/18/2018  Brittany Mays 26-Oct-1941 [id number]   CSW made second attempt to reach patient to follow-up on housing & independent living facilities but no answer. CSW was able to leave HIPPA compliant voicemail and will have CMA mail unsuccessful outreach letter. CSW will try again in 10 days for third attempt if no response in the meantime.    Raynaldo Opitz, LCSW [facility]  Clinical Social Worker cell #: [phone]

## 2018-03-21 ENCOUNTER — Ambulatory Visit: Payer: Medicare HMO | Attending: Nurse Practitioner | Admitting: Pain Medicine

## 2018-03-21 DIAGNOSIS — M47817 Spondylosis without myelopathy or radiculopathy, lumbosacral region: Secondary | ICD-10-CM | POA: Insufficient documentation

## 2018-03-21 NOTE — Progress Notes (Deleted)
Patient's Name: Brittany Mays  MRN: [id number]  Referring Provider: Vevelyn Francois, NP  DOB: 11-05-1941  PCP: [name], MD  DOS: 03/21/2018  Note by: Gaspar Cola, MD  Service setting: Ambulatory outpatient  Specialty: Interventional Pain Management  Patient type: Established  Location: [facility] (AMB) Pain Management Facility  Visit type: Interventional Procedure   Primary Reason for Visit: Interventional Pain Management Treatment. CC: No chief complaint on file.  Procedure:       Anesthesia, Analgesia, Anxiolysis:  Type: Thermal Lumbar Facet, Medial Branch Radiofrequency Neurotomy Level: L2, L3, L4, L5, & S1 Medial Branch Level(s). These levels will denervate the L3-4, L4-5, and the L5-S1 lumbar facet joints. Primary Purpose: Therapeutic Region: Posterolateral Lumbosacral Spine Laterality: Left  Type: Moderate (Conscious) Sedation combined with Local Anesthesia Indication(s): Analgesia and Anxiety Route: Intravenous (IV) IV Access: Secured Sedation: Meaningful verbal contact was maintained at all times during the procedure  Local Anesthetic: Lidocaine 1-2%   Indications: 1. Spondylosis without myelopathy or radiculopathy, lumbosacral region   2. Lumbar facet syndrome (Bilateral) (R>L)   3. Lumbar facet arthropathy ([place])   4. Lumbar spondylolisthesis   5. Chronic low back pain (Location of Primary Source of Pain) (Bilateral) (R>L)    Brittany Mays has been dealing with the above chronic pain for longer than three months and has either failed to respond, was unable to tolerate, or simply did not get enough benefit from other more conservative therapies including, but not limited to: 1. Over-the-counter medications 2. Anti-inflammatory medications 3. Muscle relaxants 4. Membrane stabilizers 5. Opioids 6. Physical therapy 7. Modalities (Heat, ice, etc.) 8. Invasive techniques such as nerve blocks. Brittany Mays has attained more than 50% relief of the pain from a series of  diagnostic injections conducted in separate occasions.  Pain Score: Pre-procedure:  /10 Post-procedure:  /10  Pre-op Assessment:  Brittany Mays is a 77 y.o. (year old), female patient, seen today for interventional treatment. 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 Brittany Mays has a current medication list which includes the following prescription(s): ascorbic acid, cyclobenzaprine, dicyclomine, docusate sodium, duloxetine, melatonin, multivitamin, omega-3 fatty acids, oxycodone, oxycodone hcl, oxycodone hcl, polyethylene glycol, primidone, probiotic product, and rosuvastatin. Her primarily concern today is the No chief complaint on file.  Initial Vital Signs:  Pulse/HCG Rate:    Temp:   Resp:   BP:   SpO2:    BMI: Estimated body mass index is 21.48 kg/m as calculated from the following:   Height as of 03/11/18: 5' (1.524 m).   Weight as of 03/11/18: 110 lb (49.9 kg).  Risk Assessment: Allergies: Reviewed. She is allergic to fentanyl; nsaids; and pentothal [thiopental].  Allergy Precautions: None required Coagulopathies: Reviewed. None identified.  Blood-thinner therapy: None at this time Active Infection(s): Reviewed. None identified. Brittany Mays is afebrile  Site Confirmation: Brittany Mays was asked to confirm the procedure and laterality before marking the site Procedure checklist: Completed Consent: Before the procedure and under the influence of no sedative(s), amnesic(s), or anxiolytics, the patient was informed of the treatment options, risks and possible  complications. To fulfill our ethical and legal obligations, as recommended by the American Medical Association's Code of Ethics, I have informed the  patient of my clinical impression; the nature and purpose of the treatment or procedure; the risks, benefits, and possible complications of the intervention; the alternatives, including doing nothing; the risk(s) and benefit(s) of the alternative treatment(s) or procedure(s); and the risk(s) and benefit(s) of doing nothing. The patient was provided information about the general risks and possible complications associated with the procedure. These may include, but are not limited to: failure to achieve desired goals, infection, bleeding, organ or nerve damage, allergic reactions, paralysis, and death. In addition, the patient was informed of those risks and complications associated to Spine-related procedures, such as failure to decrease pain; infection (i.e.: Meningitis, epidural or intraspinal abscess); bleeding (i.e.: epidural hematoma, subarachnoid hemorrhage, or any other type of intraspinal or peri-dural bleeding); organ or nerve damage (i.e.: Any type of peripheral nerve, nerve root, or spinal cord injury) with subsequent damage to sensory, motor, and/or autonomic systems, resulting in permanent pain, numbness, and/or weakness of one or several areas of the body; allergic reactions; (i.e.: anaphylactic reaction); and/or death. Furthermore, the patient was informed of those risks and complications associated with the medications. These include, but are not limited to: allergic reactions (i.e.: anaphylactic or anaphylactoid reaction(s)); adrenal axis suppression; blood sugar elevation that in diabetics may result in ketoacidosis or comma; water retention that in patients with history of congestive heart failure may result in shortness of breath, pulmonary edema, and decompensation with resultant heart failure; weight gain; swelling or edema; medication-induced  neural toxicity; particulate matter embolism and blood vessel occlusion with resultant organ, and/or nervous system infarction; and/or aseptic necrosis of one or more joints. Finally, the patient was informed that Medicine is not an exact science; therefore, there is also the possibility of unforeseen or unpredictable risks and/or possible complications that may result in a catastrophic outcome. The patient indicated having understood very clearly. We have given the patient no guarantees and we have made no promises. Enough time was given to the patient to ask questions, all of which were answered to the patient's satisfaction. Brittany Mays has indicated that she wanted to continue with the procedure. Attestation: I, the ordering provider, attest that I have discussed with the patient the benefits, risks, side-effects, alternatives, likelihood of achieving goals, and potential problems during recovery for the procedure that I have provided informed consent. Date  Time: {CHL [facility]-PAIN TIME CHOICES:[id number]}  Pre-Procedure Preparation:  Monitoring: As per clinic protocol. Respiration, ETCO2, SpO2, BP, heart rate and rhythm monitor placed and checked for adequate function Safety Precautions: Patient was assessed for positional comfort and pressure points before starting the procedure. Time-out: I initiated and conducted the "Time-out" before starting the procedure, as per protocol. The patient was asked to participate by confirming the accuracy of the "Time Out" information. Verification of the correct person, site, and procedure were performed and confirmed by me, the nursing staff, and the patient. "Time-out" conducted as per Joint Commission's Universal Protocol (UP.01.01.01). Time:    Description of Procedure:       Position: Prone Laterality: Left Levels:  L2, L3, L4, L5, & S1 Medial Branch Level(s), at the L3-4, L4-5, and the L5-S1 lumbar facet joints. Area Prepped: Lumbosacral Prepping solution:  ChloraPrep (2% chlorhexidine gluconate and 70% isopropyl alcohol) Safety Precautions: Aspiration looking for blood return was conducted prior to all injections. At no point did we inject any substances, as a needle was being advanced. Before injecting, the patient was told to immediately notify me if she was experiencing any new onset of "ringing in the ears,  or metallic taste in the mouth". No attempts were made at seeking any paresthesias. Safe injection practices and needle disposal techniques used. Medications properly checked for expiration dates. SDV (single dose vial) medications used. After the completion of the procedure, all disposable equipment used was discarded in the proper designated medical waste containers. Local Anesthesia: Protocol guidelines were followed. The patient was positioned over the fluoroscopy table. The area was prepped in the usual manner. The time-out was completed. The target area was identified using fluoroscopy. A 12-in long, straight, sterile hemostat was used with fluoroscopic guidance to locate the targets for each level blocked. Once located, the skin was marked with an approved surgical skin marker. Once all sites were marked, the skin (epidermis, dermis, and hypodermis), as well as deeper tissues (fat, connective tissue and muscle) were infiltrated with a small amount of a short-acting local anesthetic, loaded on a 10cc syringe with a 25G, 1.5-in  Needle. An appropriate amount of time was allowed for local anesthetics to take effect before proceeding to the next step. Local Anesthetic: Lidocaine 2.0% The unused portion of the local anesthetic was discarded in the proper designated containers. Technical explanation of process:  Radiofrequency Ablation (RFA) L2 Medial Branch Nerve RFA: The target area for the L2 medial branch is at the junction of the postero-lateral aspect of the superior articular process and the superior, posterior, and medial edge of the transverse  process of L3. Under fluoroscopic guidance, a Radiofrequency needle was inserted until contact was made with os over the superior postero-lateral aspect of the pedicular shadow (target area). Sensory and motor testing was conducted to properly adjust the position of the needle. Once satisfactory placement of the needle was achieved, the numbing solution was slowly injected after negative aspiration for blood. 2.0 mL of the nerve block solution was injected without difficulty or complication. After waiting for at least 3 minutes, the ablation was performed. Once completed, the needle was removed intact. L3 Medial Branch Nerve RFA: The target area for the L3 medial branch is at the junction of the postero-lateral aspect of the superior articular process and the superior, posterior, and medial edge of the transverse process of L4. Under fluoroscopic guidance, a Radiofrequency needle was inserted until contact was made with os over the superior postero-lateral aspect of the pedicular shadow (target area). Sensory and motor testing was conducted to properly adjust the position of the needle. Once satisfactory placement of the needle was achieved, the numbing solution was slowly injected after negative aspiration for blood. 2.0 mL of the nerve block solution was injected without difficulty or complication. After waiting for at least 3 minutes, the ablation was performed. Once completed, the needle was removed intact. L4 Medial Branch Nerve RFA: The target area for the L4 medial branch is at the junction of the postero-lateral aspect of the superior articular process and the superior, posterior, and medial edge of the transverse process of L5. Under fluoroscopic guidance, a Radiofrequency needle was inserted until contact was made with os over the superior postero-lateral aspect of the pedicular shadow (target area). Sensory and motor testing was conducted to properly adjust the position of the needle. Once satisfactory  placement of the needle was achieved, the numbing solution was slowly injected after negative aspiration for blood. 2.0 mL of the nerve block solution was injected without difficulty or complication. After waiting for at least 3 minutes, the ablation was performed. Once completed, the needle was removed intact. L5 Medial Branch Nerve RFA: The target area for  the L5 medial branch is at the junction of the postero-lateral aspect of the superior articular process of S1 and the superior, posterior, and medial edge of the sacral ala. Under fluoroscopic guidance, a Radiofrequency needle was inserted until contact was made with os over the superior postero-lateral aspect of the pedicular shadow (target area). Sensory and motor testing was conducted to properly adjust the position of the needle. Once satisfactory placement of the needle was achieved, the numbing solution was slowly injected after negative aspiration for blood. 2.0 mL of the nerve block solution was injected without difficulty or complication. After waiting for at least 3 minutes, the ablation was performed. Once completed, the needle was removed intact. S1 Medial Branch Nerve RFA: The target area for the S1 medial branch is located inferior to the junction of the S1 superior articular process and the L5 inferior articular process, posterior, inferior, and lateral to the 6 o'clock position of the L5-S1 facet joint, just superior to the S1 posterior foramen. Under fluoroscopic guidance, the Radiofrequency needle was advanced until contact was made with os over the Target area. Sensory and motor testing was conducted to properly adjust the position of the needle. Once satisfactory placement of the needle was achieved, the numbing solution was slowly injected after negative aspiration for blood. 2.0 mL of the nerve block solution was injected without difficulty or complication. After waiting for at least 3 minutes, the ablation was performed. Once completed,  the needle was removed intact. Radiofrequency lesioning (ablation):  Radiofrequency Generator: NeuroTherm [id number] Sensory Stimulation Parameters: 50 Hz was used to locate & identify the nerve, making sure that the needle was positioned such that there was no sensory stimulation below 0.3 V or above 0.7 V. Motor Stimulation Parameters: 2 Hz was used to evaluate the motor component. Care was taken not to lesion any nerves that demonstrated motor stimulation of the lower extremities at an output of less than 2.5 times that of the sensory threshold, or a maximum of 2.0 V. Lesioning Technique Parameters: Standard Radiofrequency settings. (Not bipolar or pulsed.) Temperature Settings: 80 degrees C Lesioning time: 60 seconds Intra-operative Compliance: Compliant Materials & Medications: Needle(s) (Electrode/Cannula) Type: Teflon-coated, curved tip, Radiofrequency needle(s) Gauge: 22G Length: 10cm Numbing solution: 0.2% PF-Ropivacaine + Triamcinolone (40 mg/mL) diluted to a final concentration of 4 mg of Triamcinolone/mL of Ropivacaine The unused portion of the solution was discarded in the proper designated containers.  Once the entire procedure was completed, the treated area was cleaned, making sure to leave some of the prepping solution back to take advantage of its long term bactericidal properties.  Illustration of the posterior view of the lumbar spine and the posterior neural structures. Laminae of L2 through S1 are labeled. DPRL5, dorsal primary ramus of L5; DPRS1, dorsal primary ramus of S1; DPR3, dorsal primary ramus of L3; FJ, facet (zygapophyseal) joint L3-L4; I, inferior articular process of L4; LB1, lateral branch of dorsal primary ramus of L1; IAB, inferior articular branches from L3 medial branch (supplies L4-L5 facet joint); IBP, intermediate branch plexus; MB3, medial branch of dorsal primary ramus of L3; NR3, third lumbar nerve root; S, superior articular process of L5; SAB, superior  articular branches from L4 (supplies L4-5 facet joint also); TP3, transverse process of L3.  There were no vitals filed for this visit.  Start Time:   hrs. End Time:   hrs.  Imaging Guidance (Spinal):  Type of Imaging Technique: Fluoroscopy Guidance (Spinal) Indication(s): Assistance in needle guidance and placement for procedures requiring needle  placement in or near specific anatomical locations not easily accessible without such assistance. Exposure Time: Please see nurses notes. Contrast: None used. Fluoroscopic Guidance: I was personally present during the use of fluoroscopy. "Tunnel Vision Technique" used to obtain the best possible view of the target area. Parallax error corrected before commencing the procedure. "Direction-depth-direction" technique used to introduce the needle under continuous pulsed fluoroscopy. Once target was reached, antero-posterior, oblique, and lateral fluoroscopic projection used confirm needle placement in all planes. Images permanently stored in EMR. Interpretation: No contrast injected. I personally interpreted the imaging intraoperatively. Adequate needle placement confirmed in multiple planes. Permanent images saved into the patient's record.  Antibiotic Prophylaxis:   Anti-infectives (From admission, onward)   None     Indication(s): None identified  Post-operative Assessment:  Post-procedure Vital Signs:  Pulse/HCG Rate:    Temp:   Resp:   BP:   SpO2:    EBL: None  Complications: No immediate post-treatment complications observed by team, or reported by patient.  Note: The patient tolerated the entire procedure well. A repeat set of vitals were taken after the procedure and the patient was kept under observation following institutional policy, for this type of procedure. Post-procedural neurological assessment was performed, showing return to baseline, prior to discharge. The patient was provided with post-procedure discharge instructions,  including a section on how to identify potential problems. Should any problems arise concerning this procedure, the patient was given instructions to immediately contact us, at any time, without hesitation. In any case, we plan to contact the patient by telephone for a follow-up status report regarding this interventional procedure.  Comments:  No additional relevant information.  Plan of Care   Imaging Orders  No imaging studies ordered today   Procedure Orders    No procedure(s) ordered today    Medications ordered for procedure: No orders of the defined types were placed in this encounter.  Medications administered: Brittany Mays had no medications administered during this visit.  See the medical record for exact dosing, route, and time of administration.  New Prescriptions   No medications on file   Disposition: Discharge home  Discharge Date & Time: 03/21/2018;   hrs.   Physician-requested Follow-up: No follow-ups on file.  Future Appointments  Date Time Provider [place]  03/21/2018 10:30 AM Milinda Pointer, MD [facility] None  04/02/2018 12:00 PM Standley Brooking, LCSW [facility] None  04/15/2018 11:00 AM Jon Billings, RN [facility] None  05/07/2018 12:45 PM Vevelyn Francois, NP [facility] None  05/08/2018  2:45 PM [name], MD [facility]  07/22/2018  3:00 PM Mcarthur Rossetti, MD [facility] None   Primary Care Physician: [name], MD Location: [facility] Facility Note by: Gaspar Cola, MD Date: 03/21/2018; Time: 7:13 AM  Disclaimer:  Medicine is not an Chief Strategy Officer. The only guarantee in medicine is that nothing is guaranteed. It is important to note that the decision to proceed with this intervention was based on the information collected from the patient. The Data and conclusions were drawn from the patient's questionnaire, the interview, and the physical examination. Because the information was provided in large  part by the patient, it cannot be guaranteed that it has not been purposely or unconsciously manipulated. Every effort has been made to obtain as much relevant data as possible for this evaluation. It is important to note that the conclusions that lead to this procedure are derived in large part from the available data. Always take  into account that the treatment will also be dependent on availability of resources and existing treatment guidelines, considered by other Pain Management Practitioners as being common knowledge and practice, at the time of the intervention. For Medico-Legal purposes, it is also important to point out that variation in procedural techniques and pharmacological choices are the acceptable norm. The indications, contraindications, technique, and results of the above procedure should only be interpreted and judged by a Board-Certified Interventional Pain Specialist with extensive familiarity and expertise in the same exact procedure and technique.

## 2018-04-02 ENCOUNTER — Other Ambulatory Visit: Payer: Self-pay | Admitting: *Deleted

## 2018-04-02 NOTE — Patient Outreach (Signed)
[facility]) Care Management  04/02/2018  Brittany Mays 28-Sep-1941 [id number]   CSW made third and final attempt to reach patient without success. CSW was able to leave HIPPA compliant voicemail and had mailed out unsuccessful outreach letter previously. CSW reviewed patient's chart and noted that she is still engaging with [facility] Telephonic RNCM, [place] will inform her that CSW will close out from social work end.    Raynaldo Opitz, LCSW [facility]  Clinical Social Worker cell #: [phone]

## 2018-04-09 ENCOUNTER — Telehealth: Payer: Self-pay

## 2018-04-09 NOTE — Telephone Encounter (Signed)
Do you want to use 11:30 and 12pm on a Thursday?

## 2018-04-09 NOTE — Telephone Encounter (Signed)
Copied from [place] [phone]. Topic: Appointment Scheduling - Scheduling Inquiry for Clinic >> Apr 09, 2018 12:49 PM Aurelio Brash B wrote: Reason for CRM: PT states she needs an apt to see Dr Caryl Bis by June 28  because she has been taking rosuvastatin (CRESTOR) 20 MG tablet and has not seen him since she started taking it, she also  [place] at end of [place] and wants an apt for her husband the same day.

## 2018-04-09 NOTE — Telephone Encounter (Signed)
That's fine

## 2018-04-09 NOTE — Telephone Encounter (Signed)
scheduled

## 2018-04-10 DIAGNOSIS — R1030 Lower abdominal pain, unspecified: Secondary | ICD-10-CM | POA: Diagnosis not present

## 2018-04-10 DIAGNOSIS — K5900 Constipation, unspecified: Secondary | ICD-10-CM | POA: Diagnosis not present

## 2018-04-11 DIAGNOSIS — R1030 Lower abdominal pain, unspecified: Secondary | ICD-10-CM | POA: Diagnosis not present

## 2018-04-15 ENCOUNTER — Other Ambulatory Visit: Payer: Self-pay

## 2018-04-15 NOTE — Patient Outreach (Signed)
[facility]) Care Management  [place]  04/15/2018   Brittany Mays 31-Oct-1941 [id number]  Subjective: Telephone call to patient for monthly call. Patient reports she feels better.  She states that she saw the specialist at [facility] for her stomach and that she is only taking Miralax per his recommendation.  She states she is having bowel movements daily and not much discomfort to her stomach.  Discussed with patient continuing regimen and taking bentyl only as needed.  She verbalized understanding.    Objective:   Encounter Medications:  Outpatient Encounter Medications as of 04/15/2018  Medication Sig Note  . ascorbic acid (VITAMIN C) 500 MG tablet Take 500 mg by mouth daily.   . cyclobenzaprine (FLEXERIL) 5 MG tablet TAKE 1 TABLET BY MOUTH THREE TIMES DAY AS NEEDED FOR MUSCLE SPASMS   . dicyclomine (BENTYL) 20 MG tablet Take 20 mg by mouth every 6 (six) hours as needed for spasms.  01/22/2018: Patient taking TID  . DULoxetine (CYMBALTA) 60 MG capsule TAKE 1 CAPSULE EVERY DAY   . Melatonin 5 MG CAPS Take 5 mg by mouth at bedtime.   . Multiple Vitamin (MULTIVITAMIN) tablet Take 1 tablet by mouth daily.   . Omega-3 Fatty Acids (OMEGA 3 500 PO) Take 500 mg by mouth daily.   Marland Kitchen oxyCODONE (OXY IR/ROXICODONE) 5 MG immediate release tablet Take 1-2 tablets (5-10 mg total) by mouth every 6 (six) hours as needed for moderate pain, severe pain or breakthrough pain.   . Oxycodone HCl 10 MG TABS Take 1 tablet (10 mg total) by mouth every 6 (six) hours as needed.   . polyethylene glycol (MIRALAX / GLYCOLAX) packet Take 17 g by mouth daily. Mix one tablespoon with 8oz of your favorite juice or water every day until you are having soft formed stools. Then start taking once daily if you didn't have a stool the day before. (Patient taking differently: Take 17 g by mouth daily as needed for mild constipation. Mix one tablespoon with 8oz of your favorite juice or water every day until you  are having soft formed stools. Then start taking once daily if you didn't have a stool the day before.)   . primidone (MYSOLINE) 250 MG tablet Take 25 mg by mouth at bedtime.  12/07/2017: Taking 25 mg Daily  . rosuvastatin (CRESTOR) 20 MG tablet Take 1 tablet (20 mg total) by mouth daily.   Marland Kitchen docusate sodium (COLACE) 100 MG capsule Take 200 mg by mouth daily. 04/15/2018: Discontinued  . Probiotic Product (ALIGN EXTRA STRENGTH PO) Take 1 capsule by mouth daily.  04/15/2018: discontinued   No facility-administered encounter medications on file as of 04/15/2018.     Functional Status:  In your present state of health, do you have any difficulty performing the following activities: 12/14/2017 12/03/2017  Hearing? N N  Vision? N N  Difficulty concentrating or making decisions? N N  Walking or climbing stairs? N N  Dressing or bathing? N N  Doing errands, shopping? N N  Preparing Food and eating ? N -  Using the Toilet? N -  In the past six months, have you accidently leaked urine? N -  Do you have problems with loss of bowel control? N -  Managing your Medications? N -  Managing your Finances? N -  Housekeeping or managing your Housekeeping? N -  Some recent data might be hidden    Fall/Depression Screening: Fall Risk  02/05/2018 12/14/2017 11/15/2017  Falls in the past  year? No No No  Number falls in past yr: - - -  Injury with Fall? - - -   PHQ 2/9 Scores 02/05/2018 12/14/2017 11/15/2017 10/08/2017 08/09/2017 05/29/2017 04/24/2017  PHQ - 2 Score 0 1 0 0 0 0 0  PHQ- 9 Score - - - - - - -  Exception Documentation - - - - - - -    Assessment: Patient continues to benefit from care manager outreach for disease management and support.    Plan:  [facility] CM Care Plan Problem One     Most Recent Value  Care Plan Problem One  Recent Hernia Repair  Role Documenting the Problem One  Care Management Telephonic Coordinator  Care Plan for Problem One  Active  [facility] CM Short Term Goal #2   Patient will report less  episodes of constipation within 30 days.  [facility] CM Short Term Goal #2 Start Date  04/15/18  Interventions for Short Term Goal #2  Patient saw specialist at [facility].  Patient only taking miralax for her stomach as recommended by the specialist.  Patient reports some improvement is how her stomach feels.         RN CM will contact patient in the month of July and patient agrees to next outreach.   [name], RN, MSN [facility] Care Management Care Management Coordinator Direct Line [phone] Toll Free: [phone]  Fax: [phone]

## 2018-04-16 ENCOUNTER — Other Ambulatory Visit: Payer: Self-pay

## 2018-04-16 ENCOUNTER — Ambulatory Visit: Payer: Medicare HMO | Attending: Nurse Practitioner | Admitting: Nurse Practitioner

## 2018-04-16 ENCOUNTER — Encounter: Payer: Self-pay | Admitting: Nurse Practitioner

## 2018-04-16 VITALS — BP 130/93 | HR 84 | Temp 98.3°F | Resp 16 | Ht 60.0 in | Wt 110.0 lb

## 2018-04-16 DIAGNOSIS — R911 Solitary pulmonary nodule: Secondary | ICD-10-CM | POA: Diagnosis not present

## 2018-04-16 DIAGNOSIS — Z79899 Other long term (current) drug therapy: Secondary | ICD-10-CM | POA: Insufficient documentation

## 2018-04-16 DIAGNOSIS — M47816 Spondylosis without myelopathy or radiculopathy, lumbar region: Secondary | ICD-10-CM | POA: Diagnosis not present

## 2018-04-16 DIAGNOSIS — M961 Postlaminectomy syndrome, not elsewhere classified: Secondary | ICD-10-CM

## 2018-04-16 DIAGNOSIS — M47896 Other spondylosis, lumbar region: Secondary | ICD-10-CM | POA: Insufficient documentation

## 2018-04-16 DIAGNOSIS — M4326 Fusion of spine, lumbar region: Secondary | ICD-10-CM | POA: Diagnosis not present

## 2018-04-16 DIAGNOSIS — F419 Anxiety disorder, unspecified: Secondary | ICD-10-CM | POA: Insufficient documentation

## 2018-04-16 DIAGNOSIS — D649 Anemia, unspecified: Secondary | ICD-10-CM | POA: Insufficient documentation

## 2018-04-16 DIAGNOSIS — Z5181 Encounter for therapeutic drug level monitoring: Secondary | ICD-10-CM | POA: Diagnosis not present

## 2018-04-16 DIAGNOSIS — G894 Chronic pain syndrome: Secondary | ICD-10-CM

## 2018-04-16 DIAGNOSIS — M7062 Trochanteric bursitis, left hip: Secondary | ICD-10-CM | POA: Diagnosis not present

## 2018-04-16 DIAGNOSIS — Z836 Family history of other diseases of the respiratory system: Secondary | ICD-10-CM | POA: Insufficient documentation

## 2018-04-16 DIAGNOSIS — Z96653 Presence of artificial knee joint, bilateral: Secondary | ICD-10-CM | POA: Diagnosis not present

## 2018-04-16 DIAGNOSIS — M546 Pain in thoracic spine: Secondary | ICD-10-CM | POA: Diagnosis not present

## 2018-04-16 DIAGNOSIS — K5900 Constipation, unspecified: Secondary | ICD-10-CM | POA: Insufficient documentation

## 2018-04-16 DIAGNOSIS — Z886 Allergy status to analgesic agent status: Secondary | ICD-10-CM | POA: Insufficient documentation

## 2018-04-16 DIAGNOSIS — G8918 Other acute postprocedural pain: Secondary | ICD-10-CM | POA: Diagnosis not present

## 2018-04-16 DIAGNOSIS — M19079 Primary osteoarthritis, unspecified ankle and foot: Secondary | ICD-10-CM | POA: Insufficient documentation

## 2018-04-16 DIAGNOSIS — M48061 Spinal stenosis, lumbar region without neurogenic claudication: Secondary | ICD-10-CM | POA: Insufficient documentation

## 2018-04-16 DIAGNOSIS — M47817 Spondylosis without myelopathy or radiculopathy, lumbosacral region: Secondary | ICD-10-CM

## 2018-04-16 DIAGNOSIS — M25552 Pain in left hip: Secondary | ICD-10-CM | POA: Insufficient documentation

## 2018-04-16 DIAGNOSIS — R531 Weakness: Secondary | ICD-10-CM | POA: Insufficient documentation

## 2018-04-16 DIAGNOSIS — G8929 Other chronic pain: Secondary | ICD-10-CM

## 2018-04-16 DIAGNOSIS — Z79891 Long term (current) use of opiate analgesic: Secondary | ICD-10-CM | POA: Insufficient documentation

## 2018-04-16 DIAGNOSIS — Z8249 Family history of ischemic heart disease and other diseases of the circulatory system: Secondary | ICD-10-CM | POA: Insufficient documentation

## 2018-04-16 DIAGNOSIS — M47819 Spondylosis without myelopathy or radiculopathy, site unspecified: Secondary | ICD-10-CM | POA: Insufficient documentation

## 2018-04-16 DIAGNOSIS — E785 Hyperlipidemia, unspecified: Secondary | ICD-10-CM | POA: Insufficient documentation

## 2018-04-16 DIAGNOSIS — Z833 Family history of diabetes mellitus: Secondary | ICD-10-CM | POA: Insufficient documentation

## 2018-04-16 DIAGNOSIS — M549 Dorsalgia, unspecified: Secondary | ICD-10-CM

## 2018-04-16 DIAGNOSIS — F17210 Nicotine dependence, cigarettes, uncomplicated: Secondary | ICD-10-CM | POA: Insufficient documentation

## 2018-04-16 DIAGNOSIS — Z981 Arthrodesis status: Secondary | ICD-10-CM | POA: Insufficient documentation

## 2018-04-16 DIAGNOSIS — Z818 Family history of other mental and behavioral disorders: Secondary | ICD-10-CM | POA: Insufficient documentation

## 2018-04-16 DIAGNOSIS — M960 Pseudarthrosis after fusion or arthrodesis: Secondary | ICD-10-CM | POA: Diagnosis not present

## 2018-04-16 DIAGNOSIS — Z96642 Presence of left artificial hip joint: Secondary | ICD-10-CM | POA: Insufficient documentation

## 2018-04-16 DIAGNOSIS — Z885 Allergy status to narcotic agent status: Secondary | ICD-10-CM | POA: Insufficient documentation

## 2018-04-16 MED ORDER — OXYCODONE HCL 10 MG PO TABS: 10.0000 mg | ORAL_TABLET | Freq: Four times a day (QID) | ORAL | 0 refills | Status: AC | PRN

## 2018-04-16 MED ORDER — OXYCODONE HCL 10 MG PO TABS
10.0000 mg | ORAL_TABLET | Freq: Four times a day (QID) | ORAL | 0 refills | Status: AC | PRN
Start: 2018-05-06 — End: 2018-06-05

## 2018-04-16 NOTE — Progress Notes (Signed)
Nursing Pain Medication Assessment:  Safety precautions to be maintained throughout the outpatient stay will include: orient to surroundings, keep bed in low position, maintain call bell within reach at all times, provide assistance with transfer out of bed and ambulation.  Medication Inspection Compliance: Pill count conducted under aseptic conditions, in front of the patient. Neither the pills nor the bottle was removed from the patient's sight at any time. Once count was completed pills were immediately returned to the patient in their original bottle.  Medication: Oxycodone IR Pill/Patch Count: 78 of 120 pills remain Pill/Patch Appearance: Markings consistent with prescribed medication Bottle Appearance: Standard pharmacy container. Clearly labeled. Filled Date: 06 / 01 / 2018 Last Medication intake:  Today

## 2018-04-16 NOTE — Patient Instructions (Addendum)
___You have been given 3 scripts today for oxycodone.  You have been given pre procedure instructions with sedation.  You have been instructed to get xrays in the medical mall as soon as possible. _________________________________________________________________________________________  Medication Rules  Applies to: All patients receiving prescriptions (written or electronic).  Pharmacy of record: Pharmacy where electronic prescriptions will be sent. If written prescriptions are taken to a different pharmacy, please inform the nursing staff. The pharmacy listed in the electronic medical record should be the one where you would like electronic prescriptions to be sent.  Prescription refills: Only during scheduled appointments. Applies to both, written and electronic prescriptions.  NOTE: The following applies primarily to controlled substances (Opioid* Pain Medications).   Patient's responsibilities: 1. Pain Pills: Bring all pain pills to every appointment (except for procedure appointments). 2. Pill Bottles: Bring pills in original pharmacy bottle. Always bring newest bottle. Bring bottle, even if empty. 3. Medication refills: You are responsible for knowing and keeping track of what medications you need refilled. The day before your appointment, write a list of all prescriptions that need to be refilled. Bring that list to your appointment and give it to the admitting nurse. Prescriptions will be written only during appointments. If you forget a medication, it will not be "Called in", "Faxed", or "electronically sent". You will need to get another appointment to get these prescribed. 4. Prescription Accuracy: You are responsible for carefully inspecting your prescriptions before leaving our office. Have the discharge nurse carefully go over each prescription with you, before taking them home. Make sure that your name is accurately spelled, that your address is correct. Check the name and dose of  your medication to make sure it is accurate. Check the number of pills, and the written instructions to make sure they are clear and accurate. Make sure that you are given enough medication to last until your next medication refill appointment. 5. Taking Medication: Take medication as prescribed. Never take more pills than instructed. Never take medication more frequently than prescribed. Taking less pills or less frequently is permitted and encouraged, when it comes to controlled substances (written prescriptions).  6. Inform other Doctors: Always inform, all of your healthcare providers, of all the medications you take. 7. Pain Medication from other Providers: You are not allowed to accept any additional pain medication from any other Doctor or Healthcare provider. There are two exceptions to this rule. (see below) In the event that you require additional pain medication, you are responsible for notifying us, as stated below. 8. Medication Agreement: You are responsible for carefully reading and following our Medication Agreement. This must be signed before receiving any prescriptions from our practice. Safely store a copy of your signed Agreement. Violations to the Agreement will result in no further prescriptions. (Additional copies of our Medication Agreement are available upon request.) 9. Laws, Rules, & Regulations: All patients are expected to follow all Federal and [employer], Rules, [employer]. Ignorance of the Laws does not constitute a valid excuse. The use of any illegal substances is prohibited. 10. Adopted CDC guidelines & recommendations: Target dosing levels will be at or below 60 MME/day. Use of benzodiazepines** is not recommended.  Exceptions: There are only two exceptions to the rule of not receiving pain medications from other Healthcare Providers. 1. Exception #1 (Emergencies): In the event of an emergency (i.e.: accident requiring emergency care), you are allowed to  receive additional pain medication. However, you are responsible for: As soon as you are able,  call our office [phone], at any time of the day or night, and leave a message stating your name, the date and nature of the emergency, and the name and dose of the medication prescribed. In the event that your call is answered by a member of our staff, make sure to document and save the date, time, and the name of the person that took your information.  2. Exception #2 (Planned Surgery): In the event that you are scheduled by another doctor or dentist to have any type of surgery or procedure, you are allowed (for a period no longer than 30 days), to receive additional pain medication, for the acute post-op pain. However, in this case, you are responsible for picking up a copy of our "Post-op Pain Management for Surgeons" handout, and giving it to your surgeon or dentist. This document is available at our office, and does not require an appointment to obtain it. Simply go to our office during business hours (Monday-Thursday from 8:00 AM to 4:00 PM) (Friday 8:00 AM to 12:00 Noon) or if you have a scheduled appointment with [place], prior to your surgery, and ask for it by name. In addition, you will need to provide [place] with your name, name of your surgeon, type of surgery, and date of procedure or surgery.  *Opioid medications include: morphine, codeine, oxycodone, oxymorphone, hydrocodone, hydromorphone, meperidine, tramadol, tapentadol, buprenorphine, fentanyl, methadone. **Benzodiazepine medications include: diazepam (Valium), alprazolam (Xanax), clonazepam (Klonopine), lorazepam (Ativan), clorazepate (Tranxene), chlordiazepoxide (Librium), estazolam (Prosom), oxazepam (Serax), temazepam (Restoril), triazolam (Halcion) (Last updated: 01/03/2018) ____________________________________________________________________________________________   Preparing for Procedure with Sedation Instructions: . Oral Intake: Do  not eat or drink anything for at least 8 hours prior to your procedure. . Transportation: Public transportation is not allowed. Bring an adult driver. The driver must be physically present in our waiting room before any procedure can be started. Marland Kitchen Physical Assistance: Bring an adult capable of physically assisting you, in the event you need help. . Blood Pressure Medicine: Take your blood pressure medicine with a sip of water the morning of the procedure. . Insulin: Take only  of your normal insulin dose. . Preventing infections: Shower with an antibacterial soap the morning of your procedure. . Build-up your immune system: Take 1000 mg of Vitamin C with every meal (3 times a day) the day prior to your procedure. . Pregnancy: If you are pregnant, call and cancel the procedure. . Sickness: If you have a cold, fever, or any active infections, call and cancel the procedure. . Arrival: You must be in the facility at least 30 minutes prior to your scheduled procedure. . Children: Do not bring children with you. . Dress appropriately: Bring dark clothing that you would not mind if they get stained. . Valuables: Do not bring any jewelry or valuables. Procedure appointments are reserved for interventional treatments only. Marland Kitchen No Prescription Refills. . No medication changes will be discussed during procedure appointments. . No disability issues will be discussed.

## 2018-04-16 NOTE — Progress Notes (Signed)
Patient's Name: Brittany Mays  MRN: [id number]  Referring Provider: [name], MD  DOB: [birth year]  PCP: [name], MD  DOS: 04/16/2018  Note by: Vevelyn Francois NP  Service setting: Ambulatory outpatient  Specialty: Interventional Pain Management  Location: [facility] (AMB) Pain Management Facility    Patient type: Established    Primary Reason(s) for Visit: Encounter for prescription drug management. (Level of risk: moderate)  CC: Back Pain (mid)  HPI  Brittany Mays is a 77 y.o. year old, female patient, who comes today for a medication management evaluation. She has Anxiety and depression; Obesity (BMI 30-39.9); Lumbar spinal stenosis; History of lumbar fusion (T10 through L3 fusion); Generalized weakness; Mechanical complication of internal fixation device such as nail, plate or rod ([place]); Pseudarthrosis after fusion or arthrodesis; Pulmonary nodule; Chronic hip pain (Location of Secondary source of pain) (Left); Hyperlipidemia; Aortic aneurysm ([place]); Osteoarthritis, multiple sites; Chronic low back pain (Location of Primary Source of Pain) (Bilateral) (R>L); Failed back surgical syndrome (x4); Chronic knee pain (Bilateral) (L>R); Long term current use of opiate analgesic; Long term prescription opiate use; Opiate use (60 MME/Day); Encounter for pain management planning; Encounter for therapeutic drug level monitoring; Abnormal CT scan, lumbar spine (05/07/2015); Osteoarthritis of sacroiliac joint (Bilateral); Lumbar facet arthropathy ([place]); Lumbar spondylolisthesis; Lumbar foraminal stenosis (Bilateral); History of total bilateral knee replacement; Opioid-induced constipation (OIC); Chronic groin pain (Location of Tertiary source of pain) (Left); Postlaminectomy syndrome of lumbar region; Lumbar facet syndrome (Bilateral) (R>L); Pseudarthrosis following spinal fusion; Lumbar spondylosis; Chronic upper back pain (midline); Chronic pain syndrome; Tremor; Musculoskeletal pain; Acute  postoperative pain; Osteoarthritis of hip (Left); Osteoarthritis of ankle or foot; Trochanteric bursitis of left hip; Osteoarthritis of hip  (Bilateral) (L>R); Status post total replacement of left hip; Anemia; Chronic abdominal pain; Bochdalek hernia; S/P left inguinal hernia repair; and Spondylosis without myelopathy or radiculopathy, lumbosacral region on their problem list. Her primarily concern today is the Back Pain (mid)  Pain Assessment: Location: Mid, Lower, Right Back Radiating: mid to lower back Onset: More than a month ago Duration: Chronic pain Quality: Aching, Sharp, Constant Severity: 5 /10 (subjective, self-reported pain score)  Note: Reported level is compatible with observation.                          Effect on ADL: lifitng, bending, twisting, prolonged walking Modifying factors: medication, rest BP: (!) 130/93  HR: 84  Brittany Mays was last scheduled for an appointment on 02/05/2018 for medication management. During today's appointment we reviewed Brittany Mays's chronic pain status, as well as her outpatient medication regimen. She admits that she is SP left THR and inguinal hernia repair. She missed her Left Lumbar Facet RFA. She admits that she is having right middle back pain and lower back pain. She is SP several spinal surgeries .  The patient  reports that she does not use drugs. Her body mass index is 21.48 kg/m.  Further details on both, my assessment(s), as well as the proposed treatment plan, please see below.  Controlled Substance Pharmacotherapy Assessment REMS (Risk Evaluation and Mitigation Strategy)  Analgesic:Oxycodone IR 10 mg every 6 hours (40 mg/dayof oxycodone) MME/day:14m/day   GIgnatius Specking RN  04/16/2018  1:41 PM  Sign at close encounter Nursing Pain Medication Assessment:  Safety precautions to be maintained throughout the outpatient stay will include: orient to surroundings, keep bed in low position, maintain call bell within reach at  all times, provide assistance  with transfer out of bed and ambulation.  Medication Inspection Compliance: Pill count conducted under aseptic conditions, in front of the patient. Neither the pills nor the bottle was removed from the patient's sight at any time. Once count was completed pills were immediately returned to the patient in their original bottle.  Medication: Oxycodone IR Pill/Patch Count: 78 of 120 pills remain Pill/Patch Appearance: Markings consistent with prescribed medication Bottle Appearance: Standard pharmacy container. Clearly labeled. Filled Date: 06 / 01 / 2018 Last Medication intake:  Today   Pharmacokinetics: Liberation and absorption (onset of action): WNL Distribution (time to peak effect): WNL Metabolism and excretion (duration of action): WNL         Pharmacodynamics: Desired effects: Analgesia: Brittany Mays reports >50% benefit. Functional ability: Patient reports that medication allows her to accomplish basic ADLs Clinically meaningful improvement in function (CMIF): Sustained CMIF goals met Perceived effectiveness: Described as relatively effective, allowing for increase in activities of daily living (ADL) Undesirable effects: Side-effects or Adverse reactions: None reported Monitoring: [place] PMP: Online review of the past 20-monthperiod conducted. Compliant with practice rules and regulations Last UDS on record: Summary  Date Value Ref Range Status  02/05/2018 FINAL  Final    Comment:    ==================================================================== TOXASSURE SELECT 13 (MW) ==================================================================== Test                             Result       Flag       Units Drug Present and Declared for Prescription Verification   Oxycodone                      1256         EXPECTED   ng/mg creat   Oxymorphone                    2316         EXPECTED   ng/mg creat   Noroxycodone                   6882         EXPECTED    ng/mg creat   Noroxymorphone                 1521         EXPECTED   ng/mg creat    Sources of oxycodone are scheduled prescription medications.    Oxymorphone, noroxycodone, and noroxymorphone are expected    metabolites of oxycodone. Oxymorphone is also available as a    scheduled prescription medication.   Phenobarbital                  PRESENT      EXPECTED    Phenobarbital is an expected metabolite of primidone;    Phenobarbital may also be administered as a prescription drug. ==================================================================== Test                      Result    Flag   Units      Ref Range   Creatinine              87               mg/dL      >=20 ==================================================================== Declared Medications:  The flagging and interpretation on this report are based on the  following declared medications.  Unexpected results may arise  from  inaccuracies in the declared medications.  **Note: The testing scope of this panel includes these medications:  Oxycodone  Primidone  **Note: The testing scope of this panel does not include following  reported medications:  Cyclobenzaprine  Dicyclomine  Docusate  Duloxetine  Melatonin  Multivitamin  Omega-3 Fatty Acids  Polyethylene Glycol  Rosuvastatin  Supplement (Probiotic) ==================================================================== For clinical consultation, please call [phone]. ====================================================================    UDS interpretation: Compliant          Medication Assessment Form: Reviewed. Patient indicates being compliant with therapy Treatment compliance: Compliant Risk Assessment Profile: Aberrant behavior: See prior evaluations. None observed or detected today Comorbid factors increasing risk of overdose: See prior notes. No additional risks detected today Risk of substance use disorder (SUD): Low Opioid Risk Tool - 04/16/18  1339      Personal History of Substance Abuse   Alcohol  Negative    Illegal Drugs  Negative    Rx Drugs  Negative      Age   Age between 52-45 years   No      Psychological Disease   Psychological Disease  Positive    ADD  Negative    OCD  Negative    Schizophrenia  Negative    Depression  Positive Stress   Stress     Total Score   Opioid Risk Tool Scoring  3    Opioid Risk Interpretation  Low Risk      ORT Scoring interpretation table:  Score <3 = Low Risk for SUD  Score between 4-7 = Moderate Risk for SUD  Score >8 = High Risk for Opioid Abuse   Risk Mitigation Strategies:  Patient Counseling: Covered Patient-Prescriber Agreement (PPA): Present and active  Notification to other healthcare providers: Done  Pharmacologic Plan: No change in therapy, at this time.             Laboratory Chemistry  Inflammation Markers (CRP: Acute Phase) (ESR: Chronic Phase) Lab Results  Component Value Date   CRP 0.6 07/12/2016   ESRSEDRATE 6 07/12/2016                         Rheumatology Markers No results found for: RF, ANA, LABURIC, URICUR, LYMEIGGIGMAB, LYMEABIGMQN, HLAB27                      Renal Function Markers Lab Results  Component Value Date   BUN 14 11/27/2017   CREATININE 1.10 (H) 02/25/2018   BCR 15 08/18/2015   GFRAA >60 11/27/2017   GFRNONAA >60 11/27/2017                             Hepatic Function Markers Lab Results  Component Value Date   AST 20 11/08/2017   ALT 11 11/08/2017   ALBUMIN 4.2 11/08/2017   ALKPHOS 81 11/08/2017   HCVAB NEGATIVE 09/07/2015   LIPASE 36 06/25/2017                        Electrolytes Lab Results  Component Value Date   NA 139 11/27/2017   K 3.9 11/27/2017   CL 102 11/27/2017   CALCIUM 9.3 11/27/2017   MG 2.0 07/12/2016                        Neuropathy Markers Lab Results  Component Value Date  VITAMINB12 498 10/01/2017   FOLATE 20.6 10/01/2017   HGBA1C 5.9 (H) 07/27/2015   HIV NONREACTIVE 09/07/2015                         Bone Pathology Markers Lab Results  Component Value Date   25OHVITD1 35 07/12/2016   25OHVITD2 <1.0 07/12/2016   25OHVITD3 35 07/12/2016                         Coagulation Parameters Lab Results  Component Value Date   PLT 203 11/27/2017                        Cardiovascular Markers Lab Results  Component Value Date   TROPONINI <0.03 06/25/2017   HGB 13.5 11/27/2017   HCT 40.9 11/27/2017                         CA Markers No results found for: CEA, CA125, LABCA2                      Note: Lab results reviewed.  Recent Diagnostic Imaging Results   Complexity Note: Imaging results reviewed. Results shared with Ms. Slutsky, using Layman's terms.                         Meds   Current Outpatient Medications:  .  cyclobenzaprine (FLEXERIL) 5 MG tablet, TAKE 1 TABLET BY MOUTH THREE TIMES DAY AS NEEDED FOR MUSCLE SPASMS, Disp: , Rfl: 0 .  dicyclomine (BENTYL) 20 MG tablet, Take 20 mg by mouth every 6 (six) hours as needed for spasms. , Disp: , Rfl:  .  dicyclomine (BENTYL) 20 MG tablet, Take by mouth., Disp: , Rfl:  .  DULoxetine (CYMBALTA) 60 MG capsule, TAKE 1 CAPSULE EVERY DAY, Disp: 90 capsule, Rfl: 3 .  Melatonin 5 MG CAPS, Take 5 mg by mouth at bedtime., Disp: , Rfl:  .  Multiple Vitamin (MULTIVITAMIN) tablet, Take 1 tablet by mouth daily., Disp: , Rfl:  .  Omega-3 Fatty Acids (OMEGA 3 500 PO), Take 500 mg by mouth daily., Disp: , Rfl:  .  rosuvastatin (CRESTOR) 20 MG tablet, Take 1 tablet (20 mg total) by mouth daily., Disp: 90 tablet, Rfl: 3 .  [START ON 05/06/2018] Oxycodone HCl 10 MG TABS, Take 1 tablet (10 mg total) by mouth every 6 (six) hours as needed., Disp: 120 tablet, Rfl: 0 .  [START ON 07/05/2018] Oxycodone HCl 10 MG TABS, Take 1 tablet (10 mg total) by mouth every 6 (six) hours as needed., Disp: 120 tablet, Rfl: 0 .  [START ON 06/05/2018] Oxycodone HCl 10 MG TABS, Take 1 tablet (10 mg total) by mouth every 6 (six) hours as needed.,  Disp: 120 tablet, Rfl: 0  ROS  Constitutional: Denies any fever or chills Gastrointestinal: No reported hemesis, hematochezia, vomiting, or acute GI distress Musculoskeletal: Denies any acute onset joint swelling, redness, loss of ROM, or weakness Neurological: No reported episodes of acute onset apraxia, aphasia, dysarthria, agnosia, amnesia, paralysis, loss of coordination, or loss of consciousness  Allergies  Brittany Mays is allergic to fentanyl; nsaids; and pentothal [thiopental].  PFSH  Drug: Brittany Mays  reports that she does not use drugs. Alcohol:  reports that she does not drink alcohol. Tobacco:  reports that she has been smoking cigarettes.  She has  a 25.00 pack-year smoking history. She has never used smokeless tobacco. Medical:  has a past medical history of Adverse effect of anesthetic, Anxiety, Aortic aneurysm ([place]) (06/06/2016), Cataract, Chest pain (18/56/3149), Complication of anesthesia, Constipation due to opioid therapy, Decreased muscle strength (02/10/2014), Depression, Difficulty in walking(719.7) (02/10/2014), Dysphagia (08/19/2015), Dyspnea (08/19/2015), Dysrhythmia, GERD (gastroesophageal reflux disease), History of kidney stones, Hyperparathyroidism, primary ([place]), Hyperventilation (08/18/2015), Insomnia, Intractable back pain (04/29/2014), Kidney stone, Lumbar spinal stenosis, Odynophagia (08/18/2015), Osteoarthritis of both knees, Pain, Rectal prolapse, Unintentional weight loss (09/08/2015), and UTI (lower urinary tract infection) (04/28/2014). Surgical: Brittany Mays  has a past surgical history that includes Abdominal hysterectomy; Spine surgery; Parathyroidectomy (1990's); Appendectomy; Breast lumpectomy; Tonsillectomy; Back surgery (07-2015); Total hip arthroplasty (Left, 09/18/2017); Total knee arthroplasty (Bilateral, 1990's nd 2002); Colonoscopy (2013); Total hip arthroplasty (Left, 09/18/2017); Rotator cuff repair; Inguinal hernia repair (Left, 12/03/2017); Joint replacement;  and Colonoscopy with propofol (N/A, 03/11/2018). Family: family history includes COPD in her father; Diabetes in her son; Heart failure in her father; Mental illness in her mother.  Constitutional Exam  General appearance: Well nourished, well developed, and well hydrated. In no apparent acute distress Vitals:   04/16/18 1331  BP: (!) 130/93  Pulse: 84  Resp: 16  Temp: 98.3 F (36.8 C)  SpO2: 98%  Weight: 110 lb (49.9 kg)  Height: 5' (1.524 m)  Psych/Mental status: Alert, oriented x 3 (person, place, & time)       Eyes: PERLA Respiratory: No evidence of acute respiratory distress   Thoracic Spine Area Exam  Skin & Axial Inspection: Well healed scar from previous spine surgery detected Alignment: Symmetrical Functional ROM: Unrestricted ROM Stability: No instability detected Muscle Tone/Strength: Functionally intact. No obvious neuro-muscular anomalies detected. Sensory (Neurological): Unimpaired Muscle strength & Tone: Complains of area being tender to palpation on the right  Lumbar Spine Area Exam  Skin & Axial Inspection: Well healed scar from previous spine surgery detected Alignment: Symmetrical Functional ROM: Unrestricted ROM       Stability: No instability detected Muscle Tone/Strength: Functionally intact. No obvious neuro-muscular anomalies detected. Sensory (Neurological): Unimpaired Palpation: Complains of area being tender to palpation       Provocative Tests: Lumbar Hyperextension/rotation test: deferred today       Lumbar quadrant test (Kemp's test): deferred today       Lumbar Lateral bending test: deferred today       Patrick's Maneuver: deferred today                   FABER test: deferred today       Thigh-thrust test: deferred today       S-I compression test: deferred today       S-I distraction test: deferred today        Gait & Posture Assessment  Ambulation: Unassisted Gait: Relatively normal for age and body habitus Posture: WNL   Lower  Extremity Exam    Side: Right lower extremity  Side: Left lower extremity  Stability: No instability observed          Stability: No instability observed          Skin & Extremity Inspection: Skin color, temperature, and hair growth are WNL. No peripheral edema or cyanosis. No masses, redness, swelling, asymmetry, or associated skin lesions. No contractures.  Skin & Extremity Inspection: Skin color, temperature, and hair growth are WNL. No peripheral edema or cyanosis. No masses, redness, swelling, asymmetry, or associated skin lesions. No contractures.  Functional ROM: Unrestricted ROM  Functional ROM: Unrestricted ROM                  Muscle Tone/Strength: Functionally intact. No obvious neuro-muscular anomalies detected.  Muscle Tone/Strength: Functionally intact. No obvious neuro-muscular anomalies detected.  Sensory (Neurological): Unimpaired  Sensory (Neurological): Unimpaired  Palpation: No palpable anomalies  Palpation: No palpable anomalies   Assessment  Primary Diagnosis & Pertinent Problem List: The primary encounter diagnosis was Chronic upper back pain (midline). Diagnoses of Lumbar spondylosis, Spondylosis without myelopathy or radiculopathy, lumbosacral region, Failed back surgical syndrome (x4), and Chronic pain syndrome were also pertinent to this visit.  Status Diagnosis  Worsening Persistent Controlled 1. Chronic upper back pain (midline)   2. Lumbar spondylosis   3. Spondylosis without myelopathy or radiculopathy, lumbosacral region   4. Failed back surgical syndrome (x4)   5. Chronic pain syndrome     Problems updated and reviewed during this visit: No problems updated. Plan of Care  Pharmacotherapy (Medications Ordered): Meds ordered this encounter  Medications  . Oxycodone HCl 10 MG TABS    Sig: Take 1 tablet (10 mg total) by mouth every 6 (six) hours as needed.    Dispense:  120 tablet    Refill:  0    Fill one day early if pharmacy is closed  on scheduled refill date. Do not fill until: 05/06/2018 To last until: 06/05/2018    Order Specific Question:   Supervising Provider    Answer:   Milinda Pointer [phone]  . Oxycodone HCl 10 MG TABS    Sig: Take 1 tablet (10 mg total) by mouth every 6 (six) hours as needed.    Dispense:  120 tablet    Refill:  0    Fill one day early if pharmacy is closed on scheduled refill date. Do not fill until: 07/05/2018 To last until: 08/04/2018    Order Specific Question:   Supervising Provider    Answer:   Milinda Pointer [phone]  . Oxycodone HCl 10 MG TABS    Sig: Take 1 tablet (10 mg total) by mouth every 6 (six) hours as needed.    Dispense:  120 tablet    Refill:  0    DO NOT DELETE, even if Expired!!! Fill one day early if pharmacy is closed on scheduled refill date. Do not fill until: 06/05/2018 To last until: 07/05/2018    Order Specific Question:   Supervising Provider    Answer:   Milinda Pointer [phone]   New Prescriptions   No medications on file   Medications administered today: Verner L. Calvo had no medications administered during this visit. Lab-work, procedure(s), and/or referral(s): Orders Placed This Encounter  Procedures  . THORACIC FACET BLOCK  . DG Lumbar Spine Complete W/Bend  . DG Thoracic Spine 2 View   Imaging and/or referral(s): DG LUMBAR SPINE COMPLETE W/BEND 6+V DG THORACIC SPINE 2 VIEW Interventional therapies: Planned, scheduled, and/or pending: Left lumbar facet radiofrequency ablation   Considering: Diagnostic bilateral lumbar facet block #2under fluoro and sedation. Diagnostic bilateral thoracolumbar facet block. Possible bilateral thoracolumbar facet radiofrequencyablation.  Diagnostic left intra-articular hip joint injection. Diagnostic left femoral and obturator nerve articular branch blocks.  Possible left femoral and obturator nerve articular branch radiofrequencyablation.  Diagnostic bilateral genicular nerve blocks.  Possible  bilateral genicular nerve radiofrequencyablation.  Diagnosticintrathecal pump trial.   Palliative PRN treatment(s): Palliative bilateral lumbar facet block       Provider-requested follow-up: Return in about 3 months (around 07/17/2018) for MedMgmt with Me Donella Stade Edison Pace),  in addition, Procedure(w/Sedation), w/ Dr. Dossie Arbour, ([facility]).  Future Appointments  Date Time Provider [place]  04/18/2018 11:30 AM [name], MD [facility]  04/29/2018  3:00 PM Mcarthur Rossetti, MD [facility] None  05/08/2018  2:45 PM [name], MD [facility]  05/13/2018 11:00 AM Jon Billings, RN [facility] None  07/17/2018 12:45 PM Vevelyn Francois, NP [facility] None   Primary Care Physician: [name], MD Location: [facility] Note by: Vevelyn Francois NP Date: 04/16/2018; Time: 3:04 PM  Pain Score Disclaimer: We use the NRS-11 scale. This is a self-reported, subjective measurement of pain severity with only modest accuracy. It is used primarily to identify changes within a particular patient. It must be understood that outpatient pain scales are significantly less accurate that those used for research, where they can be applied under ideal controlled circumstances with minimal exposure to variables. In reality, the score is likely to be a combination of pain intensity and pain affect, where pain affect describes the degree of emotional arousal or changes in action readiness caused by the sensory experience of pain. Factors such as social and work situation, setting, emotional state, anxiety levels, expectation, and prior pain experience may influence pain perception and show large inter-individual differences that may also be affected by time variables.  Patient instructions provided during this appointment: Patient Instructions  ___You have been given 3 scripts today for oxycodone.  You have been given pre procedure instructions with sedation.   You have been instructed to get xrays in the medical mall as soon as possible. _________________________________________________________________________________________  Medication Rules  Applies to: All patients receiving prescriptions (written or electronic).  Pharmacy of record: Pharmacy where electronic prescriptions will be sent. If written prescriptions are taken to a different pharmacy, please inform the nursing staff. The pharmacy listed in the electronic medical record should be the one where you would like electronic prescriptions to be sent.  Prescription refills: Only during scheduled appointments. Applies to both, written and electronic prescriptions.  NOTE: The following applies primarily to controlled substances (Opioid* Pain Medications).   Patient's responsibilities: 1. Pain Pills: Bring all pain pills to every appointment (except for procedure appointments). 2. Pill Bottles: Bring pills in original pharmacy bottle. Always bring newest bottle. Bring bottle, even if empty. 3. Medication refills: You are responsible for knowing and keeping track of what medications you need refilled. The day before your appointment, write a list of all prescriptions that need to be refilled. Bring that list to your appointment and give it to the admitting nurse. Prescriptions will be written only during appointments. If you forget a medication, it will not be "Called in", "Faxed", or "electronically sent". You will need to get another appointment to get these prescribed. 4. Prescription Accuracy: You are responsible for carefully inspecting your prescriptions before leaving our office. Have the discharge nurse carefully go over each prescription with you, before taking them home. Make sure that your name is accurately spelled, that your address is correct. Check the name and dose of your medication to make sure it is accurate. Check the number of pills, and the written instructions to make sure they  are clear and accurate. Make sure that you are given enough medication to last until your next medication refill appointment. 5. Taking Medication: Take medication as prescribed. Never take more pills than instructed. Never take medication more frequently than prescribed. Taking less pills or less frequently is permitted and encouraged, when it comes to  controlled substances (written prescriptions).  6. Inform other Doctors: Always inform, all of your healthcare providers, of all the medications you take. 7. Pain Medication from other Providers: You are not allowed to accept any additional pain medication from any other Doctor or Healthcare provider. There are two exceptions to this rule. (see below) In the event that you require additional pain medication, you are responsible for notifying us, as stated below. 8. Medication Agreement: You are responsible for carefully reading and following our Medication Agreement. This must be signed before receiving any prescriptions from our practice. Safely store a copy of your signed Agreement. Violations to the Agreement will result in no further prescriptions. (Additional copies of our Medication Agreement are available upon request.) 9. Laws, Rules, & Regulations: All patients are expected to follow all Federal and [employer], [employer], Rules, [employer]. Ignorance of the Laws does not constitute a valid excuse. The use of any illegal substances is prohibited. 10. Adopted CDC guidelines & recommendations: Target dosing levels will be at or below 60 MME/day. Use of benzodiazepines** is not recommended.  Exceptions: There are only two exceptions to the rule of not receiving pain medications from other Healthcare Providers. 1. Exception #1 (Emergencies): In the event of an emergency (i.e.: accident requiring emergency care), you are allowed to receive additional pain medication. However, you are responsible for: As soon as you are able, call our office [phone]  [phone], at any time of the day or night, and leave a message stating your name, the date and nature of the emergency, and the name and dose of the medication prescribed. In the event that your call is answered by a member of our staff, make sure to document and save the date, time, and the name of the person that took your information.  2. Exception #2 (Planned Surgery): In the event that you are scheduled by another doctor or dentist to have any type of surgery or procedure, you are allowed (for a period no longer than 30 days), to receive additional pain medication, for the acute post-op pain. However, in this case, you are responsible for picking up a copy of our "Post-op Pain Management for Surgeons" handout, and giving it to your surgeon or dentist. This document is available at our office, and does not require an appointment to obtain it. Simply go to our office during business hours (Monday-Thursday from 8:00 AM to 4:00 PM) (Friday 8:00 AM to 12:00 Noon) or if you have a scheduled appointment with [place], prior to your surgery, and ask for it by name. In addition, you will need to provide [place] with your name, name of your surgeon, type of surgery, and date of procedure or surgery.  *Opioid medications include: morphine, codeine, oxycodone, oxymorphone, hydrocodone, hydromorphone, meperidine, tramadol, tapentadol, buprenorphine, fentanyl, methadone. **Benzodiazepine medications include: diazepam (Valium), alprazolam (Xanax), clonazepam (Klonopine), lorazepam (Ativan), clorazepate (Tranxene), chlordiazepoxide (Librium), estazolam (Prosom), oxazepam (Serax), temazepam (Restoril), triazolam (Halcion) (Last updated: 01/03/2018) ____________________________________________________________________________________________   Preparing for Procedure with Sedation Instructions: . Oral Intake: Do not eat or drink anything for at least 8 hours prior to your procedure. . Transportation: Public transportation is not  allowed. Bring an adult driver. The driver must be physically present in our waiting room before any procedure can be started. Marland Kitchen Physical Assistance: Bring an adult capable of physically assisting you, in the event you need help. . Blood Pressure Medicine: Take your blood pressure medicine with a sip of water the morning of the procedure. . Insulin: Take only  of your normal insulin dose. . Preventing infections: Shower with an antibacterial soap the morning of your procedure. . Build-up your immune system: Take 1000 mg of Vitamin C with every meal (3 times a day) the day prior to your procedure. . Pregnancy: If you are pregnant, call and cancel the procedure. . Sickness: If you have a cold, fever, or any active infections, call and cancel the procedure. . Arrival: You must be in the facility at least 30 minutes prior to your scheduled procedure. . Children: Do not bring children with you. . Dress appropriately: Bring dark clothing that you would not mind if they get stained. . Valuables: Do not bring any jewelry or valuables. Procedure appointments are reserved for interventional treatments only. Marland Kitchen No Prescription Refills. . No medication changes will be discussed during procedure appointments. . No disability issues will be discussed.

## 2018-04-18 ENCOUNTER — Other Ambulatory Visit: Payer: Self-pay | Admitting: Nurse Practitioner

## 2018-04-18 ENCOUNTER — Ambulatory Visit
Admission: RE | Admit: 2018-04-18 | Discharge: 2018-04-18 | Disposition: A | Discharge status: Home / Self Care | Payer: Medicare HMO | Source: Ambulatory Visit | Attending: Nurse Practitioner | Admitting: Nurse Practitioner

## 2018-04-18 ENCOUNTER — Encounter: Payer: Self-pay | Admitting: Nurse Practitioner

## 2018-04-18 ENCOUNTER — Encounter: Payer: Self-pay | Admitting: Family Medicine

## 2018-04-18 ENCOUNTER — Ambulatory Visit (INDEPENDENT_AMBULATORY_CARE_PROVIDER_SITE_OTHER): Payer: Medicare HMO | Admitting: Family Medicine

## 2018-04-18 VITALS — BP 110/72 | HR 91 | Temp 98.2°F | Ht 60.0 in | Wt 111.0 lb

## 2018-04-18 DIAGNOSIS — Z9689 Presence of other specified functional implants: Secondary | ICD-10-CM | POA: Insufficient documentation

## 2018-04-18 DIAGNOSIS — I712 Thoracic aortic aneurysm, without rupture, unspecified: Secondary | ICD-10-CM

## 2018-04-18 DIAGNOSIS — M549 Dorsalgia, unspecified: Secondary | ICD-10-CM | POA: Diagnosis not present

## 2018-04-18 DIAGNOSIS — F329 Major depressive disorder, single episode, unspecified: Secondary | ICD-10-CM

## 2018-04-18 DIAGNOSIS — M47816 Spondylosis without myelopathy or radiculopathy, lumbar region: Secondary | ICD-10-CM | POA: Insufficient documentation

## 2018-04-18 DIAGNOSIS — M4314 Spondylolisthesis, thoracic region: Secondary | ICD-10-CM | POA: Insufficient documentation

## 2018-04-18 DIAGNOSIS — E781 Pure hyperglyceridemia: Secondary | ICD-10-CM | POA: Diagnosis not present

## 2018-04-18 DIAGNOSIS — M47894 Other spondylosis, thoracic region: Secondary | ICD-10-CM | POA: Insufficient documentation

## 2018-04-18 DIAGNOSIS — M47814 Spondylosis without myelopathy or radiculopathy, thoracic region: Secondary | ICD-10-CM | POA: Diagnosis not present

## 2018-04-18 DIAGNOSIS — M5134 Other intervertebral disc degeneration, thoracic region: Secondary | ICD-10-CM | POA: Insufficient documentation

## 2018-04-18 DIAGNOSIS — F419 Anxiety disorder, unspecified: Secondary | ICD-10-CM

## 2018-04-18 DIAGNOSIS — Q790 Congenital diaphragmatic hernia: Secondary | ICD-10-CM | POA: Diagnosis not present

## 2018-04-18 DIAGNOSIS — R109 Unspecified abdominal pain: Secondary | ICD-10-CM | POA: Diagnosis not present

## 2018-04-18 DIAGNOSIS — G8929 Other chronic pain: Secondary | ICD-10-CM | POA: Insufficient documentation

## 2018-04-18 DIAGNOSIS — E785 Hyperlipidemia, unspecified: Secondary | ICD-10-CM

## 2018-04-18 LAB — LIPID PANEL
CHOL/HDL RATIO: 2
CHOLESTEROL: 137 mg/dL (ref 0–200)
HDL: 60.2 mg/dL (ref >39.00)
LDL Cholesterol: 57 mg/dL (ref 0–99)
NonHDL: 76.66
TRIGLYCERIDES: 97 mg/dL (ref 0.0–149.0)
VLDL: 19.4 mg/dL (ref 0.0–40.0)

## 2018-04-18 LAB — HEPATIC FUNCTION PANEL
ALK PHOS: 69 U/L (ref 39–117)
ALT: 13 U/L (ref 0–35)
AST: 22 U/L (ref 0–37)
Albumin: 4 g/dL (ref 3.5–5.2)
BILIRUBIN DIRECT: 0.1 mg/dL (ref 0.0–0.3)
BILIRUBIN TOTAL: 0.3 mg/dL (ref 0.2–1.2)
TOTAL PROTEIN: 6.9 g/dL (ref 6.0–8.3)

## 2018-04-18 NOTE — Progress Notes (Signed)
Results were reviewed and found to be: mildly abnormal  No acute injury or pathology identified  Review would suggest interventional pain management techniques may be of benefit 

## 2018-04-18 NOTE — Assessment & Plan Note (Signed)
Reports she saw a Psychologist, sport and exercise and they advised nothing to do about this type of hernia.

## 2018-04-18 NOTE — Assessment & Plan Note (Addendum)
Needs recheck of triglycerides given increasing dose of Crestor.  Labs ordered.

## 2018-04-18 NOTE — Patient Instructions (Signed)
Nice to see you. Good luck with your move. I am glad you are feeling better. We will check your cholesterol today and contact you with the results.

## 2018-04-18 NOTE — Assessment & Plan Note (Signed)
No chest pain.  I encouraged her to schedule follow-up with thoracic surgery prior to moving.

## 2018-04-18 NOTE — Progress Notes (Signed)
  [name], MD Phone: [phone]  Brittany Mays is a 77 y.o. female who presents today for f/u.  CC: anxiety/depression, hld, chronic abdominal pain  HYPERLIPIDEMIA Symptoms Chest pain on exertion:  no    Medications: Compliance- taking crestor Right upper quadrant pain- no  Muscle aches- no  Chronic abdominal pain: She has seen GI for this.  She had a colonoscopy with 1 polyp.  She did see a surgeon for her hernias and had her left inguinal hernia repaired.  She saw a GI specialist who placed her on MiraLAX once daily and Bentyl as needed.  She notes she is having daily bowel movements and her abdominal pain is significantly improved.  Anxiety/depression: She notes this is quite a bit better.  She is taking Cymbalta.  No SI.    Social History   Tobacco Use  Smoking Status Current Every Day Smoker  . Packs/day: 0.50  . Years: 50.00  . Pack years: 25.00  . Types: Cigarettes  Smokeless Tobacco Never Used     ROS see history of present illness  Objective  Physical Exam Vitals:   04/18/18 1127  BP: 110/72  Pulse: 91  Temp: 98.2 F (36.8 C)  SpO2: 94%    BP Readings from Last 3 Encounters:  04/18/18 110/72  04/16/18 (!) 130/93  03/11/18 101/69   Wt Readings from Last 3 Encounters:  04/18/18 111 lb (50.3 kg)  04/16/18 110 lb (49.9 kg)  03/11/18 110 lb (49.9 kg)    Physical Exam  Constitutional: No distress.  Cardiovascular: Normal rate, regular rhythm and normal heart sounds.  Pulmonary/Chest: Effort normal and breath sounds normal.  Abdominal: Soft. Bowel sounds are normal. She exhibits no distension. There is no tenderness.  Musculoskeletal: She exhibits no edema.  Neurological: She is alert.  Skin: Skin is warm and dry. She is not diaphoretic.     Assessment/Plan: Please see individual problem list.  Anxiety and depression Significantly improved.  She will continue Cymbalta.  Aortic aneurysm (HCC) No chest pain.  I encouraged her to  schedule follow-up with thoracic surgery prior to moving.  [place] hernia Reports she saw a Psychologist, sport and exercise and they advised nothing to do about this type of hernia.  Hyperlipidemia Needs recheck of triglycerides given increasing dose of Crestor.  Labs ordered.  Chronic abdominal pain Improved with MiraLAX.  She will continue this and continue to see GI.   Orders Placed This Encounter  Procedures  . Lipid panel  . Hepatic function panel    No orders of the defined types were placed in this encounter.    [name], MD [place]

## 2018-04-18 NOTE — Assessment & Plan Note (Signed)
Improved with MiraLAX.  She will continue this and continue to see GI.

## 2018-04-18 NOTE — Assessment & Plan Note (Addendum)
Significantly improved.  She will continue Cymbalta.

## 2018-04-18 NOTE — Progress Notes (Signed)
Results were reviewed and found to be: abnormal  No acute injury or pathology identified  Review would suggest interventional pain management techniques may be of benefit

## 2018-04-22 ENCOUNTER — Other Ambulatory Visit: Payer: Self-pay

## 2018-04-22 ENCOUNTER — Telehealth: Payer: Self-pay

## 2018-04-22 DIAGNOSIS — I712 Thoracic aortic aneurysm, without rupture, unspecified: Secondary | ICD-10-CM

## 2018-04-22 NOTE — Telephone Encounter (Signed)
Called patient but had to leave her a detailed message letting her know of her upcoming appointment to have her CT Scan and then follow up with Dr. Genevive Bi on 05/03/2018. This information will be mailed for the patient to know.  Patient called back before I signed the encounter and she stated that she will not be able to come in on 05/03/2018 since she will be closing her home to move out. I then told her that I would call her back once I had different dates. Patient had no further questions.

## 2018-04-23 ENCOUNTER — Telehealth: Payer: Self-pay | Admitting: Pain Medicine

## 2018-04-23 NOTE — Telephone Encounter (Signed)
Attempted to call patient, no answer, unable to leave a message

## 2018-04-23 NOTE — Telephone Encounter (Signed)
Patient would like results of her radiology reports. Please call

## 2018-04-24 NOTE — Telephone Encounter (Signed)
Patient's appointments were changed to 04/25/18 for her CT and appointment with Dr. Genevive Bi.

## 2018-04-25 ENCOUNTER — Ambulatory Visit: Admission: RE | Admit: 2018-04-25 | Payer: Medicare HMO | Source: Ambulatory Visit

## 2018-04-25 ENCOUNTER — Ambulatory Visit: Payer: Self-pay | Admitting: Cardiothoracic Surgery

## 2018-04-29 ENCOUNTER — Ambulatory Visit (INDEPENDENT_AMBULATORY_CARE_PROVIDER_SITE_OTHER): Payer: Medicare HMO | Admitting: Orthopaedic Surgery

## 2018-04-30 ENCOUNTER — Telehealth: Payer: Self-pay | Admitting: Cardiothoracic Surgery

## 2018-04-30 ENCOUNTER — Ambulatory Visit: Payer: Medicare HMO | Admitting: Pain Medicine

## 2018-04-30 NOTE — Telephone Encounter (Signed)
Called patient but had to leave a message with her husband to return my call.  Please provide this information to patient when she calls back. Patient needs to call Central Scheduling at [phone] to reschedule her CT Scan-Chest. Thank you.

## 2018-04-30 NOTE — Telephone Encounter (Signed)
Patient would like for you to call her in reference to her CT appointment - Please advise

## 2018-04-30 NOTE — Telephone Encounter (Signed)
Patient called back and I gave her the number to call central scheduling to reschedule. Once she does, I will reschedule her appointment with Dr. Genevive Bi.

## 2018-05-02 ENCOUNTER — Encounter: Payer: Self-pay | Admitting: Internal Medicine

## 2018-05-02 ENCOUNTER — Ambulatory Visit (INDEPENDENT_AMBULATORY_CARE_PROVIDER_SITE_OTHER): Payer: Medicare PPO | Admitting: Orthopaedic Surgery

## 2018-05-03 ENCOUNTER — Ambulatory Visit: Payer: Medicare HMO | Admitting: Cardiothoracic Surgery

## 2018-05-03 ENCOUNTER — Ambulatory Visit: Payer: Self-pay

## 2018-05-06 ENCOUNTER — Telehealth: Payer: Self-pay

## 2018-05-06 DIAGNOSIS — R109 Unspecified abdominal pain: Principal | ICD-10-CM

## 2018-05-06 DIAGNOSIS — G8929 Other chronic pain: Secondary | ICD-10-CM

## 2018-05-06 NOTE — Addendum Note (Signed)
Addended by: Caryl Bis, Graycen Degan G on: 05/06/2018 12:07 PM   Modules accepted: Orders

## 2018-05-06 NOTE — Telephone Encounter (Signed)
Copied from [place] [phone]. Topic: Referral - Request >> May 06, 2018  8:51 AM Ahmed Prima L wrote: Reason for CRM:  Patient states she has been having bowel trouble since last September and it has not gotten better. She said she has been to several different doctors for this. She would like to go to GI at [facility] and was not sure if she needed a referral from Dr Caryl Bis or could she just make an appointment. Please advise

## 2018-05-06 NOTE — Telephone Encounter (Signed)
Please advise 

## 2018-05-06 NOTE — Telephone Encounter (Signed)
I will place a referral.

## 2018-05-07 ENCOUNTER — Encounter: Payer: Self-pay | Admitting: Nurse Practitioner

## 2018-05-08 ENCOUNTER — Ambulatory Visit: Payer: Self-pay | Admitting: Family Medicine

## 2018-05-08 ENCOUNTER — Ambulatory Visit (INDEPENDENT_AMBULATORY_CARE_PROVIDER_SITE_OTHER): Payer: Medicare HMO | Admitting: Orthopaedic Surgery

## 2018-05-13 ENCOUNTER — Other Ambulatory Visit: Payer: Self-pay

## 2018-05-13 NOTE — Patient Outreach (Signed)
[facility]) Care Management  [place]  05/13/2018   UNNAMED HINO Mar 22, 1941 [id number]  Subjective: Telephone call to patient for follow up.  She is able to verify HIPAA.  She states that they are preparing to move to [place] in the next few days to be near their children.  Advised patient that is out of the service area for [facility] and that today would be the last call.  Patient agreeable and knew that this would be the final call. Patient appreciative of the support from [facility] and looks forward to the move.  Patient states that her stomach is ok and that she is managing.  Less episodes of constipation.  Patient voices no concerns.   Objective:   Encounter Medications:  Outpatient Encounter Medications as of 05/13/2018  Medication Sig Note  . cyclobenzaprine (FLEXERIL) 5 MG tablet TAKE 1 TABLET BY MOUTH THREE TIMES DAY AS NEEDED FOR MUSCLE SPASMS   . dicyclomine (BENTYL) 20 MG tablet Take 20 mg by mouth every 6 (six) hours as needed for spasms.  01/22/2018: Patient taking TID  . DULoxetine (CYMBALTA) 60 MG capsule TAKE 1 CAPSULE EVERY DAY   . Melatonin 5 MG CAPS Take 5 mg by mouth at bedtime.   . Multiple Vitamin (MULTIVITAMIN) tablet Take 1 tablet by mouth daily.   . Omega-3 Fatty Acids (OMEGA 3 500 PO) Take 500 mg by mouth daily.   . Oxycodone HCl 10 MG TABS Take 1 tablet (10 mg total) by mouth every 6 (six) hours as needed.   Derrill Memo ON 07/05/2018] Oxycodone HCl 10 MG TABS Take 1 tablet (10 mg total) by mouth every 6 (six) hours as needed.   Derrill Memo ON 06/05/2018] Oxycodone HCl 10 MG TABS Take 1 tablet (10 mg total) by mouth every 6 (six) hours as needed.   . polyethylene glycol (MIRALAX / GLYCOLAX) packet Take 17 g by mouth daily.   . rosuvastatin (CRESTOR) 20 MG tablet Take 1 tablet (20 mg total) by mouth daily.    No facility-administered encounter medications on file as of 05/13/2018.     Functional Status:  In your present state of health, do you have any  difficulty performing the following activities: 12/14/2017 12/03/2017  Hearing? N N  Vision? N N  Difficulty concentrating or making decisions? N N  Walking or climbing stairs? N N  Dressing or bathing? N N  Doing errands, shopping? N N  Preparing Food and eating ? N -  Using the Toilet? N -  In the past six months, have you accidently leaked urine? N -  Do you have problems with loss of bowel control? N -  Managing your Medications? N -  Managing your Finances? N -  Housekeeping or managing your Housekeeping? N -  Some recent data might be hidden    Fall/Depression Screening: Fall Risk  04/16/2018 02/05/2018 12/14/2017  Falls in the past year? No No No  Number falls in past yr: - - -  Injury with Fall? - - -   PHQ 2/9 Scores 04/16/2018 02/05/2018 12/14/2017 11/15/2017 10/08/2017 08/09/2017 05/29/2017  PHQ - 2 Score 0 0 1 0 0 0 0  PHQ- 9 Score - - - - - - -  Exception Documentation - - - - - - -    Assessment: Patient moving from service area and meeting care goals.  Plan:  [facility] CM Care Plan Problem One     Most Recent Value  Care Plan Problem One  Recent Hernia Repair  Role Documenting the Problem One  Care Management Telephonic Coordinator  Care Plan for Problem One  Active  [facility] CM Short Term Goal #2   Patient will report less episodes of constipation within 30 days.  [facility] CM Short Term Goal #2 Start Date  04/15/18  [facility] CM Short Term Goal #2 Met Date  05/13/18  Interventions for Short Term Goal #2  Patient managing well.       RN CM will close case and send closure letter to physician.  [name], RN, MSN [facility] Care Management Care Management Coordinator Direct Line [phone] Toll Free: [phone]  Fax: [phone]

## 2018-05-16 ENCOUNTER — Ambulatory Visit: Payer: Medicare HMO | Admitting: Pain Medicine

## 2018-05-16 ENCOUNTER — Telehealth: Payer: Self-pay | Admitting: *Deleted

## 2018-05-16 NOTE — Progress Notes (Deleted)
Patient's Name: Brittany Mays  MRN: [id number]  Referring Provider: [name], MD  DOB: 08-19-1941  PCP: [name], MD  DOS: 05/16/2018  Note by: Gaspar Cola, MD  Service setting: Ambulatory outpatient  Specialty: Interventional Pain Management  Patient type: Established  Location: [facility] (AMB) Pain Management Facility  Visit type: Interventional Procedure   Primary Reason for Visit: Interventional Pain Management Treatment. CC: No chief complaint on file.  Procedure:          Anesthesia, Analgesia, Anxiolysis:  Type: Thermal Lumbar Facet, Medial Branch Radiofrequency Ablation/Neurotomy #2 Level: L2, L3, L4, L5, & S1 Medial Branch Level(s). These levels will denervate the L3-4, L4-5, and the L5-S1 lumbar facet joints. Primary Purpose: Therapeutic Region: Posterolateral Lumbosacral Spine Laterality: Left  Type: Moderate (Conscious) Sedation combined with Local Anesthesia Indication(s): Analgesia and Anxiety Route: Intravenous (IV) IV Access: Secured Sedation: Meaningful verbal contact was maintained at all times during the procedure  Local Anesthetic: Lidocaine 1-2%   Indications: 1. Spondylosis without myelopathy or radiculopathy, lumbosacral region   2. Lumbar facet syndrome (Bilateral) (R>L)   3. Lumbar facet arthropathy ([place])   4. Chronic low back pain (Location of Primary Source of Pain) (Bilateral) (R>L)    Brittany Mays has been dealing with the above chronic pain for longer than three months and has either failed to respond, was unable to tolerate, or simply did not get enough benefit from other more conservative therapies including, but not limited to: 1. Over-the-counter medications 2. Anti-inflammatory medications 3. Muscle relaxants 4. Membrane stabilizers 5. Opioids 6. Physical therapy 7. Modalities (Heat, ice, etc.) 8. Invasive techniques such as nerve blocks. Brittany Mays has attained more than 50% relief of the pain from a series of diagnostic  injections conducted in separate occasions.  Pain Score: Pre-procedure:  /10 Post-procedure:  /10  Pre-op Assessment:  Brittany Mays is a 77 y.o. (year old), female patient, seen today for interventional treatment. 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 Brittany Mays has a current medication list which includes the following prescription(s): cyclobenzaprine, dicyclomine, duloxetine, melatonin, multivitamin, omega-3 fatty acids, oxycodone hcl, oxycodone hcl, oxycodone hcl, polyethylene glycol, and rosuvastatin. Her primarily concern today is the No chief complaint on file.  Initial Vital Signs:  Pulse/HCG Rate:    Temp:   Resp:   BP:   SpO2:    BMI: Estimated body mass index is 21.68 kg/m as calculated from the following:   Height as of 04/18/18: 5' (1.524 m).   Weight as of 04/18/18: 111 lb (50.3 kg).  Risk Assessment: Allergies: Reviewed. She is allergic to fentanyl; nsaids; and pentothal [thiopental].  Allergy Precautions: None required Coagulopathies: Reviewed. None identified.  Blood-thinner therapy: None at this time Active Infection(s): Reviewed. None identified. Brittany Mays is afebrile  Site Confirmation: Brittany Mays was asked to confirm the procedure and laterality before marking the site Procedure checklist: Completed Consent: Before the procedure and under the influence of no sedative(s), amnesic(s), or anxiolytics, the patient was informed of the treatment options, risks and possible complications. To fulfill our ethical and legal obligations, as  recommended by the American Medical Association's Code of Ethics, I have informed the patient of my clinical impression; the nature  and purpose of the treatment or procedure; the risks, benefits, and possible complications of the intervention; the alternatives, including doing nothing; the risk(s) and benefit(s) of the alternative treatment(s) or procedure(s); and the risk(s) and benefit(s) of doing nothing. The patient was provided information about the general risks and possible complications associated with the procedure. These may include, but are not limited to: failure to achieve desired goals, infection, bleeding, organ or nerve damage, allergic reactions, paralysis, and death. In addition, the patient was informed of those risks and complications associated to Spine-related procedures, such as failure to decrease pain; infection (i.e.: Meningitis, epidural or intraspinal abscess); bleeding (i.e.: epidural hematoma, subarachnoid hemorrhage, or any other type of intraspinal or peri-dural bleeding); organ or nerve damage (i.e.: Any type of peripheral nerve, nerve root, or spinal cord injury) with subsequent damage to sensory, motor, and/or autonomic systems, resulting in permanent pain, numbness, and/or weakness of one or several areas of the body; allergic reactions; (i.e.: anaphylactic reaction); and/or death. Furthermore, the patient was informed of those risks and complications associated with the medications. These include, but are not limited to: allergic reactions (i.e.: anaphylactic or anaphylactoid reaction(s)); adrenal axis suppression; blood sugar elevation that in diabetics may result in ketoacidosis or comma; water retention that in patients with history of congestive heart failure may result in shortness of breath, pulmonary edema, and decompensation with resultant heart failure; weight gain; swelling or edema; medication-induced neural toxicity; particulate matter embolism and blood vessel  occlusion with resultant organ, and/or nervous system infarction; and/or aseptic necrosis of one or more joints. Finally, the patient was informed that Medicine is not an exact science; therefore, there is also the possibility of unforeseen or unpredictable risks and/or possible complications that may result in a catastrophic outcome. The patient indicated having understood very clearly. We have given the patient no guarantees and we have made no promises. Enough time was given to the patient to ask questions, all of which were answered to the patient's satisfaction. Ms. Srinivasan has indicated that she wanted to continue with the procedure. Attestation: I, the ordering provider, attest that I have discussed with the patient the benefits, risks, side-effects, alternatives, likelihood of achieving goals, and potential problems during recovery for the procedure that I have provided informed consent. Date  Time: {CHL [facility]-PAIN TIME CHOICES:[id number]}  Pre-Procedure Preparation:  Monitoring: As per clinic protocol. Respiration, ETCO2, SpO2, BP, heart rate and rhythm monitor placed and checked for adequate function Safety Precautions: Patient was assessed for positional comfort and pressure points before starting the procedure. Time-out: I initiated and conducted the "Time-out" before starting the procedure, as per protocol. The patient was asked to participate by confirming the accuracy of the "Time Out" information. Verification of the correct person, site, and procedure were performed and confirmed by me, the nursing staff, and the patient. "Time-out" conducted as per Joint Commission's Universal Protocol (UP.01.01.01). Time:    Description of Procedure:          Position: Prone Laterality: Left Levels:  L2, L3, L4, L5, & S1 Medial Branch Level(s), at the L3-4, L4-5, and the L5-S1 lumbar facet joints. Area Prepped: Lumbosacral Prepping solution: ChloraPrep (2% chlorhexidine gluconate and 70% isopropyl  alcohol) Safety Precautions: Aspiration looking for blood return was conducted prior to all injections. At no point did we inject any substances, as a needle was being advanced. Before injecting, the patient was told to immediately notify me if she was experiencing any new onset of "ringing in the ears, or metallic taste in  the mouth". No attempts were made at seeking any paresthesias. Safe injection practices and needle disposal techniques used. Medications properly checked for expiration dates. SDV (single dose vial) medications used. After the completion of the procedure, all disposable equipment used was discarded in the proper designated medical waste containers. Local Anesthesia: Protocol guidelines were followed. The patient was positioned over the fluoroscopy table. The area was prepped in the usual manner. The time-out was completed. The target area was identified using fluoroscopy. A 12-in long, straight, sterile hemostat was used with fluoroscopic guidance to locate the targets for each level blocked. Once located, the skin was marked with an approved surgical skin marker. Once all sites were marked, the skin (epidermis, dermis, and hypodermis), as well as deeper tissues (fat, connective tissue and muscle) were infiltrated with a small amount of a short-acting local anesthetic, loaded on a 10cc syringe with a 25G, 1.5-in  Needle. An appropriate amount of time was allowed for local anesthetics to take effect before proceeding to the next step. Local Anesthetic: Lidocaine 2.0% The unused portion of the local anesthetic was discarded in the proper designated containers. Technical explanation of process:  Radiofrequency Ablation (RFA) L2 Medial Branch Nerve RFA: The target area for the L2 medial branch is at the junction of the postero-lateral aspect of the superior articular process and the superior, posterior, and medial edge of the transverse process of L3. Under fluoroscopic guidance, a  Radiofrequency needle was inserted until contact was made with os over the superior postero-lateral aspect of the pedicular shadow (target area). Sensory and motor testing was conducted to properly adjust the position of the needle. Once satisfactory placement of the needle was achieved, the numbing solution was slowly injected after negative aspiration for blood. 2.0 mL of the nerve block solution was injected without difficulty or complication. After waiting for at least 3 minutes, the ablation was performed. Once completed, the needle was removed intact. L3 Medial Branch Nerve RFA: The target area for the L3 medial branch is at the junction of the postero-lateral aspect of the superior articular process and the superior, posterior, and medial edge of the transverse process of L4. Under fluoroscopic guidance, a Radiofrequency needle was inserted until contact was made with os over the superior postero-lateral aspect of the pedicular shadow (target area). Sensory and motor testing was conducted to properly adjust the position of the needle. Once satisfactory placement of the needle was achieved, the numbing solution was slowly injected after negative aspiration for blood. 2.0 mL of the nerve block solution was injected without difficulty or complication. After waiting for at least 3 minutes, the ablation was performed. Once completed, the needle was removed intact. L4 Medial Branch Nerve RFA: The target area for the L4 medial branch is at the junction of the postero-lateral aspect of the superior articular process and the superior, posterior, and medial edge of the transverse process of L5. Under fluoroscopic guidance, a Radiofrequency needle was inserted until contact was made with os over the superior postero-lateral aspect of the pedicular shadow (target area). Sensory and motor testing was conducted to properly adjust the position of the needle. Once satisfactory placement of the needle was achieved, the  numbing solution was slowly injected after negative aspiration for blood. 2.0 mL of the nerve block solution was injected without difficulty or complication. After waiting for at least 3 minutes, the ablation was performed. Once completed, the needle was removed intact. L5 Medial Branch Nerve RFA: The target area for the L5 medial branch  is at the junction of the postero-lateral aspect of the superior articular process of S1 and the superior, posterior, and medial edge of the sacral ala. Under fluoroscopic guidance, a Radiofrequency needle was inserted until contact was made with os over the superior postero-lateral aspect of the pedicular shadow (target area). Sensory and motor testing was conducted to properly adjust the position of the needle. Once satisfactory placement of the needle was achieved, the numbing solution was slowly injected after negative aspiration for blood. 2.0 mL of the nerve block solution was injected without difficulty or complication. After waiting for at least 3 minutes, the ablation was performed. Once completed, the needle was removed intact. S1 Medial Branch Nerve RFA: The target area for the S1 medial branch is located inferior to the junction of the S1 superior articular process and the L5 inferior articular process, posterior, inferior, and lateral to the 6 o'clock position of the L5-S1 facet joint, just superior to the S1 posterior foramen. Under fluoroscopic guidance, the Radiofrequency needle was advanced until contact was made with os over the Target area. Sensory and motor testing was conducted to properly adjust the position of the needle. Once satisfactory placement of the needle was achieved, the numbing solution was slowly injected after negative aspiration for blood. 2.0 mL of the nerve block solution was injected without difficulty or complication. After waiting for at least 3 minutes, the ablation was performed. Once completed, the needle was removed  intact. Radiofrequency lesioning (ablation):  Radiofrequency Generator: NeuroTherm [id number] Sensory Stimulation Parameters: 50 Hz was used to locate & identify the nerve, making sure that the needle was positioned such that there was no sensory stimulation below 0.3 V or above 0.7 V. Motor Stimulation Parameters: 2 Hz was used to evaluate the motor component. Care was taken not to lesion any nerves that demonstrated motor stimulation of the lower extremities at an output of less than 2.5 times that of the sensory threshold, or a maximum of 2.0 V. Lesioning Technique Parameters: Standard Radiofrequency settings. (Not bipolar or pulsed.) Temperature Settings: 80 degrees C Lesioning time: 60 seconds Intra-operative Compliance: Compliant Materials & Medications: Needle(s) (Electrode/Cannula) Type: Teflon-coated, curved tip, Radiofrequency needle(s) Gauge: 22G Length: 10cm Numbing solution: 0.2% PF-Ropivacaine + Triamcinolone (40 mg/mL) diluted to a final concentration of 4 mg of Triamcinolone/mL of Ropivacaine The unused portion of the solution was discarded in the proper designated containers.  Once the entire procedure was completed, the treated area was cleaned, making sure to leave some of the prepping solution back to take advantage of its long term bactericidal properties.  Illustration of the posterior view of the lumbar spine and the posterior neural structures. Laminae of L2 through S1 are labeled. DPRL5, dorsal primary ramus of L5; DPRS1, dorsal primary ramus of S1; DPR3, dorsal primary ramus of L3; FJ, facet (zygapophyseal) joint L3-L4; I, inferior articular process of L4; LB1, lateral branch of dorsal primary ramus of L1; IAB, inferior articular branches from L3 medial branch (supplies L4-L5 facet joint); IBP, intermediate branch plexus; MB3, medial branch of dorsal primary ramus of L3; NR3, third lumbar nerve root; S, superior articular process of L5; SAB, superior articular branches from L4  (supplies L4-5 facet joint also); TP3, transverse process of L3.  There were no vitals filed for this visit.  Start Time:   hrs. End Time:   hrs.  Imaging Guidance (Spinal):          Type of Imaging Technique: Fluoroscopy Guidance (Spinal) Indication(s): Assistance in needle guidance and placement  for procedures requiring needle placement in or near specific anatomical locations not easily accessible without such assistance. Exposure Time: Please see nurses notes. Contrast: None used. Fluoroscopic Guidance: I was personally present during the use of fluoroscopy. "Tunnel Vision Technique" used to obtain the best possible view of the target area. Parallax error corrected before commencing the procedure. "Direction-depth-direction" technique used to introduce the needle under continuous pulsed fluoroscopy. Once target was reached, antero-posterior, oblique, and lateral fluoroscopic projection used confirm needle placement in all planes. Images permanently stored in EMR. Interpretation: No contrast injected. I personally interpreted the imaging intraoperatively. Adequate needle placement confirmed in multiple planes. Permanent images saved into the patient's record.  Antibiotic Prophylaxis:   Anti-infectives (From admission, onward)   None     Indication(s): None identified  Post-operative Assessment:  Post-procedure Vital Signs:  Pulse/HCG Rate:    Temp:   Resp:   BP:   SpO2:    EBL: None  Complications: No immediate post-treatment complications observed by team, or reported by patient.  Note: The patient tolerated the entire procedure well. A repeat set of vitals were taken after the procedure and the patient was kept under observation following institutional policy, for this type of procedure. Post-procedural neurological assessment was performed, showing return to baseline, prior to discharge. The patient was provided with post-procedure discharge instructions, including a section  on how to identify potential problems. Should any problems arise concerning this procedure, the patient was given instructions to immediately contact us, at any time, without hesitation. In any case, we plan to contact the patient by telephone for a follow-up status report regarding this interventional procedure.  Comments:  No additional relevant information.  Plan of Care   Imaging Orders  No imaging studies ordered today   Procedure Orders    No procedure(s) ordered today    Medications ordered for procedure: No orders of the defined types were placed in this encounter.  Medications administered: Anthonette L. Buchmann had no medications administered during this visit.  See the medical record for exact dosing, route, and time of administration.  New Prescriptions   No medications on file   Disposition: Discharge home  Discharge Date & Time: 05/16/2018;   hrs.   Physician-requested Follow-up: No follow-ups on file.  Future Appointments  Date Time Provider [place]  05/16/2018 12:45 PM Milinda Pointer, MD [facility] None  05/20/2018  1:00 PM [facility]  05/21/2018  1:30 PM Nestor Lewandowsky, MD [facility] None  07/17/2018 12:45 PM Vevelyn Francois, NP [facility] None   Primary Care Physician: [name], MD Location: [facility] Note by: Gaspar Cola, MD Date: 05/16/2018; Time: 6:53 AM  Disclaimer:  Medicine is not an Chief Strategy Officer. The only guarantee in medicine is that nothing is guaranteed. It is important to note that the decision to proceed with this intervention was based on the information collected from the patient. The Data and conclusions were drawn from the patient's questionnaire, the interview, and the physical examination. Because the information was provided in large part by the patient, it cannot be guaranteed that it has not been purposely or unconsciously manipulated. Every effort has been made to obtain as  much relevant data as possible for this evaluation. It is important to note that the conclusions that lead to this procedure are derived in large part from the available data. Always take into account that the treatment will also be dependent on availability of resources and existing treatment guidelines, considered by  other Pain Management Practitioners as being common knowledge and practice, at the time of the intervention. For Medico-Legal purposes, it is also important to point out that variation in procedural techniques and pharmacological choices are the acceptable norm. The indications, contraindications, technique, and results of the above procedure should only be interpreted and judged by a Board-Certified Interventional Pain Specialist with extensive familiarity and expertise in the same exact procedure and technique.

## 2018-05-20 ENCOUNTER — Ambulatory Visit: Payer: Self-pay

## 2018-05-20 ENCOUNTER — Ambulatory Visit: Payer: Self-pay | Admitting: Cardiothoracic Surgery

## 2018-05-20 ENCOUNTER — Telehealth: Payer: Self-pay | Admitting: Cardiothoracic Surgery

## 2018-05-20 ENCOUNTER — Ambulatory Visit: Admission: RE | Admit: 2018-05-20 | Payer: Medicare HMO | Source: Ambulatory Visit

## 2018-05-20 NOTE — Telephone Encounter (Signed)
Tried calling patient several times to reschedule her appointment with Dr. Genevive Bi since she did not have her CT Scan done yesterday because she did not show up today. Patient's appointment with Dr. Genevive Bi will be cancelled. I will continue to call her and hopefully she could reschedule her CT Scan and hopefully be seen on Friday if she had her CT Scan done within this week.  If patient calls, please give her the number for Central Scheduling [phone]) for her to reschedule her CT Scan and then she will need a follow up appointment with Dr. Genevive Bi hopefully this Friday or next available.

## 2018-05-20 NOTE — Telephone Encounter (Signed)
Brittany Mays is calling from CT patient was due to have a ct scan done today and no showed. They said they would be able to get the patient back on the schedule possibly tomorrow. Patient is due to come in tomorrow and see Dr. Genevive Bi

## 2018-05-21 ENCOUNTER — Ambulatory Visit: Payer: Self-pay | Admitting: Cardiothoracic Surgery

## 2018-05-21 NOTE — Telephone Encounter (Signed)
Called patient again but was not able to get in contact with her. Patient's appointment with Dr. Genevive Bi had to be cancelled since she did not show up to have her CT Scan. Patient is to call [phone] to reschedule her CT Scan and then she will need to see Dr. Genevive Bi. If patient calls back please provide her with this information. Thank you.

## 2018-05-23 ENCOUNTER — Ambulatory Visit: Payer: Medicare HMO | Admitting: Nurse Practitioner

## 2018-06-03 ENCOUNTER — Other Ambulatory Visit: Payer: Self-pay

## 2018-06-03 NOTE — Telephone Encounter (Signed)
Last OV 04/18/18 last filled 02/13/2017 [id number] [id number]

## 2018-06-03 NOTE — Telephone Encounter (Signed)
It appears this medication was previously removed from her medication list.  Please contact her to see if she has continued to take this.  I also believe that she moved previously so she needs to establish with a new provider if she has not.

## 2018-06-04 NOTE — Telephone Encounter (Signed)
Tried calling, no voicemail ok for pec to speak to patient about refill request

## 2018-06-06 ENCOUNTER — Telehealth: Payer: Self-pay

## 2018-06-06 MED ORDER — GABAPENTIN 300 MG PO CAPS: ORAL_CAPSULE | ORAL | 3 refills | Status: AC

## 2018-06-06 NOTE — Telephone Encounter (Signed)
Brittany Mays called and pharmacy in [place] Won't fill her RX  From [employer] for Oxycodone without IT consultant. It's [facility] [address], [facility] [id number] Ph# [phone]

## 2018-06-06 NOTE — Telephone Encounter (Signed)
Spoke with Mrs Starry who has moved to [place] and is having trouble getting her Rx filled.  She did go to [facility] but they would only fill 1 weeks worth of her medicine.  She states that she spoke with Juliann Pulse and she is working to get a referral for her to a pain clinic there in [place]. Mrs Hoadley did mention that if this didn't work possibly she could have her son come and pick up an Rx.  I did tell her that would be up the provider and that this would be an exception to the rule as we normally dont prescribe without the patient being seen.

## 2018-07-02 ENCOUNTER — Telehealth: Payer: Self-pay | Admitting: Internal Medicine

## 2018-07-02 NOTE — Telephone Encounter (Signed)
Patient has moved to [place] and will no longer be following up with our office

## 2018-07-11 ENCOUNTER — Telehealth (INDEPENDENT_AMBULATORY_CARE_PROVIDER_SITE_OTHER): Payer: Self-pay | Admitting: Orthopaedic Surgery

## 2018-07-11 ENCOUNTER — Encounter: Payer: Self-pay | Admitting: *Deleted

## 2018-07-11 NOTE — Telephone Encounter (Signed)
I received vm from patient wanting records be sent to another office. I called patient back [phone] ,lmvm advised we need signed authorization before we can release records. Stated for patient to call us back if we need to send for to her

## 2018-07-17 ENCOUNTER — Encounter: Payer: Self-pay | Admitting: Nurse Practitioner

## 2018-07-22 ENCOUNTER — Ambulatory Visit (INDEPENDENT_AMBULATORY_CARE_PROVIDER_SITE_OTHER): Payer: Medicare HMO | Admitting: Orthopaedic Surgery

## 2018-11-15 IMAGING — CT CT ABD-PELV W/ CM
2 of 5 series · 16 of 46 positions shown, 18 images · IV contrast (APPLIED)
Comparison: 09/10/2015

CLINICAL DATA: Vomiting for 1 week. Possibly urinary tract
infection. Evaluate for peritonitis.

EXAM:
CT ABDOMEN AND PELVIS WITH CONTRAST
TECHNIQUE: Multidetector CT imaging of the abdomen and pelvis was performed
using the standard protocol following bolus administration of
intravenous contrast.
CONTRAST:  75mL BCNRRG-RGG IOPAMIDOL (BCNRRG-RGG) INJECTION 61%

[Series 5: coronal st · coronal · 0.68mm/px · 3 of 72 slices shown]
[im 24/72  soft-tissue]
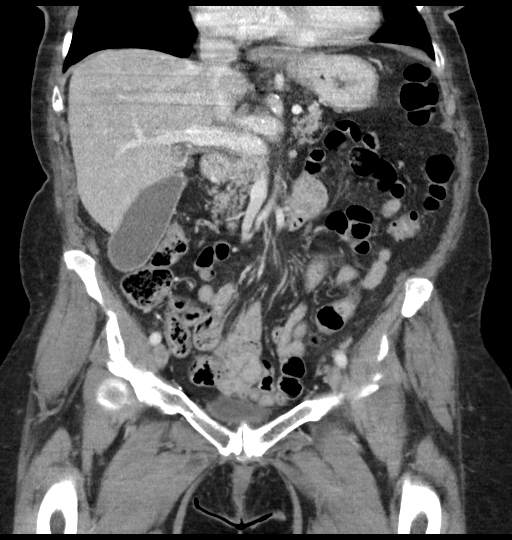
[im 32/72  soft-tissue]
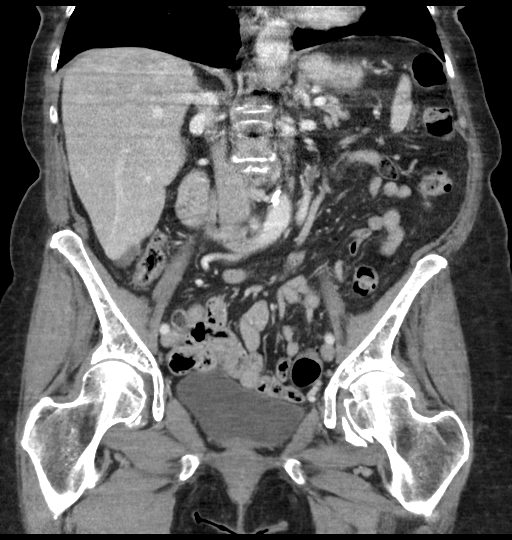
[im 40/72  soft-tissue]
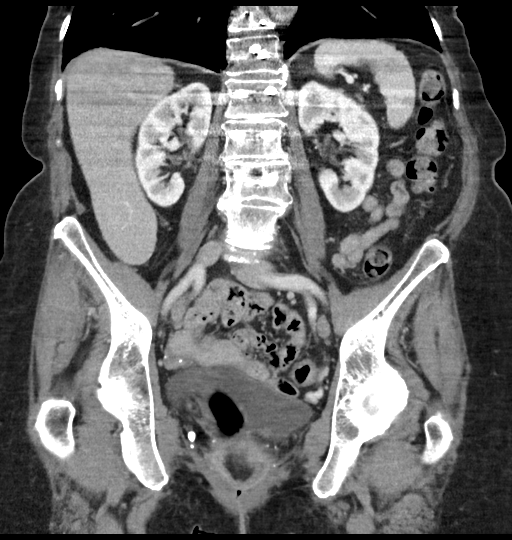

[Series 7: routine abd/pel with (person_name) · axial · 0.70mm/px · z∈[-251,+74]mm · 13 of 73 slices shown, 15 images]
[im 4/73  soft-tissue]
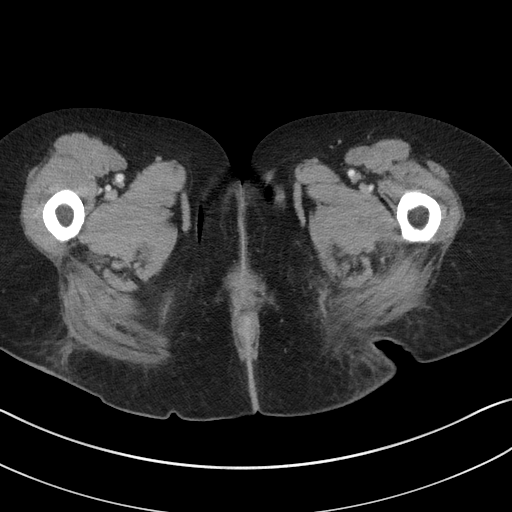
[im 4/73  bone]
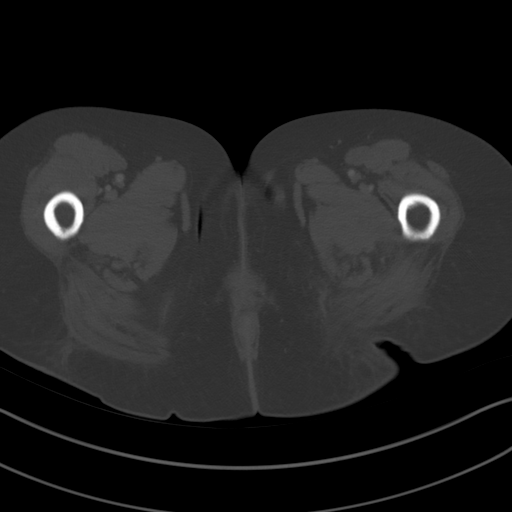
[im 12/73  soft-tissue]
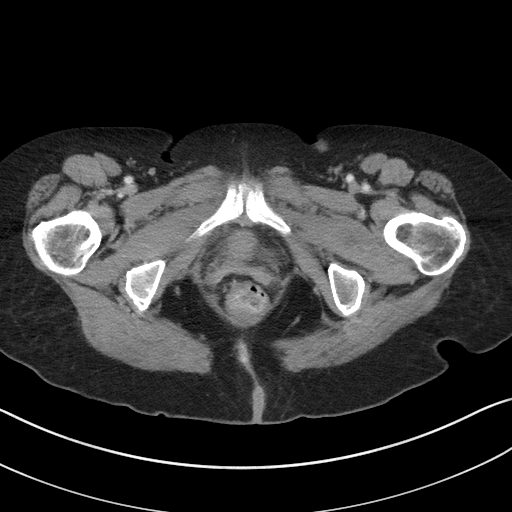
[im 16/73  soft-tissue]
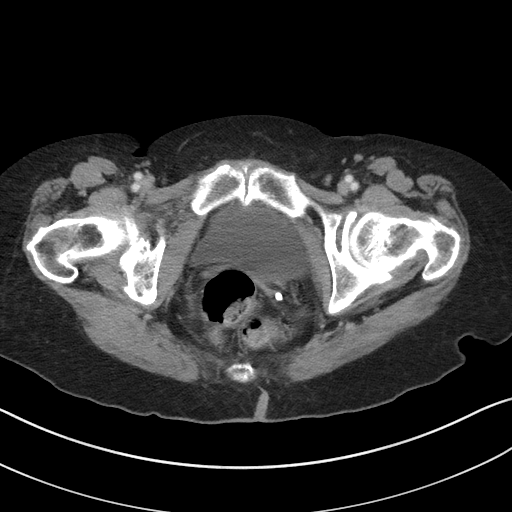
[im 19/73  soft-tissue]
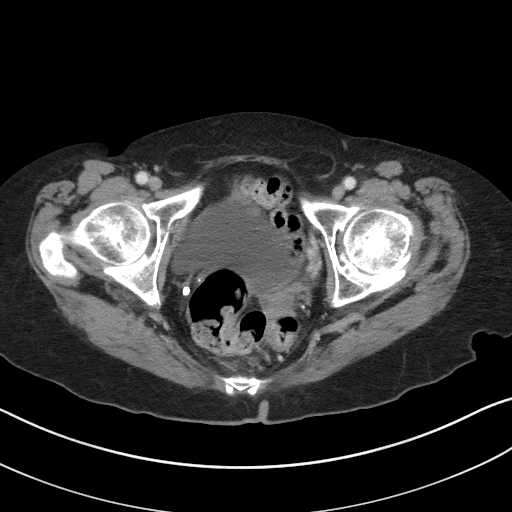
[im 27/73  soft-tissue]
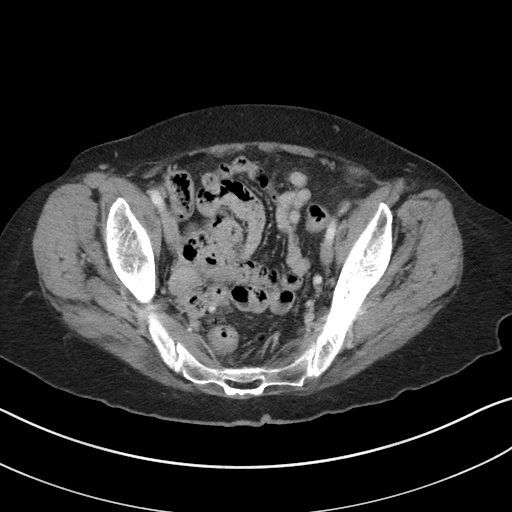
[im 31/73  soft-tissue]
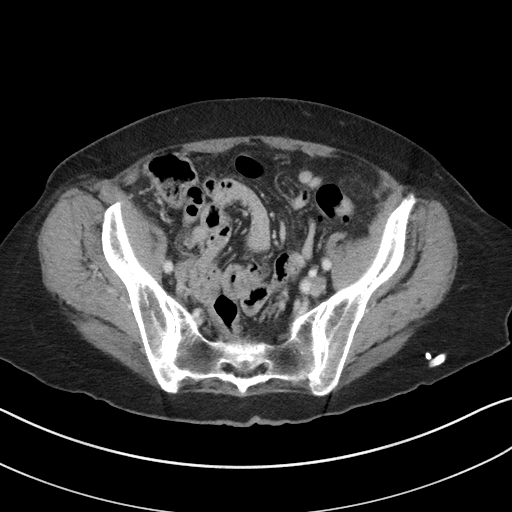
[im 38/73  soft-tissue]
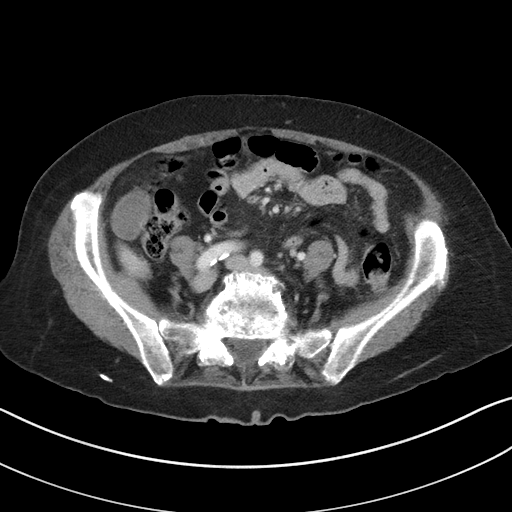
[im 42/73  soft-tissue]
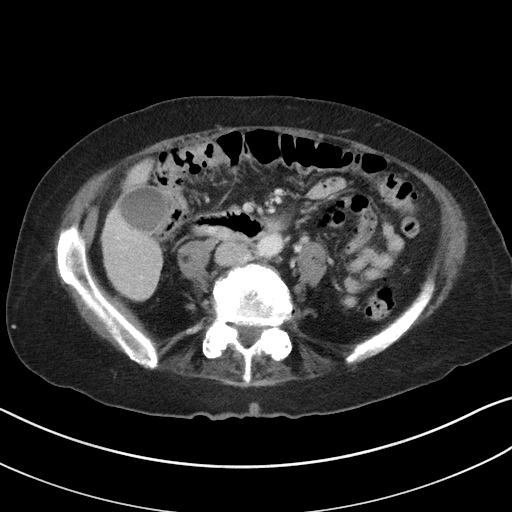
[im 46/73  soft-tissue]
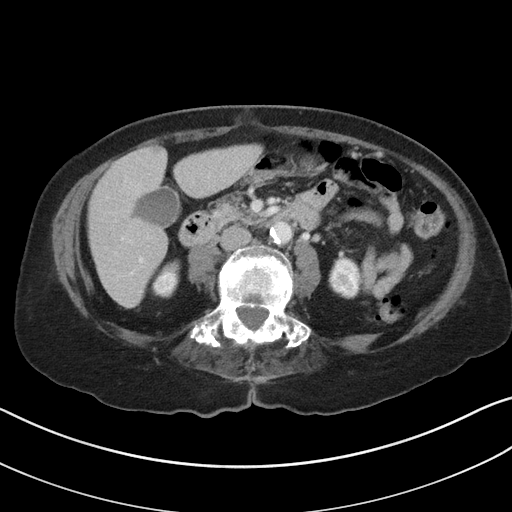
[im 46/73  bone]
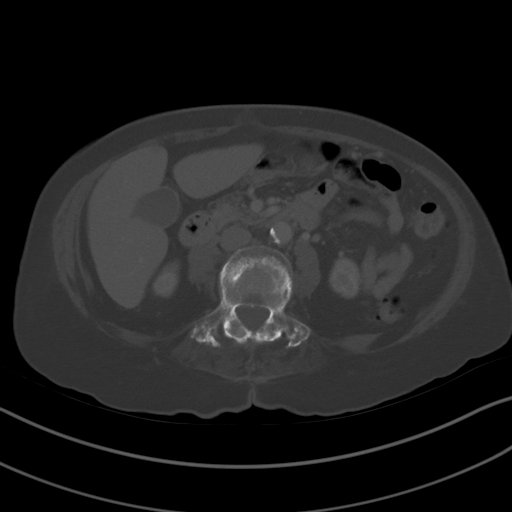
[im 54/73  soft-tissue]
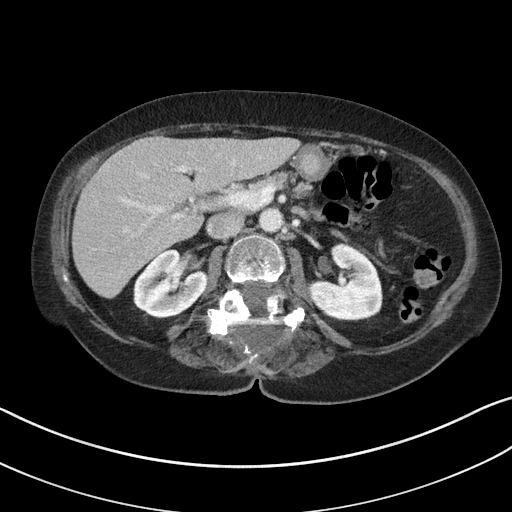
[im 57/73  soft-tissue]
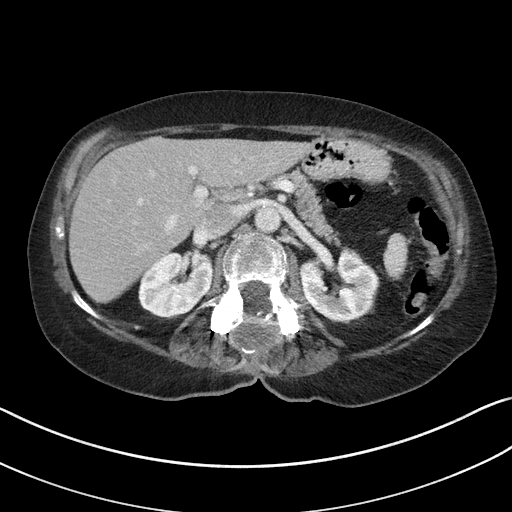
[im 61/73  soft-tissue]
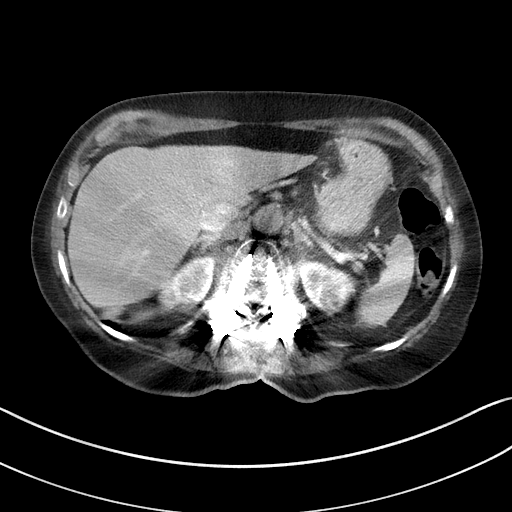
[im 69/73  soft-tissue]
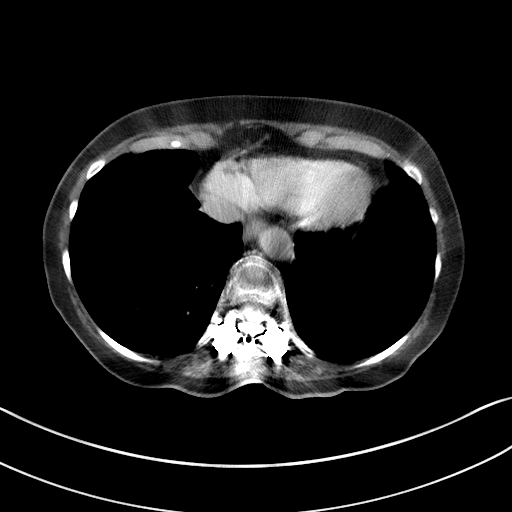

[16 of 46 positions shown; findings below may reference images not displayed]

FINDINGS: Lower chest: Clear lung bases. Normal heart size without pericardial
or pleural effusion. Fat containing right-sided Bochdalek's hernia.

Hepatobiliary: Focal steatosis adjacent the falciform ligament.
Normal gallbladder, without biliary ductal dilatation.

Pancreas: Mild pancreatic atrophy, without mass or duct dilatation.

Spleen: Normal in size, without focal abnormality.

Adrenals/Urinary Tract: Normal adrenal glands. Beam hardening
artifact secondary to spinal hardware degrades evaluation of the
abdomen, especially the retroperitoneum. Too small to characterize
lesions in both kidneys, without hydronephrosis. Normal urinary
bladder.

Stomach/Bowel: Proximal gastric underdistention. Normal small bowel.

Vascular/Lymphatic: Aortic and branch vessel atherosclerosis. No
abdominopelvic adenopathy.

Reproductive: Hysterectomy.  No adnexal mass.

Other: No significant free fluid. Mild to moderate pelvic floor
laxity. Fat containing periumbilical abdominal wall laxity as well.
Fat containing small bilateral inguinal hernias.

Musculoskeletal: Left hip osteoarthritis. Osteopenia. Grade 1 L4-5
anterolisthesis with lumbosacral degenerative disc disease.
Thoracolumbar trans pedicle screw fixation.
IMPRESSION: 1.  No acute process in the abdomen or pelvis.
2.  Aortic Atherosclerosis ([id number]).
3. Pelvic floor laxity.

## 2019-03-13 ENCOUNTER — Other Ambulatory Visit: Payer: Self-pay

## 2020-07-08 ENCOUNTER — Telehealth: Payer: Self-pay | Admitting: Family Medicine

## 2020-07-08 NOTE — Telephone Encounter (Signed)
-----   Message from Salvatore Marvel sent at 07/08/2020  3:41 PM EDT ----- Regarding: remove pcp Cancel reason for 05/2018 appt states pt is moving

## 2020-07-08 NOTE — Telephone Encounter (Signed)
PCP removed patient moving.

## 2020-11-24 ENCOUNTER — Encounter: Admit: 2020-11-24 | Payer: PRIVATE HEALTH INSURANCE

## 2022-11-07 ENCOUNTER — Encounter
Admit: 2022-11-07 | Payer: PRIVATE HEALTH INSURANCE | Attending: Student in an Organized Health Care Education/Training Program

## 2022-11-07 ENCOUNTER — Ambulatory Visit
Admit: 2022-11-07 | Payer: PRIVATE HEALTH INSURANCE | Attending: Student in an Organized Health Care Education/Training Program

## 2022-11-07 ENCOUNTER — Ambulatory Visit: Admit: 2022-11-07 | Payer: PRIVATE HEALTH INSURANCE

## 2022-11-07 DIAGNOSIS — E785 Hyperlipidemia, unspecified: Secondary | ICD-10-CM

## 2022-11-07 DIAGNOSIS — K635 Polyp of colon: Secondary | ICD-10-CM

## 2022-11-07 DIAGNOSIS — K5900 Constipation, unspecified: Secondary | ICD-10-CM

## 2022-11-07 DIAGNOSIS — I499 Cardiac arrhythmia, unspecified: Secondary | ICD-10-CM

## 2022-11-07 DIAGNOSIS — F32A Depression: Secondary | ICD-10-CM

## 2022-11-07 DIAGNOSIS — M7989 Other specified soft tissue disorders: Secondary | ICD-10-CM

## 2022-11-07 MED ORDER — ZINC GLUCONATE 50 MG TABLET
50 mg | Freq: Every day | ORAL | Status: AC
Start: 2022-11-07 — End: ?

## 2022-11-07 MED ORDER — ASPIRIN 81 MG TABLET,DELAYED RELEASE
81 mg | ORAL | Status: AC
Start: 2022-11-07 — End: ?

## 2022-11-07 MED ORDER — GABAPENTIN 300 MG CAPSULE
300 mg | ORAL | Status: AC
Start: 2022-11-07 — End: ?

## 2022-11-08 NOTE — Progress Notes
[facility] Office NoteSubjective: Chief Complaint:Bilateral to discolorationHPI:Holly Erickson is a 82 y.o. female patient with a history of dyslipidemia and active tobacco abuse who presents on recommendation of her PCP for discoloration of the toes on bilateral feet left greater than right. She denies claudication, rest pain or nonhealing wounds.  She reports some foot swelling which is worse at the end of the day.  She has a history of bilateral knee replacement but no arterial or venous interventions.  Of note the foot discolorations are not associated with any pain.  She does not know how long they have been there for and claims her attention was only drawn to it by PCP.Past Medical History: Diagnosis Date ? Arrhythmia  ? Colon polyp  ? Constipation  ? Depression  ? Hyperlipidemia  Past Surgical History: Procedure Laterality Date ? APPENDECTOMY   ? BACK SURGERY   ? COLONOSCOPY  03/11/2018  [facility] ? HERNIA REPAIR   ? HYSTERECTOMY   ? OOPHORECTOMY Bilateral  ? PARATHYROIDECTOMY   ? REPLACEMENT TOTAL HIP W/  RESURFACING IMPLANTS Left  ? REPLACEMENT TOTAL KNEE BILATERAL   ? RHINOPLASTY   ? ROTATOR CUFF REPAIR Right  ? TONSILLECTOMY   Social History:She  reports that she has been smoking. She does not have any smokeless tobacco history on file. She reports that she does not currently use alcohol. No history on file for drug use.Medications:Current Outpatient Medications Medication Sig Dispense Refill ? ascorbic acid (VITAMIN C ORAL) Take by mouth daily.   ? DULoxetine (CYMBALTA) 60 MG capsule Take 1 capsule (60 mg total) by mouth daily.   ? melatonin 5 mg tablet Take 1 tablet (5 mg total) by mouth nightly.   ? multivitamin capsule Take 1 capsule by mouth daily.   ? rosuvastatin (CRESTOR) 20 MG tablet Take 1 tablet (20 mg total) by mouth daily.   ? zinc gluconate 50 mg tablet Take 1 tablet (50 mg total) by mouth daily.   ? aspirin 81 mg EC delayed release tablet Take 1 tablet (81 mg total) by mouth daily.   ? enzymes,digestive (DIGESTIVE ENZYMES ORAL) Take by mouth daily. (Patient not taking: Reported on 11/07/2022)   ? gabapentin (NEURONTIN) 300 mg capsule Take 1 capsule (300 mg total) by mouth.   No current facility-administered medications for this visit. Allergies:Allergies Allergen Reactions ? Sodium Pentothal [Thiopental Sodium]    Childhood ? Duragesic [Fentanyl] Itching and Dizziness   PATCH ONLY.. Oral form is ok Blood pressure 120/78, pulse (!) 96, height 5' (1.524 m), weight 52.2 kg, SpO2 98 %.Review of Systems:ROSObjective: Physical ExamGeneral: AAOx3, not in acute distressHEENT: Normocephalic, atraumaticCV: regular pulsePulm: breathing calmly in room airAbd: Soft, non tenderExtremities: BLE: Palpable femoral, DP/PT. No wounds.  Dependent rubor in the toes improved with elevationLabs:Lab Results Component Value Date  BUN 19 07/12/2018 Lab Results Component Value Date  CREATININE 0.76 07/12/2018 Lab Results Component Value Date  K 4.8 07/12/2018 Lab Results Component Value Date  WBC 7.2 07/12/2018  HGB 13.9 07/12/2018  HCT 41.6 07/12/2018  MCV 95.9 (H) 07/12/2018  PLT 197 07/12/2018 No results found for: INR, PROTIME, No results found for: PTTDiagnostics:ABI/TP 1/2/24FINDINGS: Multiphasic waveforms seen in the bilateral posterior tibial and dorsalis pedis arteries.  The right ABI is 1.01.  The left ABI is 0.93.  ?Right:Brachial - 152 mmHgPTA - 155 mmHgDPA - 150 mmHg?Left:Brachial - 139 mmHgPTA - 142 mmHgDPA - 128 mmHg??IMPRESSION:?No evidence of right lower extremity arterial ischemia at rest by ABI criteria.Mild left lower extremity  arterial ischemia at rest by ABI criteria.?Impression/PlanA 7-year-old lady with a history of dyslipidemia, active tobacco abuse with venous congestion of the toes in bilateral feetComments graded compression and elevationArterial insufficiency is an unlikely etiology because she has no associated pain, ulcer/gangrene and she has a palpable pedal pulse..I have also counseled her on smoking cessation.  She has not interested in quitting at this timeFollow-up 3 months after compression therapy or sooner if any new problems ariseCc:  Electronically Signed by Sela Hilding, MD, January 2, 2024Isibor J. Luman Holway MD MPHVascular SurgeonAssistant Professor of Surgery Division of Vascular Surgery & Endovascular [facility], Section of Vascular [address] , [place] [address] [place], [place] [address]BJYNWG: [phone]: [phone] of this note were transcribed using speech recognition software and may contain transcription errors despite my best efforts to minimize any. Please do not hesitate to contact me to request clarification if needed.

## 2022-11-09 ENCOUNTER — Encounter
Admit: 2022-11-09 | Payer: PRIVATE HEALTH INSURANCE | Attending: Student in an Organized Health Care Education/Training Program

## 2022-11-09 DIAGNOSIS — M7989 Other specified soft tissue disorders: Secondary | ICD-10-CM

## 2022-11-17 ENCOUNTER — Telehealth
Admit: 2022-11-17 | Payer: PRIVATE HEALTH INSURANCE | Attending: Student in an Organized Health Care Education/Training Program

## 2022-11-17 NOTE — Telephone Encounter
Spoke with patient and made her aware Lynnette from [employer] states they are running the patient's insurance and will not know if it is covered until Monday, 1/15.  Lynnette states the patient should hear from her by the end of the Monday's business day.  Patient states she checked with her own insurance company and knows they will not cover the cost.  Patient made aware she is welcome to purchase them OOP through [employer] however it is usually cheaper to purchase online.  Patient states understanding and will go online to purchase as she prefers the cheaper option.  11/17/22 Patient Message:This patient called, that the company [employer] has not responded to her 3 or 4 calls to them for compression stockings, pt call back # [phone], thank you!

## 2023-02-06 ENCOUNTER — Encounter
Admit: 2023-02-06 | Payer: PRIVATE HEALTH INSURANCE | Attending: Student in an Organized Health Care Education/Training Program

## 2023-02-20 ENCOUNTER — Encounter
Admit: 2023-02-20 | Payer: PRIVATE HEALTH INSURANCE | Attending: Student in an Organized Health Care Education/Training Program

## 2023-02-20 DIAGNOSIS — I499 Cardiac arrhythmia, unspecified: Secondary | ICD-10-CM

## 2023-02-20 DIAGNOSIS — K635 Polyp of colon: Secondary | ICD-10-CM

## 2023-02-20 DIAGNOSIS — M7989 Other specified soft tissue disorders: Secondary | ICD-10-CM

## 2023-02-20 DIAGNOSIS — F32A Depression: Secondary | ICD-10-CM

## 2023-02-20 DIAGNOSIS — K5900 Constipation, unspecified: Secondary | ICD-10-CM

## 2023-02-20 DIAGNOSIS — E785 Hyperlipidemia, unspecified: Secondary | ICD-10-CM

## 2023-02-20 MED ORDER — BUPROPION HCL SR 100 MG TABLET,12 HR SUSTAINED-RELEASE
100 mg | Status: AC
Start: 2023-02-20 — End: ?

## 2023-02-20 NOTE — Progress Notes
[facility] Office NoteSubjective: Chief Complaint:Bilateral toe discolorationHPI:Holly Erickson is a 82 y.o. female patient with a history of dyslipidemia and active tobacco abuse who presents on recommendation of her PCP for discoloration of the toes on bilateral feet left greater than right. She denies claudication, rest pain or nonhealing wounds.  She reports some foot swelling which is worse at the end of the day.  She has a history of bilateral knee replacement but no arterial or venous interventions.  Of note the foot discolorations are not associated with any pain.  She does not know how long they have been there for and claims her attention was only drawn to it by PCP.She reports significant improvement in her lower extremity symptoms since her last visit and with consistent use of compression socks.  She is happyPast Medical History: Diagnosis Date  Arrhythmia   Colon polyp   Constipation   Depression   Hyperlipidemia  Past Surgical History: Procedure Laterality Date  APPENDECTOMY    BACK SURGERY    COLONOSCOPY  03/11/2018  [facility]  HERNIA REPAIR    HYSTERECTOMY    OOPHORECTOMY Bilateral   PARATHYROIDECTOMY    REPLACEMENT TOTAL HIP W/  RESURFACING IMPLANTS Left   REPLACEMENT TOTAL KNEE BILATERAL    RHINOPLASTY    ROTATOR CUFF REPAIR Right   TONSILLECTOMY   Social History:She  reports that she has been smoking. She does not have any smokeless tobacco history on file. She reports that she does not currently use alcohol. No history on file for drug use.Medications:Current Outpatient Medications Medication Sig Dispense Refill  ascorbic acid (VITAMIN C ORAL) Take by mouth daily.    aspirin 81 mg EC delayed release tablet Take 1 tablet (81 mg total) by mouth daily.    buPROPion SR (WELLBUTRIN SR) 100 mg 12 hr tablet 1 tab(s) orally once a day for 30 days    DULoxetine (CYMBALTA) 60 MG capsule Take 1 capsule (60 mg total) by mouth daily.    enzymes,digestive (DIGESTIVE ENZYMES ORAL) Take by mouth daily.    gabapentin (NEURONTIN) 300 mg capsule Take 1 capsule (300 mg total) by mouth.    melatonin 5 mg tablet Take 1 tablet (5 mg total) by mouth nightly.    multivitamin capsule Take 1 capsule by mouth daily.    rosuvastatin (CRESTOR) 20 MG tablet Take 1 tablet (20 mg total) by mouth daily.    zinc gluconate 50 mg tablet Take 1 tablet (50 mg total) by mouth daily.   No current facility-administered medications for this visit. Allergies:Allergies Allergen Reactions  Sodium Pentothal [Thiopental Sodium]    Childhood  Duragesic [Fentanyl] Itching and Dizziness   PATCH ONLY.. Oral form is ok Blood pressure (P) 120/60, pulse (!) (P) 91, height 5' (1.524 m), weight 50.3 kg, SpO2 (P) 96 %.Review of Systems:ROSObjective: Physical ExamGeneral: AAOx3, not in acute distressHEENT: Normocephalic, atraumaticCV: regular pulsePulm: breathing calmly in room airAbd: Soft, non tenderExtremities: BLE: Palpable femoral, DP/PT. No wounds.  Dependent rubor in the toes improved with elevationLabs:Lab Results Component Value Date  BUN 19 07/12/2018 Lab Results Component Value Date  CREATININE 0.76 07/12/2018 Lab Results Component Value Date  K 4.8 07/12/2018 Lab Results Component Value Date  WBC 7.2 07/12/2018  HGB 13.9 07/12/2018  HCT 41.6 07/12/2018  MCV 95.9 (H) 07/12/2018  PLT 197 07/12/2018 No results found for: INR, PROTIME, No results found for: PTTDiagnostics:ABI/TP 1/2/24FINDINGS: Multiphasic waveforms seen in the bilateral posterior tibial and dorsalis pedis arteries.  The right ABI is 1.01.  The left ABI is 0.93.   Right:Brachial - 152 mmHgPTA - 155 mmHgDPA - 150 mmHg Left:Brachial - 139 mmHgPTA - 142 mmHgDPA - 128 mmHg  IMPRESSION: No evidence of right lower extremity arterial ischemia at rest by ABI criteria.Mild left lower extremity arterial ischemia at rest by ABI criteria. Impression/PlanA 73-year-old lady with a history of dyslipidemia, active tobacco abuse with venous congestion of the toes in bilateral feetContinue graded compression and elevationFollow up p.r.n.Cc:  Electronically Signed by Sela Hilding, MD, April 16, 2024Isibor J. Jaegar Croft MD MPHVascular SurgeonAssistant Professor of Surgery Division of Vascular Surgery & Endovascular [facility], Section of Vascular [address], [address] , [place] [address] [place], [place] [address]GLOVFI: [phone]: [phone] of this note were transcribed using speech recognition software and may contain transcription errors despite my best efforts to minimize any. Please do not hesitate to contact me to request clarification if needed.

## 2023-04-09 ENCOUNTER — Encounter: Admit: 2023-04-09 | Payer: PRIVATE HEALTH INSURANCE

## 2023-04-09 ENCOUNTER — Ambulatory Visit
Admit: 2023-04-09 | Payer: PRIVATE HEALTH INSURANCE | Attending: Student in an Organized Health Care Education/Training Program

## 2023-04-09 DIAGNOSIS — L219 Seborrheic dermatitis, unspecified: Secondary | ICD-10-CM

## 2023-04-09 DIAGNOSIS — D1801 Hemangioma of skin and subcutaneous tissue: Secondary | ICD-10-CM

## 2023-04-09 DIAGNOSIS — L814 Other melanin hyperpigmentation: Secondary | ICD-10-CM

## 2023-04-09 DIAGNOSIS — D692 Other nonthrombocytopenic purpura: Secondary | ICD-10-CM

## 2023-04-09 DIAGNOSIS — D229 Melanocytic nevi, unspecified: Secondary | ICD-10-CM

## 2023-04-09 DIAGNOSIS — L299 Pruritus, unspecified: Secondary | ICD-10-CM

## 2023-04-09 MED ORDER — CAMPHOR-MENTHOL 0.5 %-0.5 % LOTION
0.5-0.5 % | 3 refills | Status: AC
Start: 2023-04-09 — End: ?

## 2023-04-09 NOTE — Progress Notes
DERMATOLOGY: NEW PATIENT VISITCC/Reason for consult: LOC shoulder moleReferring provider: Morley Kos, MDHPI: 82 y.o. female referred for above.Pt reports a pruritic LOC on her mid upper back. It has been itchy for weeks.No other lesions that are bleeding, growing, or painful.ROS: new lumps or bumps, or other skin concerns.PMH/PSH: Past Medical History: Diagnosis Date  Arrhythmia   Colon polyp   Constipation   Depression   Hyperlipidemia  Patient Active Problem List Diagnosis  H/O total hip arthroplasty, left Medications:Current Outpatient Medications on File Prior to Visit Medication Sig Dispense Refill  ascorbic acid (VITAMIN C ORAL) Take by mouth daily.    aspirin 81 mg EC delayed release tablet Take 1 tablet (81 mg total) by mouth daily.    buPROPion SR (WELLBUTRIN SR) 100 mg 12 hr tablet 1 tab(s) orally once a day for 30 days    DULoxetine (CYMBALTA) 60 MG capsule Take 1 capsule (60 mg total) by mouth daily.    enzymes,digestive (DIGESTIVE ENZYMES ORAL) Take by mouth daily.    gabapentin (NEURONTIN) 300 mg capsule Take 1 capsule (300 mg total) by mouth.    melatonin 5 mg tablet Take 1 tablet (5 mg total) by mouth nightly.    multivitamin capsule Take 1 capsule by mouth daily.    rosuvastatin (CRESTOR) 20 MG tablet Take 1 tablet (20 mg total) by mouth daily.    zinc gluconate 50 mg tablet Take 1 tablet (50 mg total) by mouth daily.   No current facility-administered medications on file prior to visit. Allergies: Allergies Allergen Reactions  Sodium Pentothal [Thiopental Sodium]    Childhood  Duragesic [Fentanyl] Itching and Dizziness   PATCH ONLY.. Oral form is ok Chart reviewed and updated as appropriate to include PMH, social history, family history, allergies, and current medications.ZO:XWRUEAV: well appearing, alert, no acute distressPsych/Neuro: Alert and oriented x 3, normal affect, pleasantSkin:Face: examinedNeck: examinedChest/axilla/breast: examinedAbdomen: examinedBack: examinedRUE: examinedLUE: examinedButtocks: examinedSkin exam notable for:- back clear of rash on exam- back with scattered stuck on waxy papules/plaques c/w SKs with overlying linear angulated erosions c/w excoriations- bilateral forearms with purple macules and patches c/w solar purpura- photodistributed tan macules c/w lentigines- scattered brown and skin-colored macules and papules c/w benign nevi- scattered red papules c/w cherry angiomas- scattered stuck on waxy papules/plaques c/w SKs Patient exam or treatment required medical chaperone.The sensitive parts of the examination were performed with chaperone present: Yes; Chaperone Name, Role/Title: Rob Hickman, LPNAssessment and Plan:#Pruritus, localized to over seborrheic keratoses- diagnosis and benign nature discussed, reassurance provided- Start Sarna lotion (camphor-menthol 0.5%-0.5%) - leave in refrigerator and apply as needed for acute itch - Pramoxine cream as needed, may store in refrigerator (e.g. Cerave anti-itch cream)#Seborrheic keratoses- diagnosis and benign nature discussed, reassurance provided#Solar purpura/ actinic purpura - Discussed this is a type of bruise that occurs in sun-damaged skin due to the thinning of the subcutis and resulting fragility in the blood vessels. - The purple to brown discoloration can remain for weeks to months- Solar purpura can be exacerbated by topical and oral corticosteroids and by blood thinners#Lentigines- diagnosis and benign nature discussed, reassurance provided#Nevi- diagnosis and benign nature discussed, reassurance provided- patient will monitor lesions and instructed to call for any changes or symptoms #Cherry angiomas - diagnosis and benign nature discussed, reassurance provided#Upper body skin cancer screening - no personal history of skin cancer- No lesions concerning for MM or NMSC on exam today- discussed features of MM and NMSC and patient was instructed to return with any changing, hurting, bleeding or growing lesions. - discussed  sun avoidance and protection strategies, handout provided - recommend OTC sunscreen with SPF 30+Contact: Telephone preferredRTC: Return if symptoms worsen or fail to improve.Kathlen Mody, MDY[facility] Scribed for Lewayne Bunting, MD by Jerel Shepherd, medical scribe April 09, 2023  The documentation recorded by the scribe accurately reflects the services I personally performed and the decisions made by me. I reviewed and confirmed all material entered and/or pre-charted by the scribe.

## 2023-04-09 NOTE — Patient Instructions
For itch (keep refrigerated for best effect, use as much as needed)- Sarna lotion (camphor 0.5% menthol 0.5%), prescription sent, if not covered by insurance try over-the-counter (OTC) alternatives listed below:- Sarna sensitive lotion (pramoxine 1%)- CeraVe anti-itch moisturizing cream (pramoxine 1%)-------------------------------------------------------------------------------------------------------------------------------------[facility]  Holly Erickson , MD[address], [place] [phone] Protection RecommendationsUse a daily sunscreen that is labeled as broad-spectrum which covers both UVA and UVB sun rays. The sunscreen should be a least SPF 30.Reapply the sunscreen every 2 hours when outdoors.Use at least one fluid ounce (one shot glass full) of sunscreen for every application to cover all sun-exposed areas.Don't forget to apply sunscreen to your face, ears, and neck, as well as your arms and legs if they are not covered by clothing.Seek shade between 10 a.m. and 4 p.m., which are the peak hours of UVB exposure.Wear lightweight long-sleeved shirts and pants when possible. There are now numerous clothing lines that contain SPF protection built into the clothing.Wear a wide-brimmed hat and sunglasses whenever possible.Sunscreens can contain chemical sunscreens or physical blockers. Physical blockers include titanium dioxide and zinc oxide. Look at the ingredients on the bottle label to see which type of sunscreen is contained in that bottle.If using spray sunscreen, be sure that you are applying an even layer on all sun-exposed skin and rub in the sunscreen after spraying.Perform self skin-examinations once a month to look for any new or changing lesions. If there is any concern, call our dermatology clinic to make an appointment for an exam.Do NOT use tanning beds which increase risk of skin cancer.* Patients with a history of sun damage or prior history of skin cancer are at increased risk for developing new skin cancers or precancerous skin changes in the future and should undergo routine skin examinations by a Dermatologist. In addition, self-skin examinations are recommended as outlined below:How to examine your skin for potential skin cancersWe are suggesting that you perform a monthly skin cancer check. Check your skin for any new spots which are changing in color, size, or shape.We suggest the following steps to systematically check your skin: It is helpful to use a full length mirror and a hand mirror to look at your skin.Examine your face, especially the nose, lips, mouth, and ears - front and back. Check your hands carefully: palms and backs, between the fingers and under the fingernails. Continue up your wrists to examine both front and back of your forearms.Then begin at your elbows and scan all sides of your upper arms and underarms.Next focus on the neck, chest, and trunk. Women should lift their breasts to view the underside.With your back to the full-length mirror, use a hand mirror to inspect the back of your neck, shoulders, upper back, and back of your arms.Still using both mirrors, scan your lower back, buttocks, and backs of both legs.Sit down; prop each leg in turn on the other stool or chair. Check front and sides of both legs, thigh to shin, ankles, tops of feet, between toes and under toenails. Examine soles of feet and heels.Adapted from: http://www.skincancer.org/skin-cancer-information/early-detection/step-by-step-self-examinationMoles (melanocytic nevi, beauty marks)These are collections of pigment cells in the skin. They can be flat or elevated.They are stimulated to grow by sunlight and tanning beds.Melanoma is a cancerous form of a mole that can kill. There are over 8,000 new cases every year in the [place]To distinguish a possible melanoma from a mole use the ABCDE rules:A - Asymmetry - Most moles are symmetric (they look the same on both sides), melanomas do notB -   Border irregularity - Most moles have a smooth border, melanomas may have an irregular borderC - Color variegation - Most moles have one or two colors which are symmetric (e.g. darker center lighter around the edges), melanomas may 	have several irregularly placed colors (e.g. brown, black, red, white)D - Diameter - Most moles are less than 6 mm (size of a pencil eraser), melanomas can be larger (or smaller)E - Evolution - Melanoma can grow rapidly so watch out for any rapidly growing spot, even ones without brown color (some melanomas are skin colored and some are red)The growth of hair within moles is normal, is not concerning and it does not increase the risk of melanoma.

## 2023-05-09 ENCOUNTER — Encounter: Admit: 2023-05-09 | Payer: PRIVATE HEALTH INSURANCE | Attending: Internal Medicine

## 2023-05-09 DIAGNOSIS — H6123 Impacted cerumen, bilateral: Secondary | ICD-10-CM

## 2023-07-29 ENCOUNTER — Inpatient Hospital Stay: Admit: 2023-07-29 | Payer: PRIVATE HEALTH INSURANCE

## 2023-07-29 ENCOUNTER — Encounter: Admit: 2023-07-29 | Payer: PRIVATE HEALTH INSURANCE | Attending: Anesthesiology | Primary: Internal Medicine

## 2023-07-29 ENCOUNTER — Emergency Department: Admit: 2023-07-29 | Payer: PRIVATE HEALTH INSURANCE | Primary: Internal Medicine

## 2023-07-29 ENCOUNTER — Inpatient Hospital Stay
Admit: 2023-07-29 | Discharge: 2023-08-01 | Payer: PRIVATE HEALTH INSURANCE | Attending: Orthopedic Surgery | Admitting: Orthopedic Surgery

## 2023-07-29 DIAGNOSIS — S72031A Displaced midcervical fracture of right femur, initial encounter for closed fracture: Secondary | ICD-10-CM

## 2023-07-29 DIAGNOSIS — F32A Depression: Secondary | ICD-10-CM

## 2023-07-29 DIAGNOSIS — I499 Cardiac arrhythmia, unspecified: Secondary | ICD-10-CM

## 2023-07-29 DIAGNOSIS — E785 Hyperlipidemia, unspecified: Secondary | ICD-10-CM

## 2023-07-29 DIAGNOSIS — K635 Polyp of colon: Secondary | ICD-10-CM

## 2023-07-29 DIAGNOSIS — K5900 Constipation, unspecified: Secondary | ICD-10-CM

## 2023-07-29 LAB — CBC WITH AUTO DIFFERENTIAL
BKR WAM ABSOLUTE IMMATURE GRANULOCYTES.: 0.02 x 1000/ÂµL (ref 0.00–0.30)
BKR WAM ABSOLUTE LYMPHOCYTE COUNT.: 0.49 x 1000/ÂµL — ABNORMAL LOW (ref 0.60–3.70)
BKR WAM ABSOLUTE NRBC (2 DEC): 0 x 1000/ÂµL (ref 0.00–1.00)
BKR WAM ANC (ABSOLUTE NEUTROPHIL COUNT): 4.26 x 1000/ÂµL (ref 2.00–7.60)
BKR WAM BASOPHIL ABSOLUTE COUNT.: 0.03 x 1000/ÂµL (ref 0.00–1.00)
BKR WAM BASOPHILS: 0.6 % (ref 0.0–1.4)
BKR WAM EOSINOPHIL ABSOLUTE COUNT.: 0.08 x 1000/ÂµL (ref 0.00–1.00)
BKR WAM EOSINOPHILS: 1.5 % (ref 0.0–5.0)
BKR WAM HEMATOCRIT (2 DEC): 32.7 % — ABNORMAL LOW (ref 35.00–45.00)
BKR WAM HEMOGLOBIN: 10.8 g/dL — ABNORMAL LOW (ref 11.7–15.5)
BKR WAM IMMATURE GRANULOCYTES: 0.4 % (ref 0.0–1.0)
BKR WAM LYMPHOCYTES: 9.4 % — ABNORMAL LOW (ref 17.0–50.0)
BKR WAM MCH (PG): 31.6 pg (ref 27.0–33.0)
BKR WAM MCHC: 33 g/dL (ref 31.0–36.0)
BKR WAM MCV: 95.6 fL (ref 80.0–100.0)
BKR WAM MONOCYTE ABSOLUTE COUNT.: 0.35 x 1000/ÂµL (ref 0.00–1.00)
BKR WAM MONOCYTES: 6.7 % (ref 4.0–12.0)
BKR WAM MPV: 11.9 fL (ref 8.0–12.0)
BKR WAM NEUTROPHILS: 81.4 % — ABNORMAL HIGH (ref 39.0–72.0)
BKR WAM NUCLEATED RED BLOOD CELLS: 0 % (ref 0.0–1.0)
BKR WAM PLATELETS: 105 x1000/ÂµL — ABNORMAL LOW (ref 150–420)
BKR WAM RDW-CV: 15.2 % — ABNORMAL HIGH (ref 11.0–15.0)
BKR WAM RED BLOOD CELL COUNT.: 3.42 M/ÂµL — ABNORMAL LOW (ref 4.00–6.00)
BKR WAM WHITE BLOOD CELL COUNT: 5.2 x1000/ÂµL (ref 4.0–11.0)

## 2023-07-29 LAB — MSSA / MRSA SCREEN BY PCR   (BH GH LMW YH)
BKR MRSA NARES PCR: NEGATIVE
BKR SAUR NARES PCR: NEGATIVE

## 2023-07-29 LAB — BASIC METABOLIC PANEL
BKR ANION GAP: 8 (ref 7–17)
BKR BLOOD UREA NITROGEN: 13 mg/dL (ref 8–23)
BKR BUN / CREAT RATIO: 14.4 (ref 8.0–23.0)
BKR CALCIUM: 8.9 mg/dL (ref 8.8–10.2)
BKR CHLORIDE: 103 mmol/L (ref 98–107)
BKR CO2: 28 mmol/L (ref 20–30)
BKR CREATININE: 0.9 mg/dL (ref 0.40–1.30)
BKR EGFR, CREATININE (CKD-EPI 2021): >60 mL/min/{1.73_m2} (ref >=60)
BKR GLUCOSE: 103 mg/dL — ABNORMAL HIGH (ref 70–100)
BKR POTASSIUM: 3.8 mmol/L (ref 3.3–5.3)
BKR SODIUM: 139 mmol/L (ref 136–144)

## 2023-07-29 LAB — PARTIAL THROMBOPLASTIN TIME     (BH GH LMW Q YH): BKR PARTIAL THROMBOPLASTIN TIME: 32.9 s — ABNORMAL HIGH (ref 22.5–32.0)

## 2023-07-29 LAB — IRON AND TIBC
BKR IRON SATURATION: 9 % — ABNORMAL LOW (ref 15–50)
BKR IRON: 33 ug/dL — ABNORMAL LOW (ref 37–145)
BKR TOTAL IRON BINDING CAPACITY: 356 ug/dL (ref 250–450)

## 2023-07-29 LAB — MAGNESIUM: BKR MAGNESIUM: 1.9 mg/dL (ref 1.7–2.4)

## 2023-07-29 LAB — TROPONIN T HIGH SENSITIVITY, 3 HOUR (BH GH LMW YH)
BKR TROPONIN T HS 1 HOUR DELTA FROM 0 HOUR ON 3HR: 0 ng/L
BKR TROPONIN T HS 3 HOUR DELTA FROM 0 HOUR: 0 ng/L
BKR TROPONIN T HS 3 HOUR: 12 ng/L — ABNORMAL HIGH

## 2023-07-29 LAB — TROPONIN T HIGH SENSITIVITY, 1 HOUR WITH REFLEX (BH GH LMW YH)
BKR TROPONIN T HS 1 HOUR DELTA FROM 0 HOUR: 0 ng/L
BKR TROPONIN T HS 1 HOUR: 12 ng/L — ABNORMAL HIGH

## 2023-07-29 LAB — VITAMIN B12: BKR VITAMIN B12: 489 pg/mL (ref 232–1245)

## 2023-07-29 LAB — PROTIME AND INR
BKR INR: 1.18 — ABNORMAL HIGH (ref 0.88–1.14)
BKR PROTHROMBIN TIME: 12.5 s — ABNORMAL HIGH (ref 9.5–12.1)

## 2023-07-29 LAB — TROPONIN T HIGH SENSITIVITY, 0 HOUR BASELINE WITH REFLEX (BH GH LMW YH): BKR TROPONIN T HS 0 HOUR BASELINE: 12 ng/L — ABNORMAL HIGH

## 2023-07-29 LAB — TSH W/REFLEX TO FT4     (BH GH LMW Q YH): BKR THYROID STIMULATING HORMONE: 1.41 u[IU]/mL

## 2023-07-29 MED ORDER — IOHEXOL 350 MG IODINE/ML INTRAVENOUS SOLUTION
350 | Freq: Once | INTRAVENOUS | Status: CP | PRN
Start: 2023-07-29 — End: ?
  Administered 2023-07-29: 22:00:00 350 mL via INTRAVENOUS

## 2023-07-29 MED ORDER — GABAPENTIN 100 MG CAPSULE
100 | ORAL | Status: DC
Start: 2023-07-29 — End: 2023-07-29

## 2023-07-29 MED ORDER — TRANEXAMIC ACID (CYKLOKAPRON) IVPB (ORTHOPEDIC) MBP
Freq: Once | INTRAVENOUS | Status: CP
Start: 2023-07-29 — End: ?
  Administered 2023-07-29: 21:00:00 100.000 mL/h via INTRAVENOUS

## 2023-07-29 MED ORDER — BISACODYL 5 MG TABLET,DELAYED RELEASE
5 | Freq: Every day | ORAL | Status: DC | PRN
Start: 2023-07-29 — End: 2023-08-01
  Administered 2023-07-31: 21:00:00 5 mg via ORAL

## 2023-07-29 MED ORDER — ZINC SULFATE 50 MG ZINC (220 MG) CAPSULE
22050 mg (50 mg zinc) | Freq: Every day | ORAL | Status: DC
Start: 2023-07-29 — End: 2023-08-01
  Administered 2023-07-30 – 2023-08-01 (×3): 220 mg (50 mg zinc) via ORAL

## 2023-07-29 MED ORDER — OXYCODONE (ROXICODONE) IMMEDIATE RELEASE 2.5 MG HALFTAB
2.5 | ORAL | Status: DC | PRN
Start: 2023-07-29 — End: 2023-07-31

## 2023-07-29 MED ORDER — TRANEXAMIC ACID (CYKLOKAPRON) IVPB (ORTHOPEDIC) MBP
Freq: Once | INTRAVENOUS | Status: DC
Start: 2023-07-29 — End: 2023-07-31

## 2023-07-29 MED ORDER — OXYCODONE IMMEDIATE RELEASE 5 MG TABLET
5 | ORAL | Status: DC | PRN
Start: 2023-07-29 — End: 2023-07-31
  Administered 2023-07-30 (×3): 5 mg via ORAL

## 2023-07-29 MED ORDER — ONDANSETRON HCL (PF) 4 MG/2 ML INJECTION SOLUTION
4 | Freq: Four times a day (QID) | INTRAVENOUS | Status: DC | PRN
Start: 2023-07-29 — End: 2023-07-31

## 2023-07-29 MED ORDER — GABAPENTIN 100 MG CAPSULE
100 | Freq: Two times a day (BID) | ORAL | Status: AC
Start: 2023-07-29 — End: ?

## 2023-07-29 MED ORDER — ASPIRIN 81 MG TABLET,DELAYED RELEASE
81 | ORAL | Status: DC
Start: 2023-07-29 — End: 2023-07-31

## 2023-07-29 MED ORDER — POLYETHYLENE GLYCOL 3350 17 GRAM ORAL POWDER PACKET
17 | Freq: Every day | ORAL | Status: DC
Start: 2023-07-29 — End: 2023-07-30

## 2023-07-29 MED ORDER — ROSUVASTATIN 20 MG TABLET
20 mg | Freq: Every day | ORAL | Status: DC
Start: 2023-07-29 — End: 2023-08-01
  Administered 2023-07-30 – 2023-08-01 (×3): 20 mg via ORAL

## 2023-07-29 MED ORDER — SODIUM CHLORIDE 0.9 % (FLUSH) INJECTION SYRINGE
0.9 % | Freq: Three times a day (TID) | INTRAVENOUS | Status: DC
Start: 2023-07-29 — End: 2023-08-01
  Administered 2023-07-29 – 2023-08-01 (×8): 0.9 mL via INTRAVENOUS

## 2023-07-29 MED ORDER — MORPHINE 2 MG/ML INJECTION SYRINGE
2 | Freq: Once | SUBCUTANEOUS | Status: CP
Start: 2023-07-29 — End: ?
  Administered 2023-07-29: 14:00:00 2 mL via SUBCUTANEOUS

## 2023-07-29 MED ORDER — HEPARIN (PORCINE) 5,000 UNIT/ML INJECTION SOLUTION
5000 | Freq: Three times a day (TID) | SUBCUTANEOUS | Status: CP
Start: 2023-07-29 — End: ?
  Administered 2023-07-29 – 2023-07-30 (×2): 5000 mL via SUBCUTANEOUS

## 2023-07-29 MED ORDER — GABAPENTIN 300 MG CAPSULE
300 mg | Freq: Every evening | ORAL | Status: DC
Start: 2023-07-29 — End: 2023-08-01
  Administered 2023-07-30 – 2023-08-01 (×2): 300 mg via ORAL

## 2023-07-29 MED ORDER — MELATONIN 3 MG TABLET
3 mg | Freq: Every evening | ORAL | Status: DC
Start: 2023-07-29 — End: 2023-08-01
  Administered 2023-07-30 – 2023-08-01 (×2): 3 mg via ORAL

## 2023-07-29 MED ORDER — SODIUM CHLORIDE 0.9 % BOLUS (NEW BAG)
0.9 | Freq: Once | INTRAVENOUS | Status: CP
Start: 2023-07-29 — End: ?
  Administered 2023-07-29: 17:00:00 0.9 mL/h via INTRAVENOUS

## 2023-07-29 MED ORDER — BISACODYL 10 MG RECTAL SUPPOSITORY
10 | Freq: Every day | RECTAL | Status: DC | PRN
Start: 2023-07-29 — End: 2023-07-31

## 2023-07-29 MED ORDER — MORPHINE 2 MG/ML INJECTION SYRINGE
2 | INTRAVENOUS | Status: DC | PRN
Start: 2023-07-29 — End: 2023-07-31

## 2023-07-29 MED ORDER — DULOXETINE 30 MG CAPSULE,DELAYED RELEASE
30 mg | Freq: Every day | ORAL | Status: DC
Start: 2023-07-29 — End: 2023-08-01
  Administered 2023-07-30 – 2023-08-01 (×3): 30 mg via ORAL

## 2023-07-29 MED ORDER — BUPROPION HCL XL 150 MG 24 HR TABLET, EXTENDED RELEASE
150 mg | Freq: Every day | ORAL | Status: DC
Start: 2023-07-29 — End: 2023-08-01
  Administered 2023-07-30 – 2023-08-01 (×3): 150 mg via ORAL

## 2023-07-29 MED ORDER — SODIUM CHLORIDE 0.9 % LARGE VOLUME SYRINGE FOR AUTOINJECTOR
Freq: Once | INTRAVENOUS | Status: CP | PRN
Start: 2023-07-29 — End: ?
  Administered 2023-07-29: 22:00:00 via INTRAVENOUS

## 2023-07-29 MED ORDER — TRANEXAMIC ACID (CYKLOKAPRON) IVPB (ORTHOPEDIC) MBP
Freq: Once | INTRAVENOUS | Status: CP
Start: 2023-07-29 — End: ?
  Administered 2023-07-29: 22:00:00 100.000 mL/h via INTRAVENOUS

## 2023-07-29 MED ORDER — SODIUM CHLORIDE 0.9 % INTRAVENOUS SOLUTION
INTRAVENOUS | Status: DC
Start: 2023-07-29 — End: 2023-07-30
  Administered 2023-07-29 – 2023-07-30 (×2): via INTRAVENOUS

## 2023-07-29 MED ORDER — SENNOSIDES 8.6 MG TABLET
8.6 | Freq: Every evening | ORAL | Status: DC
Start: 2023-07-29 — End: 2023-07-30

## 2023-07-29 MED ORDER — TRANEXAMIC ACID 1,000 MG/10 ML (100 MG/ML) INTRAVENOUS SOLUTION
1000 mg/10 mL (100 mg/mL) | Status: DC
Start: 2023-07-29 — End: 2023-08-01

## 2023-07-29 MED ORDER — MORPHINE 2 MG/ML INJECTION SYRINGE
2 | Freq: Once | SUBCUTANEOUS | Status: CP
Start: 2023-07-29 — End: ?
  Administered 2023-07-29: 13:00:00 2 mL via SUBCUTANEOUS

## 2023-07-29 MED ORDER — SODIUM CHLORIDE 0.9 % (FLUSH) INJECTION SYRINGE
0.9 % | INTRAVENOUS | Status: DC | PRN
Start: 2023-07-29 — End: 2023-08-01

## 2023-07-29 MED ORDER — ONDANSETRON 4 MG DISINTEGRATING TABLET
4 | Freq: Four times a day (QID) | Status: DC | PRN
Start: 2023-07-29 — End: 2023-07-31

## 2023-07-29 NOTE — ED Notes
 7:47 AM pt presents to ed via ems for fall. Pt reports she woke up at 0300 and was thirsty so she walked to the kitchen for water. Pt reports she was not dizzy but she thinks she was just so sleepy and lost her balance. Pt reports she fell of standing landing on her right hip. Pt denies any head strike or loc. Pt does not take blood thinning medications. Pt is aox4, speaking full clear sentences, respirations non-labored, skin is warm and try strong pulses noted, no edema. Ecchymosis to left lower leg. Pts abd is soft and non-distended. Pt moving all extremities spontaneously but has very limited movement of right leg due to pain. At rest pts pain is tolerable but with movement pts right hip pain is a 10/10. Pt o2 sat 88% on room air, pt placed on 2 liters nc, pt is a smoker but denies any copd diagnosis. Pt waiting for provider evaluation at this time. 9:50 AMPt reports her pain has not improved after morphine, pa ryan aware.11:15 AMPt updated on plan of care for ortho consult, pt reports pain is well controled at a 2/10. Pt has no needs at this time.12:13 PMReport to lynette in pacu1:39 PMPlan is now for surgery tomorrow, pt is aware of change in plan. Pt reports she is pain free at this time. Pt has no needs.4:12 PMRn spoke with orto pa diaz-patterson to confirm txa order, pt to get 1 bolus dose of 1g followed by another over 8 hours, med request sent to pharmacy, medication was not in ed.4:44 PMPt provided dinner

## 2023-07-29 NOTE — Other
 [facility]-CONSULT  REQUEST  [facility] NOTE-Type of consult: [facility] Fracture Hospitalist Hip/Spine -New Consult: [id number] Lendon Collar / Location: RM-B8/B08 / Reason for Consult? hip fracture co-management and risk stratification. Trying to go to OR today/Callback Cell Phone: [phone] / Use .consultnotetemplate or add .consultrecommendation to your consult notes. Remind your attending to use .consultattestation /Please confirm receipt of this message by texting back ?OK?-1 - Mobile Heartbeat message sent to [place], A at 11:19 AM. Received response at 11:20.-Haroun Cotham Justice Deeds, PCT9/22/202411:18 [employer] [phone]

## 2023-07-29 NOTE — Utilization Review (ED)
 UM Status: Managed Medicare IPRight hip fx - plan for ortho's consult, hospitalist consulted re: pre-op clearance and co-management.

## 2023-07-29 NOTE — Other
 [facility]		Medicine Consult Note Patient Data:  Patient Name: Holly Erickson Age: 82 y.o. DOB: 1940/11/29	 MRN: [id number]	 Consult Information: Consultation requested by: Mickel Baas, MDReason for consultation: Pre-op clearance, co-managementSource of Information: Patient, Family, and EMR/Previous RecordPresentation History: HPI Pt is a 82 y.o woman with pmh of ch constipation, MDD, HLD, arrythmia, tremors, ch smoking, ? AAA, who p/w c/o rt hip pain s/p fall. Found to have rt hip fx. Medicine consulted for pre-op clearance and co-management. Pt says she woke up last night to get some water, was half sleep while walking when she might have felt bit dizzy and then fell down hitting her rt side. Denies any LOC or head strike. Unable to get up so brought to ed. Of note, pt moved from [place] 5 years ago. Says she was dx with AAA there for which she was getting yearly f/u (unable to see in care everywhere). Last follow up was here in [place] with ? Vascular sx and she was told that AAA is stable at 4.1 cm. Denies any cp, dizziness, sob/doe/orthopnea, focal weakness/numbness, dysuria, etc.Review of Allergies/Medical History/Medications: I have reviewed the patient's allergies, prior to admission meds, past medical/surgical, family and social hx. Allergies is allergic to sodium pentothal [thiopental sodium] and duragesic [fentanyl].Past Medical History  has a past medical history of Arrhythmia, Colon polyp, Constipation, Depression, and Hyperlipidemia.Past Surgical History  has a past surgical history that includes Colonoscopy (03/11/2018); Hernia repair; Tonsillectomy; Replacement total knee bilateral; Back surgery; Rotator cuff repair (Right); Parathyroidectomy; Hysterectomy; Oophorectomy (Bilateral); rhinoplasty; Appendectomy; and Replacement total hip w/  resurfacing implants (Left).Family History family history includes Coronary Artery Disease in her father and mother; Ulcers in her father.Social History  reports that she has been smoking. She does not have any smokeless tobacco history on file. She reports that she does not currently use alcohol. No history on file for drug use.Prior to Admission Medications (Not in a hospital admission) Inpatient Medications:I have reviewed the patient's current medication orders.Current Facility-Administered Medications Medication Dose Route Frequency Provider Last Rate Last Admin  [Held by provider] aspirin EC delayed release tablet 81 mg  81 mg Oral Daily Diaz-Patterson, [place], [place] ON 07/30/2023] buPROPion XL (WELLBUTRIN XL) 24 hr tablet 150 mg  150 mg Oral Daily Diaz-Patterson, [place], [place] ON 07/30/2023] DULoxetine (CYMBALTA) DR capsule 60 mg  60 mg Oral Daily Diaz-Patterson, Quianna, PA      gabapentin (NEURONTIN) capsule 300 mg  300 mg Oral Nightly Diaz-Patterson, Quianna, PA      melatonin tablet 6 mg  6 mg Oral Nightly Diaz-Patterson, Quianna, PA      polyethylene glycol (MIRALAX) packet 17 g  17 g Oral Daily Diaz-Patterson, [place], [place] ON 07/30/2023] rosuvastatin (CRESTOR) tablet 20 mg  20 mg Oral Daily Diaz-Patterson, Quianna, PA      senna (SENOKOT) tablet 17.2 mg  2 tablet Oral Nightly Diaz-Patterson, Quianna, PA      sodium chloride 0.9 % flush 3 mL  3 mL IV Push Q8H Diaz-Patterson, [place], [place]      [START ON 07/30/2023] zinc sulfate (ZINCATE) capsule 220 mg  220 mg Oral Daily Diaz-Patterson, Quianna, PA       sodium chloride 100 mL/hr (07/29/23 1139) bisacodyL, bisacodyL, morphine (ADULT), morphine (ADULT), ondansetron (ZOFRAN) IV Push, ondansetron, oxyCODONE, oxyCODONE, sodium chlorideCurrent Facility-Administered Medications Medication Dose Route Frequency Last Rate  [Held by provider] aspirin  81 mg Oral Daily  bisacodyL  5 mg Oral DAILY PRN    bisacodyL  10 mg Rectal DAILY PRN    [START ON 07/30/2023] buPROPion XL  150 mg Oral Daily    [START ON 07/30/2023] DULoxetine  60 mg Oral Daily    gabapentin  300 mg Oral Nightly    melatonin  6 mg Oral Nightly    morphine (ADULT)  1 mg IV Push Q2H PRN    morphine (ADULT)  2 mg IV Push Q2H PRN    ondansetron (ZOFRAN) IV Push  4 mg IV Push Q6H PRN    ondansetron  4 mg Translingual Q6H PRN    oxyCODONE  2.5 mg Oral Q3H PRN    oxyCODONE  5 mg Oral Q3H PRN    polyethylene glycol  17 g Oral Daily    [START ON 07/30/2023] rosuvastatin  20 mg Oral Daily    senna  2 tablet Oral Nightly    sodium chloride  3 mL IV Push Q8H    sodium chloride  3 mL IV Push PRN for Line Care    sodium chloride  100 mL/hr Intravenous Continuous 100 mL/hr (07/29/23 1139)  [START ON 07/30/2023] zinc sulfate  220 mg Oral Daily   Review of Systems: Review of Systems Constitutional: Negative.  HENT: Negative.   Respiratory: Negative.   Cardiovascular: Negative.  Gastrointestinal: Negative.  Genitourinary: Negative.  Musculoskeletal:  Positive for gait problem. Skin: Negative.  Neurological:  Positive for dizziness and tremors. Hematological: Negative.  Psychiatric/Behavioral: Negative.    Physical Exam: Vitals:I have reviewed the patient's current vital signs as documented in the patient's EMR.  Last 24 hours: Temp:  [97.7 ?F (36.5 ?C)-97.8 ?F (36.6 ?C)] 97.8 ?F (36.6 ?C)Pulse:  [75-79] 75Resp:  [16-18] 16BP: (146-156)/(58-81) 146/58SpO2:  [95 %-98 %] 98 %Most recent: Patient Vitals for the past 24 hrs: BP Temp Temp src Pulse Resp SpO2 07/29/23 1010 (!) 146/58 97.8 ?F (36.6 ?C) Temporal 75 16 98 % 07/29/23 0815 -- -- -- -- -- 96 % 07/29/23 0726 (!) 156/81 97.7 ?F (36.5 ?C) Oral 79 18 95 % Intake/Output:I have reviewed the patient's current I&O's as documented in the EMR., No intake/output data recorded., and No intake or output data in the 24 hours ending 07/29/23 1205Physical ExamVitals and nursing note reviewed. Constitutional:     Appearance: Normal appearance. HENT:    Head: Normocephalic.    Nose: Nose normal.    Mouth/Throat:    Mouth: Mucous membranes are dry. Eyes:    Extraocular Movements: Extraocular movements intact. Cardiovascular:    Rate and Rhythm: Normal rate. Pulmonary:    Effort: Pulmonary effort is normal.    Breath sounds: Normal breath sounds. Abdominal:    Palpations: Abdomen is soft.    Tenderness: There is no abdominal tenderness. Musculoskeletal:       General: Normal range of motion.    Cervical back: Normal range of motion. Skin:   General: Skin is warm. Neurological:    General: No focal deficit present.    Mental Status: She is alert and oriented to person, place, and time.    Cranial Nerves: No cranial nerve deficit. Psychiatric:       Mood and Affect: Mood normal.  Review of Labs/Diagnostics: Lab Review:I have reviewed the patient's labs within the last 24 hrs.Last 24 hours: Recent Results (from the past 24 hour(s)) Magnesium  Collection Time: 07/29/23  8:45 AM Result Value Ref Range  Magnesium 1.9 1.7 - 2.4 mg/dL Basic metabolic panel  Collection Time: 07/29/23  8:45 AM Result Value Ref Range  Sodium 139 136 - 144 mmol/L  Potassium 3.8 3.3 - 5.3 mmol/L  Chloride 103 98 - 107 mmol/L  CO2 28 20 - 30 mmol/L  Anion Gap 8 7 - 17  Glucose 103 (H) 70 - 100 mg/dL  BUN 13 8 - 23 mg/dL  Creatinine 1.61 0.96 - 1.30 mg/dL  Calcium 8.9 8.8 - 04.5 mg/dL  BUN/Creatinine Ratio 40.9 8.0 - 23.0  eGFR (Creatinine) >60 >=60 mL/min/1.84m2 CBC auto differential  Collection Time: 07/29/23  8:45 AM Result Value Ref Range  WBC 5.2 4.0 - 11.0 x1000/?L  RBC 3.42 (L) 4.00 - 6.00 M/?L  Hemoglobin 10.8 (L) 11.7 - 15.5 g/dL  Hematocrit 81.19 (L) 14.78 - 45.00 %  MCV 95.6 80.0 - 100.0 fL  MCH 31.6 27.0 - 33.0 pg  MCHC 33.0 31.0 - 36.0 g/dL  RDW-CV 29.5 (H) 62.1 - 15.0 % Platelets 105 (L) 150 - 420 x1000/?L  MPV 11.9 8.0 - 12.0 fL  Neutrophils 81.4 (H) 39.0 - 72.0 %  Lymphocytes 9.4 (L) 17.0 - 50.0 %  Monocytes 6.7 4.0 - 12.0 %  Eosinophils 1.5 0.0 - 5.0 %  Basophil 0.6 0.0 - 1.4 %  Immature Granulocytes 0.4 0.0 - 1.0 %  nRBC 0.0 0.0 - 1.0 %  Absolute Lymphocyte Count 0.49 (L) 0.60 - 3.70 x 1000/?L  Monocyte Absolute Count 0.35 0.00 - 1.00 x 1000/?L  Eosinophil Absolute Count 0.08 0.00 - 1.00 x 1000/?L  Basophil Absolute Count 0.03 0.00 - 1.00 x 1000/?L  Absolute Immature Granulocyte Count 0.02 0.00 - 0.30 x 1000/?L  Absolute nRBC 0.00 0.00 - 1.00 x 1000/?L  ANC (Abs Neutrophil Count) 4.26 2.00 - 7.60 x 1000/?L Troponin T High Sensitivity, Emergency; 0 hour baseline AND 1 hour with reflex (3 hour)  Collection Time: 07/29/23  9:42 AM Result Value Ref Range  High Sensitivity Troponin T 12 (H) See Comment ng/L EKG  Collection Time: 07/29/23  9:46 AM Result Value Ref Range  Heart Rate 75 bpm  QRS Interval 72 ms  QT Interval 388 ms  QTC Interval 433 ms  P Axis 67 deg  QRS Axis -29 deg  T Wave Axis 98 deg  P-R Interval 157 msec  SEVERITY Abnormal ECG severity Troponin T High Sensitivity, 1 Hour With Reflex (BH GH LMW YH)  Collection Time: 07/29/23 11:08 AM Result Value Ref Range  High Sensitivity Troponin T 12 (H) See Comment ng/L  1 hour Delta from 0 Hour, HS-Troponin T 0 ng/L Protime and INR  Collection Time: 07/29/23 11:08 AM Result Value Ref Range  Prothrombin Time 12.5 (H) 9.5 - 12.1 seconds  INR 1.18 (H) 0.88 - 1.14 Partial thromboplastin time     (BH GH LMW Q YH)  Collection Time: 07/29/23 11:08 AM Result Value Ref Range  PTT 32.9 (H) 22.5 - 32.0 seconds Diagnostic Review:Reviewed. Impression: Pt is a 82 y.o woman with pmh of ch constipation, MDD, HLD, arrythmia, tremors, ? AAA, who p/w c/o rt hip pain s/p fall. Found to have rt hip fx. Medicine consulted for pre-op clearance and co-management.In ED; Pt's o2 sats were in high 80's so placed on 1-2L o2 via nc with improvement. PE with AOx4 woman in nad. Labs with thrombocytopenia and mildly elevated trops. EKG with NSR. Imaging with rt hip fx.Recommendations: # Pre-op clearance;- Pt's RCRI score is 0. Her risk for having a peri-cardiac event for this mod risk sx in 3.9%.- given h/o AAA but no documented f/u with  imaging/physician in charts, would recommend getting CTA chest for AAA work up. If present, please get vascular sx eval prior to sx.- given requiring oxygen, could have component of undx COPD. Would recommend weaning o2 for goal between 89-92%. Start on albuterol nebs prn and duo-nebs atc.- can get TTE as part of work up for pre-syncope.- if above mentioned work up unremarkable for ac changes and cleared by vascular sx (if found to have AAA), ok to proceed with surgery.- monitor for post-op cx like bleeding, perforation, delirium, infection, unstable arrythmia, etc.- can c/w aspirin peri-op. If held, please resume post-op asap once deemed safe per primary team.- dvt ppx, bowel regiment and pain control per ortho.# MDD/HLD;- c/w home gabapentin, statin, wellbutrin, cymbalta and melatonin.Medicine will follow. Please call with questions.Patient was evaluated: In personElectronically Signed: Charlett Blake, MD For questions please page: [phone]/22/2024 12:00 PM

## 2023-07-29 NOTE — H&P
 ORTHOPAEDICS & REHABILITATION ADMISSION H & P NOTEPatient name:  Holly Erickson MRN:	  [id number] Date of birth:	  1942/06/11Date of visit:    9/22/2024Attending of record for this patient: Holly Baas, MD   Provider leaving this note:	 Holly Erickson, PAHistory of Present Illness:Holly Erickson is a 82 y.o. female w/ PMHx ascending aorta aneurysms, HTN, depression, HLD, s/p L THA, bilateral; TKA back in [place], T10-L3 Fusion who suffered a mechanical fall today at their home, and was transported to the [facility] Ericks[facility] for evaluation of right hip pain and inability to bear weight.  Patient states that she was going to get a drink of water when she got lightheaded causing her to fall and land on her right side.  Denies head strike or loss of consciousness. Patient denies any head, neck or back injury.  They also denied any chest pain, dizziness, or light headedness prior to the fall.  There was no reported loss of consciousness near syncope.   Pain worse with any ROM.  Patient is at their baseline mental status.  Live alone in a home wiith no stairs to get into home. Her son Holly Erickson lives down the road from her.Ambulates independently without assistive devicesAC use: Aspirin 81mg  with last dose last nightPast Medical History:Past Medical History: Diagnosis Date  Arrhythmia   Colon polyp   Constipation   Depression   Hyperlipidemia  Past Surgical History: Procedure Laterality Date  APPENDECTOMY    BACK SURGERY    COLONOSCOPY  [facility]/11/2018  [facility]  HERNIA REPAIR    HYSTERECTOMY    OOPHORECTOMY Bilateral   PARATHYROIDECTOMY    REPLACEMENT TOTAL HIP W/  RESURFACING IMPLANTS Left   REPLACEMENT TOTAL KNEE BILATERAL    RHINOPLASTY    ROTATOR CUFF REPAIR Right   TONSILLECTOMY   Social History:Social History Socioeconomic History  Marital status: Married   Spouse name: Not on file  Number of children: Not on file  Years of education: Not on file  Highest education level: Not on file Occupational History  Not on file Tobacco Use  Smoking status: Every Day  Smokeless tobacco: Not on file Substance and Sexual Activity  Alcohol use: Not Currently  Drug use: Not on file  Sexual activity: Not on file Other Topics Concern  Not on file Social History Narrative  Not on file Social Determinants of Health Financial Resource Strain: Not on file Food Insecurity: Not on file Transportation Needs: Not on file Physical Activity: Not on file Stress: Not on file Social Connections: Not on file Intimate Partner Violence: Not on file Housing Stability: Not on file Medications:No current facility-administered medications for this encounter.Current Outpatient Medications:   ascorbic acid (VITAMIN C ORAL), Take by mouth daily., Disp: , Rfl:   aspirin 81 mg EC delayed release tablet, Take 1 tablet (81 mg total) by mouth daily., Disp: , Rfl:   buPROPion SR (WELLBUTRIN SR) 100 mg 12 hr tablet, 1 tab(s) orally once a day for 30 days, Disp: , Rfl:   camphor-menthoL (SARNA) 0.5-0.5 % lotion, Apply topically as needed for itching. Keep in fridge., Disp: 222 mL, Rfl: 2  DULoxetine (CYMBALTA) 60 MG capsule, Take 1 capsule (60 mg total) by mouth daily., Disp: , Rfl:   enzymes,digestive (DIGESTIVE ENZYMES ORAL), Take by mouth daily., Disp: , Rfl:   gabapentin (NEURONTIN) 300 mg capsule, Take 1 capsule (300 mg total) by mouth., Disp: , Rfl:   melatonin 5 mg tablet, Take 1 tablet (5 mg total) by mouth nightly., Disp: , Rfl:  multivitamin capsule, Take 1 capsule by mouth daily., Disp: , Rfl:   rosuvastatin (CRESTOR) 20 MG tablet, Take 1 tablet (20 mg total) by mouth daily., Disp: , Rfl:   zinc gluconate 50 mg tablet, Take 1 tablet (50 mg total) by mouth daily., Disp: , Rfl: Allergies:Sodium pentothal [thiopental sodium] and Duragesic [fentanyl]Review of Systems:Review of Systems:Constitutional: Neg for fever or chillsHEENT: Neg for sore throatNeck: neg for neck painLungs: Neg for shortness of breathHeart: Neg for chest pain Abdomen: Neg for abdominal pain, nausea, vomiting or diarrheaGU: Neg for dysuriaMusculoskeletal: POS right hip pain and gait abnormalitiesNeurologic: neg for numbness, tingling, dizziness, or weaknessSkin: neg for abrasion or lacerationLymph: Neg for bruises/bleeds easilyVitals:BP Readings from Last 1 Encounters: 07/29/23 (!) 146/58  Pulse Readings from Last 1 Encounters: 07/29/23 75  Resp Readings from Last 1 Encounters: 07/29/23 16  Temp Readings from Last 1 Encounters: 07/29/23 97.8 ?F (36.6 ?C) (Temporal)  SpO2 Readings from Last 1 Encounters: 07/29/23 98% Physical Exam:General: 	Awake, alert, NAD, mental status at baselineHead: [place]/AT.  ENT:	Atraumatic. Moist mucous membranes.Neck: Trachea Midline.  No vertebral tendernessHeart: 	Regular rate and rhythmLungs: 	Clear to auscultation bilaterallyAbdomen: 	Soft, non-tender, + bowel soundsPelvis: 	Stable, non-tender to palpation, no crepitusRight hip: 	Shortened	Tenderness to palpation over the proximal femur	Tibialis anterior, gastrocnemius, extensor hallucis longus and flexor hallucis longus intact	Unable to straight leg raise due to pain	Pain with any attempted passive hip flexion/extension or internal/external rotation	No ecchymosis	2+ DP/PT pulses	Distal NVI	Skin: Intact.  No ErythemaImaging: Results of imaging independently reviewed by me are:[place] Pelvis wo IV ContrastResult Date: 9/22/2024Subcapital right femoral fracture. [place] [facility] Notify System Classification: Routine. Report initiated by:  Clair Gulling, MD Reported and signed by: Rosey Bath, MD  [facility] Lower Leg 2V LeftResult Date: 9/22/2024Subcapital right femoral fracture. [place] [facility] Notify System Classification: Routine. (accession [id number]), Routine. (accession [id number]) Report initiated by:  Dorina Hoyer, MD Reported and signed by: Rosey Bath, MD  [facility] Radiology and Biomedical Imaging XR Hip RightResult Date: 9/22/2024Subcapital right femoral fracture. [place] [facility] Notify System Classification: Routine. (accession [id number]), Routine. (accession [id number]) Report initiated by:  Dorina Hoyer, MD Reported and signed by: Rosey Bath, MD  [facility] Radiology and Biomedical Imaging [place] Head wo IV ContrastResult Date: 9/22/2024No [place] evidence for acute intracranial pathology. [place] [facility] Notify System Classification: Routine. Reported and signed by: Rosey Bath, MD  [facility] Radiology and Biomedical Imaging  Labs:Lab Results Component Value Date  WBC 5.2 07/29/2023  HGB 10.8 (L) 07/29/2023  HCT 32.70 (L) 07/29/2023  MCV 95.6 07/29/2023  PLT 105 (L) 07/29/2023 Lab Results Component Value Date  BUN 13 07/29/2023 Lab Results Component Value Date  CREATININE 0.90 07/29/2023 Lab Results Component Value Date  NA 139 07/29/2023  K 3.8 07/29/2023  CL 103 07/29/2023  CO2 28 07/29/2023 No results found for: INR, PROTIMEEKG: sinus rhythmCrCl cannot be calculated (Unknown ideal weight.). TXA EligibilityPt is eligible for the full 3-dose protocol as they have no high risk red flags.Code Status: No code but is ok with noninvasive breathing modalities, BP/Rhythm medications, and blood transfusionAssessment:82 y.o. female with a Right subcapital fracture status post mechanical fall.Plan:- Admit to Ortho service, Dr. Shyrl Numbers is a candidate for a femoral nerve block.- Surgery expected to be done on 07/30/2023.- VTE prophylaxis: None at this time. Lovenox post-operatively.  - NPO - Bedrest/non-weight bearing - Medical clearance to be done by the fracture hospitalist	- Recommend Vascular consult, CTA chest, and TTE for evaluation of known ascending aortic aneursyms	- RCRI 0, 3.9% for peri-cardiac event - Pending Vascular consult and clearance- Patient  consented for a R hip ORIF, and scanned into Epic- OR has been booked for the above procedure.- Plan of care will be discussed with Dr. Theressa Stamps.Electronically Signed by Holly Hacker, PA, July 29, 2023

## 2023-07-29 NOTE — Other
 [facility]  REQUEST  [facility] NOTE-Type of consult: [employer] Vascular Surgery -New Consult: [id number] Holly Erickson / Location: RM-B8/B08 / Brief Clinical Question: patient with history of ascending aortic aneursym with her CV doc from [place]. She hasnt been evaluated in a few years. She is pending going to OR for hip fracture fixation. Need clearance for surgery and recs for ascending AA./Callback Cell Phone: [phone] / Use .consultnotetemplate or add .consultrecommendation to your consult notes. Remind your attending to use .consultattestation /Please confirm receipt of this message by texting back ?OK?-2 - Smartweb Page sent to [facility] Vascular Thoracic Surgery [id number] at 12:43 PM.-Alajia Schmelzer Justice Deeds, PCT9/22/202412:43 [employer] [phone]

## 2023-07-29 NOTE — Plan of Care
 Admission Note Nursing Holly Erickson is a 82 y.o. female admitted with a chief complaint of R hip fx. Patient arrived from home.Patient is AOx4. VSS. On 2L [place], no SOB.R PIV - NS at 142ml/hr and TXA infusing.Pt denies pain - nerve block done in ED.Purewick in place, LBM 9/21.R leg +CMS, no numbness/tingling. Pt bed rest.B/L heel PU - wound consult placed. Reg diet. Bed alarm on.Belongings at bedside: glasses, phone, x2 books. Vitals:  07/29/23 1203 07/29/23 1500 07/29/23 1808 07/29/23 1818 BP: (!) 124/51 (!) 123/49 118/65  Pulse: 80 82 77  Resp: 16 16   Temp: 97.7 ?F (36.5 ?C) 98.1 ?F (36.7 ?C) 97.9 ?F (36.6 ?C)  TempSrc: Oral Oral Oral  SpO2: 97% 97% 96%  Weight:    50.2 kg Height:    5' (1.524 m) Oxygen TherapySpO2: 96 %Device (Oxygen Therapy): nasal cannulaOxygen Concentration (%): 2O2 Flow (L/min): 2I have reviewed the patient's current medication orders..I have reviewed patient valuables Belongings charted in last 7 days: Patient Valuables   Patient Valuables Flowsheet                    PATIENT VALUABLE(S)       Cell phone disposition At bedside/locker/closet 07/29/23 1911  Denture Disposition At bedside/locker/closet 07/29/23 1911   Other disposition At bedside/locker/closet  books 07/29/23 1911  Vision Disposition At bedside/locker/closet 07/29/23 1911          See flowsheets, patient education and plan of care for additional information.

## 2023-07-29 NOTE — Procedures
 Holly Erickson is a 82 y.o. female patient.  SNOMED [place](R) 1. Closed fracture of hip, unspecified laterality, initial encounter (HC Code) (HC CODE) (HC Code)  CLOSED FRACTURE OF HIP Past Medical History: Diagnosis Date  Arrhythmia   Colon polyp   Constipation   Depression   Hyperlipidemia  Blood pressure (!) 124/51, pulse 80, temperature 36.5 ?C, temperature source Oral, resp. rate 16, SpO2 97 %.Lower Extremity Block:Date/Time: 07/29/2023 1:41 PM femoralPain Diagnosis/Location: right hipThis block procedure is being performed for post-operative pain management at the request of:      Requesting Physician: Holly Erickson, MDPre-procedure Checklistpatient identified; site marked; pre-op evaluation performed; Universal Protocol/timeout performed; IV checked; monitors and equipment checked; informed consent obtained and options, plan, risks & benefits discussed with patientUniversal ProtocolUniversal protocol documented by nurse: noTime out unable to be performed due to emergent nature of case: NoTiming: the time out is initiated after the patient is positioned and prior to the beginning of the procedure: YesName of patient and medical record number or DOB (if MRN is unavailable) stated and checked with ID band or        previously confirmed MEDICAL RECORD NUMBERYesProceduralist states or confirms the procedure to be performed: YesThe procedural consent is used to verify the procedure to be performed: YesSite of procedure(s) (with laterally or level) is topically marked per policy and visible after draping: YesRadiographic imaging is present or a laterality side/site band is present on patient and is accessible: N/AMedications addressed: YesBlood Addressed: YesImaging addressed: N/AImplants addressed: N/ASpecial equipment or other needs addressed: N/AProceduralist discusses particular challenges or special considerations: N/ABaseline Neurological Deficits:NonePre-op Anti-coagulation ZOX:[id number] pre-procedure mental status:  AwakePrep: chloraprepNeedle A:  femoralLaterality: rightInjection technique: single-shotNeedle: Short-bevel      Gauge: 22 G      Length: 8 cmTechnique: Ultrasound guided     - The needle tip was noted to be adjacent to the nerve/plexus identified Appropriate spread of the medication was noted in real time There was no ultrasound evidence of intravascular and/or intraneural injection Imaging guidance was used to decrease the risk of complications. The needle tip was noted to be adjacent to the nerve/plexus identified. There was no ultrasound evidence of intravascular and/or intraneural injection. Appropriate spread of the medication was noted in real time. A permanent image has been stored in the patient's medical record. Event(s): blood not aspirated, injection not painful, no injection resistance, no paresthesia and no other eventPerformed By:     - Anesthesiologist: Holly Shook, MD, with Attending Anesthesiologist and with fellow     - Fellow: Holly Erickson, MDPatient's position for procedure:supineOutcome:  successful blockAttempts:  block completePatient tolerated block procedure well?:  yesAdditional Notes:Risks (including but not limited to bleeding, infection, damage to nearby structures, nerve injury, persistent weakness, inadequate analgesia and Local anesthetic toxicity),  benefits and alternatives of block discussed with patient.  All questions answered to patient's satisfaction. Patient wishes to proceed.Medication:15 ml of 0.5% Ropivacaine with 5 mg of decadron injected around the femoral nerve.VSS throughout the [employer], MD9/22/2024

## 2023-07-30 ENCOUNTER — Encounter: Admit: 2023-07-30 | Payer: PRIVATE HEALTH INSURANCE | Attending: Anesthesiology | Primary: Internal Medicine

## 2023-07-30 ENCOUNTER — Inpatient Hospital Stay: Admit: 2023-07-30 | Payer: PRIVATE HEALTH INSURANCE | Attending: Anesthesiology

## 2023-07-30 ENCOUNTER — Inpatient Hospital Stay: Admit: 2023-07-30 | Payer: PRIVATE HEALTH INSURANCE

## 2023-07-30 DIAGNOSIS — S72031A Displaced midcervical fracture of right femur, initial encounter for closed fracture: Secondary | ICD-10-CM

## 2023-07-30 LAB — BASIC METABOLIC PANEL
BKR ANION GAP: 7 (ref 7–17)
BKR BLOOD UREA NITROGEN: 17 mg/dL (ref 8–23)
BKR BUN / CREAT RATIO: 21.3 (ref 8.0–23.0)
BKR CALCIUM: 8.4 mg/dL — ABNORMAL LOW (ref 8.8–10.2)
BKR CHLORIDE: 109 mmol/L — ABNORMAL HIGH (ref 98–107)
BKR CO2: 22 mmol/L (ref 20–30)
BKR CREATININE: 0.8 mg/dL (ref 0.40–1.30)
BKR EGFR, CREATININE (CKD-EPI 2021): >60 mL/min/{1.73_m2} (ref >=60)
BKR GLUCOSE: 121 mg/dL — ABNORMAL HIGH (ref 70–100)
BKR POTASSIUM: 4.3 mmol/L (ref 3.3–5.3)
BKR SODIUM: 138 mmol/L (ref 136–144)

## 2023-07-30 LAB — HEPATIC FUNCTION PANEL
BKR A/G RATIO: 1.6 (ref 1.0–2.2)
BKR ALANINE AMINOTRANSFERASE (ALT): 31 U/L (ref 10–35)
BKR ALBUMIN: 3.4 g/dL — ABNORMAL LOW (ref 3.6–5.1)
BKR ALKALINE PHOSPHATASE: 63 U/L (ref 9–122)
BKR ASPARTATE AMINOTRANSFERASE (AST): 47 U/L — ABNORMAL HIGH (ref 10–35)
BKR AST/ALT RATIO: 1.5
BKR BILIRUBIN DIRECT: <0.2 mg/dL (ref <=0.3)
BKR BILIRUBIN TOTAL: 0.3 mg/dL (ref <=1.2)
BKR GLOBULIN: 2.1 g/dL (ref 2.0–3.9)
BKR PROTEIN TOTAL: 5.5 g/dL — ABNORMAL LOW (ref 5.9–8.3)

## 2023-07-30 LAB — CBC WITHOUT DIFFERENTIAL
BKR WAM ANC (ABSOLUTE NEUTROPHIL COUNT): 7.56 x 1000/ÂµL (ref 2.00–7.60)
BKR WAM HEMATOCRIT (2 DEC): 30 % — ABNORMAL LOW (ref 35.00–45.00)
BKR WAM HEMOGLOBIN: 9.7 g/dL — ABNORMAL LOW (ref 11.7–15.5)
BKR WAM MCH (PG): 31.1 pg (ref 27.0–33.0)
BKR WAM MCHC: 32.3 g/dL (ref 31.0–36.0)
BKR WAM MCV: 96.2 fL (ref 80.0–100.0)
BKR WAM MPV: 11.4 fL (ref 8.0–12.0)
BKR WAM PLATELETS: 101 x1000/ÂµL — ABNORMAL LOW (ref 150–420)
BKR WAM RDW-CV: 14.9 % (ref 11.0–15.0)
BKR WAM RED BLOOD CELL COUNT.: 3.12 M/ÂµL — ABNORMAL LOW (ref 4.00–6.00)
BKR WAM WHITE BLOOD CELL COUNT: 9.1 x1000/ÂµL (ref 4.0–11.0)

## 2023-07-30 LAB — MAGNESIUM: BKR MAGNESIUM: 1.9 mg/dL (ref 1.7–2.4)

## 2023-07-30 LAB — IMMATURE PLATELET FRACTION (BH GH LMW YH)
BKR WAM IPF, ABSOLUTE: 11.6 x1000/ÂµL (ref <20.0)
BKR WAM IPF: 11.6 % — ABNORMAL HIGH (ref 1.2–8.6)

## 2023-07-30 LAB — VITAMIN D 25 (VITAMIN D STATUS)(MINMETABLAB)(YH): VITAMIN D 25-HYDROXY: 47 ng/mL (ref 20–50)

## 2023-07-30 MED ORDER — MORPHINE 4 MG/ML INTRAVENOUS SOLUTION
4 mg/mL | INTRAVENOUS | Status: DC | PRN
Start: 2023-07-30 — End: 2023-08-01
  Administered 2023-07-31: 14:00:00 4 mL via INTRAVENOUS

## 2023-07-30 MED ORDER — HYDROMORPHONE (PF) 0.2 MG/ML INJECTION SYRINGE
0.2 | INTRAVENOUS | Status: DC | PRN
Start: 2023-07-30 — End: 2023-07-31

## 2023-07-30 MED ORDER — IRON DEXTRAN IVPB
Freq: Once | INTRAVENOUS | Status: CP
Start: 2023-07-30 — End: ?
  Administered 2023-07-31: 10:00:00 500.000 mL/h via INTRAVENOUS

## 2023-07-30 MED ORDER — LACTATED RINGERS INTRAVENOUS SOLUTION
INTRAVENOUS | Status: DC | PRN
Start: 2023-07-30 — End: 2023-07-31
  Administered 2023-07-30: 21:00:00 via INTRAVENOUS

## 2023-07-30 MED ORDER — EPHEDRINE SULFATE 5 MG/ML INTRAVENOUS SOLUTION
5 | INTRAVENOUS | Status: DC | PRN
Start: 2023-07-30 — End: 2023-07-31
  Administered 2023-07-30: 23:00:00 5 mg/mL via INTRAVENOUS

## 2023-07-30 MED ORDER — OXYCODONE (ROXICODONE) IMMEDIATE RELEASE 2.5 MG HALFTAB
2.5 mg | ORAL | Status: DC | PRN
Start: 2023-07-30 — End: 2023-07-31

## 2023-07-30 MED ORDER — LIDOCAINE (PF) 20 MG/ML (2 %) INTRAVENOUS SOLUTION
20 | Status: CP
Start: 2023-07-30 — End: ?

## 2023-07-30 MED ORDER — LIDOCAINE 1%/EPI 1:100,000 + MARCAINE 0.5% (1:1) (MIXTURE)
Status: DC | PRN
Start: 2023-07-30 — End: 2023-07-30
  Administered 2023-07-30: 23:00:00 2.000 mL

## 2023-07-30 MED ORDER — ONDANSETRON HCL (PF) 4 MG/2 ML INJECTION SOLUTION
4 | INTRAVENOUS | Status: DC | PRN
Start: 2023-07-30 — End: 2023-07-31

## 2023-07-30 MED ORDER — LORAZEPAM 0.5 MG TABLET
0.5 | Freq: Once | ORAL | Status: CP
Start: 2023-07-30 — End: ?
  Administered 2023-07-30: 10:00:00 0.5 mg via ORAL

## 2023-07-30 MED ORDER — GENTAMICIN 40 MG/ML INJECTION SOLUTION
40 | INTRAVENOUS | Status: DC | PRN
Start: 2023-07-30 — End: 2023-07-31
  Administered 2023-07-30: 22:00:00 40 mg/mL via INTRAVENOUS

## 2023-07-30 MED ORDER — POLYETHYLENE GLYCOL 3350 17 GRAM ORAL POWDER PACKET
17 gram | Freq: Two times a day (BID) | ORAL | Status: DC
Start: 2023-07-30 — End: 2023-08-01
  Administered 2023-07-31 – 2023-08-01 (×3): 17 gram via ORAL

## 2023-07-30 MED ORDER — LACTATED RINGERS INTRAVENOUS SOLUTION
INTRAVENOUS | Status: DC
Start: 2023-07-30 — End: 2023-07-31
  Administered 2023-07-30: 13:00:00 1000.000 mL/h via INTRAVENOUS

## 2023-07-30 MED ORDER — CEFAZOLIN IV PUSH 1 GRAM VIAL & 0.9% SODIUM CHLORIDE (ADULT)
Freq: Three times a day (TID) | INTRAVENOUS | Status: CP
Start: 2023-07-30 — End: ?
  Administered 2023-07-31 (×2): 10.000 mL via INTRAVENOUS

## 2023-07-30 MED ORDER — IRON DEXTRAN IVPB
Freq: Once | INTRAVENOUS | Status: DC
Start: 2023-07-30 — End: 2023-07-30

## 2023-07-30 MED ORDER — PROPOFOL 10 MG/ML INTRAVENOUS EMULSION
10 | Status: DC | PRN
Start: 2023-07-30 — End: 2023-07-31
  Administered 2023-07-30: 21:00:00 10 mg/mL

## 2023-07-30 MED ORDER — BUPIVACAINE (PF) 0.5 % (5 MG/ML) INJECTION SOLUTION
0.55 % (5 mg/mL) | Status: DC
Start: 2023-07-30 — End: 2023-08-01

## 2023-07-30 MED ORDER — CHOLECALCIFEROL (VITAMIN D3) 25 MCG (1,000 UNIT) TABLET
25 | Freq: Every day | ORAL | Status: DC
Start: 2023-07-30 — End: 2023-08-01
  Administered 2023-07-31 – 2023-08-01 (×2): 25 mcg (1,000 unit) via ORAL

## 2023-07-30 MED ORDER — SODIUM CHLORIDE 0.9 % (FLUSH) INJECTION SYRINGE
0.9 % | INTRAVENOUS | Status: DC | PRN
Start: 2023-07-30 — End: 2023-08-01

## 2023-07-30 MED ORDER — PHENYLEPHRINE 10 MG/ML INJECTION SOLUTION
10 | Status: CP
Start: 2023-07-30 — End: ?

## 2023-07-30 MED ORDER — HYDROMORPHONE 0.5 MG/0.5 ML INJECTION SYRINGE
0.5 | INTRAVENOUS | Status: DC | PRN
Start: 2023-07-30 — End: 2023-07-31

## 2023-07-30 MED ORDER — ONDANSETRON HCL (PF) 4 MG/2 ML INJECTION SOLUTION
42 mg/2 mL | Freq: Four times a day (QID) | INTRAVENOUS | Status: DC | PRN
Start: 2023-07-30 — End: 2023-08-01

## 2023-07-30 MED ORDER — CEFAZOLIN 1 GRAM SOLUTION FOR INJECTION
1 | Status: CP
Start: 2023-07-30 — End: ?

## 2023-07-30 MED ORDER — PROPOFOL 10 MG/ML INTRAVENOUS EMULSION
10 | INTRAVENOUS | Status: DC | PRN
Start: 2023-07-30 — End: 2023-07-31
  Administered 2023-07-30: 21:00:00 10 mL/h via INTRAVENOUS

## 2023-07-30 MED ORDER — GABAPENTIN 100 MG CAPSULE
100 mg | Freq: Two times a day (BID) | ORAL | Status: DC | PRN
Start: 2023-07-30 — End: 2023-08-01
  Administered 2023-07-30: 07:00:00 100 mg via ORAL

## 2023-07-30 MED ORDER — ONDANSETRON 4 MG DISINTEGRATING TABLET
4 mg | Freq: Four times a day (QID) | Status: DC | PRN
Start: 2023-07-30 — End: 2023-08-01

## 2023-07-30 MED ORDER — VANCOMYCIN 1,000 MG INTRAVENOUS INJECTION
1000 mg | Status: DC
Start: 2023-07-30 — End: 2023-08-01

## 2023-07-30 MED ORDER — LIDOCAINE 1 %-EPINEPHRINE 1:100,000 INJECTION SOLUTION
1 %-:00,000 | Status: DC
Start: 2023-07-30 — End: 2023-08-01

## 2023-07-30 MED ORDER — SODIUM CHLORIDE 0.9 % INTRAVENOUS SOLUTION
INTRAVENOUS | Status: DC
Start: 2023-07-30 — End: 2023-07-31
  Administered 2023-07-31: 04:00:00 via INTRAVENOUS

## 2023-07-30 MED ORDER — PREGABALIN 100 MG CAPSULE
100 | Freq: Two times a day (BID) | ORAL | Status: DC | PRN
Start: 2023-07-30 — End: 2023-07-30

## 2023-07-30 MED ORDER — APIXABAN 2.5 MG TABLET
2.5 mg | Freq: Two times a day (BID) | ORAL | Status: DC
Start: 2023-07-30 — End: 2023-08-01
  Administered 2023-07-31 – 2023-08-01 (×4): 2.5 mg via ORAL

## 2023-07-30 MED ORDER — SENNOSIDES 8.6 MG TABLET
8.6 mg | Freq: Two times a day (BID) | ORAL | Status: DC
Start: 2023-07-30 — End: 2023-08-01
  Administered 2023-07-31 – 2023-08-01 (×3): 8.6 mg via ORAL

## 2023-07-30 MED ORDER — SODIUM CHLORIDE 0.9 % (FLUSH) INJECTION SYRINGE
0.9 % | Freq: Three times a day (TID) | INTRAVENOUS | Status: DC
Start: 2023-07-30 — End: 2023-08-01
  Administered 2023-07-31 – 2023-08-01 (×4): 0.9 mL via INTRAVENOUS

## 2023-07-30 MED ORDER — ACETAMINOPHEN 325 MG TABLET
325 mg | Freq: Four times a day (QID) | ORAL | Status: DC
Start: 2023-07-30 — End: 2023-08-01
  Administered 2023-07-31 – 2023-08-01 (×5): 325 mg via ORAL

## 2023-07-30 MED ORDER — TRANEXAMIC ACID INTRA-ARTICULAR INJ
Status: DC | PRN
Start: 2023-07-30 — End: 2023-07-30
  Administered 2023-07-30: 23:00:00 50.000 mL via INTRA_ARTICULAR

## 2023-07-30 MED ORDER — TRANEXAMIC ACID 1,000 MG/10 ML (100 MG/ML) INTRAVENOUS SOLUTION
1000 | Status: CP
Start: 2023-07-30 — End: ?

## 2023-07-30 MED ORDER — TRANEXAMIC ACID 1,000 MG/10 ML (100 MG/ML) INTRAVENOUS SOLUTION
1000 mg/10 mL (100 mg/mL) | Status: DC
Start: 2023-07-30 — End: 2023-08-01

## 2023-07-30 MED ORDER — BETADINE IRRIGATION 500 ML
Status: DC | PRN
Start: 2023-07-30 — End: 2023-07-30
  Administered 2023-07-30: 23:00:00

## 2023-07-30 MED ORDER — VANCOMYCIN 1,000 MG INTRAVENOUS INJECTION
1000 mg | Status: DC | PRN
Start: 2023-07-30 — End: 2023-07-30
  Administered 2023-07-30: 23:00:00 1000 mg via TOPICAL

## 2023-07-30 MED ORDER — PHENYLEPHRINE 1 MG/10 ML (100 MCG/ML) IN 0.9 % SOD.CHLORIDE IV SYRINGE
1 | INTRAVENOUS | Status: DC | PRN
Start: 2023-07-30 — End: 2023-07-31
  Administered 2023-07-30: 22:00:00 1 mg/0 mL (00 mcg/mL) via INTRAVENOUS

## 2023-07-30 MED ORDER — PHENYLEPHRINE FOR ANESTHESIA
INTRAVENOUS | Status: DC | PRN
Start: 2023-07-30 — End: 2023-07-31
  Administered 2023-07-30: 22:00:00 100.000 mL/h via INTRAVENOUS

## 2023-07-30 MED ORDER — FENTANYL (PF) 50 MCG/ML INJECTION SOLUTION
50 | INTRAVENOUS | Status: DC | PRN
Start: 2023-07-30 — End: 2023-07-31

## 2023-07-30 MED ORDER — SODIUM CHLORIDE 0.9 % IRRIGATION SOLUTION
0.9 | Status: CP | PRN
Start: 2023-07-30 — End: ?
  Administered 2023-07-30 (×2): 0.9 % irrigation

## 2023-07-30 MED ORDER — OXYCODONE IMMEDIATE RELEASE 5 MG TABLET
5 mg | ORAL | Status: DC | PRN
Start: 2023-07-30 — End: 2023-07-31
  Administered 2023-07-31 (×2): 5 mg via ORAL

## 2023-07-30 MED ORDER — NALOXONE 0.4 MG/ML INJECTION SOLUTION
0.4 | INTRAVENOUS | Status: DC | PRN
Start: 2023-07-30 — End: 2023-07-31

## 2023-07-30 MED ORDER — CEFAZOLIN 1 GRAM SOLUTION FOR INJECTION
1 | INTRAVENOUS | Status: DC | PRN
Start: 2023-07-30 — End: 2023-07-31
  Administered 2023-07-30: 22:00:00 1 gram via INTRAVENOUS

## 2023-07-30 MED ORDER — LIDOCAINE (PF) 20 MG/ML (2 %) INTRAVENOUS SOLUTION
20 | INTRAVENOUS | Status: DC | PRN
Start: 2023-07-30 — End: 2023-07-31
  Administered 2023-07-30: 21:00:00 20 mg/mL (2 %) via INTRAVENOUS

## 2023-07-30 MED ORDER — TRANEXAMIC ACID 1,000 MG/10 ML (100 MG/ML) INTRAVENOUS SOLUTION
1000 | INTRAVENOUS | Status: DC | PRN
Start: 2023-07-30 — End: 2023-07-31
  Administered 2023-07-30: 22:00:00 1000 mg/10 mL (100 mg/mL) via INTRAVENOUS

## 2023-07-30 MED ORDER — ONDANSETRON 4 MG DISINTEGRATING TABLET
4 mg | Freq: Four times a day (QID) | ORAL | Status: DC | PRN
Start: 2023-07-30 — End: 2023-08-01

## 2023-07-30 MED ORDER — PROPOFOL 10 MG/ML INTRAVENOUS EMULSION
10 | Status: CP
Start: 2023-07-30 — End: ?

## 2023-07-30 NOTE — Plan of Care
 Plan of Care Overview/ Patient Status    Problem: Adult Inpatient Plan of CareGoal: Readiness for Transition of CareOutcome: Interventions implemented as appropriate 82 y.o. female with a Right subcapital fracture status post mechanical fall. Case Management Screening and Evaluation  Flowsheet Row Most Recent Value Case Management Screening: Chart review completed. If YES to any question below then proceed to CM Eval/Plan  Is there a change in their cognitive function No Do you anticipate that the pt will have any discharge needs requiring CM intervention? Yes Has there been an unscheduled readmission within the last 30 days and/or four (4) encounters (encounters include: ED, OBS, Inpatient) within the last six (6) months? No Were there services prior to admission ( Examples: Assisted Living, HD, Homecare, Extended Care Facility, Methadone, SNF, Outpatient Infusion Center) No Negative/Positive Screen Positive Screening: Complete CM Evaluation and Plan Case manager will take lead to arrange post-acute care services and continue to work with the care team as the patient progresses towards discharge Yes Case Manager Attestation  I have reviewed the medical record and completed the above screen. CM staff will follow patient's progress and discuss the plan of care with the Treatment Team. Yes Case Management Evaluation and Plan  Arrived from prior to admission home/apartment/condo Do you have a caregiver, or do you anticipate the need for a caregiver given the change in your physicial function? No  Lives With Alone Services Prior to Admission none Patient Requires Care Coordination Intervention Due To discharge planning needs/concerns Prior to Hospitalization: Assistance Needed/DME being used Ambulation, Bathing Ambulation Assistance/DME: rollator, straight cane Bathing Assistance/DME: shower chair, bed/bath same level Documented Insurance Accurate Yes Any financial concerns related to anticipated discharge needs No Patient's home address verified Yes Patient's PCP of record verified Yes Last Date Seen by PCP 6-12 months Source of Clinical History  Patient's clinical history has been reviewed and source of Information is: Patient, Child(ren), Medical record Case Manager Attestation  I have reviewed the medical record and completed the above evaluation with the following recommendations. Yes Discharge Planning Coordination Recommendations  Discharge Planning Coordination Recommendations Needs not determined at this time Case Manager reviewed plan of care/ continuum of care need's with  Patient, Interdisciplinary Team, Transitional care rounds  Met with pt and son at bedside. Introduced myself and explained CM role. Pt lives alone in one level apt with 0 STE. Owns rollator, straight cane, wheel chair and shower chair but did not use any assistive device PTA. Pt independent with ADLs PTA. Son lives a mile away and checks in one on her on a daily basis. Pt and son prefers [facility] choice, [facility] in [place] as 2nd, CM sent repisodic list to son per pt request for additional referrals to be sent should STR be indicated.I discussed with the patient/family that the Care Management Department will send out a minimum of 5 referrals to facilities, when appropriate, that can meet their needs.  Referrals will include a patient/family preference along with facilities within a geographical area from the patient's home. If the preferred facility does not have an appropriate bed that can meet their needs, referrals will continue to be sent to identify other facilities that have available beds to avoid delays in discharge. Once the provider determines the patient is medically ready for discharge, the patient will transition to the accepting facility for initiation of their skilled needs. The patient/family states understanding.CM to follow for discharge planning.Roderic Scarce, MSN, RNCase ManagerYNHH

## 2023-07-30 NOTE — OR Nursing
 Bladder scan at conclusion of procedure , no straight cath per protocol

## 2023-07-30 NOTE — Other
 [facility] SYSTEMOPERATIVE PROCEDURE NOTEPatient Name:	Holly Erickson Number:	[id number] Date of Birth:	[birth year]Date of Procedure: 09/23/24Pre-operative Diagnosis:  1) Transcervical displaced, comminuted fracture of the right femoral neck2) Osteoarthritis of Right Hip Post-operative Diagnosis:  Same as aboveProcedure Performed:  Right Total Hip Arthroplasty 27130Surgeon: Arlana Lindau, MDSurgical Assistant(s): Fellow: Joaquin Music, MD, PGY-6Resident: Katha Hamming, MD Circulator: Dereck Ligas, RNRelief Circulator: Shellia Carwin, RNScrub Person: Carlynn Purl, LuzFirst Assist Documentation:I understand that 1842(b)(7)(D) of the Act generally prohibits Medicare physician fee schedule payment for the services of assistants at surgery in teaching hospitals when qualified residents are available to furnish such services. I certify that the services for which payment is claimed were medically necessary and that NO QUALIFIED RESIDENT WAS AVAILABLE TO PERFORM THE SERVICES. I further understand that these services are subject to post-payment review by a Medicare carrier. During this procedure, the first assistant helped with patient positioning, surgical site preparation, sterile field draping, surgical exposure and retraction, achievement of meticulous hemostasis, instrumentation, along with both deep and superficial joint closure.Anesthesia:  Spinal anesthesia with PNB; 30 mL of 1% Lidocaine mixed with 0.5 % Marcaine as Peri-articular InjectionAnesthesiologist: Bustos, Arsenio M, MDspinal  IV Fluids: 600 mLEstimated Blood Loss:  50ml  Transfusions: NonePre-Operative IV Medications: 2 Grams IV Ancef, 100 mg IV Gentamicin, 1 Gram IV TXA Urine Output: Bladder Scan Pending Per Nursing ProtocolsDrains: None       Pathology Specimens:  Bone specimen to pathology per protocol ID Type Source Tests Collected by Time 1 : Right hip bone Tissue Bone PATHOLOGY [facility]) Arlana Lindau, MD 07/30/2023 1609       Microbiology Specimens: None Implants: Depuy Emphasys 52mm 3-Hole Shell, 30 and 15mm Screws, sterile bone wax, with a 40 mm inner diameter neutral XLPE AOX liner.  On the femoral side we used a size 11 standard offset collared Corail stem with a +5 x 40 mm ceramic head.Implant Name Type Inv. Item Serial No. Manufacturer Lot No. LRB No. Used Action SHELL ACET HIP POR CTD 3-HOLE TI EMPHASYS - [id number] Implant SHELL ACET HIP POR CTD 3-HOLE TI EMPHASYS  [employer]ol Luz [employer] [id number] Right 1 Implanted 52mm x 40mm Emphasys Polyethylene Liner, AOX, Neutral Implant   [employer] [id number] Right 1 Implanted SCREW ACTB 6.5X15MM CANC PNCL - [id number] Implant SCREW ACTB 6.5X15MM CANC PNCL  Ferol Luz [employer] [id number] Right 1 Implanted SCREW ACTB 6.5X30MM CANC PNCL - [id number] Implant SCREW ACTB 6.5X30MM CANC PNCL  Ferol Luz [employer] [id number] Right 1 Implanted STEM FEM S11 STD CLLR CORAIL - [id number] Implant STEM FEM S11 STD CLLR Canary Brim [employer] [id number] Right 1 Implanted ARTICUL/EZE FEMORAL HEAD CERAMIC 12/14 TAPER 40mm plus 5 Implant   DEPUY SYN[employer]ES, [employer] [id number] Right 1 Implanted Narrative:Indications: The patient presented with a femoral neck fracture.  This was discussed on the service upon admission and it appeared to me to be displaced in the patient who is of advanced age with limited capacity to heal her bone, so I recommended arthroplasty.  She previously was treated about 6 or 8 years ago with a left total hip arthroplasty which appeared to be in satisfactory position and she had done very well with this procedure.  As result, I recommended consideration of total versus hemiarthroplasty based on the intraoperative findings.  This was discussed with the patient her son of the preoperative holding area where I examined her.  She did  have some shortening external rotation of the right lower extremity but she was neurologically intact to motor function and sensory function.I recommended that we consider a total hip arthroplasty because I suspected she had advanced arthritis in the hip joint and she was functional at baseline walking without the use of a walker.  She had a leg length discrepancy of a proximally 5 mm while seated at baseline in the preoperative holding area. Prior conservative care was documented in the outpatient record. The risks, benefits and alternatives of surgical management were explained to the patient, and written informed consent was co signed.  All questions were answered. The patient was identified and marked in the preoperative holding area and was then brought to the operating room for the procedure.Procedure Details:The patient was again identified in the OR, initiated under anesthesia, then placed supine with a gel bump under the ipsilateral hemisacrum and hemithorax. The contralateral arm and leg were secured, abducted onto a gel pad and arm board with a SCD device on the contralateral the leg for DVT prophylaxis. The ipsilateral arm was secured across the chest over a pillow with tape. The operative leg was given a chlorhexidine prescrub and ChloraPrep surgical solution. Sterile gowns and gloves were donned. Sterile drapes were placed including a stockinette and Coban to compress the ipsilateral calf, Ioban to cover the exposed portions of the skin, and top gloves were changed once the drapes were applied. We verified the patient's name, preoperative diagnosis, procedure being performed and all information was correct, including IV antibiotic administration within 30 minutes of incision. We thus proceeded. The direct anterior hip landmarks were palpated and an incision was marked just distal and lateral to the ASIS. We incised with a blade and then dissected through skin and subcutaneous tissues, maintaining meticulous hemostasis with the Bovie electrocautery. We identified and split the Scarpa's layer and then identified the tensor fascia, carefully splitting this lateral to the internervous plane. We mobilized the tensor muscle laterally, then identified and cauterized the lateral circumflex vessels. We placed retractors around the entire capsule medially and laterally and elevated the reflected head of the rectus femoris anteriorly. Once this was done, we excised the anterior hip capsule sharply. We created a subcapital then a basicervical osteotomy in situ. I removed the femoral head and neck from the field, measuring a 47.5 mm native head. We released the capsule from the calcar. We gained exposure to the acetabulum, debrided peri-acetabular osteophytes and debrided the cotyloid fossa to identify preserve the transverse acetabular ligament. Inspection of the joint demonstrated that she had very significant osteoarthritis centrally in the dome as well as a completely destroyed labrum, had a very large posterior rim osteophyte hanging down from the joint space, so we made the decision to pursue total hip arthroplasty rather than hemiarthroplasty at this time.With exposure achieved, we began acetabular instrumentation with a 40 mm straight basket reamer,  Then continued to use this straight reamer to within 1mm of the final cup size, briefly reamed line to line, then we placed a trial shell.  After we verified the position and intrinsic stability of the trial, we then removed it and ultimately placed the final 52 mm 3-Hole Emphasys Shell in an appropriate degree of abduction and anteversion based on the native hip landmarks.  Due the fact that she had osteopenia at the dome of the cup we added 2 screws in the superior posterior quadrant measuring 30 and 15 mm.  Each of these had satisfactory position and bite.  We filled in some sterile bone wax into the central and lateral holes for tamponade.   We removed excess wax, washed and dried the inner shell meticulously, and then impacted a Neutral XLPE AOX liner. We verified that this was seated properly with a snap, and finally trimmed a large posterior rim peri-acetabular osteophytes to complete our acetabular preparation. We returned attention to the femur, mobilizing the proximal bone with a limited posterolateral capsular release at the 12:30-1 o'clock position to the corner of the trochanter. I extended, externally rotated, and adducted the limb and elevated with a large bone hook.  Once the calcar was appropriately exposed, we provisionally marked the neutral point prior to broaching. I entered the canal with a rat-tail rasp, followed by a series of broaches that is with the Corail stem system match the left hip, and then trialed the head and neck. Once satisfied with the position and stability of the construct, I removed the trial component, copiously irrigated the canal, verified our position within the canal, and placed our final collared stem. I re-trialed the femoral head and verified hip stability. It was stable against extension, external rotation, and adduction, and had acceptable shuck. The leg length was checked and verified against the contralateral medial malleolus with the legs neutrally extended. The final 36+5 mm DePuy Articuleze head was selected and impacted onto the dry trunnion once the trial head was removed, then a final reduction was performed and again checked through a full ROM for stability in all planes.Once the implants were completed, I began closure by leveling the table and irrigating copiously with saline lavage, followed by lavage with dilute betadine irrigation, which was allowed to soak for 3 minutes while we changed the bottom drape and our top gloves. I evacuated this fluid, and once again maintained meticulous peri-capsular hemostasis with electrocautery, and then placed 1 gram of crushed Vanco powder into the deep joint space for postoperative infection prophylaxis. I injected 30 cc of local anesthetic mixture around the pericapsular tissues for postoperative analgesia and closed the tensor fascia with interrupted #1 Vicryl, then running #1 Stratafix CTX Barbed Suture for watertight closure from proximal to distal. I then injected 1000mg  of topical tranexamic acid into the joint space for postoperative hemostasis. We irrigated again superficially with dilute betadine lavage, then closed with interruped #0 Vicryl, #2-0 Vicryl, #3-0 subcuticular Stratafix, then covered the skin with the Dermabond glue.  Once this was dry we applied a Mepilex surgical cover dressing.  Sponge and needle counts were correct x2 at the conclusion of the case.  No complications or transfusions to report. The patient was brought to the recovery room in stable condition following the procedure.AJRR Procedure Documentation:Hip Approach: Direct anterior  Complications: None Postoperative Disposition: To PACU, Stable Condition.Postoperative DVT Prophylaxis: Multi-Modal Protocol, with Eliquis 2.5mg  PO BID x 5 weeks  Postoperative Rehab Protocol: WBAT, ROM as tolerated. RW for Fall Prevention. Postoperative Antibiotic Protocol: - Complete 2 more dose(s) of IV Ancef per usual protocols.Postoperative Surgical Dressing: Dermabond and Mepilex, Remove Cover dressing in 7 daysPhysician Face to Face Attestation: I certify that I had a face to face encounter with this patient that meets the physician face-to-face encounter requirements for this patient on the date of their joint replacement surgery today, on 07/30/23, and the reason for this encounter is related to the primary reason the patient needs home health care in the immediate period following their joint replacement surgical procedure. By signing this operative note, I verify that the documentation supporting this  patient's need for home health services and home bound status is contained in the medical record. I certify, based on my findings, that the following services are medically necessary for home health care: [x]   Nursing Care  [x]  Physical Therapy [x]  Occupational Therapy. Furthermore, I certify that my clinical findings support that this patient is homebound following their joint arthroplasty surgery, as both of the following criteria are met: [x]  (A) the patient must either , because of injury or illness, need the aid of supportive devices such as crutches, cane, wheelchairs, and walkers; the use of special transportation, or the assistance of another person in order to leave their place of residence, OR have a condition such that leaving their place of residence is medically contra-indicated. [x]  (B) a normal inability to leave home must exist AND leaving home must require a considerable and taxing effort.Teaching Physician Attestation:  I was present for patient identification, positioning, draping, surgical exposure, instrumentation, and final prosthesis implantation.  I participated in the fascial closure and then the assistants completed the superficial layers and skin closure with dressing application.  Thus, I was present for all key components of the surgical procedure.

## 2023-07-30 NOTE — Progress Notes
 Hospitalist Medicine Progress NoteAttending Provider: Cassandria Anger, MD [phone]: Chief Complaint: Follow up R fem neck fxInterim History: Holly Erickson is admitted with R hip fx. She reports pain in R hip with movement. She denies SOB, CP, N/V, fever or chills. She is awaiting surgery. All other ROS reviewed and negative.Reviewed PMH, PSH, allergies.Objective: Current medications: Current Facility-Administered Medications Medication Dose Route Frequency Provider Last Rate Last Admin  [Held by provider] aspirin EC delayed release tablet 81 mg  81 mg Oral Daily Diaz-Patterson, Quianna, PA      buPROPion XL (WELLBUTRIN XL) 24 hr tablet 150 mg  150 mg Oral Daily Diaz-Patterson, Quianna, PA      DULoxetine (CYMBALTA) DR capsule 60 mg  60 mg Oral Daily Diaz-Patterson, Quianna, PA      gabapentin (NEURONTIN) capsule 300 mg  300 mg Oral Nightly Diaz-Patterson, Quianna, PA   300 mg at 07/29/23 2126  [START ON 07/31/2023] iron dextran (INFED) 1,000 mg in sodium chloride 0.9% 500 mL IVPB  1,000 mg Intravenous Once Rosetta Posner, APRN      melatonin tablet 6 mg  6 mg Oral Nightly Diaz-Patterson, Quianna, PA   6 mg at 07/29/23 2126  polyethylene glycol (MIRALAX) packet 17 g  17 g Oral Daily Diaz-Patterson, Quianna, PA      rosuvastatin (CRESTOR) tablet 20 mg  20 mg Oral Daily Diaz-Patterson, Quianna, PA      senna (SENOKOT) tablet 17.2 mg  2 tablet Oral Nightly Diaz-Patterson, Quianna, PA      sodium chloride 0.9 % flush 3 mL  3 mL IV Push Q8H Diaz-Patterson, Quianna, PA   3 mL at 07/30/23 0554  tranexamic acid (CYKLOKAPRON) 1 g in sodium chloride 0.9% 100 mL IVPB (ORTHOPEDIC PROCEDURES - mini-bag plus)  1 g Intravenous Once Diaz-Patterson, Quianna, PA      tranexamic acid (CYKLOKAPRON) 1,000 mg/10 mL (100 mg/mL) injection           zinc sulfate (ZINCATE) capsule 220 mg  220 mg Oral Daily Diaz-Patterson, Quianna, PA     Home medications:Vit c dailyGabapentin 300mg  QHSASA 81mg  dailyWellbutrin 100mg  dailyCymbalta 60mg  dailyMVI dailyMelatonin 5mg  qpmCrestor 40mg  dailyZinc dailyHeld: Vit c dailyASA 81mg  dailyMVI dailyZinc dailyDVT prophylaxis: held for ORDiet: NPOFoley: NoneLast bowel movement: 9/21I/O's:Gross Totals (Last 24 hours) at 07/30/2023 0840Last data filed at 07/29/2023 2058Intake 1000 ml Output 250 ml Net 750 ml Physical Exam: BP 107/63  - Pulse (!) 59  - Temp 97.5 ?F (36.4 ?C) (Oral)  - Resp 18  - Ht 5' (1.524 m)  - Wt 50.2 kg  - SpO2 94%  - BMI 21.61 kg/m? Oxygen: 94% on room airFSBG 121Access: PIVAppearance: female, sitting up in bed in NAD ENT: O/P clear, mucous membranes moist, neck suppleCardiovascular: RRR S1 S2 no murmurRespiratory: Lungs clearGI: Abdomen soft, NT ND, positive BSMusculoskelatal: tender R hip Skin: No rashesVascular: No edemaNeurologic: Awake and alert x3, CAM negative Psychiatric: Normal affectLabs:Recent Results (from the past 24 hour(s)) Troponin T High Sensitivity, 3 Hour (BH GH LMW YH)  Collection Time: 07/29/23  1:30 PM Result Value Ref Range  High Sensitivity Troponin T 12 (H) See Comment ng/L  1 hour Delta from 0 Hour, HS-Troponin T 0 ng/L  3 hour Delta from 0 Hour, HS-Troponin T 0 ng/L Echo 2D Complete w Doppler and CFI if Ind Image Enhancement 3D and or bubbles  Collection Time: 07/29/23  3:34 PM Result Value Ref Range  Reported Biplane EF% 64 % CBC without differential  Collection Time: 07/30/23  7:32 AM Result Value  Ref Range  WBC 9.1 4.0 - 11.0 x1000/?L  RBC 3.12 (L) 4.00 - 6.00 M/?L  Hemoglobin 9.7 (L) 11.7 - 15.5 g/dL  Hematocrit 44.03 (L) 47.42 - 45.00 %  MCV 96.2 80.0 - 100.0 fL  MCH 31.1 27.0 - 33.0 pg  MCHC 32.3 31.0 - 36.0 g/dL  RDW-CV 59.5 63.8 - 75.6 %  Platelets 101 (L) 150 - 420 x1000/?L  MPV 11.4 8.0 - 12.0 fL  ANC (Abs Neutrophil Count) 7.56 2.00 - 7.60 x 1000/?L Basic metabolic panel  Collection Time: 07/30/23  7:32 AM Result Value Ref Range  Sodium 138 136 - 144 mmol/L  Potassium 4.3 3.3 - 5.3 mmol/L  Chloride 109 (H) 98 - 107 mmol/L  CO2 22 20 - 30 mmol/L  Anion Gap 7 7 - 17  Glucose 121 (H) 70 - 100 mg/dL  BUN 17 8 - 23 mg/dL  Creatinine 4.33 2.95 - 1.30 mg/dL  Calcium 8.4 (L) 8.8 - 10.2 mg/dL  BUN/Creatinine Ratio 18.8 8.0 - 23.0  eGFR (Creatinine) >60 >=60 mL/min/1.35m2 Immature Platelet Fraction (BH GH LMW YH)  Collection Time: 07/30/23  7:32 AM Result Value Ref Range  Immature Platelet Fraction 11.6 (H) 1.2 - 8.6 %  Absolute Immature Platelet Fraction 11.6 <20.0 x1000/?L Magnesium  Collection Time: 07/30/23  7:32 AM Result Value Ref Range  Magnesium 1.9 1.7 - 2.4 mg/dL Hepatic function panel  Collection Time: 07/30/23  7:32 AM Result Value Ref Range  Total Bilirubin 0.3 <=1.2 mg/dL  Bilirubin, Direct <4.1 <=0.3 mg/dL  Alkaline Phosphatase 63 9 - 122 U/L  Alanine Aminotransferase (ALT) 31 10 - 35 U/L  Aspartate Aminotransferase (AST) 47 (H) 10 - 35 U/L  AST/ALT Ratio 1.5 Reference Range Not Established  Total Protein 5.5 (L) 5.9 - 8.3 g/dL  Albumin 3.4 (L) 3.6 - 5.1 g/dL  Globulin 2.1 2.0 - 3.9 g/dL  A/G Ratio 1.6 1.0 - 2.2 GFR: >60Urine macro: NoneMicro: MRSA PCR negative Diagnostics: CTA Chest Abdomen aortic dissection (entire aorta visualized)Result Date: 9/22/2024No aortic aneurysm. [facility] Radiology Notify System Classification: Routine. Reported and signed by: Lupita Leash, MD  [facility] Radiology and Biomedical Imaging XR Chest PA or APResult Date: 07/29/2023 Normal preoperative chest [facility] Radiology Notify System Classification: Routine. Reported and signed by: Rexford Maus, MD  [facility] Radiology and Biomedical Imaging [place] Pelvis wo IV ContrastResult Date: 9/22/2024Subcapital right femoral fracture. [place] Radiology Notify System Classification: Routine. Report initiated by:  Clair Gulling, MD Reported and signed by: Rosey Bath, MD  [facility] Radiology and Biomedical Imaging Lower Leg 2V LeftResult Date: 9/22/2024Subcapital right femoral fracture. [place] Radiology Notify System Classification: Routine. (accession [id number]), Routine. (accession [id number]) Report initiated by:  Dorina Hoyer, MD Reported and signed by: Rosey Bath, MD  [facility] Radiology and Biomedical Imaging XR Hip RightResult Date: 9/22/2024Subcapital right femoral fracture. [place] Radiology Notify System Classification: Routine. (accession [id number]), Routine. (accession [id number]) Report initiated by:  Dorina Hoyer, MD Reported and signed by: Rosey Bath, MD  [facility] Radiology and Biomedical Imaging [place] Head wo IV ContrastResult Date: 9/22/2024No  evidence for acute intracranial pathology.  Radiology Notify System Classification: Routine. Reported and signed by: Rosey Bath, MD  [facility] Radiology and Biomedical Imaging  ECG/Tele Events: NSR HR 75 q waves in antero septal leads, no new ischemic changesEcho: Normal left ventricular size, wall thickness, systolic function and wall motion. The left ventricular ejection fraction calculated by biplane Simpson's was 64%. Normal right ventricular cavity size and systolic function.  Estimated right ventricular systolic pressure is 65 mmHg compatible  with pulmonary hypertension. Visually the left atrium appears normal.  Right atrium is normal in size. Trileaflet aortic valve.  No aortic stenosis.  Mild to moderate aortic regurgitation. Mild mitral regurgitation. Mild tricuspid regurgitation.  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 IVC diameter < 2.1 cm that collapses < 50% with a sniff suggests mildly increased RAP (5-10 mmHg, mean 8 mmHg). No significant pericardial effusion. No prior study available for comparison.Assessment: Mrs. Sciullo is admitted R fem neck fx. She has hx of constipation, MDD, HLD, arrhythima, tremors, smoking. Active Problems: R fem neck fx. Risk for delirium Acute blood loss anemia - with iron deficiencyhx of constipation, MDD, HLD, arrhythima, tremors, smoking. Plan: Proceed to OR as plannedChange IVF to LR @ 169ml/hWean O2 for O2 sat >90%CTA negative for AAAResume home ASA when surgically stableWill need Dvt ppx w lovenox post opGive iron IV x1 in AMAggressive bowel regimen Vit D level pending, start Vit D3 2000u daily indefinitelyWill continue to follow> 35 minutes spent on care of patient, > 50% time spent coordinating careMedical decision making for this patient was moderate.Contacts: PCP:  Laury Deep, OsamaSigned:Danyelle Brookover Duaine D[facility] Medicine APPSenior Nurse Practitioner IIIMHB: [phone] call if I can be of any assistance. 07/30/2023

## 2023-07-30 NOTE — Anesthesia Pre-Procedure Evaluation
 This is a 82 y.o. female scheduled for Right hip hemiarthroplasty vs total hip arthroplasty (Right).Review of Systems/ Medical HistoryPatient summary, nursing notes, EKG/Cardiac Studies , Labs, pre-procedure vitals, height, weight and NPO status reviewed.No previous anesthesia concernsAnesthesia Evaluation:   No history of anesthetic complications  Estimated body mass index is 21.61 kg/m? as calculated from the following:  Height as of this encounter: 5' (1.524 m).  Weight as of this encounter: 50.2 kg. Cardiovascular: Negative      -Exercise tolerance: >4 METS -Vascular Disease:  Negative    Respiratory:  Negative.HEENT: Negative.Neuromuscular: NegativeSkeletal/Skin:  NegativeGastrointestinal/Genitourinary:  Negative Hematological/Lymphatic: NegativeEndocrine/Metabolic:  Negative.Behavioral/Social/Psychiatric & Syndromes: Negative Patient has depression.Physical ExamCardiovascular:      Rhythm: regularPulmonary:    Patient's breath sounds clear to auscultationAirway:  Mallampati: IITM distance: >3 FBNeck ROM: fullMouth Opening: >3cmDental:  Dentition: edentulousAnesthesia PlanASA 3 The primary anesthesia plan is  spinal. Perioperative Code Status confirmed: It is my understanding that the patient is currently designated as 'Full Code' and will remain so throughout the perioperative period.Anesthesia informed consent obtained. Consent obtained from: patientUse of blood products: consented  The post operative pain plan is IV analgesics.Plan discussed with CRNA.Anesthesiologist's Pre Op NoteI personally evaluated and examined the patient prior to the intra-operative phase of care on the day of the procedure.Marland Kitchen

## 2023-07-30 NOTE — Other
 PHARMACY-ASSISTED MEDICATION REPORTPharmacist review of the best possible medication history obtained by the pharmacy medication history technician has been performed.  I have updated the home medication list and identified the following information that may be relevant to this admission.NOTES/RECOMMENDATIONS Consider increasing rosuvastatin to 40 mg daily (home regimen).       Prior to Admission Medications Medication Name Sig Taking? Patient Reported   ascorbic acid (VITAMIN C ORAL)Last dose:  --  Take by mouth daily. Yes Yes   aspirin 81 mg EC delayed release tabletLast dose:  --  Take 1 tablet (81 mg total) by mouth daily. Yes Yes   buPROPion SR (WELLBUTRIN SR) 100 mg 12 hr tabletLast dose:  --  1 tab(s) orally once a day for 30 days Yes Yes   camphor-menthoL (SARNA) 0.5-0.5 % lotionLast dose:  --  Apply topically as needed for itching. Keep in fridge. Yes     DULoxetine (CYMBALTA) 60 MG capsuleLast dose:  --  Take 1 capsule (60 mg total) by mouth daily. Yes Yes   enzymes,digestive (DIGESTIVE ENZYMES ORAL)Last dose:  --  Take by mouth daily.   Yes   gabapentin (NEURONTIN) 100 mg capsuleLast dose:  --  Take 1 capsule (100 mg total) by mouth 2 (two) times daily with breakfast and dinner. Yes Yes   gabapentin (NEURONTIN) 300 mg capsuleLast dose:  --  Take 1 capsule (300 mg total) by mouth. Yes Yes   melatonin 5 mg tabletLast dose:  --  Take 1 tablet (5 mg total) by mouth nightly. Yes Yes   multivitamin capsuleLast dose:  --  Take 1 capsule by mouth daily. Yes Yes   rosuvastatin (CRESTOR) 40 mg tabletLast dose:  --  Take 1 tablet (40 mg total) by mouth daily. Yes Yes   zinc gluconate 50 mg tabletLast dose:  --  Take 1 tablet (50 mg total) by mouth daily. Yes Yes   Prior to admission medications last reviewed by Clarisse Gouge on Sun Jul 29, 2023 1324 Thank you,Jams Trickett, PharmD9/23/20246:32 AMPhone: MHB

## 2023-07-30 NOTE — Plan of Care
 Plan of Care Overview/ Patient Status    Patient is A&Ox4.On 2L NCO2 saturation is 95%.Bedrest at this time.Purewick in place.RLE is warm, no discoloration, +DP, +Dorsiflexion/ plantar flexion.LBM 9/21Severe pain controlled with oxy 5mg  prn and prn gabapentin 100mg .Patient not able to sleep well despite melatonin scheduled.NPO sips of water with medications started at 12:00AM.2nd dose of tranexamic acid completed.NS@100  is running.Will continue to assess.

## 2023-07-30 NOTE — Other
 Home Hospital Screening ToolPatient Data:  Patient Name: Holly Erickson Age: 82 y.o. DOB: Apr 01, 1941	 MRN: [id number]	 Brief History R femoral neck fx s/p fall Ortho on boardNext steps: Patient clinically appropriate for [facility] [place] Control nurse will perform social criteria evaluationPlease call [facility] [phone] with any questionsPatient's calculated screening result: Clinical Screening Tool Result: MEETS CRITERIA for [facility] as of today but may meet clinical criteria on subsequent days.Home Hospital provides acute care to eligible patients who meet inpatient level of care in their own home. Eligible patients are non-pregnant adults with traditional Medicare, eligible Managed Medicare or [facility] employee health plan who meet specific clinical and social stability criteria.Patients will receive multiple in-person nursing visits per day, 24/7 virtual nursing, scheduled and PRN in-person PA or APRN visits and daily video MD visits.   Diagnostics and interventions include IV infusion/antibiotics, rehab therapy, mobile imaging and video specialty consult follow up.  For more information: [email or web address] Clinical Screening Tool - 07/30/23 1323    Clinical Screening Tool  Is this patient from an eligible patient list (not a consult list):   Yes   Are step-down/ICU level interventions currently required or anticipated to be required? No   Is the patient likely to need any of the below within the next 12-24hrs? No   Clinical Screening Tool Result MEETS CRITERIA      Electronically Signed:Yissel Habermehl, PA9/23/2024, 1:28 PMIf needed the [facility] Control MD can be reached at [phone] OR to text, search for the [facility] Dynamic role: [facility] Control All.

## 2023-07-30 NOTE — Treatment Summary
 Vascular surgery was consulted for concern of ascending aortic aneurysm.  CTA was ordered and showed no aneurysm in aorta.No barriers for surgery from vascular surgery standpoint.Please call with questions concerns.Bonney Aid, MD*Addendum: if there is still concern for aneurism, please consider [place] of bilateral popliteal arteries. However, it should not be cause for delaying the surgery.

## 2023-07-30 NOTE — Care Coordination-Inpatient
 The following were selected through [facility] - [place], [facility], [facility] referrals sent, TC will continue to followTODD (Son) notified of bed offer - [facility], TC requested updates from SNF, will continue to followDonyae [facility]

## 2023-07-30 NOTE — ACP (Advance Care Planning)
 Had extensive conversation with patient this morning regarding her code status. Patient states she thought more about being a No code overnight and she states that she does not wish to be a no code any longer.Patient states that she wishes to be a full code and that she wishes for all interventions to be done for her if her heart was to stop beating or she needed assistance with breathing.Of note, patient is alert to person, place and time during this conversation.She asked me to notify her son Tawanna Cooler about the above which I have done and he too is in agreement.Electronically Signed by Ruthy Dick, PA, July 30, 2023

## 2023-07-30 NOTE — Progress Notes
 Orthopaedic Surgery Progress NoteDOS: Pending Surgery: Pending Surgeon: Dr. Donnie Coffin Interim History: NAEON. Patient is resting comfortably in bed. Vitals:Temp:  [97.6 ?F (36.4 ?C)-98.1 ?F (36.7 ?C)] 97.6 ?F (36.4 ?C)Pulse:  [61-82] 61Resp:  [16-18] 16BP: (106-146)/(49-65) 112/63SpO2:  [95 %-98 %] 95 %SpO2:  [95 %-98 %] 95 % (09/22 2331) Exam:NAD, AlertComfortableRLE:Intact EHL/FHL/TA/GastrocSILT in spn/dpn/t n. Distribution Skin intact over right hip Toes wwp H/H:Recent Labs   09/22/240845 HGB 10.8* HCT 32.70* ZOX:WRUEAV Labs   09/22/240845 WBC 5.2 Coags:Recent Labs   09/22/241108 PTT 32.9* INR 1.18* XR:[place] Pelvis wo IV ContrastResult Date: 9/22/2024CT PELVIS WO IV CONTRAST HISTORY: concern over right hip fracture COMPARISON: Hip x-ray dated July 29, 2023 TECHNIQUE: [place] images of pelvis were obtained without contrast. FINDINGS: BOWEL: Diverticulosis without evidence of diverticulitis. The appendix is surgically removed. PERITONEUM: Unremarkable. LYMPH NODES: Unremarkable. VESSELS: Limited evaluation shows diffuse atherosclerotic disease of the aorta. URINARY BLADDER: Unremarkable. PELVIC VISCERA: Streak artifact from left hip arthroplasty is partially limits evaluation pelvic viscera. Within these limitations, there is no obvious abnormality. BONES & SOFT TISSUE: Decreased bone density. Degenerative changes of the visualized lower lumbar spine along with L4-5 laminectomy. Complete subcapital fracture with impaction mild valgus angulation. Femoral head is well-seated within the acetabulum. Moderate degenerative change. 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  Subcapital right femoral fracture. Infiltration of the cutaneous fat posterior lateral to the right intratrochanteric/subtrochanteric region. Subcapital right femoral fracture. [place] Radiology Notify System Classification: Routine. Report initiated by:  Clair Gulling, MD Reported and signed by: Rosey Bath, MD  [facility] Radiology and Biomedical Imaging Lower Leg 2V LeftResult Date: 07/29/2023**FRACTURE** STUDY: XR HIP RIGHT AP AND LATERAL, XR TIBIA FIBULA LEFT AP AND LATERAL INDICATION: s/p fall COMPARISON: Left hip radiograph dated November 14, 2018. FINDINGS: Right hip: Subcapital right femoral fracture with impaction and subcapital mild valgus angulation. Mild degenerative changes. Left hip: Postsurgical changes of left hip arthroplasty. No evidence of hardware complication. No fractures or dislocations. Chronic degenerative changes. Left tibia/fibula: No fracture or dislocation. Patient status post left knee replacement. No prosthetic or periprosthetic fracture. No evidence for loosening. Subcapital right femoral fracture. [place] Radiology Notify System Classification: Routine. (accession [id number]), Routine. (accession [id number]) Report initiated by:  Dorina Hoyer, MD Reported and signed by: Rosey Bath, MD  [facility] Radiology and Biomedical Imaging XR Hip RightResult Date: 07/29/2023**FRACTURE** STUDY: XR HIP RIGHT AP AND LATERAL, XR TIBIA FIBULA LEFT AP AND LATERAL INDICATION: s/p fall COMPARISON: Left hip radiograph dated November 14, 2018. FINDINGS: Right hip: Subcapital right femoral fracture with impaction and subcapital mild valgus angulation. Mild degenerative changes. Left hip: Postsurgical changes of left hip arthroplasty. No evidence of hardware complication. No fractures or dislocations. Chronic degenerative changes. Left tibia/fibula: No fracture or dislocation. Patient status post left knee replacement. No prosthetic or periprosthetic fracture. No evidence for loosening. Subcapital right femoral fracture. [place] Radiology Notify System Classification: Routine. (accession [id number]), Routine. (accession [id number]) Report initiated by:  Dorina Hoyer, MD Reported and signed by: Rosey Bath, MD  [facility] Radiology and Biomedical Imaging A/P: 82 y.o. female pending surgical stabilization of right femoral next fracture- Lovenox post-operatively.  - NPO - Bedrest/non-weight bearing - Medical clearance to be done by the fracture hospitalist - Vascular surgery consulted. CTA completed and reviewed. No barriers to surgery              - RCRI 0, 3.9% for peri-cardiac event - Patient marked, Booked and consented-Dispo: Pending OR [facility] - JointsWeekdays (7:00 am - 4:30 pm pm Mon-Fri):  Ortho Joints PA (Check Covering Provider or call [phone])Diane Mitsuko Luera (PGY3), Giscard Adeclat (PGY5), Wilfred Lacy (Fellow)Weeknights (4:30 pm - 7:00 am Sun-Thurs) Weekends (Friday after 4:30 pm, All-day Saturday, and Sunday: Check Covering Provider

## 2023-07-30 NOTE — Other
 ORTHOPAEDICS & REHABILITATION Pre-op NotePatient name:  Holly Erickson MRN:	  [id number] Date of birth:	  01-25-1942Date of visit:    9/23/2024Attending of record for this patient: Dr. Nedra Hai RubinProvider leaving this note:	 Ruthy Dick, PAProcedure - right hip HemiarthroplastyTXA Eligible - yesNPO Status Confirmed - yesBooked - yesConsented - yesStaph PCR - positivePCN/Cephalosporin Allergy- No  Vanco Ordered - No  Medical Clearance Complete - YesHSQ held 6 hours prior to surgery - yesHeart and lung exam included in daily note - yes Med reconciliation  complete - yesElectronically Signed by Ruthy Dick, PA, July 30, 2023

## 2023-07-30 NOTE — Progress Notes
 ORTHOPAEDICS & REHABILITATION	Orthopedic Surgery Progress Note - Adult Reconstruction ServiceAttending Provider: Cassandria Anger, MD DOS: PendingS: Reports no issues overnight. Pain ok if not moving, otherwise significant pain. Discussed surgery today. O:Vitals:Temp:  [97.5 ?F (36.4 ?C)-98.1 ?F (36.7 ?C)] 97.5 ?F (36.4 ?C)Pulse:  [59-82] 59Resp:  [16-18] 18BP: (106-146)/(49-65) 107/63SpO2:  [94 %-98 %] 94 %Device (Oxygen Therapy): nasal cannulaO2 Flow (L/min):  [2] 2Physical Exam:[id number]:Skin intact around R hip regionSensation present grossly tn/sp/dpMotor: +ehl, fhl, ta, gastrosoleusFoot/toes wwpLabs:Lab Results Component Value Date  WBC 9.1 07/30/2023  HGB 9.7 (L) 07/30/2023  HCT 30.00 (L) 07/30/2023  MCV 96.2 07/30/2023  PLT 101 (L) 07/30/2023 Lab Results Component Value Date  CREATININE 0.80 07/30/2023  BUN 17 07/30/2023  NA 138 07/30/2023  K 4.3 07/30/2023  CL 109 (H) 07/30/2023  CO2 22 07/30/2023 Lab Results Component Value Date  INR 1.18 (H) 07/29/2023 A/P: Holly Erickson is a 82 y.o. with R femoral neck fx s/p fall. We had a thorough discussion about treatment options. She elects to undergo surgical treatment with hemi vs total hip arthroplasty. The nature of the procedure was discussed. - Weight Bearing: NWB- DVT Prophylaxis: Lovenox (post-op), SCDs, mobilization- Appreciate medicine & vascular clearance/recs- Diet: NPO (IVF)- Pain Control- Dispo: OR today for R hemi vs total hip arthroplastySigned:Marquite Attwood Legrand Rams, MD

## 2023-07-30 NOTE — Plan of Care
 Plan of Care Overview/ Patient Status    Problem: Adult Inpatient Plan of CareGoal: Readiness for Transition of Care9/23/2024 1551 by Roderic Scarce, RNOutcome: Interventions implemented as appropriatePASRR submitted and approved. Roderic Scarce, MSN, RNCase ManagerYNHH

## 2023-07-30 NOTE — Anesthesia Procedure Notes
 Room and Bed: [facility] PERIOP , Pool  Current Location: [facility] OR Neuraxial Block:Date/Time: 07/30/2023 5:45 PM intrathecalPain Diagnosis/Location: Right Hip       Requesting Physician: Arlana Lindau, MDPre-procedure Checklistpatient identified; site marked; surgical consent reviewd; pre-op evaluation performed; Universal Protocol/timeout performed; IV checked; monitors and equipment checked; informed consent obtained and options, plan, risks & benefits discussed with patientUniversal ProtocolUniversal protocol documented by nurse: yesPrep: chloraprepNeedle A:  intrathecalLaterality: N/AInjection technique: single-shotNeedle: Quincke      Gauge: 22 G      Length: 3.5 cmEvent(s): blood not aspirated, injection not painful, no injection resistance, cerebrospinal fluid, no paresthesia and no other eventPerformed By: , personally Patient's position for procedure:left lateral decubitusOutcome:  successful blockAttempts:  block completePatient tolerated block procedure well?:  yes

## 2023-07-31 ENCOUNTER — Encounter: Admit: 2023-07-31 | Payer: PRIVATE HEALTH INSURANCE | Attending: Orthopedic Surgery | Primary: Internal Medicine

## 2023-07-31 DIAGNOSIS — K5900 Constipation, unspecified: Secondary | ICD-10-CM

## 2023-07-31 DIAGNOSIS — K635 Polyp of colon: Secondary | ICD-10-CM

## 2023-07-31 DIAGNOSIS — I499 Cardiac arrhythmia, unspecified: Secondary | ICD-10-CM

## 2023-07-31 DIAGNOSIS — E785 Hyperlipidemia, unspecified: Secondary | ICD-10-CM

## 2023-07-31 DIAGNOSIS — F32A Depression: Secondary | ICD-10-CM

## 2023-07-31 LAB — BASIC METABOLIC PANEL
BKR ANION GAP: 9 (ref 7–17)
BKR BLOOD UREA NITROGEN: 18 mg/dL (ref 8–23)
BKR BUN / CREAT RATIO: 22.5 (ref 8.0–23.0)
BKR CALCIUM: 7.8 mg/dL — ABNORMAL LOW (ref 8.8–10.2)
BKR CHLORIDE: 109 mmol/L — ABNORMAL HIGH (ref 98–107)
BKR CO2: 23 mmol/L (ref 20–30)
BKR CREATININE: 0.8 mg/dL (ref 0.40–1.30)
BKR EGFR, CREATININE (CKD-EPI 2021): >60 mL/min/{1.73_m2} (ref >=60)
BKR GLUCOSE: 99 mg/dL (ref 70–100)
BKR POTASSIUM: 3.8 mmol/L (ref 3.3–5.3)
BKR SODIUM: 141 mmol/L (ref 136–144)

## 2023-07-31 LAB — CBC WITHOUT DIFFERENTIAL
BKR WAM ANC (ABSOLUTE NEUTROPHIL COUNT): 4.44 x 1000/ÂµL (ref 2.00–7.60)
BKR WAM HEMATOCRIT (2 DEC): 24.7 % — ABNORMAL LOW (ref 35.00–45.00)
BKR WAM HEMOGLOBIN: 8.1 g/dL — ABNORMAL LOW (ref 11.7–15.5)
BKR WAM MCH (PG): 31.4 pg (ref 27.0–33.0)
BKR WAM MCHC: 32.8 g/dL (ref 31.0–36.0)
BKR WAM MCV: 95.7 fL (ref 80.0–100.0)
BKR WAM MPV: 12.4 fL — ABNORMAL HIGH (ref 8.0–12.0)
BKR WAM PLATELETS: 88 x1000/ÂµL — ABNORMAL LOW (ref 150–420)
BKR WAM RDW-CV: 15 % (ref 11.0–15.0)
BKR WAM RED BLOOD CELL COUNT.: 2.58 M/ÂµL — ABNORMAL LOW (ref 4.00–6.00)
BKR WAM WHITE BLOOD CELL COUNT: 6.5 x1000/ÂµL (ref 4.0–11.0)

## 2023-07-31 LAB — IMMATURE PLATELET FRACTION (BH GH LMW YH)
BKR WAM IPF, ABSOLUTE: 10.5 x1000/ÂµL (ref <20.0)
BKR WAM IPF: 11.9 % — ABNORMAL HIGH (ref 1.2–8.6)

## 2023-07-31 MED ORDER — OXYCODONE IMMEDIATE RELEASE 5 MG TABLET
5 | ORAL | Status: DC | PRN
Start: 2023-07-31 — End: 2023-07-31

## 2023-07-31 MED ORDER — LACTULOSE 20 GRAM/30 ML ORAL SOLUTION
2030 gram/30 mL | Freq: Two times a day (BID) | ORAL | Status: DC | PRN
Start: 2023-07-31 — End: 2023-08-01

## 2023-07-31 MED ORDER — MAGNESIUM HYDROXIDE 400 MG/5 ML ORAL SUSPENSION
4005 mg/5 mL | Freq: Every day | ORAL | Status: DC | PRN
Start: 2023-07-31 — End: 2023-08-01

## 2023-07-31 MED ORDER — MORPHINE 15 MG IMMEDIATE RELEASE TABLET
15 mg | ORAL | Status: DC | PRN
Start: 2023-07-31 — End: 2023-08-01
  Administered 2023-07-31 – 2023-08-01 (×4): 15 mg via ORAL

## 2023-07-31 NOTE — Other
 Wound consult placed for bilateral heel pressure injuries. Patient with intact blanchable heels, no pressure injury at this time.  Arleta Creek, RN CWON9/24/202412:10 PM

## 2023-07-31 NOTE — Other
 Post Anesthesia Transfer of Care NotePatient: Holly RalstonProcedure(s) Performed: Procedure(s) (LRB):RIGHT TOTAL HIP ARTHROPLASTY (Right)Last Vitals: I have reviewed the post-operative vital signs during the handoff as noted in the Epic chart.POSTOP HANDOFF :      Patient Location:  PACU     Level of Consciousness:  Awake     VS stable since last recorded intra-op set? Yes       Oxygen source: maskPatient co-morbidities, intra-operative course, intake & output and antibiotics as per Anesthesia record were discussed with the RN.

## 2023-07-31 NOTE — Care Coordination-Inpatient
 PENDING INSURANCE AUTHORIZATION THIS PATIENT CANNOT BE DISCHARGED UNTIL DETERMINATION OBTAINEDSecured facility: [facility]	[place] [facility] has been initiated with this patient's insurer. Clinical information submitted to this patient's insurer for review today. We now await the authorization that will allow this patient to transfer to the facility.   Insurance Contact: UHC/Home and Community CarePhone: Fax: PortalReference No.: [id number]

## 2023-07-31 NOTE — Brief Op Note
 [facility]        [id number] Patient DOB: [birth year]     Surgery Date: 9/23/2024Surgeon(s) and Role:   * Arlana Lindau, MD - Primary   * Darvin Neighbours, MD - AssistingAssistant(s):Fellow: Joaquin Music, MDResident: Katha Hamming, MDStaff:  Circulator: Dereck Ligas, RNRelief Circulator: Shellia Carwin, RNRelief Scrub: Janee Morn, Gene Sr.Scrub Person: Carlynn Purl, LuzPre-Op Diagnosis: Closed subcapital fracture of femur, right, initial encounter (HC Code) (HC CODE) (HC Code) [S72.011A] Procedure(s) and Anesthesia Type:   * RIGHT TOTAL HIP ARTHROPLASTY - GENERALOperative Findings (enter relevant operative findings; do not refer to an operative report that is not yet transcribed): 1) Transcervical displaced, comminuted fracture of the right femoral neck2) Osteoarthritis of Right Hip Signs of infection present at the time of surgery at the operative site: None Blood and Blood Products: none                 Drains:  noneImplants: Implant Name Type Inv. Item Serial No. Manufacturer Lot No. LRB No. Used Action SHELL ACET HIP POR CTD 3-HOLE TI EMPHASYS - [id number] Implant SHELL ACET HIP POR CTD 3-HOLE TI EMPHASYS  Ferol Luz [employer] [id number] Right 1 Implanted 52mm x 40mm Emphasys Polyethylene Liner, AOX, Neutral Implant   DEPU[employer], [employer] [id number] Right 1 Implanted SCREW ACTB 6.5X15MM CANC PNCL - [id number] Implant SCREW ACTB 6.5X15MM CANC PNCL  Ferol Luz [employer] [id number] Right 1 Implanted SCREW ACTB 6.5X30MM CANC PNCL - [id number] Implant SCREW ACTB 6.5X30MM CANC PNCL  Ferol Luz [employer] [id number] Right 1 Implanted STEM FEM S11 STD CLLR CORAIL - [id number] Implant STEM FEM S11 STD CLLR Canary Brim [employer] [id number] Right 1 Implanted ARTICUL/EZE FEMORAL HEAD CERAMIC 12/14 TAPER 40mm plus 5 Implant   DEPUY [employer], [employer] [id number] Right 1 Implanted  Specimens: ID Type Source Tests Collected by Time 1 : Right hip bone Tissue Bone PATHOLOGY ([facility]) Arlana Lindau, MD 07/30/2023  4:09 PM  Clinical Staging: stableEBL: 50 mL      Post Operative Diagnosis: 1) Transcervical displaced, comminuted fracture of the right femoral neck2) Osteoarthritis of Right Hip  Postop Plan: Issues: s/p R THAFoley: noneXrays: pacuPain control: multimodalDischarge planning: home vs rehab per PTDrain: noneSutures/staples: stratafix/dermabondFollow-up: Dr. Donnie Coffin as scheduledPostoperative Disposition: To PACU, Stable Condition. Postoperative DVT Prophylaxis: Multi-Modal Protocol, with Eliquis 2.5mg  PO BID x 5 weeks   Postoperative Rehab Protocol: WBAT, ROM as tolerated. RW for Fall Prevention. Postoperative Antibiotic Protocol: - Complete 2 more dose(s) of IV Ancef per usual protocols. Postoperative Surgical Dressing: Dermabond and Mepilex, Remove Cover dressing in 7 daysMuhammad T Aquilla Voiles, MD9/23/20248:15 PM

## 2023-07-31 NOTE — Plan of Care
 Holly Erickson					Location: VE4E/E420-82 y.o., female				Attending: Arlana Lindau, MD	Admit Date: 07/29/2023			[id number] LOS: 2 days Initial Nutrition AssessmentNutrition Recommendations:Continue regular diet as orderedRecommend Ensure plus BID to supplement kcal intake (each 8 oz ons provides 350 kcal, 13 g protein)Consider multivitamin with trace mineralsEncourage adequate kcal and fluid intake on the floor as you areReason For Assessment: nurse/nurse practitioner consultHPI: Admitted 07/29/2023 r/t closed fracture of hip after mechanical fallS/p 07/30/23 right THARelated Health history: 82 y.o.female. Relevant PMH per chart review: ascending aorta aneurysms, HTN, HLDSubjective: Spoke with pt in room. Pt reports good appetite. States her appetite has improved since admission. States her appetite has declined in last few years, atributed to age. Pt reports having ~2-3 meals per day. States she will sometimes replace lunch with Boost. Agreeable to Ensure in house, observed pt consuming Ensure plus. Pt denies issues chewing/swallowing. States her BM are typical, taking Miralax as ordered per pt. Per nutrition flowsheet, pt has good diet tolerance. Meal intakes recorded since admission = 0%. RN note signed 9/24 reports poor appetite but adequate fluid intake. Does not appear pt is meeting >75% ENN. Will add ONS. Per wt hx in chart, pt appears to be around UBW.Current Diet Order: Diet RegularI/Os: LBM 07/28/23, constipation noted in stool documentation Intake/Output Summary (Last 24 hours) at 07/31/2023 0756Last data filed at 07/30/2023 2002Gross per 24 hour Intake 3118.98 ml Output 850 ml Net 2268.98 ml Diet History: 9/23 Regular9/23 NPO9/22 Cardiac Food Allergies: Allergies Allergen Reactions  Sodium Pentothal [Thiopental Sodium]    Childhood  Duragesic [Fentanyl] Itching and Dizziness   PATCH ONLY.. Oral form is ok Cultural/Religious/Ethnic Needs: NoSkin: Braden score = 15. Stage I PI to left heel, DTPI to right heel in skin flowsheet.Medications: NaCl 75 ml/hr continuously, cholecalciferol, iron dextran in NaCl, rosuvastatin, senna, zinc sulfate, others reviewed. Labs: Recent Labs   09/22/240845 09/23/240732 09/24/240546 NA 139 138 141 K 3.8 4.3 3.8 MG 1.9 1.9  --  CREATININE 0.90 0.80 0.80 BUN 13 17 18  9/24 Glu 99 WNLAnthropometrics:Height: 5' (1.524 m)Current weight: 50.2 kgBMI: Body mass index is 21.61 kg/m?Marland KitchenWeight History: Wt Readings from Last 20 Encounters: 07/29/23 50.2 kg 02/20/23 50.3 kg 11/07/22 52.2 kg 12/06/18 49 kg 08/06/18 49 kg 08/02/18 49 kg Physical findings: s/sx of wasting appear consistent with ageESTIMATED NUTRITION REQUIREMENTS: Assessed on 07/31/2023 Kcal/day: 1506-1757 kcal		(30-35 kcal/kg)Protein/day: 62-75 grams		(1.25-1.5 gm/kg)Fluids/day: Fluid management per medical team discretion. 8295-6213 ml (14mL/kcal) Needs based on: Admission wt of 50.2 kg. Age, BMI, surgical course considered. Nutrition Diagnosis:	Nutrition Diagnosis/Problem: Inadequate oral intake	Related to: poor appetite	As Evidenced by: RN reports; meal intakes recordedComments: newInterventions:Nutrition Prescription: Regular diet; Ensure plus BIDPlan: Meal and Snacks: General/Healthful dietMedical Food Supplements: Commercial beverageVitamin and Mineral Supplements: Multivitamin/mineral, Multi-trace elementsFeeding Assistance:  (as needed)D/C & transfer of care to new setting or provider:  (anticipate regular diet) Goals: Pt to meet >75% estimated energy needs by next nutritional f/u Pt to consume the equivalent to one or more nutritional supplement daily Monitoring/Evaluation:Food/Nutrition-Related Outcomes:            food and nutrient intake/administration.Anthropometric Outcomes:            Height, weight, BMI, Weight historyBiochemical Data, Medical Tests, and Procedure Outcomes:            Labs (glucose, BMP)Nutrition-Focused Physical Finding Outcomes:            Physical appearance, muscle and fat wasting, swallow function, appetite, skin integrity, GI function.RDN following per [facility] Clinical Nutrition Standards of Care.Registered Dietitian Nutritionist: Electronically signed by Jeanann Lewandowsky MS RD CDN 07/31/2023

## 2023-07-31 NOTE — Plan of Care
 Inpatient Physical Therapy Evaluation IP Adult PT Eval/Treat - 07/31/23 1210    Date of Visit / Treatment  Date of Visit / Treatment 07/31/23   Note Type Evaluation   Progress Report Due 08/10/23   Start Time 1130   End Time 1210   Total Treatment Time 40    General Information  Pertinent History Of Current Problem Pt is a 82 y.o. female adm to [facility] 07/29/23 with R femoral neck fx s/p fall now s/p Right Hip THA   Subjective Pt alert and agreeable to therapy   Referring Physician  Dr Donnie Coffin   General Observations Pt received resting in bedside recliner, on room air, NAD   Precautions/Limitations Fall Precautions;Right hip precautions;Anterior hip   Precautions/Limitations Comment WBAT RLE    Weight Bearing Status  Weight Bearing Status Comments WBAT RLE    Prior Level of Functioning/Social History  Additional Comments Pt lives alone in an apartment with elevator access. PTA pt independent with ADLs, amb with SC as needed, son is available and supportive    Vital Signs and Orthostatic Vital Signs  Vital Signs Free text On room air, NAD    Pain/Comfort  Pain Location - Side Right   Pain Location hip   Pain Comment (Pre/Post Treatment Pain) pt with mod pain in R hip, premedicated, RN aware    Patient Coping  Observed Emotional State accepting   Verbalized Emotional State acceptance    Cognition  Overall Cognitive Status Within Functional Limits   Orientation Level Oriented X4   Level of Consciousness alert   Following Commands Follows all commands and directions without difficulty    Vision/ Hearing  Vision Assessment Results No vision deficits noted    Range of Motion  Range of Motion Examination WFL except   Range of Motion Comments R hip limited by pain    Manual Muscle Testing  Manual Muscle Testing Results WFL except   Manual Muscle Testing Comments RLE grossly at least 3-/5; [facility] for mobility    Muscle Tone  Muscle Tone Testing Results No muscle tone deficits noted    Sensory Assessment  Sensory Tests Results No sensory impairment noted    Skin Assessment  Skin Assessment See Nursing Documentation    Balance  Sitting Balance: Static  FAIR+     Maintains static position without assist or device, may require Supervision or Verbal Cues (>2 minutes)   Sitting Balance: Dynamic  FAIR      Performs dynamic activities through 75% range Contact Guard or partial range (50-75%) with Supervision   Standing Balance: Static FAIR-      Contact Guard to maintain static position with no Assistive Device   Standing Balance: Dynamic  POOR+   Moves through 1/2 range with minimal assist to right self   Balance Assist Device Rolling walker   Balance Skills Training Comment Ax1    Bed Mobility  Bed Mobility Comments Pt received and remained OOB    Sit-Stand Transfer Training  Sit-to-Stand Transfer Independence/Assistance Level Minimum assist;Assist of 1   Sit-to-Stand Transfer Assist Device Rolling walker   Stand-to-Sit Transfer Independence/Assistance Level Minimum assist;Assist of 1   Stand-to-Sit Transfer Assist Device Rolling walker   Transfer Safety Analysis Impairments pain   Sit-Stand Transfer Comments Cues for hand placement and sequencing    Gait Training  Independence/Assistance Level  Minimum assist;Assist of 1   Assistive Device  Rolling walker   Gait Distance 30 feet   Gait Analysis Deviations decreased cadence;decreased step length;decreased stride length;decreased  toe-to-floor clearance;decreased weight-shifting ability;antalgic gait   Gait Analysis Impairments impaired balance;decreased flexibility;pain;decreased strength;decreased endurance/activity tolerance   Gait Training Comments Decr step length and cadence, heavy reliance on BUE for support, easily fatigued, limited by pain, chair follow for safety    Handoff Documentation  Handoff Patient in chair;Chair alarm;Pressure relief cushion;Patient instructed to call nursing for mobility;Discussed with nursing    Activity Tolerance  Activity Tolerance Comments Fair    PT- AM-PAC - Basic Mobility Screen- How much help from another person do you currently need.....  Turning from your back to your side while in a a flat bed without using rails? 3 - A Little - Requires a little help (supervision, minimal assistance). Can use assistive devices.   Moving from lying on your back to sitting on the side of a flat bed without using bed rails? 3 - A Little - Requires a little help (supervision, minimal assistance). Can use assistive devices.   Moving to and from a bed to a chair (including a wheelchair)? 3 - A Little - Requires a little help (supervision, minimal assistance). Can use assistive devices.   Standing up from a chair using your arms(e.g., wheelchair or bedside chair)? 3 - A Little - Requires a little help (supervision, minimal assistance). Can use assistive devices.   To walk in a hospital room? 3 - A Little - Requires a little help (supervision, minimal assistance). Can use assistive devices.   Climbing 3-5 steps with a railing? 1 - Total - Requires total assistance or cannot do it at all.   AMPAC Mobility Score 16   TARGET Highest Level of Mobility Mobility Level 5, Stand for 1 minute   ACTUAL Highest Level of Mobility Mobility Level 7, Walk 25+ feet    Clinical Impression  Initial Assessment Pt tolerated therapy eval well; pt limited by decr strength, balance and activity tolerance. Pt mobilizing with Ax1 and RW. Pt will require mod complexity support and therapy services upon d/c   Criteria for Skilled Therapeutic Interventions Met yes;treatment indicated   Rehab Potential good, to achieve stated therapy goals    Patient/Family Stated Goals  Patient/Family Stated Goal(s) decrease pain    Frequency/Equipment Recommendations  PT Frequency Daily   Next Treatment Expected 08/01/23   PT/PTA completing this assessment Meg M   Equipment Needs During Admission/Treatment Rolling walker    PT Recommendations for Inpatient Admission  Activity/Level of Assist out of bed;ambulate;assist of 1;with rolling walker    Education - Learning Assessment   Education Topic Functional mobility techniques;Precautions;Therapy role/rehab process;Discharge planning   Learners Patient   Readiness Eager;Acceptance   Method Explanation;Demonstration   Response Verbalizes Understanding;Demonstrated Understanding    PT Discharge Summary  Physical Therapy Disposition Recommendation Moderate complexity support and therapy to progress functional mobility/ ADLs/ IADLs recommended for post- acute care.  See assessment for additional details.   Equipment Recommendations for Discharge To be determined pending progress     Problem: Physical Therapy GoalsGoal: Physical Therapy GoalsDescription: PT GOALS1. Patient will perform bed mobility with minimal assist2. Patient will perform transfers with minimal assist using appropriate assistive device 3. Patient will ambulate a minimum of 150 feet with minimal assist using appropriate assistive device 4. Patient will ascend and descend at least 5 steps with minimal assist using railOutcome: Initial problem identification Cira Rue, PT, DPT

## 2023-07-31 NOTE — Anesthesia Post-Procedure Evaluation
 Anesthesia Post-op NotePatient: Holly RalstonProcedure(s):  Procedure(s) (LRB):RIGHT TOTAL HIP ARTHROPLASTY (Right) Last Vitals:  I have reviewed the post-operative vital signs as noted in the Epic chart.POSTOP EVALUATION:      Patient Recovery Location:  PACU     Vital Signs Status:  Stable     Patient Participation:  Patient participated     Mental Status:  Awake, alert and oriented     Respiratory Status:  Acceptable     Airway Patency:  Patent     Cardiovascular/Hydration Status:  Stable     Pain Management:  Satisfactory to patient     Nausea/Vomiting Status:  Satisfactory to patientThere were no known notable events for this encounter.

## 2023-07-31 NOTE — Other
 Operative Diagnosis:Pre-op:   Closed subcapital fracture of femur, right, initial encounter (HC Code) (HC CODE) (HC Code) [S72.011A] Patient Coded Diagnosis   Pre-op diagnosis: Closed subcapital fracture of femur, right, initial encounter (HC Code) (HC CODE) (HC Code)  Post-op diagnosis: Closed subcapital fracture of femur, right, initial encounter (HC Code) (HC CODE) (HC Code)  Patient Diagnosis   Pre-op diagnosis: Closed subcapital fracture of femur, right, initial encounter (HC Code) (HC CODE) (HC Code) [S72.011A]  Post-op diagnosis:     Post-op diagnosis:   * Closed subcapital fracture of femur, right, initial encounter (HC Code) (HC CODE) (HC Code) [S72.011A]Operative Procedure(s) :Procedure(s) (LRB):RIGHT TOTAL HIP ARTHROPLASTY (Right)Post-op Procedure & Diagnosis ConfirmationPost-op Diagnosis: Post-op Diagnosis confirmed (no changes)Post-op Procedure: Post-op Procedure confirmed (no changes)

## 2023-07-31 NOTE — Other
 ORTHOPEDIC POST-OP CHECK:Procedure: Right Hip THA for fractureSurgeon: Dr. Donnie Coffin Anesthesia: General Subjective: 82 y.o. female is feeling well and reports that her pain is controlled with pain medication. She denies chest pain or shortness of breath. She also denies nausea, vomiting, or abdominal pain.Vitals:BP Readings from Last 1 Encounters: 07/30/23 123/74  Pulse Readings from Last 1 Encounters: 07/30/23 71  Resp Readings from Last 1 Encounters: 07/30/23 18  Temp Readings from Last 1 Encounters: 07/30/23 98.3 ?F (36.8 ?C) (Oral)  SpO2 Readings from Last 1 Encounters: 07/30/23 96% Labs:Lab Results Component Value Date  HGB 9.7 (L) 07/30/2023 Lab Results Component Value Date  HCT 30.00 (L) 07/30/2023 Lab Results Component Value Date  WBC 9.1 07/30/2023 Lab Results Component Value Date  BUN 17 07/30/2023 Lab Results Component Value Date  CREATININE 0.80 07/30/2023 Lab Results Component Value Date  NA 138 07/30/2023  K 4.3 07/30/2023  CL 109 (H) 07/30/2023  CO2 22 07/30/2023 Estimated Creatinine Clearance: 38 mL/min (by C-G formula based on SCr of 0.8 mg/dL).Physical Exam:General: Alert and comfortable, no acute distressHeart: RRRLungs: CTAB, no wheezes or crackles appreciatedAbdomen: soft, NT, ND, +BSRight Hip:        Dressing is clean, dry, and intact        Distal sensation is grossly intact to light touch        Calves are soft and non-tender, compressible        Palpable 2+ dorsalis pedis pulse        Toes are warm and well-perfused, capillary refill <2 seconds         Intact tibialis anterior, extensor hallucis longus, flexor hallucis longus, and gastrocnemiusDiagnostics:Portable pelvis x-ray in the PACU confirms no acute periprosthetic fracture or dislocationAssessment: POD#0 s/p Right Hip THA for fractureBody mass index is 21.61 kg/m?Marland KitchenPlan: 1) IV Kefzol for 24 hours 2) WBAT to RLE with PT3) VTE Prophylaxis: Eliquis 2.5mg  BID x 35 days, compressive devices, ambulation4) Continue home medications5) DTV6) Pain control7) Diet as tolerated8) Bowel regimen9) Neurovascular check to RLE q 4 hrs10) Appreciate medicine recommendations11) Dispo planningElectronically Signed by Heywood Iles, APRN, September 23, 2024SRC Fracture Service can be contacted on [facility] Fract Serv Prov Dynamic Role at [phone]

## 2023-07-31 NOTE — Progress Notes
 ORTHOPAEDICS & REHABILITATION	Orthopedic Surgery Progress Note - Adult Reconstruction ServiceAttending Provider: Arlana Lindau, MD DOS: r tha 07/30/23 RubinS: Viona Gilmore. Denies cp sob n v O:Vitals:Temp:  [97.3 ?F (36.3 ?C)-98.3 ?F (36.8 ?C)] 98.3 ?F (36.8 ?C)Pulse:  [59-92] 68Resp:  [17-24] 18BP: (106-149)/(43-88) 118/65SpO2:  [94 %-99 %] 98 %Device (Oxygen Therapy): nasal cannulaO2 Flow (L/min):  [2-10] 2Physical Exam:[id number]:Skin intact around R hip regionSensation present grossly tn/sp/dpMotor: +ehl, fhl, ta, gastrosoleusFoot/toes wwpLabs:Lab Results Component Value Date  WBC 9.1 07/30/2023  HGB 9.7 (L) 07/30/2023  HCT 30.00 (L) 07/30/2023  MCV 96.2 07/30/2023  PLT 101 (L) 07/30/2023 Lab Results Component Value Date  CREATININE 0.80 07/31/2023  BUN 18 07/31/2023  NA 141 07/31/2023  K 3.8 07/31/2023  CL 109 (H) 07/31/2023  CO2 23 07/31/2023 Lab Results Component Value Date  INR 1.18 (H) 07/29/2023 A/P: Holly Erickson is a 82 y.o. with R femoral neck fx s/p fall now s/p Right Hip THA 07/30/23 w Dr Donnie Coffin  Plan: 1) IV Kefzol for 24 hours 2) WBAT to RLE with PT3) VTE Prophylaxis: Eliquis 2.5mg  BID x 35 days, compressive devices, ambulation4) Continue home medications5) DTV6) Pain control7) Diet as tolerated8) Bowel regimen9) Neurovascular check to RLE q 4 hrs10) Appreciate medicine recommendations11) Dispo planningSigned:Rosmarie Esquibel Lavada Mesi, MD

## 2023-07-31 NOTE — Plan of Care
 Plan of Care Overview/ Patient Status    Patient is s/p total right hip arthroplasty.A&Ox4 but forgetful.On 2L NC Patient attempted to ambulate alone with SCD's on Moved patient to rm.420 due to the fall risk, bed alarm on.Right hip hydrocolloid clean, dry, intact.Patient has some numbness in BLLE at this time.+DP+Dorsiflexion/ plantar flexion.Pain controlled with oxy 5mg  prn q3hrs.Will continue to assess.

## 2023-07-31 NOTE — Plan of Care
 Problem: Adult Inpatient Plan of CareGoal: Plan of Care ReviewOutcome: Interventions implemented as appropriate Plan of Care Overview/ Patient Status    Alert and oriented x4. Anxious at times.Poor appetite but with adequate fluid intake.Safety checks done. Care plan explained. Assisted with ADLs.Fall precautions maintained. Bed/chair alarms active.Turned/repositioned frequently. Heels elevated. Chair pad in use.+cms + dp pulse on right leg. Dressing CDI. Ice bag and bilateral SCD in use.No nausea/vomiting. Voids in bathroom.Floydene Flock RN

## 2023-07-31 NOTE — Care Coordination-Inpatient
 TOD[facility] (Son) accepted bed - [facility]	[place], facility updated.[facility] Transition Coordinator

## 2023-07-31 NOTE — Progress Notes
 Hospitalist Medicine Progress NoteAttending Provider: Arlana Lindau, MD [phone]: Chief Complaint: Follow up R fem neck fxInterim History: Holly Erickson had R hip THA yesterday w Dr. Donnie Coffin. She denies SOB, CP, N/V, fever or chills. She reports pain in R hip. She is accompanied by her family. All other ROS reviewed and negative.Reviewed PMH, PSH, allergies.Objective: Current medications: Current Facility-Administered Medications Medication Dose Route Frequency Provider Last Rate Last Admin  acetaminophen (TYLENOL) tablet 975 mg  975 mg Oral Q6H Padela, Graceann Congress, MD   975 mg at 07/31/23 0518  apixaban (ELIQUIS) tablet 2.5 mg  2.5 mg Oral Q12H Padela, Graceann Congress, MD   2.5 mg at 07/30/23 2318  bupivacaine PF 0.5% (MARCAINE) 0.5 % (5 mg/mL) injection           buPROPion XL (WELLBUTRIN XL) 24 hr tablet 150 mg  150 mg Oral Daily Joaquin Music, MD   150 mg at 07/31/23 0856  cholecalciferol (vitamin D3) tablet 2,000 Units  2,000 Units Oral Daily Joaquin Music, MD   2,000 Units at 07/31/23 0858  DULoxetine (CYMBALTA) DR capsule 60 mg  60 mg Oral Daily Joaquin Music, MD   60 mg at 07/31/23 0856  gabapentin (NEURONTIN) capsule 300 mg  300 mg Oral Nightly Joaquin Music, MD   300 mg at 07/29/23 2126  iron dextran (INFED) 1,000 mg in sodium chloride 0.9% 500 mL IVPB  1,000 mg Intravenous Once Joaquin Music, MD 125 mL/hr at 07/31/23 0612 1,000 mg at 07/31/23 0612  lidocaine-EPINEPHrine 1 %-1:100,000 injection           melatonin tablet 6 mg  6 mg Oral Nightly Joaquin Music, MD   6 mg at 07/29/23 2126  polyethylene glycol (MIRALAX) packet 17 g  17 g Oral BID Joaquin Music, MD   17 g at 07/31/23 0858  rosuvastatin (CRESTOR) tablet 20 mg  20 mg Oral Daily Joaquin Music, MD   20 mg at 07/31/23 0858  senna (SENOKOT) tablet 17.2 mg  2 tablet Oral BID Joaquin Music, MD   17.2 mg at 07/31/23 0857  sodium chloride 0.9 % flush 3 mL  3 mL IV Push Q8H Padela, Graceann Congress, MD   3 mL at 07/31/23 0519  sodium chloride 0.9 % flush 3 mL  3 mL IV Push Q8H Padela, Graceann Congress, MD   3 mL at 07/31/23 0519  tranexamic acid (CYKLOKAPRON) 1,000 mg/10 mL (100 mg/mL) injection           tranexamic acid (CYKLOKAPRON) 1,000 mg/10 mL (100 mg/mL) injection           vancomycin (VANCOCIN) 1,000 mg injection           zinc sulfate (ZINCATE) capsule 220 mg  220 mg Oral Daily Padela, Graceann Congress, MD   220 mg at 07/31/23 0858 Home medications:Vit c dailyGabapentin 300mg  QHSASA 81mg  dailyWellbutrin 100mg  dailyCymbalta 60mg  dailyMVI dailyMelatonin 5mg  qpmCrestor 40mg  dailyZinc dailyHeld: Vit c dailyASA 81mg  dailyMVI dailyZinc dailyDVT prophylaxis: eliquisDiet: RegularFoley: NoneLast bowel movement: 9/21 x1I/O's:Gross Totals (Last 24 hours) at 07/31/2023 0922Last data filed at 07/30/2023 2002Intake 3118.98 ml Output 850 ml Net 2268.98 ml Physical Exam: BP 118/62  - Pulse 65  - Temp 97.4 ?F (36.3 ?C) (Oral)  - Resp 18  - Ht 5' (1.524 m)  - Wt 50.2 kg  - SpO2 98%  - BMI 21.61 kg/m? Oxygen: 93% on room airFSBG 99Access: PIVAppearance: female, sitting up in chair, in NAD ENT: O/P clear, MM  moist, neck supple Cardiovascular: RRR S1S2 no murmur Respiratory: LS clear GI: abdomen soft, NT, ND positive BS Musculoskelatal: R hip dressing clean and dry Skin: No rashes Vascular: No edema Neurologic: awake, alert x3, CAM negative Psychiatric: normal affectLabs:Recent Results (from the past 24 hour(s)) Pathology [facility])  Collection Time: 07/30/23  4:09 PM Result Value Ref Range  Pathology     Pathology received one specimen container for the order. Received specimens are entering processing workflow. Surgical case     [facility])  Collection Time: 07/30/23  4:09 PM Result Value Ref Range  Surgical Case [Specimen Received - Evaluation in Progress]  CBC without differential  Collection Time: 07/31/23  5:46 AM Result Value Ref Range  WBC 6.5 4.0 - 11.0 x1000/?L  RBC 2.58 (L) 4.00 - 6.00 M/?L  Hemoglobin 8.1 (L) 11.7 - 15.5 g/dL  Hematocrit 47.82 (L) 95.62 - 45.00 %  MCV 95.7 80.0 - 100.0 fL  MCH 31.4 27.0 - 33.0 pg  MCHC 32.8 31.0 - 36.0 g/dL  RDW-CV 13.0 86.5 - 78.4 %  Platelets 88 (L) 150 - 420 x1000/?L  MPV 12.4 (H) 8.0 - 12.0 fL  ANC (Abs Neutrophil Count) 4.44 2.00 - 7.60 x 1000/?L Basic metabolic panel  Collection Time: 07/31/23  5:46 AM Result Value Ref Range  Sodium 141 136 - 144 mmol/L  Potassium 3.8 3.3 - 5.3 mmol/L  Chloride 109 (H) 98 - 107 mmol/L  CO2 23 20 - 30 mmol/L  Anion Gap 9 7 - 17  Glucose 99 70 - 100 mg/dL  BUN 18 8 - 23 mg/dL  Creatinine 6.96 2.95 - 1.30 mg/dL  Calcium 7.8 (L) 8.8 - 10.2 mg/dL  BUN/Creatinine Ratio 28.4 8.0 - 23.0  eGFR (Creatinine) >60 >=60 mL/min/1.40m2 Immature Platelet Fraction (BH GH LMW YH)  Collection Time: 07/31/23  5:46 AM Result Value Ref Range  Immature Platelet Fraction 11.9 (H) 1.2 - 8.6 %  Absolute Immature Platelet Fraction 10.5 <20.0 x1000/?L GFR: >60Urine macro: NoneMicro: MRSA PCR negative Diagnostics: No results found.ECG/Tele Events: NSR HR 75 q waves in antero septal leads, no new ischemic changesEcho: Normal left ventricular size, wall thickness, systolic function and wall motion. The left ventricular ejection fraction calculated by biplane Simpson's was 64%. Normal right ventricular cavity size and systolic function.  Estimated right ventricular systolic pressure is 65 mmHg compatible with pulmonary hypertension. Visually the left atrium appears normal.  Right atrium is normal in size. Trileaflet aortic valve.  No aortic stenosis.  Mild to moderate aortic regurgitation. Mild mitral regurgitation. Mild tricuspid regurgitation.  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 IVC diameter < 2.1 cm that collapses < 50% with a sniff suggests mildly increased RAP (5-10 mmHg, mean 8 mmHg). No significant pericardial effusion. No prior study available for comparison.Assessment: Holly Erickson is admitted R fem neck fx. She has hx of constipation, MDD, HLD, arrhythima, tremors, smoking. Active Problems: R fem neck fx s/p THA POD #1Risk for delirium Acute blood loss anemia - with iron deficiencyhx of constipation, MDD, HLD, arrhythima, tremors, smoking. Plan: Dc IVFHgb goal >7, no indication for transfusion Wean O2 for O2 sat >90%CTA negative for AAAResume home ASA when surgically stableDvt ppx w eliquis x 5 week Give iron IV x1 Aggressive bowel regimen Vit D level pending, Vit D3 2000u daily indefinitelyWill continue to follow> 35 minutes spent on care of patient, > 50% time spent coordinating careMedical decision making for this patient was  moderate.Contacts: PCP:  Laury Deep, OsamaSigned:Aiya Keach [facility] APPSenior Nurse Practitioner IIIMHB: [phone] call if I can be of any assistance. 07/31/2023

## 2023-07-31 NOTE — Other
 PACU to Floor Nursing Transfer NotePreop Diagnosis: Closed subcapital fracture of femur, right, initial encounter (HC Code) (HC CODE) (HC Code) [S72.011A]Procedure Done: RIGHT TOTAL HIP ARTHROPLASTY - Right Nerve Block: PNB/SPNAny Significant Events Intra-Op: noneAbnormal Assessment in PACU: noneLevel of Consciousness: awakeLast Set of VS:  Vitals:  07/30/23 0814 07/30/23 1328 07/30/23 1517 07/30/23 1955 BP: 107/63 (!) 149/70 135/68 (!) 110/46 Pulse: (!) 59 72 68 71 Resp: 18 18 18 17  Temp: 97.5 ?F (36.4 ?C) 97.6 ?F (36.4 ?C) 97.6 ?F (36.4 ?C) 97.3 ?F (36.3 ?C) TempSrc: Oral Oral Oral Temporal SpO2: 94% 94% 95% 99% Weight:     Height:     Device (Oxygen Therapy): nonrebreather maskOxygen Concentration (%): 2O2 Flow (L/min): 10Baseline Neuro/developmental Status: WDLLabs Collected: N/ASpecial Needs of the Patient: noneAntibiotics: last dose given in OR: ancef 1738 amd gent 1738IV Access: Periph IV 07/30/23 1124 median vein (palm side), left over-the-needle catheter system 22 gauge 1 in length IV Team (Active) SiteCare/Dressing Status/Securement dressing dry and intact 07/30/23 2002 Date Dressing Applied/Changed 07/30/23 07/30/23 1124 Next Date Dressing Change 08/06/2023 07/30/23 1124 Lumen 1 Patency/Care Patent;flushed w/o difficulty 07/30/23 2002 Site Signs asymptomatic without redness, warmth, swelling, pain, palpable cord, streak formation, drainage, dressing intact 07/30/23 2002 Phlebitis 0-->no symptoms 07/30/23 2002 Infiltration/Extravasation Assessment 0-->no symptoms 07/30/23 2002 Patient Education Instructed to keep IV site dry;Instructed to call nurse if fluid leaking from site;Instructed to call nurse if site is painful,red,swollen, burning 07/30/23 2002 Daily Review of Necessity ** completed 07/30/23 2002 IV Fluids:  lactated ringers 100 mL/hr (07/30/23 1459) Pain Assessment: Number Scale (0-10) 07/29/23 2136 Right proximal hip-Pain Rating (0-10): Rest: 4Pain Assessment: Number Scale (0-10) 07/29/23 2136 Right proximal hip-Pain Rating (0-10): Activity: 8 Pain Assessment: Number Scale (0-10) 07/29/23 2136 Right proximal hip-Pharmaceutical Interventions: pain care plan reviewed with patient/caregiver    Body Position: supine, log-rolledHead of Bed (HOB) Positioning: HOB at 30-45 degrees   Time of Last Void or Time that Urinary Catheter was Removed Intra-Op: last bladder scan at: 2000, for 620 ml Additional Info: Pt is alert. VSS. PNB and SPN. Wiggles toes. +DP pulses. Bladder scanned for 620 cc, straight cathed for 600 cc. Xray done in pacu. Contact RN (name and phone number): Vivia Birmingham [phone]

## 2023-07-31 NOTE — Plan of Care
 Plan of Care Overview/ Patient Status    Inpatient Occupational Therapy EvaluationDefault Flowsheet Data (most recent)   IP Adult OT Eval/Treat - 07/31/23 1111    Date of Visit / Treatment  Date of Visit / Treatment 07/31/23   Progress Report Due 08/14/23   Start Time 1031   End Time 1111   Total Treatment Time 40    General Information  Pertinent History Of Current Problem Pt is a 82 y.o. female adm to [facility] 07/29/23 with R femoral neck fx s/p fall now s/p Right Hip THA   Subjective IM DRIVING EVERYONE CRAZY   General Observations IN BR ON TOILET RECEPTIVE TO THERAPY   Precautions/Limitations Anterior hip;Right hip precautions;Fall Precautions;Bed alarm;Chair alarm   Precautions/Limitations Comment WBAT RLE   Fall History yes, reason for admission   Hand Dominance right    Weight Bearing Status  Weight Bearing Status Comments WBAT RLE    Prior Level of Functioning/Social History  Additional Comments LIVES ALONE IN APT ELEVATOR INDPEND W/ AMB USES CANE PRN IDNPEND ADLS SON IS AVAIL AND VERY SUPPORTIVE    Vital Signs and Orthostatic Vital Signs  Vital Signs Free text RA NAD    Pain/Comfort  Pain Location - Side Right   Pain Location hip   Pain Comment (Pre/Post Treatment Pain) MINPAIN RN AWARE    Cognition  Orientation Level Oriented to person;Oriented to place;Oriented to time   Level of Consciousness alert   Following Commands Follows one step commands with increased time   Personal Safety / Judgment Fall risk;Decreased recognition / insight of own deficits;Decreased awareness of need for safety;Decreased awareness of need for assistance   Attention Requires cues;Requires redirection    Vision/ Hearing  Vision Assessment Results No vision deficits noted    Range of Motion  Range of Motion Examination bilateral upper extremity ROM was [facility]    Manual Muscle Testing  Manual Muscle Testing Comments BUE AT LEAST 3+/5 Muscle Tone  Muscle Tone Testing Results No muscle tone deficits noted    Coordination  Opposition Impaired bilaterally   Fine Motor Coordination essential tremor    Sensory Assessment  Sensory Tests Results No sensory impairment noted    Skin Assessment  Skin Assessment See Nursing Documentation    Balance  Sitting Balance: Static  FAIR       Maintains static position without assist or device, may require Supervision or Verbal Cues (<2 minutes)   Sitting Balance: Dynamic  FAIR-     Performs dynamic activities through partial range (50-75%) with Contact Guard   Standing Balance: Static POOR+    Minimal assist to maintain static position with no Assistive Device   Standing Balance: Dynamic  POOR+   Moves through 1/2 range with minimal assist to right self   Balance Assist Device Rolling walker    ADL:  Upper Body Bathing  Independence/Assistance Level Minimum assist   Independence limited by Weakness;Impaired balance;Requires assist x1 person   Activity Analysis Difficulty completing bathing tasks in standing    ADL: Lower Body Bathing  Independence/Assistance Level Moderate assist   Independence limited by Weakness;Impaired balance;Requires assist x1 person   Activity Analysis Demonstrates impaired balance with standing components    ADL: Lower Body Dressing  Independence/Assistance Level Maximum assist   Assistive Device --   will introduce hip kit  Clothing Items hospital socks   Activity Analysis Demonstrates difficulty donning/doffing due to post-surgical precautions    ADL: Toilet Training  Independence/Assistance Level Minimum assist;Verbal cues   Assistive  Device Raised toilet seat;Grab bar(s)   Hygiene Performance moderate assist   Independence limited by Impaired balance;Weakness   Activity Analysis Able to ambulate to bathroom for toileting needs    ADL: Grooming  Independence/Assistance Level Supervision;Set-up required   Grooming Activities wash face;wash hands   sitting  Activity Analysis Unable to complete in standing     AM-PAC - Daily Activity IP Short Form  Help needed from another person putting on/taking off regular lower body clothing 2 - A Lot   Help needed from another person for bathing (incl. washing, rinsing, drying) 2 - A Lot   Help needed from another person for toileting (incl. using toilet, bedpan, urinal) 2 - A Lot   Help needed from another person putting on/taking off regular upper body clothing 3 - A Little   Help needed from another person taking care of personal grooming such as brushing teeth 2 - A Lot   Help needed from another person eating meals 3 - A Little   AM-PAC Daily Activity Raw Score (Total of rows above) 14   CMS Score (based on Raw Score - with G Code) 14 - 59.67% impaired      (G Code - CK)    Sit-Stand Transfer Training  Sit-to-Stand Transfer Independence/Assistance Level Minimum assist;Verbal cues   Sit-to-Stand Transfer Assist Device Rolling walker   Stand-to-Sit Transfer Independence/Assistance Level Minimum assist   Stand-to-Sit Transfer Assist Device Rolling walker   Occupation-Based Indications: Sit to Stand Transfers To perform grooming tasks standing;To complete toilet hygiene in standing   Limitations to Sit to Stand Transfers for Occupation-Based Tasks Weakness;Balance deficits;Impaired activity tolerance;Impaired safety awareness    Functional Ambulation  Symptoms Noted During/After Treatment  fatigue;increased pain   Independence/Assistance Level  Minimum assist;Assist of 1   Assistive Device  Rolling walker   Gait Distance 15 feet   Ambulation distance was limited by: pain   Occupation-based Indications:  Functional Ambulation In preparation for mobility to bathroom for toileting tasks;To improve activity tolerance for daily routines;To improve balance and increase safety with daily routines   Limitations to functional ambulation for occupation-based tasks Weakness;Impaired activity tolerance    Handoff Documentation  Handoff Patient in chair;Chair alarm;Pressure relief cushion;Patient instructed to call nursing for mobility;Discussed with nursing    Therapeutic Exercise  Therapeutic Exercise Comments ankle pumps    Therapeutic Functional Activity  Interventions to support occupations Engaged in functional transfers in preparation for occupation-based tasks;Engaged in functional transfers in preparation for ADL tasks;Engaged in balance tasks to improve ability to complete ADL tasks;Engaged in balance tasks in preparation for occupation-based tasks   Therapeutic Functional Activity Comments ed on role of ot ot poc adls mob    Modality Treatments  Modality Treatments Comments ice hip    Clinical Impression  Initial Assessment tol ot rx well demso a x 1 for all mob adls will need mod complexity therapy to regain indpend /safety for adls mob   Rehab Potential good, to achieve stated therapy goals    Patient/Family Stated Goals  Patient/Family Stated Goal(s) get stronger    Frequency/Equipment Recommendations  OT Frequency 5x per week   Next Treatment Expected 08/01/23   OT/OTA completing this assessment tk   Equipment Needs During Admission/Treatment Rolling walker    OT Recommendations for Inpatient Admission  ADL Recommendations assist of 1;assist to bathroom;with rolling walker    Planned Treatment / Interventions  Education Treatment / Interventions Patient Education / Training   ed on role of ot ot poc  adls mob   OT Discharge Summary  Disposition Recommendation Moderate complexity support and therapy to progress functional mobility/ ADLs/ IADLs recommended for post- acute care.  See assessment for additional details.   Equipment Recommendations for Discharge To be determined pending progress     Problem: Occupational Therapy GoalsGoal: Occupational Therapy GoalsDescription: OT GOALS;1. AMB IDNPEND W/ RW TO/FROM BR 2. INDPEND STAND AT SINK TO GROOM W/ RW3. INDPEND W/ LB WASH & DRESS AE PRN4. INDPEND ALL AREAS OF TOILETING DME PRNOutcome: Interventions implemented as appropriate Verneita Griffes,  OTR/L 07/31/2023

## 2023-08-01 DIAGNOSIS — Z602 Problems related to living alone: Secondary | ICD-10-CM

## 2023-08-01 DIAGNOSIS — Z7982 Long term (current) use of aspirin: Secondary | ICD-10-CM

## 2023-08-01 DIAGNOSIS — F17210 Nicotine dependence, cigarettes, uncomplicated: Secondary | ICD-10-CM

## 2023-08-01 DIAGNOSIS — Z884 Allergy status to anesthetic agent status: Secondary | ICD-10-CM

## 2023-08-01 DIAGNOSIS — D62 Acute posthemorrhagic anemia: Secondary | ICD-10-CM

## 2023-08-01 DIAGNOSIS — Z981 Arthrodesis status: Secondary | ICD-10-CM

## 2023-08-01 DIAGNOSIS — Z96653 Presence of artificial knee joint, bilateral: Secondary | ICD-10-CM

## 2023-08-01 DIAGNOSIS — R7989 Other specified abnormal findings of blood chemistry: Secondary | ICD-10-CM

## 2023-08-01 DIAGNOSIS — F329 Major depressive disorder, single episode, unspecified: Secondary | ICD-10-CM

## 2023-08-01 DIAGNOSIS — D509 Iron deficiency anemia, unspecified: Secondary | ICD-10-CM

## 2023-08-01 DIAGNOSIS — M2170 Unequal limb length (acquired), unspecified site: Secondary | ICD-10-CM

## 2023-08-01 DIAGNOSIS — Z86010 Personal history of colonic polyps: Secondary | ICD-10-CM

## 2023-08-01 DIAGNOSIS — E785 Hyperlipidemia, unspecified: Secondary | ICD-10-CM

## 2023-08-01 DIAGNOSIS — D696 Thrombocytopenia, unspecified: Secondary | ICD-10-CM

## 2023-08-01 DIAGNOSIS — Z886 Allergy status to analgesic agent status: Secondary | ICD-10-CM

## 2023-08-01 DIAGNOSIS — M1611 Unilateral primary osteoarthritis, right hip: Secondary | ICD-10-CM

## 2023-08-01 DIAGNOSIS — Z96642 Presence of left artificial hip joint: Secondary | ICD-10-CM

## 2023-08-01 DIAGNOSIS — Z79899 Other long term (current) drug therapy: Secondary | ICD-10-CM

## 2023-08-01 DIAGNOSIS — Z9049 Acquired absence of other specified parts of digestive tract: Secondary | ICD-10-CM

## 2023-08-01 DIAGNOSIS — I10 Essential (primary) hypertension: Secondary | ICD-10-CM

## 2023-08-01 DIAGNOSIS — Z90710 Acquired absence of both cervix and uterus: Secondary | ICD-10-CM

## 2023-08-01 DIAGNOSIS — K5900 Constipation, unspecified: Secondary | ICD-10-CM

## 2023-08-01 DIAGNOSIS — S72031A Displaced midcervical fracture of right femur, initial encounter for closed fracture: Secondary | ICD-10-CM

## 2023-08-01 LAB — BASIC METABOLIC PANEL
BKR ANION GAP: 10 (ref 7–17)
BKR BLOOD UREA NITROGEN: 15 mg/dL (ref 8–23)
BKR BUN / CREAT RATIO: 18.8 (ref 8.0–23.0)
BKR CALCIUM: 8.3 mg/dL — ABNORMAL LOW (ref 8.8–10.2)
BKR CHLORIDE: 105 mmol/L (ref 98–107)
BKR CO2: 23 mmol/L (ref 20–30)
BKR CREATININE: 0.8 mg/dL (ref 0.40–1.30)
BKR EGFR, CREATININE (CKD-EPI 2021): >60 mL/min/{1.73_m2} (ref >=60)
BKR GLUCOSE: 97 mg/dL (ref 70–100)
BKR POTASSIUM: 4.1 mmol/L (ref 3.3–5.3)
BKR SODIUM: 138 mmol/L (ref 136–144)

## 2023-08-01 LAB — MAGNESIUM: BKR MAGNESIUM: 1.8 mg/dL (ref 1.7–2.4)

## 2023-08-01 LAB — IMMATURE PLATELET FRACTION (BH GH LMW YH)
BKR WAM IPF, ABSOLUTE: 11.1 x1000/ÂµL (ref <20.0)
BKR WAM IPF: 11.8 % — ABNORMAL HIGH (ref 1.2–8.6)

## 2023-08-01 LAB — CBC WITHOUT DIFFERENTIAL
BKR WAM ANC (ABSOLUTE NEUTROPHIL COUNT): 4.73 x 1000/ÂµL (ref 2.00–7.60)
BKR WAM HEMATOCRIT (2 DEC): 26.8 % — ABNORMAL LOW (ref 35.00–45.00)
BKR WAM HEMOGLOBIN: 8.7 g/dL — ABNORMAL LOW (ref 11.7–15.5)
BKR WAM MCH (PG): 31.3 pg (ref 27.0–33.0)
BKR WAM MCHC: 32.5 g/dL (ref 31.0–36.0)
BKR WAM MCV: 96.4 fL (ref 80.0–100.0)
BKR WAM MPV: 12.2 fL — ABNORMAL HIGH (ref 8.0–12.0)
BKR WAM PLATELETS: 94 x1000/ÂµL — ABNORMAL LOW (ref 150–420)
BKR WAM RDW-CV: 14.9 % (ref 11.0–15.0)
BKR WAM RED BLOOD CELL COUNT.: 2.78 M/ÂµL — ABNORMAL LOW (ref 4.00–6.00)
BKR WAM WHITE BLOOD CELL COUNT: 7 x1000/ÂµL (ref 4.0–11.0)

## 2023-08-01 MED ORDER — CHOLECALCIFEROL (VITAMIN D3) 10 MCG (400 UNIT) TABLET
10 | ORAL_TABLET | Freq: Every day | ORAL | 3 refills | Status: AC
Start: 2023-08-01 — End: ?

## 2023-08-01 MED ORDER — POLYETHYLENE GLYCOL 3350 17 GRAM ORAL POWDER PACKET
17 | Freq: Every day | ORAL | 3 refills | Status: AC
Start: 2023-08-01 — End: ?

## 2023-08-01 MED ORDER — MORPHINE 15 MG IMMEDIATE RELEASE TABLET
15 | ORAL_TABLET | Freq: Four times a day (QID) | ORAL | 1 refills | Status: AC | PRN
Start: 2023-08-01 — End: ?

## 2023-08-01 MED ORDER — SENNOSIDES 8.6 MG TABLET
8.6 | ORAL_TABLET | Freq: Two times a day (BID) | ORAL | 1 refills | Status: AC
Start: 2023-08-01 — End: ?

## 2023-08-01 MED ORDER — BISACODYL 10 MG RECTAL SUPPOSITORY
10 mg | Freq: Every day | RECTAL | Status: DC | PRN
Start: 2023-08-01 — End: 2023-08-01

## 2023-08-01 MED ORDER — MAGNESIUM OXIDE 400 MG (241.3 MG MAGNESIUM) TABLET
400 | ORAL | Status: CP
Start: 2023-08-01 — End: ?
  Administered 2023-08-01 (×2): 400 mg (241.3 mg magnesium) via ORAL

## 2023-08-01 MED ORDER — ONDANSETRON 4 MG DISINTEGRATING TABLET
4 | ORAL_TABLET | Freq: Three times a day (TID) | 1 refills | Status: AC | PRN
Start: 2023-08-01 — End: ?

## 2023-08-01 MED ORDER — BISACODYL 5 MG TABLET,DELAYED RELEASE
5 mg | Freq: Every day | ORAL | Status: DC | PRN
Start: 2023-08-01 — End: 2023-08-01

## 2023-08-01 MED ORDER — CHOLECALCIFEROL (VITAMIN D3) 10 MCG (400 UNIT) TABLET
10400 mcg (400 unit) | Freq: Every day | ORAL | Status: DC
Start: 2023-08-01 — End: 2023-08-01

## 2023-08-01 MED ORDER — ACETAMINOPHEN 325 MG TABLET
325 | ORAL_TABLET | Freq: Four times a day (QID) | ORAL | 1 refills | Status: AC
Start: 2023-08-01 — End: ?

## 2023-08-01 MED ORDER — BISACODYL 10 MG RECTAL SUPPOSITORY
10 | Freq: Every day | RECTAL | Status: AC | PRN
Start: 2023-08-01 — End: ?

## 2023-08-01 MED ORDER — APIXABAN 2.5 MG TABLET
2.5 | Freq: Two times a day (BID) | ORAL | Status: AC
Start: 2023-08-01 — End: ?

## 2023-08-01 MED ORDER — IBUPROFEN 600 MG TABLET
600 mg | Freq: Once | ORAL | Status: DC
Start: 2023-08-01 — End: 2023-08-01

## 2023-08-01 NOTE — Discharge Instructions
 Total Joint Discharge Instructions  Your Surgeon was: Dr. Donnie Coffin, Milinda Pointer, MDYour Procedure was: Right total hip replacementDate of Surgery: 9/23/2024Follow-Up Appointment: 08/13/23 @ 1:20pmGeneral Instructions: -Please call if there are any questions or concerns. -Call if you develop a fever, worsening pain, redness, numbness, weakness, coolness, bleeding, or swelling to surgical site or extremity. -Do not hesitate to go to the Emergency Department if you are concerned.Diet: You are able to resume your normal cardiac diet.  Activity: -You are to remain weight-bearing as tolerated to the right lower extremity. -Ambulate with assistive devices as needed.  -Continue to follow all precautions as instructed by physical therapy.  -We recommend that you are out of bed for all meals and participate in daily physical therapy with ambulation program.  Wound Care: Keep original occlusive cover in place until 7 days post-op, then remove and leave incision open to air or gently wrap with dry gauze and ACE bandage.  If you have sutures or staples to be removed, those can be removed by the home health service or your surgeon at 14 days after surgery.  You may shower with the occlusive dressing and allow water to run over the dressing, however, please do not submerge incision in a bathtub, pool, or hot tub.  Do not put any cream, lotion or ointment on incision(s) until you rpost-op visit at 6 weeks. Blood Clot Prevention: Please continue Eliquis 2.5mg  twice daily for a total of 35 days post-operatively to prevent blood clots. Pain Management: -You may be prescribed a pain medication such as oxycodone, dilaudid, or tramadol as needed for pain.-Tylenol is taken four times a day to reduce the need for narcotic pain medicines.  Do not exceed 4,000mg  per day.Bowel Management: - Begin the prescription of Senna as directed to prevent constipation- Monitor for positive + flatus as daily indicator for return of bowel function- If no bowel movement for 3 days, take over-the-counter MiraLax 1 capful by mouth daily with hydration- If no bowel movement for 5 days, take over-the-counter move milk of magnesia and/or fleets enema- If no bowel movement for 7 days or having abdominal pain, proceed to the emergency department for x-ray- If you develop diarrhea, stop taking stool softeners and laxativesInflammation Management: -You may be prescribed Celebrex and Decadron which will decrease inflammation, improving your recovery. NARCOTIC SAFETY INSTRUCTIONS You are being prescribed narcotic pain medication as part of your discharge regimen. Please exercise caution when using these medications and only use as directed by your physician. Overdose of narcotics can be fatal. Signs of narcotic overdose include:- Decreased consciousness- Pinpoint pupils- Slowed breathing ratePlease instruct your friends and family to be aware of these warning signs. If you believe you or someone you know has taken too many pills, please contact a physician immediately or call 911. Narcotic pain medication can also cause constipation. Please take a stool softener while you are taking narcotics and remember to keep yourself well hydrated.  NARCOTIC DISPOSAL INSTRUCTIONSHaving excess narcotic pain medication at home can be a safety issue for you and those around you. We encourage you to dispose of any additional narcotic medications after your surgical pain subsides, which typically occurs 3-5 days after your operation. Narcotic pain medicine should never be saved for future use. To properly dispose of excess narcotics at home you can simply flush the medication down the toilet. Alternatively, a more environmentally friendly option is to drop off your additional narcotic medication at a participating local police department. In [facility], you may drop off your  medications at the [facility] Department located at [address], [place], [place] [address]. For the address of the participating police station nearest you, please visit: [email or web address] Future Appointments: Future Appointments Date Time Provider Department Center 08/13/2023  1:20 PM Vella Redhead, PA [facility]

## 2023-08-01 NOTE — Progress Notes
 Brief Orthopedic PA-C Progress Note: Patient was examined sitting up comfortably in recliner watching TV, waiting for breakfast. Pain is currently well controlled and better than it was yesterday I am so confused, I was just told I'm being discharged this morning. I had no idea this was happening. We discussed that insurance authorization was approved for her to go to [facility], which she is agreeable to, she just feels rushed this morning. She is still pending a bowel movement, agreeable to suppository after breakfast. 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 Served some coffee to her while she's awaiting her breakfast. Tolerating diet well. Voiding spontaneously. Passing flatus. No abd distention or tenderness. Denies calf tenderness, N/V, CP, SOB, headache, dizziness.All questions answered to patient's satisfaction. Vital Signs:Vitals:  08/01/23 0834 BP: 98/64 Pulse: 73 Resp: 18 Temp: 97.8 ?F (36.6 ?C) Physical Exam:NAD, alert, oriented, responding to questions appropriatelyRespirations unlabored on RAAbdomen soft, non-tender RLEAnterior hip Mepilex dressing c/d/IThigh compartment is soft and nontenderMinimal ecchymosis + tibialis anterior, gastroc, EHL/FHL bilaterally SILT to distal LE bilaterallyCalves soft, nontender to palpation, compressible bilaterallyDP/PT pulses present bilaterallyToes wwp bilaterallySCDs in place bilaterally Labs: Lab Results Component Value Date  RBC 2.78 (L) 08/01/2023  HGB 8.7 (L) 08/01/2023  HCT 26.80 (L) 08/01/2023  PLT 94 (L) 08/01/2023  NEUTROPHILS 81.4 (H) 07/29/2023 Lab Results Component Value Date  NA 138 08/01/2023  K 4.1 08/01/2023  CREATININE 0.80 08/01/2023  BUN 15 08/01/2023  CO2 23 08/01/2023  MG 1.8 08/01/2023 Estimated Creatinine Clearance: 38 mL/min (by C-G formula based on SCr of 0.8 mg/dL).2 Days Post-OpRIGHT TOTAL HIP ARTHROPLASTYA/P: Holly Erickson is a 82 y.o. female who is POD#2 s/p right total hip arthroplasty with Dr. Donnie Coffin. Patient tolerated procedure well and is currently hemodynamically stable on floor. Discharge to short term rehab pending bowel movement.  Plan: - WBAT to RLE, Out of bed with PT/OT- VTE Prophylaxis: Eliquis 2.5mg  BID x 35 days, SCDs, OOBA- Continue home medications as appropriate - Pain control- Diet: as tolerated- Bowel regimen increased- Last BM before surgery- Dressing: Mepilex dressing to remain in place until POD7- Incentive spirometry- Will observe acute blood loss anemia and monitor vital signs, no indication for pRBC transfusion at this time - Dispo planning: STR pending BM; auth obtainedElectronically Signed by Ranell Patrick, PA-C, September 25, 2024[facility] Fracture Service can be contacted on [facility] Fract Serv Prov Dynamic Role at [phone] Discharge Date: 08/01/23 \

## 2023-08-01 NOTE — Plan of Care
 Problem: Adult Inpatient Plan of CareGoal: Plan of Care ReviewOutcome: Interventions implemented as appropriateGoal: Patient-Specific Goal (Individualized)Outcome: Interventions implemented as appropriateGoal: Absence of Hospital-Acquired Illness or InjuryOutcome: Interventions implemented as appropriateGoal: Optimal Comfort and WellbeingOutcome: Interventions implemented as appropriateGoal: Readiness for Transition of CareOutcome: Interventions implemented as appropriate Problem: Fall Injury RiskGoal: Absence of Fall and Fall-Related InjuryOutcome: Interventions implemented as appropriate Problem: Wound Healing ProgressionGoal: Optimal Wound HealingOutcome: Interventions implemented as appropriate Plan of Care Overview/ Patient Status    Assumed care of patient at 0700, alert & orientated x 3, confused to situation, room air, IV intact, assist x 1 RW, bilateral compressions in place, pills given whole, LBM 08/01/23, morphine given for pain with good effect, son at bedside, no SOB, no Chest pain, no pain, bed in lowest position, call bell within reach, hourly rounds done. 1200- Discharged patient, IV removed, belongings gathered, gave report to [place] at [facility] was discharged via Ambulance accompanied by Alone.  Verbalized understanding of discharge instructionsand recommended follow up care as per the after visit summary.  Written discharge instructions provided. Denies any further questions. Vital signs    Vitals:  07/31/23 1634 07/31/23 2018 07/31/23 2346 08/01/23 0834 BP:  (!) 102/58 97/60 98/64  Pulse:  71 (!) 56 73 Resp:  18 18 18  Temp: 97.3 ?F (36.3 ?C) 97.9 ?F (36.6 ?C) 97.4 ?F (36.3 ?C) 97.8 ?F (36.6 ?C) TempSrc: Oral Oral Oral Oral SpO2:  94% 95% 95% Weight:     Height:     Patient confirmed all belongings returned. Belongings charted in last 7 days: Patient Valuables   Patient Valuables Flowsheet                    PATIENT VALUABLE(S)       Cell phone disposition At bedside/locker/closet 07/30/23 1603  Denture Disposition At bedside/locker/closet 07/30/23 1603   Other disposition At bedside/locker/closet  books 07/29/23 1911  Vision Disposition At bedside/locker/closet 07/30/23 1603          [phone]

## 2023-08-01 NOTE — Plan of Care
 Plan of Care Overview/ Patient Status    Patient is A&Ox4/forgetful.LBM 9/21.Scheduled miralax and senna given.Up to toilet with assist, uses RW.Tolerated ambulation well.Voiding.RLE +DP+Dorsiflexion/plantar flexion, warm, no discoloration.Right hip hydrocolloid dressing is clean, dry, intact.Pain managed well with po morphine 15mg  prn.Patient is resting comfortably at this time.SCD's in place.Will continue to assess.

## 2023-08-01 NOTE — Care Coordination-Inpatient
 INSURANCE AUTH OBTAINED FOR STR 07/31/23 1623 Authorization Information Date Authorization Initiated:  07/31/23 Time Authorization Initiated: 1623 Mode Clinical was sent: Portal Facility Name [facility]	[place] [facility] [employer] Authorization yes Insurance Company [facility] Medicare Insurance Co. Contact Name/# UHC/Home and [facility] Authorization #/Details Auth #: [id number], Home and Community Care #; [phone], for admission to [facility] 9/25-9/27/24; Update due on 08/03/23 Date Authorization recieved 08/01/23 Time Authorization recieved 0735 Follow up contact necessary Yes Contact Name UHC/Home and Community Care Contact phone: [phone] Insurance Co. Fax number: [phone]

## 2023-08-01 NOTE — ED Provider Notes
 Chief Complaint Patient presents with  Fall   mechanical fall at 3am, possibly got dizzy. No head strike, no LOC, no thinners. Reporting R hip pain, no shortening or rotation. A+OX4. MDM 82 yo F w hx of depression, hld, coming in w mechanical fall around 3 AM. Not able to get up on her own and was found around 7 am. Complaining of right hip pain and denies head strike or loc. Patient unclear as to why she fell but does recall feeling possible lightheaded prior to the fall. Received the covid vaccine and flu shot on Friday.Ddx hip fracture, syncope, considered dehydrationPatient with significant pain and internal and external rotation of her right hip.  Her distal CMS is intact.Left lower extremity shows bruising over the shin but no significant tendernessNo midline spinal tendernessAlert and oriented x4Dry oral mucosaNo chest wall tendernessFor most likely some sort of right hip injury.  Will get x-rays and [place] of the right hipOrthopedic team made aware for right hip fracture.  The anesthesia block service came for patient to perform femoral nerve blockNote was made over a possible aortic aneurysm in the patient's past.  Will get CTA and echo to evaluate for aneurysm prior to surgeryAdmitted to OrthopedicsStaffed with Dr. Demetrius Revel Physical ExamED Triage Vitals [07/29/23 0726]BP: (!) 156/81Pulse: 79Pulse from  O2 sat: n/aResp: 18Temp: 97.7 ?F (36.5 ?C)Temp src: OralSpO2: 95 % BP 98/64  - Pulse 73  - Temp 97.8 ?F (36.6 ?C) (Oral)  - Resp 18  - Ht 5' (1.524 m)  - Wt 50.2 kg  - SpO2 95%  - BMI 21.61 kg/m? Physical Exam ProceduresAttestation/Critical CarePatient Reevaluation: ED Attestation: PA/APRNFace to face evaluation was performed by me in collaboration with the Advanced Practice Provider to assess for significant health threats. I provided a substantive portion of the care of this patient.  I personally performed medically appropriate history and physical exam and the MDM: On my exam: Elderly female, speaking in full sentences, right hip shortened, rotatedMy differential includes: Right hip fracture vs ICH; I reviewed imaging, notable for right hip fracture. Discussed with orthopedics, patient admitted to hip fracture service. Additional acute and/or chronic problems addressed:Micki Cassel CoupetCritical care provided by attending: critical careCritical care time (minutes): 40.Critical care time was exclusive of separately billable procedures and teaching time.Critical care was necessary to treat or prevent imminent or life-threatening deterioration of the following conditions:  Musculoskeletal.Critical care was time spent personally by me on the following activities: blood draw for specimens, discussions with consultants, examination of patient, evaluation of patient's response to treatment, ordering and review of radiographic studies, ordering and performing treatments and interventions and re-evaluation of patient's condition.Patient progress: stableSepsis DocumentationConcern for sepsis clinical findings do not suggest sepsis, severe sepsis, or septic shockStroke DocumentationStroke/TIA: NoClinical Impressions as of 08/01/23 1205 Closed fracture of hip, unspecified laterality, initial encounter (HC Code) (HC CODE) (HC Code)  ED DispositionAdmitSign-out received, patient admitted with no immediate intervention required. Will monitor for changes in condition.Jeris Penta MD, MPH Mickel Baas, MD09/22/24 1235 Rolla Flatten, MD09/22/24 1629 Sherron Monday, PA09/23/24 7846 Mickel Baas, MD09/25/24 [phone]

## 2023-08-01 NOTE — Discharge Summary
 [facility]SrcMed/Surg Discharge SummaryPatient Data:  Patient Name: Holly Erickson Admit date: 07/29/2023 Age: 82 y.o. Discharge date: 08/01/23 DOB: 07/16/1941	 Discharge Attending Physician: Arlana Lindau, MD  MRN: [id number]	 Discharged Condition: stable PCP: Morley Kos, MD  Disposition: Skilled Nursing Facility for Short Term Rehab Principal Diagnosis: Right femoral neck fractureComorbidities Comorbidities present on admission:Thrombocytopenia noted on admission. Past Medical History: Diagnosis Date  Arrhythmia   Colon polyp   Constipation   Depression   Hyperlipidemia  Secondary diagnoses occurring during hospitalization:HypocalcemiaThrombocytopenia  Post Discharge Follow Up Items: Issues to be Addressed Post Discharge:WBAT RLEEliquis 2.5mg  BID x 35 daysFollow up with Dr. Renelda Loma office as scheduledFollow-up Information:[facility] [id number] [id number] at [address] [place] [phone] on 10/7/2024Meghan Maye Hides op Check, 1:20pm Future Appointments Date Time Provider Department Center 08/13/2023  1:20 PM Vella Redhead, PA [facility] Course: The patient is a 82 y.o. female who was admitted to [facility] on 07/29/23 after suffering a right femoral neck fracture from a mechanical fall.  After medical risk stratification and preop optimization, patient was taken to the OR for a right total hip arthroplasty with Dr. Donnie Coffin on 07/30/23. Patient tolerated procedure well and was transferred to floor in hemodynamically stable condition. Patient has been working with PT/OT, who recommended short term rehab upon discharge. Any acute events during hospital course listed below. At time of discharge, patient is medically and surgically stable.Surgeon: Dr. Donnie Coffin, Milinda Pointer, MDDate of Surgery: 9/23/2024Intra-Op Complications: None notedAcute events in the during the hospital stay:NoneConsults:  (Consulting physician, problem, recs, f/u info)Medicine consulted for pre-op risk stratification and co-managementBladder Function Concerns:	None, voidingDVT Prophylaxis:  (Medication, duration, lab monitoring)	Eliquis 2.5mg  BID x35 days post op, SCD's, early ambulationHome Medication Changes:  (with explanation) See med recWeight Bearing Status: WBAT to right lower extremity Patient Code Status: Full code Discharge Disposition Documentation: Short term rehab Discharge Exclusion for [facility] Documentation:The patient was not discharged to home for medical reasons.  Data: Pertinent lab findings:Recent Labs Lab 09/23/240732 09/24/240546 09/25/240518 WBC 9.1 6.5 7.0 HGB 9.7* 8.1* 8.7* HCT 30.00* 24.70* 26.80* PLT 101* 88* 94*  Recent Labs Lab 09/22/240845 NEUTROPHILS 81.4*  Recent Labs Lab 09/23/240732 09/24/240546 09/25/240518 NA 138 141 138 K 4.3 3.8 4.1 CL 109* 109* 105 CO2 22 23 23  BUN 17 18 15  CREATININE 0.80 0.80 0.80 GLU 121* 99 97 ANIONGAP 7 9 10   Recent Labs Lab 09/23/240732 09/24/240546 09/25/240518 CALCIUM 8.4* 7.8* 8.3* MG 1.9  --  1.8  Recent Labs Lab 09/23/240732 ALT 31 AST 47* ALKPHOS 63 BILITOT 0.3 BILIDIR <0.2  Recent Labs Lab 09/22/241108 PTT 32.9* LABPROT 12.5* INR 1.18*  Microbiology:No results for input(s): LABBLOO, LABURIN, LOWERRESPIRA in the last 168 hours.Imaging: Imaging results last 1 week:  CTA Chest Abdomen aortic dissection (entire aorta visualized)Result Date: 9/22/2024No aortic aneurysm. [facility] Radiology Notify System Classification: Routine. Reported and signed by: Lupita Leash, MD  [facility] Radiology and Biomedical Imaging XR Chest PA or APResult Date: 07/29/2023 Normal preoperative chest [facility] Radiology Notify System Classification: Routine. Reported and signed by: Rexford Maus, MD  [facility] Radiology and Biomedical Imaging [place] Pelvis wo IV ContrastResult Date: 9/22/2024Subcapital right femoral fracture. [place] [facility] Notify System Classification: Routine. Report initiated by:  Clair Gulling, MD Reported and signed by: Rosey Bath, MD  [facility] Radiology and Biomedical Imaging Lower Leg 2V LeftResult Date: 9/22/2024Subcapital right femoral fracture. [place] Radiology Notify System Classification: Routine. (accession [id number]), Routine. (accession [id number]) Report initiated by:  Eudelia Bunch  Judi Cong, MD Reported and signed by: Rosey Bath, MD  [facility] Radiology and Biomedical Imaging XR Hip RightResult Date: 9/22/2024Subcapital right femoral fracture. [place] Radiology Notify System Classification: Routine. (accession [id number]), Routine. (accession [id number]) Report initiated by:  Dorina Hoyer, MD Reported and signed by: Rosey Bath, MD  [facility] Radiology and Biomedical Imaging [place] Head wo IV ContrastResult Date: 9/22/2024No [place] evidence for acute intracranial pathology. [place] Radiology Notify System Classification: Routine. Reported and signed by: Rosey Bath, MD  [facility] Radiology and Biomedical Imaging  Diet:  Diet RegularNutrition SupplementsMobility: Highest Level of mobility - ACTUAL: Mobility Level 6, Walk 10+ steps, AM PAC 18-21Physical Therapy Disposition Recommendation: Moderate complexity Physical Exam Discharge vital signs: Vitals:  08/01/23 0834 BP: 98/64 Pulse: 73 Resp: 18 Temp: 97.8 ?F (36.6 ?C) Cognitive Status at Discharge: Baseline Alert and Oriented x 3Physical Exam NAD, alert, oriented, responding to questions appropriatelyRespirations unlabored on RAAbdomen soft, non-tender RLEAnterior hip Mepilex dressing c/d/IThigh compartment is soft and nontenderMinimal ecchymosis + tibialis anterior, gastroc, EHL/FHL bilaterally SILT to distal LE bilaterallyCalves soft, nontender to palpation, compressible bilaterallyDP/PT pulses present bilaterallyToes wwp bilaterallySCDs in place bilaterally History  Allergies Allergies Allergen Reactions  Sodium Pentothal [Thiopental Sodium]    Childhood  Duragesic [Fentanyl] Itching and Dizziness   PATCH ONLY.. Oral form is ok  PMH PSH Past Medical History: Diagnosis Date  Arrhythmia   Colon polyp   Constipation   Depression   Hyperlipidemia   Past Surgical History: Procedure Laterality Date  APPENDECTOMY    BACK SURGERY    COLONOSCOPY  03/11/2018  [facility]  HERNIA REPAIR    HYSTERECTOMY    OOPHORECTOMY Bilateral   PARATHYROIDECTOMY    REPLACEMENT TOTAL HIP W/  RESURFACING IMPLANTS Left   REPLACEMENT TOTAL KNEE BILATERAL    RHINOPLASTY    ROTATOR CUFF REPAIR Right   TONSILLECTOMY    Social History Family History Social History Tobacco Use  Smoking status: Every Day  Smokeless tobacco: Not on file Substance Use Topics  Alcohol use: Not Currently  Family History Problem Relation Age of Onset  Coronary Artery Disease Mother   Ulcers Father   Coronary Artery Disease Father     Discharge Medications  Discharge: Current Discharge Medication List  START taking these medications  Details acetaminophen (TYLENOL) 325 mg tablet Take 2 tablets (650 mg total) by mouth every 6 (six) hours for 10 days.Qty: 80 tablet, Refills: 0Start date: 08/01/2023, End date: 08/11/2023  apixaban (ELIQUIS) 2.5 mg tablet Take 1 tablet (2.5 mg total) by mouth every 12 (twelve) hours.Start date: 07/31/2023, End date: 09/04/2023  bisacodyL (DULCOLAX) 10 mg suppository Place 1 suppository (10 mg total) rectally daily as needed for constipation.Qty: 12 suppositoryStart date: 08/01/2023 cholecalciferol, vitamin D3, 10 mcg (400 unit) tablet Take 2 tablets (800 Units total) by mouth daily.Qty: 30 tablet, Refills: 2Start date: 08/02/2023  morphine (MSIR) 15 mg tablet Take 1 tablet (15 mg total) by mouth every 6 (six) hours as needed for pain.Qty: 20 tablet, Refills: 0Start date: 08/01/2023  ondansetron (ZOFRAN-ODT) 4 mg disintegrating tablet Place 1 tablet (4 mg total) onto the tongue every 8 (eight) hours as needed for nausea for up to 5 days.Qty: 15 tablet, Refills: 0Start date: 08/01/2023, End date: 08/06/2023  polyethylene glycol (MIRALAX) 17 gram packet Take 1 packet (17 g total) by mouth daily. Mix in 8 ounces of water, juice, soda, coffee or tea prior to taking.Qty: 14 each, Refills: 2Start date: 08/01/2023  senna (SENOKOT) 8.6 mg tablet Take 2 tablets (17.2 mg total)  by mouth 2 (two) times daily.Qty: 56 tablet, Refills: 0Start date: 08/01/2023, End date: 08/15/2023   CONTINUE these medications which have NOT CHANGED  Details ascorbic acid (VITAMIN C ORAL) Take by mouth daily.  aspirin 81 mg EC delayed release tablet Take 1 tablet (81 mg total) by mouth daily.  buPROPion SR (WELLBUTRIN SR) 100 mg 12 hr tablet 1 tab(s) orally once a day for 30 days  camphor-menthoL (SARNA) 0.5-0.5 % lotion Apply topically as needed for itching. Keep in fridge.Qty: 222 mL, Refills: 2  Associated Diagnoses: Pruritus  DULoxetine (CYMBALTA) 60 MG capsule Take 1 capsule (60 mg total) by mouth daily.  !! gabapentin (NEURONTIN) 100 mg capsule Take 1 capsule (100 mg total) by mouth 2 (two) times daily with breakfast and dinner.  !! gabapentin (NEURONTIN) 300 mg capsule Take 1 capsule (300 mg total) by mouth.  melatonin 5 mg tablet Take 1 tablet (5 mg total) by mouth nightly.  rosuvastatin (CRESTOR) 40 mg tablet Take 1 tablet (40 mg total) by mouth daily.  zinc gluconate 50 mg tablet Take 1 tablet (50 mg total) by mouth daily. enzymes,digestive (DIGESTIVE ENZYMES ORAL) Take by mouth daily.   !! - Potential duplicate medications found. Please discuss with provider.  STOP taking these medications   multivitamin capsule      25 minutes spent on the discharge of this patientElectronically Signed:Electronically Signed by Ranell Patrick, PA-C, September 25, 2024[facility] Fracture Service can be contacted on [facility] Fract Serv Prov Dynamic Role at [phone]

## 2023-08-01 NOTE — Plan of Care
 Inpatient Physical Therapy Progress Note IP Adult PT Eval/Treat - 08/01/23 1145    Date of Visit / Treatment  Date of Visit / Treatment 08/01/23   Note Type Daily Note   Progress Report Due 08/10/23   Start Time 1130   End Time 1145   Total Treatment Time 15    General Information  Subjective Pt alert and agreeable to therex; declining ambulation   General Observations Pt received resting in bedside recliner, on room air, NAD   Precautions/Limitations Fall Precautions;Right hip precautions;Anterior hip   Precautions/Limitations Comment WBAT RLE    Weight Bearing Status  Weight Bearing Status Comments WBAT RLE    Vital Signs and Orthostatic Vital Signs  Vital Signs Free text On room air, NAD    Pain/Comfort  Pain Location - Side Right   Pain Location hip   Pain Comment (Pre/Post Treatment Pain) Pt with min to mod pain in R hip with mobility, premedicated, RN aware    Patient Coping  Observed Emotional State accepting   Verbalized Emotional State acceptance    Cognition  Overall Cognitive Status Within Functional Limits    Skin Assessment  Skin Assessment See Nursing Documentation    Balance  Balance Skills Training Comment Pt remained sitting in the chair    Sit-Stand Transfer Training  Sit-Stand Transfer Comments Pt declining out of chair mobility and ambulation d/t discharge    Handoff Documentation  Handoff Patient in chair;Patient instructed to call nursing for mobility;Discussed with nursing    Activity Tolerance  Activity Tolerance Comments Fair    PT- AM-PAC - Basic Mobility Screen- How much help from another person do you currently need.....  Turning from your back to your side while in a a flat bed without using rails? 3 - A Little - Requires a little help (supervision, minimal assistance). Can use assistive devices.   Moving from lying on your back to sitting on the side of a flat bed without using bed rails? 3 - A Little - Requires a little help (supervision, minimal assistance). Can use assistive devices.   Moving to and from a bed to a chair (including a wheelchair)? 3 - A Little - Requires a little help (supervision, minimal assistance). Can use assistive devices.   Standing up from a chair using your arms(e.g., wheelchair or bedside chair)? 3 - A Little - Requires a little help (supervision, minimal assistance). Can use assistive devices.   To walk in a hospital room? 3 - A Little - Requires a little help (supervision, minimal assistance). Can use assistive devices.   Climbing 3-5 steps with a railing? 1 - Total - Requires total assistance or cannot do it at all.   AMPAC Mobility Score 16   TARGET Highest Level of Mobility Mobility Level 5, Stand for 1 minute   ACTUAL Highest Level of Mobility Mobility Level 3, Sit on edge of bed    Therapeutic Exercise  Therapeutic Exercise Detailed Documentation Lower Extremity Exercises    Lower Extremity Exercises  Sitting Exercises Long arc quads   Supine Exercises Heelslides;Quad sets;Glut sets;Ankle pumps;10x each    Clinical Impression  Follow up Assessment Pt tolerated therapy session well; pt limited by decr strength, balance and activity tolerance. Pt mobilizing with Ax1 and RW. Rec remains for mod complexity support and services upon d/c   Criteria for Skilled Therapeutic Interventions Met yes;treatment indicated   Rehab Potential good, to achieve stated therapy goals    Patient/Family Stated Goals  Patient/Family Stated Goal(s) decrease pain  Frequency/Equipment Recommendations  PT Frequency Daily   Next Treatment Expected 08/02/23   PT/PTA completing this assessment Meg M   Equipment Needs During Admission/Treatment Rolling walker    PT Discharge Summary  Physical Therapy Disposition Recommendation Moderate complexity support and therapy to progress functional mobility/ ADLs/ IADLs recommended for post- acute care.  See assessment for additional details.   Equipment Recommendations for Discharge To be determined pending progress     Cira Rue, PT, DPT

## 2023-08-01 NOTE — Plan of Care
 Plan of Care Overview/ Patient Status    Problem: Adult Inpatient Plan of CareGoal: Readiness for Transition of CareOutcome: Outcome(s) achieved Case Management Plan  Flowsheet Row Most Recent Value Discharge Planning  Patient/Patient Representative goals/treatment preferences for discharge are:  Discharge to STR. Patient/Patient Representative was presented with a list of facilities, agencies and/or dme providers and Referral(s) placed for: Short term rehabilitation (at a nursing facility) Facility/Geographical Preference(s) Area close to pt's home Bed has been secured and is available on: 07/31/23 Facility Name [facility] [place] Mode of Transportation  Ambulance (add comment for special considerations) Transportation scheduled for (date)  08/01/23 Transportation is scheduled as a will call? yes Transportation Company [facility] CM D/C Readiness  PASRR completed and approved Yes Authorization number obtained, if required Yes Is there a 3 day INPATIENT Qualifying stay for Medicare Patients? N/A DME Authorized/Delivered N/A No needs identified/ follow up with PCP/MD N/A Post acute care services secured W10 complete Yes Pri Completed and Accepted  N/A Is the destination address correct on the W10 Yes Finalized Plan  Expected Discharge Date 08/01/23 Discharge Disposition Skilled Nursing Facility  Pt is cleared for dc to [facility] via ambulance.  [facility] has been obtained.  Provider notified of auth as well as facility and [facility] broadcast sent     Nurse to call report and send w10.  TC to obtain IM.   Nursing to call son Tawanna Cooler with ETA of transport.  Spoke with son Tawanna Cooler and he is in agreement with poc.Wynonia Musty     RN BSN ACM-RNCase Management Department [id number] [phone] [facility]

## 2023-08-01 NOTE — Progress Notes
 Hospitalist Medicine Progress NoteAttending Provider: Arlana Lindau, MD [phone]: Chief Complaint: Follow up R fem neck fxInterim History: Mrs. Delzell had no acute overnight events. She reports pain in R hip. She denies SOB, CP, N/V, fever or chills. She had a BM this morning. All other ROS reviewed and negative.Reviewed PMH, PSH, allergies.Objective: Current medications: Current Facility-Administered Medications Medication Dose Route Frequency Provider Last Rate Last Admin  acetaminophen (TYLENOL) tablet 975 mg  975 mg Oral Q6H Padela, Graceann Congress, MD   975 mg at 08/01/23 0650  apixaban (ELIQUIS) tablet 2.5 mg  2.5 mg Oral Q12H Padela, Graceann Congress, MD   2.5 mg at 08/01/23 0843  bupivacaine PF 0.5% (MARCAINE) 0.5 % (5 mg/mL) injection           buPROPion XL (WELLBUTRIN XL) 24 hr tablet 150 mg  150 mg Oral Daily Joaquin Music, MD   150 mg at 08/01/23 0844  cholecalciferol (vitamin D3) tablet 2,000 Units  2,000 Units Oral Daily Joaquin Music, MD   2,000 Units at 08/01/23 0843  DULoxetine (CYMBALTA) DR capsule 60 mg  60 mg Oral Daily Joaquin Music, MD   60 mg at 08/01/23 0843  gabapentin (NEURONTIN) capsule 300 mg  300 mg Oral Nightly Joaquin Music, MD   300 mg at 07/31/23 2144  lidocaine-EPINEPHrine 1 %-1:100,000 injection           melatonin tablet 6 mg  6 mg Oral Nightly Joaquin Music, MD   6 mg at 07/31/23 2144  polyethylene glycol (MIRALAX) packet 17 g  17 g Oral BID Joaquin Music, MD   17 g at 07/31/23 2142  rosuvastatin (CRESTOR) tablet 20 mg  20 mg Oral Daily Joaquin Music, MD   20 mg at 08/01/23 0843  senna (SENOKOT) tablet 17.2 mg  2 tablet Oral BID Joaquin Music, MD   17.2 mg at 08/01/23 0843  sodium chloride 0.9 % flush 3 mL  3 mL IV Push Q8H Padela, Graceann Congress, MD   3 mL at 08/01/23 0650  sodium chloride 0.9 % flush 3 mL  3 mL IV Push Q8H Padela, Graceann Congress, MD   3 mL at 08/01/23 0650  tranexamic acid (CYKLOKAPRON) 1,000 mg/10 mL (100 mg/mL) injection           tranexamic acid (CYKLOKAPRON) 1,000 mg/10 mL (100 mg/mL) injection           vancomycin (VANCOCIN) 1,000 mg injection           zinc sulfate (ZINCATE) capsule 220 mg  220 mg Oral Daily Joaquin Music, MD   220 mg at 08/01/23 0843 Home medications:Vit c dailyGabapentin 300mg  QHSASA 81mg  dailyWellbutrin 100mg  dailyCymbalta 60mg  dailyMVI dailyMelatonin 5mg  qpmCrestor 40mg  dailyZinc dailyHeld: Vit c dailyASA 81mg  dailyMVI dailyZinc dailyDVT prophylaxis: eliquisDiet: RegularFoley: NoneLast bowel movement: 9/24 x1I/O's:Gross Totals (Last 24 hours) at 08/01/2023 0909Last data filed at 07/31/2023 1705Intake 1364.49 ml Output 575 ml Net 789.49 ml Physical Exam: BP 98/64  - Pulse 73  - Temp 97.8 ?F (36.6 ?C) (Oral)  - Resp 18  - Ht 5' (1.524 m)  - Wt 50.2 kg  - SpO2 95%  - BMI 21.61 kg/m? Oxygen: 95% on room airFSBG 97Access: PIVAppearance: female sitting up in chair, in NAD ENT: O/P clear, MM moist, neck supple Cardiovascular: RRR S1S2 no murmur Respiratory: LS clear GI: abdomen soft, NT, ND positive BS Musculoskelatal: R hip dressing clean and dry Skin: No rashes Vascular: No edema Neurologic: awake, alert x3,  CAM negative Psychiatric: normal affect Labs:Recent Results (from the past 24 hour(s)) CBC without differential  Collection Time: 08/01/23  5:18 AM Result Value Ref Range  WBC 7.0 4.0 - 11.0 x1000/?L  RBC 2.78 (L) 4.00 - 6.00 M/?L  Hemoglobin 8.7 (L) 11.7 - 15.5 g/dL  Hematocrit 16.10 (L) 96.04 - 45.00 %  MCV 96.4 80.0 - 100.0 fL  MCH 31.3 27.0 - 33.0 pg  MCHC 32.5 31.0 - 36.0 g/dL  RDW-CV 54.0 98.1 - 19.1 %  Platelets 94 (L) 150 - 420 x1000/?L  MPV 12.2 (H) 8.0 - 12.0 fL  ANC (Abs Neutrophil Count) 4.73 2.00 - 7.60 x 1000/?L Magnesium  Collection Time: 08/01/23  5:18 AM Result Value Ref Range  Magnesium 1.8 1.7 - 2.4 mg/dL Basic metabolic panel  Collection Time: 08/01/23  5:18 AM Result Value Ref Range  Sodium 138 136 - 144 mmol/L  Potassium 4.1 3.3 - 5.3 mmol/L  Chloride 105 98 - 107 mmol/L  CO2 23 20 - 30 mmol/L  Anion Gap 10 7 - 17  Glucose 97 70 - 100 mg/dL  BUN 15 8 - 23 mg/dL  Creatinine 4.78 2.95 - 1.30 mg/dL  Calcium 8.3 (L) 8.8 - 10.2 mg/dL  BUN/Creatinine Ratio 62.1 8.0 - 23.0  eGFR (Creatinine) >60 >=60 mL/min/1.37m2 Immature Platelet Fraction (BH GH LMW YH)  Collection Time: 08/01/23  5:18 AM Result Value Ref Range  Immature Platelet Fraction 11.8 (H) 1.2 - 8.6 %  Absolute Immature Platelet Fraction 11.1 <20.0 x1000/?L GFR: >60Urine macro: NoneMicro: MRSA PCR negative Diagnostics: No results found.ECG/Tele Events: NSR HR 75 q waves in antero septal leads, no new ischemic changesEcho: Normal left ventricular size, wall thickness, systolic function and wall motion. The left ventricular ejection fraction calculated by biplane Simpson's was 64%. Normal right ventricular cavity size and systolic function.  Estimated right ventricular systolic pressure is 65 mmHg compatible with pulmonary hypertension. Visually the left atrium appears normal.  Right atrium is normal in size. Trileaflet aortic valve.  No aortic stenosis.  Mild to moderate aortic regurgitation. Mild mitral regurgitation. Mild tricuspid regurgitation.  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 IVC diameter < 2.1 cm that collapses < 50% with a sniff suggests mildly increased RAP (5-10 mmHg, mean 8 mmHg). No significant pericardial effusion. No prior study available for comparison.Assessment: Holly Erickson is admitted R fem neck fx. She has hx of constipation, MDD, HLD, arrhythima, tremors, smoking. Active Problems: R fem neck fx s/p THA POD #2Acute blood loss anemia - with iron deficiencyhx of constipation, MDD, HLD, arrhythima, tremors, smoking. Plan: Hgb goal >7, no indication for transfusion Wean O2 for O2 sat >90%Resume home ASA when surgically stableDvt ppx w eliquis x 5 week Give iron IV x1 Aggressive bowel regimen Vit D level 47, Vit D3 800u daily indefinitelyshe is medically stable for discharge once primary team finds it appropiate> 35 minutes spent on care of patient, > 50% time spent coordinating careMedical decision making for this patient was moderate.Contacts: PCP:  Laury Deep, OsamaSigned:Prestin Munch Duaine [facility] APPSenior Nurse Practitioner IIIMHB: [phone] call if I can be of any assistance. 08/01/2023

## 2023-08-13 ENCOUNTER — Emergency Department: Admit: 2023-08-13 | Payer: PRIVATE HEALTH INSURANCE | Primary: Internal Medicine

## 2023-08-13 ENCOUNTER — Inpatient Hospital Stay
Admit: 2023-08-13 | Discharge: 2023-08-16 | Payer: PRIVATE HEALTH INSURANCE | Attending: Geriatric Medicine | Admitting: Internal Medicine | Primary: Internal Medicine

## 2023-08-13 ENCOUNTER — Telehealth: Admit: 2023-08-13 | Payer: PRIVATE HEALTH INSURANCE | Attending: Orthopedic Surgery | Primary: Internal Medicine

## 2023-08-13 ENCOUNTER — Encounter: Admit: 2023-08-13 | Payer: PRIVATE HEALTH INSURANCE | Attending: Emergency Medicine | Primary: Internal Medicine

## 2023-08-13 DIAGNOSIS — T84020A Dislocation of internal right hip prosthesis, initial encounter: Secondary | ICD-10-CM

## 2023-08-13 LAB — CBC WITH AUTO DIFFERENTIAL
BKR WAM ABSOLUTE IMMATURE GRANULOCYTES.: 0.03 x 1000/ÂµL (ref 0.00–0.30)
BKR WAM ABSOLUTE LYMPHOCYTE COUNT.: 0.62 x 1000/ÂµL (ref 0.60–3.70)
BKR WAM ABSOLUTE NRBC (2 DEC): 0 x 1000/ÂµL (ref 0.00–1.00)
BKR WAM ANC (ABSOLUTE NEUTROPHIL COUNT): 7.26 x 1000/ÂµL (ref 2.00–7.60)
BKR WAM BASOPHIL ABSOLUTE COUNT.: 0.03 x 1000/ÂµL (ref 0.00–1.00)
BKR WAM BASOPHILS: 0.4 % (ref 0.0–1.4)
BKR WAM EOSINOPHIL ABSOLUTE COUNT.: 0.02 x 1000/ÂµL (ref 0.00–1.00)
BKR WAM EOSINOPHILS: 0.2 % (ref 0.0–5.0)
BKR WAM HEMATOCRIT (2 DEC): 24 % — ABNORMAL LOW (ref 35.00–45.00)
BKR WAM HEMOGLOBIN: 7.6 g/dL — ABNORMAL LOW (ref 11.7–15.5)
BKR WAM IMMATURE GRANULOCYTES: 0.4 % (ref 0.0–1.0)
BKR WAM LYMPHOCYTES: 7.5 % — ABNORMAL LOW (ref 17.0–50.0)
BKR WAM MCH (PG): 32.6 pg (ref 27.0–33.0)
BKR WAM MCHC: 31.7 g/dL (ref 31.0–36.0)
BKR WAM MCV: 103 fL — ABNORMAL HIGH (ref 80.0–100.0)
BKR WAM MONOCYTE ABSOLUTE COUNT.: 0.3 x 1000/ÂµL (ref 0.00–1.00)
BKR WAM MONOCYTES: 3.6 % — ABNORMAL LOW (ref 4.0–12.0)
BKR WAM MPV: 10 fL (ref 8.0–12.0)
BKR WAM NEUTROPHILS: 87.9 % — ABNORMAL HIGH (ref 39.0–72.0)
BKR WAM NUCLEATED RED BLOOD CELLS: 0 % (ref 0.0–1.0)
BKR WAM PLATELETS: 284 x1000/ÂµL (ref 150–420)
BKR WAM RDW-CV: 18.3 % — ABNORMAL HIGH (ref 11.0–15.0)
BKR WAM RED BLOOD CELL COUNT.: 2.33 M/ÂµL — ABNORMAL LOW (ref 4.00–6.00)
BKR WAM WHITE BLOOD CELL COUNT: 8.3 x1000/ÂµL (ref 4.0–11.0)

## 2023-08-13 LAB — BASIC METABOLIC PANEL
BKR ANION GAP: 12 (ref 7–17)
BKR BLOOD UREA NITROGEN: 18 mg/dL (ref 8–23)
BKR BUN / CREAT RATIO: 22.5 (ref 8.0–23.0)
BKR CALCIUM: 8.3 mg/dL — ABNORMAL LOW (ref 8.8–10.2)
BKR CHLORIDE: 100 mmol/L (ref 98–107)
BKR CO2: 25 mmol/L (ref 20–30)
BKR CREATININE: 0.8 mg/dL (ref 0.40–1.30)
BKR EGFR, CREATININE (CKD-EPI 2021): >60 mL/min/{1.73_m2} (ref >=60)
BKR GLUCOSE: 103 mg/dL — ABNORMAL HIGH (ref 70–100)
BKR POTASSIUM: 4.1 mmol/L (ref 3.3–5.3)
BKR SODIUM: 137 mmol/L (ref 136–144)

## 2023-08-13 LAB — PARTIAL THROMBOPLASTIN TIME     (BH GH LMW Q YH): BKR PARTIAL THROMBOPLASTIN TIME: 32.8 s — ABNORMAL HIGH (ref 23.0–31.0)

## 2023-08-13 LAB — PROTIME AND INR
BKR INR: 1.17 — ABNORMAL HIGH (ref 0.87–1.13)
BKR PROTHROMBIN TIME: 12.5 s — ABNORMAL HIGH (ref 9.5–12.1)

## 2023-08-13 MED ORDER — PROPOFOL 10 MG/ML INTRAVENOUS EMULSION
10 | Freq: Once | INTRAVENOUS | Status: CP
Start: 2023-08-13 — End: ?
  Administered 2023-08-13: 22:00:00 10 mL via INTRAVENOUS

## 2023-08-13 MED ORDER — MORPHINE 2 MG/ML INJECTION SYRINGE
2 | Freq: Once | INTRAVENOUS | Status: CP
Start: 2023-08-13 — End: ?
  Administered 2023-08-13: 20:00:00 2 mL via INTRAVENOUS

## 2023-08-13 MED ORDER — ACETAMINOPHEN 325 MG TABLET
325 mg | Status: CP
Start: 2023-08-13 — End: 2023-08-17

## 2023-08-13 MED ORDER — MORPHINE 2 MG/ML INJECTION SYRINGE
2 | Freq: Once | INTRAVENOUS | Status: CP
Start: 2023-08-13 — End: ?
  Administered 2023-08-13: 21:00:00 2 mL via INTRAVENOUS

## 2023-08-13 MED ORDER — DIPHENHYDRAMINE 25 MG CAPSULE
25 mg | Status: CP
Start: 2023-08-13 — End: 2023-08-17

## 2023-08-13 MED ORDER — LIDOCAINE 4 % TOPICAL PATCH
4 % | Status: CP
Start: 2023-08-13 — End: 2023-08-17

## 2023-08-13 NOTE — ED Notes
 5:40 PM ortho down for reduction of right hip. Ed doc at bed side for propofol administration. 5:41 PMTime out completed. Reduction of right hip. 5:43 PMPropofol 25 mg IV administered by Dr Frederik Pear. 5:45 PM25 mg propofol administered by Dr Frederik Pear. 5:46 PM25 mg propofol administered by Dr Frederik Pear. 5:47 PMHip back into place. Knee immobilizer applied at bedside. Pending repeat x ray5:58 PMPatient wakes easily when spoken to. Oriented to person, place, vague to time. 6:05 PMPatient alert and oriented to person place and time. Son at bedside. Both aware of plan for ED obs. 6:53 PMPatient remains awake and alert. States pain is relieved. Plan for STR.

## 2023-08-13 NOTE — Telephone Encounter
 Copied from CRM #[id number]. Topic: Nursing Triage Note - [facility]>> Aug 13, 2023 12:54 PM Oralia Rud wrote:[facility]:  NURSE TRIAGE MESSAGETime of call:   12:54 PMCaller:   Bunnell, LYNNReceived TC from patients son, Tawanna Cooler. He was calling to cancel his mothers post op appt scheduled for today. He states she can't move she's in excruciating painPt is s/p 07/29/23  a right femoral neck fracture from a mechanical fall.  After medical risk stratification and preop optimization, patient was taken to the OR for a right total hip arthroplasty with Dr. Donnie Coffin on 07/30/23. Per pts son she was discharged to rehab yesterday. Pt was doing well and her pain was controlled. She was able to ambulate with her walker and only had minimal pain Pt fell last night while ambulating landing onto her right side. She was able to get up on her own. Per son pt was up and walking this morning. She went to get dressed and had a sudden onset of 10/10 pain to her operative hipPer the patient and her son she does not have any deformity or leg shortening. Pt is now unable to walk. Advised the son to call 911 and have his mother present to the ED for evaluationHe understands all instructions given and was thankful Nicholes Calamity, [facility] Nurse Advisor

## 2023-08-13 NOTE — Plan of Care
 Plan of Care Overview/ Patient Status    Case Management Screening and Evaluation  Flowsheet Row Most Recent Value Case Management Screening: Chart review completed. If YES to any question below then proceed to CM Eval/Plan  Is there a change in their cognitive function No Do you anticipate that the pt will have any discharge needs requiring CM intervention? Yes Has there been an unscheduled readmission within the last 30 days and/or four (4) encounters (encounters include: ED, OBS, Inpatient) within the last six (6) months? Yes Were there services prior to admission ( Examples: Assisted Living, HD, Homecare, Extended Care Facility, Methadone, SNF, Outpatient Infusion Center) Yes Negative/Positive Screen Positive Screening: Complete CM Evaluation and Plan Case manager will take lead to arrange post-acute care services and continue to work with the care team as the patient progresses towards discharge Yes Case Manager Attestation  I have reviewed the medical record and completed the above screen. CM staff will follow patient's progress and discuss the plan of care with the Treatment Team. Yes Case Management Evaluation and Plan  Arrived from prior to admission home/apartment/condo Admitted from: Home  Lives With Alone Services Prior to Admission home care agency (specify services) Home care agency name: Patient didn't know, just dc'd from [facility] yesterday with services in place Patient Requires Care Coordination Intervention Due To discharge planning needs/concerns, change in physical function Prior to Hospitalization: Assistance Needed/DME being used Ambulation Ambulation Assistance/DME: rollator, straight cane, manual wheelchair Documented Insurance Accurate Yes Any financial concerns related to anticipated discharge needs No Patient's home address verified Yes Patient's PCP of record verified Yes Last Date Seen by PCP 6-12 months Source of Clinical History Patient's clinical history has been reviewed and source of Information is: Patient, Medical record, Child(ren) Case Manager Attestation  I have reviewed the medical record and completed the above evaluation with the following recommendations. Yes Discharge Planning Coordination Recommendations  Discharge Planning Coordination Recommendations Needs not determined at this time Case Manager reviewed plan of care/ continuum of care need's with  Interdisciplinary Team, Family, Patient  82 y.o. 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 Met with patient and son Tawanna Cooler in the ED. Patient was discharged from [facility] yesterday following short rehab stay for right hip fracture s/p right hip arthroplasty. She was discharged home where she lives alone. Patient underwent another fall and now is s/p reduction for hip dislocation. Services were in place following dc from rehab but patient did not know the name of the agency (had not started yet). Both patient and son are hoping patient can return to rehab as they feel she was discharged too soon. Patient would like to return to [facility] if possible. Additional choices include [facility] and [facility]. Patient placed into observation pending PT eval. Will then need insurance authorization.  Valid PASRR on file from 9/23. CM will follow for PT eval, bed offer, and insurance auth if rehab is indicated.Rolm Baptise, RN, BSNED/OBS Case Manager

## 2023-08-14 ENCOUNTER — Encounter: Admit: 2023-08-14 | Payer: PRIVATE HEALTH INSURANCE | Primary: Internal Medicine

## 2023-08-14 DIAGNOSIS — E785 Hyperlipidemia, unspecified: Secondary | ICD-10-CM

## 2023-08-14 DIAGNOSIS — F32A Depression: Secondary | ICD-10-CM

## 2023-08-14 DIAGNOSIS — K635 Polyp of colon: Secondary | ICD-10-CM

## 2023-08-14 DIAGNOSIS — I499 Cardiac arrhythmia, unspecified: Secondary | ICD-10-CM

## 2023-08-14 DIAGNOSIS — K5900 Constipation, unspecified: Secondary | ICD-10-CM

## 2023-08-14 LAB — MSSA / MRSA SCREEN BY PCR   (BH GH LMW YH)
BKR MRSA NARES PCR: NEGATIVE
BKR SAUR NARES PCR: NEGATIVE

## 2023-08-14 LAB — VITAMIN D 25 (VITAMIN D STATUS)(MINMETABLAB)(YH): VITAMIN D 25-HYDROXY: 42 ng/mL (ref 20–50)

## 2023-08-14 MED ORDER — MORPHINE 2 MG/ML INJECTION SYRINGE
2 | Freq: Once | SUBCUTANEOUS | Status: CP
Start: 2023-08-14 — End: ?
  Administered 2023-08-14: 11:00:00 2 mL via SUBCUTANEOUS

## 2023-08-14 MED ORDER — ROSUVASTATIN 40 MG TABLET
40 | Freq: Every day | ORAL | Status: DC
Start: 2023-08-14 — End: 2023-08-17
  Administered 2023-08-14 – 2023-08-16 (×2): 40 mg via ORAL

## 2023-08-14 MED ORDER — APIXABAN 2.5 MG TABLET
2.5 | Freq: Two times a day (BID) | ORAL | Status: DC
Start: 2023-08-14 — End: 2023-08-17
  Administered 2023-08-14 – 2023-08-15 (×2): 2.5 mg via ORAL

## 2023-08-14 MED ORDER — BUPROPION HCL XL 150 MG 24 HR TABLET, EXTENDED RELEASE
150 | Freq: Every day | ORAL | Status: DC
Start: 2023-08-14 — End: 2023-08-14

## 2023-08-14 MED ORDER — GABAPENTIN 100 MG CAPSULE
100 | Freq: Two times a day (BID) | ORAL | Status: DC
Start: 2023-08-14 — End: 2023-08-17
  Administered 2023-08-14 – 2023-08-16 (×3): 100 mg via ORAL

## 2023-08-14 MED ORDER — MORPHINE 2 MG/ML INJECTION SYRINGE
2 | Freq: Once | INTRAVENOUS | Status: CP
Start: 2023-08-14 — End: ?
  Administered 2023-08-14: 08:00:00 2 mL via INTRAVENOUS

## 2023-08-14 MED ORDER — ASPIRIN 81 MG TABLET,DELAYED RELEASE
81 | ORAL | Status: DC
Start: 2023-08-14 — End: 2023-08-16
  Administered 2023-08-14: 14:00:00 81 mg via ORAL

## 2023-08-14 MED ORDER — MORPHINE 2 MG/ML INJECTION SYRINGE
2 | Freq: Once | INTRAVENOUS | Status: CP
Start: 2023-08-14 — End: ?
  Administered 2023-08-14: 04:00:00 2 mL via INTRAVENOUS

## 2023-08-14 MED ORDER — DULOXETINE 60 MG CAPSULE,DELAYED RELEASE
60 | Freq: Every day | ORAL | Status: DC
Start: 2023-08-14 — End: 2023-08-17
  Administered 2023-08-14 – 2023-08-16 (×2): 60 mg via ORAL

## 2023-08-14 MED ORDER — BUPROPION (WELLBUTRIN) IMMEDIATE RELEASE 50 MG HALFTAB
50 | Freq: Two times a day (BID) | ORAL | Status: DC
Start: 2023-08-14 — End: 2023-08-17
  Administered 2023-08-14 – 2023-08-16 (×4): 50 mg via ORAL

## 2023-08-14 MED ORDER — MORPHINE 2 MG/ML INJECTION SYRINGE
2 | Freq: Once | INTRAVENOUS | Status: CP
Start: 2023-08-14 — End: ?
  Administered 2023-08-14: 21:00:00 2 mL via INTRAVENOUS

## 2023-08-14 NOTE — Plan of Care
 Plan of Care Overview/ Patient Status    Inpatient Physical Therapy Evaluation IP Adult PT Eval/Treat - 08/14/23 0930    Date of Visit / Treatment  Date of Visit / Treatment 08/14/23   Note Type Evaluation   Progress Report Due 08/28/23   Start Time 850   End Time 930   Total Treatment Time 40    General Information  Pertinent History Of Current Problem Per chart 82 y.o. 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 PTA pt independent with ADLs, amb with SC as needed, son helps ( not available 24/7) At [facility] and d/c for one day. Home services haven't started yet.    Vital Signs and Orthostatic Vital Signs  Vital Signs Free text RA, NAD    Pain/Comfort  Pain Comment (Pre/Post Treatment Pain) c/o mod pain with mobility, nsging is aware    Cognition  Following Commands Follows one step commands without difficulty   Personal Safety / Judgment Fall risk;Decreased recognition / insight of own deficits    Range of Motion  Range of Motion Comments R LE in KI, L LE WFL    Manual Muscle Testing  Manual Muscle Testing Comments R LE 3/5, L LE +3/5 grossly    Muscle Tone  Muscle Tone Testing Results No muscle tone deficits noted    Coordination  Fine Motor Coordination essential tremors    Sensory Assessment  Sensory Tests Results No sensory impairment noted    Skin Assessment Skin Assessment See Nursing Documentation    Balance  Sitting Balance: Static  FAIR       Maintains static position without assist or device, may require Supervision or Verbal Cues (<2 minutes)   Sitting Balance: Dynamic  FAIR-     Performs dynamic activities through partial range (50-75%) with Contact Guard   Standing Balance: Static POOR+    Minimal assist to maintain static position with no Assistive Device   Standing Balance: Dynamic  POOR+   Moves through 1/2 range with minimal assist to right self   Balance Assist Device Rolling walker    Bed Mobility  Supine-to-Sit Independence/Assistance Level Minimum assist   Supine-to-Sit Assist Device Head of bed elevated;Bed rails;Draw pad   Sit-to-Supine Independence/Assistance Level Minimum assist   Sit-to-Supine Assist Device Bed rails;Draw pad   Bed Mobility, Impairments pain;strength decreased;decreased endurance/activity tolerance    Sit-Stand Transfer Training  Sit-to-Stand Transfer Independence/Assistance Level Minimum assist   Sit-to-Stand Transfer Assist Device Rolling walker   Stand-to-Sit Transfer Independence/Assistance Level Minimum assist   Stand-to-Sit Transfer Assist Device Rolling walker   Transfer Safety Analysis Impairments impaired balance;pain;decreased strength;decreased endurance/activity tolerance   Transfers were limited by: pain    Gait Training  Independence/Assistance Level  Minimum assist   Assistive Device  Rolling walker   Gait Distance 25 feet;x2   Gait Analysis Deviations decreased cadence;increased time in double stance;decreased velocity of limb motion;decreased step length;decreased stride length   Gait Analysis Impairments impaired  balance;pain;decreased strength;decreased endurance/activity tolerance   Gait Training Comments slow, unsteady pattern, slight buckling on R LE,    Handoff Documentation  Handoff Patient in bed;Bed alarm;Patient instructed to call nursing for mobility;Discussed with nursing    PT- AM-PAC - Basic Mobility Screen- How much help from another person do you currently need.....  Turning from your back to your side while in a a flat bed without using rails? 3 - A Little - Requires a little help (supervision, minimal assistance). Can use assistive devices.   Moving from lying on your back to sitting on the side of a flat bed without using bed rails? 3 - A Little - Requires a little help (supervision, minimal assistance). Can use assistive devices.   Moving to and from a bed to a chair (including a wheelchair)? 3 - A Little - Requires a little help (supervision, minimal assistance). Can use assistive devices.   Standing up from a chair using your arms(e.g., wheelchair or bedside chair)? 3 - A Little - Requires a little help (supervision, minimal assistance). Can use assistive devices.   To walk in a hospital room? 3 - A Little - Requires a little help (supervision, minimal assistance). Can use assistive devices.   Climbing 3-5 steps with a railing? 1 - Total - Requires total assistance or cannot do it at all.   AMPAC Mobility Score 16   TARGET Highest Level of Mobility Mobility Level 5, Stand for 1 minute   ACTUAL Highest Level of Mobility Mobility Level 7, Walk 25+ feet    Clinical Impression  Initial Assessment Pt tolerated evaluation well. Pt was min a x 1 for bed mobility, transfers and gait with RW. Pt amb with slow cadence, buckling on R LE  and easily fatigued. Pt lives alone and was only home one day prior to fall. Pt is moderate complexity.   Criteria for Skilled Therapeutic Interventions Met yes;treatment indicated   Rehab Potential good, to achieve stated therapy goals    Patient/Family Stated Goals  Patient/Family Stated Goal(s) decrease pain    Frequency/Equipment Recommendations  PT Frequency 3x per week   Next Treatment Expected 08/16/23   PT/PTA completing this assessment Ed   Equipment Needs During Admission/Treatment Rolling walker    PT Recommendations for Inpatient Admission  Activity/Level of Assist out of bed;ambulate;minimum assistance;with rolling walker;gait belt;in room    Planned Treatment / Interventions  Education Treatment / Interventions Patient Education / Training   role of PT, POC   PT Discharge Summary  Physical Therapy Disposition Recommendation Moderate complexity support and therapy to progress functional mobility/ ADLs/ IADLs recommended for post- acute care.  See assessment for additional details.   Equipment Recommendations for Discharge To be determined pending progress     Problem: Physical Therapy GoalsGoal: Physical Therapy GoalsDescription: PT GOALS1. Patient will perform bed mobility independently2. Patient will perform transfers with least assistive device with modified independence3. Patient will ambulate a minimum of 150 feet with least assistive device with modified independence Outcome: Interventions implemented as appropriate Lucia Estelle,  DPT  08/14/2023

## 2023-08-14 NOTE — ED Notes
 1930 PM Chief Complaint Patient presents with  Hip Pain   fentanyl given with relief, pelvic binder in place. Recent hip replacement, fell today, c/o right hip pain. On eliquis no head strike.  Received report from previous shift and assumed care of pt. Respirations even and non labored.0001 Pt resting in bed, c/o pain returning to hip. ER Provider informed an pt medicated as ordered.

## 2023-08-14 NOTE — Care Coordination-Inpatient
 INSURANCE AUTH OBTAINED FOR STR 08/14/23 1232 Authorization Information Date Authorization Initiated:  08/14/23 Time Authorization Initiated: 1232 Mode Clinical was sent: Portal Facility Name [facility] Facility Authorization yes Insurance Company [facility] Medicare Insurance Co. Solicitor Name/# Home and [employer] Authorization #/Details [id number], 3 Days, 10/8-10/10 Date Authorization recieved 08/14/23 Time Authorization recieved 1715 Follow up contact necessary Yes Contact Name Home and Community Contact phone: [phone] [facility] NilanTransition CoordinatorCare Management DepartmentYale-New [facility] [place], [place] Phone: [phone]

## 2023-08-14 NOTE — Utilization Review (ED)
 UM Status: Managed Medicare Obs S/p mechanical fall at home hx of right hip fx, right prosthetic hip dislocated and reduced in ED. Seen by Physical Therapy with recommendation for short term rehab, insurance auth obtained.

## 2023-08-14 NOTE — Telephone Encounter
 Copied from CRM #[id number]. Topic: General Inquiry - General Inquiry>> Aug 13, 2023 12:54 PM Mickel Crow wrote:Holly Erickson called regarding canceling today's post op appt with Holly Erickson appt had already been canceled through the automated system.  Holly Erickson states there was no way they could get her here, she is in too much pain, she was just discharged from rehab yesterday.  Holly Erickson was warm transferred to triage nurse, Holly Erickson. Patient will also need a post op appt rescheduled.  Thank you.

## 2023-08-14 NOTE — Care Coordination-Inpatient
 PENDING INSURANCE AUTHORIZATION THIS PATIENT CANNOT BE DISCHARGED UNTIL DETERMINATION OBTAINEDSecured facility: [employer] has been initiated with this patient's insurer. Clinical information submitted to this patient's insurer for review today. We now await the authorization that will allow this patient to transfer to the facility.   Insurance Contact: Home and CommunityPhone: [phone]: Pending Case No.: [id number] [facility] NilanTransition CoordinatorCare Management DepartmentYale-[facility] [place], [place] Phone: [phone]

## 2023-08-14 NOTE — Other
 [facility] ED Observation Progress NoteAdmit to Observation Status: Obs date: 08/13/2023  6:14 PMCondition:ImprovedVital signs: Normal  Yes BP (!) 106/55  - Pulse (!) 96  - Temp 98.3 ?F (36.8 ?C) (Oral)  - Resp 16  - SpO2 96% Vital signs reviewed per nursing notesGeneral:  Distress: Appears well, no distressHeart:  normal rateLungs:  Clear to auscultation bilaterally.  Normal respiratory effortNeuro: at baselineAbdomen: Soft, nontender YesSkin: Warm and well perfused Assessment/ Plan:Pt is an 82 y/o female with pmhx HLD, depression, recent right THA w/ Dr. Donnie Coffin after right hip fx, presented after a mechainical fall at home. Was recently discharged from short term rehab, son does not feel comfortable with her going back home and would like her to go back to rehab. 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 She is currently on Eliquis for DVT ppx.Placed in ED obs pending CC/ PT eval this AM. Seen by PT, recommending STR. Care coordination aware, pending insurance authorization.4:59 PM: discussed with care coordination, insurance authorization will definitely not take place today. Unsure if patient will be authorized to go to [facility]. Recommending pt be placed in Med obs status pending insurance authorization to short term rehab.Case discussed with Dr. Arelia Longest, PAResponse to management: UnchangedObservation course: AdmitI discussed the case with supervisee, agree with supervisee E/M note with corrections /additions as noted I did not personally see patientDylan Koren Bound, Denzil Magnuson, PA10/08/24 1712

## 2023-08-14 NOTE — ED Notes
 7:26 AM pt resting on stretcher, pt updated on plan of care for physical therapy consult. Pt is reporting 9/10 right hip pain. Pt provided french toast from cafeteria. Pt is aox4, speaking full clear sentences, respirations non-labored, skin cool and dry throughout, pt has +4 pitting edema in left leg.  Pt has strong pulses. Pts abd is soft and non-distended. Pts right leg in knee immobilizer. Pt is moving all other extremities spontaneously. Pt has no furth er needs at this time, call bell in reach.9:30 AM pt ambulated to bathroom using a walker. Pts gait was unsteady, pt recommends str placement.  Pt has no needs at this time.12:17 PMPt resting on stretcher with eyes closed, pts respirations non-labored.

## 2023-08-15 ENCOUNTER — Ambulatory Visit: Admit: 2023-08-15 | Payer: PRIVATE HEALTH INSURANCE | Primary: Internal Medicine

## 2023-08-15 ENCOUNTER — Encounter: Admit: 2023-08-15 | Payer: PRIVATE HEALTH INSURANCE | Attending: Internal Medicine | Primary: Internal Medicine

## 2023-08-15 ENCOUNTER — Encounter: Admit: 2023-08-15 | Payer: PRIVATE HEALTH INSURANCE | Primary: Internal Medicine

## 2023-08-15 DIAGNOSIS — T84020A Dislocation of internal right hip prosthesis, initial encounter: Secondary | ICD-10-CM

## 2023-08-15 DIAGNOSIS — K635 Polyp of colon: Secondary | ICD-10-CM

## 2023-08-15 DIAGNOSIS — I499 Cardiac arrhythmia, unspecified: Secondary | ICD-10-CM

## 2023-08-15 DIAGNOSIS — F32A Depression: Secondary | ICD-10-CM

## 2023-08-15 DIAGNOSIS — K5900 Constipation, unspecified: Secondary | ICD-10-CM

## 2023-08-15 DIAGNOSIS — E785 Hyperlipidemia, unspecified: Secondary | ICD-10-CM

## 2023-08-15 MED ORDER — ACETAMINOPHEN 325 MG TABLET
325 | Freq: Four times a day (QID) | ORAL | Status: DC | PRN
Start: 2023-08-15 — End: 2023-08-17
  Administered 2023-08-16: 02:00:00 325 mg via ORAL

## 2023-08-15 MED ORDER — SODIUM CHLORIDE 0.9 % (FLUSH) INJECTION SYRINGE
0.9 | Freq: Three times a day (TID) | INTRAVENOUS | Status: DC
Start: 2023-08-15 — End: 2023-08-17
  Administered 2023-08-16: 01:00:00 0.9 mL via INTRAVENOUS

## 2023-08-15 MED ORDER — SODIUM CHLORIDE 0.9 % (FLUSH) INJECTION SYRINGE
0.9 | INTRAVENOUS | Status: DC | PRN
Start: 2023-08-15 — End: 2023-08-17

## 2023-08-15 MED ORDER — PROTHROMBIN COMPLEX CONCENTRATES (PCC)
Freq: Once | INTRAVENOUS | Status: CP
Start: 2023-08-15 — End: ?
  Administered 2023-08-15: 14:00:00 100.000 mL/h via INTRAVENOUS

## 2023-08-15 MED ORDER — MORPHINE 2 MG/ML INJECTION SYRINGE
2 | Freq: Once | SUBCUTANEOUS | Status: CP
Start: 2023-08-15 — End: ?
  Administered 2023-08-15: 06:00:00 2 mL via SUBCUTANEOUS

## 2023-08-15 NOTE — H&P
 ORTHOPAEDICS & REHABILITATION Division of Adult ReconstructionHip & Knee Arthritis, Total Joint Replacement, and Revision [email or web address] Name:	Holly Erickson Number:	[id number] Date of Birth:	[birth year]Date of Visit:	10/7/2024Orthopedic Consultation NoteReason for consultation: Right Prosthetic Hip Dislocation Patient was seen and examined.  The chart, progress notes and imaging studies were reviewed.Holly Erickson is a 82 y.o. year old female who is seen today for evaluation of her right hip.  She has a history of a right total hip arthroplasty on July 30, 2023 with Dr. Donnie Coffin for a right hip fracture.  From the hospital after surgery she was discharged to a rehab facility and then yesterday she was sent home.  Home she sustained a ground level fall.  After a fall she complained of right hip pain and inability to bear weight on the right lower extremity.  She denies any injuries to her head.  She reports no loss of consciousness.  She denies any pain to her other extremities, head, neck, or back.  She denies any chest pain, dizziness, lightheadedness prior to her fall.  She currently lives at home alone and ambulates with a walker.X-rays in the ED for her right hip demonstrated a right prosthetic hip posterior dislocation.  She was consented for a right hip closed reduction in the ED under sedation on the right hip was successfully reduced.  The patient was neurovascularly intact before and after the reduction and postreduction x-rays done show reduced right hip.  There was no evidence of periprosthetic fracture.Past Medical History:Past Medical History: Diagnosis Date  Arrhythmia   Colon polyp   Constipation   Depression   Hyperlipidemia   Past Surgical History:Past Surgical History: Procedure Laterality Date  APPENDECTOMY    BACK SURGERY    COLONOSCOPY  03/11/2018  [facility]  HERNIA REPAIR    HYSTERECTOMY    OOPHORECTOMY Bilateral   PARATHYROIDECTOMY    REPLACEMENT TOTAL HIP W/  RESURFACING IMPLANTS Left   REPLACEMENT TOTAL KNEE BILATERAL    RHINOPLASTY    ROTATOR CUFF REPAIR Right   TONSILLECTOMY    Medications:Current Facility-Administered Medications Medication  acetaminophen  diphenhydrAMINE  lidocaine Current Outpatient Medications Medication Sig  apixaban Take 1 tablet (2.5 mg total) by mouth every 12 (twelve) hours.  ascorbic acid (VITAMIN C ORAL) Take by mouth daily.  aspirin Take 1 tablet (81 mg total) by mouth daily.  bisacodyL Place 1 suppository (10 mg total) rectally daily as needed for constipation.  buPROPion SR 1 tab(s) orally once a day for 30 days  camphor-menthoL Apply topically as needed for itching. Keep in fridge.  cholecalciferol (vitamin D3) Take 2 tablets (800 Units total) by mouth daily.  DULoxetine Take 1 capsule (60 mg total) by mouth daily.  enzymes,digestive (DIGESTIVE ENZYMES ORAL) Take by mouth daily.  gabapentin Take 1 capsule (100 mg total) by mouth 2 (two) times daily with breakfast and dinner.  gabapentin Take 1 capsule (300 mg total) by mouth.  melatonin Take 1 tablet (5 mg total) by mouth nightly.  morphine Take 1 tablet (15 mg total) by mouth every 6 (six) hours as needed for pain.  polyethylene glycol Take 1 packet (17 g total) by mouth daily. Mix in 8 ounces of water, juice, soda, coffee or tea prior to taking.  rosuvastatin Take 1 tablet (40 mg total) by mouth daily.  senna Take 2 tablets (17.2 mg total) by mouth 2 (two) times daily.  zinc gluconate Take 1 tablet (50 mg total) by mouth daily. Allergies:Allergies Allergen Reactions  Sodium Pentothal [Thiopental Sodium]    Childhood  Duragesic [Fentanyl] Itching and Dizziness   PATCH ONLY.. Oral form is ok Social History: reports that she has been smoking. She does not have any smokeless tobacco history on file. She reports that she does not currently use alcohol. Occupation: Occupational History  Not on file Review of Systems:I have reviewed the Review of Systems, and it is listed in the clinical support section of the record below. Physical Exam:Blood pressure (!) 106/55, pulse (!) 96, temperature 98.3 ?F (36.8 ?C), temperature source Oral, resp. rate 16, SpO2 96 %.There is no height or weight on file to calculate BMI.General: NADHEENT- Normal hearingCard- Extremities warm, well-perfusedResp- Non-labored breathing on room airPsych- CooperativeMSK-Right Upper Extremity-No gross deformity-No abrasions, lacerations, or ecchymosis-No tenderness to palpation over the shoulder, arm, elbow, forearm, wrist, or hand-Sensation intact to light touch over the median ulnar and radial nerve distributions-Motor intact over the AIN, PIN, ulnar, radial, and axillary distributions-brisk capillary refill distallyRight Lower Extremity-Hip incision c/d/i-No abrasions, lacerations, or ecchymosis-No tenderness to palpation over the, knee, leg, ankle, or foot. TTP to hip-Sensation intact to light touch over the sural, saphenous, superficial peroneal, deep peroneal, and tibial nerve distributions-Motor intact over the FHL, EHL, tibialis anterior, gastrocsoleus complex, and quad-2+ DP and PT with brisk capillary refill distallyLeft Upper Extremity-No gross deformity-No abrasions, lacerations, or ecchymosis-No tenderness to palpation over the shoulder, arm, elbow, forearm, wrist, or hand-Sensation intact to light touch over the median ulnar and radial nerve distributions-Motor intact over the AIN, PIN, ulnar, radial, and axillary distributions-2+ radial pulse with brisk capillary refill distallyLeft Lower Extremity-No gross deformity-No abrasions, lacerations, or ecchymosis-No groin pain with logroll-No tenderness to palpation over the femur, knee, leg, ankle, or foot-Sensation intact to light touch over the sural, saphenous, superficial peroneal, deep peroneal, and tibial nerve distributions-Motor intact over the FHL, EHL, tibialis anterior, gastrocsoleus complex, and quad-brisk capillary refill distallyRADIOGRAPHS:   EXAM: XR HIP RIGHT AP AND LATERAL INDICATION: s/p reduction COMPARISON: XR HIP RIGHT AP AND LATERAL 2023-08-13 16:17:22.000; XR HIP RIGHT AP AND LATERAL 2023-07-30; XR HIP RIGHT AP AND LATERAL 2023-07-29 FINDINGS: Bones: 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 There has been interval reduction of the right femoral and acetabular components. There is mild irregularity along the right inferior ramus. This could represent sequela of a nondisplaced fracture. The joints are intact. Soft Tissues: No radiopaque foreign body. Other: None IMPRESSION:1.         Interval reduction of the right hip arthroplasty.2.         There is mild irregularity along the right inferior ramus. This could represent sequela of a nondisplaced fracture. [facility] Radiology Notify System Classification:  Routine. Reported and signed by: Bradly Bienenstock, MD  [facility] Radiology and Biomedical ImagingRECENT LAB TESTING:  Lab Results Component Value Date  WBC 8.3 08/13/2023  HGB 7.6 (L) 08/13/2023  HCT 24.00 (L) 08/13/2023  MCV 103.0 (H) 08/13/2023  PLT 284 08/13/2023 Lab Results Component Value Date  CREATININE 0.80 08/13/2023  BUN 18 08/13/2023  NA 137 08/13/2023  K 4.1 08/13/2023  CL 100 08/13/2023  CO2 25 08/13/2023 Lab Results Component Value Date  INR 1.17 (H) 08/13/2023  INR 1.18 (H) 07/29/2023 ESR & CRP (Last 3 Values):No results found for: SEDRATE, CRPRECENT MICRO LAB TESTING:No results found for: MICROASSESSMENT: 82 y.o. female with a right prosthetic hip dislocation s/p mechanical fall on 10/7/24PLAN: - Activity: weight bearing as tolerated RLE, anterior hip precautions. Knee immobilizer. - Pain control per primary team- Continue home Eliquis 2.5mg  BID as directed for surgical ppx for 35 days post  op - PT prior to dispo- Case management for dispo planning - will possibly need to return to STR- Follow up with Dr. Donnie Coffin in 1-2 weeks for follow up and post op check- Ok to discharge per ortho team, pending PT eval and case management for dispo needsCase discussed with Dr. Peggyann Juba, M.D., M.S.cClinical Instructor, [facility] of MedicineDepartment of Orthopaedics & RehabilitationAdult Reconstruction Fellow

## 2023-08-15 NOTE — Plan of Care
 Plan of Care Overview/ Patient Status    [facility] liaison confirmed bed available this morning, Provider and bedside nurse were notified. Pt fell while trying to get OOB unassisted. Pt is waiting for head Wann scan result.Addendum: Riva scan with significant motion artifact, read as possible right cerebellar bleed,. Repeat head [place] showed the same. Radiologist suggested MRI for better evaluation. Pt is now being admitted as IP.[facility] worker was updated that pt will not dc today.

## 2023-08-15 NOTE — Utilization Review (ED)
 Fall this morningCT scan with significant motion artifact, read as possible right cerebellar bleed. Eliquis held, kcentra givenUpdated to IPUM Status: UR/MD agree on IP status Dr Angelene Giovanni RN, BSNUtilization Review Specialist

## 2023-08-15 NOTE — Other
 [facility]SRCOrthopedic Consult Note Patient name:  Holly Erickson MRN:	  [id number] Date of birth:	  October 31, 1942Date of admit:  10/7/2024Attending of record for this patient: Abbott Pao, MD   Provider leaving this note:	 Ranell Patrick, PA-C, Pager: [phone] Complaint: Right Prosthetic Hip DislocationHistory of Present Illness:Patient is a 82 y.o. female s/p right total hip arthroplasty on 07/30/23 with Dr. Donnie Coffin for a right hip fracture who was discharged from her rehab facility ([facility]) yesterday to home where she suffered a fall onto her left side with immediate pain in her right hip, and was transported to the [facility] ED Emergency Department for evaluation.  As a result of the fall, the patient felt pain immediately in the right hip and complained of an inability to bear weight on the right lower extremity.  She denies any head, neck or back injury  She also denies any chest pain, dizziness, or light headedness prior to the fall.  There was no reported loss of consciousness.  She has no other physical complaints at this time, and her mental status is at baseline. Her son is at bedside in ED. He is requesting patient stay for PT eval and possible discharge to short term rehab as he is not able to provide 24/7 care for her at home. Anticoagulation: Eliquis 2.5mg  BID for post op dvt ppx Lives at home alone Baseline ambulation: ambulates with walkerPrevious Orthopaedic: s/p R THA 07/30/23 w Donnie Coffin, B/L TKA, L THA Past Medical History:Past Medical History: Diagnosis Date  Arrhythmia   Colon polyp   Constipation   Depression   Hyperlipidemia  Past Surgical History: Procedure Laterality Date  APPENDECTOMY    BACK SURGERY    COLONOSCOPY  03/11/2018  [facility]  HERNIA REPAIR    HYSTERECTOMY    OOPHORECTOMY Bilateral   PARATHYROIDECTOMY    REPLACEMENT TOTAL HIP W/  RESURFACING IMPLANTS Left REPLACEMENT TOTAL KNEE BILATERAL    RHINOPLASTY    ROTATOR CUFF REPAIR Right   TONSILLECTOMY   Social History:Social History Socioeconomic History  Marital status: Married   Spouse name: Not on file  Number of children: Not on file  Years of education: Not on file  Highest education level: Not on file Occupational History  Not on file Tobacco Use  Smoking status: Every Day  Smokeless tobacco: Not on file Substance and Sexual Activity  Alcohol use: Not Currently  Drug use: Not on file  Sexual activity: Not on file Other Topics Concern  Not on file Social History Narrative  07/31/23: Pt lives alone in an apartment with elevator access. PTA pt independent with ADLs, amb with [facility] as needed, son is available and supportive Social Determinants of Health Financial Resource Strain: Not on file Food Insecurity: No Food Insecurity (07/29/2023)  Hunger Vital Sign   Worried About Running Out of Food in the Last Year: Never true   Ran Out of Food in the Last Year: Never true Transportation Needs: No Transportation Needs (07/29/2023)  PRAPARE - Designer, jewellery (Medical): No   Lack of Transportation (Non-Medical): No Physical Activity: Not on file Stress: Not on file Social Connections: Not on file Intimate Partner Violence: Not on file Housing Stability: Low Risk  (07/29/2023)  Housing Stability   Housing Stability: I have a steady place to live   Housing Stability: Not on file Medications:No current facility-administered medications for this encounter.Current Outpatient Medications:   apixaban (ELIQUIS) 2.5 mg tablet, Take 1 tablet (2.5 mg total) by mouth every 12 (twelve)  hours., Disp: , Rfl:   ascorbic acid (VITAMIN C ORAL), Take by mouth daily., Disp: , Rfl:   aspirin 81 mg EC delayed release tablet, Take 1 tablet (81 mg total) by mouth daily., Disp: , Rfl:   bisacodyL (DULCOLAX) 10 mg suppository, Place 1 suppository (10 mg total) rectally daily as needed for constipation., Disp: 12 suppository, Rfl:   buPROPion SR (WELLBUTRIN SR) 100 mg 12 hr tablet, 1 tab(s) orally once a day for 30 days, Disp: , Rfl:   camphor-menthoL (SARNA) 0.5-0.5 % lotion, Apply topically as needed for itching. Keep in fridge., Disp: 222 mL, Rfl: 2  cholecalciferol, vitamin D3, 10 mcg (400 unit) tablet, Take 2 tablets (800 Units total) by mouth daily., Disp: 30 tablet, Rfl: 2  DULoxetine (CYMBALTA) 60 MG capsule, Take 1 capsule (60 mg total) by mouth daily., Disp: , Rfl:   enzymes,digestive (DIGESTIVE ENZYMES ORAL), Take by mouth daily., Disp: , Rfl:   gabapentin (NEURONTIN) 100 mg capsule, Take 1 capsule (100 mg total) by mouth 2 (two) times daily with breakfast and dinner., Disp: , Rfl:   gabapentin (NEURONTIN) 300 mg capsule, Take 1 capsule (300 mg total) by mouth., Disp: , Rfl:   melatonin 5 mg tablet, Take 1 tablet (5 mg total) by mouth nightly., Disp: , Rfl:   morphine (MSIR) 15 mg tablet, Take 1 tablet (15 mg total) by mouth every 6 (six) hours as needed for pain., Disp: 20 tablet, Rfl: 0  polyethylene glycol (MIRALAX) 17 gram packet, Take 1 packet (17 g total) by mouth daily. Mix in 8 ounces of water, juice, soda, coffee or tea prior to taking., Disp: 14 each, Rfl: 2  rosuvastatin (CRESTOR) 40 mg tablet, Take 1 tablet (40 mg total) by mouth daily., Disp: , Rfl:   senna (SENOKOT) 8.6 mg tablet, Take 2 tablets (17.2 mg total) by mouth 2 (two) times daily., Disp: 56 tablet, Rfl: 0  zinc gluconate 50 mg tablet, Take 1 tablet (50 mg total) by mouth daily., Disp: , Rfl: Allergies:Sodium pentothal [thiopental sodium] and Duragesic [fentanyl]Review of Systems:11-point ROS otherwise negative except as noted aboveVitals:BP Readings from Last 1 Encounters: 08/13/23 110/79  Pulse Readings from Last 1 Encounters: 08/13/23 (!) 94  Resp Readings from Last 1 Encounters: 08/13/23 16  Temp Readings from Last 1 Encounters: 08/13/23 98.3 ?F (36.8 ?C) (Oral)  SpO2 Readings from Last 1 Encounters: 08/13/23 100% Physical Exam:General: Awake, alert, no acute distress, mental status at baselineHeart: Regular rate and rhythm, no murmurLungs: Clear to auscultation bilaterallyAbdomen: Soft, non-tender, + bowel soundsRight Hip: Shortened and internally rotated; + tenderness laterally to the proximal femur; unable to straight leg raise; + pain with passive hip flexion/extension as well as internal/external rotation; no ecchymosis; no laceration; calves soft and non-tender; tibialis anterior, gastrocnemius, extensor hallucis longus and flexor hallucis longus intactSkin intactDiagnostics:  XR Hip Right AP and LateralResult Date: 10/7/2024XR FEMUR RIGHT AP AND LATERAL, XR HIP RIGHT AP AND LATERAL PROVIDED INDICATION: Right hip pain with suspected right hip fracture. COMPARISON: NONE (accession [id number]), XR HIP RIGHT AP AND LATERAL 2023-07-30 (accession [id number]) FINDINGS: Status post right total hip arthroplasty. There is posterior and superior dislocation of the femoral component of the right hip orthopedic hardware relative to the acetabular component. Spinal fixation hardware is partially visualized. No acute fracture seen. The sacroiliac joints are intact.  There are pelvic phleboliths. Femur: S/p right knee replacement. No acute fracture or dislocation. Posterior and superior dislocation of the femoral component of the right hip prosthesis relative to the  acetabular component. Report initiated by:  Genia Harold, MD Reported and signed by: Vivianne Spence, MD  [facility] Radiology and Biomedical Imaging XR Femur Right AP and LateralResult Date: 10/7/2024XR FEMUR RIGHT AP AND LATERAL, XR HIP RIGHT AP AND LATERAL PROVIDED INDICATION: Right hip pain with suspected right hip fracture. COMPARISON: NONE (accession [id number]), XR HIP RIGHT AP AND LATERAL 2023-07-30 (accession [id number]) FINDINGS: Status post right total hip arthroplasty. There is posterior and superior dislocation of the femoral component of the right hip orthopedic hardware relative to the acetabular component. Spinal fixation hardware is partially visualized. No acute fracture seen. The sacroiliac joints are intact.  There are pelvic phleboliths. Femur: S/p right knee replacement. No acute fracture or dislocation. Posterior and superior dislocation of the femoral component of the right hip prosthesis relative to the acetabular component. Report initiated by:  Genia Harold, MD Reported and signed by: Vivianne Spence, MD  [facility] Radiology and Biomedical Imaging XR Chest PA or APResult Date: 10/7/2024XR CHEST PA OR AP INDICATION: fall COMPARISON: XR CHEST PA OR AP 2023-07-29 FINDINGS:   The cardiomediastinal silhouette is stable. The lungs are clear. The pleural spaces are clear. No pneumothorax is appreciated There is no acute osseous injury. Surgical fixation hardware projects over the lower thoracic spine.  No acute cardiothoracic abnormality. [place] [facility] Notify System Classification: Routine. Report initiated by:  Catha Brow, MD Reported and signed by: Vivianne Spence, MD  [facility] Radiology and Biomedical Imaging XR Hip Right AP and LateralResult Date: 9/24/2024XR HIP RIGHT AP AND LATERAL Clinical Indication: Post op right hip fracture repair.  Please include distal tip of prosthesis. Comparison: 07/29/2023 Findings: Status post right total hip arthroplasty. Hardware appears well seated. No new periprosthetic fracture is identified. Soft tissue swelling and foci of gas about the hip consistent with postoperative changes. Similar appearing left total hip arthroplasty. Degenerative changes of the visualized lower lumbar spine, SI joints and symphysis pubis. Bones appear demineralized. Impression: Right total hip arthroplasty with expected postsurgical soft tissue changes. [place] [facility] Notify System Classification: Routine. Reported and signed by: Gardiner Rhyme, MD  [facility] Radiology and Biomedical Imaging CTA Chest Abdomen aortic dissection (entire aorta visualized)Result Date: 9/22/2024CTA CHEST AND ABDOMEN Date: 07/29/2023 6:00 PM INDICATION: concern over aortic aneurysm? Patient reports from [place] and is followed for it. pre op. COMPARISON: [place] PELVIS WO IV CONTRAST 2023-07-29 09:33:10.000 TECHNIQUE: Prospectively ECG gated was utilized. [place] images of the chest were obtained without IV contrast. CTA images of the chest and abdomen were obtained after administration of IV contrast. Coronal MIP reformats are provided. Contrast administered: 70 ML IOHEXOL (OMNIPAQUE) 350 MG IODINE/ML INJECTION FINDINGS: Aorta: No aortic aneurysm or dissection. Aortic branch vessels: Patent with a conventional branch pattern. No significant stenosis involving the celiac artery, SMA, IMA and renal arteries. The cross-sectional diameters of the aorta are in mm as follows : Sinus of Valsalva: 37 x 32 x 32 Sinotubular junction: 34 Mid ascending aorta: 34 Ascending proximal to the brachiocephalic: 31 Top of the aortic arch proximal to left subclavian: 27 Proximal descending distal to left subclavian: 26 Mid descending thoracic aorta: 20 Aorta at the diaphragm: 21 Suprarenal aorta: 23 Infrarenal aorta: 17 Most distal infrarenal aorta is not visualized, however is seen on [place]mer pelvis from 07/29/2023 which demonstrated no aortic aneurysm. Heart / vessels: The cardiac chambers are visually normal in size. There are mild coronary calcifications with focal stenosis in the proximal circumflex/obtuse marginal.. There is no pericardial effusion. Pulmonary artery is normal in size. Lungs / airways /  pleura: The lungs are clear. Diffuse mucous plugging is seen. Bibasilar atelectasis. No pleural effusion. Mediastinum, chest lymph nodes: Unremarkable. Evaluation of the abdomen limited by streak artifact from the posterior spinal fusion hardware. Hepatobiliary system: Unremarkable. Pancreas: Unremarkable. Spleen: Unremarkable. Adrenal glands: Unremarkable. Kidneys: Unremarkable. GI tract: No bowel obstruction. Abdominal lymph nodes: No lymphadenopathy. Musculoskeletal system and soft tissue: No aggressive osseous lesion. Posterior spinal fusion hardware is noted in the thoracolumbar spine with laminectomy at L1 and L2. Findings are confirmed on the MIP images. No aortic aneurysm. [facility] Radiology Notify System Classification: Routine. Reported and signed by: Lupita Leash, MD  [facility] Radiology and Biomedical Imaging Echo 2D Complete w Doppler and CFI if Ind Image Enhancement 3D and or bubblesResult Date: 07/29/2023 * Normal left ventricular size, wall thickness, systolic function and wall motion. The left ventricular ejection fraction calculated by biplane Simpson's was 64%. * Normal right ventricular cavity size and systolic function.  Estimated right ventricular systolic pressure is 65 mmHg compatible with pulmonary hypertension. * Visually the left atrium appears normal.  Right atrium is normal in size. * Trileaflet aortic valve.  No aortic stenosis.  Mild to moderate aortic regurgitation. * Mild mitral regurgitation. * Mild tricuspid regurgitation. 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 * IVC diameter < 2.1 cm that collapses < 50% with a sniff suggests mildly increased RAP (5-10 mmHg, mean 8 mmHg). * No significant pericardial effusion. * No prior study available for comparison.US Guidance for Anesthesia BlockResult Date: 9/22/2024DISCLAIMER  This procedure captures images only.  There is no report.XR Chest PA or APResult Date: 9/22/2024XR CHEST PA OR AP Date: 07/29/2023 12:25 PM INDICATION: pre-operative. COMPARISON: XR CHEST PA AND LATERAL 2018-11-14 FINDINGS: Support Devices: None Heart size is within normal limits. The lungs are clear. There is an interface in the apex of the right lung with some I believe is a skinfold and not a pneumothorax. Status post Harrington rod placement  Normal preoperative chest [facility] Radiology Notify System Classification: Routine. Reported and signed by: Rexford Maus, MD  [facility] Radiology and Biomedical Imaging [place] Pelvis wo IV ContrastResult Date: 9/22/2024CT PELVIS WO IV CONTRAST HISTORY: concern over right hip fracture COMPARISON: Hip x-ray dated July 29, 2023 TECHNIQUE: [place] images of pelvis were obtained without contrast. FINDINGS: BOWEL: Diverticulosis without evidence of diverticulitis. The appendix is surgically removed. PERITONEUM: Unremarkable. LYMPH NODES: Unremarkable. VESSELS: Limited evaluation shows diffuse atherosclerotic disease of the aorta. URINARY BLADDER: Unremarkable. PELVIC VISCERA: Streak artifact from left hip arthroplasty is partially limits evaluation pelvic viscera. Within these limitations, there is no obvious abnormality. BONES & SOFT TISSUE: Decreased bone density. Degenerative changes of the visualized lower lumbar spine along with L4-5 laminectomy. Complete subcapital fracture with impaction mild valgus angulation. Femoral head is well-seated within the acetabulum. Moderate degenerative change. 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  Subcapital right femoral fracture. Infiltration of the cutaneous fat posterior lateral to the right intratrochanteric/subtrochanteric region. Subcapital right femoral fracture. [place] Radiology Notify System Classification: Routine. Report initiated by:  Clair Gulling, MD Reported and signed by: Rosey Bath, MD  [facility] Radiology and Biomedical Imaging Lower Leg 2V LeftResult Date: 07/29/2023**FRACTURE** STUDY: XR HIP RIGHT AP AND LATERAL, XR TIBIA FIBULA LEFT AP AND LATERAL INDICATION: s/p fall COMPARISON: Left hip radiograph dated November 14, 2018. FINDINGS: Right hip: Subcapital right femoral fracture with impaction and subcapital mild valgus angulation. Mild degenerative changes. Left hip: Postsurgical changes of left hip arthroplasty. No  evidence of hardware complication. No fractures or dislocations. Chronic degenerative changes. Left tibia/fibula: No fracture or dislocation. Patient status post left knee replacement. No prosthetic or periprosthetic fracture. No evidence for loosening. Subcapital right femoral fracture. [place] Radiology Notify System Classification: Routine. (accession [id number]), Routine. (accession [id number]) Report initiated by:  Dorina Hoyer, MD Reported and signed by: Rosey Bath, MD  [facility] Radiology and Biomedical Imaging XR Hip RightResult Date: 07/29/2023**FRACTURE** STUDY: XR HIP RIGHT AP AND LATERAL, XR TIBIA FIBULA LEFT AP AND LATERAL INDICATION: s/p fall COMPARISON: Left hip radiograph dated November 14, 2018. FINDINGS: Right hip: Subcapital right femoral fracture with impaction and subcapital mild valgus angulation. Mild degenerative changes. Left hip: Postsurgical changes of left hip arthroplasty. No evidence of hardware complication. No fractures or dislocations. Chronic degenerative changes. Left tibia/fibula: No fracture or dislocation. Patient status post left knee replacement. No prosthetic or periprosthetic fracture. No evidence for loosening. Subcapital right femoral fracture. [place] Radiology Notify System Classification: Routine. (accession [id number]), Routine. (accession [id number]) Report initiated by:  Dorina Hoyer, MD Reported and signed by: Rosey Bath, MD  [facility] Radiology and Biomedical Imaging [place] Head wo IV ContrastResult Date: 9/22/2024EXAM: [place] of the head without contrast. INDICATION: unwitnessed fall COMPARISON: No prior similar studies are available for comparison. TECHNIQUE: Serial axial images were obtained from the skull base to the vertex without the administration of intravenous contrast.  Coronal and sagittal reconstructions are provided. FINDINGS: There is limitation related to motion and beam hardening artifact. Nonspecific white matter hypoattenuation is noted, likely representing small vessel ischemic changes. Lentiform nucleus calcification is present. No intracranial hemorrhage. No gross [place] evidence for acute territorial infarct; MRI can be obtained as required. No mass effect or hydrocephalus. There is minimal mucosal thickening in the visualized portion of the paranasal sinuses. The visualized portion of the mastoid air cells are clear. There is no depressed calvarial fracture. No [place] evidence for acute intracranial pathology. [place] [facility] Notify System Classification: Routine. Reported and signed by: Rosey Bath, MD  [facility] Radiology and Biomedical Imaging Labs:Lab Results Component Value Date  WBC 8.3 08/13/2023  HGB 7.6 (L) 08/13/2023  HCT 24.00 (L) 08/13/2023  MCV 103.0 (H) 08/13/2023  PLT 284 08/13/2023 Lab Results Component Value Date  BUN 15 08/01/2023 Lab Results Component Value Date  CREATININE 0.80 08/01/2023 Lab Results Component Value Date  NA 138 08/01/2023  K 4.1 08/01/2023  CL 105 08/01/2023  CO2 23 08/01/2023 Lab Results Component Value Date  INR 1.17 (H) 08/13/2023  INR 1.18 (H) 07/29/2023 Procedure:After speaking with the patient and Dr. Donnie Coffin, the risks vs benefits of alternative therapy was discussed.  It was agreed upon and the patient consented for a right hip closed reduction.  Sedation was administered by the ER MD.  The right hip was successfully reduced.  The patient was neurovascularly intact before and after the reduction. Post-reduction x-rays were taken and reviewed; the reduction was successful and there is no evidence of periprosthetic fracture.Assessment:82 y.o. female with a right prosthetic hip dislocation s/p mechanical fall today. Plan:- Activity: weight bearing as tolerated RLE, anterior hip precautions. Knee immobilizer. - Pain control per ED- Continue home Eliquis 2.5mg  BID as directed for surgical ppx for 35 days post op - Dispo per ED - pt needs to work with PT - Case management for dispo planning - will possibly need to return to [facility]- Follow up with Dr. Donnie Coffin in 1-2 weeks for ED follow up and post op check (message sent to scheduler) - Ok to discharge per  ortho team, pending PT eval and case management for dispo needsCase D/w Dr. Philipp Deputy Signed by Ranell Patrick, PA-C, October 7, 2024Pager [phone]

## 2023-08-15 NOTE — ED Notes
 10:09 AM c- collar dc per Dr. Ramond Dial

## 2023-08-15 NOTE — Plan of Care
 Plan of Care Overview/ Patient Status    PT recs [facility]. CM sent a message to [employer]	[place] to follow up on the previous referral. Facility offered. Discussed with patient and she accepted. Pt has active PASSR with assessment ID no. [id number]. CM alerted insurance team to initiate auth.Addendum: [phone] Insurance is still pending. PA and pt were updated.[id number]: Insurance auth received. Spoke with nursing supervisor Suzette Battiest who stated that pt is not listed for admission today. We will follow up in am. Pt and son were updated.

## 2023-08-15 NOTE — ED Notes
 8:10 pt got oob unassisted- attempted to close curtain, fell -? Hit head , denies pain related to fallNo LoC, rt knee immobilizer remain in place, c-collar placed, log rolled placed in back board and back to bed w/ several staff, Vs taken - Dr. Ramond Dial informed/at bedside noted order for CTPt moved to C2h more visible area

## 2023-08-15 NOTE — ED Provider Notes
 Chief Complaint Patient presents with  Hip Pain   fentanyl given with relief, pelvic binder in place. Recent hip replacement, fell today, c/o right hip pain. On eliquis no head strike.  HPI/PE:82 y/o female w/ PMHx of arrhythmia HL depression recent R hip Sx (Dr. Donnie Coffin) no ws/p STR reporting R hip pain after she fell onto her L side last night. Denies any other injury. Received fentanyl en route. Uncomfortable elderly female in no resp distress, vs wnl Tender R hip + NVI, RLE shortened internally rotatedMDM:DDx includes hip dislocation vs fx vs contusion. Will plan for XRs, morphine also ordered.   Physical ExamED Triage Vitals [08/13/23 1521]BP: (!) 114/50Pulse: (!) 91Pulse from  O2 sat: n/aResp: 20Temp: 98.3 ?F (36.8 ?C)Temp src: OralSpO2: 100 % BP (!) 91/53  - Pulse (!) 95  - Temp 98.2 ?F (36.8 ?C) (Oral)  - Resp 18  - SpO2 99% Physical Exam ED Supervised SedationDate/Time: 08/13/2023 5:41 PMPerformed by: Abbott Pao, MDAuthorized by: Abbott Pao, MD  Time out documented by nurse: yesIndications:   Sedation purpose:  Dislocation reduction  Procedure necessitating sedation performed by:  Physician performing sedation  Intended level of sedation:  Moderate (conscious sedation)Pre-sedation assessment:   NPO status caution: urgency dictates proceeding with non-ideal NPO status    Neck mobility: normal    Mouth opening:  3 or more finger widths  History of difficult intubation: no    Pre-sedation assessment completed:  08/13/2023 5:40 PMImmediate pre-procedure details:   Reassessment: Patient reassessed immediately prior to procedure    Reviewed: vital signs, relevant labs/tests and NPO status    Verified: bag valve mask available, emergency equipment available, intubation equipment available, IV patency confirmed, oxygen available and suction available  Procedure details (see MAR for exact dosages):   Preoxygenation:  Nasal cannula  Sedation:  Propofol  Intra-procedure monitoring:  Continuous capnometry, blood pressure monitoring, frequent LOC assessments, frequent vital sign checks, continuous pulse oximetry and cardiac monitor  Intra-procedure events: none  Sedation start: 08/13/2023 5:41 PMSedation stop: 08/13/2023 5:47 PMPost-procedure details:   Post-sedation assessment completed:  08/13/2023 5:50 PM  Attendance: Constant attendance by certified staff until patient recovered    Recovery: Patient returned to pre-procedure baseline    Patient tolerance:  Tolerated well, no immediate complicationsComments:    75 mg propofol used (25m gx 3) with good effectAttestation/Critical CarePatient Reevaluation: XR showing R prosthetic hip dislocation, additional morphine given. Ortho notified will plan for bedside reduction. Bedside reduction by ortho, procedural sedation as above. Plan for ED obs Pt eval STR placement. ADMISSION TO ED OBSERVATIONAdmit to Observation Status: Obs date: 08/13/2023  6:14 PM  Indication for observation: CareCoordination/Geriatrics/GOCIn addition to serial exams and monitoring of hemodynamics, other anticipated interventions include: Geriatric/CareManagement recommendationsRelevant home medications to be ordered by me and patient handoff given per protocol Discharge criteria: CM recs/PT eval====================================Florette Thai MD, MBA: Patient received at signout placed in ED Obs pending a stability scan at 3pm. Received Kcentra by previous team. Will plan for for Dr. Sarita Haver in imaging concerning vs med admission for care coordination. Laying in bed, NAD4:53 PMRepeat scan showing stable subtle hyperdensity in the left cerebellum. Dr Sarita Haver from [place] have been an update. Patient will be admitted to medicine Comments as of 08/15/23 1656 Mon Aug 13, 2023 2001 Signout to me, hip dislocation, reduced, needs STR, CM/PT eval.  [ZB] Tue Aug 14, 2023 1936 Spoke with case management who states that prior Berkley Harvey has been approved.  Rehab bed will available in the morning.  We will  keep patient in ED observation and discharged in the morning. Spoke with patient who agrees with this plan. [DD] 1952 Patient signed out to me at shift change. Briefly, patient is here for fall after hip dislocation now s/p reduction. CM - prior authorization approved. Doreatha Martin, MDEmergency Medicine Attending [DY] Wed Aug 15, 2023 0128 Ed obs.PT is an 82 yo who is pending case management in am. Pt has received prior auth for placement. [SP]  Comments User Index[DD] Ollen Barges, MD[DY] Jenkins Rouge, MD[SP] Darrin Nipper M[ZB] Genelle Gather, MD   Clinical Impressions as of 08/15/23 1656 Closed dislocation of right hip, initial encounter [facility] Code)  ED DispositionAdmit Abbott Pao, MD10/07/24 2343 Ollen Barges, MD10/08/24 1938 Darrin Nipper M10/09/24 1132 Kiora Hallberg, MD10/09/24 1419 Chonte Ricke, MD10/09/24 1656 Dariela Stoker, [place], MD10/09/24 1656

## 2023-08-15 NOTE — ED Notes
 11:14 AM Right Hip pain evaluation. Moderate sedation for closed reduction.  Right knee immobilizer in place. OOB without assist.  Fall. +HS. Hypodense area. Neurosurgery consult.  Aspirin and eliquis.  Pending repeat [place] scan at 1500. Alert, oriented, forgetful. Collar off. Purewick in place. Report received. Hand off completed. 12:00 PMReassessment completed by ED MD Bayer.  Patient denies new headache. No obvious Neurological Changes. Pending 1500 [place] scan. 1:00 PMCall to patient room.  Requesting update to family.  Patient denies needs at this time. Updated on speaking with her son momentarily but would need to call back. 2:59 PMPatient to [place] scan at this time.  Patient without acute distress.  Provided reassurances for procedure.  Awaiting status update on completion at this time. 3:10 PMReport given. Hand off completed.

## 2023-08-15 NOTE — Progress Notes
 ORTHOPAEDICS & REHABILITATION	Orthopedic Surgery Progress Note - Adult Reconstruction ServiceAttending Provider: Neoma Laming, MD DOS: 07/30/23 - R THAS: Reports no issues overnight. Pain well controlled. Has been OOB with PT. Denies CP, SOBO:Vitals:Temp:  [98.2 ?F (36.8 ?C)-99.1 ?F (37.3 ?C)] 98.5 ?F (36.9 ?C)Pulse:  [74-96] 93Resp:  [18-19] 18BP: (94-116)/(54-68) 116/68SpO2:  [91 %-96 %] 92 %Device (Oxygen Therapy): room airPhysical Exam:NAD, Aox3Normal respiratory effortRLE:Incision open to air. Appears well healing. Sensation present grossly tn/sp/dpMotor: +ehl, fhl, ta, gastrosoleusFoot/toes wwpLabs:Lab Results Component Value Date  WBC 8.3 08/13/2023  HGB 7.6 (L) 08/13/2023  HCT 24.00 (L) 08/13/2023  MCV 103.0 (H) 08/13/2023  PLT 284 08/13/2023 Lab Results Component Value Date  CREATININE 0.80 08/13/2023  BUN 18 08/13/2023  NA 137 08/13/2023  K 4.1 08/13/2023  CL 100 08/13/2023  CO2 25 08/13/2023 Lab Results Component Value Date  INR 1.17 (H) 08/13/2023  INR 1.18 (H) 07/29/2023 A/P: Holly Erickson is a 82 y.o. s/p R THA with traumatic dislocation ~2 weeks post-op. Dislocation was closed reduced in the ED. Now planning for return to STR. - Activity: weight bearing as tolerated RLE, anterior hip precautions. Knee immobilizer. - Pain control per primary team- Appreciate medicine eval and recs- Continue home Eliquis 2.5mg  BID as directed for surgical ppx for 35 days post op - Follow up with Dr. Donnie Coffin in 1-2 weeks for follow up and post op check- Ok to discharge per ortho teamFuture Appointments Date Time Provider Department Center 08/28/2023  9:45 AM Arlana Lindau, MD ORTHO&REHAB [facility] 09/06/2023  9:45 AM Arlana Lindau, MD ORT [facility] Orth&Reh Signed:Ishanvi Mcquitty Legrand Rams, MD

## 2023-08-15 NOTE — ED Notes
 3:09 PM Verbal report received from off-going RN. This RN assumed care of PT at this time. Pending [place] results s/p fall this AM attempting to get OOB unassisted.

## 2023-08-15 NOTE — Progress Notes
 In short, Ms Hanigan is an 82 yo female s/p closed right prosthetic hip reduction in ED who has been in ED obs awaiting STR placement. Upon chart review, learned patient suffered a mechanical fall with headstrike while in ED this morning. Head [place] with significant artifact showed possible small right cerebellar bleed. ED has spoken with NSGY, plan for Kcentra and repeat head [place] at 3pm. Evaluated patient at bedside in ED. She is very upset with the care she has received during her ED stay but does appear oriented. Reports she fell to seated position then fell backwards hitting her head. Denies landing on her hip. No hip pain or back pain. She is mainly upset that her she feels like her son is not being updated by ED. On exam, right leg is not malrotated or shortened. No pain with logroll. No pain with palpation. Leg lengths approx equal. Knee immobilizer in place. Toes warm and well perfused, palpable pulses. Sensation intact to light touch.Low concern for hip dislocation or acute hip injury from fall at this time. Appreciate neurosurgery recs. - Please obtain XR right hip if increase in right hip pain or acute concern for dislocation- WBAT RLE, maintain knee immobilizer- Defer Eliquis management to NSGY - Ortho will continue to follow while in house Son [place] updated via phone at the request of the patient. Son expressed understanding and plans to come see patient later today. Electronically Signed by Ranell Patrick, PA-C, October 9, 2024SRC Joint Service can be contacted on [facility] Joint Serv Prov Dynamic Role at [phone]

## 2023-08-15 NOTE — Other
 lnternal Medicine Consult History provided by: the patientHistory limited by: no limitationsPatient presents from: Home I have personally seen and examined the patient.I have reviewed patient's old records and summarized them below.I have seen and examined the patient on 08/14/2023 7:17 PM  SUBJECTIVE:  Chief Complaint: fall HPI This is a 82 y.o. y/o female with PMHx significantMDD. HLD, smoking, iron deficiency anemia, recently discharged 08/01/2023  for R femoral neck fracture from a mechanical fall s/p R total hip arthroplasty, now returns to the ED for R hip pain after a fall at home, She denies any syncope, LOC, headstrike, chest pain, SOB, palpitations, she fell while using her walker.  In the ED seen to have RLE shortened and internally rotated.  Reduction done by Orthopedics in the ED. Per ED notes, son wants her evaluated for STR. On labs Hgb Hgb 8.7-> 7.6X-rays with superior and posterior aspects of prosthesis dislocated from acetabulum, repeat showing interval reduction.Given morphine 2 mg IV x 7 doses, propofol, home medications.Was to be admitted to medicine for placement at [facility] but per ED plan to discharge tomorrow morning to STR since insurance came through, and admission no longer necessary. MEDICAL HISTORY:  PMH PSH Past Medical History    Past Medical History: Diagnosis Date  Arrhythmia    Colon polyp    Constipation    Depression    Hyperlipidemia       Past Surgical History     Past Surgical History: Procedure Laterality Date  APPENDECTOMY      BACK SURGERY      COLONOSCOPY   03/11/2018   [facility]  HERNIA REPAIR      HYSTERECTOMY      OOPHORECTOMY Bilateral    PARATHYROIDECTOMY      REPLACEMENT TOTAL HIP W/  RESURFACING IMPLANTS Left    REPLACEMENT TOTAL KNEE BILATERAL      RHINOPLASTY      ROTATOR CUFF REPAIR Right    TONSILLECTOMY         Social History Family History Social History     Tobacco Use  Smoking status: Every Day  Smokeless tobacco: Not on file Substance Use Topics  Alcohol use: Not Currently        Family History Problem Relation Age of Onset  Coronary Artery Disease Mother    Ulcers Father    Coronary Artery Disease Father       MEDICATIONS No current facility-administered medications on file prior to encounter.       Current Outpatient Medications on File Prior to Encounter Medication Sig Dispense Refill  apixaban (ELIQUIS) 2.5 mg tablet Take 1 tablet (2.5 mg total) by mouth every 12 (twelve) hours.      ascorbic acid (VITAMIN C ORAL) Take by mouth daily.      aspirin 81 mg EC delayed release tablet Take 1 tablet (81 mg total) by mouth daily.      bisacodyL (DULCOLAX) 10 mg suppository Place 1 suppository (10 mg total) rectally daily as needed for constipation. 12 suppository    buPROPion SR (WELLBUTRIN SR) 100 mg 12 hr tablet 1 tab(s) orally once a day for 30 days      camphor-menthoL (SARNA) 0.5-0.5 % lotion Apply topically as needed for itching. Keep in fridge. 222 mL 2  cholecalciferol, vitamin D3, 10 mcg (400 unit) tablet Take 2 tablets (800 Units total) by mouth daily. 30 tablet 2  DULoxetine (CYMBALTA) 60 MG capsule Take 1 capsule (60 mg total) by mouth daily.  enzymes,digestive (DIGESTIVE ENZYMES ORAL) Take by mouth daily.      gabapentin (NEURONTIN) 100 mg capsule Take 1 capsule (100 mg total) by mouth 2 (two) times daily with breakfast and dinner.      gabapentin (NEURONTIN) 300 mg capsule Take 1 capsule (300 mg total) by mouth.      melatonin 5 mg tablet Take 1 tablet (5 mg total) by mouth nightly.      morphine (MSIR) 15 mg tablet Take 1 tablet (15 mg total) by mouth every 6 (six) hours as needed for pain. 20 tablet 0  polyethylene glycol (MIRALAX) 17 gram packet Take 1 packet (17 g total) by mouth daily. Mix in 8 ounces of water, juice, soda, coffee or tea prior to taking. 14 each 2  rosuvastatin (CRESTOR) 40 mg tablet Take 1 tablet (40 mg total) by mouth daily.      senna (SENOKOT) 8.6 mg tablet Take 2 tablets (17.2 mg total) by mouth 2 (two) times daily. 56 tablet 0  zinc gluconate 50 mg tablet Take 1 tablet (50 mg total) by mouth daily.         ALLERGIES     Allergies Allergen Reactions  Sodium Pentothal [Thiopental Sodium]       Childhood  Duragesic [Fentanyl] Itching and Dizziness     PATCH ONLY.. Oral form is ok     REVIEW OF SYSTEMS:  Review of Systems All other systems reviewed and are negative.  OBJECTIVE:  Vitals:Patient Vitals for the past 24 hrs:  BP Temp Temp src Pulse Resp SpO2 08/14/23 1422 112/62 -- -- (!) 95 18 (!) 93 % 08/14/23 1403 (!) 94/54 99.1 ?F (37.3 ?C) Oral (!) 96 18 (!) 91 % 08/14/23 0900 (!) 102/55 98.2 ?F (36.8 ?C) Oral (!) 93 18 (!) 91 % 08/14/23 0610 (!) 106/55 98.3 ?F (36.8 ?C) Oral (!) 96 16 96 % 08/14/23 0237 (!) 109/49 98.3 ?F (36.8 ?C) Oral 83 16 94 % 08/14/23 0007 (!) 138/56 98.6 ?F (37 ?C) Oral 84 14 94 %   PHYSICAL EXAM:  Physical ExamVitals reviewed. Constitutional:     General: She is not in acute distress.HENT:    Head: Normocephalic and atraumatic. Cardiovascular:    Rate and Rhythm: Normal rate and regular rhythm.    Pulses: Normal pulses.    Heart sounds: No murmur heard.Pulmonary:    Effort: Pulmonary effort is normal.    Breath sounds: Normal breath sounds. No wheezing. Chest:    Chest wall: No tenderness. Abdominal:    General: Bowel sounds are normal.    Palpations: Abdomen is soft.    Tenderness: There is no abdominal tenderness. Musculoskeletal:    Cervical back: Neck supple. No tenderness.    Right lower leg: No edema.    Left lower leg: No edema. Skin:   General: Skin is warm and dry.    Findings: No rash. Neurological:    Mental Status: She is alert.    LABORATORY DATA/IMAGING:  I have reviewed patient's laboratory results. LAB DATA:   Recent Labs   10/07/241701 WBC 8.3 HGB 7.6* HCT 24.00* PLT 284 MCV 103.0* NEUTROPHILS 87.9* MONOCYTES 3.6*    Recent Labs   10/07/241701 LABPROT 12.5* INR 1.17* PTT 32.8*    Recent Labs   10/07/241701 NA 137 K 4.1 CL 100 CO2 25 BUN 18 CREATININE 0.80 GLU 103* CALCIUM 8.3*   No results for input(s): ALT, AST, BILITOT, BILIDIR, ALBUMIN, ALKPHOS, TOTPROT, AMYLASE, LIPASE in the last 72 hours.  No results for input(s):  CKTOTAL, CKMB, CKMBINDEX, TROPONINT, TROPONINI, BNP in the last 72 hours. No results for input(s): COLORU, SPECGRAV, PHUR, BLOODU, PROTEINUA, GLUCOSEU, KETONESU, BILIRUBINUR, UROBILINOGEN, LEUKOCYTESUR, NITRITE, BACTERIA, WBCUA, RBCUA, MUCUS, AMORPHOUS in the last 72 hours. Invalid input(s): EPIUA MICRO DATA:No results found for: [place] IMAGING DATA:No results found. I have personally reviewed the CXR. The CXR shows no acute process.  EKG:I have personally reviewed the EKG. The EKG shows sinus rhythm, LAD     ASSESSMENT:  This is a 82 y.o. y/o female with PMHx significantMDD. HLD, smoking, iron deficiency anemia, recently discharged 08/01/2023  for R femoral neck fracture from a mechanical fall s/p R total hip arthroplasty, now s/p mechanical fall again with dislocation at the R hip, underwent successful reduction at the ED, now awaiting placement at [facility]. I have reviewed the patient's problem list and updated it as needed.  PLAN:  #R femoral neck fracture from a mechanical fall s/p R total hip arthroplasty c/b dislocation s/p  reduction-cont eliquis BID for VTE prophylaxis 35 days post op (THA date was 07/30/2023)-plan is to be discharged to STR per ED; no need for medicine admission for this as insurance will be processed for tomorrow's transfer in the AM-on morphine 15 mg IR prn at home for pain, pain control per EDContinue bowel regimen with miralax #?mood disorder-continue wellbutrin and cymbalta #?chronic pain-gabapentin #HLD-continue statin-unclear indication for aspirin, continue for now, consider PPI given concurrent aspirin and eliquis use    I spent 60 minutes today on this encounter before, during and after the visit, reviewing labs and records, evaluating and examining the patient, entering orders and documenting the visit. NOTIFICATIONS:  PCP: Morley Kos [phone] Signed:Zeph Riebel Jacquelynn Cree, MDHospitalist ServiceExt. [phone]

## 2023-08-15 NOTE — Other
 8:10 AMPatient signed out to self by night ED doc. In summation this is an 82 year old female who was admitted ED observation for PT OT and eventual placement in a rehab facility.  Patient resting the course of last night, I was just now called to the bedside, patient found on the floor after attempting to ambulate.  C-collar placed, patient replaced on stretcher, she is normocephalic alert oriented x3, there is no obvious midline cervical spinal deformity or tenderness.  She has no anterior chest wall tenderness, lung sounds are symmetrical, heart is regular, abdomen is benign.  Knee immobilizer is still in place left lower extremity.  There is no pain to internal external rotation of bilateral hips.  There are no obvious lower extremity bony abnormalities.  Within the differential now includes subdural hematoma and cervical spinal fracture/subluxation.  [place] scan of the head and cervical spine have been ordered.  Patient refuses analgesics at this time.10:04 AMCT scan with significant motion artifact, read as possible right cerebellar bleed.  Care and management discussed with Dr Sarita Haver, neurosurgery.  Plan for Kcentra at this time, will hold patient in the ED at [facility] for repeat [place] scan at 3:00 p.m. Will advise droperidol prior to scan to decreased possibility of motion artifact.Redgie Grayer, MD10/09/24 5284 Neoma Laming, MD10/09/24 1005

## 2023-08-15 NOTE — ED Notes
 3:05 PM Verbal report received from off-going RN. This RN assumed care of PT at this time. 6:51 PM Pt provided hot meal tray by PCT. 8:01 PM Plan for ED Obs for discharge tomorrow AM to SNF8:58 PM Brief changed, pending arrival of PM med through tube station3:19 AM Verbal report given to on-coming RN. PT care transferred at this time.

## 2023-08-15 NOTE — ED Notes
 3:17 AM Received report, assuming pt care at this time.3:54 AM Pt placed on bedpan to urinate, then cleaned and placed in clean/dry brief.7:10 AM Report given, care transferred at this time.

## 2023-08-16 ENCOUNTER — Inpatient Hospital Stay: Admit: 2023-08-16 | Payer: PRIVATE HEALTH INSURANCE

## 2023-08-16 ENCOUNTER — Encounter: Admit: 2023-08-16 | Payer: PRIVATE HEALTH INSURANCE | Attending: Medical | Primary: Internal Medicine

## 2023-08-16 DIAGNOSIS — Z860100 Personal history of colon polyps, unspecified: Secondary | ICD-10-CM

## 2023-08-16 DIAGNOSIS — R251 Tremor, unspecified: Secondary | ICD-10-CM

## 2023-08-16 DIAGNOSIS — Z886 Allergy status to analgesic agent status: Secondary | ICD-10-CM

## 2023-08-16 DIAGNOSIS — J439 Emphysema, unspecified: Secondary | ICD-10-CM

## 2023-08-16 DIAGNOSIS — D509 Iron deficiency anemia, unspecified: Secondary | ICD-10-CM

## 2023-08-16 DIAGNOSIS — Z96642 Presence of left artificial hip joint: Secondary | ICD-10-CM

## 2023-08-16 DIAGNOSIS — Z90710 Acquired absence of both cervix and uterus: Secondary | ICD-10-CM

## 2023-08-16 DIAGNOSIS — Z79899 Other long term (current) drug therapy: Secondary | ICD-10-CM

## 2023-08-16 DIAGNOSIS — T84020A Dislocation of internal right hip prosthesis, initial encounter: Secondary | ICD-10-CM

## 2023-08-16 DIAGNOSIS — S72001A Fracture of unspecified part of neck of right femur, initial encounter for closed fracture: Secondary | ICD-10-CM

## 2023-08-16 DIAGNOSIS — Z9049 Acquired absence of other specified parts of digestive tract: Secondary | ICD-10-CM

## 2023-08-16 DIAGNOSIS — Z7901 Long term (current) use of anticoagulants: Secondary | ICD-10-CM

## 2023-08-16 DIAGNOSIS — E785 Hyperlipidemia, unspecified: Secondary | ICD-10-CM

## 2023-08-16 DIAGNOSIS — Z96653 Presence of artificial knee joint, bilateral: Secondary | ICD-10-CM

## 2023-08-16 DIAGNOSIS — F329 Major depressive disorder, single episode, unspecified: Secondary | ICD-10-CM

## 2023-08-16 DIAGNOSIS — F17210 Nicotine dependence, cigarettes, uncomplicated: Secondary | ICD-10-CM

## 2023-08-16 LAB — IRON AND TIBC
BKR IRON SATURATION: 13 % — ABNORMAL LOW (ref 15–50)
BKR IRON: 31 ug/dL — ABNORMAL LOW (ref 37–145)
BKR TOTAL IRON BINDING CAPACITY: 239 ug/dL — ABNORMAL LOW (ref 250–450)

## 2023-08-16 LAB — MD SMEAR INTERPRETATION      (BH GH LMW YH)

## 2023-08-16 LAB — CBC WITH AUTO DIFFERENTIAL
BKR WAM ABSOLUTE IMMATURE GRANULOCYTES.: 0.02 x 1000/ÂµL (ref 0.00–0.30)
BKR WAM ABSOLUTE LYMPHOCYTE COUNT.: 1.4 x 1000/ÂµL (ref 0.60–3.70)
BKR WAM ABSOLUTE NRBC (2 DEC): 0 x 1000/ÂµL (ref 0.00–1.00)
BKR WAM ANC (ABSOLUTE NEUTROPHIL COUNT): 3.88 x 1000/ÂµL (ref 2.00–7.60)
BKR WAM BASOPHIL ABSOLUTE COUNT.: 0.04 x 1000/ÂµL (ref 0.00–1.00)
BKR WAM BASOPHILS: 0.6 % (ref 0.0–1.4)
BKR WAM EOSINOPHIL ABSOLUTE COUNT.: 0.28 x 1000/ÂµL (ref 0.00–1.00)
BKR WAM EOSINOPHILS: 4.4 % (ref 0.0–5.0)
BKR WAM HEMATOCRIT (2 DEC): 24.7 % — ABNORMAL LOW (ref 35.00–45.00)
BKR WAM HEMOGLOBIN: 7.9 g/dL — ABNORMAL LOW (ref 11.7–15.5)
BKR WAM IMMATURE GRANULOCYTES: 0.3 % (ref 0.0–1.0)
BKR WAM LYMPHOCYTES: 22.2 % (ref 17.0–50.0)
BKR WAM MCH (PG): 32.5 pg (ref 27.0–33.0)
BKR WAM MCHC: 32 g/dL (ref 31.0–36.0)
BKR WAM MCV: 101.6 fL — ABNORMAL HIGH (ref 80.0–100.0)
BKR WAM MONOCYTE ABSOLUTE COUNT.: 0.69 x 1000/ÂµL (ref 0.00–1.00)
BKR WAM MONOCYTES: 10.9 % (ref 4.0–12.0)
BKR WAM MPV: 9.9 fL (ref 8.0–12.0)
BKR WAM NEUTROPHILS: 61.6 % (ref 39.0–72.0)
BKR WAM NUCLEATED RED BLOOD CELLS: 0 % (ref 0.0–1.0)
BKR WAM PLATELETS: 278 x1000/ÂµL (ref 150–420)
BKR WAM RDW-CV: 18 % — ABNORMAL HIGH (ref 11.0–15.0)
BKR WAM RED BLOOD CELL COUNT.: 2.43 M/ÂµL — ABNORMAL LOW (ref 4.00–6.00)
BKR WAM WHITE BLOOD CELL COUNT: 6.3 x1000/ÂµL (ref 4.0–11.0)

## 2023-08-16 LAB — BASIC METABOLIC PANEL
BKR ANION GAP: 10 (ref 7–17)
BKR BLOOD UREA NITROGEN: 13 mg/dL (ref 8–23)
BKR BUN / CREAT RATIO: 18.6 (ref 8.0–23.0)
BKR CALCIUM: 8.4 mg/dL — ABNORMAL LOW (ref 8.8–10.2)
BKR CHLORIDE: 103 mmol/L (ref 98–107)
BKR CO2: 28 mmol/L (ref 20–30)
BKR CREATININE: 0.7 mg/dL (ref 0.40–1.30)
BKR EGFR, CREATININE (CKD-EPI 2021): >60 mL/min/{1.73_m2} (ref >=60)
BKR GLUCOSE: 90 mg/dL (ref 70–100)
BKR POTASSIUM: 3.4 mmol/L (ref 3.3–5.3)
BKR SODIUM: 141 mmol/L (ref 136–144)

## 2023-08-16 LAB — FERRITIN: BKR FERRITIN: 1050 ng/mL — ABNORMAL HIGH (ref 13–150)

## 2023-08-16 LAB — VITAMIN B12: BKR VITAMIN B12: 611 pg/mL (ref 232–1245)

## 2023-08-16 MED ORDER — SENNOSIDES 8.6 MG TABLET
8.6 | ORAL_TABLET | Freq: Two times a day (BID) | ORAL | 1 refills | Status: AC
Start: 2023-08-16 — End: ?

## 2023-08-16 NOTE — Plan of Care
 Plan of Care Overview/ Patient Status    Pt is being transferred to rehab. Called placed to sonto inform him that pt is on her way to rehab. Pt left with all her belongings

## 2023-08-16 NOTE — Plan of Care
 Plan of Care Overview/ Patient Status    Holly Erickson was discharged via ambulance.Verbalized understanding of discharge instructionsand recommended follow up care as per the after visit summary.  Written discharge instructions provided. Denies any further questions. Vital signs    Vitals:  08/15/23 2100 08/16/23 0522 08/16/23 0858 08/16/23 1438 BP:  (!) 150/73 123/67 121/71 Pulse:  (!) 92 89 (!) 94 Resp:  18 18 18  Temp:  98.4 ?F (36.9 ?C) 98.3 ?F (36.8 ?C) 97.3 ?F (36.3 ?C) TempSrc:  Oral Oral Oral SpO2:  98% (!) 91% 96% Weight: 50.3 kg    Height: 5' (1.524 m)    Patient confirmed all belongings returned. Belongings charted in last 7 days: Patient Valuables   Patient Valuables Flowsheet                    PATIENT VALUABLE(S)       [facility] Disposition At bedside/locker/closet 08/15/23 2147     Assumed care of patient from 0700-1900. Patient is alert and oriented x3 with intermittent periods of confusion noted. Reorientation provided successfully as needed. Afebrile, VSS. Lung sounds diminished on RA. Denies SOB or CP. No evidence of cardiac or respiratory distress noted. No IV access - care team aware. Cardiac diet, tolerating well. Abdomen is soft and non-tender with (+) BS. BM noted during shift via bedpan. Bedrest majority of shift with RLE immobilizer in place. Assist x1 with RW when OOB. T/R Q2H provided with pillows utilized for extremity elevation. Safety measures maintained and call bell within reach. Patient is medically cleared for discharge. Report given via phone by writer to receiving RN. Patient's son updated regarding discharge plan with understanding verbalized. Ambulance en route during shift change. Receiving RN made aware. Problem: Adult Inpatient Plan of CareGoal: Plan of Care ReviewOutcome: Interventions implemented as appropriate Problem: Adult Inpatient Plan of CareGoal: Patient-Specific Goal (Individualized)Outcome: Interventions implemented as appropriate

## 2023-08-16 NOTE — H&P
 [facility]	 Medicine History & PhysicalHistory provided by: the patient and EMR reviewHistory limited by: no limitationsPatient presents from: HomeSubjective: Chief Complaint: FallHPI 82 y/o lady with PMHx of MDD, HLD, smoking, iron deficiency anemia who was recently discharged from the hospital on 08/01/2023 after a right total hip arthroplasty for a right femoral neck fracture from a mechanical fall presented to the ED on 10/7 after a fall at home. She had a dislocated right hip prosthesis and subsequently had a successful closed reduction by Ortho in the ED. She was awaiting placement to STR when she suffered a mechanical fall in the ED. Head [place] ? Cerebellar hemorrhage. She has been on Eliquis for DVT prophylaxis following hip surgery.This finding was discussed with Dr Sarita Haver, neurosurgery by ED physician Dr Ramond Dial. Eliquis and ASA were held and the patient was give Kcentra. Repeat head [place] showed Stable subtle hyperdensity in the left cerebellum. This remains indeterminant and could represent blood products, but is felt to represent subtle calcification. Consider MRI for a better evaluation. Currently she has no complaints.Medical History: PMH PSH Past Medical History: Diagnosis Date  Arrhythmia   Colon polyp   Constipation   Depression   Hyperlipidemia   Past Surgical History: Procedure Laterality Date  APPENDECTOMY    BACK SURGERY    COLONOSCOPY  03/11/2018  [facility]  HERNIA REPAIR    HYSTERECTOMY    OOPHORECTOMY Bilateral   PARATHYROIDECTOMY    REPLACEMENT TOTAL HIP W/  RESURFACING IMPLANTS Left   REPLACEMENT TOTAL KNEE BILATERAL    RHINOPLASTY    ROTATOR CUFF REPAIR Right   TONSILLECTOMY     Family History family history includes Coronary Artery Disease in her father and mother; Ulcers in her father.Social History  reports that she has been smoking. She does not have any smokeless tobacco history on file. She reports that she does not currently use alcohol. No history on file for drug use.Prior to Admission Medications Medications Prior to Admission Medication Sig Dispense Refill Last Dose  apixaban (ELIQUIS) 2.5 mg tablet Take 1 tablet (2.5 mg total) by mouth every 12 (twelve) hours.     ascorbic acid (VITAMIN C ORAL) Take by mouth daily.     aspirin 81 mg EC delayed release tablet Take 1 tablet (81 mg total) by mouth daily.     bisacodyL (DULCOLAX) 10 mg suppository Place 1 suppository (10 mg total) rectally daily as needed for constipation. 12 suppository    buPROPion SR (WELLBUTRIN SR) 100 mg 12 hr tablet 1 tab(s) orally once a day for 30 days     camphor-menthoL (SARNA) 0.5-0.5 % lotion Apply topically as needed for itching. Keep in fridge. 222 mL 2   cholecalciferol, vitamin D3, 10 mcg (400 unit) tablet Take 2 tablets (800 Units total) by mouth daily. 30 tablet 2   DULoxetine (CYMBALTA) 60 MG capsule Take 1 capsule (60 mg total) by mouth daily.     enzymes,digestive (DIGESTIVE ENZYMES ORAL) Take by mouth daily.     gabapentin (NEURONTIN) 100 mg capsule Take 1 capsule (100 mg total) by mouth 2 (two) times daily with breakfast and dinner.     gabapentin (NEURONTIN) 300 mg capsule Take 1 capsule (300 mg total) by mouth.     melatonin 5 mg tablet Take 1 tablet (5 mg total) by mouth nightly.     morphine (MSIR) 15 mg tablet Take 1 tablet (15 mg total) by mouth every 6 (six) hours as needed for pain. 20 tablet 0  polyethylene glycol (MIRALAX) 17 gram packet Take 1 packet (17 g total) by mouth daily. Mix in 8 ounces of water, juice, soda, coffee or tea prior to taking. 14 each 2   rosuvastatin (CRESTOR) 40 mg tablet Take 1 tablet (40 mg total) by mouth daily.     senna (SENOKOT) 8.6 mg tablet Take 2 tablets (17.2 mg total) by mouth 2 (two) times daily. 56 tablet 0   zinc gluconate 50 mg tablet Take 1 tablet (50 mg total) by mouth daily.     Allergies Allergies Allergen Reactions  Fentanyl Dizziness, Itching, Other (See Comments), Rash and Unknown   PATCH ONLY.. Oral form is oksweating duragesic patch only; can tolerate IV Itching.  Duragesic patch.sweating duragesic patch only; can tolerate IV Itching.  Duragesic patch.  PATCH ONLY.. Oral form is ok  Nsaids (Non-Steroidal Anti-Inflammatory Drug) Diarrhea and Other (See Comments)   Abdominal cramping. horrible cramps.  Sodium Pentothal [Thiopental Sodium]    Childhood  Review of Systems: Review of Systems Constitutional:  Negative for chills and fever. HENT:  Negative for congestion and rhinorrhea.  Respiratory:  Negative for cough and shortness of breath.  Cardiovascular:  Negative for chest pain and palpitations. Gastrointestinal:  Negative for abdominal pain, nausea and vomiting. Genitourinary:  Negative for dysuria and frequency. Neurological:  Negative for dizziness, speech difficulty, weakness (only right leg from her surgery), numbness and headaches. Psychiatric/Behavioral:  Negative for confusion.   Objective: Vitals:Last 24 hours: Temp:  [97.4 ?F (36.3 ?C)-99 ?F (37.2 ?C)] 97.4 ?F (36.3 ?C)Pulse:  [64-96] 64Resp:  [16-19] 18BP: (91-147)/(53-69) 147/69SpO2:  [92 %-100 %] 96 %Physical Exam: Physical ExamConstitutional:     General: She is not in acute distress.HENT:    Head: Normocephalic and atraumatic.    Nose: Nose normal. No congestion.    Mouth/Throat:    Mouth: Mucous membranes are dry.    Pharynx: Oropharynx is clear. Eyes:    General:       Right eye: No discharge.       Left eye: No discharge. Cardiovascular:    Rate and Rhythm: Normal rate and regular rhythm.    Heart sounds: Murmur heard. Pulmonary:    Effort: Pulmonary effort is normal. Breath sounds: Normal breath sounds. Abdominal:    Palpations: Abdomen is soft.    Tenderness: There is no abdominal tenderness. Musculoskeletal:    Right lower leg: No edema.    Left lower leg: No edema. Skin:   General: Skin is warm and dry. Neurological:    General: No focal deficit present.    Mental Status: She is alert and oriented to person, place, and time.    Cranial Nerves: No cranial nerve deficit.    Motor: Weakness (right lower leg weakness) present.    Coordination: Coordination normal.    Comments: Generalized tremor(chronic) Psychiatric:       Mood and Affect: Mood normal.       Behavior: Behavior normal.       Thought Content: Thought content normal.       Judgment: Judgment normal.  Labs: Last 24 hours: No results found for this or any previous visit (from the past 24 hour(s)).Diagnostics:[place] HEAD WO IV CONTRAST performed on Date:  August 15, 2023 INDICATION: headache post fall, eliquis, possible right cerebellar bleed-- prior [place] with significant motion artifact. COMPARISON: [place] head cervical spine without IV contrast performed earlier the same day. TECHNIQUE: Serial [place] axial images were obtained from the skull base to the vertex without the administration of intravenous contrast.  Coronal and sagittal reconstructions are provided. FINDINGS: Gray-white matter differentiation is intact without large acute infarct. Scattered regions of periventricular and subcortical white matter hypodensity are nonspecific, though often relate to chronic small vessel ischemia.  Stable appearance of previously seen left cerebellar hyperdensity. No acute intracranial hemorrhage.There is no mass effect, edema or midline shift. No extra-axial fluid collection is seen. Stable calcifications within the bilateral basal ganglia. The ventricles are symmetric and normal in size.   No significant osseous pathology. Hyperostosis frontalis interna Status post bilateral intraocular lens replacement.There is no significant paranasal sinus mucosal thickening.The mastoid air cells are clear. IMPRESSION:Stable subtle hyperdensity in the left cerebellum. This remains indeterminant and could represent blood products, but is felt to represent subtle calcification. Consider MRI for a better evaluation. [facility] Radiology Notify System Classification: Routine. ECG/Tele Events: I have reviewed the patient's ECG as resulted in the EMR.Assessment: 82 y/o lady with PMHx of MDD, HLD, smoking, iron deficiency anemia who was recently discharged from the hospital on 08/01/2023 after a right total hip arthroplasty for a right femoral neck fracture from a mechanical fall presented to the ED on 10/7 after a fall at home. She had a dislocated right hip prosthesis and subsequently had a successful closed reduction by Ortho in the ED. She was awaiting placement to STR when she suffered a mechanical fall in the ED. Head [place] ? Cerebellar hemorrhage. She has been on Eliquis for DVT prophylaxis following hip surgery.Plan: #Fall in the ED with ? Cerebellar hemorrhage.Consulted neuro surgery. As patient and [place] head are stable and is s/p Kcentra, monitor for now.Neuro checks every 2 to 4 hours overnightContinue to hold ASA and Eliquis#Fall at home and dislocated hip prosthesisS/p reductionWBAT RLE, maintain knee immobilizer Follow up with Dr. Donnie Coffin in 1-2 weeks for follow up and post op check #Anemia? Blood loss for recent surgeryCheck iron panelCheck B 12Blood smear interpretation#ChronicContinue Cymbalta, Bupropion, Crestor and Neurontin#DVT prophylaxisSCD#CodeFullDiscussed with patientAnemia - This patient met criteria for, or was diagnosed with anemia this admission.  Last Hgb/Hct7.6/24.00Click here to document/update type as appropriate. Refresh (Ctrl+F11) prior to signing. This box will disappear upon signing. Pertinent Labs within past 90 daysMCV- 103.0Iron- 33TIBC- 356Ferritin- none in the last 90 daysB12- 489LDH- none in the last 90 daysHaptoglobin- none in the last 90 days :[id number]}Secondary diagnoses occurring during hospitalization:HypocalcemiaNotifications: PCP: Pollyann Samples    I have personally notified the patient's primary care provider of this admission. Yes  I have personally discussed the plan with the patient and/or family. [facility] Care Planning Electronically Signed:Ranen Doolin, MD 08/15/2023, 7:30 PMAddendum: None

## 2023-08-16 NOTE — Plan of Care
 Plan of Care Overview/ Patient Status    Notes reviewed:Patient has Authorization for a Bed at [facility] to advise when patient will be Medically readyPatient has been in the [place] [employer]) Verne Lanuza RNMHB  [phone]

## 2023-08-16 NOTE — Plan of Care
 Plan of Care Overview/ Patient Status    Admission Note Nursing Holly Erickson is a 82 y.o. female admitted with closed dislocation of right hip from mechanical fall. Patient arrived from  ED. Pt arrived on unit with  R hip surgical wound, healing and R leg immobilizerPatient is   alert and oriented x 4 on admission. Pt became confused in the AM. HOH, tremors notedNo cardiac distress evidentPt on 2 L oxygen, no SOB notedContinent of bowel and bladder Vitals:  08/15/23 1500 08/15/23 1902 08/15/23 2023 08/15/23 2100 BP: (!) 91/53 112/61 (!) 147/69  Pulse: (!) 95 90 64  Resp: 18  18  Temp:   97.4 ?F (36.3 ?C)  TempSrc:   Oral  SpO2: 99% 96% 96%  Weight:    50.3 kg Height:    5' (1.524 m) Oxygen therapy Oxygen TherapySpO2: 96 %Device (Oxygen Therapy): nasal cannulaO2 Flow (L/min): 2I have reviewed patient valuables Belongings charted in last 7 days: Patient Valuables   Patient Valuables Flowsheet                    PATIENT VALUABLE(S)       [place] Disposition At bedside/locker/closet 08/15/23 2147     Comments:See flowsheets, patient education and plan of care for additional information. Problem: Adult Inpatient Plan of CareGoal: Patient-Specific Goal (Individualized)Outcome: Interventions implemented as appropriate Problem: Adult Inpatient Plan of CareGoal: Absence of Hospital-Acquired Illness or InjuryOutcome: Interventions implemented as appropriate Problem: Adult Inpatient Plan of CareGoal: Optimal Comfort and WellbeingOutcome: Interventions implemented as appropriate Problem: Adult Inpatient Plan of CareGoal: Readiness for Transition of CareOutcome: Interventions implemented as appropriate Problem: Skin Injury Risk IncreasedGoal: Skin Health and IntegrityOutcome: Interventions implemented as appropriate Problem: Fall Injury RiskGoal: Absence of Fall and Fall-Related InjuryOutcome: Interventions implemented as appropriate

## 2023-08-16 NOTE — Discharge Summary
 [facility]	Med/Surg Discharge SummaryPatient Data:  Patient Name: Holly Erickson Admit date: 08/13/2023 Age: 82 y.o. Discharge date: 08/16/2023 DOB: 06/14/1941	 Discharge Attending Physician: Jules Husbands *  MRN: [id number]	 PCP: Morley Kos, MD Principal Diagnosis: Closed dislocation of right hip, initial encounter (HC Code)Other Diagnosis: MDD, HLD, smoking (cigarettes), iron deficiency anemia, essentially tremor  Hospital Course: 82 y/o lady with PMHx of MDD, HLD, smoking, iron deficiency anemia and recent hospitalization for right total hip arthroplasty for a right femoral neck fracture from a mechanical fall who presented to the ED after a fall on 10/7. She was discharged from the hospital to rehab on 9/25 and completed her rehab course. After arriving at home, she fell and came to the ED. This fall occurred around 3 am on 10/07. She didn't feel dizzy, sudden weakness, postural or leg muscle giving out, chest pain, sob, or any vaso-vagal symptom. She says she fell simply because she tends to walk fast/impatiently. She had a dislocated right hip prosthesis from this fall and subsequently had a successful closed reduction by Ortho in the ED. She was awaiting placement to STR when she suffered a mechanical fall in the ED. Pt says the reason for her fall is again she tried to walk too fast. Two Head [place] (7 hrs apart, on 10/09) showed stable hyperdensity in the left cerebellum. Pt has been on Eliquis 2.5 mg BID for DVT prophylaxis post hip replacement, and on aspirin 81 mg daily as a historical med. Dr Sarita Haver from neurosurgery by ED physician Dr Ramond Dial together decided to hold Eliquis and aspirin, and the patient was give Kcentra (2,639 Units). MRI was done to ascertain whether hyperdensity on [place] represent blood products vs calcification. MRI showed no acute intracranial pathology. In particular, no imaging correlate to the previously described subtle left cerebellar hyperdensity on [place] examination -- likelihood of intracranial/intraparenchymal bleeding is exceedingly low.Pt is transferred to [facility] [place] 4 geriatric floor for continued monitoring and discharge planning. She is well appearing on 10/10. AAOx3, neurologically intact. Sitting comfortable in bed; with good appetite and oral intake. Pt  hasn't not endorsed any pain or medical complaint since arrival. We consulted orthopedics surgery regarding anticoagulation plan for pt at the rehab, and was instructed to resume Eliquis 2.5 mg BID for a total of 35 days post R hip arthoplasty (Pt's surgery is on 09/23; Eliquis course should end on10/28). In addition, pt doesn't have indication for taking aspirin. She agrees with our suggestion to discontinue aspirin. She feels well and ready to return to rehab. Cognitive Status at Discharge: intact and at pt's baseline. ISSUES TO BE ADDRESSED POST DISCHARGE: Rehab post R hip arthoplasty Fall prevention Discontinuation of anticoagulation for DVT ppx after window (recommended end date 10/28) Macrocytic anemia workup - fu peripheral smear, repeat CBC in 2-4 weeks Discharged Condition: goodDisposition: Rehabilitation Center (acute Rehab Unit in a Hospital; Substance abuse Rehab)Allergies Allergies Allergen Reactions  Fentanyl Dizziness, Itching, Other (See Comments), Rash and Unknown   PATCH ONLY.. Oral form is oksweating duragesic patch only; can tolerate IV Itching.  Duragesic patch.sweating duragesic patch only; can tolerate IV Itching.  Duragesic patch.  PATCH ONLY.. Oral form is ok  Nsaids (Non-Steroidal Anti-Inflammatory Drug) Diarrhea and Other (See Comments)   Abdominal cramping. horrible cramps.  Sodium Pentothal [Thiopental Sodium]    Childhood  Discharge vitals: Blood pressure 121/71, pulse (!) 94, temperature 97.3 ?F (36.3 ?C), temperature source Oral, resp. rate 18, height 5' (1.524 m), weight 50.3 kg, SpO2 96 %.Discharge Physical  Exam and Pertinent Findings of Physical Exam  Physical ExamConstitutional:     Comments: Thin appearing HENT:    Head: Normocephalic and atraumatic.    Right Ear: External ear normal.    Left Ear: External ear normal.    Nose: Nose normal.    Mouth/Throat:    Mouth: Mucous membranes are moist.    Pharynx: Oropharynx is clear. No oropharyngeal exudate. Cardiovascular:    Rate and Rhythm: Normal rate and regular rhythm.    Pulses: Normal pulses.    Heart sounds: Normal heart sounds. No murmur heard.Pulmonary:    Effort: Pulmonary effort is normal.    Breath sounds: Normal breath sounds. No wheezing.    Comments: Minimal bibasilar crackle bilaterally at posterior aucaltationChest:    Chest wall: No tenderness. Abdominal:    General: Abdomen is flat. Bowel sounds are normal.    Palpations: Abdomen is soft.    Tenderness: There is no abdominal tenderness. There is no right CVA tenderness, left CVA tenderness, guarding or rebound. Musculoskeletal:    Comments: Normal ROM of head and neck and in the upper extremities. Limited ROM lower extremities. Wearing a boot that spans and encloses the entire R leg. Skin:   General: Skin is warm.    Capillary Refill: Capillary refill takes less than 2 seconds.    Coloration: Skin is not jaundiced.    Findings: No bruising, erythema or lesion. Neurological:    General: No focal deficit present.    Mental Status: She is alert and oriented to person, place, and time. Mental status is at baseline.    Cranial Nerves: No cranial nerve deficit.    Sensory: No sensory deficit.    Motor: No weakness.    Coordination: Coordination normal.    Gait: Gait normal.    Deep Tendon Reflexes: Reflexes normal. Psychiatric:       Mood and Affect: Mood normal.       Behavior: Behavior normal.       Thought Content: Thought content normal. Judgment: Judgment normal. Pending Labs and Tests: Pending Lab Results   Order Current Status  MD smear interpretation In process  Discharge Medications: Current Discharge Medication List  CONTINUE these medications which have CHANGED  Details senna (SENOKOT) 8.6 mg tablet Take 2 tablets (17.2 mg total) by mouth 2 (two) times daily.Qty: 56 tablet, Refills: 0Start date: 08/16/2023, End date: 08/30/2023   CONTINUE these medications which have NOT CHANGED  Details apixaban (ELIQUIS) 2.5 mg tablet Take 1 tablet (2.5 mg total) by mouth every 12 (twelve) hours.  ascorbic acid (VITAMIN C ORAL) Take by mouth daily.  bisacodyL (DULCOLAX) 10 mg suppository Place 1 suppository (10 mg total) rectally daily as needed for constipation.Qty: 12 suppository  buPROPion SR (WELLBUTRIN SR) 100 mg 12 hr tablet 1 tab(s) orally once a day for 30 days  camphor-menthoL (SARNA) 0.5-0.5 % lotion Apply topically as needed for itching. Keep in fridge.Qty: 222 mL, Refills: 2  Associated Diagnoses: Pruritus  cholecalciferol, vitamin D3, 10 mcg (400 unit) tablet Take 2 tablets (800 Units total) by mouth daily.Qty: 30 tablet, Refills: 2  DULoxetine (CYMBALTA) 60 MG capsule Take 1 capsule (60 mg total) by mouth daily.  enzymes,digestive (DIGESTIVE ENZYMES ORAL) Take by mouth daily.  gabapentin (NEURONTIN) 100 mg capsule Take 1 capsule (100 mg total) by mouth 2 (two) times daily with breakfast and dinner.  melatonin 5 mg tablet Take 1 tablet (5 mg total) by mouth nightly.  polyethylene glycol (MIRALAX) 17 gram packet Take 1 packet (17  g total) by mouth daily. Mix in 8 ounces of water, juice, soda, coffee or tea prior to taking.Qty: 14 each, Refills: 2  rosuvastatin (CRESTOR) 40 mg tablet Take 1 tablet (40 mg total) by mouth daily.  zinc gluconate 50 mg tablet Take 1 tablet (50 mg total) by mouth daily.   STOP taking these medications   aspirin 81 mg EC delayed release tablet    morphine (MSIR) 15 mg tablet    .Pertinent lab findings and test results: Recent Results (from the past 24 hour(s)) Vitamin B12  Collection Time: 08/15/23 11:27 PM Result Value Ref Range  Vitamin B12 611 232 - 1,245 pg/mL Basic metabolic panel  Collection Time: 08/16/23  5:54 AM Result Value Ref Range  Sodium 141 136 - 144 mmol/L  Potassium 3.4 3.3 - 5.3 mmol/L  Chloride 103 98 - 107 mmol/L  CO2 28 20 - 30 mmol/L  Anion Gap 10 7 - 17  Glucose 90 70 - 100 mg/dL  BUN 13 8 - 23 mg/dL  Creatinine 4.09 8.11 - 1.30 mg/dL  Calcium 8.4 (L) 8.8 - 10.2 mg/dL  BUN/Creatinine Ratio 91.4 8.0 - 23.0  eGFR (Creatinine) >60 >=60 mL/min/1.60m2 CBC auto differential  Collection Time: 08/16/23  5:54 AM Result Value Ref Range  WBC 6.3 4.0 - 11.0 x1000/?L  RBC 2.43 (L) 4.00 - 6.00 M/?L  Hemoglobin 7.9 (L) 11.7 - 15.5 g/dL  Hematocrit 78.29 (L) 56.21 - 45.00 %  MCV 101.6 (H) 80.0 - 100.0 fL  MCH 32.5 27.0 - 33.0 pg  MCHC 32.0 31.0 - 36.0 g/dL  RDW-CV 30.8 (H) 65.7 - 15.0 %  Platelets 278 150 - 420 x1000/?L  MPV 9.9 8.0 - 12.0 fL  Neutrophils 61.6 39.0 - 72.0 %  Lymphocytes 22.2 17.0 - 50.0 %  Monocytes 10.9 4.0 - 12.0 %  Eosinophils 4.4 0.0 - 5.0 %  Basophil 0.6 0.0 - 1.4 %  Immature Granulocytes 0.3 0.0 - 1.0 %  nRBC 0.0 0.0 - 1.0 %  Absolute Lymphocyte Count 1.40 0.60 - 3.70 x 1000/?L  Monocyte Absolute Count 0.69 0.00 - 1.00 x 1000/?L  Eosinophil Absolute Count 0.28 0.00 - 1.00 x 1000/?L  Basophil Absolute Count 0.04 0.00 - 1.00 x 1000/?L  Absolute Immature Granulocyte Count 0.02 0.00 - 0.30 x 1000/?L  Absolute nRBC 0.00 0.00 - 1.00 x 1000/?L  ANC (Abs Neutrophil Count) 3.88 2.00 - 7.60 x 1000/?L Iron and TIBC  Collection Time: 08/16/23  5:54 AM Result Value Ref Range  Iron 31 (L) 37 - 145 ug/dL  Iron Saturation 13 (L) 15 - 50 %  TIBC 239 (L) 250 - 450 ug/dL Ferritin  Collection Time: 08/16/23  5:54 AM Result Value Ref Range  Ferritin 1,050 (H) 13 - 150 ng/mL  Pertinent Procedures - closed reduction of R hip in the ED- imaging studies:MRI Brain wo IV ContrastResult Date: 10/10/2024No acute intracranial pathology. In particular, no imaging correlate to the previously described subtle left cerebellar hyperdensity on [place] examination. [facility] Radiology Notify System Classification: Routine. Reported and signed by: Channing Mutters, MD  [facility] Radiology and Biomedical Imaging [place] Head wo IV ContrastResult Date: 10/9/2024Stable subtle hyperdensity in the left cerebellum. This remains indeterminant and could represent blood products, but is felt to represent subtle calcification. Consider MRI for a better evaluation. [facility] Radiology Notify System Classification: Routine. Report initiated by:  Margaretmary Dys, MD Reported and signed by: Bradly Bienenstock, MD  [facility] Radiology and Biomedical Imaging   Follow-up Information:[facility] [id number]  [place] [phone], Arma Heading [facility]  [phone] on 11/12/2024POST OP ORTHO8:00am(15 min.)Rubin, Milinda Pointer, MDYM Orthopaedics &Rehabilitation at [facility] Past Medical History: Diagnosis Date  Arrhythmia   Colon polyp   Constipation   Depression   Hyperlipidemia   Past Surgical History: Procedure Laterality Date  APPENDECTOMY    BACK SURGERY    COLONOSCOPY  03/11/2018  [facility]  HERNIA REPAIR    HYSTERECTOMY    OOPHORECTOMY Bilateral   PARATHYROIDECTOMY    REPLACEMENT TOTAL HIP W/  RESURFACING IMPLANTS Left   REPLACEMENT TOTAL KNEE BILATERAL    RHINOPLASTY    ROTATOR CUFF REPAIR Right   TONSILLECTOMY    Social History Family History Social History Tobacco Use  Smoking status: Every Day  Smokeless tobacco: Not on file Substance Use Topics  Alcohol use: Not Currently  Family History Problem Relation Age of Onset  Coronary Artery Disease Mother   Ulcers Father   Coronary Artery Disease Father   Raynelle Bring, MD PhDPGY-1, [place] [facility] Research Training Gerhard Perches MD PGY-2 Traditional Internal Medicine Addendum :

## 2023-08-16 NOTE — Other
 Holly Erickson  REQUEST  DOCUMENTATION-[facility] NOTE-Type of consult: [facility] Neurosurgery -New Consult: [id number] Holly Erickson / Location: [phone] / Brief Clinical Question: Fall in the ED ? Cerebellar hemorrhage. Patient on Eliquis/Callback Cell Phone: [phone] / Use .consultnotetemplate or add .consultrecommendation to your consult notes. Remind your attending to use .consultattestation /Please confirm receipt of this message by texting back ?OK?-2 Glena Norfolk Page sent to Neurosurgery [phone] at 8:13 PM.-Asencion Guisinger Dawson10/9/20248:10 [facility] [phone]

## 2023-08-16 NOTE — Plan of Care
 Case Management Plan  Flowsheet Row Most Recent Value Discharge Planning  Patient/Patient Representative goals/treatment preferences for discharge are:  dc to [facility]	[place] Patient/Patient Representative was presented with a list of facilities, agencies and/or dme providers and Referral(s) placed for: Short term rehabilitation (at a nursing facility) Facility/Geographical Preference(s) IT sales professional at [employer] has been secured and is available on: 08/15/23 Facility Name [facility]	[place] [facility] [employer] [facility]/Access CM D/C Readiness  PASRR completed and approved Yes Authorization number obtained, if required Yes Is there a 3 day INPATIENT Qualifying stay for Medicare Patients? N/A Medicare IM- signed, dated, timed and scanned, if required N/A DME Authorized/Delivered N/A No needs identified/ follow up with PCP/MD Yes Post acute care services secured W10 complete Yes Pri Completed and Accepted  N/A Is the destination address correct on the W10 Yes Finalized Plan  Expected Discharge Date 08/16/23 Discharge Disposition Skilled Nursing Facility  Plan of Care Overview/ Patient Status    Discharge to [facility] please call facility with report prior to patient leaving our campusFollow up per Primary Care teamElizabeth Kathie Rhodes) Couper [place] RNMHB  [phone]

## 2023-08-16 NOTE — Progress Notes
 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 Her hip arthroplasty was dislocated and the orthopedics team successfully performed a closed reduction. She was evaluated by PT in the ED who recommended discharge to rehab.On 10/9 AM she was found on the floor in the ED after attempting to ambulate. [place] head showed questionable 6mm left cerebellar hyperdensity whch was thought to represent motion artifact vs. Hemorrhage. She received reversal for her Eliquis. Repeat [place] head 6 hours later showed stable hyperdensity and was felt by radiology to more likely represent calcification rather than hemorrhage.Per primary team patient is neurologically doing well. Given this and given stable [place] and her [facility] has already been reversed, can continue to monitor for now. Recommend STAT [place] head for neurologic exam change. Would discuss with Orthopedics team regarding holding Eliquis for 2 weeks out of caution.Will discuss with Dr. Oletta Cohn, MDNeurosurgery Resident Pager: [phone]

## 2023-08-16 NOTE — Other
 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 She was recently discharged home from rehab but unfortunately on 10/7 re-presented to the ED after a fall at home. Her hip arthroplasty was dislocated and the orthopedics team successfully performed a closed reduction. She was evaluated by PT in the ED who recommended discharge to rehab. On 10/9 AM she was found on the floor in the ED after attempting to ambulate. [place] head showed questionable 6mm left cerebellar hyperdensity whch was thought to represent motion artifact vs. Hemorrhage. She received reversal for her Eliquis. Repeat [place] head 6 hours later showed stable hyperdensity and was felt by radiology to more likely represent calcification rather than hemorrhage.Plt 284, INR 1.17, PTT 32.8Review of Systems: negative except where noted in HPIPast Medical History: Diagnosis Date  Arrhythmia   Colon polyp   Constipation   Depression   Hyperlipidemia  Past Surgical History: Procedure Laterality Date  APPENDECTOMY    BACK SURGERY    COLONOSCOPY  03/11/2018  [facility]  HERNIA REPAIR    HYSTERECTOMY    OOPHORECTOMY Bilateral   PARATHYROIDECTOMY    REPLACEMENT TOTAL HIP W/  RESURFACING IMPLANTS Left   REPLACEMENT TOTAL KNEE BILATERAL    RHINOPLASTY    ROTATOR CUFF REPAIR Right   TONSILLECTOMY   Allergies Allergen Reactions  Fentanyl Dizziness, Itching, Other (See Comments), Rash and Unknown   PATCH ONLY.. Oral form is oksweating duragesic patch only; can tolerate IV Itching.  Duragesic patch.sweating duragesic patch only; can tolerate IV Itching.  Duragesic patch. PATCH ONLY.. Oral form is ok  Nsaids (Non-Steroidal Anti-Inflammatory Drug) Diarrhea and Other (See Comments)   Abdominal cramping. horrible cramps.  Sodium Pentothal [Thiopental Sodium]    Childhood OBJECTIVE:  Temp:  [97.4 ?F (36.3 ?C)-98.8 ?F (37.1 ?C)] 98.4 ?F (36.9 ?C)Pulse:  [64-95] 92Resp:  [16-18] 18BP: (91-150)/(53-73) 150/73NIBP MAP (mmHg) (calculated/READ ONLY):  [59-98] 98 Lab Results Component Value Date  WBC 8.3 08/13/2023  HGB 7.6 (L) 08/13/2023  HCT 24.00 (L) 08/13/2023  MCV 103.0 (H) 08/13/2023  PLT 284 08/13/2023  CREATININE 0.80 08/13/2023  BUN 18 08/13/2023  NA 137 08/13/2023  K 4.1 08/13/2023  CL 100 08/13/2023  CO2 25 08/13/2023  PTT 32.8 (H) 08/13/2023  INR 1.17 (H) 08/13/2023  Inpatient MedsCurrent Facility-Administered Medications Medication Dose Route Frequency Provider Last Rate Last Admin  acetaminophen (TYLENOL) 325 mg tablet           [Held by provider] apixaban (ELIQUIS) tablet 2.5 mg  2.5 mg Oral Q12H Orlean Bradford [place], [place]   2.5 mg at 08/14/23 2124  [Held by provider] aspirin EC delayed release tablet 81 mg  81 mg Oral Daily Orlean Bradford [place], [place]   81 mg at 08/14/23 0959  buPROPion [facility]) Immediate Release tablet 50 mg  50 mg Oral BID Orlean Bradford [place], [place]   50 mg at 08/15/23 2130  diphenhydrAMINE (BENADRYL) 25 mg capsule           DULoxetine (CYMBALTA) DR capsule 60 mg  60 mg Oral Daily Orlean Bradford [place], [place]   60 mg at 08/14/23 1000  gabapentin (NEURONTIN) capsule 100 mg  100 mg Oral BID WC Orlean Bradford [place], [place]   100 mg at 08/14/23 1727  lidocaine 4 % topical patch  rosuvastatin (CRESTOR) tablet 40 mg  40 mg Oral Daily Orlean Bradford [place], [place]   40 mg at 08/14/23 1000  sodium chloride 0.9 % flush 3 mL  3 mL IV Push Q8H [place] Dura, MD   3 mL at 08/15/23 2128  Neurological Exam:Alert, oriented to self, place, and yearPupils equal and reactive, extraoculars intactFace symmetric, tongue midlineSpeech fluentFollow commandsNo driftStrength is 5/5 throughout with normal tone except RLE in immobilizerSensation grossly intact to LTNo dysmetriaASSESSMENT:  45F here after fall found to have small cerebellar hyperdensity (calcification vs small hemorrhage) which is stable on repeat scan. Doing well neurologically. PLAN:  - No acute neurosurgical intervention- STAT [place] head for neuro exam change- Ok for diet- Ok for DVT prophylaxis dose but would discuss with Orthopedics if Eliquis can be changed to another easily reversible agent- Ok to liberalize neuro checks- Appreciate trauma recs. - Would hold any AC/AP x2 weeks, if not medically contraindicated- Normotension- Patient should follow up with HCT wo contrast in 2 weeks in Neurotrauma clinic. We have sent staff message to coordinate this effort. Please call [facility] with any additional questions. Signed:Shayonna Ocampo, MDNeurosurgery Resident ---------------------------Neurosurgery pager: [phone]:Sisto Granillo Dia Sitter, MDNeurosurgery Resident Neurosurgery Pager: [phone]---------------------------

## 2023-08-16 NOTE — Plan of Care
 Inpatient Physical Therapy Progress Note IP Adult PT Eval/Treat - 08/16/23 1540    Date of Visit / Treatment  Date of Visit / Treatment 08/16/23   Note Type Progress Note   Start Time 3p   End Time 340p   Total Treatment Time 40    General Information  Subjective I am afraid   General Observations found pt in bed on room air with KI on her RLE   Precautions/Limitations Fall Precautions;Right hip precautions   Precautions/Limitations Comment WBAT RLE with RLE in KI to prevent dislocation of R hip   Fall History yes, reason for admission    Weight Bearing Status  Weight Bearing Status Comments WBAT RLE in KI    Vital Signs and Orthostatic Vital Signs  Vital Signs Free text pt on room air found o2 in bed o2 sat 96%    Pain/Comfort  Pain Comment (Pre/Post Treatment Pain) pt with min pain rt hip    Patient Coping  Observed Emotional State calm;cooperative    Cognition  Overall Cognitive Status --   alert engaged,  Level of Consciousness alert   Following Commands Follows all commands and directions without difficulty   Personal Safety / Judgment Fall risk;Impulsive;Poor safety awareness    Skin Assessment  Skin Assessment See Nursing Documentation    Balance  Sitting Balance: Static  FAIR       Maintains static position without assist or device, may require Supervision or Verbal Cues (<2 minutes)   Sitting Balance: Dynamic  FAIR      Performs dynamic activities through 75% range Contact Guard or partial range (50-75%) with Supervision   Standing Balance: Static POOR+    Minimal assist to maintain static position with no Assistive Device   Standing Balance: Dynamic  POOR     Moves through 1/4 to 1/2 ROM range with moderate assist to right self   Balance Assist Device Rolling walker   Balance Skills Training Comment assist  of 1-2    Bed Mobility  Symptoms Noted During/After Treatment none   Supine-to-Sit Independence/Assistance Level Minimum assist;Assist of 1   Supine-to-Sit Assist Device Head of bed elevated;Draw pad   Sit-to-Supine Independence/Assistance Level Minimum assist;Assist of 2    Sit-Stand Transfer Training  Symptoms Noted During/After Treatment (Sit-to-Stand Transfer Training) none   Sit-to-Stand Transfer Independence/Assistance Level Minimum assist   Sit-to-Stand Transfer Assist Device Rolling walker   Stand-to-Sit Transfer Independence/Assistance Level Minimum assist   Stand-to-Sit Transfer Assist Device Rolling walker   Sit-Stand Transfer Comments pt required constant cues    Gait Training  Symptoms Noted During/After Treatment  none   Independence/Assistance Level  Minimum assist;Assist of 1 person + 1 person to manage equipment   Assistive Device  Rolling walker   Gait Distance 30 feet;x2   Gait Analysis Deviations decreased cadence   Gait Training Comments pt slow, needed inc cues to step to gait    Handoff Documentation  Handoff Patient in bed;Bed alarm;Patient instructed to call nursing for mobility;Discussed with nursing    PT- AM-PAC - Basic Mobility Screen- How much help from another person do you currently need.....  Turning from your back to your side while in a a flat bed without using rails? 3 - A Little - Requires a little help (supervision, minimal assistance). Can use assistive devices.   Moving from lying on your back to sitting on the side of a flat bed without using bed rails? 3 - A Little - Requires a little help (supervision, minimal assistance).  Can use assistive devices.   Moving to and from a bed to a chair (including a wheelchair)? 3 - A Little - Requires a little help (supervision, minimal assistance). Can use assistive devices.   Standing up from a chair using your arms(e.g., wheelchair or bedside chair)? 3 - A Little - Requires a little help (supervision, minimal assistance). Can use assistive devices.   To walk in a hospital room? 3 - A Little - Requires a little help (supervision, minimal assistance). Can use assistive devices.   Climbing 3-5 steps with a railing? 1 - Total - Requires total assistance or cannot do it at all.   AMPAC Mobility Score 16   TARGET Highest Level of Mobility Mobility Level 5, Stand for 1 minute   ACTUAL Highest Level of Mobility Mobility Level 7, Walk 25+ feet    Clinical Impression  Rehab Diagnosis pt with impaired mobility due to weakness   Follow up Assessment pt is alert, forgetful and very motivated for recovery, she will benefit from moderate complexity support upon discharge, she is NOT safe for indep gait at this time   Criteria for Skilled Therapeutic Interventions Met yes;treatment indicated   Rehab Potential good, to achieve stated therapy goals    Patient/Family Stated Goals  Patient/Family Stated Goal(s) take care of self    Frequency/Equipment Recommendations  PT Frequency 3x per week   Next Treatment Expected 08/17/23   PT/PTA completing this assessment Misty Stanley   Equipment Needs During Admission/Treatment Rolling walker    PT Recommendations for Inpatient Admission  Activity/Level of Assist assist of 1;ambulate;with rolling walker;in hall    Planned Treatment / Interventions  Plan for Next Visit inc gait   Education Treatment / Interventions Patient Education / Training   role of PT   PT Discharge Summary  Physical Therapy Disposition Recommendation Moderate complexity support and therapy to progress functional mobility/ ADLs/ IADLs recommended for post- acute care.  See assessment for additional details.   Equipment Recommendations for Discharge To be determined pending progress     Plan of Care Overview/ Patient Status

## 2023-08-17 ENCOUNTER — Encounter: Admit: 2023-08-17 | Payer: PRIVATE HEALTH INSURANCE | Attending: Neurological Surgery | Primary: Internal Medicine

## 2023-08-17 DIAGNOSIS — I614 Nontraumatic intracerebral hemorrhage in cerebellum: Secondary | ICD-10-CM

## 2023-08-20 ENCOUNTER — Telehealth: Admit: 2023-08-20 | Payer: PRIVATE HEALTH INSURANCE | Primary: Internal Medicine

## 2023-08-20 ENCOUNTER — Telehealth: Admit: 2023-08-20 | Payer: PRIVATE HEALTH INSURANCE | Attending: Neurological Surgery | Primary: Internal Medicine

## 2023-08-20 ENCOUNTER — Telehealth: Admit: 2023-08-20 | Payer: PRIVATE HEALTH INSURANCE | Attending: Orthopedic Surgery | Primary: Internal Medicine

## 2023-08-20 NOTE — Telephone Encounter
 Unable to LVM call would not process.

## 2023-08-20 NOTE — Telephone Encounter
 Patient calling to ask Dr. Donnie Coffin how long she needs to keep the leg immobilizer on requesting a call back to discuss thanks.

## 2023-08-20 NOTE — Telephone Encounter
 Spoke with patientShe is asking how long she needs to wear the knee immobilizer Per chart [phone] y.o. female with a right prosthetic hip dislocation s/p mechanical fall on 08/13/23 PLAN: - Activity: weight bearing as tolerated RLE, anterior hip precautions. Knee immobilizer. - Pain control per primary team- Continue home Eliquis 2.5mg  BID as directed for surgical ppx for 35 days post op - PT prior to dispo- Case management for dispo planning - will possibly need to return to [facility]- Follow up with Dr. Donnie Coffin in 1-2 weeks for follow up and post op check- Ok to discharge per ortho team, pending PT eval and case management for dispo needs Case discussed with Dr. Donnie Coffin Pt needs to schedule a follow up appt with Dr Donnie Coffin for 2 weeksPhone number confirmed

## 2023-08-21 ENCOUNTER — Telehealth
Admit: 2023-08-21 | Payer: PRIVATE HEALTH INSURANCE | Attending: Student in an Organized Health Care Education/Training Program | Primary: Internal Medicine

## 2023-08-21 NOTE — Telephone Encounter
 Pt called back she had an apt on 10/31 and it was rescheduled for 11/12 she is in a rehab facility now with no D/C date.  Should this be moved up and she is looking for a later apt if possibleThank you

## 2023-08-21 NOTE — Telephone Encounter
 Patient of Dr. Renelda Loma (THA 07/2023, recent dislocation 08/13/23) called answering service with significant hip pain and inability to ambulate after PT this morning. Denies any falls or trauma. Advised the patient to go to [facility] emergency department. Dr. Donnie Coffin aware.

## 2023-08-21 NOTE — Telephone Encounter
 Copied from CRM #[id number]. Topic: Scheduling - Schedule Appointment>> Aug 21, 2023  9:18 AM Tawanna Solo wrote:Placing outbound call to [facility] [place] [phone] to schedule an appointment LVM for facility to return call for scheduling.

## 2023-08-21 NOTE — Telephone Encounter
 Copied from CRM #[id number]. Topic: Scheduling - Schedule Appointment>> Aug 20, 2023  9:11 AM Tawanna Solo wrote:1st Attempt-Placing outbound call to [place], Deryn to schedule an appointment LVM for patient to return call for scheduling.[place] headReceived: 3 days agoGray, Mardene Celeste, RN  P [facility] Neurosurgery SchedulingHello,Patient needs an appointment in 2 weeks for a head [place] w/o contrast (order has been placed. Providers recommend imaging be done Mon-Thur, when possible), and a follow up appointment with Lind Guest PA-C to review imaging. Either day or platform should be fine for the visit with [place]. Per notes, pt was discharged to [facility] [place], please contact them to schedule. Thank you!

## 2023-08-23 ENCOUNTER — Telehealth: Admit: 2023-08-23 | Payer: PRIVATE HEALTH INSURANCE | Attending: Orthopedic Surgery | Primary: Internal Medicine

## 2023-08-23 NOTE — Telephone Encounter
 Returned call per MD notes patient to remain in immobilize.

## 2023-08-23 NOTE — Telephone Encounter
 Copied from CRM #[id number]. Topic: Scheduling - Schedule Appointment>> Aug 23, 2023  9:42 AM Deboraha Sprang wrote:[place], [place] called to schedule an appointment.[facility] CENTER MESSAGETime of call:   9:44 AMCaller:   AdrianaCaller's relationship to patient:  n/a  Calling from (pharmacy, hospital, agency, etc.):  [facility]   Reason for call:   Please advise Physical therapy at [facility] if patient can remove mobilizer while in bed and only use while walking.  Ph# [phone] - [facility]Provider:  Dr. Maurie Boettcher clinic visit date:  07/30/23 - Procedure: RIGHT TOTAL HIP ARTHROPLASTY Zella Ball CARE Center Scheduler

## 2023-08-28 ENCOUNTER — Encounter: Admit: 2023-08-28 | Payer: PRIVATE HEALTH INSURANCE | Attending: Orthopedic Surgery | Primary: Internal Medicine

## 2023-09-01 ENCOUNTER — Ambulatory Visit: Admit: 2023-09-01 | Payer: PRIVATE HEALTH INSURANCE | Primary: Internal Medicine

## 2023-09-03 ENCOUNTER — Telehealth: Admit: 2023-09-03 | Payer: PRIVATE HEALTH INSURANCE | Attending: Orthopedic Surgery | Primary: Internal Medicine

## 2023-09-03 NOTE — Telephone Encounter
 Copied from CRM #[id number]. Topic: General Message - YM CARE>> Sep 03, 2023  4:30 PM Dallie Dad wrote:YM CARE CENTER MESSAGETime of call:   4:30 PMCaller:   Bugge, LYNNCaller's relationship to patient:    Calling from [employer], hospital, agency, etc.):     Reason for call:   Ms. Duarte requested discharged from rehab early and is now home.  She would like to confirm with Dr. Donnie Coffin if she should be seen sooner now that she is home. Please advise.If not feeling well, what are symptoms:     If having symptoms, how long have the symptoms been present:     Does caller request to speak to someone urgently?     Best telephone number for callback:   [phone] Best time to return call:    Permission to leave message:     Ceasar Lund [facility] Referral Specialist

## 2023-09-03 NOTE — Telephone Encounter
 Patient s/p RTHA on 9/23.  Patient called from rehab they are trying to discharge her today.  Patient feels that she is unable to care for her at home.  Advised patient to discuss with Child psychotherapist and Conservation officer, nature.

## 2023-09-03 NOTE — Telephone Encounter
 Copied from CRM #[id number]. Topic: General Message - YM CARE>> Sep 03, 2023  9:19 AM Kynsie Falkner F wrote:YM CARE CENTER MESSAGETime of call:   9:19 AMCaller:   Yaun, LYNNCaller's relationship to patient:  n/a  Calling from (pharmacy, hospital, agency, etc.):  n/a   Reason for call:    Patient called to inform Dr. Marquita Palms that medicare has denied patient twice to continue her rehab care and will be discharged from  [facility] rehab in [place] today due to insurnace deny patients stay . Patient states she is unable to care for herself if she is sent  home , she cannot bend down has difficulty taking a shower by her self and has a full leg immobilizer. Patient is requesting to speak to nurse ofr Dr. Donnie Coffin for assistance asap.If not feeling well, what are symptoms:  n/a   If having symptoms, how long have the symptoms been present:  n/a   Does caller request to speak to someone urgently?  yes   Best telephone number for callback:   [phone] Best time to return call:   asap Permission to leave message:  yes

## 2023-09-04 ENCOUNTER — Encounter: Admit: 2023-09-04 | Payer: PRIVATE HEALTH INSURANCE | Attending: Neurological Surgery | Primary: Internal Medicine

## 2023-09-04 NOTE — Telephone Encounter
 Copied from CRM [id number]. Topic: Outbound Reschedule - YM CARE>> Sep 04, 2023  6:49 PM Dantavious Snowball C wrote:Called Shafer, Meira to reschedule appointment.Pt r/s to 11/1.

## 2023-09-04 NOTE — Telephone Encounter
 Copied from CRM #[id number]. Topic: General Message - YM CARE>> Sep 04, 2023 11:43 AM Raynald Blend wrote:YM CARE CENTER MESSAGETime of call:   11:43 AMCaller:   Raven, LYNNCaller's relationship to patient:  self  Reason for call:   Wanted to inform Dr. Donnie Coffin, Nedra Hai that she was out of rehab and back home. She stated that she was sent home due to insurance denial, medicare denied 2 appeals.  Best telephone number for callback:   [phone] Best time to return call:   Any Permission to leave message:  yes   [facility] Referral Specialist

## 2023-09-05 ENCOUNTER — Telehealth: Admit: 2023-09-05 | Payer: PRIVATE HEALTH INSURANCE | Attending: Orthopedic Surgery | Primary: Internal Medicine

## 2023-09-05 NOTE — Telephone Encounter
 NON-URGENTReconCaller: [facility] Eric[facility] HomePhone: [phone]: Larita Fife RalstonPt of: Dr. Nedra Hai RubinDOB: 10-Mar-2042Patient Came Home From Nursing Home And I Want To Double Check If The [facility] Orders For [place].CID: [id number]

## 2023-09-06 ENCOUNTER — Encounter: Admit: 2023-09-06 | Payer: PRIVATE HEALTH INSURANCE | Attending: Orthopedic Surgery | Primary: Internal Medicine

## 2023-09-07 ENCOUNTER — Encounter: Admit: 2023-09-07 | Payer: PRIVATE HEALTH INSURANCE | Attending: Emergency Medicine | Primary: Internal Medicine

## 2023-09-07 ENCOUNTER — Inpatient Hospital Stay: Admit: 2023-09-07 | Discharge: 2023-09-07 | Payer: PRIVATE HEALTH INSURANCE | Primary: Internal Medicine

## 2023-09-07 VITALS — Ht 60.0 in | Wt 110.0 lb

## 2023-09-07 DIAGNOSIS — Z96643 Presence of artificial hip joint, bilateral: Secondary | ICD-10-CM

## 2023-09-07 DIAGNOSIS — S73004A Unspecified dislocation of right hip, initial encounter: Secondary | ICD-10-CM

## 2023-09-07 DIAGNOSIS — F32A Depression: Secondary | ICD-10-CM

## 2023-09-07 DIAGNOSIS — Z96642 Presence of left artificial hip joint: Secondary | ICD-10-CM

## 2023-09-07 DIAGNOSIS — K635 Polyp of colon: Secondary | ICD-10-CM

## 2023-09-07 DIAGNOSIS — Z8781 Personal history of (healed) traumatic fracture: Secondary | ICD-10-CM

## 2023-09-07 DIAGNOSIS — K5900 Constipation, unspecified: Secondary | ICD-10-CM

## 2023-09-07 DIAGNOSIS — I499 Cardiac arrhythmia, unspecified: Secondary | ICD-10-CM

## 2023-09-07 DIAGNOSIS — E785 Hyperlipidemia, unspecified: Secondary | ICD-10-CM

## 2023-09-07 DIAGNOSIS — Z96641 Presence of right artificial hip joint: Secondary | ICD-10-CM

## 2023-09-07 NOTE — Progress Notes
 ORTHOPAEDICS & REHABILITATION Division of Adult ReconstructionHip & Knee Arthritis, Total Joint Replacement, and Revision [email or web address] Name:	Holly Erickson Number:	[id number] Date of Birth:	04-06-1942Date of Visit:	09/07/2023  Ms. Faulks is currently 82 y.o.She is status post:07/30/23 right total hip replacement for femoral neck fracture, Dr. Rubin10/7/24 closed reduction of posterior dislocation of right prosthetic hip   Review of Systems:I have reviewed the ROS, recorded by  the medical assistant at today's visit.Interval History: The patient is now 6 weeks postop from the recent hip arthroplasty as above.She presented to the ED on 08/13/23 after a fall and was found to have a posterior hip dislocation, which was reduced successfully in the ED and she was then placed in a knee immobilizer. She was discharged home to a STR on 08/16/23. Anticoagulation was temporarily held in the hospital due to question of possible ICH, which was then resumed after MRI was negative for bleed. She is scheduled to complete eliquis 2.5 mg BID on 09/03/23. She reports that her pain has significantly improved, only taking occasional Tylenol as needed.  She continues to wear a knee immobilizer and uses a Rollator or walker to ambulate.  She was recently discharged from short-term rehab and had her home PT intake appointment so far.  Medication List:Current Outpatient Medications:   ascorbic acid (VITAMIN C ORAL), Take by mouth daily., Disp: , Rfl:   bisacodyL (DULCOLAX) 10 mg suppository, Place 1 suppository (10 mg total) rectally daily as needed for constipation., Disp: 12 suppository, Rfl:   buPROPion SR (WELLBUTRIN SR) 100 mg 12 hr tablet, 1 tab(s) orally once a day for 30 days, Disp: , Rfl:   camphor-menthoL (SARNA) 0.5-0.5 % lotion, Apply topically as needed for itching. Keep in fridge., Disp: 222 mL, Rfl: 2  cholecalciferol, vitamin D3, 10 mcg (400 unit) tablet, Take 2 tablets (800 Units total) by mouth daily., Disp: 30 tablet, Rfl: 2  DULoxetine (CYMBALTA) 60 MG capsule, Take 1 capsule (60 mg total) by mouth daily., Disp: , Rfl:   enzymes,digestive (DIGESTIVE ENZYMES ORAL), Take by mouth daily., Disp: , Rfl:   gabapentin (NEURONTIN) 100 mg capsule, Take 1 capsule (100 mg total) by mouth 2 (two) times daily with breakfast and dinner., Disp: , Rfl:   melatonin 5 mg tablet, Take 1 tablet (5 mg total) by mouth nightly., Disp: , Rfl:   polyethylene glycol (MIRALAX) 17 gram packet, Take 1 packet (17 g total) by mouth daily. Mix in 8 ounces of water, juice, soda, coffee or tea prior to taking., Disp: 14 each, Rfl: 2  rosuvastatin (CRESTOR) 40 mg tablet, Take 1 tablet (40 mg total) by mouth daily., Disp: , Rfl:   zinc gluconate 50 mg tablet, Take 1 tablet (50 mg total) by mouth daily., Disp: , Rfl: Physical Exam: AAOX3, accompanied by a family member for the visit today. Able to stand and walk in the exam room, but has a Rollator with them today. Incision inspected, and intact without erythema or drainage. Surgical wound is healing well along its length without evidence of dehiscence.Imaging:  X-rays today demonstrating intact hardware, well-positioned without evidence of loosening or periprosthetic fracture. XR HIP RIGHT AP AND LATERAL Clinical Indication: Closed dislocation of right hip after right THR Comparison: August 13, 2023  1752. Findings: AP pelvis and crosstable lateral view of the right hip. There are degenerative and postsurgical changes of the lower lumbar spine which are incompletely characterized on this study. There is subchondral sclerosis at the right greater than left sacroiliac joint. 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 The hardware appears  similar to the prior study, intact, and without appreciable periprosthetic fracture. 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 The bones appear demineralized.  Impression: Bilateral total hip arthroplasty.  Reported and signed by: Juliann Pulse, MD  [facility] Radiology and Biomedical ImagingAssessment: Doing well, 6 weeks s/p recent hip arthroplasty and nearly 1 month s/p posterior dislocation with closed reduction.  ICD-10-CM  1. S/P total right hip arthroplasty  Z96.641   2. S/P right hip fracture  Z87.81   3. Closed dislocation of right hip, initial encounter [facility] Code)  S73.004A   Plan: -- Wound Care:  Okay to leave open to air. OK to shower but no tub bathing, swimming pool, or hot tub for 2 more weeks until wound is fully dry and sealed without any sign of infection.-- Therapy: Continue PT, may transition to outpatient PT. - Hip AROM and AAROM, Active Abduction, ADL's, Progressive ambulation. - Avoid vigorous SLR exercises, heavy lifting, and extreme activities during early recovery period.- No ROM Restrictions, ok for all ADL's.Per Dr. Donnie Coffin, patient may remove knee immobilizer and must continue using walker at all times when ambulating for fall prevention. We also reviewed hip precautions today.  Patient was advised to avoid sitting in deep chairs, continue using raised toilet seat and avoid extremes of range of motion.  Patient should continue to work with physical therapy on balance and fall prevention in addition to strengthening. -- Pain Control: -  ok for Tylenol up to 1000mg  3 x a day.-- DVT ppx:  Patient completed in 5 weeks of Eliquis.  -- Follow-Up:  in 6 weeks with Dr. Donnie Coffin with XR (standing AP Pelvis and X-Table lateral of operative hip) for continued evaluation, guidance, and care. The patient may call with any questions or problems. Vella Redhead PA-CYale [facility] [id number] [facility], [phone]  	[phone] 		Main Office & Appointments	[phone]		Fax Line	[phone]) [phone])	Assistant Direct LineModal DictationThis chart was created using M-Modal dictation software, and I have personally reviewed the dictation for accuracy. However, errors may still be present when words are incorrectly transcribed due to limitations of the software. cc:  Patient Care Mariea Stable, MD as PCP - General (Internal Medicine)

## 2023-09-10 ENCOUNTER — Encounter: Admit: 2023-09-10 | Payer: PRIVATE HEALTH INSURANCE | Attending: Emergency Medicine | Primary: Internal Medicine

## 2023-09-10 ENCOUNTER — Telehealth: Admit: 2023-09-10 | Payer: PRIVATE HEALTH INSURANCE | Attending: Orthopedic Surgery | Primary: Internal Medicine

## 2023-09-10 DIAGNOSIS — Z8781 Personal history of (healed) traumatic fracture: Secondary | ICD-10-CM

## 2023-09-10 DIAGNOSIS — S73004A Unspecified dislocation of right hip, initial encounter: Secondary | ICD-10-CM

## 2023-09-10 DIAGNOSIS — Z96641 Presence of right artificial hip joint: Secondary | ICD-10-CM

## 2023-09-10 NOTE — Telephone Encounter
 Copied from CRM #[id number]. Topic: Nursing Triage Note - [facility]>> Sep 10, 2023  9:49 AM Oralia Rud wrote:[facility]:  NURSE TRIAGE MESSAGETime of call:   9:49 AMCaller:   Rikki Spearing with patientShe is asking for her PT referral please be sent to [facility] care at [facility] the last office note Patient should continue to work with physical therapy on balance and fall prevention in addition to strengthening. Nicholes Calamity, [facility] CARE Center Nurse Advisor

## 2023-09-11 ENCOUNTER — Encounter: Admit: 2023-09-11 | Payer: PRIVATE HEALTH INSURANCE | Attending: Vascular and Interventional Radiology

## 2023-09-11 NOTE — Telephone Encounter
 Returned call. Discussed anterior hip precautions and plan of care from 11/1 office visit. Addressed all questions and concerns.

## 2023-09-11 NOTE — Telephone Encounter
 [facility] CENTER MESSAGETime of call:   10:10 AMCaller:   Gaida, LYNNCaller's relationship to patient:  Self  Calling from (pharmacy, hospital, agency, etc.):  n/a   Reason for call:   Pt is calling in asking for hip precautions and is asking to be reached back at [phone] telephone number for callback:   	[phone] Best time to return call:   Anytime Permission to leave message:  yes   [facility] Scheduler

## 2023-09-18 ENCOUNTER — Encounter: Admit: 2023-09-18 | Payer: PRIVATE HEALTH INSURANCE | Attending: Orthopedic Surgery | Primary: Internal Medicine

## 2023-09-18 DIAGNOSIS — Z96642 Presence of left artificial hip joint: Secondary | ICD-10-CM

## 2023-09-18 DIAGNOSIS — S73004A Unspecified dislocation of right hip, initial encounter: Secondary | ICD-10-CM

## 2023-09-24 ENCOUNTER — Encounter: Admit: 2023-09-24 | Payer: PRIVATE HEALTH INSURANCE | Attending: Orthopedic Surgery | Primary: Internal Medicine

## 2023-10-02 ENCOUNTER — Telehealth: Admit: 2023-10-02 | Payer: PRIVATE HEALTH INSURANCE | Attending: Orthopedic Surgery | Primary: Internal Medicine

## 2023-10-02 NOTE — Telephone Encounter
 Spoke to patient she is having pinns and needles feeling on the outside of her RTHA.  She denies any fever, sweats or chills or any recent trauma.  Advised patient to try ice/heat otc's  she will call back with any concerns in a few days if no improvement.

## 2023-10-02 NOTE — Telephone Encounter
 Status post R hip replacement with Dr Seward Carol in Oct 2024, pt calling in with concerns of strange feeling of sand paper rubbing.She states it has not gone away, asking for a call back from [facility] [phone]  best call back number

## 2023-10-08 ENCOUNTER — Telehealth: Admit: 2023-10-08 | Payer: PRIVATE HEALTH INSURANCE | Attending: Orthopedic Surgery | Primary: Internal Medicine

## 2023-10-08 NOTE — Telephone Encounter
 Copied from CRM #[id number]. Topic: General Inquiry - General Inquiry>> Oct 08, 2023  2:47 PM Rozann Lesches wrote:[facility] called regarding looking to see who will be signing home care orders [phone]

## 2023-10-10 ENCOUNTER — Encounter: Admit: 2023-10-10 | Payer: PRIVATE HEALTH INSURANCE | Attending: Orthopedic Surgery | Primary: Internal Medicine

## 2023-10-19 ENCOUNTER — Encounter: Admit: 2023-10-19 | Payer: PRIVATE HEALTH INSURANCE | Attending: Orthopedic Surgery | Primary: Internal Medicine

## 2023-10-23 ENCOUNTER — Encounter: Admit: 2023-10-23 | Payer: PRIVATE HEALTH INSURANCE | Attending: Orthopedic Surgery | Primary: Internal Medicine

## 2023-10-23 ENCOUNTER — Inpatient Hospital Stay: Admit: 2023-10-23 | Discharge: 2023-10-23 | Payer: PRIVATE HEALTH INSURANCE | Primary: Internal Medicine

## 2023-10-23 VITALS — Ht 60.0 in

## 2023-10-23 DIAGNOSIS — I499 Cardiac arrhythmia, unspecified: Secondary | ICD-10-CM

## 2023-10-23 DIAGNOSIS — Z96641 Presence of right artificial hip joint: Secondary | ICD-10-CM

## 2023-10-23 DIAGNOSIS — F32A Depression: Secondary | ICD-10-CM

## 2023-10-23 DIAGNOSIS — K5900 Constipation, unspecified: Secondary | ICD-10-CM

## 2023-10-23 DIAGNOSIS — Z96642 Presence of left artificial hip joint: Secondary | ICD-10-CM

## 2023-10-23 DIAGNOSIS — K635 Polyp of colon: Secondary | ICD-10-CM

## 2023-10-23 DIAGNOSIS — Z96653 Presence of artificial knee joint, bilateral: Secondary | ICD-10-CM

## 2023-10-23 DIAGNOSIS — E785 Hyperlipidemia, unspecified: Secondary | ICD-10-CM

## 2023-10-23 DIAGNOSIS — S73004A Unspecified dislocation of right hip, initial encounter: Secondary | ICD-10-CM

## 2023-10-23 DIAGNOSIS — T84020D Dislocation of internal right hip prosthesis, subsequent encounter: Secondary | ICD-10-CM

## 2023-10-23 NOTE — Progress Notes
 Review of Systems Musculoskeletal:       Follow-up: 07/30/23 right total hip replacement for femoral neck fracture10/7/24 closed reduction of posterior dislocation of right prosthetic hip   Pain: 0/10 pain sometimes in the groin

## 2023-10-23 NOTE — Patient Instructions
 ORTHOPAEDICS & REHABILITATIONLee Freddie Breech, M.D.Associate Professor of Orthopaedic SurgeryChief, Division of Adult ReconstructionHip & Knee Arthritis, Total Joint Replacement, & Revision Surgery Amy Haydon-Ryan, PA-CMeghan Apex, PA-C Donato Schultz, [email or web address]: [phone]  	Fax: [phone]	Dr. Renelda Loma Surgical Booking Assistant:  Dorene Grebe : [phone] (HIPS)(Please allow 3 business days to receive a call before calling the Surgical Scheduler)[facility] Diagnostic Radiology Scheduling:	 [phone]. Rubin's Clinical Office Locations:Tuesday AM: [employer] [employer], [address], [address], [place], [place] [address]NGEXBMWU AM & PM: [place], [place]: [address] [place], [place]  [address]W[facility] Mailing Address: [address], [place], [place] [address]ZDG Patient - Focused Information about Orthopaedic Surgery Procedures:[email or web address]     PLEASE sign up for Epic MyChart if you have not already done so. This is a critical step that allows [place] to communicate with you and send electronic prescriptions. You will also be able to view notes, lab / test results, and send secure messages to your clinical care team. You can complete the sign-up process in 3 three easy steps:1) Visit the [facility] MyChart website at [email or web address] and click on the ?Sign Up Now? button. 2) At the next screen, select ?Request Access Code? and complete the enrollment screens.3) Complete the [employer] identify verification process.For COVID TESTING AND VACCINE INFORMATION:[phone])

## 2023-10-23 NOTE — Progress Notes
 Department of Orthopaedics & Rehabilitation  Division of Hip & Knee Joint [email or web address] Name:	Holly Erickson Number:	[id number] Date of Birth:	January 31, 1942Date of Visit:	10/23/2023  [name] is currently 82 y.o.She was last seen on 09/07/2023, Vella Redhead, PA, 6 weeks postopShe is status post:07/30/23 right total hip replacement for femoral neck fracture, Dr. Rubin10/7/24 closed reduction of posterior dislocation of right prosthetic hip   Review of Systems:I have reviewed the ROS, recorded by  the medical assistant at today's visit Interval History: The patient is now 12 weeks and 1 day postop from the recent right hip arthroplasty as above, and is doing well overall. Pain is negligible, without need for ongoing narcotics. The incision remains well healed and dry. She states that she is using a Rolator and a cane and she feels more secure with the Rolator. She had a dislocation event around 2 weeks postop, was treated with closed reduction, and has been stable ever since without incident.She states that she is going on a trip to visit her 3 brothers in [place]. She would like to use the hot tub while there, she is worried that she will sink if she swims due to her implants.She is s/p TKA 30 years ago.Patient examined with:Neil Blinda Leatherwood MD PGY5Medication List:Current Outpatient Medications:   ascorbic acid (VITAMIN C ORAL), Take by mouth daily., Disp: , Rfl:   bisacodyL (DULCOLAX) 10 mg suppository, Place 1 suppository (10 mg total) rectally daily as needed for constipation., Disp: 12 suppository, Rfl:   buPROPion SR (WELLBUTRIN SR) 100 mg 12 hr tablet, 1 tab(s) orally once a day for 30 days, Disp: , Rfl:   camphor-menthoL (SARNA) 0.5-0.5 % lotion, Apply topically as needed for itching. Keep in fridge., Disp: 222 mL, Rfl: 2  cholecalciferol, vitamin D3, 10 mcg (400 unit) tablet, Take 2 tablets (800 Units total) by mouth daily., Disp: 30 tablet, Rfl: 2  DULoxetine (CYMBALTA) 60 MG capsule, Take 1 capsule (60 mg total) by mouth daily., Disp: , Rfl:   enzymes,digestive (DIGESTIVE ENZYMES ORAL), Take by mouth daily., Disp: , Rfl:   gabapentin (NEURONTIN) 100 mg capsule, Take 1 capsule (100 mg total) by mouth 2 (two) times daily with breakfast and dinner., Disp: , Rfl:   melatonin 5 mg tablet, Take 1 tablet (5 mg total) by mouth nightly., Disp: , Rfl:   rosuvastatin (CRESTOR) 40 mg tablet, Take 1 tablet (40 mg total) by mouth daily., Disp: , Rfl:   zinc gluconate 50 mg tablet, Take 1 tablet (50 mg total) by mouth daily., Disp: , Rfl:   polyethylene glycol (MIRALAX) 17 gram packet, Take 1 packet (17 g total) by mouth daily. Mix in 8 ounces of water, juice, soda, coffee or tea prior to taking. (Patient not taking: Reported on 10/23/2023), Disp: 14 each, Rfl: 2Physical Exam: AAOX3, Son is present for the visit today. Able to stand and walk in the exam room with an alternating stride and using a Rolator and a cane as assistive devices. Right hip ROM is from 90 to 130 of flexion, and at 90 the IR is 20 and ER is 80. No pain with passive FADIR or FABER. Able to reach the seated figure of four position without difficulty. Leg lengths are symmetric while seated, and the limb is intact to LT and Motor function distally. Imaging: XR Right Hip 10/23/2023:Formal reading below:XR Hip Right AP and LateralResult Date: 12/17/2024XR HIP RIGHT AP AND LATERAL Clinical Indication: Closed dislocation of right hip. S/P total right hip arthroplasty. Comparison: 09/07/2023 Findings: Redemonstration of right total hip arthroplasty  similar to prior study. No definite evidence for hardware loosening or periprosthetic fracture Similar appearing left total hip arthroplasty and lumbar spinal fusion hardware. Degenerative changes of the lower lumbar spine, SI joints and symphysis pubis. Sacrum obscured by bowel gas. Impression: Right total hip arthroplasty with intact hardware. [place] [facility] Notify System Classification: Routine. Reported and signed by: Gardiner Rhyme, MD  [facility] Radiology and Biomedical Imaging  PROM Data:   No data to display     Assessment: Doing well overall 12 weeks and 1 day postop R THA. The incision is healing well.With respect to the right hip, the implants are well aligned and well seated.After discussion with the patient, I recommend that the patient return to her daily activities as tolerated. She can bend to put her shoes and socks on with no restrictions. She can use the hot tub while visiting family, but she should use caution to avoid slipping and falling while walking on the tile. She should continue to use the Rolator for balance.She will return in 07/2024 for the 1 year postop visit, sooner if needed.Diagnosis Coding:  ICD-10-CM  1. S/P total right hip arthroplasty  Z96.641 XR Hip Right AP and Lateral  2. Closed dislocation of right hip, initial encounter (HC Code)  S73.004A XR Hip Right AP and Lateral  3. Status post bilateral knee replacements  Z96.653   4. Status post left hip replacement  Z96.642   Plan: -- Therapy: Continue Daily HEP. 	- No ROM Restriction for Routine Activities. Avoid Extremes of Leg Motion.	- Encourage Progressive Ambulation, Low Impact Exercises.	- Maintain a Healthy body weight.-- Pain Control: 	- No further narcotics at this time. 	- OK for OTC NSAID or Tylenol occasionally as needed.-- Return To Work: N/AOccupational History  Not on file -- Follow-Up:-- Planned at 12 months postop for clinical visit with ongoing discussion and guidance. -- XR should be obtained upon arrival, including Standing AP Pelvis, AP, and Cross Table Lateral of this right hip.-- XR of both knees should also be obtained upon return.-- The patient may call or e-mail with any questions or problems in the interim.Arlana Lindau, M.D., Pola Corn, FAOABoard Certified Diplomate of the American Board of Orthopaedic SurgeryAssociate Professor of Orthopaedic Surgery, [facility]	Chief, Tourist information centre manager Adult Reconstruction (Total Joint) ServiceAttending Orthopaedic Surgeon, [facility]	Chief, [facility] Total Joint Replacement ProgramYale Orthopa[facility] and [facility]	[address]	[place]	[place], [place]  [address] 	[phone] 		Main Office & Appointments	[phone]		Fax Line	[phone]) [phone])	Assistant Direct LineBilling & Compliance: On the day of this patient's encounter, a total of 15 minutes was personally spent by me.  This time included preparation with record and imaging review, obtaining and reviewing patient history, performing a medically appropriate examination, counseling the patient, family, and/or caregiver, ordering medications, tests, or procedures, referring to and communicating with other healthcare professionals, and documenting all of the above clinical information into the medical record. This does not include any resident/fellow teaching time, or any time spent performing a procedural service. Virtual Scribe Attestation(s):Scribed for Arlana Lindau, MD by Clemencia Course, medical scribe December 17, 2024The documentation recorded by the scribe accurately reflects the services I personally performed and the decisions made by me. I reviewed and confirmed all material entered and/or pre-charted by the scribe.

## 2023-10-25 ENCOUNTER — Encounter: Admit: 2023-10-25 | Payer: PRIVATE HEALTH INSURANCE | Attending: Orthopedic Surgery | Primary: Internal Medicine

## 2023-10-26 ENCOUNTER — Telehealth: Admit: 2023-10-26 | Payer: PRIVATE HEALTH INSURANCE | Attending: Orthopedic Surgery | Primary: Internal Medicine

## 2023-10-26 NOTE — Telephone Encounter
 [facility] CARE CENTER MESSAGETime of call:   1:01 PMCaller:   Stacy - [facility] Caller's relationship to patient:  n/a  Calling from [employer], hospital, agency, etc.):  n/a   Reason for call:   Requesting signature from provider on the last page and needs legible date.  Please fax. Thank you.Fax: [phone] Scheduler

## 2023-12-14 ENCOUNTER — Encounter: Admit: 2023-12-14 | Payer: PRIVATE HEALTH INSURANCE | Attending: Orthopedic Surgery | Primary: Internal Medicine

## 2023-12-17 ENCOUNTER — Telehealth: Admit: 2023-12-17 | Payer: PRIVATE HEALTH INSURANCE | Attending: Orthopedic Surgery | Primary: Internal Medicine

## 2023-12-17 NOTE — Telephone Encounter
 PC placed to patient son to gather additional information regarding FMLA form.

## 2023-12-17 NOTE — Telephone Encounter
 Received call from Isabella Stalling of Patient returning the Office call. He will be waiting for the returned call.Jonelle Sidle [facility] Nurse Advisor

## 2023-12-18 NOTE — Telephone Encounter
 FMLA forms completed,and emailed for review by team and to obtain signature, and fax form to appropriate party.

## 2023-12-21 ENCOUNTER — Encounter: Admit: 2023-12-21 | Payer: PRIVATE HEALTH INSURANCE | Attending: Orthopedic Surgery | Primary: Internal Medicine

## 2023-12-26 ENCOUNTER — Telehealth: Admit: 2023-12-26 | Payer: PRIVATE HEALTH INSURANCE | Attending: Orthopedic Surgery | Primary: Internal Medicine

## 2023-12-26 NOTE — Telephone Encounter
 Son Tawanna Cooler, calling again re: FMLA paperwork.

## 2023-12-26 NOTE — Telephone Encounter
 [facility]ARE CENTER MESSAGETime of call:   9:45 AMCaller:   PALMA,TODD Caller's relationship to patient:  Son  Radiographer, therapeutic from [employer], hospital, agency, etc.):  n/a   Reason for call:   Son calling regarging Certificate of Care form to [facility]. Please call Todd. Thank you.Best telephone number for callback:    [phone] [facility] Scheduler

## 2023-12-26 NOTE — Telephone Encounter
 Forms/PaperworkReconDoctor Requested: Dr. Marquita Palms Caller: Tawanna Cooler PalmaPhone: [phone] PatientPatient: Kipp Laurence: 21-Mar-1942___I Sent Over The Certificate Of Care And I Have Been Jumping Around Trying To Fins The Correct Person. I Was Told It Was Getting Done And Now Tomorrow Is The Deadline For [employer]. If Bliss Could Call Me.

## 2023-12-27 ENCOUNTER — Telehealth: Admit: 2023-12-27 | Payer: PRIVATE HEALTH INSURANCE | Attending: Orthopedic Surgery | Primary: Internal Medicine

## 2023-12-27 NOTE — Telephone Encounter
 Copied from CRM #[id number].>> Dec 27, 2023  2:18 PM Karie Kirks wrote:Erica from [facility] is  calling in regards to a outstanding order for 09/05/23 ending in 6626. This needs to be signed and faxed to [phone]. She states that she will re-fax the order today.  Jamesetta Orleans can be reached at [phone]

## 2023-12-28 ENCOUNTER — Telehealth: Admit: 2023-12-28 | Payer: PRIVATE HEALTH INSURANCE | Attending: Orthopedic Surgery | Primary: Internal Medicine

## 2023-12-28 NOTE — Telephone Encounter
 Spoke to [employer] and emailed his ppw.

## 2023-12-28 NOTE — Telephone Encounter
 Forms/PaperworkReconDoctor Requested: Dr. Marquita Palms Caller: Tawanna Cooler PalmaPhone: [phone] PatientPatient: Holly Erickson: 03-06-1942___I Need To Speak With Holly Erickson Jesus Re Some [employer] Forms

## 2024-01-10 ENCOUNTER — Encounter: Admit: 2024-01-10 | Payer: PRIVATE HEALTH INSURANCE | Attending: Orthopedic Surgery | Primary: Internal Medicine

## 2024-02-28 ENCOUNTER — Encounter
Admit: 2024-02-28 | Payer: Medicare (Managed Care) | Attending: Student in an Organized Health Care Education/Training Program | Primary: Internal Medicine

## 2024-02-28 DIAGNOSIS — L239 Allergic contact dermatitis, unspecified cause: Secondary | ICD-10-CM

## 2024-02-28 MED ORDER — TRIAMCINOLONE ACETONIDE 0.1 % TOPICAL OINTMENT
0.1 | Freq: Two times a day (BID) | TOPICAL | 3 refills | 15.00000 days | Status: SS
Start: 2024-02-28 — End: ?

## 2024-02-28 NOTE — Patient Instructions
 Start Triamcinolone ointment for twice a day for 2 weeks to arms, legs, and back for itchiness then decrease to 2-3 times a week as neededRecommend starting Zyrtec oral medication over the counter for itching dailyGentle Skin Care:1. Shower in warm water (not hot), use Dove soap 2. Apply moisturizing cream to arms, legs and other dry skin immediately after showering and to hands after washing.. 3. Apply menthol -containing lotion (e.g. Sarna, Gold Bond) for fast relief when you feel itchy- You can buy [employer], Cerave, Neutrogena, Aveeno or Cetaphil moisturizing cream, Sarna lotion, Chlorhexadine (Hibiclens), Benadryl  and Zyrtec without prescription at any [facility] or [facility]

## 2024-02-28 NOTE — Progress Notes
 CHIEF COMPLAINT:  PruritusSUBJECTIVE: Holly Erickson is 83 y.o. female new patient who presents for evaluation of pruritus. Last seen 04/09/23 by Dr. Marisa Sickles, here for the above. No changes in health since last being seen .History of Present IllnessThe patient presents with a new rash that started a week ago. The rash is described as 'little red things' that appear on the arms and the back of the hands, and more recently, the tops of the feet. The rash is intensely itchy, worsening as the day progresses, and temporarily relieved by Sarna lotion. The patient denies any changes in activities, travel, or pets. She has been using [employer] for laundry and [employer] for dishwashing for a long time. She also uses various bar soaps and recently started using an oil moisturizer purchased from a [place]  . The patient denies any other symptoms.Prior skin cancer history: NoFamily history of skin cancer: NoPrior derm history: pruritus, seborrheic dermatitis, lentigines, solar purpura, multiple benign nevi, cherry angiomas PMH: Past Medical History: Diagnosis Date  Arrhythmia   Colon polyp   Constipation   Depression   Hyperlipidemia  Current Outpatient Medications Medication Sig Dispense Refill  ascorbic acid (VITAMIN C ORAL) Take by mouth daily.    bisacodyL  (DULCOLAX) 10 mg suppository Place 1 suppository (10 mg total) rectally daily as needed for constipation. 12 suppository   buPROPion  SR (WELLBUTRIN  SR) 100 mg 12 hr tablet 1 tab(s) orally once a day for 30 days    camphor-menthoL  (SARNA) 0.5-0.5 % lotion Apply topically as needed for itching. Keep in fridge. 222 mL 2  cholecalciferol , vitamin D3, 10 mcg (400 unit) tablet Take 2 tablets (800 Units total) by mouth daily. 30 tablet 2  DULoxetine  (CYMBALTA ) 60 MG capsule Take 1 capsule (60 mg total) by mouth daily.    enzymes,digestive (DIGESTIVE ENZYMES ORAL) Take by mouth daily.    gabapentin  (NEURONTIN ) 100 mg capsule Take 1 capsule (100 mg total) by mouth 2 (two) times daily with breakfast and dinner.    melatonin 5 mg tablet Take 1 tablet (5 mg total) by mouth nightly.    polyethylene glycol (MIRALAX ) 17 gram packet Take 1 packet (17 g total) by mouth daily. Mix in 8 ounces of water, juice, soda, coffee or tea prior to taking. 14 each 2  rosuvastatin  (CRESTOR ) 40 mg tablet Take 1 tablet (40 mg total) by mouth daily.    zinc  gluconate 50 mg tablet Take 1 tablet (50 mg total) by mouth daily.    triamcinolone (KENALOG) 0.1 % ointment Apply topically 2 (two) times daily. To arms, legs, and back for 2 weeks then decrease to 2-3 times a week as needed for itching 453.6 g 2 No current facility-administered medications for this visit. ROS:  Denies any other complaints like fever, chills, night sweats, weight loss The patient's medications, allergies, problem list, and past medical, surgical and family histories were reviewed and updated as appropriate.OBJECTIVE:Physical Examination-Neuro/Psych: alert, NAD, appropriate mood & affect-Constitutional: well-groomed individual The following were inspected and/or palpated:-HEENT: including mouth (lips, teeth, oral mucosa), eyes (conjunctivae & lids)-Extremities: including digits & nails-Hair: including scalp, eyebrows, face, chest, extremities-Skin: head (including face, scalp), neck, RUE, LUE, chest (including breasts & axillae), abdomen, back, RLE, LLEThese areas were significant for the following:Multiple erythematous macules few excoriated papules on the arms and legsPatient exam or treatment required medical chaperone.The sensitive parts of the examination were performed with chaperone present: Yes; Chaperone Name, Role/Title: Amon Bali   Assessment/PlanDermal hypersensitivity reaction- On Arms and legs  -  Acute, uncomplicated illness or injury, not at goal , moderate- Etiology and prognosis discussed; ddx contact dermatitis due to oil or [place] visit recently - START: - Start triamcinolone ointment BID for two weeks and then decrease to 2-3 times a week. - Gentle skin care discussed, handout given RTC: 1 months dermal hypersensitivity reaction follow up Scribed for Margeret Sheer, MD by Audrie Blind, medical scribe 4/24/2025The documentation recorded by the scribe accurately reflects the services I personally performed and the decisions made by me. I reviewed and confirmed all material entered and/or pre-charted by the scribe.I provided a concise overview of the ambient note generation solution. Margaret Bravato or their legally authorized representative verbally consented to a temporary audio recording of their visit to assist with completing the visit documentation using an AI-powered solution. This note was reviewed for accuracy by Alys Bacca who performed the clinical service.

## 2024-03-03 ENCOUNTER — Inpatient Hospital Stay: Admit: 2024-03-03 | Discharge: 2024-03-03 | Payer: Medicare (Managed Care) | Attending: Emergency Medicine

## 2024-03-03 ENCOUNTER — Encounter: Admit: 2024-03-03 | Payer: PRIVATE HEALTH INSURANCE | Primary: Internal Medicine

## 2024-03-03 ENCOUNTER — Emergency Department: Admit: 2024-03-03 | Payer: Medicare (Managed Care) | Attending: Diagnostic Radiology | Primary: Internal Medicine

## 2024-03-03 DIAGNOSIS — F32A Depression: Secondary | ICD-10-CM

## 2024-03-03 DIAGNOSIS — R591 Generalized enlarged lymph nodes: Secondary | ICD-10-CM

## 2024-03-03 DIAGNOSIS — K635 Polyp of colon: Secondary | ICD-10-CM

## 2024-03-03 DIAGNOSIS — Z888 Allergy status to other drugs, medicaments and biological substances status: Secondary | ICD-10-CM

## 2024-03-03 DIAGNOSIS — J029 Acute pharyngitis, unspecified: Secondary | ICD-10-CM

## 2024-03-03 DIAGNOSIS — K5900 Constipation, unspecified: Secondary | ICD-10-CM

## 2024-03-03 DIAGNOSIS — I499 Cardiac arrhythmia, unspecified: Secondary | ICD-10-CM

## 2024-03-03 DIAGNOSIS — Z886 Allergy status to analgesic agent status: Secondary | ICD-10-CM

## 2024-03-03 DIAGNOSIS — Z20822 Contact with and (suspected) exposure to covid-19: Secondary | ICD-10-CM

## 2024-03-03 DIAGNOSIS — R1310 Dysphagia, unspecified: Secondary | ICD-10-CM

## 2024-03-03 DIAGNOSIS — R229 Localized swelling, mass and lump, unspecified: Secondary | ICD-10-CM

## 2024-03-03 DIAGNOSIS — E785 Hyperlipidemia, unspecified: Secondary | ICD-10-CM

## 2024-03-03 LAB — CBC WITH AUTO DIFFERENTIAL
BKR WAM ABSOLUTE IMMATURE GRANULOCYTES.: 0.09 x 1000/ÂµL (ref 0.00–0.30)
BKR WAM ABSOLUTE LYMPHOCYTE COUNT.: 1.17 x 1000/ÂµL (ref 0.60–3.70)
BKR WAM ABSOLUTE NRBC (2 DEC): 0 x 1000/ÂµL (ref 0.00–1.00)
BKR WAM ANC (ABSOLUTE NEUTROPHIL COUNT): 4.44 x 1000/ÂµL (ref 2.00–7.60)
BKR WAM BASOPHIL ABSOLUTE COUNT.: 0.06 x 1000/ÂµL (ref 0.00–1.00)
BKR WAM BASOPHILS: 0.9 % (ref 0.0–1.4)
BKR WAM EOSINOPHIL ABSOLUTE COUNT.: 0.17 x 1000/ÂµL (ref 0.00–1.00)
BKR WAM EOSINOPHILS: 2.6 % (ref 0.0–5.0)
BKR WAM HEMATOCRIT (2 DEC): 36.5 % (ref 35.00–45.00)
BKR WAM HEMOGLOBIN: 12.2 g/dL (ref 11.7–15.5)
BKR WAM IMMATURE GRANULOCYTES: 1.4 % — ABNORMAL HIGH (ref 0.0–1.0)
BKR WAM LYMPHOCYTES: 17.9 % (ref 17.0–50.0)
BKR WAM MCH (PG): 32.9 pg (ref 27.0–33.0)
BKR WAM MCHC: 33.4 g/dL (ref 31.0–36.0)
BKR WAM MCV: 98.4 fL (ref 80.0–100.0)
BKR WAM MONOCYTE ABSOLUTE COUNT.: 0.62 x 1000/ÂµL (ref 0.00–1.00)
BKR WAM MONOCYTES: 9.5 % (ref 4.0–12.0)
BKR WAM MPV: 10.9 fL (ref 8.0–12.0)
BKR WAM NEUTROPHILS: 67.7 % (ref 39.0–72.0)
BKR WAM NUCLEATED RED BLOOD CELLS: 0 % (ref 0.0–1.0)
BKR WAM PLATELETS: 161 x1000/ÂµL (ref 150–420)
BKR WAM RDW-CV: 13.6 % (ref 11.0–15.0)
BKR WAM RED BLOOD CELL COUNT.: 3.71 M/ÂµL — ABNORMAL LOW (ref 4.00–6.00)
BKR WAM WHITE BLOOD CELL COUNT: 6.6 x1000/ÂµL (ref 4.0–11.0)

## 2024-03-03 LAB — TSH W/REFLEX TO FT4     (BH GH LMW Q YH): BKR THYROID STIMULATING HORMONE: 1.42 u[IU]/mL

## 2024-03-03 LAB — GROUP A STREPTOCOCCUS BY PCR     (BH GH LMW YH): BKR GROUP A STREP PCR: NOT DETECTED

## 2024-03-03 LAB — BASIC METABOLIC PANEL
BKR ANION GAP: 8 (ref 7–17)
BKR BLOOD UREA NITROGEN: 19 mg/dL (ref 8–23)
BKR BUN / CREAT RATIO: 21.8 (ref 8.0–23.0)
BKR CALCIUM: 9.1 mg/dL (ref 8.8–10.2)
BKR CHLORIDE: 103 mmol/L (ref 98–107)
BKR CO2: 29 mmol/L (ref 20–30)
BKR CREATININE DELTA: 0.17
BKR CREATININE: 0.87 mg/dL (ref 0.40–1.30)
BKR EGFR, CREATININE (CKD-EPI 2021): >60 mL/min/{1.73_m2} (ref >=60)
BKR GLUCOSE: 91 mg/dL (ref 70–100)
BKR POTASSIUM: 4.6 mmol/L (ref 3.3–5.3)
BKR SODIUM: 140 mmol/L (ref 136–144)

## 2024-03-03 LAB — SARS-COV-2 (COVID-19)/INFLUENZA A+B/RSV BY RT-PCR (BH GH LMW YH)
BKR INFLUENZA A: NEGATIVE
BKR INFLUENZA B: NEGATIVE
BKR RESPIRATORY SYNCYTIAL VIRUS: NEGATIVE
BKR SARS-COV-2 RNA (COVID-19) (YH): NEGATIVE

## 2024-03-03 NOTE — Discharge Instructions
 Today, you were seen in the emergency department for the large bump on your neck.  After bedside evaluation, it appears that this is a lymph node.  It is possible that the lymph node enlarged in size in reaction to what caused your rash 4 days ago.  You tested negative for strep throat today, and the viral swab showed no evidence of the flu COVID or RSV.  There was no significant change in your blood work today in the emergency department.  A follow up appointment in the extended care center was made for you on May 5th at 5:30 p.m.Holly Erickson  Follow up at this time for repeat evaluation and to ensure that your lymph node is not significantly enlarging.  Also, follow up with your new primary care doctor as previously scheduled.  Return to the emergency department immediately if you develop significant swelling, difficulty swallowing and tolerating her secretions, you become short of breath or feel like your airways closing, or if your symptoms significantly worsen.

## 2024-03-03 NOTE — ED Provider Notes
 Chief Complaint Patient presents with  Sore Throat   arrives with left sided throat discomfort, tender to palpation and pain with swallowing x 2 days, palpable mass to left neck, denies fever/chills/cough/ sick contacts. HPI/PE:Patient is an 83 year old female with a past medical history including cerebellar hemorrhage, hyperlipidemia, and parathyroidectomy who presents to the emergency department with a 2 day history of left-sided tender lump in her neck.  States that she hit a rash 4 days ago that was itchy and red and was seen at the dermatologist at that time and was prescribed triamcinolone cream.  States that the rash has resolved since that time.  She denies cough, congestion, fevers or chills, recent travel, or sick contacts.  She denies sore throat but states the lump on her neck is tender when she chews.  States she has been able to drink fluids and tolerate her secretions without difficulty.  She denies chest pain, shortness of breath, nausea vomiting or abdominal pain.ZOX:[id number] diagnosis:  Reactive lymph node, consider lymphoma, lower concern for thyroid nodule, strep throat, viral URIDeterminants of care: PCP unavailable Dr. Sedonia Dad available for consultation Plan: CBC, BMP, TSH with reflex T4, strep Straub, viral swabAn acute or life threatening problem was considered during this evaluation  External data reviewed: Notes (OSH or non-ED)  Physical ExamED Triage Vitals [03/03/24 0920]BP: 129/80Pulse: 88Pulse from  O2 sat: n/aResp: 14Temp: 98 ?F (36.7 ?C)Temp src: OralSpO2: 97 % BP 128/77  - Pulse 77  - Temp 98.9 ?F (37.2 ?C) (Temporal)  - Resp 15  - SpO2 98% Physical ExamConstitutional:     General: She is not in acute distress.   Appearance: Normal appearance. HENT:    Head: Normocephalic and atraumatic.    Comments: Uvula midline, no exudate, mild erythema in the posterior aspect of the oropharynx with no significant swelling   Nose: Nose normal. Eyes:    General: No scleral icterus.Cardiovascular:    Rate and Rhythm: Normal rate and regular rhythm.    Heart sounds: No murmur heard.Pulmonary:    Effort: Pulmonary effort is normal. No respiratory distress.    Breath sounds: Normal breath sounds. No wheezing, rhonchi or rales. Abdominal:    General: Abdomen is flat. There is no distension.    Palpations: Abdomen is soft.    Tenderness: There is no abdominal tenderness. There is no guarding or rebound. Skin:   General: Skin is warm.    Capillary Refill: Capillary refill takes less than 2 seconds. Neurological:    Mental Status: She is alert and oriented to person, place, and time. Mental status is at baseline. Psychiatric:       Mood and Affect: Mood normal.       Thought Content: Thought content normal.       Judgment: Judgment normal.  ProceduresAttestation/Critical CarePatient Reevaluation: ED Attestation: PA/APRNFace to face evaluation was performed by me in collaboration with the Advanced Practice Provider to assess for significant health threats. I provided a substantive portion of the care of this patient.  I personally performed medically appropriate history and physical exam and the MDM: On my exam: left sided upper neck 2 cm mass, mobile, tender to palpation but no overlying erythema or warmth; erythema in oropharynx no tonsil swelling no assymetry, no exudatesMy differential includes: reactive lymph node, consider other neck mass plan for bedside US , which is consistent with lymph node, given 2 day time frame for appearance, mobile and tender low risk of malignancyAdditional acute and/or chronic problems addressed:Cristiana BaloescuComments as of 03/03/24 1802 Mon Mar 03, 2024 1129 Group A Streptococcus PCR: Group A Streptococcus Not Detected [TC] 1129 Group A Streptococcus PCR: Group A Streptococcus Not Detected [TC] 1137 Group A Streptococcus PCR: Group A Streptococcus Not Detected [TC] 1137 Group A streptococcus by PCR [TC] 1137 Group A Streptococcus PCR: Group A Streptococcus Not Detected [TC]  Comments User Index[TC] Deward Force, PA   Clinical Impressions as of 03/03/24 1802 Lymphadenopathy  ED DispositionDischarge  Baloescu, Jackson, MD04/28/25 1055 Deward Force, PA04/28/25 1802

## 2024-03-03 NOTE — ED Notes
 9:49 AM Pt reports left sided neck discomfort and sore throat, with increased pain while talking x 2 days with associated visual and palpable mass to left side of neck. Pt denies difficulty swallowing, pt able to speak in clear and complete sentences, able to manage secretions, no stridor or tongue swelling noted. Pt denies CP, SOB, abd pain, NVD, fevers/chills, weakness or paraesthesias. Student at bedside for eval.12:01 PM PT discharged by provider. VSS, in NAD, AAOx4. PT verbalized understanding of AVS and return to ED precaution teaching and denies further questions at this time. PT ambulated out of ED with steady gait.

## 2024-03-05 ENCOUNTER — Emergency Department: Admit: 2024-03-05 | Payer: Medicare (Managed Care) | Primary: Internal Medicine

## 2024-03-05 ENCOUNTER — Inpatient Hospital Stay: Admit: 2024-03-05 | Discharge: 2024-03-10 | Payer: Medicare (Managed Care)

## 2024-03-05 DIAGNOSIS — M9701XA Periprosthetic fracture around internal prosthetic right hip joint, initial encounter: Secondary | ICD-10-CM

## 2024-03-05 LAB — BASIC METABOLIC PANEL
BKR ANION GAP: 15 (ref 7–17)
BKR ANION GAP: 12 (ref 7–17)
BKR BLOOD UREA NITROGEN: 55 mg/dL — ABNORMAL HIGH (ref 8–23)
BKR BLOOD UREA NITROGEN: 53 mg/dL — ABNORMAL HIGH (ref 8–23)
BKR BUN / CREAT RATIO: 34.4 — ABNORMAL HIGH (ref 8.0–23.0)
BKR BUN / CREAT RATIO: 40.8 — ABNORMAL HIGH (ref 8.0–23.0)
BKR CALCIUM: 8.7 mg/dL — ABNORMAL LOW (ref 8.8–10.2)
BKR CALCIUM: 7.7 mg/dL — ABNORMAL LOW (ref 8.8–10.2)
BKR CHLORIDE: 99 mmol/L (ref 98–107)
BKR CHLORIDE: 105 mmol/L (ref 98–107)
BKR CO2: 23 mmol/L (ref 20–30)
BKR CO2: 21 mmol/L (ref 20–30)
BKR CREATININE DELTA: 0.73 — ABNORMAL HIGH
BKR CREATININE DELTA: -0.3
BKR CREATININE: 1.6 mg/dL — ABNORMAL HIGH (ref 0.40–1.30)
BKR CREATININE: 1.3 mg/dL (ref 0.40–1.30)
BKR EGFR, CREATININE (CKD-EPI 2021): 31 mL/min/{1.73_m2} — ABNORMAL LOW (ref >=60)
BKR EGFR, CREATININE (CKD-EPI 2021): 40 mL/min/{1.73_m2} — ABNORMAL LOW (ref >=60)
BKR GLUCOSE: 137 mg/dL — ABNORMAL HIGH (ref 70–100)
BKR GLUCOSE: 117 mg/dL — ABNORMAL HIGH (ref 70–100)
BKR POTASSIUM: 3.6 mmol/L (ref 3.3–5.3)
BKR POTASSIUM: 3.8 mmol/L (ref 3.3–5.3)
BKR SODIUM: 137 mmol/L (ref 136–144)
BKR SODIUM: 138 mmol/L (ref 136–144)

## 2024-03-05 LAB — CBC WITH AUTO DIFFERENTIAL
BKR WAM ABSOLUTE IMMATURE GRANULOCYTES.: 0.05 x 1000/ÂµL (ref 0.00–0.30)
BKR WAM ABSOLUTE LYMPHOCYTE COUNT.: 1.28 x 1000/ÂµL (ref 0.60–3.70)
BKR WAM ABSOLUTE NRBC (2 DEC): 0 x 1000/ÂµL (ref 0.00–1.00)
BKR WAM ANC (ABSOLUTE NEUTROPHIL COUNT): 11.39 x 1000/ÂµL — ABNORMAL HIGH (ref 2.00–7.60)
BKR WAM BASOPHIL ABSOLUTE COUNT.: 0.03 x 1000/ÂµL (ref 0.00–1.00)
BKR WAM BASOPHILS: 0.2 % (ref 0.0–1.4)
BKR WAM EOSINOPHIL ABSOLUTE COUNT.: 0 x 1000/ÂµL (ref 0.00–1.00)
BKR WAM EOSINOPHILS: 0 % (ref 0.0–5.0)
BKR WAM HEMATOCRIT (2 DEC): 27.4 % — ABNORMAL LOW (ref 35.00–45.00)
BKR WAM HEMOGLOBIN: 9.1 g/dL — ABNORMAL LOW (ref 11.7–15.5)
BKR WAM IMMATURE GRANULOCYTES: 0.4 % (ref 0.0–1.0)
BKR WAM LYMPHOCYTES: 9 % — ABNORMAL LOW (ref 17.0–50.0)
BKR WAM MCH (PG): 32.2 pg (ref 27.0–33.0)
BKR WAM MCHC: 33.2 g/dL (ref 31.0–36.0)
BKR WAM MCV: 96.8 fL (ref 80.0–100.0)
BKR WAM MONOCYTE ABSOLUTE COUNT.: 1.42 x 1000/ÂµL — ABNORMAL HIGH (ref 0.00–1.00)
BKR WAM MONOCYTES: 10 % (ref 4.0–12.0)
BKR WAM MPV: 11.1 fL (ref 8.0–12.0)
BKR WAM NEUTROPHILS: 80.4 % — ABNORMAL HIGH (ref 39.0–72.0)
BKR WAM NUCLEATED RED BLOOD CELLS: 0 % (ref 0.0–1.0)
BKR WAM PLATELETS: 141 x1000/ÂµL — ABNORMAL LOW (ref 150–420)
BKR WAM RDW-CV: 13.7 % (ref 11.0–15.0)
BKR WAM RED BLOOD CELL COUNT.: 2.83 M/ÂµL — ABNORMAL LOW (ref 4.00–6.00)
BKR WAM WHITE BLOOD CELL COUNT: 14.2 x1000/ÂµL — ABNORMAL HIGH (ref 4.0–11.0)

## 2024-03-05 LAB — URINE MICROSCOPIC     (BH GH LMW YH)
BKR HYALINE CASTS, UA INSTRUMENT (NUMERIC): 4 /LPF — ABNORMAL HIGH (ref 0–3)
BKR RBC/HPF INSTRUMENT: 7 /HPF — ABNORMAL HIGH (ref 0–2)
BKR URINE SQUAMOUS EPITHELIAL CELLS, UA (NUMERIC): 19 /HPF — ABNORMAL HIGH (ref 0–5)
BKR WBC/HPF INSTRUMENT: 125 /HPF — ABNORMAL HIGH (ref 0–5)

## 2024-03-05 LAB — CK     (BH GH L LMW YH)
BKR CREATINE KINASE TOTAL: 5276 U/L — ABNORMAL HIGH (ref 26–192)
BKR CREATINE KINASE TOTAL: 3465 U/L — ABNORMAL HIGH (ref 26–192)

## 2024-03-05 LAB — PHOSPHORUS     (BH GH L LMW YH): BKR PHOSPHORUS: 2.8 mg/dL (ref 2.2–4.5)

## 2024-03-05 LAB — URINALYSIS WITH CULTURE REFLEX      (BH LMW YH)
BKR BILIRUBIN, UA: NEGATIVE
BKR GLUCOSE, UA: NEGATIVE
BKR KETONES, UA: NEGATIVE
BKR NITRITE, UA: NEGATIVE
BKR PH, UA: 5.5 (ref 5.5–7.5)
BKR SPECIFIC GRAVITY, UA: 1.02 (ref 1.005–1.030)
BKR UROBILINOGEN, UA (MG/DL): <2 mg/dL (ref <=2.0)

## 2024-03-05 LAB — UA REFLEX CULTURE

## 2024-03-05 LAB — PROTIME AND INR
BKR INR: 1.08 (ref 0.87–1.13)
BKR PROTHROMBIN TIME: 11.6 s (ref 9.5–12.1)

## 2024-03-05 LAB — MSSA / MRSA SCREEN BY PCR   (BH GH LMW YH)
BKR MRSA NARES PCR: NEGATIVE
BKR SAUR NARES PCR: NEGATIVE

## 2024-03-05 LAB — MAGNESIUM: BKR MAGNESIUM: 2.2 mg/dL (ref 1.7–2.4)

## 2024-03-05 LAB — URIC ACID: BKR URIC ACID: 6.8 mg/dL (ref 2.7–7.3)

## 2024-03-05 LAB — PARTIAL THROMBOPLASTIN TIME     (BH GH LMW Q YH): BKR PARTIAL THROMBOPLASTIN TIME: 32.2 s — ABNORMAL HIGH (ref 23.0–31.0)

## 2024-03-05 MED ORDER — SODIUM CHLORIDE 0.9 % (FLUSH) INJECTION SYRINGE
0.9 % | INTRAVENOUS | Status: DC | PRN
Start: 2024-03-05 — End: 2024-03-11

## 2024-03-05 MED ORDER — POLYETHYLENE GLYCOL 3350 17 GRAM ORAL POWDER PACKET
17 | Freq: Every day | ORAL | Status: DC
Start: 2024-03-05 — End: 2024-03-06

## 2024-03-05 MED ORDER — BUPROPION HCL XL 150 MG 24 HR TABLET, EXTENDED RELEASE
150 mg | Freq: Every day | ORAL | Status: DC
Start: 2024-03-05 — End: 2024-03-11
  Administered 2024-03-06 – 2024-03-10 (×4): 150 mg via ORAL

## 2024-03-05 MED ORDER — ONDANSETRON HCL (PF) 4 MG/2 ML INJECTION SOLUTION
4 mg/2 mL | Freq: Four times a day (QID) | INTRAVENOUS | Status: DC | PRN
Start: 2024-03-05 — End: 2024-03-11

## 2024-03-05 MED ORDER — DULOXETINE 60 MG CAPSULE,DELAYED RELEASE
60 mg | Freq: Every day | ORAL | Status: DC
Start: 2024-03-05 — End: 2024-03-11
  Administered 2024-03-06 – 2024-03-10 (×4): 60 mg via ORAL

## 2024-03-05 MED ORDER — OXYCODONE IMMEDIATE RELEASE 5 MG TABLET
5 | ORAL | Status: AC | PRN
Start: 2024-03-05 — End: ?

## 2024-03-05 MED ORDER — MORPHINE 2 MG/ML INJECTION SYRINGE
2 | INTRAVENOUS | Status: DC | PRN
Start: 2024-03-05 — End: 2024-03-06

## 2024-03-05 MED ORDER — HEPARIN (PORCINE) 5,000 UNIT/ML INJECTION SOLUTION
5000 | Freq: Three times a day (TID) | SUBCUTANEOUS | Status: DC
Start: 2024-03-05 — End: 2024-03-06
  Administered 2024-03-05 – 2024-03-06 (×3): 5000 mL via SUBCUTANEOUS

## 2024-03-05 MED ORDER — ONDANSETRON 4 MG DISINTEGRATING TABLET
4 mg | Freq: Four times a day (QID) | Status: DC | PRN
Start: 2024-03-05 — End: 2024-03-11

## 2024-03-05 MED ORDER — SENNOSIDES 8.6 MG TABLET
8.6 mg | Freq: Two times a day (BID) | ORAL | Status: DC
Start: 2024-03-05 — End: 2024-03-11
  Administered 2024-03-06 – 2024-03-10 (×7): 8.6 mg via ORAL

## 2024-03-05 MED ORDER — BISACODYL 5 MG TABLET,DELAYED RELEASE
5 mg | Freq: Every day | ORAL | Status: DC | PRN
Start: 2024-03-05 — End: 2024-03-11
  Administered 2024-03-10: 15:00:00 5 mg via ORAL

## 2024-03-05 MED ORDER — SODIUM CHLORIDE 0.9 % (FLUSH) INJECTION SYRINGE
0.9 % | Freq: Three times a day (TID) | INTRAVENOUS | Status: DC
Start: 2024-03-05 — End: 2024-03-11
  Administered 2024-03-06 – 2024-03-10 (×4): 0.9 mL via INTRAVENOUS

## 2024-03-05 MED ORDER — ROSUVASTATIN 40 MG TABLET
40 mg | Freq: Every day | ORAL | Status: DC
Start: 2024-03-05 — End: 2024-03-11
  Administered 2024-03-06 – 2024-03-10 (×4): 40 mg via ORAL

## 2024-03-05 MED ORDER — CEFTRIAXONE IV PUSH 1000 MG VIAL & NS (ADULTS)
INTRAVENOUS | Status: DC
Start: 2024-03-05 — End: 2024-03-09
  Administered 2024-03-05 – 2024-03-08 (×4): 10.000 mL via INTRAVENOUS

## 2024-03-05 MED ORDER — SENNOSIDES 8.6 MG TABLET
8.6 | Freq: Every evening | ORAL | Status: DC
Start: 2024-03-05 — End: 2024-03-06

## 2024-03-05 MED ORDER — BISACODYL 10 MG RECTAL SUPPOSITORY
10 mg | Freq: Every day | RECTAL | Status: DC | PRN
Start: 2024-03-05 — End: 2024-03-11

## 2024-03-05 MED ORDER — SODIUM CHLORIDE 0.9 % IV BOLUS NEW BAG (DROPS CHARGE)
0.9 | Freq: Once | INTRAVENOUS | Status: CP
Start: 2024-03-05 — End: ?
  Administered 2024-03-05: 10:00:00 0.9 mL/h via INTRAVENOUS

## 2024-03-05 MED ORDER — CHLORHEXIDINE GLUCONATE 2 % TOWELETTE
2 | Freq: Once | TOPICAL | Status: CP
Start: 2024-03-05 — End: ?
  Administered 2024-03-07: 09:00:00 2 % via TOPICAL

## 2024-03-05 MED ORDER — GABAPENTIN 100 MG CAPSULE
100 mg | Freq: Two times a day (BID) | ORAL | Status: DC
Start: 2024-03-05 — End: 2024-03-09
  Administered 2024-03-05 – 2024-03-08 (×6): 100 mg via ORAL

## 2024-03-05 MED ORDER — BISACODYL 5 MG TABLET,DELAYED RELEASE
5 | Freq: Every day | ORAL | Status: DC | PRN
Start: 2024-03-05 — End: 2024-03-06

## 2024-03-05 MED ORDER — POVIDONE-IODINE 5 % KIT
5 | Freq: Once | NASAL | Status: CP
Start: 2024-03-05 — End: ?
  Administered 2024-03-07: 09:00:00 5 % via NASAL

## 2024-03-05 MED ORDER — ACETAMINOPHEN 325 MG TABLET
325 mg | Freq: Four times a day (QID) | ORAL | Status: DC
Start: 2024-03-05 — End: 2024-03-11
  Administered 2024-03-05 – 2024-03-10 (×16): 325 mg via ORAL

## 2024-03-05 MED ORDER — CHLORHEXIDINE GLUCONATE 0.12 % MOUTHWASH
0.12 | Freq: Once | OROMUCOSAL | Status: DC
Start: 2024-03-05 — End: 2024-03-07

## 2024-03-05 MED ORDER — SODIUM CHLORIDE 0.9 % INTRAVENOUS SOLUTION
INTRAVENOUS | Status: DC
Start: 2024-03-05 — End: 2024-03-06
  Administered 2024-03-05 – 2024-03-06 (×3): via INTRAVENOUS

## 2024-03-05 MED ORDER — SODIUM CHLORIDE 0.9 % INTRAVENOUS SOLUTION
INTRAVENOUS | Status: DC
Start: 2024-03-05 — End: 2024-03-05
  Administered 2024-03-05: 18:00:00 via INTRAVENOUS

## 2024-03-05 MED ORDER — OXYCODONE (ROXICODONE) IMMEDIATE RELEASE 2.5 MG HALFTAB
2.5 | ORAL | Status: CP | PRN
Start: 2024-03-05 — End: ?
  Administered 2024-03-07 – 2024-03-08 (×3): 2.5 mg via ORAL

## 2024-03-05 MED ORDER — POLYETHYLENE GLYCOL 3350 17 GRAM ORAL POWDER PACKET
17 gram | Freq: Two times a day (BID) | ORAL | Status: DC
Start: 2024-03-05 — End: 2024-03-11
  Administered 2024-03-06 – 2024-03-10 (×6): 17 gram via ORAL

## 2024-03-05 NOTE — ED Notes
 12:58 PM Report provided to Kaitlyn, Charity fundraiser. Care assumed at this time. Pt presented to ED for R hip pain post fall, unknown downtime. Dx with UTI, AKI, and R hip prosthetic fracture.  Increased confusion post fall per family member at bedside. Pending [place] of hip and ortho consult.Chief Complaint Patient presents with  Hip Pain   Pt BIBA from home s/p fall yesterday around 4:30pm. Pt's son found her on the ground in the bathroom. Pt c/o R hip pain. +rotation. Pt unable to bear weight on R leg. Pt is not on blood thinners. Unknown how long pt was down before her son found her. VSS. 1:44 PMPt's son updated on plan of care, results. Concerned d/t pt living with self, requesting care management be involved if admitted for possible rehab 2:44 PMPt increasingly restless, agitated. Pt transferred to B7 to decrease lightening and stimuli. Son at bedside 2:59 PMOrtho at bedside 3:11 PMPt provided diet/hydration w/ ortho approval. Pt repositioned for comfort 4:00 PMPt transferred to floor Floor Handoff Telemetry: 	[]  Yes		[]  NoCode Status:   [x]  Full		[]  DNR		[]  DNI		Other (specify):Safety Precautions: []  Fall Risk  []  Sitter   []  Restraints	[]  Suicidal	[]  None	Other (specify):Mentation/Orientation:	 A&O (Self, person, place, time) x          	 Disoriented to:                    	 Special Accommodations: []  Hearing impaired   []  Blind  []  Nonverbal  []  Cognitive impairmentOxygenation Upon Admission: [x]  RA	[]  NC	[]  Venti  []  Simple Mask []  Other	Baseline O2 Status? [x]  Yes	[]  NoAmbulation: []  Independent	[x]  Cane   [x]  Walker	[]  Wheelchair	[]  Bedbound		[]  Hemiplegic	[]  Paraplegic	[]  QuadraplegicEliminiation: []  Independent	[]  Commode	[x]  Bedpan/Urinal  []  Straight Cath []  Foley cath			[]  Urostomy	[]  Colostomy	Other (specify):Diarrhea/Loose stool : []  1x within 24h  []  2x within 24h  []  3x within 24h  [x]  None 	C.Diff Order: 	[]  Ordered- needs to be collected             []  Collected-sent to lab             []  Resulted - Negative C.Diff             []  Resulted - Positive C.Diff[]  Not Ordered   [x]  N/ASkin Alteration: []  Pressure Injury []  Wound []  None [x]  Skin not assessedDiet: []  Regular/No order placed	[]  NPO		Other (specify): Unknown at this time, pending Ortho plan IV Access: [x]  PIV   []  PICC    []  Port    [] Central line    []  A-line    Other (specify)IVF/GTT Running Upon ED Departure? [x]  No	    []  Yes (specify):Outstanding Meds/Treatments/Tests:Patient Belongings: shirt Are the belongings documented?          []  No	    [x]  YesIs someone taking belongings home?   [x]  No     []  Yes  Who? (specify)                                   Scherrie Curt, RN

## 2024-03-05 NOTE — ED Notes
 5:29 AM Assumed care of ptsChief Complaint Patient presents with  Hip Pain   Pt BIBA from home s/p fall yesterday around 4:30pm. Pt's son found her on the ground in the bathroom. Pt c/o R hip pain. +rotation. Pt unable to bear weight on R leg. Pt is not on blood thinners. Unknown how long pt was down before her son found her. VSS. Pts staes that he live at home alone with no HHA and that her son checked on her very oftenPts is currently in bed, Alert with No sign of LOC or any Lac notedPts waiting on provider Eval

## 2024-03-05 NOTE — Other
 -------------------------------------------------------------------------------------------------Summary:Holly Erickson is an 83 year old female with a past medical history including cerebellar hemorrhage s/p fall, hyperlipidemia, and parathyroidectomy presenting to the emergency department with a tender lump on the left side of her anterior neck. Notably, the patient was seen by dermatology 4 days ago for a pruritic, erythematous rash on her extremities. She was prescribed triamcinolone cream and the rash is now resolved. Patient otherwise feels well and denies any chest pain, shortness of breath, cough, congestion, fever, chills, nausea, vomiting, diarrhea, recent travel, or sick contacts. Experiences discomfort in the left anterior throat when eating solid foods and swallowing but otherwise does not have a sore throat. Notes that this pain radiates to her left ear and she feels increased pressure with chewing. She is able to tolerate fluids and manage her secretions. Confirmed that this is not affecting her breathing. She reports she has never experienced this lump before and that it was not present when she had her rash. Relevant Physical Exam: Vitals:  03/03/24 1146 BP: 128/77 Pulse: 77 Resp: 15 Temp: 98.9 ?F (37.2 ?C) General:  Sitting up in chair, appears well, no distressHeart:  normal rate and rhythm, no murmur, gallops or rubLungs:  Clear to auscultation bilaterally.  Normal respiratory effortNeuro: Alert, oriented x3, normal speech, no focal findingsMusculoskeletal: Bilateral lower extremities without edema or tendernessPhysical ExamHENT:    Nose: No congestion or rhinorrhea.    Mouth/Throat:    Pharynx: Posterior oropharyngeal erythema present. No pharyngeal swelling, oropharyngeal exudate, uvula swelling or postnasal drip.    Tonsils: No tonsillar exudate or tonsillar abscesses. Neck:    Thyroid: No thyroid mass or thyroid tenderness.    Vascular: No JVD.    Trachea: Trachea normal.    Comments: mild erythema of the posterior oropharynx without exudate or edema, uvula midline, approximately 2.5 x 2.5 cm, round, mobile mass in the left deep anterior cervical / submandibular areaMusculoskeletal:    Cervical back: No edema, erythema, signs of trauma or rigidity. No pain with movement. Normal range of motion. Lymphadenopathy:    Cervical: Cervical adenopathy present.    Right cervical: No deep or posterior cervical adenopathy.   Left cervical: Deep cervical adenopathy present. No posterior cervical adenopathy.  Differential diagnosis: Concern for reactive lymph node in the setting of recent hypersensitivity rash vs lymphadenopathy due to strep pharyngitis or other URI vs less likely thyroid nodule vs less likely but will consider lymphoma Plan:  - Obtain viral respiratory panel testing- Perform neck ultrasound of mass - Outpatient follow up appointment in 1 week- PCP appointment in 2.5 weeks  - Contact [facility] if any new or acutely worsening symptoms,  breathing obstruction, inability to tolerate PO fluids or difficulty managing secretions. ED Course/Results:Labs:- CBC, CMP, thyroid labs. Slightly anemic but otherwise unremarkable. - Viral respiratory panel. Negative influenza, Covid, RSV, strep. Recent Results (from the past 24 hours) TSH w/reflex to FT4  Collection Time: 03/03/24 10:14 AM Result Value Ref Range  Thyroid Stimulating Hormone 1.420 See Comment ?IU/mL Group A streptococcus by PCR  Collection Time: 03/03/24 10:14 AM  Specimen: Throat; Swab Result Value Ref Range  Group A Streptococcus PCR Group A Streptococcus Not Detected Not Detected Symptomatic 4 Plex -Order only on patients highly likely to be admitted.  Collection Time: 03/03/24 10:14 AM  Specimen: Nasopharynx; Viral Result Value Ref Range  Influenza A Negative Negative  Influenza B Negative Negative  Respiratory Syncytial Virus Negative Negative  SARS-CoV-2 RNA (COVID-19)  Negative Negative CBC auto differential  Collection Time: 03/03/24 10:14 AM Result  Value Ref Range  WBC 6.6 4.0 - 11.0 x1000/?L  RBC 3.71 (L) 4.00 - 6.00 M/?L  Hemoglobin 12.2 11.7 - 15.5 g/dL  Hematocrit 69.62 95.28 - 45.00 %  MCV 98.4 80.0 - 100.0 fL  MCH 32.9 27.0 - 33.0 pg  MCHC 33.4 31.0 - 36.0 g/dL  RDW-CV 41.3 24.4 - 01.0 %  Platelets 161 150 - 420 x1000/?L  MPV 10.9 8.0 - 12.0 fL  Neutrophils 67.7 39.0 - 72.0 %  Lymphocytes 17.9 17.0 - 50.0 %  Monocytes 9.5 4.0 - 12.0 %  Eosinophils 2.6 0.0 - 5.0 %  Basophil 0.9 0.0 - 1.4 %  Immature Granulocytes 1.4 (H) 0.0 - 1.0 %  nRBC 0.0 0.0 - 1.0 %  Absolute Lymphocyte Count 1.17 0.60 - 3.70 x 1000/?L  Monocyte Absolute Count 0.62 0.00 - 1.00 x 1000/?L  Eosinophil Absolute Count 0.17 0.00 - 1.00 x 1000/?L  Basophil Absolute Count 0.06 0.00 - 1.00 x 1000/?L  Absolute Immature Granulocyte Count 0.09 0.00 - 0.30 x 1000/?L  Absolute nRBC 0.00 0.00 - 1.00 x 1000/?L  ANC (Abs Neutrophil Count) 4.44 2.00 - 7.60 x 1000/?L Basic metabolic panel  Collection Time: 03/03/24 10:14 AM Result Value Ref Range  Sodium 140 136 - 144 mmol/L  Potassium 4.6 3.3 - 5.3 mmol/L  Chloride 103 98 - 107 mmol/L  CO2 29 20 - 30 mmol/L  Anion Gap 8 7 - 17  Glucose 91 70 - 100 mg/dL  BUN 19 8 - 23 mg/dL  Creatinine 2.72 5.36 - 1.30 mg/dL  Calcium 9.1 8.8 - 64.4 mg/dL  BUN/Creatinine Ratio 03.4 8.0 - 23.0  eGFR (Creatinine) >60 >=60 mL/min/1.21m2  Creatinine Delta 0.17 See Comment Imaging: -  Reviewed the bedside ultrasound. Consistent with approximately 2.5 x 2.5 cm enlarged left anterior superior neck lymph node. MDM:  Patient is a 83 year old female with past medical history including parathyroidectomy and hypertension presenting to the ED with a new left sided, tender mass. The favored diagnosis is a suspected reactive lymphadenopathy in the setting of recent hypersensitivity rash. Strep pharyngitis, Covid, and Influenza were ruled out via viral testing. Other URI unlikely given no edema, exudate, congestion, rhinorrhea, cough, fever, or fatigue. Differential also includes less likely thyroid nodule vs lymphoma. Thyroid labs were unremarkable. There is currently a low perceived risk of malignancy given that the mass would more commonly gradually increase in size, be painless, and the appearance on ultrasound was consistent with lymph node.  Normal WBC, lack of systemic symptoms, and no overlying erythema or warmth are reassuring in terms of considering infectious cause. Recommended follow up at outpatient clinic at the end of the week and to keep her PCP appointment scheduled for 5/16 to re evaluate and ensure decrease in size. If no change, further enlargement, or additional symptoms, [place] imaging and possible biopsy will likely be indicated. Discussed patient with Dr. Sedonia Dad. Admission or placement in ED observation was considered, however not indicated at this time.  Maryanne Smiles, PA-S 03/03/2024-------------------------------------------------------------------------------------------------

## 2024-03-05 NOTE — Other
 Admission Note Nursing Holly Erickson is a 83 y.o. female admitted with a chief complaint of `R hip fx. Patient arrived from  EDPatient is   Aox4 Vitals:  03/05/24 0838 03/05/24 1140 03/05/24 1542 03/05/24 1628 BP: 111/65 105/64 112/60 (!) 112/52 Pulse: (!) 95 89 (!) 95 69 Resp: 17 16 18 19  Temp: 98.5 ?F (36.9 ?C) 99.2 ?F (37.3 ?C)  97.8 ?F (36.6 ?C) TempSrc: Oral Oral   SpO2: 95% 96% 96% 95% Oxygen therapy Oxygen TherapySpO2: 95 %Device (Oxygen Therapy): room airI have reviewed the patient's current medication orders..I have reviewed patient valuables Belongings charted in last 7 days:  Comments:Pt AOX4. VSS on RA, no complaints of NVD/SOB/CP/ calf tenderness. Tolerating the reg diet well. Voiding via PW, LBM: 4/29. Skin intact, T&R Q2h. IS use encouraged 10x/hr while awake. Neuros NNT. +DP and +radial pulses. DVT Ppx: HSQ. Pain managed w tylenol  and oxy effectively. Pt on bedrest. Incentive spirometer encouraged. Fall precautions maintained, call bell within reach. See flowsheets for details, continuing to monitor.BP (!) 112/52  - Pulse 69  - Temp 97.8 ?F (36.6 ?C) (Oral)  - Resp 19  - SpO2 95% Barrett Lick, RNSee flowsheets, patient education and plan of care for additional information.

## 2024-03-05 NOTE — Progress Notes
 Medicine - Consult NoteAttending Provider: Charm Coombs, DOAdmit Day: 4/30/2025Hospital Day: [facility] Question: Medical co-management for AKI and Rhabdomyolysis Additionally needs pre-op risk stratification for surgery on 5/2/25ID statement Holly Erickson is a 83 y.o. 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 Subjective Patient BIBA early this morning after unwitnessed fall the day prior. The patient endorses taking 100mg  of benadryl  on 03/03/24 to help her fall asleep. When she awakened Tuesday morning (03/04/24) she felt dizzy and then fell in her bathroom onto her right side; this was around 9am. She remained down for 8 hours until her son came and found her on the kitchen floor at around 4pm. She was lifted to a recliner chair and was comfortable, but then pain worsened overnight, so was brought to the ED. In the ED, vitals were stable, however had some soft BP's 90/50's. She was found to have AKI to 1.6, CK 5276, WBC 14.2, and UA with blood, leukocytes, and rare bacteria. She never endorsed urinary/GU symptoms. CXR was unremarkable, [place] Right hip showed an acute displaced periprosthetic fracture of the right intertrochanteric femur with the fracture planes extending to the greater and lesser trochanters. She was admitted to orthopaedic service for planned hip surgery on 03/07/24. Medications Scheduled Current Facility-Administered Medications Medication Dose Route Frequency Provider Last Rate Last Admin  acetaminophen  (TYLENOL ) tablet 975 mg  975 mg Oral Q6H [name], PA   975 mg at 03/05/24 1746  [START ON 03/06/2024] buPROPion  XL (WELLBUTRIN  XL) 24 hr tablet 150 mg  150 mg Oral Daily [name], PA      chlorhexidine gluconate (PERIDEX) 0.12 % solution 15 mL  15 mL Mouth/Throat Once [name], PA      chlorhexidine gluconate 2 % towelette   Topical (Top) Once [name], [place]      [XLKGM ON 03/06/2024] DULoxetine  (CYMBALTA ) DR capsule 60 mg  60 mg Oral Daily [name], [place]      gabapentin  (NEURONTIN ) capsule 100 mg  100 mg Oral BID WC [name], PA   100 mg at 03/05/24 1746  heparin  (PORCINE) injection 5,000 Units  5,000 Units Subcutaneous Q8H [name], [place]   5,000 Units at 03/05/24 1746  polyethylene glycol (MIRALAX ) packet 17 g  17 g Oral Daily [name], [place]      povidone-iodine  5 % nasal swab   Nasal Once [name], [place]      [START ON 03/06/2024] rosuvastatin  (CRESTOR ) tablet 40 mg  40 mg Oral Daily [name], PA      senna (SENOKOT) tablet 17.2 mg  2 tablet Oral Nightly [name], [place]      sodium chloride  0.9 % flush 3 mL  3 mL IV Push Q8H Benjamin, Tracy E, PA     Continuous sodium chloride    PRNbisacodyL, bisacodyL , morphine  (ADULT), morphine  (ADULT), ondansetron  (ZOFRAN ) IV Push, ondansetron , oxyCODONE , oxyCODONE , sodium chlorideObjective VitalsTemp:  [97.8 ?F (36.6 ?C)-99.3 ?F (37.4 ?C)] 99.3 ?F (37.4 ?C)Pulse:  [69-98] 98Resp:  [16-20] 20BP: (93-112)/(48-65) 93/53SpO2:  [95 %-97 %] 95 %Device (Oxygen Therapy): room airWeight:  Gross Totals (Last 24 hours) at 03/05/2024 1955Last data filed at 03/05/2024 1159Intake 1000 ml Output -- Net 1000 ml Physical ExamGeneral: alert, oriented times three, no apparent distress, elderly appearing age appropriateSkin: scattered seborrheic dermatoses and  excoriations Lungs: percussion normal, good diaphragmatic excursion, lungs clear to auscultation bilaterallyHeart: regular rate and rhythm, no murmurs, gallops or rubsExtremities/Musculoskeletal: Right hip tenderness to palpation, no lower extremity edema Diagnostics Notable for: CK: 5,276BloodHematologyRecent Labs Lab 04/28/251014 04/30/250631 WBC 6.6 14.2* HGB 12.2 9.1* HCT 36.50 27.40* PLT 161 141* MCV 98.4 96.8  Hematology DiffRecent Labs Lab 04/28/251014 04/30/250631 NEUTROPHILS 67.7 80.4* LYMPHOCYTES 17.9 9.0* MONOCYTES 9.5 10.0 EOSINOPHILS 2.6 0.0  ChemistriesRecent Labs Lab 04/28/251014 04/30/250630 NA 140 137 K 4.6 3.6 CL 103 99 CO2 29 23 BUN 19 55* CREATININE 0.87 1.60* GLU 91 137* ANIONGAP 8 15 CALCIUM 9.1 8.7*  ChemistriesRecent Labs Lab 04/28/251014 04/30/250630 GLU 91 137* Recent Labs Lab 04/28/251014 04/30/250630 CALCIUM 9.1 8.7*  LFT, Lipase, and CoagsNo results for input(s): ALT, AST, ALKPHOS, BILITOT, BILIDIR, ALBUMIN in the last 168 hours.Recent Labs Lab 04/30/250631 INR 1.08 PTT 32.2*  No results for input(s): FIBRINOGEN, DDIMER in the last 168 hours.Lab Results Component Value Date  INR 1.08 03/05/2024  PTT 32.2 (H) 03/05/2024   Blood GasNo results for input(s): PHART, PCO2ART, PO2ART, HCO3ART, O2SATART, LITERFLOW in the last 168 hours.No results for input(s): PHVEN, PCO2VEN, PO2VEN, O2SATVEN, LITERFLOW in the last 168 hours. Thyroid and Cortisol Labs:No results found for: CORTSOLTOTSE, CORTFREE, CRTSLPL Lab Results Component Value Date  TSH 1.420 03/03/2024   Diabetes and Blood Glucose LabsRecent Labs Lab 04/28/251014 04/30/250630 GLU 91 137* No results found for: HGBA1C LipidsLab Results Component Value Date  CHOL 160 07/12/2018  HDL 82 07/12/2018  LDL 45 16/08/9603  TRIG 165 (H) 07/12/2018  Inflammatory and Infectious MarkersNo results for input(s): HSCRP, FERRITIN, PROCALCITON, SEDRATE in the last 168 hours.  Cardiac and Muscle/Ischemia MarkersRecent Labs Lab 04/30/250630 CKTOTAL 5,276* No results found for: BNPPROLab Results Component Value Date  CKTOTAL 5,276 (H) 03/05/2024  No results for input(s): LACTATE in the last 168 hours. Labs Pending/In ProcessPending Lab Results   Order Current Status  Urine culture In process  Vitamin D 25 (vitamin D status)(MinMetabLab)(YH) In process   Blood culturesNo results found for this or any previous visit.Urine culturesNo results found for this or any previous visit.Lab Results Component Value Date  SARSCOV2 Negative 03/03/2024 RVP:Lab Results Component Value Date  INFLUENZAART Negative 03/03/2024  INFLUENZBRT Negative 03/03/2024 MRSA swab: negativeImagingCT Hip Right wo IV ContrastResult Date: 4/30/2025CT HIP RIGHT WO IV CONTRAST Clinical Indication: Osteonecrosis, hip, xray done. Comparison: Right hip radiographs 03/05/2024 Technique: [place] of the right hip was obtained without intravenous contrast administration.. Findings: Right total hip arthroplasty is present. There is acute displaced periprosthetic fracture of the right intertrochanteric femur with the fracture planes extending to the greater and lesser trochanters. There is proximal and medial displacement of the lesser trochanteric fragment. There is no definite evidence for paralleling lucency surrounding the hardware. There is associated surrounding soft tissue edema/hemorrhage of the hip and proximal thigh musculature. Left total hip arthroplasty is present without definite evidence for hardware loosening. Degenerative changes of the visualized lower lumbar spine, sacroiliac joints and symphysis pubis. No significant abnormality seen in the visceral pelvis. Impression: Periprosthetic fracture of the right intertrochanteric femur with associated surrounding soft tissue/muscle edema and/or hemorrhage. [place] [facility] Notify System Classification: Routine. Reported and signed by: Layman Pries, MD  [facility] Radiology and Biomedical Imaging [place] Head wo IV ContrastResult Date: 4/30/2025CT HEAD WO IV CONTRAST INDICATION: Head trauma, minor (Age >= 65y) COMPARISON: [place] HEAD WO IV CONTRAST 2023-08-15 TECHNIQUE: Serial [place] axial images were obtained from the skull base to the vertex without the administration of intravenous contrast. Coronal  and sagittal reconstructions are provided. FINDINGS: There is no acute intracranial hemorrhage or extra-axial fluid collection. There is no mass effect, edema or midline shift. There is no evidence of acute major vascular territory infarct. Bilateral globus pallidus mineralization. Mild prominence of the ventricles and sulci, consistent with diffuse parenchymal volume loss. The basal cisterns are patent.  No acute calvarial fracture. Bilateral lens replacements. Mucosal thickening/secretions in the ethmoid sinuses. The mastoid air cells are clear. No acute intracranial abnormality. Please note that Noncontrast Head Minor is not sensitive for the detection of ischemic infarct. If ischemic infarct is of clinical concern, additional clinical or imaging evaluation is recommended. [place] [facility] Notify System Classification: Routine.  Report initiated by:  Leota Randy, MD Reported and signed by: Elissa Guise, MD  [facility] Radiology and Biomedical Imaging XR Chest PA or APResult Date: 4/30/2025XR CHEST PA OR AP INDICATION: fall COMPARISON: XR CHEST PA OR AP 2023-08-13 FINDINGS: The cardiomediastinal silhouette is stable. No consolidation is appreciated. No large pleural effusion is seen. Thoracolumbar surgical fixation hardware is again seen, although incompletely imaged. A radiopaque foreign body projects over the right heart shadow.    No acute cardiothoracic abnormality. A radiopaque foreign body projecting over the right heart shadow likely external to the patient; recommend correlation with direct inspection. [place] [facility] Notify System Classification: Routine.  Report initiated by:  Reather Campbell, MD Reported and signed by: Mai Schwalbe, MD  [facility] Radiology and Biomedical Imaging XR Hip Right AP and LateralResult Date: 03/05/2024**FRACTURE** STUDY: XR HIP RIGHT AP AND LATERAL, XR FEMUR RIGHT AP AND LATERAL INDICATION: Right hip pain with suspected right hip fracture COMPARISON: XR HIP RIGHT AP AND LATERAL 2023-10-23 FINDINGS: Evaluation is somewhat limited secondary to diffuse osseous demineralization. Right hip: Patient is again noted to have undergone total right hip arthroplasty. There is an acutely displaced avulsion fracture of the lesser trochanter of the right femur, with additional areas of cortical discontinuity in the greater trochanter. The hardware otherwise appears in stable position. Left hip arthroplasty is also again seen. No additional fracture or dislocation is appreciated. The sacrum is obscured by overlying bowel. The sacroiliac joints appear unremarkable. There are degenerative changes of the lumbar spine. Right femur: Patient is status post total right knee arthroplasty without evidence of hardware complication. No additional fracture, dislocation or unexpected radiopaque foreign body is appreciated in the remainder of the femur. Avulsion fracture of the right lesser trochanter with additional lucencies in the greater trochanter raising concern for right intertrochanteric/periprosthetic fracture. [place] [facility] Notify System Classification: Routine. (accession [id number]), Routine. (accession [id number]) Report initiated by:  Reather Campbell, MD Reported and signed by: Mai Schwalbe, MD  [facility] Radiology and Biomedical Imaging XR Femur Right AP and LateralResult Date: 03/05/2024**FRACTURE** STUDY: XR HIP RIGHT AP AND LATERAL, XR FEMUR RIGHT AP AND LATERAL INDICATION: Right hip pain with suspected right hip fracture COMPARISON: XR HIP RIGHT AP AND LATERAL 2023-10-23 FINDINGS: Evaluation is somewhat limited secondary to diffuse osseous demineralization. Right hip: Patient is again noted to have undergone total right hip arthroplasty. There is an acutely displaced avulsion fracture of the lesser trochanter of the right femur, with additional areas of cortical discontinuity in the greater trochanter. The hardware otherwise appears in stable position. Left hip arthroplasty is also again seen. No additional fracture or dislocation is appreciated. The sacrum is obscured by overlying bowel. The sacroiliac joints appear unremarkable. There are degenerative changes of the lumbar spine. Right femur: Patient is status post total right knee arthroplasty without evidence of hardware complication. No  additional fracture, dislocation or unexpected radiopaque foreign body is appreciated in the remainder of the femur. Avulsion fracture of the right lesser trochanter with additional lucencies in the greater trochanter raising concern for right intertrochanteric/periprosthetic fracture. [place] [facility] Notify System Classification: Routine. (accession [id number]), Routine. (accession [id number]) Report initiated by:  Reather Campbell, MD Reported and signed by: Mai Schwalbe, MD  [facility] Radiology and Biomedical Imaging  ECG/ TeleSinus Rhythm Assessment and Plan: Holly Erickson is a 83 y.o. 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 AKI likely intra-renal from acute rhabdomyolysis in the setting of recent fall and hip fracture, and additionally mixed pre-renal from water losses during prolonged down time. Additionally, she has a questionable UA thought to be UTI vs asymptomatic bacteruria; she does not endorse urinary symptoms but did have some confusion and decision. Confusion could be attributed to large benadryl  dose from 2 nights ago. She is now alert and oriented x 4 on exam. She will need pre-operative risk stratification to be performed tomorrow 03/06/24 for the anticipated surgery on 03/07/24. RecommendationsDiagnostic- Monitor for development of metabolic abnormalities: BMP, Mg, Phosphorous CK and Uric acid every 6 hours - Urine electrolytes- Monitor I&O's (can place foley or purewick) - Await urine culture resultsTherapeutic- Fluid resuscitation for goal urine output 300cc/hr- Can start normal saline at 300cc/hr for now. Will titrate as necessary. - Avoid nephrotoxic agents- If urine culture is negative, can de-escalate antibiotics, as likely asymptomatic bacteruria. _________________________________________________________________________________________Case discussed with Dr. Trecia Friends. Recommendations are subject to change as per final attending addendum.Signed, [place] La, MDInternal Medicine PGY-204/30/25 7:55 PM

## 2024-03-05 NOTE — Other
 [facility]SULT  REQUEST  DOCUMENTATION-[facility] NOTE-Type of consult: [facility] Internal Medicine -New Consult: [id number] Maryagnes Small / Location: [phone] / Reason for Consult? risk stratification and help managment rhabdo and AKI/Callback Cell Phone: [phone]/ Use .consultnotetemplate or add .consultrecommendation to your consult notes. Remind your attending to use .consultattestation /Please confirm receipt of this message by texting back ?OK?-1 - Mobile Heartbeat message sent to [place] at 4:36 PM. Received response at 1649.-REMINDER: CONSULT CONNECT IS CLOSED 1900 TO 0700. PRIMARY TEAM MAY CONTACT YOU DIRECTLY ABOUT CONSULTS. Bambi Lever Hudd4/30/20254:35 [employer] [phone]

## 2024-03-05 NOTE — ED Provider Notes
 Chief Complaint Patient presents with ? Hip Pain   Pt BIBA from home s/p fall yesterday around 4:30pm. Pt's son found her on the ground in the bathroom. Pt c/o R hip pain. +rotation. Pt unable to bear weight on R leg. Pt is not on blood thinners. Unknown how long pt was down before her son found her. VSS. HPI/PE:B7H83 yo with prior hip repair comes with AMS and fall, Rt hip pain, afebrile, lungs CTA, abd soft NT, Rt hip tender, otherwise intact, hip Fx protocol activated, [place] r/o ICH, CK r/o rhabdo.  Fayez Sturgell Merlin Starks, DO   Physical ExamED Triage Vitals [03/05/24 0520]BP: (!) 107/54Pulse: (!) 95Pulse from  O2 sat: n/aResp: 18Temp: 98.1 ?F (36.7 ?C)Temp src: OralSpO2: 97 % BP 111/65  - Pulse (!) 95  - Temp 98.5 ?F (36.9 ?C) (Oral)  - Resp 17  - SpO2 95% Physical Exam ProceduresAttestation/Critical CarePatient Reevaluation: I reviewed the labs and (+) leukocytosis, base anemia, (+) rhabdo, (+) AKII reviewed the radiology images and (+) Rt hip Fx, no ICH per my readSpoke with ortho and wants [place] hipAn acute or life threatening problem was considered during this evaluation:A decision regarding hospitalization was made during this visit.Decision made to admitVinu Anamarie Hunn, DO Clinical Impressions as of 03/05/24 0911 Non-traumatic rhabdomyolysis AKI (acute kidney injury) (HC Code) Periprosthetic fracture around internal prosthetic right hip joint, initial encounter [facility] Code)  ED DispositionAdmit Carmelo Chock, DO04/30/25 0615 Carmelo Chock, DO04/30/25 [phone]

## 2024-03-05 NOTE — ED Notes
 6:31 AM IV access acquired Labs are collected Pts pending Imaging

## 2024-03-05 NOTE — Utilization Review (ED)
 Utilization Review from XsolisPatient Data:  Patient Name: Holly Erickson Age: 83 y.o. DOB: July 30, 1941	 MRN: [id number]	 Indicated Status: Inpatient Review Comments: S/p fall sustaining right periprosthetic hip fx. Seen by ortho's- plan for surgical vs non-op management. Total CK 5,276, on IVF's.

## 2024-03-05 NOTE — ED Notes
 7:25 AM - Report received from previous RN, care assumed. Pt to ED for fall. Resting in stretcher in no acute distress. Plan for [place].9:00 AM - Resting in stretcher in no acute distress. 11:18 AM - Resting in stretcher in no acute distress. 11:58 AM - Assisted on bed pan. Urine sample collected. Pt repositioned for comfort. Denies any other requests at this time. 1:01 PM - Report given to oncoming RN, care transferred.

## 2024-03-06 ENCOUNTER — Encounter: Admit: 2024-03-06 | Payer: PRIVATE HEALTH INSURANCE | Attending: Anesthesiology | Primary: Internal Medicine

## 2024-03-06 ENCOUNTER — Inpatient Hospital Stay: Admit: 2024-03-06 | Payer: Medicare (Managed Care) | Attending: Anesthesiology

## 2024-03-06 DIAGNOSIS — M9701XA Periprosthetic fracture around internal prosthetic right hip joint, initial encounter: Secondary | ICD-10-CM

## 2024-03-06 LAB — CBC WITHOUT DIFFERENTIAL
BKR WAM ANC (ABSOLUTE NEUTROPHIL COUNT): 4.3 x 1000/ÂµL (ref 2.00–7.60)
BKR WAM ANC (ABSOLUTE NEUTROPHIL COUNT): 4.68 x 1000/ÂµL (ref 2.00–7.60)
BKR WAM ANC (ABSOLUTE NEUTROPHIL COUNT): 4.18 x 1000/ÂµL (ref 2.00–7.60)
BKR WAM HEMATOCRIT (2 DEC): 20 % — ABNORMAL LOW (ref 35.00–45.00)
BKR WAM HEMATOCRIT (2 DEC): 23.1 % — ABNORMAL LOW (ref 35.00–45.00)
BKR WAM HEMATOCRIT (2 DEC): 21.8 % — ABNORMAL LOW (ref 35.00–45.00)
BKR WAM HEMOGLOBIN: 6.5 g/dL — ABNORMAL LOW (ref 11.7–15.5)
BKR WAM HEMOGLOBIN: 7.6 g/dL — ABNORMAL LOW (ref 11.7–15.5)
BKR WAM HEMOGLOBIN: 7.1 g/dL — ABNORMAL LOW (ref 11.7–15.5)
BKR WAM MCH (PG): 32.2 pg (ref 27.0–33.0)
BKR WAM MCH (PG): 32.9 pg (ref 27.0–33.0)
BKR WAM MCH (PG): 32.4 pg (ref 27.0–33.0)
BKR WAM MCHC: 32.5 g/dL (ref 31.0–36.0)
BKR WAM MCHC: 32.9 g/dL (ref 31.0–36.0)
BKR WAM MCHC: 32.6 g/dL (ref 31.0–36.0)
BKR WAM MCV: 99 fL (ref 80.0–100.0)
BKR WAM MCV: 100 fL (ref 80.0–100.0)
BKR WAM MCV: 99.5 fL (ref 80.0–100.0)
BKR WAM MPV: 10.6 fL (ref 8.0–12.0)
BKR WAM MPV: 10.9 fL (ref 8.0–12.0)
BKR WAM MPV: 10.8 fL (ref 8.0–12.0)
BKR WAM PLATELETS: 107 x1000/ÂµL — ABNORMAL LOW (ref 150–420)
BKR WAM PLATELETS: 119 x1000/ÂµL — ABNORMAL LOW (ref 150–420)
BKR WAM PLATELETS: 121 x1000/ÂµL — ABNORMAL LOW (ref 150–420)
BKR WAM RDW-CV: 13.7 % (ref 11.0–15.0)
BKR WAM RDW-CV: 13.8 % (ref 11.0–15.0)
BKR WAM RDW-CV: 13.8 % (ref 11.0–15.0)
BKR WAM RED BLOOD CELL COUNT.: 2.02 M/ÂµL — ABNORMAL LOW (ref 4.00–6.00)
BKR WAM RED BLOOD CELL COUNT.: 2.31 M/ÂµL — ABNORMAL LOW (ref 4.00–6.00)
BKR WAM RED BLOOD CELL COUNT.: 2.19 M/ÂµL — ABNORMAL LOW (ref 4.00–6.00)
BKR WAM WHITE BLOOD CELL COUNT: 6.5 x1000/ÂµL (ref 4.0–11.0)
BKR WAM WHITE BLOOD CELL COUNT: 6.5 x1000/ÂµL (ref 4.0–11.0)
BKR WAM WHITE BLOOD CELL COUNT: 6.6 x1000/ÂµL (ref 4.0–11.0)

## 2024-03-06 LAB — BASIC METABOLIC PANEL
BKR ANION GAP: 11 (ref 7–17)
BKR ANION GAP: 9 (ref 7–17)
BKR BLOOD UREA NITROGEN: 41 mg/dL — ABNORMAL HIGH (ref 8–23)
BKR BLOOD UREA NITROGEN: 30 mg/dL — ABNORMAL HIGH (ref 8–23)
BKR BUN / CREAT RATIO: 45.6 — ABNORMAL HIGH (ref 8.0–23.0)
BKR BUN / CREAT RATIO: 37.5 — ABNORMAL HIGH (ref 8.0–23.0)
BKR CALCIUM: 7.6 mg/dL — ABNORMAL LOW (ref 8.8–10.2)
BKR CALCIUM: 8 mg/dL — ABNORMAL LOW (ref 8.8–10.2)
BKR CHLORIDE: 110 mmol/L — ABNORMAL HIGH (ref 98–107)
BKR CHLORIDE: 110 mmol/L — ABNORMAL HIGH (ref 98–107)
BKR CO2: 19 mmol/L — ABNORMAL LOW (ref 20–30)
BKR CO2: 21 mmol/L (ref 20–30)
BKR CREATININE DELTA: -0.4
BKR CREATININE DELTA: -0.1
BKR CREATININE: 0.9 mg/dL (ref 0.40–1.30)
BKR CREATININE: 0.8 mg/dL (ref 0.40–1.30)
BKR EGFR, CREATININE (CKD-EPI 2021): >60 mL/min/{1.73_m2} (ref >=60)
BKR EGFR, CREATININE (CKD-EPI 2021): >60 mL/min/{1.73_m2} (ref >=60)
BKR GLUCOSE: 103 mg/dL — ABNORMAL HIGH (ref 70–100)
BKR GLUCOSE: 98 mg/dL (ref 70–100)
BKR POTASSIUM: 3.3 mmol/L (ref 3.3–5.3)
BKR POTASSIUM: 4.4 mmol/L (ref 3.3–5.3)
BKR SODIUM: 140 mmol/L (ref 136–144)
BKR SODIUM: 140 mmol/L (ref 136–144)

## 2024-03-06 LAB — CK     (BH GH L LMW YH)
BKR CREATINE KINASE TOTAL: 2638 U/L — ABNORMAL HIGH (ref 26–192)
BKR CREATINE KINASE TOTAL: 2375 U/L — ABNORMAL HIGH (ref 26–192)
BKR CREATINE KINASE TOTAL: 2361 U/L — ABNORMAL HIGH (ref 26–192)

## 2024-03-06 LAB — PHOSPHORUS     (BH GH L LMW YH)
BKR PHOSPHORUS: 2.4 mg/dL (ref 2.2–4.5)
BKR PHOSPHORUS: 2 mg/dL — ABNORMAL LOW (ref 2.2–4.5)
BKR PHOSPHORUS: 2.1 mg/dL — ABNORMAL LOW (ref 2.2–4.5)

## 2024-03-06 LAB — HEPATIC FUNCTION PANEL
BKR A/G RATIO: 1.4 (ref 1.0–2.2)
BKR ALANINE AMINOTRANSFERASE (ALT): 83 U/L — ABNORMAL HIGH (ref 10–35)
BKR ALBUMIN: 3.5 g/dL — ABNORMAL LOW (ref 3.6–5.1)
BKR ALKALINE PHOSPHATASE: 63 U/L (ref 9–122)
BKR ASPARTATE AMINOTRANSFERASE (AST): 154 U/L — ABNORMAL HIGH (ref 10–35)
BKR AST/ALT RATIO: 1.9
BKR BILIRUBIN DIRECT: 0.1 mg/dL (ref <=0.2)
BKR BILIRUBIN TOTAL: 0.3 mg/dL (ref <=1.2)
BKR GLOBULIN: 2.5 g/dL (ref 2.0–3.9)
BKR PROTEIN TOTAL: 6 g/dL (ref 5.9–8.3)

## 2024-03-06 LAB — IMMATURE PLATELET FRACTION (BH GH LMW YH)
BKR WAM IPF, ABSOLUTE: 5.6 x1000/ÂµL (ref <20.0)
BKR WAM IPF: 5.2 % (ref 1.2–8.6)

## 2024-03-06 LAB — URIC ACID
BKR URIC ACID: 5.6 mg/dL (ref 2.7–7.3)
BKR URIC ACID: 4.9 mg/dL (ref 2.7–7.3)
BKR URIC ACID: 4.4 mg/dL (ref 2.7–7.3)

## 2024-03-06 LAB — LACTATE DEHYDROGENASE: BKR LACTATE DEHYDROGENASE: 433 U/L — ABNORMAL HIGH (ref 122–241)

## 2024-03-06 LAB — MAGNESIUM
BKR MAGNESIUM: 2 mg/dL (ref 1.7–2.4)
BKR MAGNESIUM: 2.1 mg/dL (ref 1.7–2.4)
BKR MAGNESIUM: 2.1 mg/dL (ref 1.7–2.4)

## 2024-03-06 LAB — URINE CULTURE

## 2024-03-06 LAB — HAPTOGLOBIN: BKR HAPTOGLOBIN: 183 mg/dL (ref 30–200)

## 2024-03-06 MED ORDER — SODIUM CHLORIDE 0.9 % INTRAVENOUS SOLUTION
INTRAVENOUS | Status: DC
Start: 2024-03-06 — End: 2024-03-06

## 2024-03-06 MED ORDER — MELATONIN 3 MG TABLET
3 mg | Freq: Every evening | ORAL | Status: DC | PRN
Start: 2024-03-06 — End: 2024-03-11
  Administered 2024-03-06 – 2024-03-09 (×4): 3 mg via ORAL

## 2024-03-06 MED ORDER — SODIUM CHLORIDE 0.9 % IV BOLUS NEW BAG (DROPS CHARGE)
0.9 | Freq: Once | INTRAVENOUS | Status: CP
Start: 2024-03-06 — End: ?
  Administered 2024-03-06: 03:00:00 0.9 mL/h via INTRAVENOUS

## 2024-03-06 MED ORDER — POTASSIUM CHLORIDE ER 20 MEQ TABLET,EXTENDED RELEASE(PART/CRYST)
20 | Freq: Once | ORAL | Status: CP
Start: 2024-03-06 — End: ?
  Administered 2024-03-06: 10:00:00 20 MEQ via ORAL

## 2024-03-06 MED ORDER — TRAZODONE (DESYREL) 25 MG HALFTAB
25 | Freq: Once | ORAL | Status: CP
Start: 2024-03-06 — End: ?
  Administered 2024-03-07: 25 mg via ORAL

## 2024-03-06 MED ORDER — SODIUM CHLORIDE 0.9 % INTRAVENOUS SOLUTION
INTRAVENOUS | Status: DC
Start: 2024-03-06 — End: 2024-03-09
  Administered 2024-03-06 – 2024-03-08 (×5): via INTRAVENOUS

## 2024-03-06 MED ORDER — HEPARIN (PORCINE) 5,000 UNIT/ML INJECTION SOLUTION
5000 | Freq: Three times a day (TID) | SUBCUTANEOUS | Status: DC
Start: 2024-03-06 — End: 2024-03-07
  Administered 2024-03-06: 21:00:00 5000 mL via SUBCUTANEOUS

## 2024-03-06 NOTE — Progress Notes
 Orthopedic Progress Note: Patient was examined laying comfortably in bed in NAD. Pain is currently not too bad when I'm just laying here. She is upset that she is not having Dr. Kendra Pavy do her surgery and she does not want to sign her consent form until she's met Dr. Serina Dane. She does not remember meeting him this hospitalization. She has not been pleased with her care here so far, my meds were all messed up last night. I have a UTI which is very complex at 83 years old Tolerating diet well. Voiding via Foley (patient refusing Purewick). Passing flatus, last BM 4/29 prior to admission Denies calf tenderness, N/V, abd pain, CP, SOB, headache, dizziness, weakness. All questions answered to patient's satisfaction. Vital Signs:Vitals:  03/06/24 0734 BP: (!) 115/50 Pulse: 81 Resp: 20 Temp: 97.8 ?F (36.6 ?C) Physical Exam:NAD, alert, oriented, responding to questions appropriatelyRespirations unlabored on RAAbdomen soft, non-tender RLEThigh compartment is soft and minimally tenderNo significant ecchymosis + tibialis anterior, gastroc, EHL/FHL bilaterally SILT to distal LE bilaterallyCalves soft, nontender to palpation, compressible bilaterallyDP/PT pulses present bilaterallyToes wwp bilaterallySCDs in place bilaterally Labs: Lab Results Component Value Date  RBC 2.31 (L) 03/06/2024  HGB 7.6 (L) 03/06/2024  HCT 23.10 (L) 03/06/2024  PLT 119 (L) 03/06/2024  NEUTROPHILS 80.4 (H) 03/05/2024 Lab Results Component Value Date  NA 140 03/06/2024  K 3.3 03/06/2024  PHOS 2.4 03/06/2024  CREATININE 0.90 03/06/2024  BUN 41 (H) 03/06/2024  CO2 19 (L) 03/06/2024  MG 2.0 03/06/2024 CrCl cannot be calculated (Unknown ideal weight.).Imaging:[place] Hip Right wo IV ContrastResult Date: 4/30/2025CT HIP RIGHT WO IV CONTRAST Clinical Indication: Osteonecrosis, hip, xray done. Comparison: Right hip radiographs 03/05/2024 Technique: [place] of the right hip was obtained without intravenous contrast administration.. Findings: Right total hip arthroplasty is present. There is acute displaced periprosthetic fracture of the right intertrochanteric femur with the fracture planes extending to the greater and lesser trochanters. There is proximal and medial displacement of the lesser trochanteric fragment. There is no definite evidence for paralleling lucency surrounding the hardware. There is associated surrounding soft tissue edema/hemorrhage of the hip and proximal thigh musculature. Left total hip arthroplasty is present without definite evidence for hardware loosening. Degenerative changes of the visualized lower lumbar spine, sacroiliac joints and symphysis pubis. No significant abnormality seen in the visceral pelvis. Impression: Periprosthetic fracture of the right intertrochanteric femur with associated surrounding soft tissue/muscle edema and/or hemorrhage. [place] [facility] Notify System Classification: Routine. Reported and signed by: Layman Pries, MD  [facility] Radiology and Biomedical Imaging  A/P: Holly Erickson is a 83 y.o. female with hx of bilateral THA, most recently right THA with Dr. Kendra Pavy on 07/30/23 who sustained a right periprosthetic hip fracture s/p mechanical fall. Pending surgical fixation. Plan: - Bedrest pending OR- VTE Prophylaxis: HSQ, SCDs, OOBA- Continue home medications as appropriate - Medicine following for pre op work up and co-management of AKI (resolved)/Rhabdo (CK downtrending) 	- NS 100cc/hr	- Repeat labs tonight - Pain control- Diet: as tolerated- Bowel regimen - Incentive spirometry- Will observe acute blood loss anemia and monitor vital signs, no indication for pRBC transfusion at this time 	- Hgb 6.5 -> 7.6 on recheck; suspect dilutional labs; vitals are stable pt is asymptomatic, will hold off on transfusion and will repeat CBC tonight - Dispo planning: pending OR Pt does not want to sign surgical consent form until she has talked with Dr. Serina Dane (does not remember talking to him this morning). She is upset Dr. Kendra Pavy is not performing her surgery. Electronically Signed by Silvano Drop  Blas Buoy, May 1, 2025SRC Joint Service can be contacted on [facility] Joint Serv Prov Dynamic Role at [phone] Discharge Date: 03/08/24 \

## 2024-03-06 NOTE — Plan of Care
 Plan of Care Overview/ Patient Status7p-7a:Pt is A&Ox3-4. Answers orientation questions correctly but often tangential & disorganized w/ thoughts. Not sleeping overnight. BP soft at start of shift. Covering provider [place] notified. IVF increased to 300 ml/hr & IV ceftriaxone  initiated for possible UTI. Continent x2. LBM 4/29. Purewick in place. No output as of 0100, bladder scan completed for 178. Dr. Eleanore Grey notified. Foley & bolus ordered. Good urine output after foley insertion. Bedrest at this time, Ax2 in bed. No c/o numbness or tingling, +pp. SCDs in place & heparin  SQ for AC. Skin CDI. Pain well controlled w/ scheduled tylenol . Pt resting quietly w/ call bell in reach. Bed alarm on & safety maintained. Frequent rounding completed. Electronically Signed by Roma Close, RN, May 1, 20254:15 AM

## 2024-03-06 NOTE — Plan of Care
 Problem: Adult Inpatient Plan of CareGoal: Plan of Care ReviewOutcome: Interventions implemented as appropriate Problem: Adult Inpatient Plan of CareGoal: Patient-Specific Goal (Individualized)Outcome: Interventions implemented as appropriate Problem: Adult Inpatient Plan of CareGoal: Absence of Hospital-Acquired Illness or InjuryOutcome: Interventions implemented as appropriate Problem: Adult Inpatient Plan of CareGoal: Optimal Comfort and WellbeingOutcome: Interventions implemented as appropriate Problem: Fall Injury RiskGoal: Absence of Fall and Fall-Related InjuryOutcome: Interventions implemented as appropriate Problem: Skin Injury Risk IncreasedGoal: Skin Health and IntegrityOutcome: Interventions implemented as appropriate Plan of Care Overview/ Patient StatusPt in bed, alert and oriented x 4, confused at times, on RA tolerating it well, has a foley on BSD draining clear yellow urine, on IVF of NS @ 100 ml/hr, denies any pain for now.  Assisted pt with her needs, scheduled meds given as per order, safety precaution maintained, monitoring in progress.

## 2024-03-06 NOTE — H&P
 ORTHOPAEDICS & REHABILITATION H&PPatient name:  Holly Erickson MRN:	  [id number] Date of birth:	  1942/09/27Date of visit:    4/30/2025Attending of record for this patient: Jerona Mooring, MD   Provider leaving this note:	 Elvis Hamming, PAConsulting Provider: Dr. Enrique Harvest of Present Illness:Holly Erickson is a 83 y.o. female hx of bilateral total hip arthroplasty (right THA with Dr. Kendra Pavy on 07/30/2023) presents to the emergency department after a fall on her right side found to have a right periprosthetic hip fracture.  Patient states that Monday night she took 1-2 Benadryl  and went to bed.  When she woke up yesterday morning she was a slightly dizzy which caused her to follow up her on her right side.  She does not know what time this was and her son tried calling her in the morning which she did not pick up.  When he went to the house later in the afternoon around 4:00 p.m. that is when he found her on the ground on the kitchen floor.  He is able to pick her up to the recliner chair and patient became comfortable there overnight however presented this morning due to ongoing right hip pain.  Patient has not been able to bear weight or ambulate since the fall.  No paresthesias to the right lower extremity.  No other concerns at this time.Past Medical History:Past Medical History: Diagnosis Date  Arrhythmia   Colon polyp   Constipation   Depression   Hyperlipidemia  Past Surgical History: Procedure Laterality Date  APPENDECTOMY    BACK SURGERY    COLONOSCOPY  03/11/2018  [facility]  HERNIA REPAIR    HYSTERECTOMY    OOPHORECTOMY Bilateral   PARATHYROIDECTOMY    REPLACEMENT TOTAL HIP W/  RESURFACING IMPLANTS Left   REPLACEMENT TOTAL KNEE BILATERAL    RHINOPLASTY    ROTATOR CUFF REPAIR Right   TONSILLECTOMY   Social History:Social History Socioeconomic History  Marital status: Married   Spouse name: Not on file  Number of children: Not on file  Years of education: Not on file  Highest education level: Not on file Occupational History  Not on file Tobacco Use  Smoking status: Every Day  Smokeless tobacco: Not on file Substance and Sexual Activity  Alcohol use: Not Currently  Drug use: Not on file  Sexual activity: Not on file Other Topics Concern  Not on file Social History Narrative  Pt lives alone in an apartment with elevator access. PTA pt independent with ADLs, amb with SC as needed, son helps ( not available 24/7) At [facility] and d/c for one day. Home services haven't started yet.   Waynard Hailstone,  DPT  08/14/2023     Social Drivers of Health Financial Resource Strain: Not on file Food Insecurity: No Food Insecurity (07/29/2023)  Hunger Vital Sign   Worried About Running Out of Food in the Last Year: Never true   Ran Out of Food in the Last Year: Never true Transportation Needs: No Transportation Needs (07/29/2023)  PRAPARE - Transportation   Lack of Transportation (Medical): No   Lack of Transportation (Non-Medical): No Physical Activity: Not on file Stress: Not on file Social Connections: Not on file Intimate Partner Violence: Not on file Housing Stability: Low Risk  (08/15/2023)  Housing Stability   Housing Stability: I have a steady place to live   Housing Stability: Not on file Medications:No current facility-administered medications for this encounter.Current Outpatient Medications:   ascorbic acid (VITAMIN C ORAL), Take by mouth daily., Disp: ,  Rfl:   bisacodyL  (DULCOLAX) 10 mg suppository, Place 1 suppository (10 mg total) rectally daily as needed for constipation., Disp: 12 suppository, Rfl:   buPROPion  SR (WELLBUTRIN  SR) 100 mg 12 hr tablet, 1 tab(s) orally once a day for 30 days, Disp: , Rfl:   camphor-menthoL  (SARNA) 0.5-0.5 % lotion, Apply topically as needed for itching. Keep in fridge., Disp: 222 mL, Rfl: 2  cholecalciferol , vitamin D3, 10 mcg (400 unit) tablet, Take 2 tablets (800 Units total) by mouth daily., Disp: 30 tablet, Rfl: 2  DULoxetine  (CYMBALTA ) 60 MG capsule, Take 1 capsule (60 mg total) by mouth daily., Disp: , Rfl:   enzymes,digestive (DIGESTIVE ENZYMES ORAL), Take by mouth daily., Disp: , Rfl:   gabapentin  (NEURONTIN ) 100 mg capsule, Take 1 capsule (100 mg total) by mouth 2 (two) times daily with breakfast and dinner., Disp: , Rfl:   melatonin 5 mg tablet, Take 1 tablet (5 mg total) by mouth nightly., Disp: , Rfl:   polyethylene glycol (MIRALAX ) 17 gram packet, Take 1 packet (17 g total) by mouth daily. Mix in 8 ounces of water, juice, soda, coffee or tea prior to taking., Disp: 14 each, Rfl: 2  rosuvastatin  (CRESTOR ) 40 mg tablet, Take 1 tablet (40 mg total) by mouth daily., Disp: , Rfl:   triamcinolone (KENALOG) 0.1 % ointment, Apply topically 2 (two) times daily. To arms, legs, and back for 2 weeks then decrease to 2-3 times a week as needed for itching, Disp: 453.6 g, Rfl: 2  zinc  gluconate 50 mg tablet, Take 1 tablet (50 mg total) by mouth daily., Disp: , Rfl: Allergies:Fentanyl , Nsaids (non-steroidal anti-inflammatory drug), and Sodium pentothal [thiopental sodium]Review of Systems:Constitutional: Neg for fever or chillsHEENT: Neg for sore throatNeck:  Negative for neck painLungs: Neg for shortness of breathHeart: Neg for chest pain Abdomen: Neg for abdominal pain, nausea, vomiting or diarrheaGU: Neg for dysuriaMusculoskeletal: POS right hip painNeurologic:  Negative for numbness, tingling, dizziness, or weaknessSkin:  Negative for abrasion or lacerationLymph: Neg for bruises/bleeds easilyVitals:BP Readings from Last 1 Encounters: 03/05/24 105/64  Pulse Readings from Last 1 Encounters: 03/05/24 89  Resp Readings from Last 1 Encounters: 03/05/24 16  Temp Readings from Last 1 Encounters: 03/05/24 99.2 ?F (37.3 ?C) (Oral)  SpO2 Readings from Last 1 Encounters: 03/05/24 96% Physical Exam:General: 	Awake, alert, NAD, mental status at baselineHead: NC/AT.  ENT:	Atraumatic. Moist mucous membranes.Neck: Trachea Midline.  No vertebral tendernessHeart: 	Regular rate and rhythmLungs: 	Clear to auscultation bilaterallyAbdomen: 	Soft, non-tenderPelvis: 	Stable, non-tender to palpation, no crepitusMSK Extremity:Right hip:	Prior incisional scar that is well healed without any erythema, fluctuance, drainage	Right foot is held in slight external rotation	Tenderness to palpation over the proximal femur	Tibialis anterior, gastrocnemius, extensor hallucis longus and flexor hallucis longus intact	Unable to straight leg raise due to pain	Pain with any attempted passive hip flexion/extension or internal/external rotation	No ecchymosis	Palpable DP/PT pulses	Distal NVISkin: Intact.  No ErythemaWeightbearing: Has been unable to bear weight since the fallImaging: Results of imaging independently reviewed by me are:[place] wo IV ContrastResult Date: 4/30/2025No acute intracranial abnormality. Please note that Noncontrast Head [place] is not sensitive for the detection of ischemic infarct. If ischemic infarct is of clinical concern, additional clinical or imaging evaluation is recommended. [place] [facility] Notify System Classification: Routine.  Report initiated by:  Leota Randy, MD Reported and signed by: Elissa Guise, MD  [facility] Radiology and Biomedical Imaging XR Chest PA or APResult Date: 03/05/2024   No acute cardiothoracic abnormality. A radiopaque foreign body projecting over the right heart shadow likely external to the patient; recommend correlation  with direct inspection. [place] [facility] Notify System Classification: Routine.  Report initiated by:  Reather Campbell, MD Reported and signed by: Mai Schwalbe, MD  [facility] Radiology and Biomedical Imaging XR Hip Right AP and LateralResult Date: 4/30/2025Avulsion fracture of the right lesser trochanter with additional lucencies in the greater trochanter raising concern for right intertrochanteric/periprosthetic fracture. [place] [facility] Notify System Classification: Routine. (accession [id number]), Routine. (accession [id number]) Report initiated by:  Reather Campbell, MD Reported and signed by: Mai Schwalbe, MD  [facility] Radiology and Biomedical Imaging XR Femur Right AP and LateralResult Date: 4/30/2025Avulsion fracture of the right lesser trochanter with additional lucencies in the greater trochanter raising concern for right intertrochanteric/periprosthetic fracture. [place] [facility] Notify System Classification: Routine. (accession [id number]), Routine. (accession [id number]) Report initiated by:  Reather Campbell, MD Reported and signed by: Mai Schwalbe, MD  [facility] Radiology and Biomedical Imaging  [employer] HIP RIGHT WO IV CONTRASTClinical Indication: Osteonecrosis, hip, xray done. Comparison: Right hip radiographs 4/30/2025Technique: [place] of the right hip was obtained without intravenous contrast administration..Findings:Right total hip arthroplasty is present. There is acute displaced periprosthetic fracture of the right intertrochanteric femur with the fracture planes extending to the greater and lesser trochanters. There is proximal and medial displacement of the lesser trochanteric fragment. There is no definite evidence for paralleling lucency surrounding the hardware. There is associated surrounding soft tissue edema/hemorrhage of the hip and proximal thigh musculature.Left total hip arthroplasty is present without definite evidence for hardware loosening.Degenerative changes of the visualized lower lumbar spine, sacroiliac joints and symphysis pubis.No significant abnormality seen in the visceral pelvis.Impression:Periprosthetic fracture of the right intertrochanteric femur with associated surrounding soft tissue/muscle edema and/or hemorrhage.Labs:Lab Results Component Value Date  WBC 14.2 (H) 03/05/2024  HGB 9.1 (L) 03/05/2024  HCT 27.40 (L) 03/05/2024  MCV 96.8 03/05/2024  PLT 141 (L) 03/05/2024 Lab Results Component Value Date  BUN 55 (H) 03/05/2024 Lab Results Component Value Date  CREATININE 1.60 (H) 03/05/2024 Lab Results Component Value Date  NA 137 03/05/2024  K 3.6 03/05/2024  CL 99 03/05/2024  CO2 23 03/05/2024 Lab Results Component Value Date  INR 1.08 03/05/2024  INR 1.17 (H) 08/13/2023  INR 1.18 (H) 07/29/2023 EKG: Scanned into chart Assessment:Semaja Sankey is a 83 y.o. female hx of bilateral total hip arthroplasty (right THA with Dr. Kendra Pavy on 07/30/2023) presents to the emergency department after a fall on her right side found to have a right periprosthetic hip fracture. Patient will be admitted to orthopedics for further surgical discussion versus trial of non operative management.  Plan:- Admit to orthopedics under Dr. Serina Dane - NWB RLE, bedrest for now- Trial non op vs surgical fixation pending further discussion - Medicine consult for pre op risk stratification and AKI due to rhabdo- Okay for diet for now, NPO if surgery is scheduled - HSQ for now, will hold for surgery - continue home meds- Pain control - Bowel reg- D/w Dr. Serina Dane who agrees with the above plan, final plan pending attending attestationDictation device and software may have been used during this encounter, please excuse any spelling, word substitution, or punctuation errors. Electronically Signed by Elvis Hamming, PA, April 30, 2025[facility] can be contacted on [facility] Fract Serv Prov Dynamic Role at [phone]

## 2024-03-06 NOTE — Progress Notes
 [facility]-SrcSpiritual Care NoteAssessment:  Religion:NonePre-op visit with Patient. Chaplain introduced self and role to Patient. Patient described her desire for physician Dr. Kendra Pavy to perform her scheduled hip surgery as he has done her past surgeries and she trusts him. Chaplain offered empathetic response and asked her about her past experiences navigating the hospital and surgical procedures. She shared some life history, including time served as a Engineer, civil (consulting). She said she is relatively comfortable for now unless she tries to move, and that she knows she needs to behave herself now and in the recovery phase post-surgery to allow her body to heal. Chaplain validated expressed emotions and offered encouragement. Intervention:  Referral Source: (P) ProtocolResponding Chaplain: (P) Unit ChaplainVisit and Intervention Type: (P) Pre-op Spiritual care interventions provided: Relational or Interpersonal Interventions: : (P) Active Listening and or Reflection, Spiritual Support/Presence, Help Communicating with the Health Care Team, Empathy, Introduction to Chaplaincy ServicesMeaning-Making Interventions: : (P) Life Review and/or Story Listening, Ethical/Values Consultation or Guidance Outcome: With the help of the chaplain, patient/loved one(s): Cultural, Religious or Spiritual Resources Outcomes: : (P) Felt More Respected Culturally or PersonallyRelational or Interpersonal Outcomes:: (P) Established a Relationship with the Chaplain, Expressed Emotions for Catharsis, Felt More at Peace Plan:  No further spiritual care interventions needed at this time. The chaplain provided education surrounding the availability of spiritual care moving forward and how they can request a follow-up visit if desired.Janine Melbourne, SRCMHB: [phone] Chaplain available 24 hours MHB: [phone] Chaplains are available 24/7 for emotional, spiritual, religious, and existential support for all patients, their loved ones, and staff - for those who are religious and those who are not.When to Call a Chaplain:N: New diagnosis E: Emotional / spiritual distress E: Existential distress D: Decision making / goals of care meetingsS: Support for staff and patients' loved ones   C: Compromised copingA: Anxiety / stress / grief / loneliness R: Religious / cultural / ritual needsE: End-of-life care / death/dying 03-08-24 11:38 AM

## 2024-03-06 NOTE — Consults
 [facility] HealthPreoperative Medical Consultation___________________________ Planned Intervention: right total hip revisionDate of Surgery: 5/2/2025___________________________ HPI:83 y.o. female with PMHx of MDD, HLD, smoking (quit 1 month ago), IDA, pHTN (on TTE 07/2023, RVSP 65), neuropathy and prior right hip fracture s/p total hip arthroplasty (07/2023) admitted after a fall at home with down time >8hrs, found to have periprosthetic fracture of the right intertrochanteric femur, AKI and rhabdo. AKI and rhabdo now improving with aggressive fluid resuscitation. Planning for surgical fixation of fracture with ortho. __________________________ Preoperative Revised Cardiac Risk IndexHigh-risk surgery: NoIschemic heart disease: NoHistory of congestive heart failure: No - pHTNHistory of cerebrovascular disease: NoInsulin therapy for diabetes: NoSerum creatinine >2.0 mg/dL: NoThe RCRI score is 0.0- Very Low Risk. Estimated Rate of Myocardial Infarction, Pulmonary Edema, Ventricular Fibrillation, Cardiac Arrest, or Complete Heart Block is 3.9%Gupta Perioperative Cardiac Rolinda Climes estimated total risk is 0.2% ACS NSQIP RiskThe ACS NSQIP score: 5% risk of serious complication, 5.1% risk of any complicationFunctional Capacity4-10 METsAnesthesia Historyhas had prior surgery with general anesthesia without acute problems - but endorses post-operative deliriumPulmonary Risk AssessmentRespiratory infection in last month: NoUndergoing upper abdominal or intrathoracic surgery: NoIs planned surgery upper abdominal or intrathoracic: NoKnown OSA: NoHistory of anemia: yesReduced oxygen saturation: NoDuration of surgery > 2 hours: NoCurrent smoker: NoSmoker within the year: yes - quit 1 month agoThromboembolic Risk AssessmentPrior DVT/PE:  Yes (>10 years ago, provoked iso fracture/surgery)Hypercoaguable state: NoStroke in past month: NoBMI > 25: NoCaprini risk score is >8 [highest] Compression boots AND prophylactic anticoagulation for 30 days total. VTE rate = 6-18%Other Relevant HistoryBleeding problems (self or family):  NoFamily history of anesthetic problems: No Antiplatelet use:  NoAnticoagulant use:  NoInsulin use:   NoOral anti-hyperglycemic use:  NoSteroid use in past year: NoSmoking status: NoAlcohol use: NoRecreational drug use: No___________________________ MedicationsNo current facility-administered medications on file prior to encounter. Current Outpatient Medications on File Prior to Encounter Medication Sig Dispense Refill  ascorbic acid (VITAMIN C ORAL) Take by mouth daily.    bisacodyL  (DULCOLAX) 10 mg suppository Place 1 suppository (10 mg total) rectally daily as needed for constipation. 12 suppository   buPROPion  SR (WELLBUTRIN  SR) 100 mg 12 hr tablet 1 tab(s) orally once a day for 30 days    camphor-menthoL  (SARNA) 0.5-0.5 % lotion Apply topically as needed for itching. Keep in fridge. 222 mL 2  cholecalciferol , vitamin D3, 10 mcg (400 unit) tablet Take 2 tablets (800 Units total) by mouth daily. 30 tablet 2  DULoxetine  (CYMBALTA ) 60 MG capsule Take 1 capsule (60 mg total) by mouth daily.    enzymes,digestive (DIGESTIVE ENZYMES ORAL) Take by mouth daily.    gabapentin  (NEURONTIN ) 100 mg capsule Take 1 capsule (100 mg total) by mouth 2 (two) times daily with breakfast and dinner.    melatonin 5 mg tablet Take 1 tablet (5 mg total) by mouth nightly.    polyethylene glycol (MIRALAX ) 17 gram packet Take 1 packet (17 g total) by mouth daily. Mix in 8 ounces of water, juice, soda, coffee or tea prior to taking. 14 each 2  rosuvastatin  (CRESTOR ) 40 mg tablet Take 1 tablet (40 mg total) by mouth daily.    triamcinolone  (KENALOG ) 0.1 % ointment Apply topically 2 (two) times daily. To arms, legs, and back for 2 weeks then decrease to 2-3 times a week as needed for itching 453.6 g 2  zinc  gluconate 50 mg tablet Take 1 tablet (50 mg total) by mouth daily.   ___________________________ AllergiesAllergies Allergen Reactions  Fentanyl  Dizziness, Itching, Other (See Comments), Rash  and Unknown   PATCH ONLY.. Oral form is oksweating duragesic  patch only; can tolerate IV Itching.  Duragesic  patch.sweating duragesic  patch only; can tolerate IV Itching.  Duragesic  patch.  PATCH ONLY.. Oral form is ok  Nsaids (Non-Steroidal Anti-Inflammatory Drug) Diarrhea and Other (See Comments)   Abdominal cramping. horrible cramps.  Sodium Pentothal [Thiopental Sodium]    Childhood ___________________________Past Medical History: Diagnosis Date  Arrhythmia   Colon polyp   Constipation   Depression   Hyperlipidemia  Past Surgical History: Procedure Laterality Date  APPENDECTOMY    BACK SURGERY    COLONOSCOPY  03/11/2018  [facility]  HERNIA REPAIR    HYSTERECTOMY    OOPHORECTOMY Bilateral   PARATHYROIDECTOMY    REPLACEMENT TOTAL HIP W/  RESURFACING IMPLANTS Left   REPLACEMENT TOTAL KNEE BILATERAL    RHINOPLASTY    ROTATOR CUFF REPAIR Right   TONSILLECTOMY   ___________________________ Physical ExamVitals:  03/05/24 2350 03/06/24 0521 03/06/24 0734 03/06/24 1153 BP: (!) 108/59 (!) 102/50 (!) 115/50 (!) 100/54 Pulse: 81 (!) 57 81 76 Resp: 18 18 20 20  Temp: 97.8 ?F (36.6 ?C) 97.9 ?F (36.6 ?C) 97.8 ?F (36.6 ?C) 98.4 ?F (36.9 ?C) TempSrc: Oral Oral Oral Oral SpO2: 95% 94% 99% 97% - General: well-appearing, comfortable, eating breakfast - HEENT: anicteric sclera, moist mucus membranes- Heart: regular rate and rhythm, S1 and S2 normal, no murmurs- Lungs: clear to auscultation bilaterally- Abdomen: soft, nontender, nondistended- Extremities: warm, well-perfused, no peripheral edema- Neuro: alert, oriented, all extremities moving spontaneously- Psych: pleasant, cooperative___________________________ DiagnosticsRecent Results (from the past 26 weeks) CBC auto differential  Collection Time: 03/05/24  6:31 AM Result Value Ref Range  WBC 14.2 (H) 4.0 - 11.0 x1000/?L  RBC 2.83 (L) 4.00 - 6.00 M/?L  Hemoglobin 9.1 (L) 11.7 - 15.5 g/dL  Hematocrit 78.46 (L) 96.29 - 45.00 %  MCV 96.8 80.0 - 100.0 fL  MCH 32.2 27.0 - 33.0 pg  MCHC 33.2 31.0 - 36.0 g/dL  RDW-CV 52.8 41.3 - 24.4 %  Platelets 141 (L) 150 - 420 x1000/?L  MPV 11.1 8.0 - 12.0 fL  Neutrophils 80.4 (H) 39.0 - 72.0 %  Lymphocytes 9.0 (L) 17.0 - 50.0 %  Monocytes 10.0 4.0 - 12.0 %  Eosinophils 0.0 0.0 - 5.0 %  Basophil 0.2 0.0 - 1.4 %  Immature Granulocytes 0.4 0.0 - 1.0 %  nRBC 0.0 0.0 - 1.0 %  Absolute Lymphocyte Count 1.28 0.60 - 3.70 x 1000/?L  Monocyte Absolute Count 1.42 (H) 0.00 - 1.00 x 1000/?L  Eosinophil Absolute Count 0.00 0.00 - 1.00 x 1000/?L  Basophil Absolute Count 0.03 0.00 - 1.00 x 1000/?L  Absolute Immature Granulocyte Count 0.05 0.00 - 0.30 x 1000/?L  Absolute nRBC 0.00 0.00 - 1.00 x 1000/?L  ANC (Abs Neutrophil Count) 11.39 (H) 2.00 - 7.60 x 1000/?L No results found for this or any previous visit (from the past 26 weeks). XR Chest PA or APResult Date: 03/05/2024   No acute cardiothoracic abnormality. A radiopaque foreign body projecting over the right heart shadow likely external to the patient; recommend correlation with direct inspection. [place] [facility] Notify System Classification: Routine.  Report initiated by:  Reather Campbell, MD Reported and signed by: Mai Schwalbe, MD  [facility] XR Chest PA or APResult Date: 08/13/2023 No acute cardiothoracic abnormality. [place] [facility] Notify System Classification: Routine. Report initiated by:  Reather Campbell, MD Reported and signed by: Anselm Basset, MD  [facility] Radiology and Biomedical Imaging XR Chest PA or APResult  Date: 07/29/2023 Normal preoperative chest [facility] Notify System Classification: Routine. Reported and signed by: Larwance Poe, MD  [facility] and Biomedical Imaging   Results for orders placed or performed during the hospital encounter of 03/05/24 EKG Result Value Ref Range Status  Heart Rate 93 bpm Final  QRS Interval 77 ms Final  QT Interval 360 ms Final  QTC Interval 447 ms Final  P Axis 67 deg Final  QRS Axis -21 deg Final  T Wave Axis 69 deg Final  P-R Interval 150 msec Final  SEVERITY Abnormal ECG severity Final   Comment: :Sinus rhythm:Anterior infarct, BJY:[id number] Signed On 03-05-2024 8:06:33 EDT by  Paullette Boston  Results for orders placed or performed during the hospital encounter of 07/29/23 Echo 2D Complete w Doppler and CFI if Ind Image Enhancement 3D and or bubbles Result Value Ref Range  Reported Biplane EF% 64 %  Narrative   * Normal left ventricular size, wall thickness, systolic function and wall motion. The left ventricular ejection fraction calculated by biplane Simpson's was 64%.* Normal right ventricular cavity size and systolic function.  Estimated right ventricular systolic pressure is 65 mmHg compatible with pulmonary hypertension.* Visually the left atrium appears normal.  Right atrium is normal in size.* Trileaflet aortic valve.  No aortic stenosis.  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  ___________________________ Assessment & Recommendations83 y.o. female with PMHx of MDD, HLD, smoking (quit 1 month ago), IDA, pHTN (on TTE 07/2023, RVSP 65), neuropathy and prior right hip fracture s/p total hip arthroplasty (07/2023) admitted after a fall at home with down time >8hrs, found to have periprosthetic fracture of the right intertrochanteric femur, AKI and rhabdo. AKI and rhabdo now improving with aggressive fluid resuscitation. Planning for surgical fixation of fracture with ortho. The patient is at Moderate Risk for thisModerate Risk procedure.Estimated Risk of Major Adverse Cardiac EventsRevised Cardiac Index: 3.9% [place] Gillis: 0.2%NSQIP: 5% risk of serious complication, 5.1% risk of any complicationPatient is medically optimized for surgery, if Cr remains stable and CK continues to fall. Had TTE 07/2023 with normal LV size, function, normal RV size/function, pHTN (RVSP ), mild-moderate AR, mild TR, mild MR.To best optimize for surgery, specific perioperative recommendations include: Can take medications with sipsContinue the following: Wellbutrin , Cymbalta , Gabapentin , Crestor , Miralax Lanette Pipe DVT prophylaxis per protocolSmoking cessationMonitor pulmonary status and use nebulized albuterol, incentive spirometry as needed perioperatively#Periprosthetic fracture of the right intertrochanteric femur- Management per orthopedics team - proceed with OR as above #Rhabdo (resolving)#AKI (resolved)- S/p 4.5L IVF over the last 24hrs- CK 5276 -> 3465 -> 2638- Cr 1.6 -> 1.3 -> 0.9 - PM BMP- Can reduce fluid rate 300cc/hr -> 100cc/hr#Acute on chronic anemia- Hgb trending down since admission, although suspect largely dilution- Monitor for hematoma at site of fracture and send off hemolysis labs- Ensure active T&S, two large bore Ivs, transfuse hgb > 7- Repeat CBC with PM labs and collect iron  panel, LDH, hapto#MDD- Continue wellbutrin  and cumbalta #Neuropathy- Continue gabapentin #Asymptomatic bacteruria- Would monitor off abxsMary Kashius Dominic, MDHospitalist 5/1/20259:40 AM

## 2024-03-07 ENCOUNTER — Inpatient Hospital Stay: Admit: 2024-03-07 | Payer: Medicare (Managed Care) | Attending: Anesthesiology

## 2024-03-07 ENCOUNTER — Inpatient Hospital Stay: Admit: 2024-03-07 | Payer: Medicare (Managed Care)

## 2024-03-07 DIAGNOSIS — M9701XA Periprosthetic fracture around internal prosthetic right hip joint, initial encounter: Secondary | ICD-10-CM

## 2024-03-07 LAB — CBC WITHOUT DIFFERENTIAL
BKR WAM ANC (ABSOLUTE NEUTROPHIL COUNT): 3.25 x 1000/ÂµL (ref 2.00–7.60)
BKR WAM HEMATOCRIT (2 DEC): 21.2 % — ABNORMAL LOW (ref 35.00–45.00)
BKR WAM HEMOGLOBIN: 7 g/dL — ABNORMAL LOW (ref 11.7–15.5)
BKR WAM MCH (PG): 32.9 pg (ref 27.0–33.0)
BKR WAM MCHC: 33 g/dL (ref 31.0–36.0)
BKR WAM MCV: 99.5 fL (ref 80.0–100.0)
BKR WAM MPV: 10.7 fL (ref 8.0–12.0)
BKR WAM PLATELETS: 133 x1000/ÂµL — ABNORMAL LOW (ref 150–420)
BKR WAM RDW-CV: 13.7 % (ref 11.0–15.0)
BKR WAM RED BLOOD CELL COUNT.: 2.13 M/ÂµL — ABNORMAL LOW (ref 4.00–6.00)
BKR WAM WHITE BLOOD CELL COUNT: 5.1 x1000/ÂµL (ref 4.0–11.0)

## 2024-03-07 LAB — CBC WITH AUTO DIFFERENTIAL
BKR WAM ABSOLUTE IMMATURE GRANULOCYTES.: 0.02 x 1000/ÂµL (ref 0.00–0.30)
BKR WAM ABSOLUTE IMMATURE GRANULOCYTES.: 0.03 x 1000/ÂµL (ref 0.00–0.30)
BKR WAM ABSOLUTE LYMPHOCYTE COUNT.: 0.99 x 1000/ÂµL (ref 0.60–3.70)
BKR WAM ABSOLUTE LYMPHOCYTE COUNT.: 0.37 x 1000/ÂµL — ABNORMAL LOW (ref 0.60–3.70)
BKR WAM ABSOLUTE NRBC (2 DEC): 0 x 1000/ÂµL (ref 0.00–1.00)
BKR WAM ABSOLUTE NRBC (2 DEC): 0 x 1000/ÂµL (ref 0.00–1.00)
BKR WAM ANC (ABSOLUTE NEUTROPHIL COUNT): 4.98 x 1000/ÂµL (ref 2.00–7.60)
BKR WAM ANC (ABSOLUTE NEUTROPHIL COUNT): 6.34 x 1000/ÂµL (ref 2.00–7.60)
BKR WAM BASOPHIL ABSOLUTE COUNT.: 0.03 x 1000/ÂµL (ref 0.00–1.00)
BKR WAM BASOPHIL ABSOLUTE COUNT.: 0.01 x 1000/ÂµL (ref 0.00–1.00)
BKR WAM BASOPHILS: 0.5 % (ref 0.0–1.4)
BKR WAM BASOPHILS: 0.1 % (ref 0.0–1.4)
BKR WAM EOSINOPHIL ABSOLUTE COUNT.: 0.11 x 1000/ÂµL (ref 0.00–1.00)
BKR WAM EOSINOPHIL ABSOLUTE COUNT.: 0 x 1000/ÂµL (ref 0.00–1.00)
BKR WAM EOSINOPHILS: 1.7 % (ref 0.0–5.0)
BKR WAM EOSINOPHILS: 0 % (ref 0.0–5.0)
BKR WAM HEMATOCRIT (2 DEC): 29.5 % — ABNORMAL LOW (ref 35.00–45.00)
BKR WAM HEMATOCRIT (2 DEC): 27.3 % — ABNORMAL LOW (ref 35.00–45.00)
BKR WAM HEMOGLOBIN: 9.5 g/dL — ABNORMAL LOW (ref 11.7–15.5)
BKR WAM HEMOGLOBIN: 9.1 g/dL — ABNORMAL LOW (ref 11.7–15.5)
BKR WAM IMMATURE GRANULOCYTES: 0.3 % (ref 0.0–1.0)
BKR WAM IMMATURE GRANULOCYTES: 0.4 % (ref 0.0–1.0)
BKR WAM LYMPHOCYTES: 15.5 % — ABNORMAL LOW (ref 17.0–50.0)
BKR WAM LYMPHOCYTES: 5.3 % — ABNORMAL LOW (ref 17.0–50.0)
BKR WAM MCH (PG): 31.4 pg (ref 27.0–33.0)
BKR WAM MCH (PG): 32.2 pg (ref 27.0–33.0)
BKR WAM MCHC: 32.2 g/dL (ref 31.0–36.0)
BKR WAM MCHC: 33.3 g/dL (ref 31.0–36.0)
BKR WAM MCV: 97.4 fL (ref 80.0–100.0)
BKR WAM MCV: 96.5 fL (ref 80.0–100.0)
BKR WAM MONOCYTE ABSOLUTE COUNT.: 0.24 x 1000/ÂµL (ref 0.00–1.00)
BKR WAM MONOCYTE ABSOLUTE COUNT.: 0.19 x 1000/ÂµL (ref 0.00–1.00)
BKR WAM MONOCYTES: 3.8 % — ABNORMAL LOW (ref 4.0–12.0)
BKR WAM MONOCYTES: 2.7 % — ABNORMAL LOW (ref 4.0–12.0)
BKR WAM MPV: 10.9 fL (ref 8.0–12.0)
BKR WAM MPV: 10.9 fL (ref 8.0–12.0)
BKR WAM NEUTROPHILS: 78.2 % — ABNORMAL HIGH (ref 39.0–72.0)
BKR WAM NEUTROPHILS: 91.5 % — ABNORMAL HIGH (ref 39.0–72.0)
BKR WAM NUCLEATED RED BLOOD CELLS: 0 % (ref 0.0–1.0)
BKR WAM NUCLEATED RED BLOOD CELLS: 0 % (ref 0.0–1.0)
BKR WAM PLATELETS: 125 x1000/ÂµL — ABNORMAL LOW (ref 150–420)
BKR WAM PLATELETS: 145 x1000/ÂµL — ABNORMAL LOW (ref 150–420)
BKR WAM RDW-CV: 14.7 % (ref 11.0–15.0)
BKR WAM RDW-CV: 14.9 % (ref 11.0–15.0)
BKR WAM RED BLOOD CELL COUNT.: 3.03 M/ÂµL — ABNORMAL LOW (ref 4.00–6.00)
BKR WAM RED BLOOD CELL COUNT.: 2.83 M/ÂµL — ABNORMAL LOW (ref 4.00–6.00)
BKR WAM WHITE BLOOD CELL COUNT: 6.4 x1000/ÂµL (ref 4.0–11.0)
BKR WAM WHITE BLOOD CELL COUNT: 6.9 x1000/ÂµL (ref 4.0–11.0)

## 2024-03-07 LAB — BASIC METABOLIC PANEL
BKR ANION GAP: 11 (ref 7–17)
BKR ANION GAP: 9 (ref 7–17)
BKR ANION GAP: 10 (ref 7–17)
BKR BLOOD UREA NITROGEN: 17 mg/dL (ref 8–23)
BKR BLOOD UREA NITROGEN: 20 mg/dL (ref 8–23)
BKR BLOOD UREA NITROGEN: 18 mg/dL (ref 8–23)
BKR BUN / CREAT RATIO: 24.3 — ABNORMAL HIGH (ref 8.0–23.0)
BKR BUN / CREAT RATIO: 25 — ABNORMAL HIGH (ref 8.0–23.0)
BKR BUN / CREAT RATIO: 22.5 (ref 8.0–23.0)
BKR CALCIUM: 7.7 mg/dL — ABNORMAL LOW (ref 8.8–10.2)
BKR CALCIUM: 8 mg/dL — ABNORMAL LOW (ref 8.8–10.2)
BKR CALCIUM: 8.2 mg/dL — ABNORMAL LOW (ref 8.8–10.2)
BKR CHLORIDE: 110 mmol/L — ABNORMAL HIGH (ref 98–107)
BKR CHLORIDE: 111 mmol/L — ABNORMAL HIGH (ref 98–107)
BKR CHLORIDE: 107 mmol/L (ref 98–107)
BKR CO2: 19 mmol/L — ABNORMAL LOW (ref 20–30)
BKR CO2: 20 mmol/L (ref 20–30)
BKR CO2: 21 mmol/L (ref 20–30)
BKR CREATININE DELTA: -0.1
BKR CREATININE DELTA: 0
BKR CREATININE DELTA: 0.1
BKR CREATININE: 0.7 mg/dL (ref 0.40–1.30)
BKR CREATININE: 0.8 mg/dL (ref 0.40–1.30)
BKR CREATININE: 0.8 mg/dL (ref 0.40–1.30)
BKR EGFR, CREATININE (CKD-EPI 2021): >60 mL/min/{1.73_m2} (ref >=60)
BKR EGFR, CREATININE (CKD-EPI 2021): >60 mL/min/{1.73_m2} (ref >=60)
BKR EGFR, CREATININE (CKD-EPI 2021): >60 mL/min/{1.73_m2} (ref >=60)
BKR GLUCOSE: 95 mg/dL (ref 70–100)
BKR GLUCOSE: 96 mg/dL (ref 70–100)
BKR GLUCOSE: 139 mg/dL — ABNORMAL HIGH (ref 70–100)
BKR POTASSIUM: 4.1 mmol/L (ref 3.3–5.3)
BKR POTASSIUM: 5 mmol/L (ref 3.3–5.3)
BKR POTASSIUM: 5 mmol/L (ref 3.3–5.3)
BKR SODIUM: 140 mmol/L (ref 136–144)
BKR SODIUM: 140 mmol/L (ref 136–144)
BKR SODIUM: 138 mmol/L (ref 136–144)

## 2024-03-07 LAB — LIVER FUNCTION TESTS     (YH)
BKR ALANINE AMINOTRANSFERASE (ALT): 76 U/L — ABNORMAL HIGH (ref 10–35)
BKR ALKALINE PHOSPHATASE: 59 U/L (ref 9–122)
BKR ASPARTATE AMINOTRANSFERASE (AST): 119 U/L — ABNORMAL HIGH (ref 10–35)
BKR AST/ALT RATIO: 1.6
BKR BILIRUBIN TOTAL: 0.4 mg/dL (ref <=1.2)

## 2024-03-07 LAB — URIC ACID
BKR URIC ACID: 3.6 mg/dL (ref 2.7–7.3)
BKR URIC ACID: 3.1 mg/dL (ref 2.7–7.3)

## 2024-03-07 LAB — PHOSPHORUS     (BH GH L LMW YH)
BKR PHOSPHORUS: 2.3 mg/dL (ref 2.2–4.5)
BKR PHOSPHORUS: 2.9 mg/dL (ref 2.2–4.5)

## 2024-03-07 LAB — FERRITIN: BKR FERRITIN: 183 ng/mL — ABNORMAL HIGH (ref 13–150)

## 2024-03-07 LAB — CK     (BH GH L LMW YH)
BKR CREATINE KINASE TOTAL: 1345 U/L — ABNORMAL HIGH (ref 26–192)
BKR CREATINE KINASE TOTAL: 1419 U/L — ABNORMAL HIGH (ref 26–192)
BKR CREATINE KINASE TOTAL: 1457 U/L — ABNORMAL HIGH (ref 26–192)
BKR CREATINE KINASE TOTAL: 1432 U/L — ABNORMAL HIGH (ref 26–192)

## 2024-03-07 LAB — IRON AND TIBC
BKR IRON SATURATION: 25 % (ref 15–50)
BKR IRON: 59 ug/dL (ref 37–145)
BKR TOTAL IRON BINDING CAPACITY: 234 ug/dL — ABNORMAL LOW (ref 250–450)

## 2024-03-07 LAB — VITAMIN D 25 (VITAMIN D STATUS)(MINMETABLAB)(YH): VITAMIN D 25-HYDROXY: 51 ng/mL — ABNORMAL HIGH (ref 20–50)

## 2024-03-07 LAB — MAGNESIUM
BKR MAGNESIUM: 1.9 mg/dL (ref 1.7–2.4)
BKR MAGNESIUM: 1.9 mg/dL (ref 1.7–2.4)

## 2024-03-07 MED ORDER — DIPHENHYDRAMINE 50 MG/ML INJECTION (WRAPPED E-RX)
50 | INTRAVENOUS | Status: DC | PRN
Start: 2024-03-07 — End: 2024-03-07

## 2024-03-07 MED ORDER — SODIUM CHLORIDE 0.9 % IRRIGATION SOLUTION
0.9 | Status: CP | PRN
Start: 2024-03-07 — End: ?
  Administered 2024-03-07 (×3): 0.9 % irrigation

## 2024-03-07 MED ORDER — PROPOFOL 10 MG/ML INTRAVENOUS EMULSION
10 | INTRAVENOUS | Status: DC | PRN
Start: 2024-03-07 — End: 2024-03-07
  Administered 2024-03-07 (×2): 10 mL/h via INTRAVENOUS

## 2024-03-07 MED ORDER — ONDANSETRON HCL (PF) 4 MG/2 ML INJECTION SOLUTION
4 | INTRAVENOUS | Status: DC | PRN
Start: 2024-03-07 — End: 2024-03-07
  Administered 2024-03-07: 13:00:00 4 mg/2 mL via INTRAVENOUS

## 2024-03-07 MED ORDER — HYDROMORPHONE 2 MG/ML INJECTION SOLUTION
2 | INTRAVENOUS | Status: DC | PRN
Start: 2024-03-07 — End: 2024-03-07
  Administered 2024-03-07 (×2): 2 mg/mL via INTRAVENOUS

## 2024-03-07 MED ORDER — TOBRAMYCIN 40 MG/ML INJECTION SOLUTION
40 | INTRAVENOUS | Status: DC | PRN
Start: 2024-03-07 — End: 2024-03-07
  Administered 2024-03-07: 11:00:00 40 mg/mL via INTRAVENOUS

## 2024-03-07 MED ORDER — DEXAMETHASONE SODIUM PHOSPHATE (PF) 10 MG/ML INJECTION SOLUTION
10 | INTRAVENOUS | Status: DC | PRN
Start: 2024-03-07 — End: 2024-03-07
  Administered 2024-03-07: 12:00:00 10 mg/mL via INTRAVENOUS

## 2024-03-07 MED ORDER — TRANEXAMIC ACID 1,000 MG/10 ML (100 MG/ML) INTRAVENOUS SOLUTION
1000 | INTRAVENOUS | Status: DC | PRN
Start: 2024-03-07 — End: 2024-03-07
  Administered 2024-03-07 (×2): 1000 mg/10 mL (100 mg/mL) via INTRAVENOUS

## 2024-03-07 MED ORDER — ESMOLOL 100 MG/10 ML (10 MG/ML) INTRAVENOUS SOLUTION
100 | INTRAVENOUS | Status: DC | PRN
Start: 2024-03-07 — End: 2024-03-07
  Administered 2024-03-07: 11:00:00 100 mg/10 mL (10 mg/mL) via INTRAVENOUS

## 2024-03-07 MED ORDER — NALOXONE 0.4 MG/ML INJECTION SOLUTION
0.4 | INTRAVENOUS | Status: DC | PRN
Start: 2024-03-07 — End: 2024-03-07

## 2024-03-07 MED ORDER — PHENYLEPHRINE FOR ANESTHESIA
INTRAVENOUS | Status: DC | PRN
Start: 2024-03-07 — End: 2024-03-07
  Administered 2024-03-07: 11:00:00 250.000 mL/h via INTRAVENOUS

## 2024-03-07 MED ORDER — CEFAZOLIN IV PUSH 1 GRAM VIAL & 0.9% SODIUM CHLORIDE (ADULT)
Freq: Three times a day (TID) | INTRAVENOUS | Status: DC
Start: 2024-03-07 — End: 2024-03-07

## 2024-03-07 MED ORDER — SODIUM CHLORIDE 0.9 % INTRAVENOUS SOLUTION
INTRAVENOUS | Status: DC | PRN
Start: 2024-03-07 — End: 2024-03-07
  Administered 2024-03-07: 12:00:00 via INTRAVENOUS

## 2024-03-07 MED ORDER — CEFAZOLIN 1 GRAM SOLUTION FOR INJECTION
1 | INTRAVENOUS | Status: DC | PRN
Start: 2024-03-07 — End: 2024-03-07
  Administered 2024-03-07: 11:00:00 1 gram via INTRAVENOUS

## 2024-03-07 MED ORDER — ONDANSETRON HCL (PF) 4 MG/2 ML INJECTION SOLUTION
4 | INTRAVENOUS | Status: DC | PRN
Start: 2024-03-07 — End: 2024-03-07

## 2024-03-07 MED ORDER — PROPOFOL 10 MG/ML INTRAVENOUS EMULSION
10 | INTRAVENOUS | Status: DC | PRN
Start: 2024-03-07 — End: 2024-03-07
  Administered 2024-03-07 (×2): 10 mg/mL via INTRAVENOUS

## 2024-03-07 MED ORDER — TRANEXAMIC ACID 1,000 MG/10 ML (100 MG/ML) INTRAVENOUS SOLUTION
1000 | INTRAVENOUS | Status: DC | PRN
Start: 2024-03-07 — End: 2024-03-07

## 2024-03-07 MED ORDER — APIXABAN 2.5 MG TABLET
2.5 | Freq: Two times a day (BID) | ORAL | Status: DC
Start: 2024-03-07 — End: 2024-03-11
  Administered 2024-03-07 – 2024-03-10 (×6): 2.5 mg via ORAL

## 2024-03-07 MED ORDER — LACTATED RINGERS INTRAVENOUS SOLUTION
INTRAVENOUS | Status: DC | PRN
Start: 2024-03-07 — End: 2024-03-07
  Administered 2024-03-07: 11:00:00 via INTRAVENOUS

## 2024-03-07 MED ORDER — HYDROMORPHONE (PF) 0.2 MG/ML INJECTION SYRINGE
0.2 | INTRAVENOUS | Status: DC | PRN
Start: 2024-03-07 — End: 2024-03-07

## 2024-03-07 MED ORDER — SUGAMMADEX 100 MG/ML INTRAVENOUS SOLUTION
100 | INTRAVENOUS | Status: DC | PRN
Start: 2024-03-07 — End: 2024-03-07
  Administered 2024-03-07: 13:00:00 100 mg/mL via INTRAVENOUS

## 2024-03-07 MED ORDER — ROCURONIUM 10 MG/ML INTRAVENOUS SOLUTION
10 | INTRAVENOUS | Status: DC | PRN
Start: 2024-03-07 — End: 2024-03-07
  Administered 2024-03-07 (×2): 10 mg/mL via INTRAVENOUS

## 2024-03-07 MED ORDER — ACETAMINOPHEN 500 MG TABLET
500 | Freq: Four times a day (QID) | ORAL | Status: DC
Start: 2024-03-07 — End: 2024-03-07

## 2024-03-07 MED ORDER — BUPIVACAINE (PF) 0.25 % (2.5 MG/ML) INJECTION SOLUTION
0.25 | Status: DC | PRN
Start: 2024-03-07 — End: 2024-03-07
  Administered 2024-03-07: 13:00:00 0.25 % (2.5 mg/mL)

## 2024-03-07 MED ORDER — SUGAMMADEX 100 MG/ML INTRAVENOUS SOLUTION
100 | Status: CP
Start: 2024-03-07 — End: ?

## 2024-03-07 MED ORDER — HYDROMORPHONE 2 MG/ML INJECTION SOLUTION
2 | Status: CP
Start: 2024-03-07 — End: ?

## 2024-03-07 MED ORDER — KETOROLAC 30 MG/ML (1 ML) INJECTION SOLUTION
30 | Status: DC | PRN
Start: 2024-03-07 — End: 2024-03-07
  Administered 2024-03-07: 13:00:00 30 mg/mL (1 mL) via INTRAMUSCULAR

## 2024-03-07 MED ORDER — ESMOLOL 100 MG/10 ML (10 MG/ML) INTRAVENOUS SOLUTION
100 | INTRAVENOUS | Status: DC | PRN
Start: 2024-03-07 — End: 2024-03-07

## 2024-03-07 MED ORDER — QUETIAPINE IMMEDIATE RELEASE 25 MG TABLET
25 | Freq: Once | ORAL | Status: CP
Start: 2024-03-07 — End: ?
  Administered 2024-03-07: 02:00:00 25 mg via ORAL

## 2024-03-07 MED ORDER — HYDROMORPHONE 0.5 MG/0.5 ML INJECTION SYRINGE
0.5 | INTRAVENOUS | Status: DC | PRN
Start: 2024-03-07 — End: 2024-03-07

## 2024-03-07 MED ORDER — PHENYLEPHRINE 1 MG/10 ML (100 MCG/ML) IN 0.9 % SOD.CHLORIDE IV SYRINGE
1 | INTRAVENOUS | Status: DC | PRN
Start: 2024-03-07 — End: 2024-03-07
  Administered 2024-03-07: 12:00:00 1 mg/0 mL (00 mcg/mL) via INTRAVENOUS

## 2024-03-07 MED ORDER — LIDOCAINE (PF) 20 MG/ML (2 %) INTRAVENOUS SOLUTION
20 | INTRAVENOUS | Status: DC | PRN
Start: 2024-03-07 — End: 2024-03-07
  Administered 2024-03-07: 11:00:00 20 mg/mL (2 %) via INTRAVENOUS

## 2024-03-07 NOTE — Plan of Care
 Plan of Care Overview/ Patient StatusPatient is s/p admission for R hip fx on 4/30.  Patient is A&Ox 1, intermittently illogical and agitated.  Currently in restraints d/t hitting staff when care was being offered. Pain medication/Goal: Reported pain, but would not take any medications from Clinical research associate. Skin: Bruising over arms in various stages of healing. Dressing/incision: None. Neurovascular Checks: UTA d/t patients mentation.  PIV:  NS @ 150, PIV LUE  Activity: Bedrest. T&P Q2H. Oxygen: RA. Telemetry/Cont O2: None. Pertinent Labs: Refused labs from AM.  Was able to draw after patient restrained.  Diet: NPO. Bowel Status/LBM: 5/2. Voiding: Foley. High Risk Meds: None.  DVT Prophylaxis: SCD's.  Plan: OR today for R Hip Revision.  Call bell within reach.Patient left floor at ~1000 via bed.  Son at bedside. Awaiting patient to return to floor.Addendum 1600: Patient returned to floor via bed. Currently on 2L O2 NC. Purple foam pervena to R Hip. Continues on Q6H CK check.Sherran Dock, RN

## 2024-03-07 NOTE — Other
 PACU to Floor Nursing Transfer NotePreop Diagnosis: Periprosthetic fracture around internal prosthetic right hip joint, initial encounter (HC Code) [U98.01XA]Procedure Done: * * Right total hip revision and orif proximal femur - GENERAL Nerve Block: N/AAny Significant Events Intra-Op:  1u PRBC given Abnormal Assessment in PACU: noneLevel of Consciousness: lethargicLast Set of VS:  Vitals:  03/07/24 1445 03/07/24 1500 03/07/24 1515 03/07/24 1530 BP: 139/67 139/65 133/69 136/68 Pulse: 90 90 (!) 98 (!) 97 Resp: 14 16 19 20  Temp:     TempSrc:     SpO2: 99% 94% 96% 97% Device (Oxygen Therapy): nasal cannula O2 Flow (L/min): 2Baseline Neuro/developmental Status: WDLLabs Collected: YesSpecial Needs of the Patient:  Pravena to right hipAntibiotics: last dose given in OR: 2g Ancef  given at 1111, tobramycin 100mg  given at 1111IV Access: Periph IV 03/06/24 0330 upper arm right 20 gauge Nurse (Active) SiteCare/Dressing Status/Securement dressing dry and intact 03/07/24 0836 Date Dressing Applied/Changed 03/06/24 03/07/24 0836 Next Date Dressing Change 03/13/2024 03/07/24 0836 Lumen 1 Patency/Care Patent;flushed w/o difficulty 03/07/24 1400 Site Signs asymptomatic without redness, warmth, swelling, pain, palpable cord, streak formation, drainage, dressing intact 03/07/24 1400 Phlebitis 0-->no symptoms 03/07/24 1400 Infiltration/Extravasation Assessment 0-->no symptoms 03/07/24 1400 Patient Education Instructed to keep IV site dry;Instructed to call nurse if fluid leaking from site;Instructed to call nurse if site is painful,red,swollen, burning 03/07/24 0836 Daily Review of Necessity ** completed 03/07/24 1400   Periph IV 03/07/24 1109 dorsum left arm over-the-needle catheter system 18 gauge 1.25  in length Anesthesiologist (Active) Lumen 1 Patency/Care flushed w/o difficulty;Patent;blood return present 03/07/24 1400 Site Signs asymptomatic without redness, warmth, swelling, pain, palpable cord, streak formation, drainage, dressing intact 03/07/24 1400 Phlebitis 0-->no symptoms 03/07/24 1400 Infiltration/Extravasation Assessment 0-->no symptoms 03/07/24 1400 Daily Review of Necessity ** completed 03/07/24 1400 IV Fluids:  sodium chloride  150 mL/hr (03/06/24 2002) Pain Assessment: Number Scale (0-10) 03/05/24 2150 Right proximal leg-Pain Rating (0-10): Rest: 3Pain Assessment: Number Scale (0-10) 03/05/24 2150 Right proximal leg-Pain Rating (0-10): Activity: 5Number Scale Pain Rating 0: 0-->no painPain Assessment: Number Scale (0-10) 03/05/24 2150 Right proximal leg-Pharmaceutical Interventions: complementary/alternative therapy utilized, pain care plan reviewed with patient/caregiver Wound 03/07/24 1140 Incision Right hip-Incision Closure: unable to assess  Body Position: supine, head elevatedHead of Bed (HOB) Positioning: HOB at 60-90 degrees   Time of Last Void or Time that Urinary Catheter was Removed Intra-Op: catheter in place Additional Info: Pt arrived to PACU with right hip dressing and Pravena.  Pravena with no noted drainange.  2+/2+ pedal pulses.  Arousable to speech. Following commands. Able to move Rigth foot with moderate strength. No complaints of pain.  Received 1u PRBC in OR. Labs sent. Foley draining clear yellow urine. Vitals stabloe.Contact RN (name and phone number): Genora Kidd, RN [phone]

## 2024-03-07 NOTE — Discharge Instructions
 Discharge Instructions  Your Surgeon was: Dr. Charm Coombs, DOYour Procedure was:  Right total hip revision and orif proximal femur Date of Surgery: 5/2/2025Follow-Up Appointment: ***General Instructions: -Please call if there are any questions or concerns. -Call if you develop a fever, worsening pain, redness, numbness, weakness, coolness, bleeding, or swelling to surgical site or extremity. -Do not hesitate to go to the Emergency Department if you are concerned.Diet: You are able to resume your normal diet.  Activity: -You are to remain weight-bearing as tolerated to the right lower extremity. -Ambulate with assistive devices as needed.  -Continue to follow all precautions as instructed by physical therapy.  -We recommend that you are out of bed for all meals and participate in daily physical therapy with ambulation program.  Wound Care: Prevena incisional vac to remain in place until battery no longer works (roughly 7 days). Once battery no longer functioning you may remove and replace with a dry sterile dressing. Continue the dry sterile dressing and change daily or more frequently if drainage noted. Once your incision is completely dry for 24 to 48 hours it may be left open to air.  If you have sutures or staples to be removed, those can be removed by the home health service or your surgeon at 14 days after surgery.  You may shower with the occlusive dressing and allow water to run over the dressing, however, please do not submerge incision in a bathtub, pool, or hot tub.  Do not put any cream, lotion or ointment on incision(s) until you rpost-op visit at 6 weeks. Blood Clot Prevention: Please continue Eliquis  as directed for a total of 42 days post-operatively to prevent blood clots. Pain Management: -You may be prescribed a pain medication such as oxycodone , dilaudid , or tramadol as needed for pain.-Tylenol  is taken four times a day to reduce the need for narcotic pain medicines.  Do not exceed 4,000mg  per day.Bowel Management: - Begin the prescription of Senna as directed to prevent constipation- Monitor for positive + flatus as daily indicator for return of bowel function- If no bowel movement for 3 days, take over-the-counter MiraLax  1 capful by mouth daily with hydration- If no bowel movement for 5 days, take over-the-counter move milk of magnesia and/or fleets enema- If no bowel movement for 7 days or having abdominal pain, proceed to the emergency department for x-ray- If you develop diarrhea, stop taking stool softeners and laxatives NARCOTIC SAFETY INSTRUCTIONS You are being prescribed narcotic pain medication as part of your discharge regimen. Please exercise caution when using these medications and only use as directed by your physician. Overdose of narcotics can be fatal. Signs of narcotic overdose include:- Decreased consciousness- Pinpoint pupils- Slowed breathing ratePlease instruct your friends and family to be aware of these warning signs. If you believe you or someone you know has taken too many pills, please contact a physician immediately or call 911. Narcotic pain medication can also cause constipation. Please take a stool softener while you are taking narcotics and remember to keep yourself well hydrated.  NARCOTIC DISPOSAL INSTRUCTIONSHaving excess narcotic pain medication at home can be a safety issue for you and those around you. We encourage you to dispose of any additional narcotic medications after your surgical pain subsides, which typically occurs 3-5 days after your operation. Narcotic pain medicine should never be saved for future use. To properly dispose of excess narcotics at home you can simply flush the medication down the toilet. Alternatively, a more environmentally friendly option is to drop  off your additional narcotic medication at a participating local police department. In [facility], you may drop off your medications at the [facility] Department located at [address], [place], [place] [address]. For the address of the participating police station nearest you, please visit: [email or web address] Future Appointments: Future Appointments Date Time Provider Department Center 03/10/2024  5:30 PM [facility] [place] 04/16/2024  2:10 PM Margeret Sheer, MD DERM E MAIN [facility] 08/05/2024 11:45 AM Marla Sills, MD ORTHO&REHAB [facility]

## 2024-03-07 NOTE — Plan of Care
 Plan of Care Overview/ Patient StatusPatient is A&O x 2, confused at times, disoriented to time and place.  Denies chest pain, SOB, calf tenderness, numbness/tingling, and N/V.  Vitals are stable on RA.  IS follow-up instruction provided, patient unable to perform d/t cognition.  Pulses +2 bilaterally.  Dorsiplantar flexion RLE weak.  Lungs clear to ausculation.  Scattered bruising, otherwise skin intact.  Pain controlled with Tylenol  and Oxycodone  2.5.  SCDs in use bilaterally.  Foley in use.  Bedrest.  Turn and reposition Q 2 hours.  Heparin  for anticoagulation.  LBM 03/04/24.  Clear liquid diet since 0200, tolerating well.  Will be NPO at 0900 for OR.  IV CDI, +flush.  IVF running per [facility]  CHG wipe x 1 given.  Bed alarm on and audible, safety and comfort measures maintained, call bell and personal items within reach.  Plan: OR 03/07/24 for right hip revisionSarah A Enio Hornback, RNAddendum: 0000 - Patient refusing labs and midnight vitals, covering provider notified.Jerone Moorman, RN

## 2024-03-07 NOTE — Anesthesia Post-Procedure Evaluation
 Anesthesia Post-op NotePatient: Holly RalstonProcedure(s):  Procedure(s) (LRB):Right total hip revision and orif proximal femur (Right) Last Vitals:  I have reviewed the post-operative vital signs as noted in the Epic chart.POSTOP EVALUATION:      Patient Recovery Location:  PACU     Vital Signs Status:  Stable     Patient Participation:  Patient participated     Mental Status:  Awake     Respiratory Status:  Acceptable     Airway Patency:  Patent     Cardiovascular/Hydration Status:  Stable     Pain Management:  Satisfactory to patient     Nausea/Vomiting Status:  Satisfactory to patientThere were no known notable events for this encounter.

## 2024-03-07 NOTE — Other
 [facility]	 Medicine ConsultConsult reason: rhabdo, AKI, pre-operative medical assessmentSubjective Patient agitated this morning - appears delirious. Denies any pain. Remains AAOx3, but irritable with simple questions (pleasant and conversive yesterday). Reportedly combative with nursing. Denies abdominal pain, back pain.Objective Vitals:BP (!) 150/60  - Pulse (!) 102  - Temp 98.2 ?F (36.8 ?C) (Oral)  - Resp 20  - SpO2 96% Physical Exam:General - no acute distressCardiovasclar - S1, S2, Regular rate & rhythm, No murmurs, rubs, or gallops, No lower extremity edemaPulmonary - No respiratory distress, Clear to auscultation bilaterallyGastrointestinal - Bowel sounds present, Nondistended, Soft, NontenderNeurological - Remains AAOx3, but irritable with simple questions (pleasant and conversive yesterday). Reportedly combative with nursing. Moving all extremities. Sensation grossly intact.Musculoskeletal - RLE immobilizedLabs: -BMP: Cr 1.6 -> 1.3 -> 0.8 -> 0.8, K 4.1, Bicarb 21 -> 19 -> 19-LFTs: AST 154 -> , ALT 83 -> , bilis WNL-CK: 5276 -> 3465 -> 2361 -> 1419-CBC: WBC 14.2 -> 6.6 -> 5.1, Hgb 9.1 -> 7.1 -> 7, Plts 141 -> 121 -> 133    Imaging:  No new imagingAssessment 83 y.o. 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 AKI and rhabdo now improving with aggressive fluid resuscitation. Planning for surgical fixation of fracture with ortho. Plan #Periprosthetic fracture of the right intertrochanteric femur- Management per orthopedics team - proceed with OR as above  #Rhabdo (resolving)#AKI (resolved)- Trend BMP and CK BID- Please add-on or repeat LFTs with next lab draw- Maintain K > 4, Mg > 2, Phos > 2.5- Can reduce fluids to 100cc/hr - likely stop once tolerating PO post-op#Delirium- Patient reported to me delirium during prior hospitalization 5/1. This morning, patient showing evidence of hospital acquired delirium. No FND.- Avoid benzos, opiates, H2-blockers and anti-cholinergics given risk of precipitating delirium and disinhibition- Continue to monitor for changes in mental status and delirium precautions- Please refer to delirium pathway for guidance: can utilize seroquel 12.5mg  q6hrs PRN for mild agitation or haldol 0.5mg  q6hrs IM PRN for severe - Ensure daily bowel movements - last documented is 4/29---Uptitrate bowel regimen: continue miralax /senna BID, can add dulcolax, milk of magnesium  or lactulose - TOV tomorrow and remove other unnecessary lines/tubes#Acute on chronic anemia- Hgb trending down since admission, although suspect largely dilution- Monitor for hematoma at site of fracture- Hemolysis labs negative- Please add-on iron  studies to labs prior to blood- Ensure active T&S, two large bore Ivs, transfuse hgb > 7- Repeat CBC with PM labs  #MDD- Continue wellbutrin  and cumbalta  #Neuropathy- Continue gabapentin  #Asymptomatic bacteruria- Would monitor off abxs-------------------------------------------------------------------------------------Pre-operative medical assessment (see full POMA 5/1)The patient is at Moderate Risk for thisModerate Risk procedure.Note last TTE with evidence of pHTN (RVSP ). Euvolemic at this time,.RCRI Score: 0 points (Class I risk) is associated with a 3.9% risk of major perioperative cardiac events (low risk)Gupta: 0.2%NSQIP: 5% risk of serious complication, 5.1% risk of any complication Medical decision making for this patient was moderate.Signed:Royden Corin Venecia Mehl, MDHospitalist Attending Physician5/12/2023

## 2024-03-07 NOTE — Other
 Operative Diagnosis:Pre-op:   Periprosthetic fracture around internal prosthetic right hip joint, initial encounter (HC Code) [L24.01XA] Patient Coded Diagnosis   Pre-op diagnosis: Periprosthetic fracture around internal prosthetic right hip joint, initial encounter (HC Code)  Post-op diagnosis: Periprosthetic fracture around internal prosthetic right hip joint, initial encounter (HC Code)  Patient Diagnosis   Pre-op diagnosis: Periprosthetic fracture around internal prosthetic right hip joint, initial encounter (HC Code) [M01.01XA]  Post-op diagnosis:     Post-op diagnosis:   * Periprosthetic fracture around internal prosthetic right hip joint, initial encounter (HC Code) [U27.01XA]Operative Procedure(s) :Procedure(s) (LRB):Right total hip revision and orif proximal femur (Right)Post-op Procedure & Diagnosis ConfirmationPost-op Diagnosis: Post-op Diagnosis confirmed (no changes)Post-op Procedure: Post-op Procedure confirmed (no changes)

## 2024-03-07 NOTE — Brief Op Note
 [facility] HealthPatient Name: [facility]        [id number] Patient DOB: 28-Apr-1942Surgery Date: 5/2/2025Surgeons and Role:   * Charm Coombs, DO - PrimaryAssistant(s):Physician Assistant: Elvis Hamming, PAStaff:  Circulator: Zelma Hidden, RNRelief Circulator: Margrette Shield, RNRelief Scrub: Adrain Alar CScrub Person: Hildy Lowers, Gene Sr.Pre-Op Diagnosis: Periprosthetic fracture around internal prosthetic right hip joint, initial encounter (HC Code) [W29.01XA] Procedure(s) and Anesthesia Type:   * Right total hip revision and orif proximal femur - GENERALOperative Findings (enter relevant operative findings; do not refer to an operative report that is not yet transcribed): periprosthetic fracture of right hip Signs of infection present at the time of surgery at the operative site: None Blood and Blood Products: none                 Drains:  noneImplants: Implant Name Type Inv. Item Serial No. Manufacturer Lot No. LRB No. Used Action CABLE 1.7X750MM W CRMP - [id number] Implant CABLE 1.7X750MM W CRMP  Jesus Morones [employer] [id number] Right 1 Implanted STEM FEM S11 STD CLLR CORAIL - [id number] Implant STEM FEM S11 STD CLLR Burtis Case [employer] [id number] Right 1 Explanted ARTICUL/EZE FEMORAL HEAD CERAMIC 12/14 TAPER 40mm plus 5 Implant   [employer] [id number] Right 1 Explanted CABLE 1.7X750MM W CRMP - [id number] Implant CABLE 1.7X750MM W CRMP  Jesus Morones [employer] [id number] Right 1 Implanted Reclaim Monobloc Revision Femoral Stem 14 Standard Stem High Offset Implant    [id number] Right 1 Replacement Device HEAD FEMORAL HIP - OFFSET 12/14 TPR ARTICUL EZE - [id number] Implant HEAD FEMORAL HIP - OFFSET 12/14 TPR ARTICUL EZE  J [employer] [id number] Right 1 Replacement Device  Specimens: * No specimens in log * Intra-op Labs (16h ago, onward)   Start     Ordered  03/07/24 1203  Tissue culture     ([facility])  [[id number]]  Once      Question Answer Comment Site: Right  Site Details:  right femur Lab Specific Advisory Note: DO NOT PLACE ORDERS IN COMMENT FIELD  Release to patient Immediate    03/07/24 1202  03/07/24 1200  Prosthetic joint culture (sonication)     [facility])  [[id number]]  Once      Comments: Right hip explanted hardware Question Answer Comment Release to patient Immediate  Site: Right  Site Details: Hip    03/07/24 1200    EBL: 800Impression and plan:Holly Erickson is 83 year old female who is status post revision right total hip arthroplasty with Dr. Serina Dane on 03/07/2024. Patient received one unit pRBC intra op. Patient was transferred to the PACU in stable condition at this time.Admit to OrthoActivity: WBAT RLEImaging: Intra op fluoroPACU labs: CBC and then AM labs ABX: peri op abxTXA: intra op DVT ppx: Eliquis  2.5 mg BID x 42 days post op, SCDsPain control: PO/IVDressing: Prevena wound vacSuture/staples: Monocryl Diet: As toleratedFoley: Foley in placeBowel regimen: encouragedHome meds: continueDispo: pending PT/OT eval, medical clearance  Elvis Hamming, PA5/2/20252:16 PMNo qualified resident attestation: I understand that 1842(b)(7)(D) of the Act generally prohibits Medicare physician fee schedule payment for the services of assistants at surgery in teaching hospitals when qualified residents are available to furnish such services. I certify that the services for which payment is claimed were medically necessary and that NO QUALIFIED RESIDENT WAS AVAILABLE TO PERFORM THE SERVICES. I further understand that these services are subject to post-payment review by the Medicare carrier.

## 2024-03-07 NOTE — Anesthesia Pre-Procedure Evaluation
 This is a 83 y.o. female scheduled for Right total hip revision (Right).Review of Systems/ Medical HistoryAnesthesia Evaluation:   Estimated body mass index.10/23/23 : 21.48 kg/m? Last patient weight recorded. 09/07/23 : 49.9 kg Last patient height recorded. 10/23/23 : 5' (1.524 m) CC/HPI: Past Medical History:No date: ArrhythmiaNo date: Colon polypNo date: ConstipationNo date: DepressionNo date: HyperlipidemiaPast Surgical History:  Past Surgical History:No date: APPENDECTOMYNo date: BACK SURGERY05/04/2018: COLONOSCOPY    [facility]  [facility]: H[facility]IA REPA[facility]No date: HYSTERECTOMYNo date: OOPHORECTOMY; BilateralNo date: PARATHYROIDECTOMYNo date: REPLACEMENT TOTAL HIP W/  RESURFACING IMPLANTS; LeftNo date: REPLACEMENT TOTAL KNEE BILATERALNo date: RHINOPLASTYNo date: ROTATOR CUFF REPAIR; RightNo date: TONSILLECTOMYCardiovascular: -Other Cardiovascular:   Echo 07/2023* Normal left ventricular size, wall thickness, systolic function and wall motion. The left ventricular ejection fraction calculated by biplane Simpson's was 64%.* Normal right ventricular cavity size and systolic function.  Estimated right ventricular systolic pressure is 65 mmHg compatible with pulmonary hypertension.* Visually the left atrium appears normal.  Right atrium is normal in size.* Trileaflet aortic valve.  No aortic stenosis.  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 Skeletal/Skin:  -Joint and Skeletal Disorders: arthritis and bone fractureGastrointestinal/Genitourinary: -GI Comments: Ongoing UTIHematological/Lymphatic: -Anemia: Patient has anemia (Hb 7.6).  Additional Findings: Hb 7.6Physical ExamCardiovascular:    normal exam  Pulmonary:  normal exam  Airway:  Mallampati: IITM distance: >3 FBNeck ROM: fullMouth Opening: >3cmDental:  unremarkable  Dentition: edentulousAnesthesia PlanASA 3 The primary anesthesia plan is  general. Anesthesia informed consent obtained. Consent obtained from: patientUse of blood products: consented  Consent Comment: Consented with family at bedside.  Patient aware of anesthesia plan for general anesthesia.  Patient and family questions answered.The post operative pain plan is IV analgesics.Plan discussed with Attending and CRNA.Anesthesiologist's Pre Op NoteI personally evaluated and examined the patient prior to the intra-operative phase of care on the day of the procedure.Aaron Aas

## 2024-03-07 NOTE — Other
 Post Anesthesia Transfer of Care NotePatient: Holly RalstonProcedure(s) Performed: Procedure(s) (LRB):Right total hip revision and orif proximal femur (Right)Last Vitals: I have reviewed the post-operative vital signs during the handoff as noted in the Epic chart.POSTOP HANDOFF :      Patient Location:  PACU     Level of Consciousness:  Sedated and responds to stimulation     VS stable since last recorded intra-op set? Yes       Oxygen source: maskPatient co-morbidities, intra-operative course, intake & output and antibiotics as per Anesthesia record were discussed with the RN.

## 2024-03-07 NOTE — Progress Notes
 Orthopedic Progress NoteLynn Erickson is a 83 y.o. female with hx of bilateral THA, most recently right THA with Dr. Kendra Pavy on 07/30/23 who sustained a right periprosthetic hip fracture s/p mechanical fall. Subjective:Patient seen and examined in bed this AM. Patient delirious, disoriented, and combative this AM. Patient placed in soft restraints this AM. Patient given a dose of Seroquel and Trazadone overnight.  Patient has been NPO after midnight for planned surgical procedure.  Given altered mental status, not appropriately answering questions. Vitals:BP Readings from Last 1 Encounters: 03/07/24 (!) 150/60  Pulse Readings from Last 1 Encounters: 03/07/24 (!) 102  Resp Readings from Last 1 Encounters: 03/07/24 20  Temp Readings from Last 1 Encounters: 03/07/24 98.2 ?F (36.8 ?C) (Oral)  SpO2 Readings from Last 1 Encounters: 03/07/24 96% Physical Exam:General: Alert and comfortableRLE:         	  Exam limited given altered mental status.               Patient moving all 4 extremities spontaneously              Skin intact              Thigh soft and compressible               Foot and toes warm and well perfused               Abdomen soft            Labs:Lab Results Component Value Date  WBC 6.6 03/06/2024  HGB 7.1 (L) 03/06/2024  HCT 21.80 (L) 03/06/2024  MCV 99.5 03/06/2024  PLT 121 (L) 03/06/2024 Lab Results Component Value Date  BUN 30 (H) 03/06/2024 Lab Results Component Value Date  CREATININE 0.80 03/06/2024 Lab Results Component Value Date  NA 140 03/06/2024  K 4.4 03/06/2024  CL 110 (H) 03/06/2024  CO2 21 03/06/2024 Lab Results Component Value Date  INR 1.08 03/05/2024  INR 1.17 (H) 08/13/2023  INR 1.18 (H) 07/29/2023 Assessment:Holly Erickson is a 83 y.o. female with hx of bilateral THA, most recently right THA with Dr. Kendra Pavy on 07/30/23 who sustained a right periprosthetic hip fracture s/p mechanical fall. Pending surgical fixation. Plan:1) Maintain bedrest- pending OR today 2) Continue pain medications prn 3) VTE prophylaxis: HSQ, SCD's4) NPO until post-op5) OR is booked and informed consented obtained6) HG 7.0 this AM- will likely need blood transfusion. Electronically Signed by Stephani Ege, PA, May 2, 2025Pager [phone]

## 2024-03-07 NOTE — Other
 Orthopedic Post-Op Check: Procedure: Right total hip revision and orif proximal femurSurgeon: Dr. Serina Dane Anesthesia: GeneralSubjective: The patient is a 83 y.o. female.  Patient seen and examined in bed. Son at bedside. Patient resting comfortably. Pleasant but disoriented to time and place.  She is able to follow commands. No complaints of pain.   She denies chest pain or shortness of breath.  She also denies nausea, vomiting, or abdominal pain.Vitals:BP Readings from Last 1 Encounters: 03/07/24 133/75  Pulse Readings from Last 1 Encounters: 03/07/24 90  Resp Readings from Last 1 Encounters: 03/07/24 18  Temp Readings from Last 1 Encounters: 03/07/24 97.3 ?F (36.3 ?C) (Oral)  SpO2 Readings from Last 1 Encounters: 03/07/24 97%  Physical Exam:General: Appears comfortable. Pleasant but disoriented to time and place. RLE:                Prevena vac in place and holding suction          Distal sensation is grossly intact to light touch        Calves are soft and non-tender        Foot and toes warm and well perfused.         Intact tibialis anterior, extensor hallucis longus, flexor hallucis longus, and gastrocnemius Labs:Lab Results Component Value Date  WBC 6.4 03/07/2024  HGB 9.5 (L) 03/07/2024  HCT 29.50 (L) 03/07/2024  MCV 97.4 03/07/2024  PLT 125 (L) 03/07/2024 Lab Results Component Value Date  CREATININE 0.70 03/07/2024  BUN 17 03/07/2024  NA 140 03/07/2024  K 5.0 03/07/2024  CL 111 (H) 03/07/2024  CO2 20 03/07/2024 Assesment: Post-Op Day #0 status post Right total hip revision and orif proximal femurStablePlan: 1) Antibiotics: Ceftriaxone  for UTI2) Weight-bearing status: WBAT RLE with physical therapy 3) VTE Prophylaxis:  Eliquis  2.5 mg BID x 42 days post op , compressive devices4) Continue home medications5) Pain control- PO PRN 6) Diet as tolerated7) Foley in place 8) Bowel regimen9) Maintain Prevena Vac 10)Patient transfused intra op- CBC, BMP, CK post op and in AM. 11) Medicine following- appreciate recsElectronically Signed by Stephani Ege, PA, May 2, 2025Pager [phone]

## 2024-03-08 ENCOUNTER — Encounter: Admit: 2024-03-08 | Payer: PRIVATE HEALTH INSURANCE | Primary: Internal Medicine

## 2024-03-08 DIAGNOSIS — F32A Depression: Secondary | ICD-10-CM

## 2024-03-08 DIAGNOSIS — K5900 Constipation, unspecified: Secondary | ICD-10-CM

## 2024-03-08 DIAGNOSIS — E785 Hyperlipidemia, unspecified: Secondary | ICD-10-CM

## 2024-03-08 DIAGNOSIS — K635 Polyp of colon: Secondary | ICD-10-CM

## 2024-03-08 DIAGNOSIS — I499 Cardiac arrhythmia, unspecified: Secondary | ICD-10-CM

## 2024-03-08 LAB — BASIC METABOLIC PANEL
BKR ANION GAP: 13 (ref 7–17)
BKR BLOOD UREA NITROGEN: 20 mg/dL (ref 8–23)
BKR BUN / CREAT RATIO: 25 — ABNORMAL HIGH (ref 8.0–23.0)
BKR CALCIUM: 8 mg/dL — ABNORMAL LOW (ref 8.8–10.2)
BKR CHLORIDE: 111 mmol/L — ABNORMAL HIGH (ref 98–107)
BKR CO2: 17 mmol/L — ABNORMAL LOW (ref 20–30)
BKR CREATININE DELTA: 0
BKR CREATININE: 0.8 mg/dL (ref 0.40–1.30)
BKR EGFR, CREATININE (CKD-EPI 2021): >60 mL/min/{1.73_m2} (ref >=60)
BKR GLUCOSE: 123 mg/dL — ABNORMAL HIGH (ref 70–100)
BKR POTASSIUM: 5.2 mmol/L (ref 3.3–5.3)
BKR SODIUM: 141 mmol/L (ref 136–144)

## 2024-03-08 LAB — CBC WITH AUTO DIFFERENTIAL
BKR WAM ABSOLUTE IMMATURE GRANULOCYTES.: 0.02 x 1000/ÂµL (ref 0.00–0.30)
BKR WAM ABSOLUTE LYMPHOCYTE COUNT.: 0.75 x 1000/ÂµL (ref 0.60–3.70)
BKR WAM ABSOLUTE NRBC (2 DEC): 0 x 1000/ÂµL (ref 0.00–1.00)
BKR WAM ANC (ABSOLUTE NEUTROPHIL COUNT): 6.05 x 1000/ÂµL (ref 2.00–7.60)
BKR WAM BASOPHIL ABSOLUTE COUNT.: 0 x 1000/ÂµL (ref 0.00–1.00)
BKR WAM BASOPHILS: 0 % (ref 0.0–1.4)
BKR WAM EOSINOPHIL ABSOLUTE COUNT.: 0 x 1000/ÂµL (ref 0.00–1.00)
BKR WAM EOSINOPHILS: 0 % (ref 0.0–5.0)
BKR WAM HEMATOCRIT (2 DEC): 23.9 % — ABNORMAL LOW (ref 35.00–45.00)
BKR WAM HEMOGLOBIN: 8.1 g/dL — ABNORMAL LOW (ref 11.7–15.5)
BKR WAM IMMATURE GRANULOCYTES: 0.3 % (ref 0.0–1.0)
BKR WAM LYMPHOCYTES: 10.1 % — ABNORMAL LOW (ref 17.0–50.0)
BKR WAM MCH (PG): 32.1 pg (ref 27.0–33.0)
BKR WAM MCHC: 33.9 g/dL (ref 31.0–36.0)
BKR WAM MCV: 94.8 fL (ref 80.0–100.0)
BKR WAM MONOCYTE ABSOLUTE COUNT.: 0.62 x 1000/ÂµL (ref 0.00–1.00)
BKR WAM MONOCYTES: 8.3 % (ref 4.0–12.0)
BKR WAM MPV: 11.4 fL (ref 8.0–12.0)
BKR WAM NEUTROPHILS: 81.3 % — ABNORMAL HIGH (ref 39.0–72.0)
BKR WAM NUCLEATED RED BLOOD CELLS: 0 % (ref 0.0–1.0)
BKR WAM PLATELETS: 160 x1000/ÂµL (ref 150–420)
BKR WAM RDW-CV: 15 % (ref 11.0–15.0)
BKR WAM RED BLOOD CELL COUNT.: 2.52 M/ÂµL — ABNORMAL LOW (ref 4.00–6.00)
BKR WAM WHITE BLOOD CELL COUNT: 7.4 x1000/ÂµL (ref 4.0–11.0)

## 2024-03-08 LAB — VITAMIN B12: BKR VITAMIN B12: 668 pg/mL (ref 232–1245)

## 2024-03-08 LAB — LIVER FUNCTION TESTS WITH ALBUMIN     (YH)
BKR ALANINE AMINOTRANSFERASE (ALT): 57 U/L — ABNORMAL HIGH (ref 10–35)
BKR ALBUMIN: 2.9 g/dL — ABNORMAL LOW (ref 3.6–5.1)
BKR ALKALINE PHOSPHATASE: 52 U/L (ref 9–122)
BKR ASPARTATE AMINOTRANSFERASE (AST): 77 U/L — ABNORMAL HIGH (ref 10–35)
BKR AST/ALT RATIO: 1.4
BKR BILIRUBIN TOTAL: 0.3 mg/dL (ref <=1.2)

## 2024-03-08 LAB — CK     (BH GH L LMW YH): BKR CREATINE KINASE TOTAL: 1020 U/L — ABNORMAL HIGH (ref 26–192)

## 2024-03-08 LAB — URIC ACID: BKR URIC ACID: 3.3 mg/dL (ref 2.7–7.3)

## 2024-03-08 LAB — PHOSPHORUS     (BH GH L LMW YH): BKR PHOSPHORUS: 3.8 mg/dL (ref 2.2–4.5)

## 2024-03-08 LAB — MAGNESIUM: BKR MAGNESIUM: 2 mg/dL (ref 1.7–2.4)

## 2024-03-08 MED ORDER — OXYCODONE IMMEDIATE RELEASE 5 MG TABLET
5 | ORAL | Status: DC | PRN
Start: 2024-03-08 — End: 2024-03-11
  Administered 2024-03-10: 13:00:00 5 mg via ORAL

## 2024-03-08 MED ORDER — OXYCODONE (ROXICODONE) IMMEDIATE RELEASE 2.5 MG HALFTAB
2.5 | ORAL | Status: DC | PRN
Start: 2024-03-08 — End: 2024-03-11
  Administered 2024-03-09 – 2024-03-10 (×2): 2.5 mg via ORAL

## 2024-03-08 NOTE — ACP (Advance Care Planning)
 Saw pt at bedside. She was accompanied by son TodDiscussed pts code status and she confirmed that she would like to be made a DNR/DNI. Son Oda Bence stated that she has living will to this affect and has long been her wish.

## 2024-03-08 NOTE — Plan of Care
 Plan of Care Overview/ Patient StatusProblem: Adult Inpatient Plan of CareGoal: Plan of Care ReviewOutcome: Interventions implemented as appropriateGoal: Patient-Specific Goal (Individualized)Outcome: Interventions implemented as appropriateGoal: Absence of Hospital-Acquired Illness or InjuryOutcome: Interventions implemented as appropriateGoal: Optimal Comfort and WellbeingOutcome: Interventions implemented as appropriateGoal: Readiness for Transition of CareOutcome: Interventions implemented as appropriate Problem: Fall Injury RiskGoal: Absence of Fall and Fall-Related InjuryOutcome: Interventions implemented as appropriate Problem: WoundGoal: Optimal CopingOutcome: Interventions implemented as appropriateGoal: Optimal Functional AbilityOutcome: Interventions implemented as appropriateGoal: Absence of Infection Signs and SymptomsOutcome: Interventions implemented as appropriateGoal: Improved Oral IntakeOutcome: Interventions implemented as appropriateGoal: Optimal Pain Control and FunctionOutcome: Interventions implemented as appropriateGoal: Skin Health and IntegrityOutcome: Interventions implemented as appropriateGoal: Optimal Wound HealingOutcome: Interventions implemented as appropriate Received pt at 0700. Assessment as follows:  NEURO: Alert to person, confused at times. PERRL.  Calm, cooperative, and pleasant with staff providing care. RESP: 2L NC. No cough, dyspnea.  Sating >95%CV:  No tele.+2 b/l LE pulses . No edema. GI/GU: Regular diet. Pills crushed in pudding. Denies nausea. Foley cath to bsb.Continent of bowel. LBM: 5/2. MSK: Asst x1 OOB to chairSKIN: Prevena in place to R hip, dressing CDI. Bruising and swelling noted. +dorsi flexion, +pedal pulse PAIN: Headache- tylenol  given w/ good effect.LINES:L#20G. NS continuous at 100 ml/hr.SAFETY: Hourly safety rounding. Fall precautions in place. Bed alarm active. POC ongoing.Temp:  [97.3 ?F (36.3 ?C)-98.2 ?F (36.8 ?C)] 97.9 ?F (36.6 ?C)Pulse:  [78-99] 99Resp:  [11-20] 17BP: (111-148)/(60-76) 114/60SpO2:  [94 %-100 %] 96 %Device (Oxygen Therapy): room airO2 Flow (L/min):  [2] 2Electronically Signed by Tenny Felix, RN, Mar 08, 2024

## 2024-03-08 NOTE — Other
 [facility]	Medicine Consult Progress NoteRequesting provider: Charm Coombs, DOAdmit date: 4/30/2025Reason for consult: rhabdo, AKI, pre-op medical assessmentSummary:Holly Erickson is a 83 y.o. female with history of tobacco use (quit one month ago), depression (on duloxetine ), pHTN (on TTE 07/2023, RVSP 65), neuropathy (on gabapentin ), iron  deficiency anemia (resolved), gait disorder (ambulates with walker), and b/l hip arthroplasty who was admitted on 03/05/2024 with right periprosthetic hip fracture with plan for surgical management. Her hospital course has been complicated by delirium. Internal medicine consult service was originally consulted on 4/30 for pre op risk stratification and AKI due to rhabdo.  Relevant hospital course:- presented to ED on 4/30 at 5am after a fall the day prior and being found down. Son voiced concern with patient living alone, requesting CM for rehab, became restless and agitated in ED, Labs with contaminated urine sample (with skin cells), WBC 14.2, Hb 9.1, plt 141, CK 5276, Vit D 51 (from 42), sCr 1.6 (b/l 0.8), Ca 8.7, [place] hip with periprosthetic fracture; [place] head with nothing acute. She was given 1L NS. - 5/1 medicine pre-op assessment. - 5/2 some delirium; went to OR, case went fineToday, patient states she is very angry with [employer]. She apparently forgot to send her check and so they are asking her to remember a 6 digit number and an 18 digit number and she told them she just underwent emergency surgery and she is very frustrated with the situation. It wasn't 100% clear, but I believe she was trying to reach a son in [place] . However, she said that her son who is currently in [facility] will get this all sorted out. 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 She can't believe she has a UTI and didn't even know it. She is asymptomatic.Vital signs: intermittent tachycardia, normal bp, rr, o2 satsPhysical exam: Woman appearing stated age, lying in bed in NAD, textingNCATNo respiratory distressFoley still in placeLabs reviewed by me and significant for: WBC 7.4, Hb 8.1 (9.1), plt 160 (145)sCr 0.8 (b/l)AST 77 (down from 154 > 119 > 77)ALT 57 (down from 83 > 76 > 57)CK 1020 (from 1345)Ferritin 183, sat 25%, TIBC 234 (low), B12 668, Assessment:1F w R total hip admitted for R periprosthetic fracture, surgery 5/2, with hospital course c/b delirium. Recommendations:#contaminated urine sample- patient does not have a UTI. Patient had a urine sample drawn in the ED while on the bed pan and it was contaminated with skin cells (urine squamous epithelial cells 19/hpf). It is not asymptomatic bacteriuria. It is bacteria from the patient's skin. - IF patient has any urinary symptoms concerning for UTI (dysuria, increased urinary frequency; which she does not currently have), would repeat UA and ensure it is a clean sample (with less than 3 squamous epithelial cells per hpf). - disregard urine culture from 4/30. The presence of GBS on patient's perineum is not relevant.- please remove foley catheter. Indication is not clear and it puts patient at risk for getting a hospital-acquired infection (CAUTI). - STOP antibiotics #severe rhabdo from prolonged immobilization, improved/resolved- CK downtrending- STOP IVF - CK is well below 5,000 and downtrending and patient is euvolemic- STOP trending CK- add on uric acid#normocytic anemiaChronic since at least 07/2023- iron  studies most consistent with anemia of chronic disease; iron  deficiency seen in 2024 has resolved. #elevated liver enzymes- likely 2/2 rhabdo, declining, no need to continue to trend. Can repeat outpatient in 4-6 weeksChristine Chanice Brenton, MDI spent more than 60 minutes evaluating patient, reviewing records, interpreting data, and formulating management  plan. I texted my recommendations to [facility] to covering provider.

## 2024-03-08 NOTE — H&P
 Brief medicine consult note:Please perform trial of void ASAP in order to remove foley and minimize infection risk. Full note to come. Geraldene Kleine, MD

## 2024-03-08 NOTE — Progress Notes
 Orthopedic Surgery Progress NoteDOS: 03/07/2024: Right total hip revision - Dr. Charm Coombs, UJW:JXBJYN with delirium post operatively, very confused about current whereabouts.O:Temp:  [97.3 ?F (36.3 ?C)-98.2 ?F (36.8 ?C)] 97.8 ?F (36.6 ?C)Pulse:  [78-102] 86Resp:  [11-20] 17BP: (117-150)/(60-76) 117/64SpO2:  [94 %-100 %] 98 %Device (Oxygen Therapy): nasal cannulaO2 Flow (L/min):  [2] 2NAD, WGN:FAOZHYQ holding suction Able to DF/PF ankle, flex/extend toesSILT grossly sp/dp/t nerve distributionstoes wwpLabs:Hgb 8.1Rest of labs pendingImaging:No new imagingA/P: Javianna Langlie is a 83 y.o. female stable s/p R hip revision for periprosthetic fracture.-WB: WBAT with PT-Regular diet-Pain control -DVT ppx: eliquis  2.5mg  BID x 6 weeks-Dispo: pending PT eval, resolution of deliriumMaxwell Alfred Imperial, MDPlease use contact information below:JointsCheck Covering Provider or call [phone] Fx/GeneralCheck Covering Provider or call [phone]

## 2024-03-08 NOTE — Plan of Care
 Inpatient Physical Therapy Evaluation IP Adult PT Eval/Treat - 03/08/24 0850    Date of Visit / Treatment  Date of Visit / Treatment 03/08/24   Note Type Evaluation   Progress Report Due 03/18/24   Start Time 810   End Time 850   Total Treatment Time 40   Co-Treatment Performed Co-treatment performed with separate and distinct therapeutic interventions between PT and OT for concurrent facilitation of balance, postural control, mobility and/or ADL training    General Information  Pertinent History Of Current Problem Pt is a 83 yo female adm to [facility] 03/05/24 s/p Right total hip revision and orif proximal femur   Subjective Pt alert and agreeable to therapy   Referring Physician  Dr Serina Dane   General Observations Pt received resting in bed, on 2L of NC O2, NAD   Precautions/Limitations Fall Precautions   Precautions/Limitations Comment WBAT RLE; anterior hip px    Weight Bearing Status  Weight Bearing Status Comments WBAT RLE    Vital Signs and Orthostatic Vital Signs  Vital Signs Free text On 2L of NC O2; per RN attempt to wean; 96% on room air, RN aware    Pain/Comfort  Pain Location - Side Right   Pain Location hip   Pain Comment (Pre/Post Treatment Pain) Pt with mod to max pain in R hip with mobility, premedicated, RN aware, ice applied    Patient Coping  Observed Emotional State accepting   Verbalized Emotional State acceptance    Cognition  Overall Cognitive Status Within Functional Limits   Orientation Level Oriented X4   Level of Consciousness alert   Following Commands Follows all commands and directions without difficulty    Vision/ Hearing  Vision Assessment Results No vision deficits noted    Range of Motion  Range of Motion Examination WFL except   Range of Motion Comments R hip limited by pain    Manual Muscle Testing  Manual Muscle Testing Results WFL except   Manual Muscle Testing Comments RLE grossly 3-/5; [facility] for mobility    Muscle Tone  Muscle Tone Testing Results No muscle tone deficits noted    Sensory Assessment  Sensory Tests Results No sensory impairment noted    Skin Assessment  Skin Assessment See Nursing Documentation    Balance  Sitting Balance: Static  FAIR-      Contact Guard to maintain static position with no Assistive Device   Sitting Balance: Dynamic  POOR+   Moves through 1/2 range with minimal assist to right self   Standing Balance: Static POOR      Moderate assist to maintain static position with no Assistive Device   Standing Balance: Dynamic  POOR-    Unable to move from midline voluntarily, maximal assist to right self   Balance Assist Device Stedy   Balance Skills Training Comment Ax2    Mobility  Mobility Detailed Documentation Bed-Chair Transfer    Bed Mobility  Supine-to-Sit Independence/Assistance Level Moderate assist;Assist of 2   Supine-to-Sit Assist Device Bed rails;Head of bed elevated   Bed Mobility Comments Assistance provided for RLE management    Sit-Stand Transfer Training  Sit-to-Stand Transfer Independence/Assistance Level Moderate assist;Assist of 2   Sit-to-Stand Transfer Assist Device Stedy   Stand-to-Sit Transfer Independence/Assistance Level Moderate assist;Assist of 2   Stand-to-Sit Transfer Assist Device [facility] Analysis Impairments pain   Sit-Stand Transfer Comments Cues for hand placement and sequencing    Bed-Chair Transfer Training  Bed-to-Chair Transfer Independence/Assistance Level Total assist/dependent;Assist of  2   Bed-to-Chair Transfer Assist Device [place]    Handoff Documentation  Handoff Patient in chair;Chair alarm;Pressure relief cushion;Discussed with nursing;Patient instructed to call nursing for mobility    Activity Tolerance  Activity Tolerance Comments Fair    PT- AM-PAC - Basic Mobility Screen- How much help from another person do you currently need.....  Turning from your back to your side while in a a flat bed without using rails? 2 - A Lot - Requires a lot of help (maximum to moderate assistance). Can use assistive devices.   Moving from lying on your back to sitting on the side of a flat bed without using bed rails? 2 - A Lot - Requires a lot of help (maximum to moderate assistance). Can use assistive devices.   Moving to and from a bed to a chair (including a wheelchair)? 1 - Total - Requires total assistance or cannot do it at all.   Standing up from a chair using your arms(e.g., wheelchair or bedside chair)? 2 - A Lot - Requires a lot of help (maximum to moderate assistance). Can use assistive devices.   To walk in a hospital room? 1 - Total - Requires total assistance or cannot do it at all.   Climbing 3-5 steps with a railing? 1 - Total - Requires total assistance or cannot do it at all.   AMPAC Mobility Score 9   TARGET Highest Level of Mobility Mobility Level 3, Sit on edge of bed   ACTUAL Highest Level of Mobility Mobility Level 4, Transfer to chair    Clinical Impression  Initial Assessment Pt tolerated therapy eval well; pt limited by decr strength, balance and activity tolerance with incr pain. Pt mobilizing with Ax2 and stedy. Pt will require mod complexity support and therapy services upon d/c   Criteria for Skilled Therapeutic Interventions Met yes;treatment indicated   Rehab Potential good, to achieve stated therapy goals    Patient/Family Stated Goals  Patient/Family Stated Goal(s) decrease pain    Frequency/Equipment Recommendations  PT Frequency Daily   Next Treatment Expected 03/09/24   PT/PTA completing this assessment Meg M   Equipment Needs During Admission/Treatment Stedy    PT Recommendations for Inpatient Admission  Activity/Level of Assist out of bed;transfers only;assist of 2;stedy    Education - Learning Assessment   Education Topic Functional mobility techniques;Precautions;Weight bearing;Therapy role/rehab process;Disease process   Learners Patient   Readiness Acceptance   Method Explanation;Demonstration   Response Verbalizes Understanding;Demonstrated Understanding    PT Discharge Summary  Physical Therapy Disposition Recommendation Moderate complexity support and therapy to progress functional mobility/ ADLs/ IADLs recommended for post- acute care.  See assessment for additional details.   Equipment Recommendations for Discharge To be determined pending progress     Problem: Physical Therapy GoalsGoal: Physical Therapy GoalsDescription: PT GOALS1. Patient will perform bed mobility with mod A x1 and bed rails2. Patient will perform transfers with mod A x1 and most appropriate device3. Patient will ambulate a minimum of 25 feet with mod A x1 and least assistive device 4. Patient will demonstrate good static standing balance with BUE support and most appropriate device5. Patient will report no more than 2/10 pain with mobilityOutcome: Initial problem identification Holly Erickson, PT, DPT

## 2024-03-08 NOTE — Plan of Care
 Problem: Adult Inpatient Plan of CareGoal: Plan of Care ReviewOutcome: Interventions implemented as appropriate Plan of Care Overview/ Patient StatusPt is alert to self only, follows commands, can be restless at times, easily redirected. On 2L NC, lungs diminished. SR/ST Hr 80-100. Abdomen soft, non-tender, +BS. Pills crushed in pudding, tolerating thin liquids. Incontinent of stool overnight. Foley in place draining yellow urine. Ax2 to T&R Q2H. SCDs on. Prevena in place to R hip, dressing CDI. Bruising and swelling noted. +dorsi flexion, +pedal pulse. UTA N/T due to orientation. NS running at 100 cc/hr. Tylenol  and oxy for pain. Frequent rounding performed, bed locked in lowest position, bed alarm on. Jymir Dunaj, RN.

## 2024-03-08 NOTE — Plan of Care
 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   Subjective agreeable   General Observations Pt supine in bed, 2L NC, NAD; RN cleared pt for therapy   Precautions/Limitations Fall Precautions   Precautions/Limitations Comment WBAT RLE; ant hip precautions    Weight Bearing Status  Weight Bearing Status Comments WBAT RLE    Prior Level of Functioning/Social History  Additional Comments Pt poor historian; reports living alone with 1 STE and previously recieivng PT; has shower chair with tub/shower; son lives close by and available to help    Vital Signs and Orthostatic Vital Signs  Vital Signs Free text Beginning of session pt on 2L NC; RN ok to wean to RA with SpO2 96%    Pain/Comfort  Pain Comment (Pre/Post Treatment Pain) Pt c/o mod pain in R hip inc. with mob., premedicated, RN aware    Patient Coping  Observed Emotional State accepting    Cognition  Overall Cognitive Status Impaired   Orientation Level Oriented to person;Oriented to place;Oriented to time   Level of Consciousness alert   Following Commands Follows one step commands with increased time;Follows one step commands with repetition Cognition Comments Pt slightly confused; easily able to be directed; slightly garbled speech however able to clarify when asked to repeat    Vision/ Hearing  Vision Assessment Results No vision deficits noted    Range of Motion  Range of Motion Examination WFL except   Range of Motion Comments R hip 2/2 pain    Musculoskeletal  LUE Muscle Strength Grading 4-->active movement against gravity and resistance   RUE Muscle Strength Grading 4-->active movement against gravity and resistance   LLE Muscle Strength Grading 4-->active movement against gravity and resistance   RLE Muscle Strength Grading 3-->active movement against gravity    Muscle Tone  Muscle Tone Testing Results No muscle tone deficits noted    Coordination  Fine Motor Coordination occasional resting tremor; pt reports this is baseline    Sensory Assessment  Sensory Tests Results No sensory impairment noted    Skin Assessment  Skin Assessment See Nursing Documentation    Balance  Sitting Balance: Static  FAIR-      Contact Guard to maintain static position with no Assistive Device   Sitting Balance: Dynamic  POOR+   Moves through 1/2 range with minimal assist to right self   Standing Balance: Static POOR      Moderate assist to maintain static position with no Assistive Device   Standing Balance: Dynamic  POOR-    Unable to move from midline voluntarily, maximal assist to right self   Balance Assist [facility]   Balance Skills Training Comment Ax2    ADL: Eating/Self-Feeding  Independence/Assistance Level Set-up required;Minimum assist   Independence limited  by Impaired cognition;Impaired fine motor skills   Eating/Self Feeding Comments Pt required meal set up with redirectioning and cueing to meal located on tray     AM-PAC - Daily Activity IP Short Form  Help needed from another person putting on/taking off regular lower body clothing 2 - A Lot   Help needed from another person for bathing (incl. washing, rinsing, drying) 2 - A Lot   Help needed from another person for toileting (incl. using toilet, bedpan, urinal) 2 - A Lot   Help needed from another person putting on/taking off regular upper body clothing 3 - A Little   Help needed from another person taking care of personal grooming such as brushing teeth 3 - A Little   Help needed from another person eating meals 3 - A Little   AM-PAC Daily Activity Raw Score (Total of rows above) 15   CMS Score (based on Raw Score - with G Code) 15 - 56.46% impaired      (G Code - CK)    Bed Mobility  Supine-to-Sit Independence/Assistance Level Moderate assist;Assist of 2   Supine-to-Sit Assist Device Bed rails;Draw pad;Head of bed elevated    Sit-Stand Transfer Training  Sit-to-Stand Transfer Independence/Assistance Level Moderate assist;Assist of 2   Sit-to-Stand Transfer Assist Device St[place]   Stand-to-Sit Transfer Independence/Assistance Level Moderate assist;Assist of 2   Stand-to-Sit Transfer Assist Device Stedy   Occupation-Based Indications: Sit to Stand Transfers To perform grooming tasks standing;To simulate toilet transfers   Limitations to Sit to Stand Transfers for Occupation-Based Tasks Balance deficits;Impaired activity tolerance;Weakness    Automotive engineer Independence/Assistance Level Total assist/dependent   Bed-to-Chair Transfer Assist Device Stedy    Handoff Documentation  Handoff Patient in chair;Chair alarm;Hook and loop belt on - patient return demonstration;Patient instructed to call nursing for mobility;Discussed with nursing    Therapeutic Functional Activity  Interventions to support occupations Engaged in functional transfers in preparation for ADL tasks;Engaged in balance tasks to improve ability to complete ADL tasks;Challenged activity tolerance to increase independence with ADLs    Clinical Impression  Rehab Diagnosis R hip fracture   Initial Assessment Pt tol OT eval fair. Deficits in activity tolerance and cognition and limited by pain requiring Ax2 with stedy for bed to chair transfer. Cueing provided for reorientation and sequencing. Rec Mod complexity upon d/c    Patient/Family Stated Goals  Patient/Family Stated Goal(s) decrease pain    Frequency/Equipment Recommendations  OT Frequency 5x per week   Next Treatment Expected 03/09/24   OT/OTA completing this assessment Jahmad Petrich    OT Recommendations for Inpatient Admission  ADL Recommendations assist of 2;transfers only;stedy    Education - Learning Assessment  Education Topic Functional mobility techniques;ADL techniques;Safety;Precautions   Learners Patient   Readiness Eager   Method Explanation   Response Needs Reinforcement    Planned Treatment / Interventions  Education Treatment / Interventions Patient Education / Training    OT Discharge Summary  Disposition Recommendation Moderate complexity support and therapy to progress functional mobility/ ADLs/ IADLs recommended for post- acute care.  See assessment for additional details.     Problem: Occupational Therapy GoalsGoal: Occupational Therapy GoalsDescription: Pt will be min A with LB dressing to inc ADL (I)Pt will be min A with toileting routine to inc ADL (I)Pt will be mod I with self feeding to inc ADL (I)Pt will be min A with grooming routine to inc ADL (I)Outcome: Interventions implemented as appropriate Lennox Racer, OTR/L

## 2024-03-08 NOTE — Plan of Care
 Problem: Adult Inpatient Plan of CareGoal: Readiness for Transition of CareOutcome: Interventions implemented as appropriatePlan of Care Overview/ Patient Status75 year old female with PMH of bilateral total hip arthroplasty (right THA with Dr. Kendra Pavy on 07/30/2023) who presented to [facility] after a fall on her right side found to have a right periprosthetic hip fracture. CM completed IA. Patient resides alone in elevator accessible apartment building and was independent w/ ADLs and IADLs prior to admission. Patient uses a straight cane for ambulation at baseline. Patient owns a rollator, SC and manual wheelchair Patient known to [facility] from a previous encounter Family to transport if  for home Case Management will take lead to arrange post -acute care services and continue to work with the care team as the patient progresses towards dischargeCase Management/Social Work Screening and Cabin crew Row Most Recent Value Case Management/Social Work Screening: Chart review completed. If YES to any question below then proceed to Eval/Plan  Is there a change in their cognitive function No Do you anticipate that the patient will have any discharge needs requiring CM/SW intervention? Yes Has there been an unscheduled readmission within the last 30 days and/or four (4) encounters (encounters include: ED, OBS, Inpatient) within the last six (6) months? No Were there services prior to admission ( Examples: Acute [facility],  Assisted Living, HD, Homecare, Extended Care Facility, Methadone, SNF, Outpatient Infusion Center) No Concern for current abuse/neglect/interpersonal violence/sexual assault  No Connected to state agency? No Guardianship or conservatorship No Negative/Positive Screen Positive Screening: Complete CM/SW Evaluation and Plan Case Management/Social Work Evaluation and Plan  Arrived from prior to admission home/apartment/condo Lives with Alone Services Prior to Admission None Patient Requires Transition of Care Intervention Due To Ability to make medical decisions in question Prior to Hospitalization: Assistance Needed/DME being used Ambulation Ambulation Assistance/DME: Occupational hygienist, Manual wheelchair, Straight cane Documented Insurance Accurate Yes  [[employer] MCR] Any financial concerns related to anticipated discharge needs No Patient's home address verified Yes Patient's PCP of record verified Yes  Jene Minors, MD] Last Date Seen by PCP 0-3 months Source of Clinical History  Patient's clinical history has been reviewed and source of Information is: Medical Record, Medical Provider, Patient Discharge Planning Coordination Recommendations  Discharge Planning Coordination Recommendations Needs not determined at this time Case Manager/Social Worker reviewed plan of care/ continuum of care need's with  Discharge Planning Rounds, Interdisciplinary Team, Patient Housing / Transportation/ Environment  What is your living situation today? I have a steady place to live In the past 12 months has the electric, gas, oil, or water company threatened to shut off services in your home? No Within the past 12 months, you worried that your food would run out before you got the money to buy more. Never true Within the past 12 months, the food you bought just didn't last and you didn't have money to get more. Never true In the past 12 months, has lack of transportation kept you from medical appointments or from getting medications? no In the past 12 months, has lack of transportation kept you from meetings, work, or from getting things needed for daily living? No  Abuse Screen  Is there anyone in your life that is hurting or threatening you in anyway? no Read to patient We know that many of our patients and caregivers are exposed to violence in the home so we have started letting everyone know that [place]  has a safe, confidential and free 24/7 hotline called [place] [facility] no education not  provided Would it be ok if we add this resource to your DC paperwork? no Abuse Screen (yes response referral indicated)  Able to respond to abuse questions Yes Is there anyone in your life that is hurting or threatening you in anyway? no Physical Indicators of Abuse No evidence of physical abuse Read to patient We know that many of our patients and caregivers are exposed to violence in the home so we have started letting everyone know that [place]  has a safe, confidential and free 24/7 hotline called [place] [facility] no education not provided Would it be ok if we add this resource to your DC paperwork? no  Slater Duncan MSN RN Care Manager [facility] [phone])

## 2024-03-09 LAB — CBC WITH AUTO DIFFERENTIAL
BKR WAM ABSOLUTE IMMATURE GRANULOCYTES.: 0.04 x 1000/ÂµL (ref 0.00–0.30)
BKR WAM ABSOLUTE LYMPHOCYTE COUNT.: 1.44 x 1000/ÂµL (ref 0.60–3.70)
BKR WAM ABSOLUTE NRBC (2 DEC): 0 x 1000/ÂµL (ref 0.00–1.00)
BKR WAM ANC (ABSOLUTE NEUTROPHIL COUNT): 5.76 x 1000/ÂµL (ref 2.00–7.60)
BKR WAM BASOPHIL ABSOLUTE COUNT.: 0.02 x 1000/ÂµL (ref 0.00–1.00)
BKR WAM BASOPHILS: 0.2 % (ref 0.0–1.4)
BKR WAM EOSINOPHIL ABSOLUTE COUNT.: 0.19 x 1000/ÂµL (ref 0.00–1.00)
BKR WAM EOSINOPHILS: 2.3 % (ref 0.0–5.0)
BKR WAM HEMATOCRIT (2 DEC): 22.9 % — ABNORMAL LOW (ref 35.00–45.00)
BKR WAM HEMOGLOBIN: 7.4 g/dL — ABNORMAL LOW (ref 11.7–15.5)
BKR WAM IMMATURE GRANULOCYTES: 0.5 % (ref 0.0–1.0)
BKR WAM LYMPHOCYTES: 17.5 % (ref 17.0–50.0)
BKR WAM MCH (PG): 31.9 pg (ref 27.0–33.0)
BKR WAM MCHC: 32.3 g/dL (ref 31.0–36.0)
BKR WAM MCV: 98.7 fL (ref 80.0–100.0)
BKR WAM MONOCYTE ABSOLUTE COUNT.: 0.76 x 1000/ÂµL (ref 0.00–1.00)
BKR WAM MONOCYTES: 9.3 % (ref 4.0–12.0)
BKR WAM MPV: 9.8 fL (ref 8.0–12.0)
BKR WAM NEUTROPHILS: 70.2 % (ref 39.0–72.0)
BKR WAM NUCLEATED RED BLOOD CELLS: 0 % (ref 0.0–1.0)
BKR WAM PLATELETS: 175 x1000/ÂµL (ref 150–420)
BKR WAM RDW-CV: 15.1 % — ABNORMAL HIGH (ref 11.0–15.0)
BKR WAM RED BLOOD CELL COUNT.: 2.32 M/ÂµL — ABNORMAL LOW (ref 4.00–6.00)
BKR WAM WHITE BLOOD CELL COUNT: 8.2 x1000/ÂµL (ref 4.0–11.0)

## 2024-03-09 LAB — BASIC METABOLIC PANEL
BKR ANION GAP: 7 (ref 7–17)
BKR BLOOD UREA NITROGEN: 19 mg/dL (ref 8–23)
BKR BUN / CREAT RATIO: 27.1 — ABNORMAL HIGH (ref 8.0–23.0)
BKR CALCIUM: 8 mg/dL — ABNORMAL LOW (ref 8.8–10.2)
BKR CHLORIDE: 113 mmol/L — ABNORMAL HIGH (ref 98–107)
BKR CO2: 22 mmol/L (ref 20–30)
BKR CREATININE DELTA: -0.1
BKR CREATININE: 0.7 mg/dL (ref 0.40–1.30)
BKR EGFR, CREATININE (CKD-EPI 2021): >60 mL/min/{1.73_m2} (ref >=60)
BKR GLUCOSE: 116 mg/dL — ABNORMAL HIGH (ref 70–100)
BKR POTASSIUM: 4.2 mmol/L (ref 3.3–5.3)
BKR SODIUM: 142 mmol/L (ref 136–144)

## 2024-03-09 LAB — PHOSPHORUS     (BH GH L LMW YH): BKR PHOSPHORUS: 2.1 mg/dL — ABNORMAL LOW (ref 2.2–4.5)

## 2024-03-09 LAB — URIC ACID: BKR URIC ACID: 2.9 mg/dL (ref 2.7–7.3)

## 2024-03-09 LAB — MAGNESIUM: BKR MAGNESIUM: 2 mg/dL (ref 1.7–2.4)

## 2024-03-09 MED ORDER — GABAPENTIN 100 MG CAPSULE
100 | Freq: Every day | ORAL | Status: DC
Start: 2024-03-09 — End: 2024-03-10

## 2024-03-09 MED ORDER — CEPHALEXIN 500 MG CAPSULE
500 | Freq: Four times a day (QID) | ORAL | Status: DC
Start: 2024-03-09 — End: 2024-03-11
  Administered 2024-03-09 – 2024-03-10 (×5): 500 mg via ORAL

## 2024-03-09 MED ORDER — GABAPENTIN 100 MG CAPSULE
100 | Freq: Every day | ORAL | Status: DC
Start: 2024-03-09 — End: 2024-03-11
  Administered 2024-03-09: 22:00:00 100 mg via ORAL

## 2024-03-09 NOTE — Plan of Care
 Inpatient Physical Therapy Progress Note IP Adult PT Eval/Treat - 03/09/24 1126    Date of Visit / Treatment  Date of Visit / Treatment 03/09/24   Note Type Progress Note   Start Time 1156   End Time 1126   Total Treatment Time 30    General Information  Subjective They took alot of blood out of my arms. I don't know if I can walk   General Observations pt in bed on RA on bed pan upon arrival   Precautions/Limitations Fall Precautions;Chair alarm;Bed alarm;Anterior hip;Right hip precautions   Precautions/Limitations Comment WBAT RLE    Weight Bearing Status  Weight Bearing Status Comments WBAT RLE    Vital Signs and Orthostatic Vital Signs  Vital Signs Free text pt stable on RA    Pain/Comfort  Pain Comment (Pre/Post Treatment Pain) ice applied to R hip post rx    Patient Coping  Observed Emotional State accepting    Cognition  Level of Consciousness alert   Following Commands Follows one step commands with repetition   2nd pt HOH at times  Personal Safety / Judgment Fall risk    Skin Assessment  Skin Assessment See Nursing Documentation    Balance  Sitting Balance: Static  FAIR       Maintains static position without assist or device, may require Supervision or Verbal Cues (<2 minutes)   Sitting Balance: Dynamic  FAIR-     Performs dynamic activities through partial range (50-75%) with Contact Guard   Standing Balance: Static POOR+    Minimal assist to maintain static position with no Assistive Device   Standing Balance: Dynamic  POOR+   Moves through 1/2 range with minimal assist to right self   Balance Assist Device Rolling walker   Balance Skills Training Comment stedy 1st trial then transitioned to RW    Bed Mobility  Rolling/Turning Right - Independence/Assistance Level Moderate assist;Maximum assist;Assist of 1;Verbal cues   Rolling/Turning Left - Independence/Assistance Level Moderate assist;Maximum assist;Assist of 1;Verbal cues   Rolling/Turning Assist Device Bed rails   Supine-to-Sit Independence/Assistance Level Moderate assist;Assist of 2;Verbal cues   Supine-to-Sit Assist Device Head of bed elevated;Draw pad;Bed rails   Bed Mobility Comments turning to remove bedpan and perform perineal care/change bed pad    Sit-Stand Transfer Training  Sit-to-Stand Transfer Independence/Assistance Level Minimum assist;Assist of 1 person + 1 person to manage equipment;Verbal cues   Sit-to-Stand Transfer Assist Device Stedy   Stand-to-Sit Transfer Independence/Assistance Level Minimum assist;Assist of 1 person + 1 person to manage equipment;Verbal cues   Stand-to-Sit Transfer Assist Device Stedy   Sit-Stand Transfer Comments then 2nd trial from recliner chair with min A x 1-2 with RW and Vc's for hand placement    Bed-Chair Transfer Training  Bed-to-Chair Transfer Independence/Assistance Level Total assist/dependent   Bed-to-Chair Transfer Assist Device Stedy    Gait Training  Independence/Assistance Level  Minimum assist;Assist of 1 person + 1 person to manage equipment;Verbal cues   2nd person following with chair  Assistive Device  Rolling walker   Gait Distance 10 feet   Gait Pattern Analysis 3-point gait   Gait Analysis Deviations decreased cadence;decreased step length;increased time in double stance;decreased weight-shifting ability;antalgic gait   step to, shuffling, forward flexec, incr time/effort.  Gait Training Comments pt returned back to bedside in recliner 2nd fatigue    Handoff Documentation  Handoff Patient in chair;Hook and loop belt on - patient return demonstration;Patient instructed to call nursing for mobility;Discussed with nursing;Pressure relief cushion  PT- AM-PAC - Basic Mobility Screen- How much help from another person do you currently need.....  Turning from your back to your side while in a a flat bed without using rails? 2 - A Lot - Requires a lot of help (maximum to moderate assistance). Can use assistive devices.   Moving from lying on your back to sitting on the side of a flat bed without using bed rails? 1 - Total - Requires total assistance or cannot do it at all.   Moving to and from a bed to a chair (including a wheelchair)? 1 - Total - Requires total assistance or cannot do it at all.   Standing up from a chair using your arms(e.g., wheelchair or bedside chair)? 2 - A Lot - Requires a lot of help (maximum to moderate assistance). Can use assistive devices.   To walk in a hospital room? 3 - A Little - Requires a little help (supervision, minimal assistance). Can use assistive devices.   Climbing 3-5 steps with a railing? 1 - Total - Requires total assistance or cannot do it at all.   AMPAC Mobility Score 10   TARGET Highest Level of Mobility Mobility Level 4, Transfer to chair   ACTUAL Highest Level of Mobility Mobility Level 6, Walk 10+ steps    Modality Treatments  Modality Treatments Comments ice applied to R hip post rx    Clinical Impression  Follow up Assessment Pt progressing well with transfers/gait using RW with A x2 for safety. Pt limited by pain, fatigue, general weakness, and impaired balance. Pt remains high fall risk and not safe to mobilize without assist x 1-2 and RW   Criteria for Skilled Therapeutic Interventions Met yes   Rehab Potential good, to achieve stated therapy goals    Patient/Family Stated Goals  Patient/Family Stated Goal(s) get stronger    Frequency/Equipment Recommendations  PT Frequency Daily   Next Treatment Expected 03/10/24   PT/PTA completing this assessment [place] Ida    PT Recommendations for Inpatient Admission  Activity/Level of Assist assist of 2;with rolling walker    Planned Treatment / Interventions  Plan for Next Visit continue to progress mobility /gait using RW    PT Discharge Summary  Physical Therapy Disposition Recommendation Moderate complexity support and therapy to progress functional mobility/ ADLs/ IADLs recommended for post- acute care.  See assessment for additional details.   Additional Therapy Recommendations Physical Therapy Services in Discharge Environment

## 2024-03-09 NOTE — Progress Notes
 Orthopedic Surgery Progress NoteDOS: 03/07/2024: Right total hip revision - Dr. Charm Coombs, WGN:FAOZHYQ a book this AM. Feeling well but having pain in right hip which we discussed is expected given big surgery.O:Temp:  [97.6 ?F (36.4 ?C)-98.4 ?F (36.9 ?C)] 97.6 ?F (36.4 ?C)Pulse:  [74-99] 74Resp:  [16-17] 17BP: (104-130)/(60-66) 130/66SpO2:  [96 %-98 %] 97 %Device (Oxygen Therapy): room airNAD, MVH:QIONGEX holding suction Able to DF/PF ankle, flex/extend toesSILT grossly sp/dp/t nerve distributionstoes wwpLabs:Labs orderedImaging:No new imagingA/P: Holly Erickson is a 83 y.o. female stable s/p R hip revision for periprosthetic fracture.-WB: WBAT with PT-Regular diet-Pain control -DVT ppx: eliquis  2.5mg  BID x 6 weeks-Dispo: pending PT/placementMaxwell Alfred Imperial, MDPlease use contact information below:JointsCheck Covering Provider or call [phone] Fx/GeneralCheck Covering Provider or call [phone]

## 2024-03-09 NOTE — Plan of Care
 Plan of Care Overview/ Patient Status0700-1900: A&Ox4, VSS on RA, minimal complaint of pain, not requesting pain medication this shift. Mixed incontinence of bowel and bladder, LBM: 03/08/24. Prevena to R hip, NPWT in use. Ax1 q2 T&R in bed. Ax1 w/ Sera. Pills whole. Bed alarm activated, hourly rounding complete and safety maintained.Problem: Adult Inpatient Plan of CareGoal: Plan of Care ReviewOutcome: Interventions implemented as appropriateGoal: Patient-Specific Goal (Individualized)Outcome: Interventions implemented as appropriateGoal: Absence of Hospital-Acquired Illness or InjuryOutcome: Interventions implemented as appropriateGoal: Optimal Comfort and WellbeingOutcome: Interventions implemented as appropriateGoal: Readiness for Transition of CareOutcome: Interventions implemented as appropriate Problem: Fall Injury RiskGoal: Absence of Fall and Fall-Related InjuryOutcome: Interventions implemented as appropriate Problem: Skin Injury Risk IncreasedGoal: Skin Health and IntegrityOutcome: Interventions implemented as appropriate Problem: Violence Risk or ActualGoal: Anger and Impulse ControlOutcome: Interventions implemented as appropriate Problem: Restraint, NonviolentGoal: Absence of Harm or InjuryOutcome: Interventions implemented as appropriate Problem: WoundGoal: Optimal CopingOutcome: Interventions implemented as appropriateGoal: Optimal Functional AbilityOutcome: Interventions implemented as appropriateGoal: Absence of Infection Signs and SymptomsOutcome: Interventions implemented as appropriateGoal: Improved Oral IntakeOutcome: Interventions implemented as appropriateGoal: Optimal Pain Control and FunctionOutcome: Interventions implemented as appropriateGoal: Skin Health and IntegrityOutcome: Interventions implemented as appropriateGoal: Optimal Wound HealingOutcome: Interventions implemented as appropriate Problem: Physical Therapy GoalsGoal: Physical Therapy GoalsDescription: PT GOALS1. Patient will perform bed mobility with mod A x1 and bed rails2. Patient will perform transfers with mod A x1 and most appropriate device3. Patient will ambulate a minimum of 25 feet with mod A x1 and least assistive device 4. Patient will demonstrate good static standing balance with BUE support and most appropriate device5. Patient will report no more than 2/10 pain with mobilityOutcome: Interventions implemented as appropriate

## 2024-03-09 NOTE — Plan of Care
 Plan of Care Overview/ Patient StatusProblem: Adult Inpatient Plan of CareGoal: Readiness for Transition of CareOutcome: Interventions implemented as appropriate Case Management Screening  Flowsheet Row Most Recent Value Case Management Screening: Chart review completed. If YES to any question below then proceed to CM Eval/Plan  Is there a change in their cognitive function No Do you anticipate that the patient will have any discharge needs requiring CM/SW intervention? Yes Has there been an unscheduled readmission within the last 30 days and/or four (4) encounters (encounters include: ED, OBS, Inpatient) within the last six (6) months? No Were there services prior to admission ( Examples: Acute [facility],  Assisted Living, HD, Homecare, Extended Care Facility, Methadone, SNF, Outpatient Infusion Center) No Negative/Positive Screen Positive Screening: Complete CM/SW Evaluation and Plan Case Manager/Social Work Attestation  I have reviewed the medical record and completed the above screen. CM/SW staff will follow patient's progress and discuss the plan of care with the Treatment Team. Yes  Case Management/Social Work Evaluation  Flowsheet Row Most Recent Value Case Management/Social Work Evaluation and Plan  Arrived from prior to admission home/apartment/condo Lives with Alone Services Prior to Admission None Patient Requires Transition of Care Intervention Due To Ability to make medical decisions in question Prior to Hospitalization: Assistance Needed/DME being used Ambulation Ambulation Assistance/DME: Occupational hygienist, Manual wheelchair, Straight cane Documented Insurance Accurate Yes  [[employer] MCR] Any financial concerns related to anticipated discharge needs No Patient's home address verified Yes Patient's PCP of record verified Yes  Jene Minors, MD] Last Date Seen by PCP 3-6 months Source of Clinical History  Patient's clinical history has been reviewed and source of Information is: Medical Record, Son Discharge Planning Coordination Recommendations  Discharge Planning Coordination Recommendations STR (short term rehab at a SNF rehab unit) Case Manager/Social Worker reviewed plan of care/ continuum of care need's with  Family Housing / Transportation/ Environment  What is your living situation today? I have a steady place to live In the past 12 months has the electric, gas, oil, or water company threatened to shut off services in your home? No Within the past 12 months, you worried that your food would run out before you got the money to buy more. Never true Within the past 12 months, the food you bought just didn't last and you didn't have money to get more. Never true In the past 12 months, has lack of transportation kept you from medical appointments or from getting medications? no In the past 12 months, has lack of transportation kept you from meetings, work, or from getting things needed for daily living? No  Abuse Screen  Is there anyone in your life that is hurting or threatening you in anyway? no Read to patient We know that many of our patients and caregivers are exposed to violence in the home so we have started letting everyone know that [place]  has a safe, confidential and free 24/7 hotline called [place] Safe Connect no education not provided Would it be ok if we add this resource to your DC paperwork? no Abuse Screen (yes response referral indicated)  Able to respond to abuse questions Yes Is there anyone in your life that is hurting or threatening you in anyway? no Physical Indicators of Abuse No evidence of physical abuse Read to patient We know that many of our patients and caregivers are exposed to violence in the home so we have started letting everyone know that [place]  has a safe, confidential and free 24/7 hotline called [place] Safe Connect no education  not provided Would it be ok if we add this resource to your DC paperwork? no  Conni Brouillard is a 83 y.o. female hx of bilateral total hip arthroplasty (right THA with Dr. Kendra Pavy on 07/30/2023) presents to the emergency department after a fall on her right side found to have a right periprosthetic hip fracture. Patient will be admitted to orthopedics for further surgical discussion versus trial of non operative management.  Spoke with son and discussed role of CM and dc needs.  Pt lives alone in a one level apt with 1 STE.  Uses rollator, cane and at times w/c.    Not active with any srvs.  Son provides transport to/from appts and does groceries for pt.    Discussed moderate complexity and ampac and he agrees with STR.   Rep list sent.  Discussed pt will also need auth and med ready.  Discussed CM dept will call back in a few hours for 5 pref.  While reviewing he would like ref to [facility].   Ref sent  Pt will need ambulance for transport homePASSR completed and approved.  Rep list sent to sonAddendum   at 1330 received rep list as [facility] [place], [place], [place] and son verbalized [facility].   [facility] is a facility that accepts only certain dx which pt does not have.  Ref sent to others.Sherin Dingwall     RN BSN ACM-RNCase Management Department [facility]

## 2024-03-09 NOTE — Plan of Care
 Problem: Adult Inpatient Plan of CareGoal: Plan of Care ReviewOutcome: Interventions implemented as appropriateGoal: Patient-Specific Goal (Individualized)Outcome: Interventions implemented as appropriateGoal: Absence of Hospital-Acquired Illness or InjuryOutcome: Interventions implemented as appropriateGoal: Optimal Comfort and WellbeingOutcome: Interventions implemented as appropriateGoal: Readiness for Transition of CareOutcome: Interventions implemented as appropriate Problem: Fall Injury RiskGoal: Absence of Fall and Fall-Related InjuryOutcome: Interventions implemented as appropriate Problem: Skin Injury Risk IncreasedGoal: Skin Health and IntegrityOutcome: Interventions implemented as appropriate Problem: Violence Risk or ActualGoal: Anger and Impulse ControlOutcome: Interventions implemented as appropriate Problem: WoundGoal: Optimal CopingOutcome: Interventions implemented as appropriateGoal: Optimal Functional AbilityOutcome: Interventions implemented as appropriateGoal: Absence of Infection Signs and SymptomsOutcome: Interventions implemented as appropriateGoal: Improved Oral IntakeOutcome: Interventions implemented as appropriateGoal: Optimal Pain Control and FunctionOutcome: Interventions implemented as appropriateGoal: Skin Health and IntegrityOutcome: Interventions implemented as appropriateGoal: Optimal Wound HealingOutcome: Interventions implemented as appropriate Plan of Care Overview/ Patient StatusPt lying in bed with eyes looking toward the television. Pt voiced concerns about food and medication. Lunch box were ordered and MD were notified about the medication. Pt is able to make needs known. Pt in no acute distress at this time. Will continue to monitor.

## 2024-03-09 NOTE — Progress Notes
 Inpatient Occupational Therapy Progress NoteDefault Flowsheet Data (most recent)   IP Adult OT Eval/Treat - 03/09/24 1115    Date of Visit / Treatment  Date of Visit / Treatment 03/09/24   Note Type Daily Note   Progress Report Due 03/22/24   Start Time 1045   End Time 1115   Total Treatment Time 30   Co-Treatment Performed Co-treatment performed with separate and distinct therapeutic interventions between PT and OT for concurrent facilitation of balance, postural control, mobility and/or ADL training    General Information  Subjective agreeable   General Observations Pt recieved supine in bed on bedpan; RA; NAD; prevena; RN cleared pt for therapy   Precautions/Limitations Fall Precautions   Precautions/Limitations Comment WBAT RLE; ant hip precautions    Weight Bearing Status  Weight Bearing Status Comments WBAT RLE    Vital Signs and Orthostatic Vital Signs  Vital Signs Free text RA; NAD    Pain/Comfort  Pain Comment (Pre/Post Treatment Pain) pt c/o min pain occassionally with mob in R hip; ice provided; RN aware and premedicated    Cognition  Overall Cognitive Status Within Functional Limits   Orientation Level Oriented X4   Level of Consciousness alert   Following Commands Follows one step commands without difficulty    Skin Assessment  Skin Assessment See Nursing Documentation    Balance  Sitting Balance: Static  FAIR       Maintains static position without assist or device, may require Supervision or Verbal Cues (<2 minutes)   Sitting Balance: Dynamic  FAIR-     Performs dynamic activities through partial range (50-75%) with Contact Guard   Standing Balance: Static FAIR       Maintains static position without assist or device, may require Supervision or Verbal Cues (<2 minutes)   Standing Balance: Dynamic  FAIR-     Performs dynamic activities through partial range (50-75%) with Information systems manager Comment Ax1    ADL: Toilet Training  Independence/Assistance Level Moderate assist   Hygiene Performance maximum assist   Clothing Management maximum assist   Toilet Training Comments Pt had BM on bedpan; able to roll to L and R with mod A to perform peri cleaning (max A)     AM-PAC - Daily Activity IP Short Form  Help needed from another person putting on/taking off regular lower body clothing 2 - A Lot   Help needed from another person for bathing (incl. washing, rinsing, drying) 2 - A Lot   Help needed from another person for toileting (incl. using toilet, bedpan, urinal) 2 - A Lot   Help needed from another person putting on/taking off regular upper body clothing 3 - A Little   Help needed from another person taking care of personal grooming such as brushing teeth 3 - A Little   Help needed from another person eating meals 3 - A Little   AM-PAC Daily Activity Raw Score (Total of rows above) 15   CMS Score (based on Raw Score - with G Code) 15 - 56.46% impaired      (G Code - CK)    Bed Mobility  Supine-to-Sit Independence/Assistance Level Moderate assist;Assist of 2   Supine-to-Sit Assist Device Draw pad;Head of bed elevated    Sit-Stand Transfer Training  Sit-to-Stand Transfer Independence/Assistance Level Minimum assist   Sit-to-Stand Transfer Assist Device Rolling walker   Stand-to-Sit Transfer Independence/Assistance Level Minimum assist   Stand-to-Sit Transfer Assist Device  Rolling walker   Occupation-Based Indications: Sit to Stand Transfers To perform grooming tasks standing;To simulate toilet transfers   Sit-Stand Transfer Comments x3 trials, began session with stedy and progressed to RW with seated rest break in btwn    Functional Ambulation  Independence/Assistance Level  Minimum assist   Assistive Device  Rolling walker   Gait Distance 10 feet   Occupation-based Indications:  Functional Ambulation In preparation for mobility to bathroom for toileting tasks;To improve activity tolerance for daily routines;To improve balance and increase safety with daily routines    Handoff Documentation  Handoff Patient in chair;Hook and loop belt on - patient return demonstration;Discussed with nursing;Patient instructed to call nursing for mobility    Therapeutic Functional Activity  Interventions to support occupations Engaged in functional transfers in preparation for ADL tasks;Engaged in balance tasks to improve ability to complete ADL tasks;Challenged activity tolerance to increase independence with ADLs    Clinical Impression  Follow up Assessment Pt tol OT session well. Pt noticably inc in activity tolerance and balance progressing from stedy to RW with min A. Cont to rec mod complexity upon d/c    Patient/Family Stated Goals  Patient/Family Stated Goal(s) decrease pain    Frequency/Equipment Recommendations  OT Frequency 5x per week   Next Treatment Expected 03/11/24   OT/OTA completing this assessment Louretta Tantillo    OT Recommendations for Inpatient Admission  ADL Recommendations assist of 2;bedside commode;assist to bathroom;with rolling walker    Planned Treatment / Interventions  Education Treatment / Interventions Patient Education / Training    OT Discharge Summary  Disposition Recommendation Moderate complexity support and therapy to progress functional mobility/ ADLs/ IADLs recommended for post- acute care.  See assessment for additional details.     Lennox Racer, OTR/L

## 2024-03-09 NOTE — Plan of Care
 Plan of Care Overview/ Patient Status1900-0700: Pt. A&O 4 o/n, one occurrence of confusion. VSS on RA. Assist x 2 w/ sera stedy. Tolerating regular diet no n/v. 1 BM this shift. Foley removed approx 2330. Skin as documented on FS. Pain controlled w/ ATC tylenol  and PRN oxy w/ + effect. See FS/MAR for more details. Problem: Adult Inpatient Plan of CareGoal: Optimal Comfort and WellbeingOutcome: Interventions implemented as appropriate Problem: Adult Inpatient Plan of CareGoal: Readiness for Transition of CareOutcome: Interventions implemented as appropriate Problem: Fall Injury RiskGoal: Absence of Fall and Fall-Related InjuryOutcome: Interventions implemented as appropriate

## 2024-03-09 NOTE — Other
 Brief consult note:Please ensure patient is able to urinate on her own after dc foleyOn discharge, please decrease Vit D from 800IU to 400IU. Medicine will sign off. Please re-consult for any questions. Geraldene Kleine, MD

## 2024-03-10 ENCOUNTER — Encounter: Admit: 2024-03-10 | Payer: PRIVATE HEALTH INSURANCE | Primary: Internal Medicine

## 2024-03-10 DIAGNOSIS — F329 Major depressive disorder, single episode, unspecified: Secondary | ICD-10-CM

## 2024-03-10 DIAGNOSIS — F05 Delirium due to known physiological condition: Secondary | ICD-10-CM

## 2024-03-10 DIAGNOSIS — Z96653 Presence of artificial knee joint, bilateral: Secondary | ICD-10-CM

## 2024-03-10 DIAGNOSIS — Z7901 Long term (current) use of anticoagulants: Secondary | ICD-10-CM

## 2024-03-10 DIAGNOSIS — M6282 Rhabdomyolysis: Secondary | ICD-10-CM

## 2024-03-10 DIAGNOSIS — M9701XA Periprosthetic fracture around internal prosthetic right hip joint, initial encounter: Secondary | ICD-10-CM

## 2024-03-10 DIAGNOSIS — E785 Hyperlipidemia, unspecified: Secondary | ICD-10-CM

## 2024-03-10 DIAGNOSIS — Z781 Physical restraint status: Secondary | ICD-10-CM

## 2024-03-10 DIAGNOSIS — Z79899 Other long term (current) drug therapy: Secondary | ICD-10-CM

## 2024-03-10 DIAGNOSIS — G629 Polyneuropathy, unspecified: Secondary | ICD-10-CM

## 2024-03-10 DIAGNOSIS — D62 Acute posthemorrhagic anemia: Secondary | ICD-10-CM

## 2024-03-10 DIAGNOSIS — G9341 Metabolic encephalopathy: Secondary | ICD-10-CM

## 2024-03-10 DIAGNOSIS — S72001A Fracture of unspecified part of neck of right femur, initial encounter for closed fracture: Secondary | ICD-10-CM

## 2024-03-10 DIAGNOSIS — F17200 Nicotine dependence, unspecified, uncomplicated: Secondary | ICD-10-CM

## 2024-03-10 DIAGNOSIS — N179 Acute kidney failure, unspecified: Secondary | ICD-10-CM

## 2024-03-10 DIAGNOSIS — D696 Thrombocytopenia, unspecified: Secondary | ICD-10-CM

## 2024-03-10 MED ORDER — APIXABAN 2.5 MG TABLET
2.5 | Freq: Two times a day (BID) | ORAL | 4.00 refills | 30.00000 days | Status: AC
Start: 2024-03-10 — End: ?

## 2024-03-10 MED ORDER — BUPROPION HCL XL 150 MG 24 HR TABLET, EXTENDED RELEASE
150 | Freq: Every day | ORAL | 3.00 refills | 30.00000 days | Status: AC
Start: 2024-03-10 — End: ?

## 2024-03-10 MED ORDER — ACETAMINOPHEN 325 MG TABLET
325 | Freq: Four times a day (QID) | ORAL | 1.00 refills | 4.00000 days | Status: AC | PRN
Start: 2024-03-10 — End: ?

## 2024-03-10 MED ORDER — GABAPENTIN 100 MG CAPSULE
100 | Freq: Every day | ORAL | 2.00 refills | 30.00000 days | Status: AC
Start: 2024-03-10 — End: ?

## 2024-03-10 MED ORDER — SENNOSIDES 8.6 MG TABLET
8.6 | Freq: Two times a day (BID) | ORAL | 12.00 refills | 30.00000 days | Status: AC
Start: 2024-03-10 — End: ?

## 2024-03-10 MED ORDER — CHOLECALCIFEROL (VITAMIN D3) 10 MCG (400 UNIT) TABLET
10 | Freq: Every day | ORAL | 8.00 refills | 30.00000 days | Status: AC
Start: 2024-03-10 — End: ?

## 2024-03-10 MED ORDER — CEFADROXIL 500 MG CAPSULE
500 | Freq: Two times a day (BID) | ORAL | 1.00 refills | 2.00000 days | Status: AC
Start: 2024-03-10 — End: ?

## 2024-03-10 MED ORDER — OXYCODONE IMMEDIATE RELEASE 5 MG TABLET
5 | Freq: Four times a day (QID) | ORAL | 1.00 refills | 4.00000 days | Status: AC | PRN
Start: 2024-03-10 — End: ?

## 2024-03-10 NOTE — Discharge Summary
 [facility]SrcMed/Surg Discharge SummaryPatient Data:  Patient Name: Holly Erickson Admit date: 03/05/2024 Age: 83 y.o. Discharge date: 03/10/2024  DOB: [birth year]	 Discharge Attending Physician: Leonor Ramsay  MRN: [id number]	 Discharged Condition: stable PCP: Jene Minors, MD  Disposition: Skilled Nursing Facility for Short Term Rehab Principal Diagnosis: right periprosthetic hip fracture s/p mechanical fall. status post Right total hip revision and orif proximal femur 03/07/2024 Comorbidities Comorbidities present on admission:Thrombocytopenia noted on admission.   Secondary diagnoses occurring during hospitalization:HypocalcemiaHypophosphatemiaThrombocytopenia  Post Discharge Follow Up Items: Issues to be Addressed Post Discharge:F/U Dr Clayborn Cunas RLE DVT prophylaxis:Eliquis  2.5mg  BID x's 42 days post op Pending Labs and Tests: Pending Lab Results   Order Current Status  Basic metabolic panel Collected (03/05/24 2118)  Magnesium  Collected (03/05/24 2118)  Phosphorus Collected (03/05/24 2118)  Uric acid Collected (03/05/24 2118)  Prosthetic joint culture (sonication)     (YH) In process  Quetiapine In process  Tissue culture     [facility]) Preliminary result  Follow-up Information:[place], Lovenia Ruby [facility] [phone] an appointment as soon as possible for a visit in 2 week(s)Post-operative appointmentWHITNEY REHAB[facility] [id number]Hamden Laurel  [phone] Future Appointments Date Time Provider Department Center 03/27/2024  9:45 AM Charm Coombs, DO ORTH [place] [facility] 04/16/2024  2:10 PM Margeret Sheer, MD DERM [facility] 08/05/2024 11:45 AM Marla Sills, MD [facility] Course: Hospital CourseThe patient is a 83 y.o. female who was admitted to [facility] campus on 03/06/2024 after sustaining a mechanical fall and found to have a right periprosthetic hip fracture. Patient admitted for further intervention. Went to the OR on 5/2 for revision right THA and ORIF femurSurgeon: Dr. Serina Dane Date of Surgery: 03/07/2024 Intra-Op Complications: None notedAcute events in the during the hospital stay:5/2- HG 7.0- transfused 1 unitof PRBC's- post transfusion HCT 9.5Consults:  (Consulting physician, problem, recs, f/u info)Medicine consult- for pre operative risk stratification Bladder Function Concerns:	None, voidingDVT Prophylaxis:  (Medication, duration, lab monitoring)	Eliquis  2.5mg  BID x35 days post op, SCD's, early ambulationHome Medication Changes:  (with explanation) See med rec Weight Bearing Status:             WBAT RLE Data: Pertinent lab findings:Recent Labs Lab 05/02/251739 05/03/250613 05/04/250936 WBC 6.9 7.4 8.2 HGB 9.1* 8.1* 7.4* HCT 27.30* 23.90* 22.90* PLT 145* 160 175  Recent Labs Lab 05/02/251739 05/03/250613 05/04/250936 NEUTROPHILS 91.5* 81.3* 70.2  Recent Labs Lab 05/02/251739 05/03/250009 05/03/250613 05/04/250936 NA 138  --  141 142 K 5.0  --  5.2 4.2 CL 107  --  111* 113* CO2 21  --  17* 22 BUN 18  --  20 19 CREATININE 0.80  --  0.80 0.70 GLU 139*   < > 123* 116* ANIONGAP 10  --  13 7  < > = values in this interval not displayed.  Recent Labs Lab 05/02/251739 05/03/250613 05/04/250936 CALCIUM 8.2* 8.0* 8.0* MG  --  2.0 2.0 PHOS  --  3.8 2.1*  Recent Labs Lab 05/01/251753 05/02/251432 05/03/250613 ALT 83* 76* 57* AST 154* 119* 77* ALKPHOS 63 59 52 BILITOT 0.3 0.4 0.3 BILIDIR 0.1  --   --   Recent Labs Lab 04/30/250631 PTT 32.2* LABPROT 11.6 INR 1.08  Microbiology:Recent Labs Lab 04/30/251157 LABURIN 10,000-49,000 CFU/mL Beta-Hemolytic Streptococcus Group B* Imaging: Imaging results available in EpicDiet:  Diet RegularMobility: Highest Level of mobility - ACTUAL: Mobility Level 2, Turn self in bed/bed activity/dependent transfer, AM PAC 6-7Physical Therapy Disposition Recommendation: Moderate complexityAdditional Therapy Recommendations: Physical Therapy Services in  Discharge EnvironmentPhysical Exam Discharge vital signs: Vitals:  03/10/24 1151 BP: 126/64 Pulse: 88 Resp: 16 Temp: 98.3 ?F (36.8 ?C) Cognitive Status at Discharge: BaselinePhysical ExamCompleted on day of discharge. Right hip prevena holding suction and c/d/INVI distallyNo calf tenderness,supple bilateralHistory  Allergies Allergies     Noted Reactions  Fentanyl  08/25/2012   Dizziness, Itching, Other (See Comments), Rash, Unknown  PATCH ONLY.. Oral form is oksweating duragesic  patch only; can tolerate IV Itching.  Duragesic  patch.sweating duragesic  patch only; can tolerate IV Itching.  Duragesic  patch.  PATCH ONLY.. Oral form is ok  Nsaids (non-steroidal Anti-inflammatory Drug) 06/23/2013   Diarrhea, Other (See Comments)  Abdominal cramping. horrible cramps.  Sodium Pentothal [thiopental Sodium] 08/02/2018     Childhood    PMH PSH Past Medical History: Diagnosis Date  Arrhythmia   Colon polyp   Constipation   Depression   Hyperlipidemia   Past Surgical History: Procedure Laterality Date  APPENDECTOMY    BACK SURGERY    COLONOSCOPY  03/11/2018  [facility]  HERNIA REPAIR    HYSTERECTOMY    OOPHORECTOMY Bilateral   PARATHYROIDECTOMY    REPLACEMENT TOTAL HIP W/  RESURFACING IMPLANTS Left   REPLACEMENT TOTAL KNEE BILATERAL    RHINOPLASTY    ROTATOR CUFF REPAIR Right   TONSILLECTOMY    Social History Family History Social History Tobacco Use  Smoking status: Every Day  Smokeless tobacco: Not on file Substance Use Topics  Alcohol use: Not Currently  Family History Problem Relation Age of Onset  Coronary Artery Disease Mother   Ulcers Father   Coronary Artery Disease Father     Discharge Medications  Discharge: Current Discharge Medication List  START taking these medications  Details acetaminophen  (TYLENOL ) 325 mg tablet Take 2 tablets (650 mg total) by mouth every 6 (six) hours as needed for pain (mild pain).Start date: 03/10/2024  apixaban  (ELIQUIS ) 2.5 mg tablet Take 1 tablet (2.5 mg total) by mouth every 12 (twelve) hours.Start date: 03/10/2024, End date: 04/11/2024  buPROPion  XL (WELLBUTRIN  XL) 150 mg 24 hr tablet Take 1 tablet (150 mg total) by mouth daily.Start date: 03/10/2024  cefadroxil (DURICEF) 500 mg capsule Take 1 capsule (500 mg total) by mouth 2 (two) times daily for 4 days.Start date: 03/10/2024, End date: 03/14/2024  oxyCODONE  (ROXICODONE ) 5 mg Immediate Release tablet Take 0.5 tablets (2.5 mg total) by mouth every 6 (six) hours as needed (moderate to severe post-op pain).Start date: 03/10/2024  senna (SENOKOT) 8.6 mg tablet Take 2 tablets (17.2 mg total) by mouth 2 (two) times daily.Start date: 03/10/2024   CONTINUE these medications which have CHANGED  Details cholecalciferol , vitamin D3, 10 mcg (400 unit) tablet Take 1 tablet (400 Units total) by mouth daily.Start date: 03/10/2024  gabapentin  (NEURONTIN ) 100 mg capsule Take 2 capsules (200 mg total) by mouth Daily @1800 .Start date: 03/10/2024   CONTINUE these medications which have NOT CHANGED  Details rosuvastatin  (CRESTOR ) 40 mg tablet Take 1 tablet (40 mg total) by mouth daily.  bisacodyL  (DULCOLAX) 10 mg suppository Place 1 suppository (10 mg total) rectally daily as needed for constipation.Qty: 12 suppository  camphor-menthoL  (SARNA) 0.5-0.5 % lotion Apply topically as needed for itching. Keep in fridge.Qty: 222 mL, Refills: 2  Associated Diagnoses: Pruritus DULoxetine  (CYMBALTA ) 60 MG capsule Take 1 capsule (60 mg total) by mouth daily.  enzymes,digestive (DIGESTIVE ENZYMES ORAL) Take by mouth daily.  melatonin 5 mg tablet Take 1 tablet (5 mg total) by mouth nightly.  polyethylene glycol (MIRALAX ) 17 gram packet Take 1 packet (  17 g total) by mouth daily. Mix in 8 ounces of water, juice, soda, coffee or tea prior to taking.Qty: 14 each, Refills: 2   STOP taking these medications   triamcinolone  (KENALOG ) 0.1 % ointment    ascorbic acid (VITAMIN C ORAL)    buPROPion  SR (WELLBUTRIN  SR) 100 mg 12 hr tablet    zinc  gluconate 50 mg tablet      25 minutes spent on the discharge of this patientElectronically Signed:Electronically Signed by [name], PA, May 5, 2025SRC Joint Service can be contacted on [facility] Joint Serv Prov Dynamic Role at [phone]

## 2024-03-10 NOTE — Plan of Care
 Holly Erickson to be discharged via Ambulance accompanied by Alone.  Verbalized understanding of discharge instructionsand recommended follow up care as per the after visit summary.  Written discharge instructions provided. Denies any further questions. Report given to Tasha at [name] Pt has no code bracelet in place.  Vital signs    Vitals:  03/09/24 2021 03/10/24 0537 03/10/24 0824 03/10/24 1151 BP: 112/65 126/68 112/65 126/64 Pulse: (!) 92 86 69 88 Resp: 18 18 16 16  Temp: 98.2 ?F (36.8 ?C) 97.5 ?F (36.4 ?C) 97.6 ?F (36.4 ?C) 98.3 ?F (36.8 ?C) TempSrc: Oral Oral Oral Oral SpO2: 94% 96% 95% 97% Patient confirmed all belongings returned. Belongings charted in last 7 days: Patient Valuables   Patient Valuables Flowsheet                    PATIENT VALUABLE(S)       Denture Disposition In denture cup  picked up by EJ nurse from 4N 03/07/24 1025

## 2024-03-10 NOTE — Plan of Care
 Inpatient Occupational Therapy Progress NoteDefault Flowsheet Data (most recent)   IP Adult OT Eval/Treat - 03/10/24 1113    Date of Visit / Treatment  Date of Visit / Treatment 03/10/24   Note Type Daily Note   Progress Report Due 03/22/24   Start Time 1035   End Time 1113   Total Treatment Time 38    General Information  Subjective agreeable   General Observations Pt recieved supine in bed, BSCDs in place, son at bedside, RN cleared tx and premedicated   Precautions/Limitations Fall Precautions;Bed alarm;Chair alarm;Right hip precautions;Anterior hip   Precautions/Limitations Comment WBAT RLE    Weight Bearing Status  Weight Bearing Status Comments WBAT RLE    Vital Signs and Orthostatic Vital Signs  Vital Signs Free text RA, NAD    Pain/Comfort  Pain Comment (Pre/Post Treatment Pain) Pt reports Mod R hip pain with mob, RN aware    Cognition  Level of Consciousness alert   Following Commands Follows one step commands without difficulty   Personal Safety / Judgment Fall risk    Skin Assessment  Skin Assessment See Nursing Documentation    Balance  Sitting Balance: Static  FAIR       Maintains static position without assist or device, may require Supervision or Verbal Cues (<2 minutes)   Sitting Balance: Dynamic  FAIR-     Performs dynamic activities through partial range (50-75%) with Contact Guard   Standing Balance: Static POOR+    Minimal assist to maintain static position with no Assistive Device   Standing Balance: Dynamic  POOR+   Moves through 1/2 range with minimal assist to right self   Balance Assist Device Rolling walker    ADL: Toilet Training  Independence/Assistance Level Minimum assist;Assist of 1   Assistive Device Rolling walker;Raised toilet seat;Grab bar(s)   Hygiene Performance dependent   Independence limited by Requires assist x1 person;Pain;Weakness;Impaired balance   Activity Analysis Able to ambulate to bathroom for toileting needs    ADL: Grooming  Independence/Assistance Level Set-up required;Stand-by assist   Assistive Device No device   Grooming Activities brush teeth;wash hands;sitting at sink   Activity Analysis Unable to complete in standing;Demonstrates balance deficits impacting ability to complete grooming task standing at sink side   Grooming Comments required sitting due to faitigue     AM-PAC - Daily Activity IP Short Form  Help needed from another person putting on/taking off regular lower body clothing 2 - A Lot   Help needed from another person for bathing (incl. washing, rinsing, drying) 2 - A Lot   Help needed from another person for toileting (incl. using toilet, bedpan, urinal) 2 - A Lot   Help needed from another person putting on/taking off regular upper body clothing 3 - A Little   Help needed from another person taking care of personal grooming such as brushing teeth 3 - A Little   Help needed from another person eating meals 4 - None   AM-PAC Daily Activity Raw Score (Total of rows above) 16   CMS Score (based on Raw Score - with G Code) 16 - 53.32% impaired      (G Code - CK)    Bed Mobility  Supine-to-Sit Independence/Assistance Level Minimum assist;Assist of 2   Supine-to-Sit Assist Device Head of bed elevated;Draw pad;Bed rails   Bed Mobility, Impairments ROM decreased;strength decreased;decreased endurance/activity tolerance;impaired balance;pain   Bed Mobility Comments required cues for sequencing, assist to advance RLE OOB    Training and development officer  Sit-to-Stand Transfer Independence/Assistance Level Contact guard;Minimum assist   Sit-to-Stand Transfer Assist Device Rolling walker   Stand-to-Sit Transfer Independence/Assistance Level Contact guard   Stand-to-Sit Transfer Assist Device Rolling walker   Transfer Safety Analysis Concerns cues for hand placement   Occupation-Based Indications: Sit to Stand Transfers To perform grooming tasks standing;To complete toilet/commode transfers;To complete toilet hygiene in standing   Limitations to Sit to Stand Transfers for Occupation-Based Tasks Pain;Balance deficits   Sit-Stand Transfer Comments multiple trials, tol improved with trails    Functional Ambulation  Independence/Assistance Level  Minimum assist;Assist of 1 person + 1 person to manage equipment   Assistive Device  Rolling walker   Gait Distance 10 feet;x2   Gait Analysis Deviations increased time in double stance;decreased toe-to-floor clearance   Gait Analysis Impairments impaired balance;decreased flexibility;decreased endurance/activity tolerance;decreased strength;pain   Occupation-based Indications:  Functional Ambulation In preparation for mobility to bathroom for toileting tasks;In preparation to complete grooming tasks;To improve activity tolerance for daily routines;To improve balance and increase safety with daily routines   Functional Ambulation Comments chair follow, easily fatigues    Handoff Documentation  Handoff Patient in chair;Hook and loop belt on - patient return demonstration;Pressure relief cushion;Patient instructed to call nursing for mobility;Discussed with nursing    Therapeutic Functional Activity  Interventions to support occupations Engaged in functional transfers in preparation for ADL tasks;Engaged in balance tasks to improve ability to complete ADL tasks;Challenged activity tolerance to increase independence with ADLs   Therapeutic Functional Activity Comments increased time for ADLs    Clinical Impression  Follow up Assessment Pt tol OT tx well, now requires assist x1 for ADLs and func mob at RW level. Limited by impiared balance, dec strength, dec act tol. cont to rec moderate complexity support when medically ready.   Rehab Potential good, to achieve stated therapy goals    Patient/Family Stated Goals  Patient/Family Stated Goal(s) get stronger    Frequency/Equipment Recommendations  OT Frequency 5x per week   Next Treatment Expected 03/11/24   OT/OTA completing this assessment LG    OT Recommendations for Inpatient Admission  ADL Recommendations assist of 1;assist to bathroom;with rolling walker    Education - Learning Assessment  Education Topic Functional mobility techniques;ADL techniques;Precautions;Safety;Weight bearing;Therapy role/rehab process;Energy conservation   Learners Patient   Readiness Eager   Method Explanation   Response Verbalizes Understanding;Demonstrated Understanding    OT Discharge Summary  Disposition Recommendation Moderate complexity support and therapy to progress functional mobility/ ADLs/ IADLs recommended for post- acute care.  See assessment for additional details.   Equipment Recommendations for Discharge To be determined pending progress     Holly Erickson, Holly Erickson of Care Overview/ Patient Status

## 2024-03-10 NOTE — Plan of Care
 Plan of Care Overview/ Patient StatusVitals: Temp:  [97.5 ?F (36.4 ?C)-98.4 ?F (36.9 ?C)] 97.6 ?F (36.4 ?C)Pulse:  [69-92] 69Resp:  [16-18] 16BP: (112-126)/(64-68) 112/65SpO2:  [94 %-97 %] 95 %Device (Oxygen Therapy): room airNeuros/Peripherals: Pt is alert and oriented x4; pt is HOH; forgetful at times.Pt denies numbness/ tingling.+dorsi/ plantar flexion+circulation, motion, and sensationCalf tenderness: Pt denies. Pain: See MAR and pain flowsheet. Tylenol / Oxycodone  ordered Cardiac:  Pt denies c/o chest pain. Respiratory: Pt denies SOB.  Incentive spirometer encouraged; education provided. Diet: Pt tolerating regular diet.  Pt denies nausea, vomiting.  MEDS:  Crushed with applesauce MSK: q2 turn and reposition encouraged. not out of bed/ bedrest. PT/ OT to see pt.Anti-infective: keflexGI: last bowel movement: 03/10/24; See MAR and I&Os flowsheet.GU: Pt is voiding incont of urine.Skin: RLE/ Hip area with purple foam and teg/ prevenaDrains:  prevenaDVT: eliquis  SCD ordered.Plan: See Provider's notes. Safety maintained, call bell within reach.  See flowsheets for details. \

## 2024-03-10 NOTE — Med Student Progress Note
 BRIEF ORTHOPEDIC PROGRESS NOTE3 Days Post-Op from revision R THA and ORIF femur done on 03/07/2024 by Dr. Serina Dane.SubjectiveLynn Erickson was examined at bedside. Laying comfortably in bed, in NAD. Patient reports her pain is well controlled, but does have some discomfort with movement. Denies new radiating pain, new numbness/tingling, new loss of sensations, new weakness, calf tenderness, abdominal pain, N/V, CP, SOB. Tolerating diet well, voiding spontaneously via purewick, or to toilet as tolerated. Last BM 03/09/2024. Physical ExamTemp:  [97.5 ?F (36.4 ?C)-98.4 ?F (36.9 ?C)] 97.5 ?F (36.4 ?C)Pulse:  [86-92] 86Resp:  [18] 18BP: (112-126)/(64-68) 126/68SpO2:  [94 %-97 %] 96 %Device (Oxygen Therapy): room airPE: Lungs: normal work of breathing on RA RLE: Prevena holding suction, c/d/I + intact tibialis anterior, gastroc, EHL/FHLSensation grossly intact distallyThigh/Calves SNT, compressibleDP pulse present bilaterally, Toes WWP, Capillary refill <2secondsNeuro/Psych: alert and oriented, answering questions appropriatelyLabs: Results in Past 7 DaysResult Component Current Result Hematocrit 22.90 (L) (03/09/2024) Hemoglobin 7.4 (L) (03/09/2024) MCH 31.9 (03/09/2024) MCHC 32.3 (03/09/2024) MCV 98.7 (03/09/2024) MPV 9.8 (03/09/2024) Platelets 175 (03/09/2024) RBC 2.32 (L) (03/09/2024) WBC 8.2 (03/09/2024)   Chemistry  Lab Results Component Value Date  NA 142 03/09/2024  K 4.2 03/09/2024  CL 113 (H) 03/09/2024  CO2 22 03/09/2024  BUN 19 03/09/2024  CREATININE 0.70 03/09/2024  GLU 116 (H) 03/09/2024  Lab Results Component Value Date  CALCIUM 8.0 (L) 03/09/2024  ALKPHOS 52 03/08/2024  AST 77 (H) 03/08/2024  ALT 57 (H) 03/08/2024  BILITOT 0.3 03/08/2024   CrCl cannot be calculated (Unknown ideal weight.).Images:[place] wo IV ContrastResult Date: 4/30/2025No acute intracranial abnormality. Please note that Noncontrast Head [place] is not sensitive for the detection of ischemic infarct. If ischemic infarct is of clinical concern, additional clinical or imaging evaluation is recommended. [place] Radiology Notify System Classification: Routine.  Report initiated by:  Leota Randy, MD Reported and signed by: Elissa Guise, MD  [facility] Radiology and Biomedical Imaging XR Chest PA or APResult Date: 03/05/2024   No acute cardiothoracic abnormality. A radiopaque foreign body projecting over the right heart shadow likely external to the patient; recommend correlation with direct inspection. [place] Radiology Notify System Classification: Routine.  Report initiated by:  Reather Campbell, MD Reported and signed by: Mai Schwalbe, MD  [facility] Radiology and Biomedical Imaging XR Hip Right AP and LateralResult Date: 4/30/2025Avulsion fracture of the right lesser trochanter with additional lucencies in the greater trochanter raising concern for right intertrochanteric/periprosthetic fracture. [place] Radiology Notify System Classification: Routine. (accession [id number]), Routine. (accession [id number]) Report initiated by:  Reather Campbell, MD Reported and signed by: Mai Schwalbe, MD  [facility] Radiology and Biomedical Imaging XR Femur Right AP and LateralResult Date: 4/30/2025Avulsion fracture of the right lesser trochanter with additional lucencies in the greater trochanter raising concern for right intertrochanteric/periprosthetic fracture. [place] Radiology Notify System Classification: Routine. (accession [id number]), Routine. (accession [id number]) Report initiated by:  Reather Campbell, MD Reported and signed by: Mai Schwalbe, MD  [facility] Radiology and Biomedical Imaging  Drain: Drain(s) and last filed assessment:  Culture: Susceptibility data from last 90 days.Collected Specimen Info Organism 03/05/24 Urine Beta-Hemolytic Streptococcus Group B Assessment: This is a 83 y.o. female who is 3 Days Post-Op s/p revision R THA and ORIF femur with Dr. Serina Dane. Stable on floor at time of exam. Past Medical History Past Medical History: Diagnosis Date  Arrhythmia   Colon polyp   Constipation   Depression   Hyperlipidemia  Plan: - WBAT to RLE: Out of bed with PT/OT- VTE Prophylaxis: Eliquis , SCDs, OOBA- Continue home medications- Pain control-  Diet: Regular - Bowel regimen. Last BM 03/09/2024 - Dressing: Prevena in place, holding suction  - Drain: none- Incentive spirometry- Will observe acute blood loss anemia and monitor vital signs, no indication for pRBC transfusion at this time- Dispo planning: pending STR bed - Expected Discharge Date: 03/10/24 Electronically Signed by [name], STUDENT, May 5, 2025Electronically Signed by [name], PA, May 5, 2025[facility] Joint Service can be contacted on [facility] Joint Serv Prov Dynamic Role at [phone]

## 2024-03-10 NOTE — Plan of Care
 Problem: Adult Inpatient Plan of CareGoal: Readiness for Transition of CareOutcome: Interventions implemented as appropriate Plan of Care Overview/ Patient StatusMet with pt and son to disucss dc STR options. Pt accepted Holly Erickson. Now pending Burgess Caroline RN, BSNCase ManagerYale Middlesboro Arh Hospital

## 2024-03-10 NOTE — Plan of Care
 Problem: Adult Inpatient Plan of CareGoal: Readiness for Transition of Care5/03/2024 1440 by Maple Seltzer, RNOutcome: Outcome(s) achieved Plan of Care Overview/ Patient StatusNow medically cleared for dc to go to [facility] via ambulance. Pt / son in agreement with dc plans. Case Management Plan  Flowsheet Row Most Recent Value Discharge Planning  Patient/Patient Representative goals/treatment preferences for discharge are:  STR Patient/Patient Representative was presented with a list of facilities, agencies and/or dme providers and Referral(s) placed for: Short term rehabilitation (at a nursing facility) Facility Name [facility] Mode of Transportation  Ambulance (add comment for special considerations) Transportation Company [facility]/Access Discharge Readiness  PASRR completed and approved Yes  [5/4] Date PASRR Completed 03/09/24 Authorization number obtained, if required Yes Is there a 3 day INPATIENT Qualifying stay for Medicare Patients? Yes  [5/5] Medicare IM- signed, dated, timed and scanned, if required Yes  [5/5] DME Authorized/Delivered N/A No needs identified/ follow up with PCP/MD/Outpatient Provider N/A Post acute care services secured W10 complete Yes PRI Completed and Accepted ([place]  Patients Only) N/A Is the destination address correct on the W10 Yes Last Bowel Movement 03/08/24 Finalized Plan  Expected Discharge Date 03/10/24 Discharge Disposition Skilled Nursing Facility   Maple Seltzer RN, [facility] ManagerYale Eye Surgery Center Of [facility]

## 2024-03-10 NOTE — Plan of Care
 Inpatient Physical Therapy Progress Note IP Adult PT Eval/Treat - 03/10/24 1044    Date of Visit / Treatment  Date of Visit / Treatment 03/10/24   Note Type Progress Note   Progress Report Due 03/18/24   Start Time 1006   End Time 1044   Total Treatment Time 38   Co-Treatment Performed Co-treatment performed with separate and distinct therapeutic interventions between PT and OT for concurrent facilitation of balance, postural control, mobility and/or ADL training    General Information  Subjective pt agreeable   General Observations supine, RA, NAD   Precautions/Limitations Fall Precautions;Right hip precautions;Anterior hip   Precautions/Limitations Comment WBAT RLE    Weight Bearing Status  Weight Bearing Status Comments WBAT RLE    Vital Signs and Orthostatic Vital Signs  Vital Signs Free text RA, NAD    Cognition  Level of Consciousness alert   Following Commands Follows all commands and directions without difficulty    Skin Assessment  Skin Assessment See Nursing Documentation    Balance  Sitting Balance: Static  FAIR       Maintains static position without assist or device, may require Supervision or Verbal Cues (<2 minutes)   Sitting Balance: Dynamic  FAIR-     Performs dynamic activities through partial range (50-75%) with Contact Guard   Standing Balance: Static POOR+    Minimal assist to maintain static position with no Assistive Device   Standing Balance: Dynamic  POOR+   Moves through 1/2 range with minimal assist to right self   Balance Assist Device Rolling walker    Bed Mobility  Supine-to-Sit Independence/Assistance Level Minimum assist;Assist of 2   Supine-to-Sit Assist Device Head of bed elevated;Bed rails;Draw pad    Sit-Stand Transfer Training  Sit-to-Stand Transfer Independence/Assistance Level Contact guard;Minimum assist;Assist of 1   Sit-to-Stand Transfer Assist Device Rolling walker   Stand-to-Sit Transfer Independence/Assistance Level Contact guard   Stand-to-Sit Transfer Assist Device Rolling walker   Transfer Safety Analysis Concerns cues for hand placement   Sit-Stand Transfer Comments x3 trials; dec assist required with repeat attempts    Gait Training  Independence/Assistance Level  Minimum assist;Assist of 1 person + 1 person to manage equipment   Assistive Device  Rolling walker   Gait Distance 10 feet;x2   Gait Pattern Analysis step to gait   Gait Analysis Deviations increased time in double stance;decreased toe-to-floor clearance   Gait Analysis Impairments pain;decreased strength;decreased endurance/activity tolerance   Gait Training Comments 1 seated rest break required; cues to sequence required    Handoff Documentation  Handoff Patient in chair;Hook and loop belt on - patient return demonstration;Patient instructed to call nursing for mobility;Discussed with nursing    PT- AM-PAC - Basic Mobility Screen- How much help from another person do you currently need.....  Turning from your back to your side while in a a flat bed without using rails? 3 - A Little - Requires a little help (supervision, minimal assistance). Can use assistive devices.   Moving from lying on your back to sitting on the side of a flat bed without using bed rails? 2 - A Lot - Requires a lot of help (maximum to moderate assistance). Can use assistive devices.   Moving to and from a bed to a chair (including a wheelchair)? 3 - A Little - Requires a little help (supervision, minimal assistance). Can use assistive devices.   Standing up from a chair using your arms(e.g., wheelchair or bedside chair)? 3 - A Little - Requires a  little help (supervision, minimal assistance). Can use assistive devices.   To walk in a hospital room? 3 - A Little - Requires a little help (supervision, minimal assistance). Can use assistive devices.   Climbing 3-5 steps with a railing? 1 - Total - Requires total assistance or cannot do it at all.   AMPAC Mobility Score 15   TARGET Highest Level of Mobility Mobility Level 4, Transfer to chair   ACTUAL Highest Level of Mobility Mobility Level 7, Walk 25+ feet    Therapeutic Functional Activity  Therapeutic Functional Activity Comments inc time for toileting (dependent hygiene); seated ADLs at sink with assist required to steadying cup/basin    Clinical Impression  Follow up Assessment pt tolerated tx fairly well; Ax1+1 and RW provided for all mobility. pt limited by dec endurance, strength, pain, and balance impairments. rec mod complexity support and therapy at d/c.   Criteria for Skilled Therapeutic Interventions Met yes;treatment indicated   Rehab Potential good, to achieve stated therapy goals    Patient/Family Stated Goals  Patient/Family Stated Goal(s) get stronger    Frequency/Equipment Recommendations  PT Frequency Daily   Next Treatment Expected 03/11/24   PT/PTA completing this assessment Waunetta Riggle    PT Recommendations for Inpatient Admission  Activity/Level of Assist ambulate;assist of 2;with rolling walker;in room    PT Discharge Summary  Physical Therapy Disposition Recommendation Moderate complexity support and therapy to progress functional mobility/ ADLs/ IADLs recommended for post- acute care.  See assessment for additional details.   Additional Therapy Recommendations Physical Therapy Services in Discharge Environment     Rheba Cedar, [place]: [phone]

## 2024-03-10 NOTE — Care Coordination-Inpatient
 INSURANCE AUTH OBTAINED FOR STR 03/10/24 1403 Authorization Information Date Authorization Initiated:  03/10/24 Time Authorization Initiated: 1403 Mode Clinical was sent: Portal Facility Name [facility] Authorization yes(Jaime notified) Insurance Company [employer] [place] Name/# Home & [facility] Authorization #/Details [id number], 03/10/2024-03/12/2024, 3 days approved, NRD 03/12/2024 Date Authorization recieved 03/10/24 Time Authorization recieved 1404 Follow up contact necessary Yes Contact Name [facility] Contact phone: [phone] Insurance Co. Fax number: [phone]

## 2024-03-11 LAB — QUETIAPINE (BH GH LMW Q YH): QUETIAPINE: 65.6 ng/mL

## 2024-03-12 LAB — TISSUE CULTURE     (BH GH LMW YH)
BKR GRAM STAIN (ABNORMAL): NONE SEEN
BKR TISSUE CULTURE: NO GROWTH

## 2024-03-24 LAB — PROSTHETIC JOINT CULTURE (SONICATION)     (YH)
BKR GRAM STAIN (ROUTINE): NONE SEEN
BKR PROSTHETIC JOINT CULTURE (SONICATION): NO GROWTH

## 2024-03-24 NOTE — Other
 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 First Assistant Tennis Feinstein PAFirst Assist Documentation:I understand that 1842(b)(7)(D) of the Act generally prohibits Medicare physician fee schedule payment for the services of assistants at surgery in teaching hospitals when qualified residents are available to furnish such services. I certify that the services for which payment is claimed were medically necessary and that NO QUALIFIED RESIDENT WAS AVAILABLE TO PERFORM THE SERVICES. I further understand that these services are subject to post-payment review by a Medicare carrier. During this procedure, the first assistant helped with patient positioning, surgical site preparation, sterile field draping, surgical exposure and retraction, achievement of meticulous hemostasis, instrumentation, along with both deep and superficial joint closure.Anesthesia:  General endotracheal anesthesia with PNB; 30 mL of 0.25 % Marcaine  as Peri-articular InjectionAnesthesiologist: Persaud, Michael Anil, MDgeneral  IV Fluids:  See anesthesia recordEstimated Blood Loss:  800 ml Transfusions: NonePre-Operative IV Medications: 2 Grams IV Ancef , gentamicin , 1 Gram IV TXA x2Urine Output: Bladder Scan Pending Per Nursing ProtocolsDrains: None       Pathology Specimens:  Bone specimen to pathology per protocol * No specimens in log *       Microbiology Specimens:Intra-op Labs (16h ago, onward)   Start     Ordered  03/07/24 1203  Tissue culture     ([facility])  [[id number]]  Once      Question Answer Comment Site: Right  Site Details:  right femur Lab Specific Advisory Note: DO NOT PLACE ORDERS IN COMMENT FIELD  Release to patient Immediate    03/07/24 1202  03/07/24 1200  Prosthetic joint culture (sonication)     [facility])  [[id number]]  Once      Comments: Right hip explanted hardware Question Answer Comment Release to patient Immediate  Site: Right  Site Details: Hip    03/07/24 1200    Implants: Implant Name Type Inv. Item Serial No. Manufacturer Lot No. LRB No. Used Action CABLE 1.7X750MM W CRMP - [id number] Implant CABLE 1.7X750MM W CRMP  Jesus Morones [employer] [id number] Right 1 Implanted STEM FEM S11 STD CLLR CORAIL - [id number] Implant STEM FEM S11 STD CLLR Burtis Case AND JOH[employer]ON [id number] Right 1 Explanted ARTICUL/EZE FEMORAL HEAD CERAMIC 12/14 TAPER 40mm plus 5 Implant   [employer] [id number] Right 1 Explanted CABLE 1.7X750MM W CRMP - [id number] Implant CABLE 1.7X750MM W CRMP  Jesus Morones [employer] [id number] Right 1 Implanted Reclaim Monobloc Revision Femoral Stem 14 Standard Stem High Offset Implant    [id number] Right 1 Replacement Device HEAD FEMORAL HIP - OFFSET 12/14 TPR ARTICUL EZE - [id number] Implant HEAD FEMORAL HIP - OFFSET 12/14 TPR ARTICUL EZE  J [employer] [id number] Right 1 Replacement Device Narrative:Patient was seen and evaluated in the preoperative holding area where the surgical site was marked and the consent verified.  He was brought into the OR and general anesthesia was induced and she was transferred to the regular OR table.  The right lower extremity  was prepped and draped in usual sterile fashion we will perform a time-out was performed verifying the correct patient, allergies, site of the operative procedure against the consent as well as preoperative antibiotics.  The prior direct anterior skin incision was used in his it was extended proximally curving along the iliac crests were approximately 2 cm.  Distally this was extended about 4-5 cm in a curvilinear fashion to the shaft of the femur.  Electrocautery was used for hemostasis and cautious dissection was carried down to the tensor fascia sheath.  The fascial sheath was incised in line and the tensor fascia was mobilized from the fascia and the superior aspect of the neck was palpated and a retractor placed.  The anterior edge of the tensor was released from the outer table of the pelvis approximately 1 cm.  An anterior capsulectomy was performed and retractors were placed on the inferior neck as well as over the anterior edge of the acetabulum.  Scar tissue was excised until there was appropriate mobilization of the proximal femur.  The anterior wall of the femur was directly visualized and there was comminution of the proximal aspect of the metaphysis.  The hip was dislocated and the head removed.  Vice grips were attached to the stem and this was directly impacted and the stem was removed.  We then irrigated the fracture hematoma and visualize the multiple proximal fragments.  We then placed the appropriate retractors around the proximal femur such that straight reamers could be used.  Reaming was carried up to 14 mm in the shaft portion as well as the proximal body.  The Reamer was left in place and we tried both standard and high offset proximal bodies, the high offset gave the most appropriate tension during trialing.  We then removed the trials copiously irrigated the surgical site and impacted the 14 mm stem.  Of note the 14 mm stem did sit slightly higher than the trials and we ended up using a smaller head size then initially trialed.  Once we found the appropriate leg length tension and stability we impacted the final head and reduced the hip.  We cabled the proximal femur.  An incision was made in the ITB band to allow for shuttling of the cable.  This was taken above the gluteal sling taking care to ensure that the Passer remained on bone throughout the cable passing.  We then able to reduce the largest of the 2 fragments back to the bone, the lesser trochanteric fragment was unable to be held in place with a cable fixation.  A cable was tensioned and crimped.  We then performed a final inspection of the surgical site there was good stability good position of the implants and we irrigated surgical site with sterile saline and did a dilute Betadine  lavage soak.  We irrigated after the dilute Betadine  and performed a layered closure of the [employer] interval as well as the Romell Cluster interval with figure-of-eight sutures followed by barbed monofilament.  2-0 Vicryl was used for the subcu layer followed by 3-0 barbed suture with glue and a negative pressure dressing over top.Total length of the incision was approximately 30cm in length and the incisional vac used was a disposable non DME type. AJRR Procedure Documentation:Hip Approach: Direct anterior Complications: None Postoperative Disposition: To PACU, Stable Condition.Postoperative DVT Prophylaxis: Multi-Modal Protocol, with EliquisPostoperative Rehab Protocol: WBAT, ROM as tolerated.  Anterior hip precautions.  RW for Fall Prevention. Postoperative Antibiotic Protocol: - Complete to more dose(s)  of IV Ancef  per usual protocols.- Due to Patient's History of: Aseptic Revision Arthroplasty Case, the patient is felt to be at higher than average risk of prosthetic joint infection.  As result, we will order extended oral antibiotic prophylaxis after 24 hours of IV Ancef  is completed. This will begin with Keflex  500 milligrams p.o. Q6H until discharge then transition to Duricef 500 milligrams PO BID for the balance of 7 days after index procedure to reduce the risk of prosthetic joint infection.Postoperative Surgical Dressing: Negative pressure dressing, Remove Cover dressing in 7 days or whenever the battery runs out.Physician Face to Face Attestation: I certify that I had a face to face encounter with this patient that meets the physician face-to-face encounter requirements for this patient on the date of their joint replacement surgery today, on 03/07/24, and the reason for this encounter is related to the primary reason the patient needs home health care in the immediate period following their joint replacement surgical procedure. By signing this operative note, I verify that the documentation supporting this patient's need for home health services and home bound status is contained in the medical record. I certify, based on my findings, that the following services are medically necessary for home health care: [x]   Nursing Care  [x]  Physical Therapy [x]  Occupational Therapy. Furthermore, I certify that my clinical findings support that this patient is homebound following their joint arthroplasty surgery, as both of the following criteria are met: [x]  (A) the patient must either , because of injury or illness, need the aid of supportive devices such as crutches, cane, wheelchairs, and walkers; the use of special transportation, or the assistance of another person in order to leave their place of residence, OR have a condition such that leaving their place of residence is medically contra-indicated. [x]  (B) a normal inability to leave home must exist AND leaving home must require a considerable and taxing effort.Teaching Physician Attestation:  I was present for patient identification, positioning, draping, surgical exposure, instrumentation, and final prosthesis implantation.  I participated in the fascial closure and then the assistants completed the superficial layers and skin closure with dressing application.  Thus, I was present for all key components of the surgical procedure.

## 2024-03-27 ENCOUNTER — Encounter: Admit: 2024-03-27 | Payer: PRIVATE HEALTH INSURANCE | Primary: Internal Medicine

## 2024-03-27 VITALS — Ht 60.0 in | Wt 101.8 lb

## 2024-03-27 DIAGNOSIS — Z96641 Presence of right artificial hip joint: Secondary | ICD-10-CM

## 2024-03-27 DIAGNOSIS — I499 Cardiac arrhythmia, unspecified: Secondary | ICD-10-CM

## 2024-03-27 DIAGNOSIS — F32A Depression: Secondary | ICD-10-CM

## 2024-03-27 DIAGNOSIS — E785 Hyperlipidemia, unspecified: Secondary | ICD-10-CM

## 2024-03-27 DIAGNOSIS — K5900 Constipation, unspecified: Secondary | ICD-10-CM

## 2024-03-27 DIAGNOSIS — K635 Polyp of colon: Secondary | ICD-10-CM

## 2024-03-27 NOTE — Progress Notes
 ORTHOPAEDICS & REHABILITATION Division of Adult ReconstructionHip & Knee Arthritis, Total Joint Replacement, and Revision [email or web address] Name:	Holly Erickson Number:	[id number] Date of Birth:	October 31, 1942Date of Visit:	03/27/2024  Chief Complaint:  Post-op of the Right HipHPI:Ms. Holly Erickson is a 83 y.o. year old female who comes in today for postoperative check after periprosthetic fracture and internal fixation of the proximal femur.  Overall she is doing well she is progressing with her activities, she still has notable weakness with hip flexion with a seems to be improving with rehab.  No issues with her incision.  Review of Systems:I have reviewed the following ROS at today's visit:Review of Systems  The patient's intake sheet was also reviewed at the time of evaluation.Past Medical History:Past Medical History: Diagnosis Date  Arrhythmia   Colon polyp   Constipation   Depression   Hyperlipidemia   Past Surgical History:Past Surgical History: Procedure Laterality Date  APPENDECTOMY    BACK SURGERY    COLONOS[facility]  [facility]/11/2018  [facility]  HERNIA REPAIR    HYSTERECTOMY    OOPHORECTOMY Bilateral   PARATHYROIDECTOMY    REPLACEMENT TOTAL HIP W/  RESURFACING IMPLANTS Left   REPLACEMENT TOTAL KNEE BILATERAL    RHINOPLASTY    ROTATOR CUFF REPAIR Right   TONSILLECTOMY    Medications:Current Outpatient Medications Medication Sig  acetaminophen  Take 2 tablets (650 mg total) by mouth every 6 (six) hours as needed for pain (mild pain).  apixaban  Take 1 tablet (2.5 mg total) by mouth every 12 (twelve) hours.  bisacodyL  Place 1 suppository (10 mg total) rectally daily as needed for constipation.  buPROPion  XL Take 1 tablet (150 mg total) by mouth daily.  camphor-menthoL  Apply topically as needed for itching. Keep in fridge.  cholecalciferol  (vitamin D3) Take 1 tablet (400 Units total) by mouth daily.  DULoxetine  Take 1 capsule (60 mg total) by mouth daily.  enzymes,digestive (DIGESTIVE ENZYMES ORAL) Take by mouth daily.  gabapentin  Take 2 capsules (200 mg total) by mouth Daily @1800 .  melatonin Take 1 tablet (5 mg total) by mouth nightly.  oxyCODONE  Take 0.5 tablets (2.5 mg total) by mouth every 6 (six) hours as needed (moderate to severe post-op pain).  polyethylene glycol Take 1 packet (17 g total) by mouth daily. Mix in 8 ounces of water, juice, soda, coffee or tea prior to taking.  rosuvastatin  Take 1 tablet (40 mg total) by mouth daily.  senna Take 2 tablets (17.2 mg total) by mouth 2 (two) times daily. No current facility-administered medications for this visit. Allergies:Allergies Allergen Reactions  Fentanyl  Dizziness, Itching, Other (See Comments), Rash and Unknown   PATCH ONLY.. Oral form is oksweating duragesic  patch only; can tolerate IV Itching.  Duragesic  patch.sweating duragesic  patch only; can tolerate IV Itching.  Duragesic  patch.  PATCH ONLY.. Oral form is ok  Nsaids (Non-Steroidal Anti-Inflammatory Drug) Diarrhea and Other (See Comments)   Abdominal cramping. horrible cramps.  Sodium Pentothal [Thiopental Sodium]    Childhood Social History:Social History Socioeconomic History  Marital status: Married Tobacco Use  Smoking status: Every Day Substance and Sexual Activity  Alcohol use: Not Currently Social History Narrative  03/08/24: Pt lives alone in an apartment with elevator access. PTA pt independent with ADLs, amb with RW, tub shower, chair, son helps (not available 24/7) Has been receiving home services. Pt is a poor historian d/t confusion. Social Drivers of Health Food Insecurity: No Food Insecurity (03/05/2024)  Hunger Vital Sign   Worried About Running Out of Food in the Last Year: Never true   Ran Out of  Food in the Last Year: Never true Transportation Needs: No Transportation Needs (03/05/2024)  PRAPARE - Designer, jewellery (Medical): No   Lack of Transportation (Non-Medical): No Housing Stability: Low Risk  (03/05/2024)  Housing Stability   Housing Stability: I have a steady place to live  Occupation: Occupational History  Not on file Physical Exam:Height 5' (1.524 m), weight 46.2 kg.Body mass index is 19.88 kg/m?Holly Matsuo AasGeneral:  No acute distress HEENT: Moist mucous membranes Heart: Regular rate and rhythm Lungs: Symmetric chest rise bilaterallyRight lower extremity: Incision is well healing without erythema or streaking or discharge, there is no palpable fluid collection.Imaging:  No new x-raysFormal Radiology Readings for the past week follow below:No results found.Assessment:  83 year old female status post revision total hip replacement for fracture with ORIF of the proximal femur.No diagnosis found.Plan:The history, examination, and radiographic findings were reviewed in detail with the patient today, and the diagnostic and therapeutic management options were discussed in detail.  Overall doing well, encouraged continuation of activities as tolerated physical therapy and anterior hip precautions for the 1st 6 weeks after surgery.Follow up in the orthopedic clinic in 4 weeks with repeat x-rays of the right hip.Charm Coombs, DOYale Orthopaedics and Rehabilitation	[address]	[id number]	[place], [place]  [address] 	[phone] 		[place] Office Phone	[phone]		Fax Line	[phone]	 	Assistant (Christina)cc:  Patient Care Sarahann Cumins, MD as PCP - General (Internal Medicine)

## 2024-04-16 ENCOUNTER — Encounter
Admit: 2024-04-16 | Payer: PRIVATE HEALTH INSURANCE | Attending: Student in an Organized Health Care Education/Training Program | Primary: Internal Medicine

## 2024-05-01 ENCOUNTER — Encounter: Admit: 2024-05-01 | Payer: PRIVATE HEALTH INSURANCE | Primary: Internal Medicine

## 2024-05-01 ENCOUNTER — Inpatient Hospital Stay: Admit: 2024-05-01 | Discharge: 2024-05-01 | Payer: Medicare (Managed Care) | Primary: Internal Medicine

## 2024-05-01 VITALS — Ht 60.0 in | Wt 111.0 lb

## 2024-05-01 DIAGNOSIS — E785 Hyperlipidemia, unspecified: Secondary | ICD-10-CM

## 2024-05-01 DIAGNOSIS — S72141A Displaced intertrochanteric fracture of right femur, initial encounter for closed fracture: Secondary | ICD-10-CM

## 2024-05-01 DIAGNOSIS — K635 Polyp of colon: Secondary | ICD-10-CM

## 2024-05-01 DIAGNOSIS — Z96641 Presence of right artificial hip joint: Secondary | ICD-10-CM

## 2024-05-01 DIAGNOSIS — K5900 Constipation, unspecified: Principal | ICD-10-CM

## 2024-05-01 DIAGNOSIS — M25551 Pain in right hip: Secondary | ICD-10-CM

## 2024-05-01 DIAGNOSIS — I499 Cardiac arrhythmia, unspecified: Secondary | ICD-10-CM

## 2024-05-01 DIAGNOSIS — F32A Depression: Secondary | ICD-10-CM

## 2024-05-01 NOTE — Progress Notes
 ORTHOPAEDICS & REHABILITATION Division of Adult ReconstructionHip & Knee Arthritis, Total Joint Replacement, and Revision [email or web address] Name:	Jina Olenick Number:	[id number] Date of Birth:	1942-07-23Date of Visit:	05/01/2024  Chief Complaint:  Follow-up of the Right HipHPI:Ms. Lehmkuhl is a 83 y.o. year old female who comes in today for evaluation of right hip revision arthroplasty for fracture with proximal femur ORIF.  Overall doing well, pain improving and ambulatory with minimal assistive device usage.Review of Systems:I have reviewed the following ROS at today's visit:Review of Systems  The patient's intake sheet was also reviewed at the time of evaluation.Past Medical History:Past Medical History[1] Past Surgical History:Past Surgical History[2] Medications:Current Outpatient Medications Medication Sig  acetaminophen  Take 2 tablets (650 mg total) by mouth every 6 (six) hours as needed for pain (mild pain).  bisacodyL  Place 1 suppository (10 mg total) rectally daily as needed for constipation.  buPROPion  XL Take 1 tablet (150 mg total) by mouth daily.  camphor-menthoL  Apply topically as needed for itching. Keep in fridge.  cholecalciferol  (vitamin D3) Take 1 tablet (400 Units total) by mouth daily.  DULoxetine  Take 1 capsule (60 mg total) by mouth daily.  enzymes,digestive (DIGESTIVE ENZYMES ORAL) Take by mouth daily.  gabapentin  Take 2 capsules (200 mg total) by mouth Daily @1800 .  melatonin Take 1 tablet (5 mg total) by mouth nightly.  oxyCODONE  Take 0.5 tablets (2.5 mg total) by mouth every 6 (six) hours as needed (moderate to severe post-op pain).  polyethylene glycol Take 1 packet (17 g total) by mouth daily. Mix in 8 ounces of water, juice, soda, coffee or tea prior to taking.  rosuvastatin  Take 1 tablet (40 mg total) by mouth daily.  senna Take 2 tablets (17.2 mg total) by mouth 2 (two) times daily. No current facility-administered medications for this visit. Allergies:Allergies[3]Social History:Social History Socioeconomic History  Marital status: Married Tobacco Use  Smoking status: Every Day Substance and Sexual Activity  Alcohol use: Not Currently Social History Narrative  03/08/24: Pt lives alone in an apartment with elevator access. PTA pt independent with ADLs, amb with RW, tub shower, chair, son helps (not available 24/7) Has been receiving home services. Pt is a poor historian d/t confusion. Social Drivers of Health Food Insecurity: No Food Insecurity (03/05/2024)  Hunger Vital Sign   Worried About Running Out of Food in the Last Year: Never true   Ran Out of Food in the Last Year: Never true Transportation Needs: No Transportation Needs (03/05/2024)  PRAPARE - Designer, jewellery (Medical): No   Lack of Transportation (Non-Medical): No Housing Stability: Low Risk  (03/05/2024)  Housing Stability   Housing Stability: I have a steady place to live  Occupation: Occupational History  Not on file Physical Exam:Height 5' (1.524 m), weight 50.3 kg.Body mass index is 21.68 kg/m?SABRAGeneral:  No acute distress HEENT: Moist mucous membranes Heart: Regular rate and rhythm Lungs: Symmetric chest rise bilaterallyRight lower extremity: Anterior incision is well healed without erythema or streaking or discharge, tolerates gentle range of motion of the hip without sudden increase in pain, EHL gastrocsoleus tibialis anterior intact.Imaging:  X-rays of the right hip: Revision stem in good position without evidence of loosening, consolidation of the proximal fracture fragments visualized on x-ray and in good position.Formal Radiology Readings for the past week follow below:No results found.Assessment:  83 year old female status post revision total hip arthroplasty with ORIF of the proximal femur.  ICD-10-CM  1. 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  Overall doing very well, we would encourage continued activities as tolerated and follow up in 6 weeks with repeat x-rays of the right hip.  [name], DOY[facility]	[address]	[id number]	[place], [place]  [address] 	[phone] 		Central Office Phone	[phone]		Fax Line	[phone]	 	Assistant (Christina)cc:  Patient Care Dema Powers, MD as PCP - General (Internal Medicine) [1] Past Medical History:Diagnosis Date  Arrhythmia   Colon polyp   Constipation   Depression   Hyperlipidemia  [2] Past Surgical History:Procedure Laterality Date  APPENDECTOMY    BACK SURGERY    COLONOSCOPY  03/11/2018  [facility]  HERNIA REPAIR    HYSTERECTOMY    OOPHORECTOMY Bilateral   PARATHYROIDECTOMY    REPLACEMENT TOTAL HIP W/  RESURFACING IMPLANTS Left   REPLACEMENT TOTAL KNEE BILATERAL    RHINOPLASTY    ROTATOR CUFF REPAIR Right   TONSILLECTOMY   [3] AllergiesAllergen Reactions  Fentanyl  Dizziness, Itching, Other (See Comments), Rash and Unknown   PATCH ONLY.. Oral form is oksweating duragesic  patch only; can tolerate IV Itching.  Duragesic  patch.sweating duragesic  patch only; can tolerate IV Itching.  Duragesic  patch.  PATCH ONLY.. Oral form is ok  Nsaids (Non-Steroidal Anti-Inflammatory Drug) Diarrhea and Other (See Comments)   Abdominal cramping. horrible cramps.  Sodium Pentothal [Thiopental Sodium]    Childhood

## 2024-05-19 ENCOUNTER — Telehealth: Admit: 2024-05-19 | Payer: PRIVATE HEALTH INSURANCE | Attending: Orthopedic Surgery | Primary: Internal Medicine

## 2024-05-19 NOTE — Telephone Encounter
 Copied from CRM #[id number]. Topic: Outbound Reschedule - [facility]>> May 19, 2024  9:59 AM Reena NOVAK wrote:Called [place], Kryssa to reschedule appointment.LVM with pt to clarify her two upcoming appt.Pod: Orthopaedics

## 2024-05-19 NOTE — Telephone Encounter
 Copied from CRM #[id number]. Topic: Outbound Reschedule - [facility]>> May 19, 2024  9:55 AM Reena NOVAK wrote:Called [place], Rubye to reschedule appointment.  Very bad connection with pt. Pt could not clearly hear the times, of the appts that we had rescheduled.  LVM with son, Krystal to call so we could confirm appts with himPod: Orthopaedics

## 2024-06-02 ENCOUNTER — Telehealth: Admit: 2024-06-02 | Payer: PRIVATE HEALTH INSURANCE | Primary: Internal Medicine

## 2024-06-02 NOTE — Telephone Encounter
 Copied from CRM #[id number]. Topic: Scheduling - Schedule Appointment>> Jun 02, 2024 11:51 AM Reena NOVAK wrote:  Pt had Left a message with the Answering Service in re: to an upcoming appt she has.Dr Benjamine 06/19/24, 11:30 amPod:Orthopaedics

## 2024-06-12 ENCOUNTER — Encounter: Admit: 2024-06-12 | Payer: PRIVATE HEALTH INSURANCE | Attending: Orthopedic Surgery | Primary: Internal Medicine

## 2024-06-12 ENCOUNTER — Inpatient Hospital Stay: Admit: 2024-06-12 | Discharge: 2024-06-12 | Payer: Medicare (Managed Care) | Primary: Internal Medicine

## 2024-06-12 ENCOUNTER — Encounter: Admit: 2024-06-12 | Payer: PRIVATE HEALTH INSURANCE | Primary: Internal Medicine

## 2024-06-12 DIAGNOSIS — Z09 Encounter for follow-up examination after completed treatment for conditions other than malignant neoplasm: Secondary | ICD-10-CM

## 2024-06-12 DIAGNOSIS — Z96641 Presence of right artificial hip joint: Principal | ICD-10-CM

## 2024-06-12 DIAGNOSIS — Z96653 Presence of artificial knee joint, bilateral: Secondary | ICD-10-CM

## 2024-06-12 NOTE — Patient Instructions
 ORTHOPAEDICS & REHABILITATIONLee Freddie Breech, M.D.Associate Professor of Orthopaedic SurgeryChief, Division of Hip & Knee Joint ReconstructionHip & Knee Arthritis, Total Joint Replacement & Revision Surgery Amy Haydon-Ryan, PA-CMeghan Arnette Felts, PA-C Donato Schultz, [email or web address]: [phone]  	Fax: [phone]	Dr. Renelda Loma Surgical Booking Assistant:  Dorene Grebe: [phone] (HIPS)(Please allow 3 business days to receive a call before calling the Surgical Scheduler)[facility] Diagnostic Radiology Scheduling:	 [phone]. Rubin's Clinical Office Locations:Tuesday AM: [employer], [address], [place], [place] [id number] AM & PM: [place], [place]: [address] [place], [place]  [address]X[facility] Mailing Address: [address], [place], [place] [address]UYQ Patient - Focused Information about Orthopaedic Surgery Procedures:[email or web address]     PLEASE sign up for Epic MyChart if you have not already done so. This is a critical step that allows [place] to communicate with you and send electronic prescriptions. You will also be able to view notes, lab / test results, and send secure messages to your clinical care team. You can complete the sign-up process in 3 three easy steps:1) Visit the [facility] MyChart website at [email or web address] and click on the ?Sign Up Now? button. 2) At the next screen, select ?Request Access Code? and complete the enrollment screens.3) Complete the [employer] identify verification process.For COVID TESTING AND VACCINE INFORMATION:[phone])

## 2024-06-14 NOTE — Progress Notes
 Department of Orthopaedics & Rehabilitation  Division of Hip & Knee Joint [email or web address] Name:	Holly Erickson Number:	[id number] Date of Birth:	10-01-1942Date of Visit:	06/12/2024  Holly Erickson is currently 83 y.o.She was last seen on 10/23/2023, Dr. Melodye, 12 weeks postopShe is status post:07/30/23 right total hip replacement for femoral neck fracture, Dr. Rubin10/7/24 closed reduction of posterior dislocation of right prosthetic hip   Review of Systems:I have reviewed the ROS, recorded by  the medical assistant at today's visit   Interval History: Holly Erickson is a 83 y.o. female with a past  history of a  R THA on 07/30/2023. She had a R THA revision on 03/07/2024 with Dr. Benjamine after experiencing a periprosthetic hip fracture after falling on her right side on 03/05/2024. She returns today for further evaluation of her right hip 10 months and 2 weeks postop R THA,  and new onset of right knee pain. She reports the right hip is doing well overall, although it hurts at times.  She has an appointment next week for follow-up on her right hip with Dr. Benjamine. She reports when the right knee pain first started, she had difficulty getting up from a sitting position, she reports it has gotten better. She reports that she gets an occasional electric shock to outside of the right knee that stays there, and at times her right knee gives out. She states that she is experiencing sleep disturbances,and she was awake all night last night with L knee pain, not R knee pain. She takes Tylenol  as needed for pain relief. Patient Examined With:Cole Bridges DO PMR PGY-1Physical Exam: AAOX3, Alone for the visit today. Able to stand and walk in the exam room with an alternating stride and using a Rolator as an assistive device. Right hip ROM is from 90 to 120 of flexion, and at 90 the IR is 20 and ER is 70. No pain with passive FADIR or FABER.  Patient is able to reach the figure 4 with an active assisted motion but has mild stiffness while doing so.  Leg lengths are symmetric while seated, and the limb is intact to LT and Motor function distally.  Incision healing well with ongoing cosmetic remodeling.Left knee ROM is from 0 to 125 of flexion, by goniometer, with neutral alignment and reasonable collateral balance with firm endpoints. The limb is intact to LT and Motor function distally.  Incision healing well with ongoing remodeling.Right knee ROM is from 0 to 120 of flexion, by goniometer, with neutral alignment and reasonable collateral balance with firm endpoints. The limb is intact to LT and Motor function distally.  Incision healing well with ongoing remodeling.Imaging:  XR Bilateral Knee 06/12/2024:XR Right Hip 05/01/2024:Formal Radiology Reading follows below:XR Knee Bilateral AP Lateral and AxialResult Date: 8/8/2025Study: XR KNEE BILATERAL AP LATERAL AND AXIAL ([facility]).  Indication: follow up Prior: Right femur radiographs dated March 05, 2024. Findings: Bilateral knee arthroplasty hardware is present. There is no hardware complication or failure. No joint effusions are seen. Impression: Bilateral knee arthroplasties with intact hardware. [facility] Radiology Notify System:  Routine. Reported and signed by: Prentice Donald, MD  [facility] Radiology and Biomedical Imaging XR Hip Right AP and Lateral 05/01/2024:INDICATION: Right hip pain COMPARISON: 03/05/2024 FINDINGS: There has been revision of the total hip arthroplasty with placement of a longstem femoral component and a cerclage wire in the intertrochanteric region. Again noted is an intertrochanteric fracture of the femur. The lesser trochanteric fragment is again displaced medially. There is some early healing at the fracture margins.  A left total hip arthroplasty appears unremarkable on the AP pelvis view. Impression:Revision of right total hip arthroplasty and healing of intertrochanteric fracture. Reported and signed by: Prentice Canny, MD  [facility] Radiology and Biomedical Imaging Diagnosis Coding:   ICD-10-CM  1. S/P total right hip arthroplasty  Z96.641 XR Hips Bilateral AP and Lateral with AP Pelvis   CANCELED: XR Knee Bilateral AP Lateral Axial and Tunnel ([facility])  Assessment: With respect to the right hip,  the implants are well aligned and well seated. The intertrochanteric fracture is healing.  She had plans follow up with my partner as she just had surgery with him in May.  I expressed my gratitude for his care of the patient for this unexpected problem, as she seems to be healing well in his feeling good.With respect to the knees, she has historic total knee arthroplasties which both appeared to be situated appropriately without evidence of migration, loosening, or catastrophic failure. After discussion with the patient, I recommend  that the patient try a Lidocaine  patch on the knees as needed for pain relief.  She may also be having some pain from her lumbar spine where there may be some degenerative joint disease.The patient will discuss her right knee with Dr. Benjamine for a 2nd opinion at the follow-up appointment next week. There is no role for further surgery at this time.Further Workup / Advanced Imaging Plan: None today. Treatment / Intervention Plan:None today.Follow-Up Plan:The patient will follow-up in out office or with Dr. Benjamine PRN.The patient may contact me via MyChart with any questions in the interim.Jama CHARLENA Rao, M.D., BURLEY MARTINEZ, FAOABoard Certified Diplomate of the American Board of Orthopaedic SurgeryAssociate Professor of Orthopaedic Surgery, [facility]	Chief, [place] Hip & Knee Joint Reconstruction ServiceAttending Orthopaedic Surgeon, [facility]	Chief, [facility] Total Joint Replacement ProgramProgram Director, Psychologist, sport and exercise Orthopaedics and Rehabilitation	[address]	[place]	[facility] [place], [place]  [address] 	[phone] 		Main Office & Appointments	[phone]		Fax Line	[phone])	Assistant Direct LineBilling & Compliance: On the day of this patient's encounter, a total of 15 minutes was personally spent by me.  This time included preparation with record and imaging review, obtaining and reviewing patient history, performing a medically appropriate examination, counseling the patient, family, and/or caregiver, ordering medications, tests, or procedures, referring to and communicating with other healthcare professionals, and documenting all of the above clinical information into the medical record. This does not include any resident/fellow teaching time, or any time spent performing a procedural service. Virtual Scribe Attestation(s):Scribed for Rao Jama BRAVO, MD by Izetta Roosevelt, medical scribe August 9, 2025The documentation recorded by the scribe accurately reflects the services I personally performed and the decisions made by me. I reviewed and confirmed all material entered and/or pre-charted by the scribe.cc:  Patient Care Dema Powers, MD as PCP - General (Internal Medicine)

## 2024-06-14 NOTE — Progress Notes
 Review of Systems Musculoskeletal:       Follow up for right hip with new onset pain of right knee pain.  She reports the hip is doing well overall. She reports when the first knee pain started, she had difficulty getting up from a sitting position, she reports it has gotten better. She reports she gets an occasional electric shock to the knee and at times it gives out. She takes tylenol  as needed  All other systems reviewed and are negative.

## 2024-06-19 ENCOUNTER — Encounter: Admit: 2024-06-19 | Payer: PRIVATE HEALTH INSURANCE | Primary: Internal Medicine

## 2024-06-19 ENCOUNTER — Inpatient Hospital Stay: Admit: 2024-06-19 | Discharge: 2024-06-19 | Payer: Medicare (Managed Care) | Primary: Internal Medicine

## 2024-06-19 DIAGNOSIS — E785 Hyperlipidemia, unspecified: Secondary | ICD-10-CM

## 2024-06-19 DIAGNOSIS — Z96641 Presence of right artificial hip joint: Principal | ICD-10-CM

## 2024-06-19 DIAGNOSIS — Z471 Aftercare following joint replacement surgery: Secondary | ICD-10-CM

## 2024-06-19 DIAGNOSIS — I499 Cardiac arrhythmia, unspecified: Secondary | ICD-10-CM

## 2024-06-19 DIAGNOSIS — F32A Depression: Secondary | ICD-10-CM

## 2024-06-19 DIAGNOSIS — K635 Polyp of colon: Secondary | ICD-10-CM

## 2024-06-19 DIAGNOSIS — K5900 Constipation, unspecified: Principal | ICD-10-CM

## 2024-06-19 MED ORDER — DICLOFENAC 1 % TOPICAL GEL
1 | Freq: Four times a day (QID) | TOPICAL | 3 refills | 42.50000 days | Status: AC
Start: 2024-06-19 — End: ?

## 2024-06-22 NOTE — Progress Notes
 ORTHOPAEDICS & REHABILITATION Division of Adult ReconstructionHip & Knee Arthritis, Total Joint Replacement, and Revision [email or web address] Name:	Holly Erickson Number:	[id number] Date of Birth:	12/04/1942Date of Visit:	06/19/2024  Chief Complaint:  Follow-up of the Right HipHPI:Ms. Burek is a 83 y.o. year old female who comes in today for evaluation of right hip status post revision total hip arthroplasty for periprosthetic fracture.  She is also having some right knee pain that she describes as electric shock type sensations and seems to be responding well to topical anti-inflammatories.  She seems to be progressing well with the right hip has no current issues or concerns and is interested in continue with the topical anti-inflammatories for the right knee.Review of Systems:I have reviewed the following ROS at today's visit:Review of Systems  The patient's intake sheet was also reviewed at the time of evaluation.Past Medical History:Past Medical History[1] Past Surgical History:Past Surgical History[2] Medications:Current Outpatient Medications Medication Sig  acetaminophen  Take 2 tablets (650 mg total) by mouth every 6 (six) hours as needed for pain (mild pain).  bisacodyL  Place 1 suppository (10 mg total) rectally daily as needed for constipation.  buPROPion  XL Take 1 tablet (150 mg total) by mouth daily.  camphor-menthoL  Apply topically as needed for itching. Keep in fridge.  cholecalciferol  (vitamin D3) Take 1 tablet (400 Units total) by mouth daily.  diclofenac  Apply topically 4 (four) times daily.  DULoxetine  Take 1 capsule (60 mg total) by mouth daily.  enzymes,digestive (DIGESTIVE ENZYMES ORAL) Take by mouth daily.  gabapentin  Take 2 capsules (200 mg total) by mouth Daily @1800 .  melatonin Take 1 tablet (5 mg total) by mouth nightly.  oxyCODONE  Take 0.5 tablets (2.5 mg total) by mouth every 6 (six) hours as needed (moderate to severe post-op pain).  polyethylene glycol Take 1 packet (17 g total) by mouth daily. Mix in 8 ounces of water, juice, soda, coffee or tea prior to taking.  rosuvastatin  Take 1 tablet (40 mg total) by mouth daily.  senna Take 2 tablets (17.2 mg total) by mouth 2 (two) times daily. No current facility-administered medications for this visit. Allergies:Allergies[3]Social History:Social History Socioeconomic History  Marital status: Married Tobacco Use  Smoking status: Every Day Substance and Sexual Activity  Alcohol use: Not Currently Social History Narrative  03/08/24: Pt lives alone in an apartment with elevator access. PTA pt independent with ADLs, amb with RW, tub shower, chair, son helps (not available 24/7) Has been receiving home services. Pt is a poor historian d/t confusion. Social Drivers of Health Food Insecurity: No Food Insecurity (03/05/2024)  Hunger Vital Sign   Worried About Running Out of Food in the Last Year: Never true   Ran Out of Food in the Last Year: Never true Transportation Needs: No Transportation Needs (03/05/2024)  PRAPARE - Designer, jewellery (Medical): No   Lack of Transportation (Non-Medical): No Housing Stability: Low Risk  (03/05/2024)  Housing Stability   Housing Stability: I have a steady place to live  Occupation: Occupational History  Not on file Physical Exam:There were no vitals taken for this visit.There is no height or weight on file to calculate BMI.General:  No acute distress HEENT: Moist mucous membranes Heart: Regular rate and rhythm Lungs: Symmetric chest rise bilaterallyRight hip: Incision is well healing without erythema or streaking or discharge, no sudden increase in pain with gentle range of motion about the hip.Right knee:  Patient has 0-120 degrees range of motion stable throughout arc of motion, no significant effusion present, no hypersensitivity or skin soft tissue changes about the  knee.Imaging:  Formal Radiology Readings for the past week follow below:XR Hip Right AP and LateralResult Date: 8/14/2025XR HIP RIGHT AP AND LATERAL Clinical Indication: S/P RTHA. Comparison: 05/01/2024 Findings: Redemonstration of right total hip arthroplasty with cerclage wire fixation similar to prior. No evidence for hardware loosening or other complication. Redemonstration of intertrochanteric fracture with similar medially displaced lesser trochanteric fracture fragment. Similar appearing left total hip arthroplasty. Degenerative changes of the visualized lower lumbar spine, SI joints and symphysis pubis. Sacrum obscured by bowel gas limiting its evaluation. Impression: Similar appearing right total hip arthroplasty and healing intertrochanteric fracture.  Radiology Notify System Classification: Routine. Reported and signed by: Marciana Abide, MD  [facility] Radiology and Biomedical Imaging Assessment:  83 year old female status post revision total hip arthroplasty on the right side doing well.  New onset right knee pain.  ICD-10-CM  1. S/P total right hip arthroplasty  Z96.641 XR Hip Right AP and Lateral  Plan:The history, examination, and radiographic findings were reviewed in detail with the patient today, and the diagnostic and therapeutic management options were discussed in detail.  Overall doing very well for the revision surgery that she had on her right hip, we would continue with activities as tolerated and follow up at a year from surgery with repeat x-rays.  In terms of her right knee pain I do not see any mechanical issues both on exam or on x-ray and I think it is reasonable to continue with the topical anti-inflammatories long as this is helping.  [name], DOY[facility]	[address]	[id number]	[place], [place] [address] 	[phone] 		Central Office Phone	[phone]		Fax Line	[phone]	 	Assistant (Christina)cc:  Patient Care Dema Powers, MD as PCP - General (Internal Medicine) [1] Past Medical History:Diagnosis Date  Arrhythmia   Colon polyp   Constipation   Depression   Hyperlipidemia  [2] Past Surgical History:Procedure Laterality Date  APPENDECTOMY    BACK SURGERY    COLONOSCOPY  03/11/2018  [facility]  HERNIA REPAIR    HYSTERECTOMY    OOPHORECTOMY Bilateral   PARATHYROIDECTOMY    REPLACEMENT TOTAL HIP W/  RESURFACING IMPLANTS Left   REPLACEMENT TOTAL KNEE BILATERAL    RHINOPLASTY    ROTATOR CUFF REPAIR Right   TONSILLECTOMY   [3] AllergiesAllergen Reactions  Fentanyl  Dizziness, Itching, Other (See Comments), Rash and Unknown   PATCH ONLY.. Oral form is oksweating duragesic  patch only; can tolerate IV Itching.  Duragesic  patch.sweating duragesic  patch only; can tolerate IV Itching.  Duragesic  patch.  PATCH ONLY.. Oral form is ok  Nsaids (Non-Steroidal Anti-Inflammatory Drug) Diarrhea and Other (See Comments)   Abdominal cramping. horrible cramps.  Sodium Pentothal [Thiopental Sodium]    Childhood

## 2024-06-23 ENCOUNTER — Telehealth: Admit: 2024-06-23 | Payer: PRIVATE HEALTH INSURANCE | Primary: Internal Medicine

## 2024-06-23 ENCOUNTER — Encounter: Admit: 2024-06-23 | Payer: PRIVATE HEALTH INSURANCE | Primary: Internal Medicine

## 2024-06-23 DIAGNOSIS — Z96651 Presence of right artificial knee joint: Principal | ICD-10-CM

## 2024-06-23 MED ORDER — LIDOCAINE 4 % TOPICAL PATCH
4 | MEDICATED_PATCH | TOPICAL | 2 refills | 30.00000 days | Status: AC
Start: 2024-06-23 — End: 2024-06-30

## 2024-06-23 NOTE — Telephone Encounter
 Spoke with patient. She is asking for an order for lidocaine  patches. She had some from a family member and has been alternating between the patch and the diclofenac  gel and says together she is getting enough relief to sleep at night. She would like order for lidocaine  patches. She is aware that PA is needed and insurance may not cover them.

## 2024-06-23 NOTE — Telephone Encounter
 Copied from CRM #[id number]. Topic: General Message - [facility]>> Jun 23, 2024 11:44 AM Asberry BROCKS wrote:[facility] MESSAGETime of call:   11:44 AMCaller:   NikkiCaller's relationship to patient:  n/a  Calling from [employer], hospital, agency, etc.):  [employer]   Specialist you are calling for:  Dr Berenice for call:   Levon called on behalf of the patient asking for an update on the pt's Lidocaine  patch 5% which will also need to a PA.  Please call patient back to let her know the status.  Thank youIf not feeling well, what are symptoms:  n/a   If having symptoms, how long have the symptoms been present:  n/a   Does caller request to speak to someone urgently?  no   Best telephone number for callback:   [phone] Best time to return call:   anytime Permission to leave message:  yes   [facility] Referral Specialist

## 2024-06-24 ENCOUNTER — Telehealth: Admit: 2024-06-24 | Payer: PRIVATE HEALTH INSURANCE | Primary: Internal Medicine

## 2024-06-24 NOTE — Telephone Encounter
 Copied from CRM [id number]. Topic: General Message - [facility]>> Jun 24, 2024  2:41 PM Ike ORN wrote:[facility] CENTER MESSAGETime of call:   2:41 PMCaller:   Rawl - HumanaCaller's relationship to patient:    Calling from [employer], hospital, agency, etc.):     Specialist you are calling for:  Benjamine Reason for call:   Prior Auth for Lidocaine  needs more information.  Please call  If not feeling well, what are symptoms:     If having symptoms, how long have the symptoms been present:     Does caller request to speak to someone urgently?     Best telephone number for callback:   [phone]  - reference # [id number] Best time to return call:    Permission to leave message:     Ike PARAS [facility] Referral Specialist

## 2024-06-24 NOTE — Telephone Encounter
 Returned call to [facility], All questions for Lidocaine  PA answered.

## 2024-06-30 ENCOUNTER — Encounter: Admit: 2024-06-30 | Payer: PRIVATE HEALTH INSURANCE | Attending: Emergency Medicine | Primary: Internal Medicine

## 2024-06-30 DIAGNOSIS — Z96651 Presence of right artificial knee joint: Principal | ICD-10-CM

## 2024-06-30 MED ORDER — DICLOFENAC 1 % TOPICAL GEL
1 | Freq: Four times a day (QID) | TOPICAL | 3 refills | 25.00000 days | Status: AC
Start: 2024-06-30 — End: ?

## 2024-06-30 MED ORDER — LIDOCAINE 4 % TOPICAL PATCH
4 | MEDICATED_PATCH | TOPICAL | 2 refills | 30.00000 days | Status: AC
Start: 2024-06-30 — End: ?

## 2024-08-05 ENCOUNTER — Telehealth: Admit: 2024-08-05 | Payer: PRIVATE HEALTH INSURANCE | Primary: Internal Medicine

## 2024-08-05 ENCOUNTER — Encounter: Admit: 2024-08-05 | Payer: PRIVATE HEALTH INSURANCE | Attending: Orthopedic Surgery | Primary: Internal Medicine

## 2024-08-05 NOTE — Telephone Encounter
 Copied from CRM #[id number]. Topic: General Message - [facility]>> Aug 05, 2024 10:57 AM Mickel HERO wrote:[facility] CENTER MESSAGETime of call:   10:57 AMCaller:   Houdeshell, LYNNSpecialist you are calling for:  Dr Berenice for call:   Patient recently updated her phone:  [phone] telephone number for callback:   [phone]

## 2024-08-05 NOTE — Telephone Encounter
 Copied from CRM #[id number]. Topic: General Message - [facility]>> Aug 05, 2024 10:29 AM Benton RAMAN wrote:,[facility] CARE CENTER MESSAGETime of call:   10:30 AMCaller:   patientReason for call:   Patient had surgery with Dr. Benjamine on 03/07/24. Patient states she is in a lot of pain and unable to get out of bed. If not feeling well, what are symptoms:  Right hip pain. At times the pain goes down to right ankle.    If having symptoms, how long have the symptoms been present:  Patient states she was doing well until about a month ago and the pain started. Patient states today she is in so much pain she screamed when trying to move. Patient requesting a return call to discuss.    Does caller request to speak to someone urgently?  As soon as possible Provider:  Dr. Benjamine Last clinic visit date:  8/14/25Best telephone number for callback:   [phone] Best time to return call (encourage patient to be available for callback):   Anytime Permission to leave message:  yes   [facility] Referral Specialist

## 2024-08-05 NOTE — Telephone Encounter
 Spoke to [employer]. She was calling back to accept appointment on 10/2 at 3:45. Scheduled appointment.

## 2024-08-05 NOTE — Telephone Encounter
 Patient calling in states she is looking to return a call to a Olam who is a nurse waiting for her to callback. Bcb [phone]   I updated her new tele number

## 2024-08-05 NOTE — Telephone Encounter
 Spoke with [employer]. She is experiencing constant right hip pain. Sometimes radiates down the leg to the ankle. She said it takes over 30 minutes to get out of bed due to pain. Ambulates with walker - is able to put weight on right leg. Pain is 5/10 sometimes when walking. Patient is comfortable in seated position. She denies fall or injury, no visible deformity, no redness, no swelling. Denies fever or chills. Takes tylenol  as needed and applies ice. Pain is limiting activity. Patient is asking for sooner appointment. Offered 10/2 either urgent opening. Patient is checking about a ride and will call the office back.

## 2024-08-05 NOTE — Telephone Encounter
 Retunred call to number provided.  NO VM set up.

## 2024-08-07 ENCOUNTER — Encounter: Admit: 2024-08-07 | Payer: Medicare (Managed Care) | Primary: Internal Medicine

## 2024-08-08 ENCOUNTER — Ambulatory Visit: Admit: 2024-08-08 | Payer: Medicare (Managed Care) | Primary: Internal Medicine

## 2024-08-08 ENCOUNTER — Inpatient Hospital Stay: Admit: 2024-08-08 | Discharge: 2024-08-08 | Payer: Medicare (Managed Care) | Primary: Internal Medicine

## 2024-08-08 ENCOUNTER — Encounter: Admit: 2024-08-08 | Payer: Medicare (Managed Care) | Attending: Medical | Primary: Internal Medicine

## 2024-08-08 ENCOUNTER — Encounter: Admit: 2024-08-08 | Payer: PRIVATE HEALTH INSURANCE | Attending: Medical | Primary: Internal Medicine

## 2024-08-08 DIAGNOSIS — K5900 Constipation, unspecified: Principal | ICD-10-CM

## 2024-08-08 DIAGNOSIS — K635 Polyp of colon: Secondary | ICD-10-CM

## 2024-08-08 DIAGNOSIS — M25551 Pain in right hip: Secondary | ICD-10-CM

## 2024-08-08 DIAGNOSIS — Z96641 Presence of right artificial hip joint: Principal | ICD-10-CM

## 2024-08-08 DIAGNOSIS — F32A Depression: Secondary | ICD-10-CM

## 2024-08-08 DIAGNOSIS — E785 Hyperlipidemia, unspecified: Secondary | ICD-10-CM

## 2024-08-08 DIAGNOSIS — Z471 Aftercare following joint replacement surgery: Secondary | ICD-10-CM

## 2024-08-08 DIAGNOSIS — Z96643 Presence of artificial hip joint, bilateral: Secondary | ICD-10-CM

## 2024-08-08 DIAGNOSIS — I499 Cardiac arrhythmia, unspecified: Secondary | ICD-10-CM

## 2024-08-11 ENCOUNTER — Telehealth: Admit: 2024-08-11 | Payer: PRIVATE HEALTH INSURANCE | Attending: Medical | Primary: Internal Medicine

## 2024-08-11 NOTE — Telephone Encounter
 Copied from CRM #[id number]. Topic: [facility] General CRM - General Inquiry>> Aug 11, 2024  1:34 PM Artur Winningham F wrote:Curbow, Shefali called regarding PT Auth.Demaya is calling to follow up on the call she made earlier. She would like to know about the status about the prior auth for her physical therapy.

## 2024-08-11 NOTE — Telephone Encounter
 Copied from CRM #[id number]. Topic: General Message - [facility]>> Aug 11, 2024 12:34 PM Suhani Stillion J wrote:[facility] CENTER MESSAGETime of call:   12:34 PMCaller:   Daniello, LYNNCaller's relationship to patient:  n/a  Calling from [employer], hospital, agency, etc.):  n/a   Specialist you are calling for:  Nicole LayReason for call:   Patient said she needs Prior Auth for PTIf not feeling well, what are symptoms:  n/a   If having symptoms, how long have the symptoms been present:  n/a   Does caller request to speak to someone urgently?  no   Best telephone number for callback:   [phone] Best time to return call:   AnytimePermission to leave message:  yes   Martyna Thorns [facility]

## 2024-08-11 NOTE — Telephone Encounter
 Returned call all questions answered.

## 2024-08-12 ENCOUNTER — Telehealth: Admit: 2024-08-12 | Payer: PRIVATE HEALTH INSURANCE | Primary: Internal Medicine

## 2024-08-12 NOTE — Telephone Encounter
 Holly Erickson is pending

## 2024-08-12 NOTE — Telephone Encounter
 Patient called to check status of PT auth-advised it is pending-meaning submitted and awaiting response from the insurance.  She asked if someone could call her once it is authorized, please.  [phone].  Thank you.

## 2024-08-12 NOTE — Progress Notes
 ORTHOPAEDICS & REHABILITATION Division of Adult ReconstructionHip & Knee ArthritisTotal Joint Replacement & Revision [email or web address] Name:	Holly Erickson Number:	[id number] Date of Birth:	1942-06-22Date of Visit:	08/08/2024  Holly Erickson is currently 83 y.o.She was last seen on Visit date not foundShe is status post:07/30/23 right total hip replacement for femoral neck fracture, Dr. Rubin10/7/24 closed reduction of posterior dislocation of right prosthetic hip   Status post right total hip revision and ORIF proximal femur on Mar 07, 2024, Dr. Fortino of Systems:I have reviewed the ROS, recorded by  the medical assistant at today's visit  Interval History:  Patient is an 83 year old female status post above procedure in May who presents the office today for follow up due to increasing right hip pain in the absence of any injury or trauma since August.  Patient states that she now has right buttock pain radiating down her right leg to her right ankle intermittently.  She presents with her neighbor today who drove her.  Patient is not using any assistive devices.  Denies any fevers chills or systemic symptoms.  Denies any groin pain.Patient Examined With:Patient's neighbor was present Physical Exam:  Right hip seated hip flexion 90-95 degrees.  Internal rotation 20? external rotation 60?Holly Erickson  Adduction to midline abduction 70?Holly Erickson  Patient is able to walk with an alternating stride with a nonantalgic gait.  Negative straight leg raise.  Right calf is soft.  Sensory motor neurovascular intact right lower extremity.Imaging:  Formal Radiology Reading follows below:Study: XR HIP RIGHT AP AND LATERAL. Indication: hip pain Prior: June 19, 2024 Findings:Bilateral hip arthroplasty hardware is present and appears unchanged. There is no hardware complication or failure. Impression:Bilateral hip arthroplasty with intact hardware. Reported and signed by: Prentice Donald, MD  [facility] Division Radiology and Biomedical Imaging Diagnosis:   ICD-10-CM  1. S/P total right hip arthroplasty  Z96.641 Ambulatory referral to Rehab Services  Further Workup / Advanced Imaging Plan: Deferred at this time Treatment / Intervention Plan:Orders entered for formal physical therapy for right hip and lumbar spine stretching strengthening range of motion.- Modify activities below threshold of discomfort & avoid particular triggers of pain when possible. -Choose non impact exercises like stationary bike, water exercising, and chair based routines.-During flare-ups utilize resting, elevation, cryotherapy/heating pad, and compression if helpful.-Utilize cane or walker to unload painful joints and for better stability/safety when needed.-Proper body weight, leg strength maintenance, and supportive shoes for standing/ambulating.-OTC meds like Tylenol  for discomfort relief and NSAIDS for inflammation (if able).-Topical preparations such as Diclofenac  gel and Lidocaine  patches and creams as helpful.- Discussed with the patient that at this time it appears to be more of a lumbar radiculopathy causing her symptomology and may need a referral to the spine service for further evaluation if symptoms persist Follow-Up Plan:Patient will evaluate response to the physical therapy for her right hip and lumbar spine and follow up in 6-8 weeks with Dr. Mikey [facility]ERRITT [id number] [place], [place] 	[phone] 		Main Office & Appointments	[phone]		Fax Line	[phone])	Assistant Direct LineBilling & Compliance: 20 minutes were spent with the patient, and greater than 50% was spent in face to face evaluation and examination. The remainder of this was spent in review of records, review of imaging and lab studies, medical decision making, and coordination of care.Modal Dictationcc:  Patient Care Holly Powers, MD as PCP - General (Internal Medicine)

## 2024-08-13 NOTE — Telephone Encounter
 Returned call to [employer]. Scheduling notes state patient chose to delay care. Confirmed [facility] location for PT, advised patient to schedule so that shara can be done.

## 2024-08-13 NOTE — Telephone Encounter
 Patient is calling back again to check on authorization, please call her for update [phone]

## 2024-08-14 ENCOUNTER — Telehealth: Admit: 2024-08-14 | Payer: PRIVATE HEALTH INSURANCE | Primary: Internal Medicine

## 2024-08-14 ENCOUNTER — Encounter: Admit: 2024-08-14 | Payer: PRIVATE HEALTH INSURANCE | Primary: Internal Medicine

## 2024-08-14 NOTE — Telephone Encounter
 Copied from CRM #[id number]. Topic: [facility] General CRM - General Inquiry>> Aug 14, 2024  1:30 PM Mitzie SAUNDERS wrote:Erickson, Holly called regarding she is in significant pain and cannot get in to PT until Dec 11 th-wants to know if there is anything she can do or take to help with pain. She has been taking Tylenol  but it is not helping.  She does not want oxycodone  or anything like that.  She can be reached at [phone]. Thank you.

## 2024-08-14 NOTE — Telephone Encounter
 Spoke to patient she continues to have been in her right hip.  She is also c/o back pain.  She has had spine surgery in the past.  She will reach out to the spine center for an appointment.  In the meantime she will try using Voltaren  gel and lidocaine  patches.

## 2024-08-15 ENCOUNTER — Encounter: Admit: 2024-08-15 | Payer: PRIVATE HEALTH INSURANCE | Attending: Emergency Medicine | Primary: Internal Medicine

## 2024-08-15 ENCOUNTER — Telehealth: Admit: 2024-08-15 | Payer: PRIVATE HEALTH INSURANCE | Primary: Internal Medicine

## 2024-08-15 DIAGNOSIS — M5416 Radiculopathy, lumbar region: Principal | ICD-10-CM

## 2024-08-15 NOTE — Telephone Encounter
 Copied from CRM #[id number]. Topic: General Message - [facility]>> Aug 15, 2024  3:37 PM Micheal Sheen F wrote:[facility] CENTER MESSAGETime of call:   3:37 PMCaller:   Jean, LYNNCaller's relationship to patient:  self  Calling from (pharmacy, hospital, agency, etc.):  n/a   Specialist you are calling for:  LeiningerReason for call:   Patient request call back from nurse patient stats she has questions regarding  medication Gabapentin  and possibly starting to take medication again . Please call. If not feeling well, what are symptoms:  n/a   If having symptoms, how long have the symptoms been present:  n/a   Does caller request to speak to someone urgently?  no   Best telephone number for callback:   [phone] Best time to return call:   anytime Permission to leave message:  yes   [facility] Referral Specialist

## 2024-08-15 NOTE — Telephone Encounter
 Error

## 2024-08-15 NOTE — Telephone Encounter
 Spoke to patient she was last seen on 10/3 for her right hip.  Patient continue to have pain extending to ankle.  She is asking for a referral to be placed to the spine center as discussed at her appt.

## 2024-09-11 ENCOUNTER — Encounter: Admit: 2024-09-11 | Payer: PRIVATE HEALTH INSURANCE

## 2024-09-12 ENCOUNTER — Encounter: Admit: 2024-09-12 | Payer: PRIVATE HEALTH INSURANCE

## 2024-09-12 DIAGNOSIS — R634 Abnormal weight loss: Secondary | ICD-10-CM

## 2024-09-12 DIAGNOSIS — F17200 Nicotine dependence, unspecified, uncomplicated: Principal | ICD-10-CM

## 2024-09-15 ENCOUNTER — Encounter: Admit: 2024-09-15 | Payer: PRIVATE HEALTH INSURANCE | Attending: Geriatric Medicine

## 2024-09-25 ENCOUNTER — Inpatient Hospital Stay: Admit: 2024-09-25 | Discharge: 2024-09-25 | Payer: PRIVATE HEALTH INSURANCE

## 2024-09-25 ENCOUNTER — Encounter: Admit: 2024-09-25 | Payer: PRIVATE HEALTH INSURANCE

## 2024-09-25 VITALS — Ht 60.0 in | Wt 93.0 lb

## 2024-09-25 DIAGNOSIS — M25552 Pain in left hip: Secondary | ICD-10-CM

## 2024-09-25 DIAGNOSIS — Z96641 Presence of right artificial hip joint: Principal | ICD-10-CM

## 2024-09-25 DIAGNOSIS — Z96643 Presence of artificial hip joint, bilateral: Secondary | ICD-10-CM

## 2024-09-25 DIAGNOSIS — M25551 Pain in right hip: Secondary | ICD-10-CM

## 2024-09-25 NOTE — Progress Notes [1]
 Review of Systems Musculoskeletal:       Follow-up: Right Hip 6 wk f/uPain: 4/10

## 2024-09-25 NOTE — Progress Notes [1]
 ORTHOPAEDICS & REHABILITATION Division of Adult ReconstructionHip & Knee Arthritis, Total Joint Replacement, and Revision [email or web address] Name:	Holly Erickson Number:	[id number] Date of Birth:	May 06, 1942Date of Visit:	09/25/2024  Chief Complaint:  Follow-up of the Right HipHPI:Ms. Holly Erickson is a 83 y.o. year old female who comes in today for postoperative check of the right hip she is 6 weeks out from surgery for a revision of a periprosthetic fracture.  Overall she is doing well has no pain with ambulation but does have pain with initiation of motion and getting off of her bed.  No fevers chills or flu-like symptoms.Review of Systems:I have reviewed the following ROS at today's visit:Review of Systems  The patient's intake sheet was also reviewed at the time of evaluation.Past Medical History:Past Medical History[1] Past Surgical History:Past Surgical History[2] Medications:Current Outpatient Medications Medication Sig  acetaminophen  Take 2 tablets (650 mg total) by mouth every 6 (six) hours as needed for pain (mild pain).  camphor-menthoL  Apply topically as needed for itching. Keep in fridge.  cholecalciferol  (vitamin D3) Take 1 tablet (400 Units total) by mouth daily.  diclofenac  Apply topically 4 (four) times daily.  DULoxetine  Take 1 capsule (60 mg total) by mouth daily.  lidocaine  Place 1 patch over 12 hours onto the right knee every 24 hours. Remove & Discard patch within 12 hour.  melatonin Take 1 tablet (5 mg total) by mouth nightly.  bisacodyL  Place 1 suppository (10 mg total) rectally daily as needed for constipation. (Patient not taking: Reported on 09/25/2024)  buPROPion  XL Take 1 tablet (150 mg total) by mouth daily. (Patient not taking: Reported on 09/25/2024)  enzymes,digestive (DIGESTIVE ENZYMES ORAL) Take by mouth daily. (Patient not taking: Reported on 09/25/2024)  gabapentin  Take 2 capsules (200 mg total) by mouth Daily @1800 . (Patient not taking: Reported on 09/25/2024)  oxyCODONE  Take 0.5 tablets (2.5 mg total) by mouth every 6 (six) hours as needed (moderate to severe post-op pain). (Patient not taking: Reported on 09/25/2024)  polyethylene glycol Take 1 packet (17 g total) by mouth daily. Mix in 8 ounces of water, juice, soda, coffee or tea prior to taking. (Patient not taking: Reported on 09/25/2024)  rosuvastatin  Take 1 tablet (40 mg total) by mouth daily. (Patient not taking: Reported on 09/25/2024)  senna Take 2 tablets (17.2 mg total) by mouth 2 (two) times daily. (Patient not taking: Reported on 09/25/2024) No current facility-administered medications for this visit. Allergies:Allergies[3]Social History:Social History Socioeconomic History  Marital status: Married Tobacco Use  Smoking status: Every Day Substance and Sexual Activity  Alcohol use: Not Currently Social History Narrative  03/08/24: Pt lives alone in an apartment with elevator access. PTA pt independent with ADLs, amb with RW, tub shower, chair, son helps (not available 24/7) Has been receiving home services. Pt is a poor historian d/t confusion. Social Drivers of Health Food Insecurity: No Food Insecurity (03/05/2024)  Hunger Vital Sign   Worried About Running Out of Food in the Last Year: Never true   Ran Out of Food in the Last Year: Never true Transportation Needs: No Transportation Needs (03/05/2024)  PRAPARE - Designer, Jewellery (Medical): No   Lack of Transportation (Non-Medical): No Housing Stability: Low Risk (03/05/2024)  Housing Stability   Housing Stability: I have a steady place to live  Occupation: Occupational History  Not on file Physical Exam:Height 5' (1.524 m), weight 42.2 kg.Body mass index is 18.16 kg/m?SABRAGeneral:  No acute distress HEENT: Moist mucous membranes Heart: Regular rate and rhythm Lungs: Symmetric chest rise bilaterallyRight lower extremity:  Incision is  well healed without erythema or streaking or discharge tolerates gentle internal and external rotation about the hip without sudden increase in pain, femoral and sciatic nerve function intact on exam.Imaging:  X-rays of the right hip:  Continued consolidation of the proximal femoral fracture fragments and maintain position of the femoral implant.  No evidence of migration.Formal Radiology Readings for the past week follow below:No results found.Assessment:  83 year old female 6 weeks status post revision total hip replacement for periprosthetic fracture.  ICD-10-CM  1. S/P total right hip arthroplasty  Z96.641 XR Hip Right AP and Lateral  Plan:The history, examination, and radiographic findings were reviewed in detail with the patient today, and the diagnostic and therapeutic management options were discussed in detail.  Overall doing well continue with activities as tolerated and follow up in 6 weeks for next postoperative check.  Holly Erickson, DOYale Orthopaedics and Rehabilitation	[address]	[id number]	[place], [place]  [address] 	[phone] 		Central Office Phone	[phone]		Fax Line	[phone]	 	Assistant (Christina)cc:  Patient Care Team:Grillo, Asberry Pian, [place] as PCP - General [1] Past Medical History:Diagnosis Date  Arrhythmia   Colon polyp   Constipation   Depression   Hyperlipidemia  [2] Past Surgical History:Procedure Laterality Date  APPENDECTOMY    BACK SURGERY    COLONOSCOPY  03/11/2018  [facility]  HERNIA REPAIR    HYSTERECTOMY    OOPHORECTOMY Bilateral   PARATHYROIDECTOMY    REPLACEMENT TOTAL HIP W/  RESURFACING IMPLANTS Left   REPLACEMENT TOTAL KNEE BILATERAL    RHINOPLASTY    ROTATOR CUFF REPAIR Right   TONSILLECTOMY   [3] AllergiesAllergen Reactions  Fentanyl  Dizziness, Itching, Other (See Comments), Rash and Unknown   PATCH ONLY.. Oral form is oksweating duragesic  patch only; can tolerate IV Itching.  Duragesic  patch.sweating duragesic  patch only; can tolerate IV Itching.  Duragesic  patch.  PATCH ONLY.. Oral form is ok  Nsaids (Non-Steroidal Anti-Inflammatory Drug) Diarrhea and Other (See Comments)   Abdominal cramping. horrible cramps.  Sodium Pentothal [Thiopental Sodium]    Childhood

## 2024-10-07 ENCOUNTER — Encounter: Admit: 2024-10-07 | Payer: PRIVATE HEALTH INSURANCE | Attending: Geriatric Medicine

## 2024-10-08 ENCOUNTER — Ambulatory Visit: Admit: 2024-10-08 | Payer: PRIVATE HEALTH INSURANCE | Attending: Orthopedic

## 2024-10-08 DIAGNOSIS — M545 Low back pain, unspecified back pain laterality, unspecified chronicity, unspecified whether sciatica present: Principal | ICD-10-CM

## 2024-10-16 ENCOUNTER — Ambulatory Visit: Admit: 2024-10-16 | Payer: PRIVATE HEALTH INSURANCE

## 2024-10-23 ENCOUNTER — Ambulatory Visit: Admit: 2024-10-23 | Payer: PRIVATE HEALTH INSURANCE

## 2024-10-31 ENCOUNTER — Ambulatory Visit: Admit: 2024-10-31 | Payer: PRIVATE HEALTH INSURANCE

## 2024-11-01 ENCOUNTER — Ambulatory Visit: Admit: 2024-11-01 | Payer: PRIVATE HEALTH INSURANCE

## 2024-11-07 ENCOUNTER — Ambulatory Visit: Admit: 2024-11-07 | Payer: PRIVATE HEALTH INSURANCE

## 2024-11-13 ENCOUNTER — Ambulatory Visit: Admit: 2024-11-13 | Payer: PRIVATE HEALTH INSURANCE

## 2024-11-20 ENCOUNTER — Telehealth: Admit: 2024-11-20 | Payer: PRIVATE HEALTH INSURANCE | Attending: Geriatric Medicine

## 2024-11-20 ENCOUNTER — Encounter: Admit: 2024-11-20 | Payer: PRIVATE HEALTH INSURANCE

## 2024-11-21 NOTE — Telephone Encounter [36]
 I called Krystal (son); there was no answer; I left a voice message requesting a callback to complete the nursing intake process.

## 2024-11-21 NOTE — Telephone Encounter [36]
 I called Krystal again; there was no answer; I left another voice message requesting a callback.

## 2024-11-25 NOTE — Telephone Encounter [36]
 Son returned intake call. Asking for a call back

## 2024-11-26 NOTE — Telephone Encounter [36]
 I called and spoke to [facility]; however, he was unable to complete the intake. Krystal stated he would call back later today.

## 2024-11-26 NOTE — Telephone Encounter [36]
 Todd called back

## 2024-11-27 ENCOUNTER — Encounter: Admit: 2024-11-27 | Payer: PRIVATE HEALTH INSURANCE

## 2024-11-27 ENCOUNTER — Ambulatory Visit: Admit: 2024-11-27 | Payer: PRIVATE HEALTH INSURANCE | Attending: Geriatric Medicine

## 2024-11-27 VITALS — BP 133/70 | HR 93 | Wt 101.0 lb

## 2024-11-27 DIAGNOSIS — R296 Repeated falls: Secondary | ICD-10-CM

## 2024-11-27 DIAGNOSIS — G250 Essential tremor: Secondary | ICD-10-CM

## 2024-11-27 DIAGNOSIS — G629 Polyneuropathy, unspecified: Secondary | ICD-10-CM

## 2024-11-27 DIAGNOSIS — G3184 Mild cognitive impairment, so stated: Secondary | ICD-10-CM

## 2024-11-27 MED ORDER — MULTIVITAMIN WITH MINERALS TABLET
Freq: Every day | ORAL | 0.00 refills | 33.00000 days | Status: AC
Start: 2024-11-27 — End: ?

## 2024-11-27 MED ORDER — CALCIUM 600 MG-D3 20 MCG-MAGNESIUM 50 MG-ZN-COPPER-MANGAN-BORON TABLET
600 | ORAL | 0.00 refills | 30.00000 days | Status: AC
Start: 2024-11-27 — End: ?

## 2024-11-27 MED ORDER — GABAPENTIN 300 MG CAPSULE
300 | Freq: Every evening | ORAL | 1.00 refills | 30.00000 days | Status: AC
Start: 2024-11-27 — End: ?

## 2024-11-27 NOTE — Patient Instructions [37]
 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 There are multiple things you can do to protect your brain health and reduce your risk of developing dementia in the future. --Recommend hearing aids; wear as many waking hours as possible. This is one of the best things you can do for your brain health. [employer] may have discounts. Can also consider Air pod pro2 clinical hearing aid feature (less expensive). We also recommend getting a [employer]. Please see Audiology.--Please use the lowest dose needed of gabapentin , as this contributes to risk of fall and cognitive impairment --Please try to reduce smoking to the minimal amount possible --Referral to speech therapy for cognitive skills --Referral placed to physical and occupational therapy for fall prevention and modifications for the tremor [facility] [place]      [address]    [place], [place] [address]    Phone: [phone]  Patient Education How to Help Your Memory About this topic All people forget things at times. For some people, it is frustrating. Memory does decline with age, but sometimes it is a sign of a more serious problem such as depression or dementia. If your memory is getting worse and becoming a problem, it is important to talk about it with your doctor. There are many different things that you can do to help your memory.General Keep your brain active.Working on crossword puzzles or games that make you think about finding words or current events helps to keep your brain cells active.Learn a new skill. Challenge yourself to learn something new. Learn to knit, work with wood, play an instrument, or use a computer.Practice paying attention. Teach yourself to remember details, like what color was someone wearing? Where were you when visiting with people?Group the information.Most people can remember between 5 and 9 things at one time.If you have to remember more than that, break it down into smaller groups.Try to group like things together, such as numbers that form a special date or year or words that start with the same letter.Get organized.Write things down and make lists.Use paper, a calendar, or an electric device to keep track of your lists. Cross things off when you finish a task.Set an alarm on your phone or watch.Put your keys, wallet, purse, and cell phone in the same place where you see them often.Get rid of clutter. Clean, organized surroundings can help you stay focused and remember better.Talk about it.Say names and things you need to remember out loud.Connect the item with a story to help you remember.Use word associations to help you remember.Use all of your senses.Underline important facts.Keep a notebook to write down ideas you want to remember.Associate ideas with color, sounds, or smells.Make a picture in your mind of what you are trying to remember. If you have a doctor's appointment, picture a thermometer in your head.Try different memory techniques.Take the first letter of each word to make a different word. If you need to remember to buy Milk, Orange juice, and Pickles at the store, keep saying the word MOP in your head or out loud.Try rhyming to remember a name. Ed likes the color red.Be social.Volunteer. Spend time helping other people.Spend time with family or friends.Take good care of your body.Don't smoke. If you are a smoker, ask your doctor for help in quitting.Don't drink beer, wine, and mixed drinks (alcohol).Find out what a healthy weight is for you and eat a variety of foods to keep a healthy weight. Leafy green vegetables, fruits, salmon and tuna, lean meats, and  whole grain breads or cereals are good choices. Avoid sugar.See your doctor on a regular basis. Ask your doctor what you can do to manage your blood pressure, cholesterol, and blood sugar.Exercise often. Even a short walk each day can help.Sleep well.Try to get 7 to 8 hours of sleep each night.When do I need to call the doctor? Call the doctor if you or a loved nwz:Qnmhzud the names of close friends or familyCannot keep track of drugs and may take too little or too muchIs not aware of a problem with memory lossCannot recall common words or uses odd words in place of a common wordGets lost in places often visited or is not able to find the way homeLeaves the stove onTakes longer to do familiar tasks, such as following a recipePuts things in odd placesHas rapid mood swingsHas trouble making decisionsHas worry about memory loss or memory loss is causing problems each dayIs feeling sad, down, or depressedIs feeling that life isn?t worth livingLast Reviewed Date 2021-07-22Consumer Information Use and Disclaimer This generalized information is a limited summary of diagnosis, treatment, and/or medication information. It is not meant to be comprehensive and should be used as a tool to help the user understand and/or assess potential diagnostic and treatment options. It does NOT include all information about conditions, treatments, medications, side effects, or risks that may apply to a specific patient. It is not intended to be medical advice or a substitute for the medical advice, diagnosis, or treatment of a health care provider based on the health care provider's examination and assessment of a patient?s specific and unique circumstances. Patients must speak with a health care provider for complete information about their health, medical questions, and treatment options, including any risks or benefits regarding use of medications. This information does not endorse any treatments or medications as safe, effective, or approved for treating a specific patient. UpToDate, Inc. and its affiliates disclaim any warranty or liability relating to this information or the use thereof. The use of this information is governed by the Terms of Use, available at https://www.wolterskluwer.com/en/know/clinical-effectiveness-terms Copyright Copyright ? 2025 [employer]ToDate, Inc. and its affiliates and/or licensors. All rights reserved.Patient Education Mild cognitive impairment The Basics Written by the doctors and editors at UpToDateWhat is mild cognitive impairment? People with mild cognitive impairment, or MCI, have trouble with memory or thinking. Cognitive means related to memory and thinking. Impairment means having trouble doing something.It is normal for adults to have slight memory problems as they get older. But the problems in MCI are more significant than those of normal aging. For example, you might forget things more often than before, or you might forget more important things.Some people with MCI later develop a condition called dementia. This is a group of brain disorders that cause more serious problems with memory and thinking. People with MCI usually don't have many problems doing their daily tasks and activities. But people with dementia might not be able to do their daily tasks or activities correctly or at all.What are the symptoms of MCI? Memory problems are the most common symptom. Some people have other types of thinking problems, like trouble concentrating, reasoning, or remembering words. It can also be more difficult to make decisions. Some people might get lost.Some people might also feel sad, worried, or angry, act in a threatening way, or believe things that aren't true. Other people might seem less interested in activities or other things that used to be important to them.Some people are aware of their memory changes and problems.  In other cases, family members, friends, or coworkers might notice the problems first.How can my doctor or nurse tell if I have MCI? To tell if you have MCI, your doctor or nurse will: Talk with you and your family - They will ask about your symptoms, behavior, mood, and daily activities, and how these have changed over time.They will also ask about your medical conditions and medicines. That's because some medical conditions and medicines can also cause memory problems. In older adults, depression can cause symptoms similar to MCI. Do an exam Ask questions to check your memory and thinkingYour doctor might do tests to check if another medical condition is causing your symptoms. These tests can include: Blood tests [place] or MRI scan of your brain - These are imaging tests that create pictures of your brain. More detailed tests to check your memory, language, and thinking - This is called neuropsychological testing. It is used to learn more about how your brain and mind are working. It involves testing how well you can: Speak Write and understand language Learn and remember information Use reason and logic Do tasks related to math and numbersThis testing can take 1 to several hours. It is usually done by a doctor called a neuropsychologist. Other tests - If your doctor suspects you have a type of demential called Alzheimer disease, they might suggest additional tests.How is MCI treated? As of now, no medicines can prevent MCI from turning into dementia.If you have a lot of memory problems, your doctor might prescribe a medicine used to treat Alzheimer disease. This might help with your symptoms. Plus, your doctor might suggest you avoid certain medicines, which can impair thinking.If you are diagnosed with Alzheimer disease, you might be able to try 1 of the newer treatments that are available. These do not cure Alzheimer disease, but can slow it down somewhat. Your doctor can talk to you about whether these treatments might be an option, as well as their risks.Your doctor will also talk with you about ways to keep your brain as healthy as possible. These include: Getting regular exercise Having an active social life Keeping your brain busy and active Making sure your blood pressure is not too highDo people with MCI always get dementia? No. Although some people with MCI get dementia later on, not all do.There is no way to know which people with MCI will get dementia. That's why it's important to have regular follow-ups with your doctor. They can follow your symptoms to see if they change or get worse. If your symptoms do get worse, this is a sign you are more likely to develop dementia. Although most forms of dementia cannot be treated, it can help to know you are at risk. For example, if you know your problems might affect certain daily tasks or activities, you can get help in those areas. Plus, if you know your condition is not likely to improve, you and your family can make plans for the future.All topics are updated as new evidence becomes available and our peer review process is complete.This topic retrieved from UpToDate on: Jan 07, 2024.Topic 16423 Version 9.0Release: 33.1.2 - C33.61? 2025 UpToDate, Inc. and/or its affiliates. All rights reserved.Engineer, Mining: This generalized information is a limited summary of diagnosis, treatment, and/or medication information. It is not meant to be comprehensive and should be used as a tool to help the user understand and/or assess potential diagnostic and treatment options. It does NOT include all information about conditions, treatments,  medications, side effects, or risks that may apply to a specific patient. It is not intended to be medical advice or a substitute for the medical advice, diagnosis, or treatment of a health care provider based on the health care provider's examination and assessment of a patient's specific and unique circumstances. Patients must speak with a health care provider for complete information about their health, medical questions, and treatment options, including any risks or benefits regarding use of medications. This information does not endorse any treatments or medications as safe, effective, or approved for treating a specific patient. UpToDate, Inc. and its affiliates disclaim any warranty or liability relating to this information or the use thereof.The use of this information is governed by the Terms of Use, available at https://www.wolterskluwer.com/en/know/clinical-effectiveness-terms. 2025? UpToDate, Inc. and its affiliates and/or licensors. All rights reserved.Copyright ? 2025 UpToDate, Inc. and/or its affiliates. All rights reserved.Patient Education Good sleep hygiene The Basics Written by the doctors and editors at UpToDateWhat is sleep hygiene? Sleep hygiene is the term doctors use for habits that help you get good-quality sleep. Good-quality sleep means getting sleep that is long enough and is restful.The things we do throughout the day and before bed can affect how well we sleep. Sometimes, we do things that might make our sleep worse without realizing it. Good sleep hygiene is about understanding how to get better sleep.Why is sleep important? You need sleep to feel awake during the day and to be alert enough to do your normal activities. It's also important for keeping your body healthy. For example, sleep: Helps you learn information and form memories - As you sleep, the brain processes and stores information and experiences from when you were awake. Can help lower your risk of getting sick, or help you get better faster if you are sick Helps babies and children grow - This is because the body releases more growth hormone during sleep than during the day. Helps children and adults build muscles and repair cells and tissues in the bodyNot getting enough sleep can have negative effects. These can include: Feeling irritable, anxious, or depressed Relationship problems, especially for children and teens An increased risk of high blood pressure, heart disease, stroke, kidney disease, obesity, and type 2 diabetesHow much sleep do I need? It depends on your age, lifestyle, and general health. Your doctor or nurse can help you understand exactly how much sleep you or your child needs.The general recommendations for sleep are: Newborns (0 to 3 months) - 14 to 17 hours a day Infants (4 to 11 months) - 12 to 15 hours a day Toddlers (1 to 2 years) - 11 to 14 hours a day Preschool-aged children (3 to 5 years) - 10 to 13 hours a day School-aged children (6 to 13 years) - 9 to 11 hours a day Teens (14 to 17 years) - 8 to 10 hours a day Adults (26 to 64 years) - 7 to 9 hours a day Older adults (65 years or older) - 7 to 8 hours a dayIf you do not get enough sleep each night, you build up a sleep debt. For example, if a school-aged child gets 8 hours of sleep instead of 10, they have a sleep debt of 2 hours. People with a sleep debt might need more than the recommended number of hours of sleep until they catch up. Catching up on sleep can be difficult. The healthiest thing to do is to sleep the recommended number of hours each night. But if this is  not possible because of your job or other responsibilities, try to catch up on sleep when you can.Remember that the length of sleep is not the only thing that makes for good sleep. The quality of sleep is just as important as the number of hours. This is because your brain and body move through sleep cycles as you sleep. Each cycle has a different purpose. If you do not sleep deeply, or if you wake up often throughout the night, you might not get enough time in each sleep cycle.How can I get better sleep? Having good sleep hygiene means that you: Go to bed and get up at about the same time every day. Have coffee, tea, and other drinks or foods with caffeine only in the morning. Avoid eating close to bedtime - Try not to eat a large meal soon before you go to bed. It is better to eat a healthy and filling (but not too heavy) meal in the early evening. Try to avoid late-night snacking. Avoid alcohol in the late afternoon, evening, and bedtime. Avoid smoking, especially in the evening. Keep your bedroom dark, cool, quiet, and free of reminders of work or other things that cause you stress. Try to solve problems before you go to bed. Get plenty of physical activity, but not right before bed - Exercising 4 to 6 hours before bedtime has the best impact on sleep. Avoid looking at phones or reading devices (e-books) that give off light before bed - This can make it harder to fall asleep. There is not good evidence that wearing special blue light-filtering glasses works to improve sleep. Keep the place where you sleep quiet and dark - If needed, you can use a white noise machine or ear plugs to block out sound. Blackout curtains or an eye mask can help block out light. Do not check the time at night - Try to keep alarm clocks, watches, or smartphones out of your line of sight. Checking the time in the middle of the night can make you feel more awake, and can make it hard to fall back asleep. Avoid long naps if you have trouble sleeping at night, especially in the late afternoon. Short naps (about 20 minutes) can be helpful, especially if your work schedule changes day to day and you need to be alert at different times.What if I am having trouble sleeping? Everyone has a bad night of sleep sometimes. But if you regularly have trouble with sleep, see your doctor or nurse. They can check to see if you have a sleep disorder. Then, they can work with you to treat the problem.Call for advice if you or your child: Regularly do not get the recommended amount of sleep Have a lot of trouble waking up in the morning Feel tired or have trouble focusing during the day Wake up often throughout the night and can't get back to sleep Have a lot of trouble falling asleep at night Have other problems related to or caused by sleepAll topics are updated as new evidence becomes available and our peer review process is complete.This topic retrieved from UpToDate on: Jan 07, 2024.Topic [id number] Version 2.0Release: 33.1.2 - C33.61? 2025 UpToDate, Inc. and/or its affiliates. All rights reserved.Engineer, Mining: This generalized information is a limited summary of diagnosis, treatment, and/or medication information. It is not meant to be comprehensive and should be used as a tool to help the user understand and/or assess potential diagnostic and treatment options. It does NOT include  all information about conditions, treatments, medications, side effects, or risks that may apply to a specific patient. It is not intended to be medical advice or a substitute for the medical advice, diagnosis, or treatment of a health care provider based on the health care provider's examination and assessment of a patient's specific and unique circumstances. Patients must speak with a health care provider for complete information about their health, medical questions, and treatment options, including any risks or benefits regarding use of medications. This information does not endorse any treatments or medications as safe, effective, or approved for treating a specific patient. UpToDate, Inc. and its affiliates disclaim any warranty or liability relating to this information or the use thereof.The use of this information is governed by the Terms of Use, available at https://www.wolterskluwer.com/en/know/clinical-effectiveness-terms. 2025? UpToDate, Inc. and its affiliates and/or licensors. All rights reserved.Copyright ? 2025 UpToDate, Inc. and/or its affiliates. All rights reserved.
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
## 2024-11-29 ENCOUNTER — Ambulatory Visit: Admit: 2024-11-29 | Payer: PRIVATE HEALTH INSURANCE

## 2024-12-04 ENCOUNTER — Encounter: Admit: 2024-12-04 | Payer: PRIVATE HEALTH INSURANCE

## 2024-12-08 ENCOUNTER — Telehealth: Admit: 2024-12-08 | Payer: PRIVATE HEALTH INSURANCE | Attending: Geriatric Medicine

## 2024-12-08 NOTE — Telephone Encounter [36]
 Copied from CRM #[id number]. Topic: [facility] General CRM - General Inquiry>> Dec 08, 2024 11:46 AM Holly Erickson wrote:[place], Chynah called regarding case manager.Patient  called requesting a call back She stated she need to talk to her case worker AsapPatient can be reached [phone]

## 2024-12-08 NOTE — Progress Notes [1]
 Error

## 2024-12-08 NOTE — Telephone Encounter [36]
 I called and spoke to Holly Erickson (patient) who reports that she decreased the gabapentin  as Dr. Delray recommended. She used to take gabapentin  300 mg (100 mg before supper, during supper, and at bedtime). Holly Erickson then decreased the gabapentin  to 200 mg and took the dose at bedtime. She tried the lower dose for 3 nights in a row, she had trouble getting to sleep at all, and maybe had 2 hours of sleep one night. Holly Erickson increased the gabapentin  back to 300 mg and continues taking the melatonin as well. Holly Erickson is desperate to wean off the gabapentin ; however, she is requesting something to help her sleep as well as she weans off the gabapentin .

## 2025-01-22 ENCOUNTER — Encounter: Admit: 2025-01-22 | Payer: PRIVATE HEALTH INSURANCE

## 2025-05-27 ENCOUNTER — Ambulatory Visit: Admit: 2025-05-27 | Payer: PRIVATE HEALTH INSURANCE | Attending: Geriatric Medicine
